# Patient Record
Sex: Female | Born: 1940 | Race: White | Hispanic: No | Marital: Married | Smoking: Current every day smoker
Health system: Southern US, Community
[De-identification: ages and names within clinical notes are randomized; demographics above are authoritative.]

## PROBLEM LIST (undated history)

## (undated) DIAGNOSIS — K635 Polyp of colon: Secondary | ICD-10-CM

## (undated) DIAGNOSIS — H409 Unspecified glaucoma: Secondary | ICD-10-CM

## (undated) DIAGNOSIS — I2510 Atherosclerotic heart disease of native coronary artery without angina pectoris: Secondary | ICD-10-CM

## (undated) DIAGNOSIS — C189 Malignant neoplasm of colon, unspecified: Secondary | ICD-10-CM

## (undated) DIAGNOSIS — I5042 Chronic combined systolic (congestive) and diastolic (congestive) heart failure: Secondary | ICD-10-CM

## (undated) DIAGNOSIS — I6529 Occlusion and stenosis of unspecified carotid artery: Secondary | ICD-10-CM

## (undated) DIAGNOSIS — I480 Paroxysmal atrial fibrillation: Secondary | ICD-10-CM

## (undated) DIAGNOSIS — G8929 Other chronic pain: Secondary | ICD-10-CM

## (undated) DIAGNOSIS — E042 Nontoxic multinodular goiter: Secondary | ICD-10-CM

## (undated) DIAGNOSIS — I82409 Acute embolism and thrombosis of unspecified deep veins of unspecified lower extremity: Secondary | ICD-10-CM

## (undated) DIAGNOSIS — D509 Iron deficiency anemia, unspecified: Secondary | ICD-10-CM

## (undated) DIAGNOSIS — E1142 Type 2 diabetes mellitus with diabetic polyneuropathy: Secondary | ICD-10-CM

## (undated) DIAGNOSIS — Z950 Presence of cardiac pacemaker: Secondary | ICD-10-CM

## (undated) DIAGNOSIS — I10 Essential (primary) hypertension: Secondary | ICD-10-CM

## (undated) DIAGNOSIS — E785 Hyperlipidemia, unspecified: Secondary | ICD-10-CM

## (undated) DIAGNOSIS — Z8619 Personal history of other infectious and parasitic diseases: Secondary | ICD-10-CM

## (undated) DIAGNOSIS — M545 Low back pain, unspecified: Secondary | ICD-10-CM

## (undated) DIAGNOSIS — E119 Type 2 diabetes mellitus without complications: Secondary | ICD-10-CM

## (undated) DIAGNOSIS — M810 Age-related osteoporosis without current pathological fracture: Secondary | ICD-10-CM

## (undated) DIAGNOSIS — G4730 Sleep apnea, unspecified: Secondary | ICD-10-CM

## (undated) DIAGNOSIS — M1990 Unspecified osteoarthritis, unspecified site: Secondary | ICD-10-CM

## (undated) DIAGNOSIS — I471 Supraventricular tachycardia: Secondary | ICD-10-CM

## (undated) DIAGNOSIS — E039 Hypothyroidism, unspecified: Secondary | ICD-10-CM

## (undated) HISTORY — PX: ULNAR TUNNEL RELEASE: SHX820

## (undated) HISTORY — DX: Polyp of colon: K63.5

## (undated) HISTORY — DX: Atherosclerotic heart disease of native coronary artery without angina pectoris: I25.10

## (undated) HISTORY — DX: Type 2 diabetes mellitus with diabetic polyneuropathy: E11.42

## (undated) HISTORY — DX: Unspecified glaucoma: H40.9

## (undated) HISTORY — PX: UMBILICAL HERNIA REPAIR: SHX196

## (undated) HISTORY — PX: EYE MUSCLE SURGERY: SHX370

## (undated) HISTORY — DX: Essential (primary) hypertension: I10

## (undated) HISTORY — DX: Occlusion and stenosis of unspecified carotid artery: I65.29

## (undated) HISTORY — DX: Hyperlipidemia, unspecified: E78.5

## (undated) HISTORY — DX: Age-related osteoporosis without current pathological fracture: M81.0

## (undated) HISTORY — DX: Malignant neoplasm of colon, unspecified: C18.9

---

## 1979-05-01 HISTORY — PX: CHOLECYSTECTOMY: SHX55

## 2000-03-30 ENCOUNTER — Inpatient Hospital Stay (HOSPITAL_COMMUNITY): Admission: EM | Admit: 2000-03-30 | Discharge: 2000-04-01 | Payer: Self-pay | Admitting: Emergency Medicine

## 2000-03-30 ENCOUNTER — Encounter: Payer: Self-pay | Admitting: Emergency Medicine

## 2000-04-01 ENCOUNTER — Encounter: Payer: Self-pay | Admitting: Interventional Cardiology

## 2000-11-01 ENCOUNTER — Encounter: Admission: RE | Admit: 2000-11-01 | Discharge: 2001-01-30 | Payer: Self-pay | Admitting: Internal Medicine

## 2001-02-03 ENCOUNTER — Encounter: Payer: Self-pay | Admitting: Orthopedic Surgery

## 2001-02-03 ENCOUNTER — Ambulatory Visit (HOSPITAL_COMMUNITY): Admission: RE | Admit: 2001-02-03 | Discharge: 2001-02-03 | Payer: Self-pay | Admitting: Orthopedic Surgery

## 2002-11-07 ENCOUNTER — Encounter (HOSPITAL_BASED_OUTPATIENT_CLINIC_OR_DEPARTMENT_OTHER): Admission: RE | Admit: 2002-11-07 | Discharge: 2003-02-05 | Payer: Self-pay | Admitting: Internal Medicine

## 2003-01-25 ENCOUNTER — Encounter: Payer: Self-pay | Admitting: Internal Medicine

## 2003-01-25 ENCOUNTER — Encounter: Admission: RE | Admit: 2003-01-25 | Discharge: 2003-01-25 | Payer: Self-pay | Admitting: Internal Medicine

## 2004-08-30 HISTORY — PX: CARDIAC CATHETERIZATION: SHX172

## 2007-08-31 HISTORY — PX: ROUX-EN-Y GASTRIC BYPASS: SHX1104

## 2010-10-29 HISTORY — PX: COLECTOMY: SHX59

## 2010-11-29 HISTORY — PX: PORTACATH PLACEMENT: SHX2246

## 2011-08-31 DIAGNOSIS — I82409 Acute embolism and thrombosis of unspecified deep veins of unspecified lower extremity: Secondary | ICD-10-CM

## 2011-08-31 HISTORY — PX: CATARACT EXTRACTION, BILATERAL: SHX1313

## 2011-08-31 HISTORY — DX: Acute embolism and thrombosis of unspecified deep veins of unspecified lower extremity: I82.409

## 2011-09-13 DIAGNOSIS — M2010 Hallux valgus (acquired), unspecified foot: Secondary | ICD-10-CM | POA: Diagnosis not present

## 2011-09-13 DIAGNOSIS — M79609 Pain in unspecified limb: Secondary | ICD-10-CM | POA: Diagnosis not present

## 2011-09-13 DIAGNOSIS — IMO0001 Reserved for inherently not codable concepts without codable children: Secondary | ICD-10-CM | POA: Diagnosis not present

## 2011-09-13 DIAGNOSIS — E1149 Type 2 diabetes mellitus with other diabetic neurological complication: Secondary | ICD-10-CM | POA: Diagnosis not present

## 2011-09-13 DIAGNOSIS — M25579 Pain in unspecified ankle and joints of unspecified foot: Secondary | ICD-10-CM | POA: Diagnosis not present

## 2011-09-16 DIAGNOSIS — C185 Malignant neoplasm of splenic flexure: Secondary | ICD-10-CM | POA: Diagnosis not present

## 2011-10-05 DIAGNOSIS — E119 Type 2 diabetes mellitus without complications: Secondary | ICD-10-CM | POA: Diagnosis not present

## 2011-10-05 DIAGNOSIS — E039 Hypothyroidism, unspecified: Secondary | ICD-10-CM | POA: Diagnosis not present

## 2011-10-05 DIAGNOSIS — F329 Major depressive disorder, single episode, unspecified: Secondary | ICD-10-CM | POA: Diagnosis not present

## 2011-10-20 DIAGNOSIS — M79609 Pain in unspecified limb: Secondary | ICD-10-CM | POA: Diagnosis not present

## 2011-10-20 DIAGNOSIS — E119 Type 2 diabetes mellitus without complications: Secondary | ICD-10-CM | POA: Diagnosis not present

## 2011-10-20 DIAGNOSIS — E039 Hypothyroidism, unspecified: Secondary | ICD-10-CM | POA: Diagnosis not present

## 2011-10-20 DIAGNOSIS — I824Y9 Acute embolism and thrombosis of unspecified deep veins of unspecified proximal lower extremity: Secondary | ICD-10-CM | POA: Diagnosis not present

## 2011-10-20 DIAGNOSIS — I2510 Atherosclerotic heart disease of native coronary artery without angina pectoris: Secondary | ICD-10-CM | POA: Diagnosis not present

## 2011-10-20 DIAGNOSIS — M7989 Other specified soft tissue disorders: Secondary | ICD-10-CM | POA: Diagnosis not present

## 2011-10-20 DIAGNOSIS — I82409 Acute embolism and thrombosis of unspecified deep veins of unspecified lower extremity: Secondary | ICD-10-CM | POA: Diagnosis not present

## 2011-10-25 DIAGNOSIS — E785 Hyperlipidemia, unspecified: Secondary | ICD-10-CM | POA: Diagnosis not present

## 2011-10-25 DIAGNOSIS — I82409 Acute embolism and thrombosis of unspecified deep veins of unspecified lower extremity: Secondary | ICD-10-CM | POA: Diagnosis not present

## 2011-10-25 DIAGNOSIS — D649 Anemia, unspecified: Secondary | ICD-10-CM | POA: Diagnosis not present

## 2011-10-28 DIAGNOSIS — D509 Iron deficiency anemia, unspecified: Secondary | ICD-10-CM | POA: Diagnosis not present

## 2011-10-28 DIAGNOSIS — E119 Type 2 diabetes mellitus without complications: Secondary | ICD-10-CM | POA: Diagnosis not present

## 2011-10-28 DIAGNOSIS — Z86718 Personal history of other venous thrombosis and embolism: Secondary | ICD-10-CM | POA: Diagnosis not present

## 2011-10-28 DIAGNOSIS — I82409 Acute embolism and thrombosis of unspecified deep veins of unspecified lower extremity: Secondary | ICD-10-CM | POA: Diagnosis not present

## 2011-11-02 DIAGNOSIS — I82409 Acute embolism and thrombosis of unspecified deep veins of unspecified lower extremity: Secondary | ICD-10-CM | POA: Diagnosis not present

## 2011-11-02 DIAGNOSIS — E785 Hyperlipidemia, unspecified: Secondary | ICD-10-CM | POA: Diagnosis not present

## 2011-11-02 DIAGNOSIS — R252 Cramp and spasm: Secondary | ICD-10-CM | POA: Diagnosis not present

## 2011-11-02 DIAGNOSIS — Z794 Long term (current) use of insulin: Secondary | ICD-10-CM | POA: Diagnosis not present

## 2011-11-02 DIAGNOSIS — E669 Obesity, unspecified: Secondary | ICD-10-CM | POA: Diagnosis not present

## 2011-11-02 DIAGNOSIS — F329 Major depressive disorder, single episode, unspecified: Secondary | ICD-10-CM | POA: Diagnosis not present

## 2011-11-02 DIAGNOSIS — Z9221 Personal history of antineoplastic chemotherapy: Secondary | ICD-10-CM | POA: Diagnosis not present

## 2011-11-02 DIAGNOSIS — E1142 Type 2 diabetes mellitus with diabetic polyneuropathy: Secondary | ICD-10-CM | POA: Diagnosis not present

## 2011-11-02 DIAGNOSIS — M79609 Pain in unspecified limb: Secondary | ICD-10-CM | POA: Diagnosis not present

## 2011-11-02 DIAGNOSIS — Z9884 Bariatric surgery status: Secondary | ICD-10-CM | POA: Diagnosis not present

## 2011-11-02 DIAGNOSIS — Z86718 Personal history of other venous thrombosis and embolism: Secondary | ICD-10-CM | POA: Diagnosis not present

## 2011-11-02 DIAGNOSIS — Z85038 Personal history of other malignant neoplasm of large intestine: Secondary | ICD-10-CM | POA: Diagnosis not present

## 2011-11-02 DIAGNOSIS — Z86010 Personal history of colonic polyps: Secondary | ICD-10-CM | POA: Diagnosis not present

## 2011-11-02 DIAGNOSIS — M1710 Unilateral primary osteoarthritis, unspecified knee: Secondary | ICD-10-CM | POA: Diagnosis not present

## 2011-11-02 DIAGNOSIS — E1149 Type 2 diabetes mellitus with other diabetic neurological complication: Secondary | ICD-10-CM | POA: Diagnosis not present

## 2011-11-02 DIAGNOSIS — H409 Unspecified glaucoma: Secondary | ICD-10-CM | POA: Diagnosis not present

## 2011-11-02 DIAGNOSIS — E039 Hypothyroidism, unspecified: Secondary | ICD-10-CM | POA: Diagnosis not present

## 2011-11-04 DIAGNOSIS — I82409 Acute embolism and thrombosis of unspecified deep veins of unspecified lower extremity: Secondary | ICD-10-CM | POA: Diagnosis not present

## 2011-11-05 DIAGNOSIS — Z1231 Encounter for screening mammogram for malignant neoplasm of breast: Secondary | ICD-10-CM | POA: Diagnosis not present

## 2011-11-09 DIAGNOSIS — C185 Malignant neoplasm of splenic flexure: Secondary | ICD-10-CM | POA: Diagnosis not present

## 2011-11-09 DIAGNOSIS — I82409 Acute embolism and thrombosis of unspecified deep veins of unspecified lower extremity: Secondary | ICD-10-CM | POA: Diagnosis not present

## 2011-11-09 DIAGNOSIS — C189 Malignant neoplasm of colon, unspecified: Secondary | ICD-10-CM | POA: Diagnosis not present

## 2011-11-18 DIAGNOSIS — K6389 Other specified diseases of intestine: Secondary | ICD-10-CM | POA: Diagnosis not present

## 2011-11-18 DIAGNOSIS — D129 Benign neoplasm of anus and anal canal: Secondary | ICD-10-CM | POA: Diagnosis not present

## 2011-11-18 DIAGNOSIS — D128 Benign neoplasm of rectum: Secondary | ICD-10-CM | POA: Diagnosis not present

## 2011-11-18 DIAGNOSIS — K620 Anal polyp: Secondary | ICD-10-CM | POA: Diagnosis not present

## 2011-11-18 DIAGNOSIS — K621 Rectal polyp: Secondary | ICD-10-CM | POA: Diagnosis not present

## 2011-11-18 DIAGNOSIS — Z85038 Personal history of other malignant neoplasm of large intestine: Secondary | ICD-10-CM | POA: Diagnosis not present

## 2011-11-24 DIAGNOSIS — I82409 Acute embolism and thrombosis of unspecified deep veins of unspecified lower extremity: Secondary | ICD-10-CM | POA: Diagnosis not present

## 2011-11-29 DIAGNOSIS — E119 Type 2 diabetes mellitus without complications: Secondary | ICD-10-CM | POA: Diagnosis not present

## 2011-11-29 DIAGNOSIS — D649 Anemia, unspecified: Secondary | ICD-10-CM | POA: Diagnosis not present

## 2011-11-29 DIAGNOSIS — I82409 Acute embolism and thrombosis of unspecified deep veins of unspecified lower extremity: Secondary | ICD-10-CM | POA: Diagnosis not present

## 2011-11-29 DIAGNOSIS — E782 Mixed hyperlipidemia: Secondary | ICD-10-CM | POA: Diagnosis not present

## 2011-11-29 DIAGNOSIS — R5381 Other malaise: Secondary | ICD-10-CM | POA: Diagnosis not present

## 2011-11-29 DIAGNOSIS — R5383 Other fatigue: Secondary | ICD-10-CM | POA: Diagnosis not present

## 2011-12-06 DIAGNOSIS — I82409 Acute embolism and thrombosis of unspecified deep veins of unspecified lower extremity: Secondary | ICD-10-CM | POA: Diagnosis not present

## 2011-12-14 DIAGNOSIS — I82409 Acute embolism and thrombosis of unspecified deep veins of unspecified lower extremity: Secondary | ICD-10-CM | POA: Diagnosis not present

## 2011-12-20 DIAGNOSIS — D649 Anemia, unspecified: Secondary | ICD-10-CM | POA: Diagnosis not present

## 2011-12-20 DIAGNOSIS — K289 Gastrojejunal ulcer, unspecified as acute or chronic, without hemorrhage or perforation: Secondary | ICD-10-CM | POA: Diagnosis not present

## 2011-12-20 DIAGNOSIS — K2960 Other gastritis without bleeding: Secondary | ICD-10-CM | POA: Diagnosis not present

## 2011-12-20 DIAGNOSIS — K5520 Angiodysplasia of colon without hemorrhage: Secondary | ICD-10-CM | POA: Diagnosis not present

## 2011-12-20 DIAGNOSIS — K599 Functional intestinal disorder, unspecified: Secondary | ICD-10-CM | POA: Diagnosis not present

## 2011-12-20 DIAGNOSIS — D509 Iron deficiency anemia, unspecified: Secondary | ICD-10-CM | POA: Diagnosis not present

## 2011-12-23 DIAGNOSIS — E785 Hyperlipidemia, unspecified: Secondary | ICD-10-CM | POA: Diagnosis not present

## 2011-12-23 DIAGNOSIS — I10 Essential (primary) hypertension: Secondary | ICD-10-CM | POA: Diagnosis not present

## 2011-12-23 DIAGNOSIS — E1165 Type 2 diabetes mellitus with hyperglycemia: Secondary | ICD-10-CM | POA: Diagnosis not present

## 2011-12-23 DIAGNOSIS — I2510 Atherosclerotic heart disease of native coronary artery without angina pectoris: Secondary | ICD-10-CM | POA: Diagnosis not present

## 2012-01-27 DIAGNOSIS — R030 Elevated blood-pressure reading, without diagnosis of hypertension: Secondary | ICD-10-CM | POA: Diagnosis not present

## 2012-01-27 DIAGNOSIS — E119 Type 2 diabetes mellitus without complications: Secondary | ICD-10-CM | POA: Diagnosis not present

## 2012-01-27 DIAGNOSIS — E785 Hyperlipidemia, unspecified: Secondary | ICD-10-CM | POA: Diagnosis not present

## 2012-01-27 DIAGNOSIS — D649 Anemia, unspecified: Secondary | ICD-10-CM | POA: Diagnosis not present

## 2012-02-02 DIAGNOSIS — D649 Anemia, unspecified: Secondary | ICD-10-CM | POA: Diagnosis not present

## 2012-02-02 DIAGNOSIS — E119 Type 2 diabetes mellitus without complications: Secondary | ICD-10-CM | POA: Diagnosis not present

## 2012-02-02 DIAGNOSIS — Z7901 Long term (current) use of anticoagulants: Secondary | ICD-10-CM | POA: Diagnosis not present

## 2012-02-04 DIAGNOSIS — C185 Malignant neoplasm of splenic flexure: Secondary | ICD-10-CM | POA: Diagnosis not present

## 2012-02-15 DIAGNOSIS — I82409 Acute embolism and thrombosis of unspecified deep veins of unspecified lower extremity: Secondary | ICD-10-CM | POA: Diagnosis not present

## 2012-02-16 DIAGNOSIS — K5732 Diverticulitis of large intestine without perforation or abscess without bleeding: Secondary | ICD-10-CM | POA: Diagnosis not present

## 2012-02-23 DIAGNOSIS — I82409 Acute embolism and thrombosis of unspecified deep veins of unspecified lower extremity: Secondary | ICD-10-CM | POA: Diagnosis not present

## 2012-03-08 DIAGNOSIS — I82409 Acute embolism and thrombosis of unspecified deep veins of unspecified lower extremity: Secondary | ICD-10-CM | POA: Diagnosis not present

## 2012-03-15 DIAGNOSIS — I82409 Acute embolism and thrombosis of unspecified deep veins of unspecified lower extremity: Secondary | ICD-10-CM | POA: Diagnosis not present

## 2012-03-21 DIAGNOSIS — H2510 Age-related nuclear cataract, unspecified eye: Secondary | ICD-10-CM | POA: Diagnosis not present

## 2012-03-21 DIAGNOSIS — H4010X0 Unspecified open-angle glaucoma, stage unspecified: Secondary | ICD-10-CM | POA: Diagnosis not present

## 2012-03-21 DIAGNOSIS — E109 Type 1 diabetes mellitus without complications: Secondary | ICD-10-CM | POA: Diagnosis not present

## 2012-03-29 DIAGNOSIS — I82409 Acute embolism and thrombosis of unspecified deep veins of unspecified lower extremity: Secondary | ICD-10-CM | POA: Diagnosis not present

## 2012-03-29 DIAGNOSIS — Z5181 Encounter for therapeutic drug level monitoring: Secondary | ICD-10-CM | POA: Diagnosis not present

## 2012-03-30 DIAGNOSIS — I82890 Acute embolism and thrombosis of other specified veins: Secondary | ICD-10-CM | POA: Diagnosis not present

## 2012-03-30 DIAGNOSIS — E119 Type 2 diabetes mellitus without complications: Secondary | ICD-10-CM | POA: Diagnosis not present

## 2012-03-30 DIAGNOSIS — M7989 Other specified soft tissue disorders: Secondary | ICD-10-CM | POA: Diagnosis not present

## 2012-03-30 DIAGNOSIS — I82409 Acute embolism and thrombosis of unspecified deep veins of unspecified lower extremity: Secondary | ICD-10-CM | POA: Diagnosis not present

## 2012-04-03 DIAGNOSIS — I82409 Acute embolism and thrombosis of unspecified deep veins of unspecified lower extremity: Secondary | ICD-10-CM | POA: Diagnosis not present

## 2012-04-03 DIAGNOSIS — M7989 Other specified soft tissue disorders: Secondary | ICD-10-CM | POA: Diagnosis not present

## 2012-04-11 DIAGNOSIS — Z86718 Personal history of other venous thrombosis and embolism: Secondary | ICD-10-CM | POA: Diagnosis not present

## 2012-04-17 DIAGNOSIS — H2510 Age-related nuclear cataract, unspecified eye: Secondary | ICD-10-CM | POA: Diagnosis not present

## 2012-04-17 DIAGNOSIS — E11319 Type 2 diabetes mellitus with unspecified diabetic retinopathy without macular edema: Secondary | ICD-10-CM | POA: Diagnosis not present

## 2012-04-17 DIAGNOSIS — H25049 Posterior subcapsular polar age-related cataract, unspecified eye: Secondary | ICD-10-CM | POA: Diagnosis not present

## 2012-04-19 DIAGNOSIS — Z86718 Personal history of other venous thrombosis and embolism: Secondary | ICD-10-CM | POA: Diagnosis not present

## 2012-04-26 DIAGNOSIS — Z7901 Long term (current) use of anticoagulants: Secondary | ICD-10-CM | POA: Diagnosis not present

## 2012-04-26 DIAGNOSIS — I82409 Acute embolism and thrombosis of unspecified deep veins of unspecified lower extremity: Secondary | ICD-10-CM | POA: Diagnosis not present

## 2012-05-02 DIAGNOSIS — E119 Type 2 diabetes mellitus without complications: Secondary | ICD-10-CM | POA: Diagnosis not present

## 2012-05-02 DIAGNOSIS — H269 Unspecified cataract: Secondary | ICD-10-CM | POA: Diagnosis not present

## 2012-05-02 DIAGNOSIS — E785 Hyperlipidemia, unspecified: Secondary | ICD-10-CM | POA: Diagnosis not present

## 2012-05-02 DIAGNOSIS — D649 Anemia, unspecified: Secondary | ICD-10-CM | POA: Diagnosis not present

## 2012-05-10 DIAGNOSIS — C185 Malignant neoplasm of splenic flexure: Secondary | ICD-10-CM | POA: Diagnosis not present

## 2012-05-10 DIAGNOSIS — C189 Malignant neoplasm of colon, unspecified: Secondary | ICD-10-CM | POA: Diagnosis not present

## 2012-05-10 DIAGNOSIS — D539 Nutritional anemia, unspecified: Secondary | ICD-10-CM | POA: Diagnosis not present

## 2012-05-17 DIAGNOSIS — Z7901 Long term (current) use of anticoagulants: Secondary | ICD-10-CM | POA: Diagnosis not present

## 2012-05-17 DIAGNOSIS — I82409 Acute embolism and thrombosis of unspecified deep veins of unspecified lower extremity: Secondary | ICD-10-CM | POA: Diagnosis not present

## 2012-05-22 DIAGNOSIS — D509 Iron deficiency anemia, unspecified: Secondary | ICD-10-CM | POA: Diagnosis not present

## 2012-06-05 DIAGNOSIS — D509 Iron deficiency anemia, unspecified: Secondary | ICD-10-CM | POA: Diagnosis not present

## 2012-06-05 DIAGNOSIS — Z23 Encounter for immunization: Secondary | ICD-10-CM | POA: Diagnosis not present

## 2012-06-07 DIAGNOSIS — H409 Unspecified glaucoma: Secondary | ICD-10-CM | POA: Diagnosis not present

## 2012-06-07 DIAGNOSIS — H4010X0 Unspecified open-angle glaucoma, stage unspecified: Secondary | ICD-10-CM | POA: Diagnosis not present

## 2012-06-07 DIAGNOSIS — H4011X0 Primary open-angle glaucoma, stage unspecified: Secondary | ICD-10-CM | POA: Diagnosis not present

## 2012-06-07 DIAGNOSIS — H25049 Posterior subcapsular polar age-related cataract, unspecified eye: Secondary | ICD-10-CM | POA: Diagnosis not present

## 2012-06-07 DIAGNOSIS — H25019 Cortical age-related cataract, unspecified eye: Secondary | ICD-10-CM | POA: Diagnosis not present

## 2012-06-07 DIAGNOSIS — H2510 Age-related nuclear cataract, unspecified eye: Secondary | ICD-10-CM | POA: Diagnosis not present

## 2012-06-08 DIAGNOSIS — H2510 Age-related nuclear cataract, unspecified eye: Secondary | ICD-10-CM | POA: Diagnosis not present

## 2012-06-14 DIAGNOSIS — Z86718 Personal history of other venous thrombosis and embolism: Secondary | ICD-10-CM | POA: Diagnosis not present

## 2012-06-19 DIAGNOSIS — D509 Iron deficiency anemia, unspecified: Secondary | ICD-10-CM | POA: Diagnosis not present

## 2012-06-21 DIAGNOSIS — H25049 Posterior subcapsular polar age-related cataract, unspecified eye: Secondary | ICD-10-CM | POA: Diagnosis not present

## 2012-06-21 DIAGNOSIS — H409 Unspecified glaucoma: Secondary | ICD-10-CM | POA: Diagnosis not present

## 2012-06-21 DIAGNOSIS — H4011X0 Primary open-angle glaucoma, stage unspecified: Secondary | ICD-10-CM | POA: Diagnosis not present

## 2012-06-21 DIAGNOSIS — H2510 Age-related nuclear cataract, unspecified eye: Secondary | ICD-10-CM | POA: Diagnosis not present

## 2012-07-03 DIAGNOSIS — D509 Iron deficiency anemia, unspecified: Secondary | ICD-10-CM | POA: Diagnosis not present

## 2012-07-07 DIAGNOSIS — I2510 Atherosclerotic heart disease of native coronary artery without angina pectoris: Secondary | ICD-10-CM | POA: Diagnosis not present

## 2012-07-07 DIAGNOSIS — E1165 Type 2 diabetes mellitus with hyperglycemia: Secondary | ICD-10-CM | POA: Diagnosis not present

## 2012-07-07 DIAGNOSIS — E785 Hyperlipidemia, unspecified: Secondary | ICD-10-CM | POA: Diagnosis not present

## 2012-07-07 DIAGNOSIS — I10 Essential (primary) hypertension: Secondary | ICD-10-CM | POA: Diagnosis not present

## 2012-07-11 DIAGNOSIS — Z86718 Personal history of other venous thrombosis and embolism: Secondary | ICD-10-CM | POA: Diagnosis not present

## 2012-08-09 DIAGNOSIS — Z86718 Personal history of other venous thrombosis and embolism: Secondary | ICD-10-CM | POA: Diagnosis not present

## 2012-09-13 DIAGNOSIS — Z0189 Encounter for other specified special examinations: Secondary | ICD-10-CM | POA: Diagnosis not present

## 2012-09-13 DIAGNOSIS — Z85038 Personal history of other malignant neoplasm of large intestine: Secondary | ICD-10-CM | POA: Diagnosis not present

## 2012-11-03 DIAGNOSIS — D509 Iron deficiency anemia, unspecified: Secondary | ICD-10-CM | POA: Diagnosis not present

## 2012-12-12 DIAGNOSIS — C185 Malignant neoplasm of splenic flexure: Secondary | ICD-10-CM | POA: Diagnosis not present

## 2012-12-12 DIAGNOSIS — C189 Malignant neoplasm of colon, unspecified: Secondary | ICD-10-CM | POA: Diagnosis not present

## 2013-04-11 ENCOUNTER — Telehealth: Payer: Self-pay | Admitting: Oncology

## 2013-04-11 ENCOUNTER — Telehealth: Payer: Self-pay | Admitting: *Deleted

## 2013-04-11 NOTE — Telephone Encounter (Signed)
Spoke with patient by phone and confirmed appointment with Dr. Truett Perna for 04/26/13.  Patient is transferring care from [facility] referral received 04/10/13 pm.  She will bring a copy of her scans with her and other paper medical records she has.  Contact names and phone numbers for [facility] was provided.

## 2013-04-11 NOTE — Telephone Encounter (Signed)
Pt scheduled per Wilburn Mylar.  Welcome packet mailed.

## 2013-04-16 ENCOUNTER — Telehealth: Payer: Self-pay | Admitting: Oncology

## 2013-04-16 NOTE — Telephone Encounter (Signed)
C/D 04/16/13 for appt. 04/26/13 °

## 2013-04-26 ENCOUNTER — Ambulatory Visit (HOSPITAL_BASED_OUTPATIENT_CLINIC_OR_DEPARTMENT_OTHER): Payer: Medicare Other | Admitting: Oncology

## 2013-04-26 ENCOUNTER — Encounter: Payer: Self-pay | Admitting: Oncology

## 2013-04-26 ENCOUNTER — Ambulatory Visit: Payer: Medicare Other

## 2013-04-26 ENCOUNTER — Telehealth: Payer: Self-pay | Admitting: Oncology

## 2013-04-26 ENCOUNTER — Ambulatory Visit (HOSPITAL_BASED_OUTPATIENT_CLINIC_OR_DEPARTMENT_OTHER): Payer: Medicare Other | Admitting: Lab

## 2013-04-26 VITALS — BP 171/79 | HR 64 | Temp 98.3°F | Resp 20 | Ht 67.5 in | Wt 254.1 lb

## 2013-04-26 DIAGNOSIS — Z85038 Personal history of other malignant neoplasm of large intestine: Secondary | ICD-10-CM

## 2013-04-26 DIAGNOSIS — D649 Anemia, unspecified: Secondary | ICD-10-CM

## 2013-04-26 DIAGNOSIS — C189 Malignant neoplasm of colon, unspecified: Secondary | ICD-10-CM | POA: Diagnosis not present

## 2013-04-26 DIAGNOSIS — G609 Hereditary and idiopathic neuropathy, unspecified: Secondary | ICD-10-CM

## 2013-04-26 DIAGNOSIS — C184 Malignant neoplasm of transverse colon: Secondary | ICD-10-CM

## 2013-04-26 DIAGNOSIS — Z86718 Personal history of other venous thrombosis and embolism: Secondary | ICD-10-CM | POA: Diagnosis not present

## 2013-04-26 DIAGNOSIS — Z808 Family history of malignant neoplasm of other organs or systems: Secondary | ICD-10-CM | POA: Diagnosis not present

## 2013-04-26 DIAGNOSIS — E119 Type 2 diabetes mellitus without complications: Secondary | ICD-10-CM

## 2013-04-26 DIAGNOSIS — Z862 Personal history of diseases of the blood and blood-forming organs and certain disorders involving the immune mechanism: Secondary | ICD-10-CM

## 2013-04-26 LAB — CBC WITH DIFFERENTIAL/PLATELET
BASO%: 1 % (ref 0.0–2.0)
Basophils Absolute: 0.1 10*3/uL (ref 0.0–0.1)
EOS%: 1.2 % (ref 0.0–7.0)
Eosinophils Absolute: 0.1 10*3/uL (ref 0.0–0.5)
HCT: 42.2 % (ref 34.8–46.6)
HGB: 14.1 g/dL (ref 11.6–15.9)
LYMPH%: 29.9 % (ref 14.0–49.7)
MCH: 29.6 pg (ref 25.1–34.0)
MCHC: 33.6 g/dL (ref 31.5–36.0)
MCV: 88.3 fL (ref 79.5–101.0)
MONO#: 0.6 10*3/uL (ref 0.1–0.9)
MONO%: 5.7 % (ref 0.0–14.0)
NEUT#: 6.5 10*3/uL (ref 1.5–6.5)
NEUT%: 62.2 % (ref 38.4–76.8)
Platelets: 304 10*3/uL (ref 145–400)
RBC: 4.78 10*6/uL (ref 3.70–5.45)
RDW: 14.7 % — ABNORMAL HIGH (ref 11.2–14.5)
WBC: 10.4 10*3/uL — ABNORMAL HIGH (ref 3.9–10.3)
lymph#: 3.1 10*3/uL (ref 0.9–3.3)

## 2013-04-26 NOTE — Progress Notes (Signed)
[facility] New Patient Consult   Referring MD: Dr. Deatra Robinson   Mary [name] 72 y.o.  [birth year]    Reason for Referral: Colon cancer     HPI: She was diagnosed with colon cancer in 2012 while living in [place]. She recently relocated to [facility] and is referred to continue oncology care.  She underwent a screening colonoscopy on 11/13/2010. A mass was noted at the splenic flexure. She underwent a hand-assisted laparoscopic left colectomy on 11/30/2010. Tumor was noted at the distal transverse colon. No evidence of liver metastases. The pathology confirmed a low-grade adenocarcinoma of the transverse colon measuring 7 cm. Macroscopic tumor perforation was not identified. The resection margins were negative. Lymphovascular and perineural invasion were not identified. 1 tumor deposit was identified. 28 lymph nodes were negative for metastatic carcinoma.  She was treated with adjuvant FOLFOX beginning on 12/23/2010. She completed 12 cycles. She reports oxaliplatin was held after cycle 7 secondary to neuropathy.  She feels well. She last underwent a restaging evaluation in April 2014. CT scan showed no evidence of metastatic disease with a stable left lung nodule. She underwent a surveillance colonoscopy on 11/18/2011. A 4 mm polyp was removed from the rectum. The pathology revealed a tubular adenoma.  Past Medical History  Diagnosis Date  . Colon cancer-stage III   April 2012  . Diabetes mellitus without complication   . Hypertension   . Thyroid disease     hypothyroid  . Anemia     iron deficiency-felt to be secondary to gastric bypass surgery, status post IV iron therapy   . Diabetic peripheral neuropathy   . CAD (coronary artery disease)   . Hyperlipidemia     .  G0 P0   .  Glaucoma   .  Left leg deep vein thrombosis in March of 2013 and October 2013, initial DVT was following trauma to the left leg, she has been maintained off of anticoagulation since  December of 2013  Past Surgical History  Procedure Laterality Date  . Gastric bypass     .     Bilateral cataract surgery-2013  .     Partial colectomy-April 2012  Family history: A brother died of pancreatic cancer dates 48, a brother has prostate and bladder cancer, a sister died of breast cancer at age 48, her mother died of breast cancer at age 79, 2 maternal aunts died of breast cancer, a maternal cousin died of breast cancer, a maternal cousin has prostate cancer, a maternal cousin had ovarian and bladder cancer  No family history of rectal or colon cancer  Current outpatient prescriptions:atorvastatin (LIPITOR) 40 MG tablet, Take 40 mg by mouth daily., Disp: , Rfl: ;  bimatoprost (LUMIGAN) 0.01 % SOLN, Apply 1 drop to eye at bedtime. Both eyes, Disp: , Rfl: ;  buPROPion (WELLBUTRIN SR) 150 MG 12 hr tablet, Take 150 mg by mouth daily., Disp: , Rfl: ;  Calcium Citrate-Vitamin D (CITRACAL + D PO), Take 1 tablet by mouth 2 (two) times daily., Disp: , Rfl:  Cholecalciferol (VITAMIN D3) 10000 UNITS capsule, Take 50,000 Units by mouth daily., Disp: , Rfl: ;  Choline Fenofibrate 135 MG capsule, Take 135 mg by mouth daily., Disp: , Rfl: ;  cyanocobalamin (,VITAMIN B-12,) 1000 MCG/ML injection, Inject 1,000 mcg into the skin every 30 (thirty) days., Disp: , Rfl: ;  ferrous sulfate 325 (65 FE) MG tablet, Take 325 mg by mouth 2 (two) times daily., Disp: , Rfl:  gabapentin (NEURONTIN) 300 MG  capsule, Take 300 mg by mouth 3 (three) times daily., Disp: , Rfl: ;  HYDROcodone-acetaminophen (VICODIN) 5-500 MG per tablet, Take 1 tablet by mouth every 4 (four) hours as needed for pain., Disp: , Rfl: ;  insulin glargine (LANTUS) 100 UNIT/ML injection, Inject into the skin 2 (two) times daily as needed., Disp: , Rfl:  levothyroxine (SYNTHROID, LEVOTHROID) 175 MCG tablet, Take 175 mcg by mouth daily before breakfast., Disp: , Rfl: ;  lisinopril-hydrochlorothiazide (PRINZIDE,ZESTORETIC) 20-12.5 MG per tablet, Take 1  tablet by mouth daily., Disp: , Rfl: ;  metFORMIN (GLUCOPHAGE) 1000 MG tablet, Take 1,000 mg by mouth 2 (two) times daily with a meal., Disp: , Rfl: ;  Multiple Vitamins-Minerals (CENTRUM SILVER PO), Take 1 tablet by mouth daily., Disp: , Rfl:   Allergies:  Allergies  Allergen Reactions  . Nsaids     S/P gastric bypass-told not to take    Social History: She lives with her husband in [place]. She is retired from an office occupation. She quit smoking cigarettes in 2009 and restarted. No alcohol use. No transfusion history. No risk factor for HIV or hepatitis.    ROS:   Positives include: Persistent numbness and pain in the feet, intentional 20 pound weight loss, alternating diarrhea and constipation  A complete ROS was otherwise negative.  Physical Exam:  Blood pressure 171/79, pulse 64, temperature 98.3 F (36.8 C), temperature source Oral, resp. rate 20, height 5' 7.5" (1.715 m), weight 254 lb 1.6 oz (115.259 kg).  HEENT: Oropharynx without visible mass, small area of yellowish discoloration at the left tonsil, neck without mass Lungs: Clear bilaterally Cardiac: Regular rate and rhythm Abdomen: No hepatosplenomegaly, no mass, nontender  Vascular: Chronic stasis change with trace edema at the left greater than right lower leg Lymph nodes: No cervical, supra-clavicular, axillary, or inguinal nodes Neurologic: Alert and oriented, the motor exam appears intact in the upper and lower extremities Skin: No rash, Port-A-Cath site at the left upper chest without erythema or tenderness Musculoskeletal: No spine tenderness   LAB:  CBC  Lab Results  Component Value Date   WBC 10.4* 04/26/2013   HGB 14.1 04/26/2013   HCT 42.2 04/26/2013   MCV 88.3 04/26/2013   PLT 304 04/26/2013   ANC 6.5  CEA pending  Radiology: As per history of present illness    Assessment/Plan:   1. Stage III (pT3,pN1c,M0) adenocarcinoma transverse colon, status post a laparoscopic left colectomy  11/30/2010 -12 cycles of FOLFOX chemotherapy initiated on 12/23/2010, oxaliplatin was held after? Cycle 7 of chemotherapy secondary to neuropathy -Surveillance colonoscopy March 2013, removal of a rectal tubular adenoma -Negative surveillance CT scans 12/12/2012  2. History of iron deficiency anemia secondary gastric bypass surgery, status post Venofer, last given 07/03/2012  3. Gastric bypass surgery in 2009  4. Diabetes  5. Hypertension  6. 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 Extensive family history of cancer   Disposition:   [name] has a history of stage III colon cancer diagnosed in April of 2012. She remains in clinical remission. We will followup on the CEA from today. She will return for an office visit and CEA in 6 months. We will plan for a surveillance CT in April of 2015.  She has a history of iron deficiency anemia. The hemoglobin and MCV are normal today. We will check a CBC when she returns in 6 months.  We will make a  referral to Dr. Felicity Coyer for internal medicine care. We will make arrangements for removal of the Port-A-Cath.  She will be referred to the genetic screening clinic.    Paullette Mckain 04/26/2013, 5:58 PM

## 2013-04-26 NOTE — Telephone Encounter (Signed)
gave pt appt for March 2014 , 1st available for MD, sent pt to labs today

## 2013-04-26 NOTE — Progress Notes (Signed)
Checked in new pt with no financial concerns. °

## 2013-04-26 NOTE — Progress Notes (Signed)
Met with Mary [name]. Explained role of nurse navigator. Educational information provided on colon cancer along with [facility] resource sheet and phone numbers.  Smoking cessation information was provided. [facility] resources provided to patient, including SW service information.  Contact names and phone numbers were provided.  No barriers to care identified.  Will continue to follow as needed.

## 2013-04-28 ENCOUNTER — Encounter: Payer: Self-pay | Admitting: Oncology

## 2013-05-01 ENCOUNTER — Encounter: Payer: Self-pay | Admitting: Oncology

## 2013-05-02 ENCOUNTER — Encounter: Payer: Self-pay | Admitting: Oncology

## 2013-05-02 ENCOUNTER — Encounter (HOSPITAL_COMMUNITY): Payer: Self-pay | Admitting: Pharmacy Technician

## 2013-05-02 ENCOUNTER — Other Ambulatory Visit: Payer: Self-pay | Admitting: Radiology

## 2013-05-03 ENCOUNTER — Telehealth: Payer: Self-pay | Admitting: *Deleted

## 2013-05-03 NOTE — Telephone Encounter (Signed)
Called patient to clarify her message about genetic counseling appointment.  Mary [name] says "she forgot to type NOT in her message.  I'm living on social security and can't afford it.  I also know you can't be turned down for pre-existing conditions but know  There will be a way for insurance to turn things down."  Does not want any mention of counseling or genetic testing in her records.  Will notify providers to cancel this request.

## 2013-05-04 ENCOUNTER — Ambulatory Visit (HOSPITAL_COMMUNITY)
Admission: RE | Admit: 2013-05-04 | Discharge: 2013-05-04 | Disposition: A | Discharge status: Home / Self Care | Payer: Medicare Other | Source: Ambulatory Visit | Attending: Oncology | Admitting: Oncology

## 2013-05-04 ENCOUNTER — Encounter (HOSPITAL_COMMUNITY): Payer: Self-pay | Admitting: *Deleted

## 2013-05-04 ENCOUNTER — Encounter (HOSPITAL_COMMUNITY): Payer: Self-pay

## 2013-05-04 ENCOUNTER — Inpatient Hospital Stay (HOSPITAL_COMMUNITY)
Admission: AD | Admit: 2013-05-04 | Discharge: 2013-05-06 | DRG: 309 | Disposition: A | Discharge status: Home / Self Care | Payer: Medicare Other | Source: Ambulatory Visit | Attending: Interventional Cardiology | Admitting: Interventional Cardiology

## 2013-05-04 DIAGNOSIS — Z79899 Other long term (current) drug therapy: Secondary | ICD-10-CM

## 2013-05-04 DIAGNOSIS — I25118 Atherosclerotic heart disease of native coronary artery with other forms of angina pectoris: Secondary | ICD-10-CM | POA: Diagnosis present

## 2013-05-04 DIAGNOSIS — I2510 Atherosclerotic heart disease of native coronary artery without angina pectoris: Secondary | ICD-10-CM

## 2013-05-04 DIAGNOSIS — E119 Type 2 diabetes mellitus without complications: Secondary | ICD-10-CM | POA: Diagnosis not present

## 2013-05-04 DIAGNOSIS — Z86718 Personal history of other venous thrombosis and embolism: Secondary | ICD-10-CM | POA: Diagnosis not present

## 2013-05-04 DIAGNOSIS — Z85038 Personal history of other malignant neoplasm of large intestine: Secondary | ICD-10-CM | POA: Diagnosis not present

## 2013-05-04 DIAGNOSIS — F17200 Nicotine dependence, unspecified, uncomplicated: Secondary | ICD-10-CM | POA: Diagnosis present

## 2013-05-04 DIAGNOSIS — Z9884 Bariatric surgery status: Secondary | ICD-10-CM | POA: Diagnosis not present

## 2013-05-04 DIAGNOSIS — Z794 Long term (current) use of insulin: Secondary | ICD-10-CM

## 2013-05-04 DIAGNOSIS — I498 Other specified cardiac arrhythmias: Secondary | ICD-10-CM | POA: Diagnosis present

## 2013-05-04 DIAGNOSIS — C189 Malignant neoplasm of colon, unspecified: Secondary | ICD-10-CM

## 2013-05-04 DIAGNOSIS — R946 Abnormal results of thyroid function studies: Secondary | ICD-10-CM | POA: Diagnosis present

## 2013-05-04 DIAGNOSIS — E1149 Type 2 diabetes mellitus with other diabetic neurological complication: Secondary | ICD-10-CM | POA: Diagnosis present

## 2013-05-04 DIAGNOSIS — E1169 Type 2 diabetes mellitus with other specified complication: Secondary | ICD-10-CM | POA: Diagnosis present

## 2013-05-04 DIAGNOSIS — D649 Anemia, unspecified: Secondary | ICD-10-CM

## 2013-05-04 DIAGNOSIS — E669 Obesity, unspecified: Secondary | ICD-10-CM | POA: Diagnosis present

## 2013-05-04 DIAGNOSIS — E079 Disorder of thyroid, unspecified: Secondary | ICD-10-CM | POA: Diagnosis not present

## 2013-05-04 DIAGNOSIS — I10 Essential (primary) hypertension: Secondary | ICD-10-CM | POA: Diagnosis not present

## 2013-05-04 DIAGNOSIS — E1142 Type 2 diabetes mellitus with diabetic polyneuropathy: Secondary | ICD-10-CM | POA: Diagnosis not present

## 2013-05-04 DIAGNOSIS — Z9221 Personal history of antineoplastic chemotherapy: Secondary | ICD-10-CM

## 2013-05-04 DIAGNOSIS — E118 Type 2 diabetes mellitus with unspecified complications: Secondary | ICD-10-CM | POA: Diagnosis present

## 2013-05-04 DIAGNOSIS — E785 Hyperlipidemia, unspecified: Secondary | ICD-10-CM | POA: Diagnosis not present

## 2013-05-04 DIAGNOSIS — E039 Hypothyroidism, unspecified: Secondary | ICD-10-CM | POA: Diagnosis present

## 2013-05-04 DIAGNOSIS — Z6841 Body Mass Index (BMI) 40.0 and over, adult: Secondary | ICD-10-CM

## 2013-05-04 DIAGNOSIS — I517 Cardiomegaly: Secondary | ICD-10-CM | POA: Diagnosis not present

## 2013-05-04 DIAGNOSIS — I441 Atrioventricular block, second degree: Secondary | ICD-10-CM | POA: Diagnosis not present

## 2013-05-04 DIAGNOSIS — R42 Dizziness and giddiness: Secondary | ICD-10-CM

## 2013-05-04 LAB — PROTIME-INR
INR: 0.97 (ref 0.00–1.49)
Prothrombin Time: 12.7 seconds (ref 11.6–15.2)

## 2013-05-04 LAB — COMPREHENSIVE METABOLIC PANEL
Albumin: 3.2 g/dL — ABNORMAL LOW (ref 3.5–5.2)
Alkaline Phosphatase: 70 U/L (ref 39–117)
BUN: 26 mg/dL — ABNORMAL HIGH (ref 6–23)
Chloride: 99 mEq/L (ref 96–112)
Creatinine, Ser: 0.96 mg/dL (ref 0.50–1.10)
GFR calc Af Amer: 67 mL/min — ABNORMAL LOW (ref >90)
GFR calc non Af Amer: 58 mL/min — ABNORMAL LOW (ref >90)
Glucose, Bld: 205 mg/dL — ABNORMAL HIGH (ref 70–99)
Total Bilirubin: 0.3 mg/dL (ref 0.3–1.2)

## 2013-05-04 LAB — GLUCOSE, CAPILLARY
Glucose-Capillary: 191 mg/dL — ABNORMAL HIGH (ref 70–99)
Glucose-Capillary: 267 mg/dL — ABNORMAL HIGH (ref 70–99)

## 2013-05-04 LAB — MAGNESIUM: Magnesium: 1.7 mg/dL (ref 1.5–2.5)

## 2013-05-04 LAB — TSH: TSH: 6.365 u[IU]/mL — ABNORMAL HIGH (ref 0.350–4.500)

## 2013-05-04 LAB — CBC
Hemoglobin: 14.3 g/dL (ref 12.0–15.0)
RBC: 4.83 MIL/uL (ref 3.87–5.11)
WBC: 10 10*3/uL (ref 4.0–10.5)

## 2013-05-04 LAB — TROPONIN I: Troponin I: <0.3 ng/mL (ref <0.30)

## 2013-05-04 LAB — APTT: aPTT: 31 seconds (ref 24–37)

## 2013-05-04 MED ORDER — HYDROCODONE-ACETAMINOPHEN 5-325 MG PO TABS: 1.0000 | ORAL_TABLET | ORAL | Status: DC | PRN

## 2013-05-04 MED ORDER — INSULIN GLARGINE 100 UNIT/ML ~~LOC~~ SOLN
12.0000 [IU] | Freq: Two times a day (BID) | SUBCUTANEOUS | Status: DC
  Administered 2013-05-04 – 2013-05-06 (×4): 12 [IU] via SUBCUTANEOUS
  Filled 2013-05-04 (×5): qty 0.12

## 2013-05-04 MED ORDER — LISINOPRIL-HYDROCHLOROTHIAZIDE 20-12.5 MG PO TABS: 1.0000 | ORAL_TABLET | Freq: Every morning | ORAL | Status: DC

## 2013-05-04 MED ORDER — ACETAMINOPHEN 325 MG PO TABS: 650.0000 mg | ORAL_TABLET | ORAL | Status: DC | PRN

## 2013-05-04 MED ORDER — ADULT MULTIVITAMIN W/MINERALS CH
1.0000 | ORAL_TABLET | Freq: Every day | ORAL | Status: DC
  Administered 2013-05-04 – 2013-05-06 (×3): 1 via ORAL
  Filled 2013-05-04 (×3): qty 1

## 2013-05-04 MED ORDER — NITROGLYCERIN 0.4 MG SL SUBL: 0.4000 mg | SUBLINGUAL_TABLET | SUBLINGUAL | Status: DC | PRN

## 2013-05-04 MED ORDER — LATANOPROST 0.005 % OP SOLN
1.0000 [drp] | Freq: Every day | OPHTHALMIC | Status: DC
  Administered 2013-05-04 – 2013-05-05 (×2): 1 [drp] via OPHTHALMIC
  Filled 2013-05-04: qty 2.5

## 2013-05-04 MED ORDER — SODIUM CHLORIDE 0.9 % IV SOLN: 250.0000 mL | INTRAVENOUS | Status: DC | PRN

## 2013-05-04 MED ORDER — ZOLPIDEM TARTRATE 5 MG PO TABS: 5.0000 mg | ORAL_TABLET | Freq: Every evening | ORAL | Status: DC | PRN

## 2013-05-04 MED ORDER — INSULIN ASPART 100 UNIT/ML ~~LOC~~ SOLN
0.0000 [IU] | Freq: Three times a day (TID) | SUBCUTANEOUS | Status: DC
  Administered 2013-05-04: 8 [IU] via SUBCUTANEOUS
  Administered 2013-05-05: 5 [IU] via SUBCUTANEOUS
  Administered 2013-05-06: 2 [IU] via SUBCUTANEOUS

## 2013-05-04 MED ORDER — ALPRAZOLAM 0.25 MG PO TABS: 0.2500 mg | ORAL_TABLET | Freq: Two times a day (BID) | ORAL | Status: DC | PRN

## 2013-05-04 MED ORDER — FERROUS SULFATE 325 (65 FE) MG PO TABS
325.0000 mg | ORAL_TABLET | Freq: Two times a day (BID) | ORAL | Status: DC
  Administered 2013-05-04 – 2013-05-06 (×4): 325 mg via ORAL
  Filled 2013-05-04 (×6): qty 1

## 2013-05-04 MED ORDER — CEFAZOLIN SODIUM-DEXTROSE 2-3 GM-% IV SOLR
2.0000 g | INTRAVENOUS | Status: DC
  Filled 2013-05-04: qty 50

## 2013-05-04 MED ORDER — ENOXAPARIN SODIUM 40 MG/0.4ML ~~LOC~~ SOLN: 40.0000 mg | SUBCUTANEOUS | Status: DC

## 2013-05-04 MED ORDER — FENOFIBRATE 160 MG PO TABS
160.0000 mg | ORAL_TABLET | Freq: Every day | ORAL | Status: DC
  Administered 2013-05-04 – 2013-05-06 (×3): 160 mg via ORAL
  Filled 2013-05-04 (×3): qty 1

## 2013-05-04 MED ORDER — SODIUM CHLORIDE 0.9 % IJ SOLN
3.0000 mL | Freq: Two times a day (BID) | INTRAMUSCULAR | Status: DC
  Administered 2013-05-04: 3 mL via INTRAVENOUS

## 2013-05-04 MED ORDER — ONDANSETRON HCL 4 MG/2ML IJ SOLN: 4.0000 mg | Freq: Four times a day (QID) | INTRAMUSCULAR | Status: DC | PRN

## 2013-05-04 MED ORDER — FENTANYL CITRATE 0.05 MG/ML IJ SOLN
INTRAMUSCULAR | Status: AC
  Filled 2013-05-04: qty 2

## 2013-05-04 MED ORDER — SODIUM CHLORIDE 0.9 % IJ SOLN: 3.0000 mL | INTRAMUSCULAR | Status: DC | PRN

## 2013-05-04 MED ORDER — LISINOPRIL 20 MG PO TABS
20.0000 mg | ORAL_TABLET | Freq: Every day | ORAL | Status: DC
  Administered 2013-05-04 – 2013-05-06 (×3): 20 mg via ORAL
  Filled 2013-05-04 (×3): qty 1

## 2013-05-04 MED ORDER — HYDROCHLOROTHIAZIDE 12.5 MG PO CAPS
12.5000 mg | ORAL_CAPSULE | Freq: Every day | ORAL | Status: DC
  Administered 2013-05-04 – 2013-05-06 (×3): 12.5 mg via ORAL
  Filled 2013-05-04 (×3): qty 1

## 2013-05-04 MED ORDER — ENOXAPARIN SODIUM 40 MG/0.4ML ~~LOC~~ SOLN
40.0000 mg | SUBCUTANEOUS | Status: DC
  Filled 2013-05-04: qty 0.4

## 2013-05-04 MED ORDER — ATORVASTATIN CALCIUM 40 MG PO TABS
40.0000 mg | ORAL_TABLET | Freq: Every day | ORAL | Status: DC
  Administered 2013-05-04 – 2013-05-05 (×2): 40 mg via ORAL
  Filled 2013-05-04 (×3): qty 1

## 2013-05-04 MED ORDER — GABAPENTIN 300 MG PO CAPS
300.0000 mg | ORAL_CAPSULE | Freq: Three times a day (TID) | ORAL | Status: DC
  Administered 2013-05-04 – 2013-05-06 (×7): 300 mg via ORAL
  Filled 2013-05-04 (×8): qty 1

## 2013-05-04 MED ORDER — SODIUM CHLORIDE 0.9 % IV SOLN
INTRAVENOUS | Status: DC
  Administered 2013-05-04: 20 mL/h via INTRAVENOUS

## 2013-05-04 MED ORDER — SODIUM CHLORIDE 0.9 % IJ SOLN
3.0000 mL | Freq: Two times a day (BID) | INTRAMUSCULAR | Status: DC
  Administered 2013-05-04 – 2013-05-06 (×4): 3 mL via INTRAVENOUS

## 2013-05-04 MED ORDER — METFORMIN HCL 500 MG PO TABS
1000.0000 mg | ORAL_TABLET | Freq: Every day | ORAL | Status: DC
  Administered 2013-05-05 – 2013-05-06 (×2): 1000 mg via ORAL
  Filled 2013-05-04 (×4): qty 2

## 2013-05-04 MED ORDER — BUPROPION HCL ER (SR) 150 MG PO TB12
150.0000 mg | ORAL_TABLET | Freq: Every morning | ORAL | Status: DC
  Administered 2013-05-04 – 2013-05-06 (×3): 150 mg via ORAL
  Filled 2013-05-04 (×3): qty 1

## 2013-05-04 MED ORDER — MIDAZOLAM HCL 2 MG/2ML IJ SOLN
INTRAMUSCULAR | Status: AC
  Filled 2013-05-04: qty 4

## 2013-05-04 MED ORDER — LEVOTHYROXINE SODIUM 175 MCG PO TABS
175.0000 ug | ORAL_TABLET | Freq: Every day | ORAL | Status: DC
  Administered 2013-05-05 – 2013-05-06 (×2): 175 ug via ORAL
  Filled 2013-05-04 (×4): qty 1

## 2013-05-04 NOTE — Progress Notes (Signed)
Pt returned to room from Radiology. . It was reported to me that procedure was canceled due to observed heart block on monitor. Pt on stretcher . Warm and dry color pale denies and chest pain shortness of breath . EKG done .

## 2013-05-04 NOTE — H&P (Signed)
Agree 

## 2013-05-04 NOTE — Progress Notes (Signed)
Received call from CCM, Pt sustained at HR 35 to 38, MD Paged and notified, BP 130/42, Pulse 46. No new orders at this time. Will continue to monitor.

## 2013-05-04 NOTE — Progress Notes (Signed)
Report called to Zannie Cove RN, pt. Taken to [id number] via stretcher.

## 2013-05-04 NOTE — Progress Notes (Signed)
Patient ID: Rolla Plate, female   DOB: 02-Aug-1941, 72 y.o.   MRN: [id number]  Patient brought to IR for scheduled elective removal of a left chest porta-cath.  When put on monitor, evident that the patient is bradycardic with HR 30's-50's with some type of heart block evident.  Patient asymptomatic, but has a history of medically managed CAD.  Plan: Discussed with Dr. Truett Perna.  Will cancel port removal today to avoid IV sedation and stress of a procedure.  Will obtain 12-lead ECG and consult Cardiology.

## 2013-05-04 NOTE — Progress Notes (Signed)
Pt seen by Dr Dina Rich and Carma Lair PA while in short stay and orders obtained for admission to telemetry for observation

## 2013-05-04 NOTE — H&P (Signed)
Mary [name] is an 72 y.o. female.   Chief Complaint: "I'm getting my port a cath out" HPI: Patient with history of stage 3 colon carcinoma 2012, currently in remission,  presents today for left chest wall port a cath removal.  Past Medical History  Diagnosis Date  . Colon cancer   . Diabetes mellitus without complication   . Hypertension   . Thyroid disease     hypothyroid  . Anemia     iron deficiency  . Diabetic peripheral neuropathy   . CAD (coronary artery disease)   . Hyperlipidemia     Past Surgical History  Procedure Laterality Date  . Gastric bypass      History reviewed. No pertinent family history. Social History:  reports that she has been smoking Cigarettes.  She has a 10 pack-year smoking history. She uses smokeless tobacco. Her alcohol and drug histories are not on file.  Allergies:  Allergies  Allergen Reactions  . Other Other (See Comments)    NO Blind Scopes with Naso Gastric tube.  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 Nsaids     S/P gastric bypass-told not to take    Current outpatient prescriptions:atorvastatin (LIPITOR) 40 MG tablet, Take 40 mg by mouth every morning. , Disp: , Rfl: ;  buPROPion (WELLBUTRIN SR) 150 MG 12 hr tablet, Take 150 mg by mouth every morning. , Disp: , Rfl: ;  Calcium Citrate-Vitamin D (CITRACAL + D PO), Take 1 tablet by mouth 2 (two) times daily., Disp: , Rfl: ;  Choline Fenofibrate 135 MG capsule, Take 135 mg by mouth daily., Disp: , Rfl:  ferrous sulfate 325 (65 FE) MG tablet, Take 325 mg by mouth 2 (two) times daily., Disp: , Rfl: ;  gabapentin (NEURONTIN) 300 MG capsule, Take 300 mg by mouth 3 (three) times daily., Disp: , Rfl: ;  HYDROcodone-acetaminophen (VICODIN) 5-500 MG per tablet, Take 1 tablet by mouth every 4 (four) hours as needed for pain., Disp: , Rfl:  insulin glargine (LANTUS) 100 UNIT/ML injection, Inject 12 Units into the skin 2 (two) times daily as needed (only if sugar is above 130). , Disp: , Rfl: ;  levothyroxine  (SYNTHROID, LEVOTHROID) 175 MCG tablet, Take 175 mcg by mouth daily before breakfast., Disp: , Rfl: ;  lisinopril-hydrochlorothiazide (PRINZIDE,ZESTORETIC) 20-12.5 MG per tablet, Take 1 tablet by mouth every morning. , Disp: , Rfl:  metFORMIN (GLUCOPHAGE) 1000 MG tablet, Take 1,000 mg by mouth daily with breakfast. , Disp: , Rfl: ;  Multiple Vitamin (MULTIVITAMIN WITH MINERALS) TABS tablet, Take 1 tablet by mouth daily., Disp: , Rfl: ;  bimatoprost (LUMIGAN) 0.01 % SOLN, Apply 1 drop to eye at bedtime. Both eyes, Disp: , Rfl: ;  cyanocobalamin (,VITAMIN B-12,) 1000 MCG/ML injection, Inject 1,000 mcg into the skin every 30 (thirty) days., Disp: , Rfl:  Vitamin D, Ergocalciferol, (DRISDOL) 50000 UNITS CAPS capsule, Take 50,000 Units by mouth every 7 (seven) days., Disp: , Rfl:  Current facility-administered medications:0.9 %  sodium chloride infusion, , Intravenous, Continuous, D Kevin Britanni Yarde, PA-C, Last Rate: 20 mL/hr at 05/04/13 1018, 20 mL/hr at 05/04/13 1018;  ceFAZolin (ANCEF) IVPB 2 g/50 mL premix, 2 g, Intravenous, On Call, D Kevin Erminio Nygard, PA-C;  fentaNYL (SUBLIMAZE) 0.05 MG/ML injection, , , , ;  fentaNYL (SUBLIMAZE) 0.05 MG/ML injection, , , , ;  midazolam (VERSED) 2 MG/2ML injection, , , ,    Results for orders placed during the hospital encounter of 05/04/13 (from the past 48 hour(s))  APTT  Status: None   Collection Time    05/04/13 10:10 AM      Result Value Range   aPTT 31  24 - 37 seconds  CBC     Status: None   Collection Time    05/04/13 10:10 AM      Result Value Range   WBC 10.0  4.0 - 10.5 K/uL   RBC 4.83  3.87 - 5.11 MIL/uL   Hemoglobin 14.3  12.0 - 15.0 g/dL   HCT 16.1  09.6 - 04.5 %   MCV 89.0  78.0 - 100.0 fL   MCH 29.6  26.0 - 34.0 pg   MCHC 33.3  30.0 - 36.0 g/dL   RDW 40.9  81.1 - 91.4 %   Platelets 299  150 - 400 K/uL  PROTIME-INR     Status: None   Collection Time    05/04/13 10:10 AM      Result Value Range   Prothrombin Time 12.7  11.6 - 15.2 seconds    INR 0.97  0.00 - 1.49   No results found.  Review of Systems  Constitutional: Positive for weight loss. Negative for fever and chills.  HENT:       Occ HA's  Respiratory: Negative for cough and shortness of breath.   Cardiovascular: Negative for chest pain.  Gastrointestinal: Negative for nausea, vomiting and abdominal pain.  Musculoskeletal: Positive for joint pain.  Neurological:       Bilat LE neuropathy    Blood pressure 155/73, pulse 53, temperature 97.4 F (36.3 C), temperature source Oral, resp. rate 18, height 5' 7.5" (1.715 m), weight 254 lb (115.214 kg). Physical Exam  Constitutional: She is oriented to person, place, and time. She appears well-developed and well-nourished.  Cardiovascular:  Bradycardic with occ pause note  Respiratory: Effort normal.  occ insp wheeze; clean intact left chest wall PAC  GI: Soft. Bowel sounds are normal. There is no tenderness.  Musculoskeletal: Normal range of motion. She exhibits edema.  Neurological: She is alert and oriented to person, place, and time.     Assessment/Plan Pt with hx of stage 3 colon carcinoma 2012, currently in clinical remission and existing left chest wall PAC (placed in [place]). Plan is for port a cath removal today. Details/risks of procedure d/w pt with her understanding and consent.  Alexsandro Salek,D KEVIN 05/04/2013, 10:43 AM

## 2013-05-04 NOTE — H&P (Signed)
History and Physical   Patient ID: Mary [name] MRN: [id number], DOB/AGE: July 23, 1941 72 y.o. Date of Encounter: 05/04/2013  Primary Physician: No PCP Per Patient Primary Cardiologist: New, was Dr. Katrinka Blazing in the distant past   Chief Complaint:  Bradycardia  HPI: Mary [name] is a 72 y.o. female with a history of non-obstructive CAD by remote cath, colon CA, DM, HTN, HL, and hypothyroidism. She has been living in [place], moving back here about a month ago and thus we do not have all her records. She presented for elective removal of her port a cath she had been using previously for chemotherapy. Upon presentation, she was found to be bradycardic to the 40s and 50s, bp 155/73. The procedure was postponed and cardiology was called to evaluate the patient.  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 Telemetry strips show the same findings.   She reports feeling tired, but attributed it to recently moving. She has not been more dyspneic with exertion than usual (baseline activity level is walking about 50 feet before she has to stop for breath). She has not gotten chest pain with activity since 2013. No palpitations since 2013.   She has been having problems with dizziness for several years, approx since 2010 she reports. She has not had syncope, but has fallen and now uses a cane. She describes problems with changing position and with walking, but not at rest. She describes feeling off-balance, but no room-spinning or feeling she will lose consciousness.   Dr. Truett Perna follows her for her colon cancer, she has completed chemotherapy and surgery, and is not currently scheduled to undergo any additional treatments. She is to follow up with Dr. Truett Perna in 2015.    Past Medical History  Diagnosis Date  . Colon cancer   . Diabetes mellitus without complication   . Hypertension   . Thyroid disease     hypothyroid   . Anemia     iron deficiency  . Diabetic peripheral neuropathy   . CAD (coronary artery disease)   . Hyperlipidemia     Surgical History:  Past Surgical History  Procedure Laterality Date  . Gastric bypass    . Colon surgery  2012    Tumor removal  . Cardiac catheterization  2006    Never had PCI, 3 caths total. Last one in [place]     I have reviewed the patient's current medications. Prior to Admission medications   Medication Sig Start Date End Date Taking? Authorizing Provider  atorvastatin (LIPITOR) 40 MG tablet Take 40 mg by mouth every morning.     Historical Provider, MD  bimatoprost (LUMIGAN) 0.01 % SOLN Apply 1 drop to eye at bedtime. Both eyes    Historical Provider, MD  buPROPion (WELLBUTRIN SR) 150 MG 12 hr tablet Take 150 mg by mouth every morning.     Historical Provider, MD  Calcium Citrate-Vitamin D (CITRACAL + D PO) Take 1 tablet by mouth 2 (two) times daily.    Historical Provider, MD  Choline Fenofibrate 135 MG capsule Take 135 mg by mouth daily.    Historical Provider, MD  cyanocobalamin (,VITAMIN B-12,) 1000 MCG/ML injection Inject 1,000 mcg into the skin every 30 (thirty) days.    Historical Provider, MD  ferrous sulfate 325 (65 FE) MG tablet Take 325 mg by mouth 2 (two) times daily.    Historical Provider, MD  gabapentin (NEURONTIN) 300 MG capsule Take  300 mg by mouth 3 (three) times daily.    Historical Provider, MD  HYDROcodone-acetaminophen (VICODIN) 5-500 MG per tablet Take 1 tablet by mouth every 4 (four) hours as needed for pain.    Historical Provider, MD  insulin glargine (LANTUS) 100 UNIT/ML injection Inject 12 Units into the skin 2 (two) times daily as needed (only if sugar is above 130).     Historical Provider, MD  levothyroxine (SYNTHROID, LEVOTHROID) 175 MCG tablet Take 175 mcg by mouth daily before breakfast.    Historical Provider, MD  lisinopril-hydrochlorothiazide (PRINZIDE,ZESTORETIC) 20-12.5 MG per tablet Take 1 tablet by mouth every  morning.     Historical Provider, MD  metFORMIN (GLUCOPHAGE) 1000 MG tablet Take 1,000 mg by mouth daily with breakfast.     Historical Provider, MD  Multiple Vitamin (MULTIVITAMIN WITH MINERALS) TABS tablet Take 1 tablet by mouth daily.    Historical Provider, MD  Vitamin D, Ergocalciferol, (DRISDOL) 50000 UNITS CAPS capsule Take 50,000 Units by mouth every 7 (seven) days.    Historical Provider, MD   Scheduled Meds: Continuous Infusions: PRN Meds:.    Allergies:  Allergies  Allergen Reactions  . Other Other (See Comments)    NO Blind Scopes with Naso Gastric tube.  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 Nsaids     S/P gastric bypass-told not to take    History   Social History  . Marital Status: Married    Spouse Name: Chrissie Noa    Number of Children: 0  . Years of Education: N/A   Occupational History  . Retired     office work   Social History Main Topics  . Smoking status: Current Every Day Smoker -- 0.50 packs/day for 20 years    Types: Cigarettes  . Smokeless tobacco: Current User  . Alcohol Use: Not on file  . Drug Use: Not on file  . Sexual Activity: Not on file   Other Topics Concern  . Not on file   Social History Narrative   Married to husband, Chrissie Noa   No children   Retired Hospital doctor to Electrical engineer   Recently moved back here from [place]     No family history on file. Family Status  Relation Status Death Age  . Mother Deceased 88    Breast CA  . Father Deceased 40    Died of an MI  . Sister Deceased 57    Died of an MI  . Brother Alive     Hx MI x 2    Review of Systems:  She describes neuropathy. Full 14-point review of systems otherwise negative except as noted above.  Physical Exam:  Blood pressure 158/75, pulse 41, resp. rate 20, SpO2 92.00%.  General: Well developed, well nourished, female in no acute distress  Head: Eyes PERRLA, No xanthomas. Normocephalic and atraumatic, oropharynx without edema or exudate.  Dentition: good Lungs: CTA bilaterally except for a few rales. Heart: Heart irregular rate and rhythm with S1, S2; soft systolic murmur. pulses are 2+ all 4 extrem.  Neck: No carotid bruits. No lymphadenopathy. JVD not elevated.  Abdomen: Bowel sounds present, abdomen soft and non-tender without masses or hernias noted.  Msk: No spine or cva tenderness. No weakness, no joint deformities or effusions.  Extremities: No clubbing or cyanosis. trace edema.  Neuro: Alert and oriented X 3. No focal deficits noted.  Psych: Good affect, responds appropriately  Skin: No rashes or lesions noted.   Labs:  Lab Results   Component  Value  Date    WBC  10.0  05/04/2013    HGB  14.3  05/04/2013    HCT  43.0  05/04/2013    MCV  89.0  05/04/2013    PLT  299  05/04/2013     Recent Labs   05/04/13 1010   INR  0.97    ECG: Mobitz I, 2nd degree heart block   Radiology: No results found.   ASSESSMENT AND PLAN: The patient was seen today by Dr. Wyline Mood, the patient evaluated and the data reviewed.   Principal Problem:   Mobitz type 1 second degree atrioventricular block - difficult to determine level of symptomatology due to heart rate with pt baseline weakness and recent social/physical stressors from the move. Will admit overnight to telemetry. Follow on the monitor. Have staff ambulate patient and see if HR increases appropriately with ambulation. Make sure she is able to maintain O2 sats with exertion. Check a TSH, check an echo. She is not on rate-lowering medications, continue home meds. If all is OK, possible d/c home tomorrow, to follow up in the office. MD advise on event monitor.   Otherwise, see PMH, continue home meds. She has a history of DVT, so will put on DVT Lovenox.    Signed:  Theodore Demark, PA-C  05/04/2013  1:04 PM  Beeper [phone]  Co-Sign MD   Attending Note Ms Maruyama is a 72 yo female w/ a history of colon CA s/p chemotherapy, remote unclear history of non-obstructive CAD per her  reprot, DM, and hypothyroidism who presented today for elective port removal. Upon evaluation but IR, she was found to be bradycardic to the 30s-50s per there report. Cardiology was called to evaluate. EKG shows sinus rhythm (p-waves best seen V1) w/ mobitz I block (ventricular rate 40s), and probable LAFB. She describes vague symptoms of dizziness and fatigue that seemed to have become worst since her recent move. Denies any chest pain, palpitations, or syncope. She is on no AV nodal blocking agents.  Its unclear if her bradycardia may be associated with her symptoms and how long she has had this bradycardia. She will be admitted for observation and telemetry to monitor for any high grade block or progression of bradycardia, we will check her thyroid function and basic labs, and check an echocardiogram. Pendings results, she may be discharged home tomorrow for outpatient follow up.   Dina Rich MD

## 2013-05-04 NOTE — Progress Notes (Signed)
Echo Lab  2D Echocardiogram completed.  Davina Howlett L Toria Monte, RDCS 05/04/2013 3:15 PM

## 2013-05-05 ENCOUNTER — Encounter (HOSPITAL_COMMUNITY): Payer: Self-pay | Admitting: Cardiology

## 2013-05-05 DIAGNOSIS — D649 Anemia, unspecified: Secondary | ICD-10-CM | POA: Diagnosis present

## 2013-05-05 DIAGNOSIS — E669 Obesity, unspecified: Secondary | ICD-10-CM | POA: Diagnosis present

## 2013-05-05 DIAGNOSIS — E785 Hyperlipidemia, unspecified: Secondary | ICD-10-CM | POA: Diagnosis present

## 2013-05-05 DIAGNOSIS — C189 Malignant neoplasm of colon, unspecified: Secondary | ICD-10-CM | POA: Diagnosis present

## 2013-05-05 DIAGNOSIS — E118 Type 2 diabetes mellitus with unspecified complications: Secondary | ICD-10-CM | POA: Diagnosis present

## 2013-05-05 DIAGNOSIS — I25118 Atherosclerotic heart disease of native coronary artery with other forms of angina pectoris: Secondary | ICD-10-CM | POA: Diagnosis present

## 2013-05-05 DIAGNOSIS — I10 Essential (primary) hypertension: Secondary | ICD-10-CM | POA: Diagnosis present

## 2013-05-05 DIAGNOSIS — E039 Hypothyroidism, unspecified: Secondary | ICD-10-CM | POA: Diagnosis present

## 2013-05-05 DIAGNOSIS — E1169 Type 2 diabetes mellitus with other specified complication: Secondary | ICD-10-CM | POA: Diagnosis present

## 2013-05-05 DIAGNOSIS — I2510 Atherosclerotic heart disease of native coronary artery without angina pectoris: Secondary | ICD-10-CM | POA: Diagnosis present

## 2013-05-05 LAB — GLUCOSE, CAPILLARY
Glucose-Capillary: 120 mg/dL — ABNORMAL HIGH (ref 70–99)
Glucose-Capillary: 187 mg/dL — ABNORMAL HIGH (ref 70–99)
Glucose-Capillary: 212 mg/dL — ABNORMAL HIGH (ref 70–99)
Glucose-Capillary: 97 mg/dL (ref 70–99)

## 2013-05-05 NOTE — Progress Notes (Signed)
PT Cancellation Note  Patient Details Name: Mary [name] MRN: [id number] DOB: Dec 05, 1940   Cancelled Treatment:    Reason Eval/Treat Not Completed: Medical issues which prohibited therapy. Pt stated she had already been in the hallway with with folks monitoring HR  And such. She stated cardiologist to evaluate things and discuss a plan tomorrow. Pt agreed at this time no need for skilled PT , will await pt's next medical steps. May need to sign off , will check back tomorrow or Monday.     Marella Bile 05/05/2013, 4:13 PM

## 2013-05-05 NOTE — Progress Notes (Signed)
cardiology notified that central tele states pt ? In third degree block and ekg done that showed a fib.

## 2013-05-05 NOTE — Progress Notes (Signed)
Cardiology at bedside.

## 2013-05-05 NOTE — Progress Notes (Signed)
Pt HR sustaining 32-36. On-call MD paged. Vital signs stable. Pt asleep. No new orders initiated will continue to monitor.

## 2013-05-05 NOTE — Progress Notes (Addendum)
Subjective:  Patient of Dr. Katrinka Blazing (requests reinstatement, husband ICD sees him) Chronic dizziness. Holds on to wall at home. Huston Foley Cancer was in for port removal. Huston Foley noted.   Objective:  Vital Signs in the last 24 hours: Temp:  [97.4 F (36.3 C)-99 F (37.2 C)] 97.9 F (36.6 C) (09/06 0533) Pulse Rate:  [40-53] 40 (09/06 0533) Resp:  [16-20] 16 (09/06 0533) BP: (106-190)/(33-75) 131/41 mmHg (09/06 0533) SpO2:  [92 %-97 %] 93 % (09/06 0533) Weight:  [115.214 kg (254 lb)-121.927 kg (268 lb 12.8 oz)] 121.927 kg (268 lb 12.8 oz) (09/06 0533)  Intake/Output from previous day: 09/05 0701 - 09/06 0700 In: 480 [P.O.:480] Out: -    Physical Exam: General: Well developed, well nourished, in no acute distress. Eating Head:  Normocephalic and atraumatic. Lungs: Clear to auscultation and percussion. Heart: Brady mildly irreg.   No murmur, rubs or gallops.  Abdomen: soft, non-tender, positive bowel sounds. Extremities: No clubbing or cyanosis. No edema. Neurologic: Alert and oriented x 3.    Lab Results:  Recent Labs  05/04/13 1010  WBC 10.0  HGB 14.3  PLT 299    Recent Labs  05/04/13 1558  NA 133*  K 4.3  CL 99  CO2 28  GLUCOSE 205*  BUN 26*  CREATININE 0.96    Recent Labs  05/04/13 2002 05/05/13 0220  TROPONINI <0.30 <0.30   Hepatic Function Panel  Recent Labs  05/04/13 1558  PROT 6.7  ALBUMIN 3.2*  AST 11  ALT 9  ALKPHOS 70  BILITOT 0.3     Telemetry: Second degree type 1 mostly. Occasional 2:1 conduction.  Personally viewed.   EKG:  Same as above  Cardiac Studies:  EF normal  Assessment/Plan:  Principal Problem:   Mobitz type 1 second degree atrioventricular block   - During walk increased HR to 66 from 38-40bpm  - Baseline wander during walk (continues with mobitz 1 pattern for the most part)  - On no Bb  - TSH mildly elevated  - EF normal  - Discussed with Dr. Johney Frame who will see in AM tomorrow  - ? If dizziness actually related to  decreased HR, nonetheless has significant bradycardia with occasional 2:1 conduction. Mostly group beating (Mobitz 1).   - Chronotropic incompetence noted during walk (uses walker). Peak HR 67bpm.   - Discussed the possibility of pacer with her. Will reassess with Dr. Johney Frame (EP).      Mary [name] 05/05/2013, 9:06 AM

## 2013-05-06 ENCOUNTER — Other Ambulatory Visit: Payer: Self-pay

## 2013-05-06 ENCOUNTER — Encounter (HOSPITAL_COMMUNITY): Payer: Self-pay | Admitting: Internal Medicine

## 2013-05-06 DIAGNOSIS — E079 Disorder of thyroid, unspecified: Secondary | ICD-10-CM

## 2013-05-06 DIAGNOSIS — I441 Atrioventricular block, second degree: Principal | ICD-10-CM

## 2013-05-06 DIAGNOSIS — I10 Essential (primary) hypertension: Secondary | ICD-10-CM

## 2013-05-06 MED ORDER — LEVOTHYROXINE SODIUM 200 MCG PO TABS: 200.0000 ug | ORAL_TABLET | Freq: Every day | ORAL | Status: DC

## 2013-05-06 NOTE — Consult Note (Signed)
ELECTROPHYSIOLOGY CONSULT NOTE     Primary Care Physician: No PCP Per Patient Referring Physician:  Dr Anne Fu  Admit Date: 05/04/2013  Reason for consultation: second degree AV block  Mary [name] is a 72 y.o. female with a h/o HTN, DM, and hypothyoidism who is admitted for bradycardia.  She presented electively for portacath removal and was found to have mobitz I second degree AV block.  She was admitted for observation.   She reports symptoms of fatigue chronically.  She has occasional dizziness, which is most pronounced upon standing.  She denies presyncope or syncope.  Today, she denies symptoms of palpitations, chest pain, shortness of breath, orthopnea, PND, lower extremity edema,  or neurologic sequela. The patient is tolerating medications without difficulties and is otherwise without complaint today.   Past Medical History  Diagnosis Date  . Colon cancer   . Diabetes mellitus without complication   . Hypertension   . Thyroid disease     hypothyroid  . Anemia     iron deficiency  . Diabetic peripheral neuropathy   . CAD (coronary artery disease)     non obstructive  . Hyperlipidemia   . Second degree Mobitz I AV block    Past Surgical History  Procedure Laterality Date  . Gastric bypass    . Colon surgery  2012    Tumor removal  . Cardiac catheterization  2006    Never had PCI, 3 caths total. Last one in [place]    . atorvastatin  40 mg Oral q1800  . buPROPion  150 mg Oral q morning - 10a  . fenofibrate  160 mg Oral Daily  . ferrous sulfate  325 mg Oral BID WC  . gabapentin  300 mg Oral TID  . lisinopril  20 mg Oral Daily   And  . hydrochlorothiazide  12.5 mg Oral Daily  . insulin aspart  0-15 Units Subcutaneous TID WC  . insulin glargine  12 Units Subcutaneous BID  . latanoprost  1 drop Right Eye QHS  . levothyroxine  175 mcg Oral QAC breakfast  . metFORMIN  1,000 mg Oral Q breakfast  . multivitamin with minerals  1 tablet Oral Daily  . sodium chloride  3  mL Intravenous Q12H      Allergies  Allergen Reactions  . Other Other (See Comments)    NO Blind Scopes with Naso Gastric tube.  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 Nsaids     S/P gastric bypass-told not to take    History   Social History  . Marital Status: Married    Spouse Name: Chrissie Noa    Number of Children: 0  . Years of Education: N/A   Occupational History  . Retired     office work   Social History Main Topics  . Smoking status: Current Every Day Smoker -- 0.50 packs/day for 20 years    Types: Cigarettes  . Smokeless tobacco: Current User  . Alcohol Use: Not on file  . Drug Use: Not on file  . Sexual Activity: Not on file   Other Topics Concern  . Not on file   Social History Narrative   Married to husband, Chrissie Noa   No children   Retired Hospital doctor to Electrical engineer   Recently moved back here from [place]     History reviewed. No pertinent family history.  ROS- All systems are reviewed and negative except as per the HPI above  Physical Exam: Telemetry: [facility]  Vitals:   05/05/13 1300 05/05/13 2037 05/06/13 0520 05/06/13 1300  BP: 129/58 141/54 111/52 116/52  Pulse: 43 47 43 50  Temp: 98.1 F (36.7 C) 97.4 F (36.3 C) 97.6 F (36.4 C) 97.8 F (36.6 C)  TempSrc: Oral Oral Oral Oral  Resp: 17 16 16 15   Height:      Weight:   265 lb 3.2 oz (120.294 kg)   SpO2: 95% 98% 95% 96%    GEN- The patient is overweight appearing, alert and oriented x 3 today.   Head- normocephalic, atraumatic Eyes-  Sclera clear, conjunctiva pink Ears- hearing intact Oropharynx- clear Neck- supple, no JVP Lymph- no cervical lymphadenopathy Lungs- Clear to ausculation bilaterally, normal work of breathing Heart- Regularly irregular rhythm  GI- soft, NT, ND, + BS Extremities- no clubbing, cyanosis, or edema MS- requires a walker for ambulation L chest portacath in place Skin- no rash or lesion Psych- euthymic mood, full affect Neuro- strength  and sensation are intact  EKG- mobitz I second degree AV block, incomplete RBBB, LAD Echo reviewed Epic notes are reviewed  Labs:   Lab Results  Component Value Date   WBC 10.0 05/04/2013   HGB 14.3 05/04/2013   HCT 43.0 05/04/2013   MCV 89.0 05/04/2013   PLT 299 05/04/2013    Recent Labs Lab 05/04/13 1558  NA 133*  K 4.3  CL 99  CO2 28  BUN 26*  CREATININE 0.96  CALCIUM 9.3  PROT 6.7  BILITOT 0.3  ALKPHOS 70  ALT 9  AST 11  GLUCOSE 205*   Lab Results  Component Value Date   TROPONINI <0.30 05/05/2013     ASSESSMENT AND PLAN:   1. Mobitz I second degree AV block The patient has mobitz I second degree AV block of unknown duration.  Her symptoms are fatigue and occasional dizziness.  I cannot be certain that these are related to mobitz I.  She appears to have underreplacement of her thyroid.  HCTZ may also be contributing.  Risks, benefits, alternatives to pacemaker implantation were discussed in detail with the patient today.  At this time, she would favor correcting her hypothyroidism and following closely. This is a reasonable option.  She is limited in mobility but appears to have increase in heart rate with ambulation in the halls with her walker.  She is aware that if her symptoms worsen or if she develops presyncope/ syncope that she should contact our office.  2. Hypothyroidism Increase levothyroxine.  PCP will need to follow closely to ensure correction  3. Dizziness Partially postural in nature Stop hctz which may be contributing  4. Portacath She may proceed with removal of her portacath without further CV evaluation   She will discharge to home later today.  Followup with Dr Katrinka Blazing in the next 1-2 weeks I will see again in 6 weeks.   Hillis Range, MD 05/06/2013  2:22 PM

## 2013-05-06 NOTE — Progress Notes (Signed)
Subjective:  No syncope, no CP, no SOB  Objective:  Vital Signs in the last 24 hours: Temp:  [97.4 F (36.3 C)-98.1 F (36.7 C)] 97.6 F (36.4 C) (09/07 0520) Pulse Rate:  [43-47] 43 (09/07 0520) Resp:  [16-17] 16 (09/07 0520) BP: (111-141)/(52-58) 111/52 mmHg (09/07 0520) SpO2:  [95 %-98 %] 95 % (09/07 0520) Weight:  [120.294 kg (265 lb 3.2 oz)] 120.294 kg (265 lb 3.2 oz) (09/07 0520)  Intake/Output from previous day: 09/06 0701 - 09/07 0700 In: 480 [P.O.:480] Out: -    Physical Exam: General: Well developed, well nourished, in no acute distress.  Head:  Normocephalic and atraumatic. Lungs: Clear to auscultation and percussion. Heart: Huston Foley, group beating.  No murmur, rubs or gallops.  Abdomen: soft, non-tender, positive bowel sounds. Obese Extremities: No clubbing or cyanosis. No edema. Neurologic: Alert and oriented x 3.    Lab Results:  Recent Labs  05/04/13 1010  WBC 10.0  HGB 14.3  PLT 299    Recent Labs  05/04/13 1558  NA 133*  K 4.3  CL 99  CO2 28  GLUCOSE 205*  BUN 26*  CREATININE 0.96    Recent Labs  05/05/13 0220 05/05/13 0830  TROPONINI <0.30 <0.30   Hepatic Function Panel  Recent Labs  05/04/13 1558  PROT 6.7  ALBUMIN 3.2*  AST 11  ALT 9  ALKPHOS 70  BILITOT 0.3     Telemetry: Continued second degree HB type 1. Once during night 24bpm per nursing.  Personally viewed.    Assessment/Plan:  Principal Problem:   Mobitz type 1 second degree atrioventricular block Active Problems:   Morbid obesity   CAD (coronary artery disease)   Diabetic peripheral neuropathy   Anemia   Thyroid disease   Hypertension   Colon cancer   Hyperlipidemia   Dizziness  Mobitz type 1 second degree atrioventricular block   - During walk yesterday increased HR to 66 from 38-40bpm  - Baseline wander during walk (continues with mobitz 1 pattern for the most part)  - On no Bb  - TSH mildly elevated  - EF normal  - Discussed with Dr. Johney Frame who  will see - ? If dizziness actually related to decreased HR, nonetheless has significant bradycardia with occasional 2:1 conduction. Mostly group beating (Mobitz 1).  - Chronotropic incompetence noted during walk (uses walker). Peak HR 67bpm.  - Discussed the possibility of pacer with her. Will reassess with Dr. Johney Frame (EP).        SKAINS, MARK 05/06/2013, 8:54 AM

## 2013-05-06 NOTE — Discharge Summary (Signed)
Physician Discharge Summary  Patient ID: Mary [name] MRN: [id number] DOB/AGE: 01/24/1941 72 y.o.  Admit date: 05/04/2013 Discharge date: 05/06/2013  Admission Diagnoses: Mobitz type I second-degree AV block, bradycardia  Discharge Diagnoses:  Principal Problem:   Mobitz type 1 second degree atrioventricular block Active Problems:   Morbid obesity   CAD (coronary artery disease)   Diabetic peripheral neuropathy   Anemia   Thyroid disease   Hypertension   Colon cancer   Hyperlipidemia   Dizziness   Discharged Condition: Fair Hospital Course: 72 year old female patient of Dr. Katrinka Blazing who was in radiology about to have a Port-A-Cath removed when she was noted to have significant bradycardia. Heart rates were in the 40s/upper 30s. She was admitted for observation.  No beta blocker, no calcium channel blocker, no digoxin. Her EKGs all demonstrate Mobitz type I second-degree heart block. Telemetry over the past 2 days has demonstrated the same finding. When walking in the hallway, she is able to get her heart rate up to 69 beats per minute.  She does complain of generalized dizziness which may not correlate with underlying bradycardia.  Electrophysiology, Dr. Hillis Range, consulted and recommended continued conservative management. No pacemaker.  CONSULT NOTE: 1. Mobitz I second degree AV block  The patient has mobitz I second degree AV block of unknown duration. Her symptoms are fatigue and occasional dizziness. I cannot be certain that these are related to mobitz I. She appears to have underreplacement of her thyroid. HCTZ may also be contributing. Risks, benefits, alternatives to pacemaker implantation were discussed in detail with the patient today. At this time, she would favor correcting her hypothyroidism and following closely.  This is a reasonable option. She is limited in mobility but appears to have increase in heart rate with ambulation in the halls with her walker. She is aware  that if her symptoms worsen or if she develops presyncope/ syncope that she should contact our office.  2. Hypothyroidism  Increase levothyroxine. PCP will need to follow closely to ensure correction  3. Dizziness  Partially postural in nature  Stop hctz which may be contributing  4. Portacath  She may proceed with removal of her portacath without further CV evaluation  She will discharge to home later today. Followup with Dr Katrinka Blazing in the next 1-2 weeks  I will see again in 6 weeks.  Overall doing well. Ready to be discharged.   Discharge Exam: Blood pressure 116/52, pulse 50, temperature 97.8 F (36.6 C), temperature source Oral, resp. rate 15, height 5' 7.5" (1.715 m), weight 120.294 kg (265 lb 3.2 oz), SpO2 96.00%.  General: Alert and oriented x3, pleasant, in no acute distress CV: Bradycardic with group beating Lungs: Clear to auscultation bilaterally Abdomen: Obese, positive bowel sounds, nontender Extremities: No edema.  Disposition:       Discharge Orders   Future Appointments Provider Department Dept Phone   11/12/2013 2:45 PM Sherrie Mustache [facility] MEDICAL ONCOLOGY [phone]   11/12/2013 3:15 PM Ladene Artist, MD [facility] MEDICAL ONCOLOGY [phone]   Future Orders Complete By Expires   Diet - low sodium heart healthy  As directed    Increase activity slowly  As directed        Medication List         atorvastatin 40 MG tablet  Commonly known as:  LIPITOR  Take 40 mg by mouth every morning.     bimatoprost 0.01 % Soln  Commonly known as:  LUMIGAN  Apply 1 drop to eye at bedtime. Right eye only     buPROPion 150 MG 12 hr tablet  Commonly known as:  WELLBUTRIN SR  Take 150 mg by mouth every morning.     Choline Fenofibrate 135 MG capsule  Take 135 mg by mouth at bedtime.     CITRACAL + D PO  Take 1 tablet by mouth 2 (two) times daily.     cyanocobalamin 1000 MCG/ML injection  Commonly known as:  (VITAMIN B-12)   Inject 1,000 mcg into the skin every 30 (thirty) days.     ferrous sulfate 325 (65 FE) MG tablet  Take 325 mg by mouth 2 (two) times daily.     gabapentin 300 MG capsule  Commonly known as:  NEURONTIN  Take 300 mg by mouth 3 (three) times daily.     HYDROcodone-acetaminophen 5-500 MG per tablet  Commonly known as:  VICODIN  Take 1 tablet by mouth every 4 (four) hours as needed for pain.     insulin glargine 100 UNIT/ML injection  Commonly known as:  LANTUS  Inject 12 Units into the skin 2 (two) times daily as needed (only if sugar is above 130).     levothyroxine 200 MCG tablet  Commonly known as:  SYNTHROID, LEVOTHROID  Take 1 tablet (200 mcg total) by mouth daily before breakfast.     levothyroxine 200 MCG tablet  Commonly known as:  SYNTHROID, LEVOTHROID  Take 1 tablet (200 mcg total) by mouth daily before breakfast.     lisinopril-hydrochlorothiazide 20-12.5 MG per tablet  Commonly known as:  PRINZIDE,ZESTORETIC  Take 1 tablet by mouth every morning.     metFORMIN 1000 MG tablet  Commonly known as:  GLUCOPHAGE  Take 1,000 mg by mouth 2 (two) times daily with a meal.     multivitamin with minerals Tabs tablet  Take 1 tablet by mouth daily.     Vitamin D (Ergocalciferol) 50000 UNITS Caps capsule  Commonly known as:  DRISDOL  Take 50,000 Units by mouth every 7 (seven) days.       Follow-up Information   Follow up with Lesleigh Noe, MD. Call in 2 weeks.   Specialty:  Cardiology   Contact information:   [address] [place] [address] [phone]      Only take one tablet of synthroid daily.   Discharge time: 35 min spent with patient, labs, meds, reconciliation.  SignedDonato Schultz 05/06/2013, 3:52 PM

## 2013-05-15 ENCOUNTER — Encounter: Payer: Self-pay | Admitting: Internal Medicine

## 2013-05-21 ENCOUNTER — Telehealth: Payer: Self-pay | Admitting: *Deleted

## 2013-05-21 NOTE — Telephone Encounter (Signed)
Permission to D/C order for [facility] removal and Dr. Truett Perna can re-enter when patient is medically clear for procedure. Was found to have bradycardia when she was there for procedure. Port not removed and she was kept overnight with instructions to follow up with Dr. Verdis Prime in 2 weeks.

## 2013-05-23 ENCOUNTER — Telehealth: Payer: Self-pay | Admitting: *Deleted

## 2013-05-23 ENCOUNTER — Telehealth: Payer: Self-pay | Admitting: Internal Medicine

## 2013-05-23 NOTE — Telephone Encounter (Signed)
05-07-13 lmm @ 925am to make 6 week fu appt with allred/mt 05-15-13 sent letter, no respone to phone message/mt

## 2013-05-23 NOTE — Telephone Encounter (Signed)
Called pt, she reports she is scheduled to follow up with the cardiologist 05/29/13. Informed her we'll plan for port removal once cardiac issues are resolved.

## 2013-05-29 DIAGNOSIS — I498 Other specified cardiac arrhythmias: Secondary | ICD-10-CM | POA: Diagnosis not present

## 2013-05-29 DIAGNOSIS — I10 Essential (primary) hypertension: Secondary | ICD-10-CM | POA: Diagnosis not present

## 2013-05-29 DIAGNOSIS — R42 Dizziness and giddiness: Secondary | ICD-10-CM | POA: Diagnosis not present

## 2013-05-31 ENCOUNTER — Encounter: Payer: Self-pay | Admitting: Oncology

## 2013-06-08 ENCOUNTER — Encounter: Payer: Self-pay | Admitting: Oncology

## 2013-06-08 ENCOUNTER — Other Ambulatory Visit: Payer: Self-pay

## 2013-06-08 ENCOUNTER — Telehealth: Payer: Self-pay | Admitting: Interventional Cardiology

## 2013-06-08 DIAGNOSIS — R001 Bradycardia, unspecified: Secondary | ICD-10-CM

## 2013-06-08 DIAGNOSIS — R42 Dizziness and giddiness: Secondary | ICD-10-CM

## 2013-06-08 NOTE — Telephone Encounter (Signed)
New problem:  Pt states the last time she saw Dr. Katrinka Blazing... He told her she would be receiving a heart monitor in the mail adn that someone would be in touch with her on how to use it. Pt states she has not yet received her heart monitor. Please advise

## 2013-06-08 NOTE — Telephone Encounter (Signed)
returned pt call.per Al Pimple. pt monitor order will be placed today. adv pt to follow up with Jasmine December. F. if she has not heard anything by 06/14/13. pt verbalized understanding. pt inquired about my chart.adv pt she would need to be seen in this office, and then she will be able to communicate with Dr.Smith via mychart

## 2013-06-11 ENCOUNTER — Encounter: Payer: Self-pay | Admitting: Internal Medicine

## 2013-06-11 ENCOUNTER — Telehealth: Payer: Self-pay | Admitting: Internal Medicine

## 2013-06-11 NOTE — Telephone Encounter (Signed)
05-07-13 lmm @ 925am to schedule 6 week fu appt with allred/mt 05-15-13 sent reminder letter/mt

## 2013-06-15 ENCOUNTER — Encounter (INDEPENDENT_AMBULATORY_CARE_PROVIDER_SITE_OTHER): Payer: Medicare Other

## 2013-06-15 DIAGNOSIS — R42 Dizziness and giddiness: Secondary | ICD-10-CM

## 2013-06-15 DIAGNOSIS — R001 Bradycardia, unspecified: Secondary | ICD-10-CM

## 2013-06-25 ENCOUNTER — Encounter (HOSPITAL_COMMUNITY): Payer: Self-pay | Admitting: Emergency Medicine

## 2013-06-25 ENCOUNTER — Inpatient Hospital Stay (HOSPITAL_COMMUNITY)
Admission: EM | Admit: 2013-06-25 | Discharge: 2013-06-27 | DRG: 309 | Disposition: A | Discharge status: Home / Self Care | Payer: Medicare Other | Attending: Interventional Cardiology | Admitting: Interventional Cardiology

## 2013-06-25 DIAGNOSIS — Z9884 Bariatric surgery status: Secondary | ICD-10-CM | POA: Diagnosis not present

## 2013-06-25 DIAGNOSIS — I10 Essential (primary) hypertension: Secondary | ICD-10-CM | POA: Diagnosis not present

## 2013-06-25 DIAGNOSIS — I2510 Atherosclerotic heart disease of native coronary artery without angina pectoris: Secondary | ICD-10-CM | POA: Diagnosis present

## 2013-06-25 DIAGNOSIS — D509 Iron deficiency anemia, unspecified: Secondary | ICD-10-CM | POA: Diagnosis present

## 2013-06-25 DIAGNOSIS — E1149 Type 2 diabetes mellitus with other diabetic neurological complication: Secondary | ICD-10-CM | POA: Diagnosis present

## 2013-06-25 DIAGNOSIS — M129 Arthropathy, unspecified: Secondary | ICD-10-CM | POA: Diagnosis present

## 2013-06-25 DIAGNOSIS — I498 Other specified cardiac arrhythmias: Secondary | ICD-10-CM | POA: Diagnosis present

## 2013-06-25 DIAGNOSIS — I4430 Unspecified atrioventricular block: Secondary | ICD-10-CM

## 2013-06-25 DIAGNOSIS — Z87891 Personal history of nicotine dependence: Secondary | ICD-10-CM

## 2013-06-25 DIAGNOSIS — I441 Atrioventricular block, second degree: Secondary | ICD-10-CM

## 2013-06-25 DIAGNOSIS — D649 Anemia, unspecified: Secondary | ICD-10-CM

## 2013-06-25 DIAGNOSIS — E785 Hyperlipidemia, unspecified: Secondary | ICD-10-CM | POA: Diagnosis present

## 2013-06-25 DIAGNOSIS — C189 Malignant neoplasm of colon, unspecified: Secondary | ICD-10-CM | POA: Diagnosis not present

## 2013-06-25 DIAGNOSIS — E1142 Type 2 diabetes mellitus with diabetic polyneuropathy: Secondary | ICD-10-CM

## 2013-06-25 DIAGNOSIS — E039 Hypothyroidism, unspecified: Secondary | ICD-10-CM | POA: Diagnosis present

## 2013-06-25 DIAGNOSIS — M545 Low back pain, unspecified: Secondary | ICD-10-CM | POA: Diagnosis present

## 2013-06-25 DIAGNOSIS — G8929 Other chronic pain: Secondary | ICD-10-CM | POA: Diagnosis present

## 2013-06-25 DIAGNOSIS — R0789 Other chest pain: Secondary | ICD-10-CM | POA: Diagnosis not present

## 2013-06-25 DIAGNOSIS — Z23 Encounter for immunization: Secondary | ICD-10-CM | POA: Diagnosis not present

## 2013-06-25 DIAGNOSIS — E119 Type 2 diabetes mellitus without complications: Secondary | ICD-10-CM

## 2013-06-25 DIAGNOSIS — E079 Disorder of thyroid, unspecified: Secondary | ICD-10-CM

## 2013-06-25 DIAGNOSIS — I440 Atrioventricular block, first degree: Secondary | ICD-10-CM | POA: Diagnosis not present

## 2013-06-25 DIAGNOSIS — E042 Nontoxic multinodular goiter: Secondary | ICD-10-CM | POA: Diagnosis present

## 2013-06-25 DIAGNOSIS — R42 Dizziness and giddiness: Secondary | ICD-10-CM

## 2013-06-25 DIAGNOSIS — Z85038 Personal history of other malignant neoplasm of large intestine: Secondary | ICD-10-CM

## 2013-06-25 DIAGNOSIS — Z6841 Body Mass Index (BMI) 40.0 and over, adult: Secondary | ICD-10-CM | POA: Diagnosis not present

## 2013-06-25 HISTORY — DX: Type 2 diabetes mellitus without complications: E11.9

## 2013-06-25 HISTORY — DX: Nontoxic multinodular goiter: E04.2

## 2013-06-25 HISTORY — DX: Other chronic pain: G89.29

## 2013-06-25 HISTORY — DX: Iron deficiency anemia, unspecified: D50.9

## 2013-06-25 HISTORY — DX: Unspecified osteoarthritis, unspecified site: M19.90

## 2013-06-25 HISTORY — DX: Hypothyroidism, unspecified: E03.9

## 2013-06-25 HISTORY — DX: Low back pain, unspecified: M54.50

## 2013-06-25 HISTORY — DX: Sleep apnea, unspecified: G47.30

## 2013-06-25 HISTORY — DX: Low back pain: M54.5

## 2013-06-25 HISTORY — DX: Acute embolism and thrombosis of unspecified deep veins of unspecified lower extremity: I82.409

## 2013-06-25 LAB — CREATININE, SERUM
Creatinine, Ser: 0.99 mg/dL (ref 0.50–1.10)
GFR calc Af Amer: 65 mL/min — ABNORMAL LOW (ref >90)
GFR calc non Af Amer: 56 mL/min — ABNORMAL LOW (ref >90)

## 2013-06-25 LAB — CBC
Hemoglobin: 12.9 g/dL (ref 12.0–15.0)
MCH: 30.6 pg (ref 26.0–34.0)
MCV: 90.5 fL (ref 78.0–100.0)
MCV: 90 fL (ref 78.0–100.0)
Platelets: 321 10*3/uL (ref 150–400)
RBC: 4.22 MIL/uL (ref 3.87–5.11)
RDW: 12.9 % (ref 11.5–15.5)
WBC: 8.2 10*3/uL (ref 4.0–10.5)

## 2013-06-25 LAB — OCCULT BLOOD, POC DEVICE: Fecal Occult Bld: NEGATIVE

## 2013-06-25 LAB — COMPREHENSIVE METABOLIC PANEL
BUN: 16 mg/dL (ref 6–23)
CO2: 27 mEq/L (ref 19–32)
Calcium: 9.4 mg/dL (ref 8.4–10.5)
Creatinine, Ser: 1.05 mg/dL (ref 0.50–1.10)
GFR calc Af Amer: 60 mL/min — ABNORMAL LOW (ref >90)
GFR calc non Af Amer: 52 mL/min — ABNORMAL LOW (ref >90)
Glucose, Bld: 186 mg/dL — ABNORMAL HIGH (ref 70–99)

## 2013-06-25 LAB — TSH: TSH: 18.044 u[IU]/mL — ABNORMAL HIGH (ref 0.350–4.500)

## 2013-06-25 LAB — POCT I-STAT TROPONIN I

## 2013-06-25 MED ORDER — HYDROCODONE-ACETAMINOPHEN 5-325 MG PO TABS: 1.0000 | ORAL_TABLET | Freq: Four times a day (QID) | ORAL | Status: DC | PRN

## 2013-06-25 MED ORDER — INFLUENZA VAC SPLIT QUAD 0.5 ML IM SUSP
0.5000 mL | INTRAMUSCULAR | Status: AC
  Administered 2013-06-26: 0.5 mL via INTRAMUSCULAR
  Filled 2013-06-25: qty 0.5

## 2013-06-25 MED ORDER — HYDROCHLOROTHIAZIDE 12.5 MG PO CAPS
12.5000 mg | ORAL_CAPSULE | Freq: Every day | ORAL | Status: DC
  Administered 2013-06-25 – 2013-06-26 (×2): 12.5 mg via ORAL
  Filled 2013-06-25 (×3): qty 1

## 2013-06-25 MED ORDER — ASPIRIN 81 MG PO CHEW
324.0000 mg | CHEWABLE_TABLET | Freq: Once | ORAL | Status: DC
  Filled 2013-06-25: qty 4

## 2013-06-25 MED ORDER — ALUM & MAG HYDROXIDE-SIMETH 200-200-20 MG/5ML PO SUSP: 30.0000 mL | ORAL | Status: DC | PRN

## 2013-06-25 MED ORDER — ATORVASTATIN CALCIUM 40 MG PO TABS
40.0000 mg | ORAL_TABLET | Freq: Every day | ORAL | Status: DC
  Administered 2013-06-25 – 2013-06-26 (×2): 40 mg via ORAL
  Filled 2013-06-25 (×3): qty 1

## 2013-06-25 MED ORDER — SODIUM CHLORIDE 0.9 % IV SOLN: 250.0000 mL | INTRAVENOUS | Status: DC | PRN

## 2013-06-25 MED ORDER — VITAMIN D (ERGOCALCIFEROL) 1.25 MG (50000 UNIT) PO CAPS: 50000.0000 [IU] | ORAL_CAPSULE | ORAL | Status: DC

## 2013-06-25 MED ORDER — LISINOPRIL 20 MG PO TABS
20.0000 mg | ORAL_TABLET | Freq: Every day | ORAL | Status: DC
  Administered 2013-06-25 – 2013-06-27 (×3): 20 mg via ORAL
  Filled 2013-06-25 (×3): qty 1

## 2013-06-25 MED ORDER — BUPROPION HCL ER (SR) 150 MG PO TB12
150.0000 mg | ORAL_TABLET | Freq: Every day | ORAL | Status: DC
  Administered 2013-06-25 – 2013-06-27 (×3): 150 mg via ORAL
  Filled 2013-06-25 (×3): qty 1

## 2013-06-25 MED ORDER — ONDANSETRON HCL 4 MG/2ML IJ SOLN: 4.0000 mg | Freq: Four times a day (QID) | INTRAMUSCULAR | Status: DC | PRN

## 2013-06-25 MED ORDER — ENOXAPARIN SODIUM 60 MG/0.6ML ~~LOC~~ SOLN
60.0000 mg | SUBCUTANEOUS | Status: DC
  Administered 2013-06-25: 60 mg via SUBCUTANEOUS
  Filled 2013-06-25 (×2): qty 0.6

## 2013-06-25 MED ORDER — INSULIN GLARGINE 100 UNIT/ML ~~LOC~~ SOLN
12.0000 [IU] | Freq: Two times a day (BID) | SUBCUTANEOUS | Status: DC | PRN
  Filled 2013-06-25: qty 0.12

## 2013-06-25 MED ORDER — ENOXAPARIN SODIUM 40 MG/0.4ML ~~LOC~~ SOLN: 40.0000 mg | SUBCUTANEOUS | Status: DC

## 2013-06-25 MED ORDER — METFORMIN HCL 500 MG PO TABS
1000.0000 mg | ORAL_TABLET | Freq: Two times a day (BID) | ORAL | Status: DC
  Filled 2013-06-25: qty 2

## 2013-06-25 MED ORDER — CYANOCOBALAMIN 1000 MCG/ML IJ SOLN: 1000.0000 ug | INTRAMUSCULAR | Status: DC

## 2013-06-25 MED ORDER — METFORMIN HCL 500 MG PO TABS
1000.0000 mg | ORAL_TABLET | Freq: Two times a day (BID) | ORAL | Status: DC
  Administered 2013-06-25 – 2013-06-27 (×3): 1000 mg via ORAL
  Filled 2013-06-25 (×6): qty 2

## 2013-06-25 MED ORDER — ALUM & MAG HYDROXIDE-SIMETH 200-200-20 MG/5ML PO SUSP
15.0000 mL | ORAL | Status: DC | PRN
  Administered 2013-06-25: 15 mL via ORAL
  Filled 2013-06-25: qty 30

## 2013-06-25 MED ORDER — FENOFIBRATE 54 MG PO TABS
54.0000 mg | ORAL_TABLET | Freq: Every day | ORAL | Status: DC
  Administered 2013-06-25 – 2013-06-27 (×3): 54 mg via ORAL
  Filled 2013-06-25 (×3): qty 1

## 2013-06-25 MED ORDER — SODIUM CHLORIDE 0.9 % IJ SOLN
3.0000 mL | Freq: Two times a day (BID) | INTRAMUSCULAR | Status: DC
  Administered 2013-06-25 – 2013-06-26 (×3): 3 mL via INTRAVENOUS

## 2013-06-25 MED ORDER — LEVOTHYROXINE SODIUM 200 MCG PO TABS
200.0000 ug | ORAL_TABLET | Freq: Every day | ORAL | Status: DC
  Administered 2013-06-26: 200 ug via ORAL
  Filled 2013-06-25 (×2): qty 1

## 2013-06-25 MED ORDER — FERROUS SULFATE 325 (65 FE) MG PO TABS
325.0000 mg | ORAL_TABLET | Freq: Two times a day (BID) | ORAL | Status: DC
Start: 2013-06-25 — End: 2013-06-27
  Administered 2013-06-25 – 2013-06-27 (×3): 325 mg via ORAL
  Filled 2013-06-25 (×6): qty 1

## 2013-06-25 MED ORDER — GABAPENTIN 300 MG PO CAPS
300.0000 mg | ORAL_CAPSULE | Freq: Three times a day (TID) | ORAL | Status: DC
  Administered 2013-06-25 – 2013-06-27 (×5): 300 mg via ORAL
  Filled 2013-06-25 (×7): qty 1

## 2013-06-25 MED ORDER — LISINOPRIL-HYDROCHLOROTHIAZIDE 20-12.5 MG PO TABS: 1.0000 | ORAL_TABLET | Freq: Every morning | ORAL | Status: DC

## 2013-06-25 MED ORDER — ACETAMINOPHEN 325 MG PO TABS: 650.0000 mg | ORAL_TABLET | ORAL | Status: DC | PRN

## 2013-06-25 MED ORDER — LATANOPROST 0.005 % OP SOLN
1.0000 [drp] | Freq: Every day | OPHTHALMIC | Status: DC
  Administered 2013-06-25 – 2013-06-26 (×2): 1 [drp] via OPHTHALMIC
  Filled 2013-06-25: qty 2.5

## 2013-06-25 MED ORDER — ADULT MULTIVITAMIN W/MINERALS CH
1.0000 | ORAL_TABLET | Freq: Every day | ORAL | Status: DC
  Administered 2013-06-27: 1 via ORAL
  Filled 2013-06-25 (×2): qty 1

## 2013-06-25 MED ORDER — SODIUM CHLORIDE 0.9 % IJ SOLN: 3.0000 mL | INTRAMUSCULAR | Status: DC | PRN

## 2013-06-25 NOTE — ED Provider Notes (Signed)
CSN: [id number]     Arrival date & time 06/25/13  1224 History   First MD Initiated Contact with Patient 06/25/13 1250     Chief Complaint  Patient presents with  . Dizziness  . Bradycardia   (Consider location/radiation/quality/duration/timing/severity/associated sxs/prior Treatment) HPI Comments: 72 year old female presents with lightheadedness/dizziness. She follows with Dr. Katrinka Blazing of cardiology and has been wearing a Holter monitor for the past week. She's been alerted by the Holter monitor company that she been having bradycardia. The lowest episode was down to 22 beats per minute this morning. She states she does not think she's had any worsening disease during this time. She's also felt fatigued and has chronic anemia. She has had some chest pressure intermittently for all this time. The most recent episode which is prior to my arrival and she is now pain-free. Dr. Katrinka Blazing apparently called her and told her to come to the ER for evaluation and she's had multiple bradycardic episodes this morning.   Past Medical History  Diagnosis Date  . Colon cancer   . Diabetes mellitus without complication   . Hypertension   . Thyroid disease     hypothyroid  . Anemia     iron deficiency  . Diabetic peripheral neuropathy   . CAD (coronary artery disease)     non obstructive  . Hyperlipidemia   . Second degree Mobitz I AV block    Past Surgical History  Procedure Laterality Date  . Gastric bypass    . Colon surgery  2012    Tumor removal  . Cardiac catheterization  2006    Never had PCI, 3 caths total. Last one in [place]   No family history on file. History  Substance Use Topics  . Smoking status: Former Smoker -- 0.50 packs/day for 20 years    Types: Cigarettes  . Smokeless tobacco: Current User  . Alcohol Use: No   OB History   Grav Para Term Preterm Abortions TAB SAB Ect Mult Living                 Review of Systems  Constitutional: Positive for fatigue.  Respiratory:  Negative for shortness of breath.   Cardiovascular: Positive for chest pain and palpitations.  Gastrointestinal: Negative for vomiting.  Neurological: Positive for light-headedness. Negative for weakness, numbness and headaches.  All other systems reviewed and are negative.    Allergies  Other and Nsaids  Home Medications   Current Outpatient Rx  Name  Route  Sig  Dispense  Refill  . atorvastatin (LIPITOR) 40 MG tablet   Oral   Take 40 mg by mouth every morning.          . bimatoprost (LUMIGAN) 0.01 % SOLN   Ophthalmic   Apply 1 drop to eye at bedtime. Right eye only         . buPROPion (WELLBUTRIN SR) 150 MG 12 hr tablet   Oral   Take 150 mg by mouth every morning.          . Calcium Citrate-Vitamin D (CITRACAL + D PO)   Oral   Take 1 tablet by mouth 2 (two) times daily.         . Choline Fenofibrate 135 MG capsule   Oral   Take 135 mg by mouth at bedtime.          . cyanocobalamin (,VITAMIN B-12,) 1000 MCG/ML injection   Subcutaneous   Inject 1,000 mcg into the skin every 30 (thirty) days.         Marland Kitchen  ferrous sulfate 325 (65 FE) MG tablet   Oral   Take 325 mg by mouth 2 (two) times daily.         Marland Kitchen gabapentin (NEURONTIN) 300 MG capsule   Oral   Take 300 mg by mouth 3 (three) times daily.         Marland Kitchen HYDROcodone-acetaminophen (VICODIN) 5-500 MG per tablet   Oral   Take 1 tablet by mouth every 4 (four) hours as needed for pain.         Marland Kitchen insulin glargine (LANTUS) 100 UNIT/ML injection   Subcutaneous   Inject 12 Units into the skin 2 (two) times daily as needed (only if sugar is above 130).          Marland Kitchen levothyroxine (SYNTHROID, LEVOTHROID) 200 MCG tablet   Oral   Take 1 tablet (200 mcg total) by mouth daily before breakfast.   30 tablet   12   . levothyroxine (SYNTHROID, LEVOTHROID) 200 MCG tablet   Oral   Take 1 tablet (200 mcg total) by mouth daily before breakfast.   30 tablet   12   . lisinopril-hydrochlorothiazide  (PRINZIDE,ZESTORETIC) 20-12.5 MG per tablet   Oral   Take 1 tablet by mouth every morning.          . metFORMIN (GLUCOPHAGE) 1000 MG tablet   Oral   Take 1,000 mg by mouth 2 (two) times daily with a meal.          . Multiple Vitamin (MULTIVITAMIN WITH MINERALS) TABS tablet   Oral   Take 1 tablet by mouth daily.         . Vitamin D, Ergocalciferol, (DRISDOL) 50000 UNITS CAPS capsule   Oral   Take 50,000 Units by mouth every 7 (seven) days.          BP 189/88  Pulse 60  Temp(Src) 98.4 F (36.9 C) (Oral)  Resp 15  Wt 256 lb 5 oz (116.263 kg)  BMI 39.53 kg/m2  SpO2 95% Physical Exam  Nursing note and vitals reviewed. Constitutional: She is oriented to person, place, and time. She appears well-developed and well-nourished.  HENT:  Head: Normocephalic and atraumatic.  Right Ear: External ear normal.  Left Ear: External ear normal.  Nose: Nose normal.  Eyes: Right eye exhibits no discharge. Left eye exhibits no discharge.  Cardiovascular: Normal rate, regular rhythm and normal heart sounds.   Pulmonary/Chest: Effort normal and breath sounds normal.  Abdominal: Soft. There is no tenderness.  Neurological: She is alert and oriented to person, place, and time.  Skin: Skin is warm and dry.    ED Course  Procedures (including critical care time) Labs Review Labs Reviewed  COMPREHENSIVE METABOLIC PANEL - Abnormal; Notable for the following:    Sodium 134 (*)    Glucose, Bld 186 (*)    Albumin 3.4 (*)    GFR calc non Af Amer 52 (*)    GFR calc Af Amer 60 (*)    All other components within normal limits  CBC  TROPONIN I  POCT I-STAT TROPONIN I   Imaging Review No results found.   Date: 06/25/2013  Rate: 52  Rhythm: sinus bradycardia  QRS Axis: left  Intervals: normal  ST/T Wave abnormalities: nonspecific T wave changes  Conduction Disutrbances:second-degree A-V block, ( Mobitz I )  Narrative Interpretation:   Old EKG Reviewed: changes noted    MDM   1.  AV block    Patient is hemodynamically stable in the ER. She  does not have any acute chest pain on my exam. She was given aspirin as she had chest pain earlier today. Dr. Katrinka Blazing of cardiology evaluated the patient and given her transient but recurrent bradycardia will admit her on telemetry.    Audree Camel, MD 06/25/13 1556

## 2013-06-25 NOTE — ED Notes (Signed)
Pt is having to wear holter monitor for periods of dizziness for a while and she was called and told to come in for periods of bradycardia as low as 22.  Dr. Katrinka Blazing Cards.  Pt appears to have flucuations in heart rate from 50s to 80s at triage

## 2013-06-25 NOTE — Consult Note (Signed)
Reason for Consult:symptomatic bradycardia  Referring Physician: [name] is an 72 y.o. female.   HPI: Mary [name] is a 72 yo woman with a h/o colon CA, HTN, DM, and documented bradycardia in the past. She has an indwelling port-a-cath which was to be removed several weeks ago when she was called for bradycardia and the procedure was cancelled. At that time she had transient bradycardia and was seen by Dr. Johney [name]. Because she had predominantly Mobitz 1 second degree AV block, a pacemaker was not recommended. The patient has worn a cardiac monitor for the last week. She is developed nocturnal bradycardia with heart rates less than 30 beats per minute, but today, developed an bradycardia with heart rates of less than 30 beats per minute which were sustained, and which occurred during the daytime while the patient was awake. These episodes were associated with dizziness, and lightheadedness. She did not have frank syncope. Complicating her situation is that the patient gets dizzy at times when she has not documented to have bradycardia. She is on no AV nodal blocking drugs. She has chronic intermittent peripheral edema which has not worsened. Her heart failure symptoms are class II. She denies anginal symptoms.  PMH: Past Medical History  Diagnosis Date  . Colon cancer   . Diabetes mellitus without complication   . Hypertension   . Thyroid disease     hypothyroid  . Anemia     iron deficiency  . Diabetic peripheral neuropathy   . CAD (coronary artery disease)     non obstructive  . Hyperlipidemia   . Second degree Mobitz I AV block     PSHX: Past Surgical History  Procedure Laterality Date  . Gastric bypass    . Colon surgery  2012    Tumor removal  . Cardiac catheterization  2006    Never had PCI, 3 caths total. Last one in [place]    FAMHX: Family History  Problem Relation Age of Onset  . Breast cancer Mother   . Heart disease Father   . Heart attack Father   .  Heart attack Sister   . Heart attack Brother     Social History:  reports that she has quit smoking. Her smoking use included Cigarettes. She has a 10 pack-year smoking history. She uses smokeless tobacco. She reports that she does not drink alcohol or use illicit drugs.  Allergies:  Allergies  Allergen Reactions  . Other Other (See Comments)    NO Blind Scopes with Naso Gastric tube.  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 Aspirin     S/P gastric bypass surgery, states her MD told her to not take Aspirin.  . Latex     Tape= burns  . Nsaids     S/P gastric bypass-told not to take    Medications: reviewed No results found.  ROS  As stated in the HPI and negative for all other systems.  Physical Exam  Vitals:Blood pressure 184/81, pulse 59, temperature 98.4 F (36.9 C), temperature source Oral, resp. rate 18, height 5' 7.32" (1.71 m), weight 256 lb 5 oz (116.263 kg), SpO2 96.00%.  Well appearing NAD HEENT: Unremarkable Neck:  No JVD, no thyromegally Lymphatics:  No adenopathy Back:  No CVA tenderness Lungs:  Clear HEART:  Regular rate rhythm, no murmurs, no rubs, no clicks Abd:  Flat, positive bowel sounds, no organomegally, no rebound, no guarding Ext:  2 plus pulses, no edema, no cyanosis, no clubbing Skin:  No rashes no  nodules Neuro:  CN II through XII intact, motor grossly intact  Assessment/Plan: 1. Symptomatic 2:1 AV block 2. AV WB 3. Dizziness corresponding to #1.  4. HTN 5. Morbid obesity Rec: At this point the patient has evidence for PPM insertion. Complicating however is the presence of her left sided indwelling port-a cath in the left subclavian position. We will observe overnight but I suspect PPM insertion will be required. For now, avoidance of her AV nodal blocking drugs is indicated.  Mary [name],M.D.  Mary [name],M.D. Mary Gowda TaylorMD 06/25/2013, 5:29 PM

## 2013-06-25 NOTE — Telephone Encounter (Signed)
Theodoro Kos from holter/treadmill room reported [employer] called back to inform us that patient continues to have episodes of bradycardia and that they spoke with patient who reports SOB and dizziness.  I advised Orpha Bur that I would call patient to inform her to go the ER.  I called patient who verified the report from [employer] and patient agrees to let her husband drive her to [facility].  I notified Dr. Katrinka Blazing and Rosann Auerbach, hospital liason.

## 2013-06-25 NOTE — Telephone Encounter (Signed)
I called patient after receiving a call from Clive with [employer] regarding a monitor event that occurred at 0447 today with reported HR 28 bpm with 2nd and 3rd degree heart block.  [employer] reports that they called the patient who was sleeping and denied symptoms.  The patient reports to me currently that she is experiencing dizziness even though she is laying down.  Patient states she received a call from [employer] on Saturday morning for the same thing as well.  I advised patient to remain at home and that I am going to call Dr. Katrinka Blazing, patient's cardiologist who is in the hospital today.  Dr. Katrinka Blazing advised that if patient remains symptomatic that she should come into the ER today for evaluation.  Dr. Katrinka Blazing advised that if patient is not symptomatic that she needs to be seen by him tomorrow in the office.  I spoke with patient and informed her of Dr. Michaelle Copas advice.  Patient states she is dizzy, but that she has been experiencing dizziness since first being told that her heart rate was low several weeks ago.  Patient states she will go to the ER is symptoms worsen.  I scheduled patient for an appointment with Dr. Katrinka Blazing at 12:00 tomorrow and informed patient not to drive today and to call back if she has questions or concerns.  Patient verbalized understanding and agreement with plan.

## 2013-06-25 NOTE — ED Notes (Signed)
Dr. Ladona Ridgel at bedside to speak with patient. Cala Bradford on 3W made aware.

## 2013-06-25 NOTE — H&P (Addendum)
Mary [name] is a 72 y.o. female  Admit Date: 06/25/2013 Referring Physician: None Primary Cardiologist:: H. WB Leia Alf, M.D. Chief complaint / reason for admission: Bradycardia and dizziness  HPI: 72 year old female with multiple medical problems including colon cancer, diabetes, hypertension, nonobstructive coronary disease, and history of Mobitz 1 second-degree heart block. She'll recent hospital observation because of bradycardia that was noted when she came to the [facility] to have her Port-A-Cath removed. She was seen in consultation by Dr. Johney Frame who did not feel that pacemaker therapy was indicated. She began wearing a continuous ambulatory monitor approximately a week ago. Over the past 36 hours she has been notified 4 times by the monitoring service because of heart rates less than 30 beats per minute. 2 of the episodes occurred in the millimeter night and the phone calls awakened her from sleep. 2 episodes occurring later this morning occurred while she was awake. She complains of dizziness but this symptom is poorly characterized. It is present in the emergency room with a heart rate of 60 beats per minute. She has no chest discomfort or dyspnea.    PMH:    Past Medical History  Diagnosis Date  . Colon cancer   . Diabetes mellitus without complication   . Hypertension   . Thyroid disease     hypothyroid  . Anemia     iron deficiency  . Diabetic peripheral neuropathy   . CAD (coronary artery disease)     non obstructive  . Hyperlipidemia   . Second degree Mobitz I AV block     PSH:    Past Surgical History  Procedure Laterality Date  . Gastric bypass    . Colon surgery  2012    Tumor removal  . Cardiac catheterization  2006    Never had PCI, 3 caths total. Last one in [place]   ALLERGIES:   Other; Latex; and Nsaids Prior to Admit Meds:     Medication List    ASK your doctor about these medications       atorvastatin 40 MG tablet    Commonly known as:  LIPITOR  Take 40 mg by mouth every morning.     bimatoprost 0.01 % Soln  Commonly known as:  LUMIGAN  Apply 1 drop to eye at bedtime. Right eye only     buPROPion 150 MG 12 hr tablet  Commonly known as:  WELLBUTRIN SR  Take 150 mg by mouth every morning.     Choline Fenofibrate 135 MG capsule  Take 135 mg by mouth at bedtime.     CITRACAL + D PO  Take 1 tablet by mouth 2 (two) times daily.     cyanocobalamin 1000 MCG/ML injection  Commonly known as:  (VITAMIN B-12)  Inject 1,000 mcg into the skin every 30 (thirty) days.     ferrous sulfate 325 (65 FE) MG tablet  Take 325 mg by mouth 2 (two) times daily.     gabapentin 300 MG capsule  Commonly known as:  NEURONTIN  Take 300 mg by mouth 3 (three) times daily.     HYDROcodone-acetaminophen 5-500 MG per tablet  Commonly known as:  VICODIN  Take 1 tablet by mouth every 4 (four) hours as needed for pain.     insulin glargine 100 UNIT/ML injection  Commonly known as:  LANTUS  Inject 12 Units into the skin 2 (two) times daily as needed (only if sugar is above 130).  levothyroxine 200 MCG tablet  Commonly known as:  SYNTHROID, LEVOTHROID  Take 1 tablet (200 mcg total) by mouth daily before breakfast.     lisinopril-hydrochlorothiazide 20-12.5 MG per tablet  Commonly known as:  PRINZIDE,ZESTORETIC  Take 1 tablet by mouth every morning.     metFORMIN 1000 MG tablet  Commonly known as:  GLUCOPHAGE  Take 1,000 mg by mouth 2 (two) times daily with a meal.     multivitamin with minerals Tabs tablet  Take 1 tablet by mouth daily.     Vitamin D (Ergocalciferol) 50000 UNITS Caps capsule  Commonly known as:  DRISDOL  Take 50,000 Units by mouth every 7 (seven) days. Wednesday night       Family HX:   CAD father, brother, and 2 sisters with MI.  Social HX:    History   Social History  . Marital Status: Married    Spouse Name: Chrissie Noa    Number of Children: 0  . Years of Education: N/A    Occupational History  . Retired     office work   Social History Main Topics  . Smoking status: Former Smoker -- 0.50 packs/day for 20 years    Types: Cigarettes  . Smokeless tobacco: Current User  . Alcohol Use: No  . Drug Use: No  . Sexual Activity: Not on file   Other Topics Concern  . Not on file   Social History Narrative   Married to husband, Chrissie Noa   No children   Retired Hospital doctor to Electrical engineer   Recently moved back here from [place]      ROS: She denies syncope, palpitations, chest pain, edema, orthopnea, PND, transient neurological symptoms, and supplemental agents over-the-counter. There is a history of colon cancer. Several first-line blood relatives have died from sudden cardiac problems.  Physical Exam: Blood pressure 185/89, pulse 64, temperature 98.4 F (36.9 C), temperature source Oral, resp. rate 16, weight 256 lb 5 oz (116.263 kg), SpO2 96.00%.   Obese. No distress. Skin color is normal. Neck exam reveals no JVD. No carotid bruits. Cardiac exam reveals slight irregular rhythm. 1 of 6 systolic murmur is heard. Chest is clear to auscultation and percussion. Abdomen is obese, nontender, and with normal bowel sounds Extremities reveal no edema Neurological exam is grossly normal Labs: Lab Results  Component Value Date   WBC 7.9 06/25/2013   HGB 12.9 06/25/2013   HCT 38.2 06/25/2013   MCV 90.5 06/25/2013   PLT 317 06/25/2013    Recent Labs Lab 06/25/13 1248  NA 134*  K 5.0  CL 98  CO2 27  BUN 16  CREATININE 1.05  CALCIUM 9.4  PROT 7.3  BILITOT 0.4  ALKPHOS 80  ALT 9  AST 11  GLUCOSE 186*   Lab Results  Component Value Date   TROPONINI <0.30 05/05/2013   Echocardiogram: 05/04/2013  Study Conclusions  - Left ventricle: The cavity size was normal. Wall thickness was increased in a pattern of mild LVH. Systolic function was normal. The estimated ejection fraction was in the range of 55% to 60%. Wall  motion was normal; there were no regional wall motion abnormalities. - Mitral valve: Calcified annulus. - Left atrium: The atrium was mildly dilated.   Radiology:  No recent study  EKG: sinus rhythm, left axis deviation, and Mobitz 1 second-degree A-V block. Monitor reveals second-degree A-V block. Ambulatory monitor strips not yet available for review  ASSESSMENT:   1. AV block, previously documented  to be Mobitz 1. Ambulatory monitor (by report) demonstrating high-grade AV block with effective heart rates in the 30s on multiple occasions over the weekend.  2. The patient is symptomatic with "dizziness" which is currently present with a heart rate of 60 beats per minute. I don't believe this symptom has anything to do with the patient's rhythm disturbance.  Plan:  1. Place on observation and get copies of the rhythm strips from the ambulatory monitor.  2. EP consultation  3. There is a disconnect between the patient's complaints and rhythm disturbance.   Lesleigh Noe 06/25/2013 2:44 PM

## 2013-06-26 ENCOUNTER — Encounter (HOSPITAL_COMMUNITY): Payer: Self-pay | Admitting: Radiology

## 2013-06-26 ENCOUNTER — Observation Stay (HOSPITAL_COMMUNITY): Payer: Medicare Other

## 2013-06-26 ENCOUNTER — Ambulatory Visit: Payer: Medicare Other | Admitting: Interventional Cardiology

## 2013-06-26 DIAGNOSIS — E1142 Type 2 diabetes mellitus with diabetic polyneuropathy: Secondary | ICD-10-CM | POA: Diagnosis not present

## 2013-06-26 DIAGNOSIS — I10 Essential (primary) hypertension: Secondary | ICD-10-CM

## 2013-06-26 DIAGNOSIS — C189 Malignant neoplasm of colon, unspecified: Secondary | ICD-10-CM | POA: Diagnosis not present

## 2013-06-26 DIAGNOSIS — I441 Atrioventricular block, second degree: Principal | ICD-10-CM

## 2013-06-26 DIAGNOSIS — Z6841 Body Mass Index (BMI) 40.0 and over, adult: Secondary | ICD-10-CM | POA: Diagnosis not present

## 2013-06-26 DIAGNOSIS — I498 Other specified cardiac arrhythmias: Secondary | ICD-10-CM | POA: Diagnosis not present

## 2013-06-26 DIAGNOSIS — E042 Nontoxic multinodular goiter: Secondary | ICD-10-CM | POA: Diagnosis not present

## 2013-06-26 DIAGNOSIS — E039 Hypothyroidism, unspecified: Secondary | ICD-10-CM

## 2013-06-26 LAB — PROTIME-INR: INR: 0.99 (ref 0.00–1.49)

## 2013-06-26 LAB — GLUCOSE, CAPILLARY: Glucose-Capillary: 184 mg/dL — ABNORMAL HIGH (ref 70–99)

## 2013-06-26 MED ORDER — SODIUM CHLORIDE 0.9 % IV SOLN
INTRAVENOUS | Status: AC | PRN
  Administered 2013-06-26: 50 mL/h via INTRAVENOUS

## 2013-06-26 MED ORDER — CEFAZOLIN SODIUM-DEXTROSE 2-3 GM-% IV SOLR
2.0000 g | Freq: Once | INTRAVENOUS | Status: AC
  Administered 2013-06-26: 2 g via INTRAVENOUS
  Filled 2013-06-26 (×2): qty 50

## 2013-06-26 MED ORDER — FENTANYL CITRATE 0.05 MG/ML IJ SOLN
INTRAMUSCULAR | Status: AC
  Filled 2013-06-26: qty 2

## 2013-06-26 MED ORDER — CEFAZOLIN SODIUM-DEXTROSE 2-3 GM-% IV SOLR: 2.0000 g | Freq: Once | INTRAVENOUS | Status: DC

## 2013-06-26 MED ORDER — MIDAZOLAM HCL 2 MG/2ML IJ SOLN
INTRAMUSCULAR | Status: AC
  Filled 2013-06-26: qty 2

## 2013-06-26 MED ORDER — LEVOTHYROXINE SODIUM 112 MCG PO TABS
224.0000 ug | ORAL_TABLET | Freq: Every day | ORAL | Status: DC
  Administered 2013-06-27: 224 ug via ORAL
  Filled 2013-06-26 (×2): qty 2

## 2013-06-26 NOTE — ED Notes (Signed)
Doing well with local sedation

## 2013-06-26 NOTE — ED Notes (Signed)
Pt not planning on sedation at this time.  Will have meds ready on standby.  Has been NPO.

## 2013-06-26 NOTE — ED Notes (Signed)
Tolerated well.  No sedation given.

## 2013-06-26 NOTE — Progress Notes (Signed)
Utilization review completed.  

## 2013-06-26 NOTE — Progress Notes (Signed)
SUBJECTIVE: The patient is doing well today.  At this time, she denies chest pain, shortness of breath, or any new concerns.  She reports that she has had intermittent dizziness since seen 04/2013.  She has worn a monitor which has demonstrated intermittent Mobitz I heart block with reported high grade heart block (those strips are not available for review). She has also had dizziness when in SR at 60bpm.  She has yet to establish with a PCP.  Her TSH is elevated this admission at 18. When I saw her at [facility], I recommended that she proceed with Port removal and have her thyroid replaced.  Decisions for PPM were to be deferred until her thyroid had been adequately replaced.  She has done reasonably well since that time.  Echo 04-2013 demonstrated an EF of 55-60% with no RWMA, calcified mitral annulus.   CURRENT MEDICATIONS: . atorvastatin  40 mg Oral q1800  . buPROPion  150 mg Oral Daily  . [START ON 06/29/2013] cyanocobalamin  1,000 mcg Subcutaneous Q30 days  . enoxaparin (LOVENOX) injection  60 mg Subcutaneous Q24H  . fenofibrate  54 mg Oral Daily  . ferrous sulfate  325 mg Oral BID WC  . gabapentin  300 mg Oral TID  . hydrochlorothiazide  12.5 mg Oral Daily  . influenza vac split quadrivalent PF  0.5 mL Intramuscular Tomorrow-1000  . latanoprost  1 drop Right Eye QHS  . levothyroxine  200 mcg Oral QAC breakfast  . lisinopril  20 mg Oral Daily  . metFORMIN  1,000 mg Oral BID WC  . multivitamin with minerals  1 tablet Oral Daily  . sodium chloride  3 mL Intravenous Q12H  . [START ON 06/29/2013] Vitamin D (Ergocalciferol)  50,000 Units Oral Q7 days      OBJECTIVE: Physical Exam: Filed Vitals:   06/25/13 1615 06/25/13 1740 06/25/13 2031 06/26/13 0435  BP: 184/81 154/55 151/59 141/46  Pulse: 59 68 60 52  Temp:  99.2 F (37.3 C) 98.2 F (36.8 C) 97.9 F (36.6 C)  TempSrc:  Oral Oral Oral  Resp: 18 18 18 18   Height:      Weight:      SpO2: 96% 96% 97% 96%    Intake/Output Summary  (Last 24 hours) at 06/26/13 6213 Last data filed at 06/25/13 1700  Gross per 24 hour  Intake    240 ml  Output      0 ml  Net    240 ml    Telemetry reveals sinus brady with Mobitz I heart block  GEN- The patient is obese appearing, alert and oriented x 3 today.   Head- normocephalic, atraumatic Eyes-  Sclera clear, conjunctiva pink Ears- hearing intact Oropharynx- clear Neck- supple,  Lungs- Clear to ausculation bilaterally, normal work of breathing Heart- bradycardic irregular rhythm, no murmurs, rubs or gallops, PMI not laterally displaced GI- soft, NT, ND, + BS Extremities- no clubbing, cyanosis, trace edema Skin- no rash or lesion Psych- euthymic mood, full affect Neuro- strength and sensation are intact  LABS: Basic Metabolic Panel:  Recent Labs  08/65/78 1248 06/25/13 1614  NA 134*  --   K 5.0  --   CL 98  --   CO2 27  --   GLUCOSE 186*  --   BUN 16  --   CREATININE 1.05 0.99  CALCIUM 9.4  --   MG  --  1.7   Liver Function Tests:  Recent Labs  06/25/13 1248  AST 11  ALT  9  ALKPHOS 80  BILITOT 0.4  PROT 7.3  ALBUMIN 3.4*   CBC:  Recent Labs  06/25/13 1248 06/25/13 1614  WBC 7.9 8.2  HGB 12.9 13.5  HCT 38.2 39.8  MCV 90.5 90.0  PLT 317 321   Thyroid Function Tests:  Recent Labs  06/25/13 1614  TSH 18.044*    ASSESSMENT AND PLAN:  Principal Problem:   Mobitz type 1 second degree atrioventricular block  1. Mobitz I second degree AV block The patient has some bradycardia related to mobitz I AV block. I am not convinced that this is the cause for her dizziness.  She is very stable.  Unfortunately, her TSH remains very high despite thyroid replacement.  It could be that hypothyroidism is the cause for her bradycardia.  I will discuss with endocrine prior to consideration of PPM  2. Porta cath No longer using The catheter occupies the potential space for PPM.  I will therefore ask that interventional radiology remove this catheter  today.  She is cleared from a cardiology standpoint for cath removal.  Her mobitz I AV block is benign and should not be an issue.  3. Hypothyroidism As above

## 2013-06-26 NOTE — Consult Note (Addendum)
Reason for Consult:Hypothyroidism Referring Physician: Dr. Noemi Chapel [name] is an 72 y.o. female.   History of present illness: Mary [name] hypothyroidism was diagnosed in the 82's.  She has multinodular goiter with a fine needle aspiration which was "okay".  No family history of thyroid cancer, pituitary tumors, or adrenal tumors.  No personal history of radiation exposure (apart from diagnostic imaging) prior to the diagnosis of her thyroid nodules.  Her diet includes iodized salt, dairy, and seafood.   She takes levothyroxine each day at least 30 minutes before breakfast. She takes apart from her iron supplement but at the same time as her multivitamin.  She had missed 1or 2 weeks of levothyroxine in July 2014, just after moving to [place] again.  She has missed 3 days of her levothyroxine in the past month. Usually she is adherent each day.  Her heart rate is been quite low even into the 20s. She understands that her electrophysiologist has recommended achieving a euthyroid state prior to a decision about pacemaker placement if possible.  She reports that he increased her levothyroxine from 175 mcg up to 200 mcg each day starting on 05/04/2013.  She has episodes of lightheadedness and describes near syncope; she attributes these symptoms to her hypothyroidism.  Past Medical History  Diagnosis Date  . Hypertension   . CAD (coronary artery disease)     non obstructive  . Hyperlipidemia   . Hypothyroidism   . Anginal pain   . DVT (deep venous thrombosis) 2013    "twice behind knee on left side" (06/25/2013)  . Foot fracture 2013    "left; fell; never had OR; just wrapped good and wore brace" (06/25/2013)  . Type II diabetes mellitus   . Diabetic peripheral neuropathy   . Pneumonia 1990's    "just once" (06/25/2013)  . Shortness of breath     "can happen at anytime; it's not all the time" (06/25/2013)  . Second degree Mobitz I AV block   . Arrhythmia     "low heart rate;  20's-30's" (06/25/2013)  . Sleep apnea     "cleared up after I lost 134# w/gastric bypass" (06/25/2013)  . Iron deficiency anemia     "had 3 iron infusions in 2013" (06/25/2013)  . Headache(784.0)     "very frequent recently; don't usually have headaches at all" (06/25/2013)  . Arthritis     "very bad; knees and hands" (06/25/2013)  . Chronic lower back pain   . Colon cancer 2012    "started chemo 12/23/2010" (06/25/2013)  . Multinodular goiter     Past Surgical History  Procedure Laterality Date  . Roux-en-y gastric bypass  2009  . Cardiac catheterization  2006    Never had PCI, 3 caths total. Last one in [place]  . Cholecystectomy  1980's  . Hernia repair  1970's?    "umbilical" (06/25/2013)  . Ulnar tunnel release Left ~ 2008  . Cataract extraction, bilateral Bilateral 2013    "and put stent in my left eye for glaucoma" (06/25/2013)  . Eye muscle surgery Bilateral ~ 1963    "muscles too long; eyes would go out and up; tied muscles to hold my eyes straight" (06/25/2013)  . Portacath placement Left 11/2010  . Colectomy  10/2010    Tumor removal    Family History  Problem Relation Age of Onset  . Breast cancer Mother   . Heart disease Father   . Heart attack Father   . Heart attack Sister   .  Heart attack Brother   . Breast cancer Sister     Social History:  reports that she quit smoking about 7 weeks ago. Her smoking use included Cigarettes. She has a 30 pack-year smoking history. She has never used smokeless tobacco. She reports that she drinks alcohol. She reports that she does not use illicit drugs.  Allergies:  Allergies  Allergen Reactions  . Other Other (See Comments)    NO Blind Scopes with Naso Gastric tube.  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 Aspirin     S/P gastric bypass surgery, states her MD told her to not take Aspirin.  . Latex     Tape= burns  . Nsaids     S/P gastric bypass-told not to take    Medications: I have reviewed the patient's current  medications.  ROS General: Weight loss after weight gain when less physically active following deep vein thrombosis, also fatigue. Endocrine:  Cold intolerance, no heat intolerance. Neck:  No hoarseness, but occasional difficulty swallowing. Cardiovascular: Chronic bilateral pedal edema, occasional fluttering sensation in regards to her heartbeat. Gastrointestinal: Intermittent diarrhea, intermittent constipation. Neurologic: No tremor. Psychiatric: No insomnia, no anxiety.  Blood pressure 152/88, pulse 44, temperature 97.9 F (36.6 C), temperature source Oral, resp. rate 17, height 5\' 7"  (1.702 m), weight 125.193 kg (276 lb), SpO2 95.00%. Physical Exam General: No apparent distress. Eyes: Anicteric, no scleral show, no periorbital puffiness, no lid lag. Neck: Supple, trachea midline. Thyroid: Mostly substernal which limits the thyroid exam. Cardiovascular: Regular but slow rate, normal radial pulses. Respiratory: Normal respiratory effort, clear to auscultation. Neurologic: Cranial nerves normal as tested, deep tendon reflexes 1+ and symmetric, no tremor. Musculoskeletal: Normal muscle tone, no muscle atrophy. Skin: Slightly cool, no visible rash. Mental status: Alert, conversant, speech clear, thought logical, appropriate mood and affect, no hallucinations or delusions evident. Hematologic/lymphatic: No cervical adenopathy, no jaundice.  05/2013:  TSH 18 uIU/mL.    Assessment/Plan: 1.  Hypothyroidism. 2.  Multinodular goiter. 3.  Bradycardia.  Her current dose of levothyroxine provides approximately 1.7 mcg of levothyroxine per kilogram of body weight.  Her TSH, history, and exam findings are consistent with hypothyroidism despite this dose of levothyroxine.  Better adherence to levothyroxine therapy and taking levothyroxine apart from multivitamin might help somewhat.  However, I suspect she needs a larger dose of levothyroxine.  I recommend increasing levothyroxine from 200 mcg  once a day to 224 mcg once a day. This can be accomplished by taking levothyroxine 112 mcg tablets two tablets by mouth once a day on an empty stomach at least one hour before breakfast and at least 4 hours apart from multivitamin, iron supplements, calcium supplements, and certain other medicines/supplements.  Recheck TSH and free T4 in 6 weeks. We can increase the dose by an additional 24-26 mcg every 6 weeks until she achieves a euthyroid state.  It seems reasonable to work on the hypothyroidism first prior to a decision about pacemaker placement, unless pacemaker placement is urgent based on her situation.    I spoke with Dr. Johney Frame about my findings and recommendations.    Please call [phone] to schedule an office visit with me (Dr. Sharl Ma) in 6 weeks at [facility], [address], [place], [place], [place] [address].  Thank you.   Mary [name] 06/26/2013, 6:15 PM

## 2013-06-26 NOTE — H&P (Signed)
Mary [name] is an 72 y.o. female.   Chief Complaint: pt was dx with Colon Ca in 2012 Had Port a cath placed then for chemo Moved to [place] and continue to follow with Dr Truett Perna Last use of [facility] was 2013 for Iron replacement Dr Truett Perna had pt scheduled for [facility] removal at [facility] 04/2013 but pt was found to be bradycardic (in 20s per pt) - so removal was cancelled. Now has "clean bill" and needs PAC out. Needs probable pacer per cardiologist HR in 50-60s today HPI: HTN; colon ca; HLD; hypothyroid; DM; arrythmia; IDA  Past Medical History  Diagnosis Date  . Hypertension   . CAD (coronary artery disease)     non obstructive  . Hyperlipidemia   . Hypothyroidism   . Anginal pain   . DVT (deep venous thrombosis) 2013    "twice behind knee on left side" (06/25/2013)  . Foot fracture 2013    "left; fell; never had OR; just wrapped good and wore brace" (06/25/2013)  . Type II diabetes mellitus   . Diabetic peripheral neuropathy   . Pneumonia 1990's    "just once" (06/25/2013)  . Shortness of breath     "can happen at anytime; it's not all the time" (06/25/2013)  . Second degree Mobitz I AV block   . Arrhythmia     "low heart rate; 20's-30's" (06/25/2013)  . Sleep apnea     "cleared up after I lost 134# w/gastric bypass" (06/25/2013)  . Iron deficiency anemia     "had 3 iron infusions in 2013" (06/25/2013)  . Headache(784.0)     "very frequent recently; don't usually have headaches at all" (06/25/2013)  . Arthritis     "very bad; knees and hands" (06/25/2013)  . Chronic lower back pain   . Colon cancer 2012    "started chemo 12/23/2010" (06/25/2013)    Past Surgical History  Procedure Laterality Date  . Roux-en-y gastric bypass  2009  . Cardiac catheterization  2006    Never had PCI, 3 caths total. Last one in [place]  . Cholecystectomy  1980's  . Hernia repair  1970's?    "umbilical" (06/25/2013)  . Ulnar tunnel release Left ~ 2008  . Cataract extraction, bilateral  Bilateral 2013    "and put stent in my left eye for glaucoma" (06/25/2013)  . Eye muscle surgery Bilateral ~ 1963    "muscles too long; eyes would go out and up; tied muscles to hold my eyes straight" (06/25/2013)  . Portacath placement Left 11/2010  . Colectomy  10/2010    Tumor removal    Family History  Problem Relation Age of Onset  . Breast cancer Mother   . Heart disease Father   . Heart attack Father   . Heart attack Sister   . Heart attack Brother    Social History:  reports that she quit smoking about 7 weeks ago. Her smoking use included Cigarettes. She has a 30 pack-year smoking history. She has never used smokeless tobacco. She reports that she drinks alcohol. She reports that she does not use illicit drugs.  Allergies:  Allergies  Allergen Reactions  . Other Other (See Comments)    NO Blind Scopes with Naso Gastric tube.  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 Aspirin     S/P gastric bypass surgery, states her MD told her to not take Aspirin.  . Latex     Tape= burns  . Nsaids     S/P gastric bypass-told not to  take    Medications Prior to Admission  Medication Sig Dispense Refill  . atorvastatin (LIPITOR) 40 MG tablet Take 40 mg by mouth every morning.       . bimatoprost (LUMIGAN) 0.01 % SOLN Apply 1 drop to eye at bedtime. Right eye only      . buPROPion (WELLBUTRIN SR) 150 MG 12 hr tablet Take 150 mg by mouth every morning.       . Calcium Citrate-Vitamin D (CITRACAL + D PO) Take 1 tablet by mouth 2 (two) times daily.      . Choline Fenofibrate 135 MG capsule Take 135 mg by mouth at bedtime.       . cyanocobalamin (,VITAMIN B-12,) 1000 MCG/ML injection Inject 1,000 mcg into the skin every 30 (thirty) days.      . ferrous sulfate 325 (65 FE) MG tablet Take 325 mg by mouth 2 (two) times daily.      Marland Kitchen gabapentin (NEURONTIN) 300 MG capsule Take 300 mg by mouth 3 (three) times daily.      Marland Kitchen HYDROcodone-acetaminophen (VICODIN) 5-500 MG per tablet Take 1 tablet by mouth  every 4 (four) hours as needed for pain.      Marland Kitchen insulin glargine (LANTUS) 100 UNIT/ML injection Inject 12 Units into the skin 2 (two) times daily as needed (only if sugar is above 130).       Marland Kitchen levothyroxine (SYNTHROID, LEVOTHROID) 200 MCG tablet Take 1 tablet (200 mcg total) by mouth daily before breakfast.  30 tablet  12  . lisinopril-hydrochlorothiazide (PRINZIDE,ZESTORETIC) 20-12.5 MG per tablet Take 1 tablet by mouth every morning.       . metFORMIN (GLUCOPHAGE) 1000 MG tablet Take 1,000 mg by mouth 2 (two) times daily with a meal.       . Multiple Vitamin (MULTIVITAMIN WITH MINERALS) TABS tablet Take 1 tablet by mouth daily.      . Vitamin D, Ergocalciferol, (DRISDOL) 50000 UNITS CAPS capsule Take 50,000 Units by mouth every 7 (seven) days. Wednesday night        Results for orders placed during the hospital encounter of 06/25/13 (from the past 48 hour(s))  CBC     Status: None   Collection Time    06/25/13 12:48 PM      Result Value Range   WBC 7.9  4.0 - 10.5 K/uL   RBC 4.22  3.87 - 5.11 MIL/uL   Hemoglobin 12.9  12.0 - 15.0 g/dL   HCT 16.1  09.6 - 04.5 %   MCV 90.5  78.0 - 100.0 fL   MCH 30.6  26.0 - 34.0 pg   MCHC 33.8  30.0 - 36.0 g/dL   RDW 40.9  81.1 - 91.4 %   Platelets 317  150 - 400 K/uL  COMPREHENSIVE METABOLIC PANEL     Status: Abnormal   Collection Time    06/25/13 12:48 PM      Result Value Range   Sodium 134 (*) 135 - 145 mEq/L   Potassium 5.0  3.5 - 5.1 mEq/L   Chloride 98  96 - 112 mEq/L   CO2 27  19 - 32 mEq/L   Glucose, Bld 186 (*) 70 - 99 mg/dL   BUN 16  6 - 23 mg/dL   Creatinine, Ser 7.82  0.50 - 1.10 mg/dL   Calcium 9.4  8.4 - 95.6 mg/dL   Total Protein 7.3  6.0 - 8.3 g/dL   Albumin 3.4 (*) 3.5 - 5.2 g/dL   AST 11  0 - 37 U/L   ALT 9  0 - 35 U/L   Alkaline Phosphatase 80  39 - 117 U/L   Total Bilirubin 0.4  0.3 - 1.2 mg/dL   GFR calc non Af Amer 52 (*) >90 mL/min   GFR calc Af Amer 60 (*) >90 mL/min   Comment: (NOTE)     The eGFR has been  calculated using the CKD EPI equation.     This calculation has not been validated in all clinical situations.     eGFR's persistently <90 mL/min signify possible Chronic Kidney     Disease.  POCT I-STAT TROPONIN I     Status: None   Collection Time    06/25/13 12:57 PM      Result Value Range   Troponin i, poc 0.02  0.00 - 0.08 ng/mL   Comment 3            Comment: Due to the release kinetics of cTnI,     a negative result within the first hours     of the onset of symptoms does not rule out     myocardial infarction with certainty.     If myocardial infarction is still suspected,     repeat the test at appropriate intervals.  CBC     Status: None   Collection Time    06/25/13  4:14 PM      Result Value Range   WBC 8.2  4.0 - 10.5 K/uL   RBC 4.42  3.87 - 5.11 MIL/uL   Hemoglobin 13.5  12.0 - 15.0 g/dL   HCT 16.1  09.6 - 04.5 %   MCV 90.0  78.0 - 100.0 fL   MCH 30.5  26.0 - 34.0 pg   MCHC 33.9  30.0 - 36.0 g/dL   RDW 40.9  81.1 - 91.4 %   Platelets 321  150 - 400 K/uL  CREATININE, SERUM     Status: Abnormal   Collection Time    06/25/13  4:14 PM      Result Value Range   Creatinine, Ser 0.99  0.50 - 1.10 mg/dL   GFR calc non Af Amer 56 (*) >90 mL/min   GFR calc Af Amer 65 (*) >90 mL/min   Comment: (NOTE)     The eGFR has been calculated using the CKD EPI equation.     This calculation has not been validated in all clinical situations.     eGFR's persistently <90 mL/min signify possible Chronic Kidney     Disease.  MAGNESIUM     Status: None   Collection Time    06/25/13  4:14 PM      Result Value Range   Magnesium 1.7  1.5 - 2.5 mg/dL  TSH     Status: Abnormal   Collection Time    06/25/13  4:14 PM      Result Value Range   TSH 18.044 (*) 0.350 - 4.500 uIU/mL   Comment: Performed at [facility]  PRO B NATRIURETIC PEPTIDE     Status: Abnormal   Collection Time    06/25/13  4:14 PM      Result Value Range   Pro B Natriuretic peptide (BNP) 536.6 (*) 0 -  125 pg/mL  OCCULT BLOOD, POC DEVICE     Status: None   Collection Time    06/25/13  4:25 PM      Result Value Range   Fecal Occult Bld NEGATIVE  NEGATIVE  GLUCOSE, CAPILLARY  Status: Abnormal   Collection Time    06/26/13  7:36 AM      Result Value Range   Glucose-Capillary 184 (*) 70 - 99 mg/dL   No results found.  Review of Systems  Constitutional: Negative for fever and weight loss.  Respiratory: Negative for shortness of breath.   Cardiovascular: Negative for chest pain.  Gastrointestinal: Negative for nausea and vomiting.  Neurological: Positive for dizziness and weakness. Negative for headaches.  Psychiatric/Behavioral: Negative for substance abuse.    Blood pressure 141/46, pulse 52, temperature 97.9 F (36.6 C), temperature source Oral, resp. rate 18, height 5\' 7"  (1.702 m), weight 276 lb (125.193 kg), SpO2 96.00%. Physical Exam  Constitutional: She is oriented to person, place, and time. She appears well-developed and well-nourished.  Cardiovascular: Normal rate and regular rhythm.   No murmur heard. Respiratory: Effort normal and breath sounds normal. She has no wheezes.  GI: Soft. Bowel sounds are normal. There is no tenderness.  Musculoskeletal: Normal range of motion.  Neurological: She is alert and oriented to person, place, and time.  Skin: Skin is dry.  Psychiatric: She has a normal mood and affect. Her behavior is normal. Judgment and thought content normal.     Assessment/Plan PAC removal now Placed in outside facility in 2012 for colon ca- chemo Followed by Dr Truett Perna now Was to have removal 04/2013 but was bradycardic and needed work up Now needs pacer per cardio Need PAC out - not used since 2013 Pt aware of procedure benefits and risks and agreeable to proceed Consent signed and in chart Had 1 dose of Lovenox last night for first time- now dc'd Ancef ordered  Ordell Prichett A 06/26/2013, 8:46 AM

## 2013-06-26 NOTE — Procedures (Signed)
Successful removal of the left subclavian portacath.  No immediate complication.

## 2013-06-26 NOTE — Progress Notes (Signed)
       Patient Name: Mary [name] Date of Encounter: 06/26/2013    SUBJECTIVE: Appreciate Dr. Lubertha Basque input. Will depend on guidance from EP for further management of AVB. She has continuous since of dizziness although better this AM>  TELEMETRY:  Intermittent 2 degree AVB: Filed Vitals:   06/25/13 1615 06/25/13 1740 06/25/13 2031 06/26/13 0435  BP: 184/81 154/55 151/59 141/46  Pulse: 59 68 60 52  Temp:  99.2 F (37.3 C) 98.2 F (36.8 C) 97.9 F (36.6 C)  TempSrc:  Oral Oral Oral  Resp: 18 18 18 18   Height:      Weight:      SpO2: 96% 96% 97% 96%    Intake/Output Summary (Last 24 hours) at 06/26/13 0806 Last data filed at 06/25/13 1700  Gross per 24 hour  Intake    240 ml  Output      0 ml  Net    240 ml    LABS: Basic Metabolic Panel:  Recent Labs  16/10/96 1248 06/25/13 1614  NA 134*  --   K 5.0  --   CL 98  --   CO2 27  --   GLUCOSE 186*  --   BUN 16  --   CREATININE 1.05 0.99  CALCIUM 9.4  --   MG  --  1.7   CBC:  Recent Labs  06/25/13 1248 06/25/13 1614  WBC 7.9 8.2  HGB 12.9 13.5  HCT 38.2 39.8  MCV 90.5 90.0  PLT 317 321     Radiology/Studies:  No new.  Physical Exam: Blood pressure 141/46, pulse 52, temperature 97.9 F (36.6 C), temperature source Oral, resp. rate 18, height 5' 7.32" (1.71 m), weight 256 lb 5 oz (116.263 kg), SpO2 96.00%. Weight change:    Unchanged  ASSESSMENT:  1. AVB with occasional fixed 2:1 AVB 2. Dizziness 3. Non obstructive CAD  Plan:  1. Probable pacer per EP, ? Timing.  Windy Fast W 06/26/2013, 8:06 AM

## 2013-06-27 DIAGNOSIS — I10 Essential (primary) hypertension: Secondary | ICD-10-CM | POA: Diagnosis not present

## 2013-06-27 DIAGNOSIS — I441 Atrioventricular block, second degree: Secondary | ICD-10-CM | POA: Diagnosis not present

## 2013-06-27 DIAGNOSIS — R42 Dizziness and giddiness: Secondary | ICD-10-CM

## 2013-06-27 DIAGNOSIS — E039 Hypothyroidism, unspecified: Secondary | ICD-10-CM | POA: Diagnosis not present

## 2013-06-27 DIAGNOSIS — E079 Disorder of thyroid, unspecified: Secondary | ICD-10-CM

## 2013-06-27 LAB — T4, FREE: Free T4: 0.78 ng/dL — ABNORMAL LOW (ref 0.80–1.80)

## 2013-06-27 LAB — GLUCOSE, CAPILLARY: Glucose-Capillary: 177 mg/dL — ABNORMAL HIGH (ref 70–99)

## 2013-06-27 MED ORDER — LEVOTHYROXINE SODIUM 112 MCG PO TABS: 224.0000 ug | ORAL_TABLET | Freq: Every day | ORAL | Status: DC

## 2013-06-27 NOTE — Progress Notes (Signed)
SUBJECTIVE: The patient is doing well today.  At this time, she denies chest pain, shortness of breath, or any new concerns.  She was evaluated by Dr Sharl Ma yesterday who recommended increasing thyroid replacement to achieve euthyroid state.    Left subclavian port a cath removed yesterday.   Echo 04-2013 demonstrated an EF of 55-60% with no RWMA, calcified mitral annulus.   CURRENT MEDICATIONS: . atorvastatin  40 mg Oral q1800  . buPROPion  150 mg Oral Daily  . [START ON 06/29/2013] cyanocobalamin  1,000 mcg Subcutaneous Q30 days  . fenofibrate  54 mg Oral Daily  . ferrous sulfate  325 mg Oral BID WC  . gabapentin  300 mg Oral TID  . hydrochlorothiazide  12.5 mg Oral Daily  . latanoprost  1 drop Right Eye QHS  . levothyroxine  224 mcg Oral QAC breakfast  . lisinopril  20 mg Oral Daily  . metFORMIN  1,000 mg Oral BID WC  . multivitamin with minerals  1 tablet Oral Daily  . sodium chloride  3 mL Intravenous Q12H  . [START ON 06/29/2013] Vitamin D (Ergocalciferol)  50,000 Units Oral Q7 days      OBJECTIVE: Physical Exam: Filed Vitals:   06/26/13 1206 06/26/13 1430 06/26/13 2100 06/27/13 0602  BP: 125/58 152/88 137/66 155/85  Pulse: 47 44 46 47  Temp:  97.9 F (36.6 C) 98.3 F (36.8 C) 98.2 F (36.8 C)  TempSrc:  Oral Oral   Resp: 14 17 18 20   Height:      Weight:    254 lb 4.8 oz (115.35 kg)  SpO2: 92% 95% 93% 94%    Intake/Output Summary (Last 24 hours) at 06/27/13 4132 Last data filed at 06/27/13 0800  Gross per 24 hour  Intake    723 ml  Output      0 ml  Net    723 ml    Telemetry reveals sinus brady with Mobitz I heart block  GEN- The patient is obese appearing, alert and oriented x 3 today.   Head- normocephalic, atraumatic Eyes-  Sclera clear, conjunctiva pink Ears- hearing intact Oropharynx- clear Neck- supple,  Lungs- Clear to ausculation bilaterally, normal work of breathing Heart- bradycardic irregular rhythm, no murmurs, rubs or gallops, PMI not  laterally displaced GI- soft, NT, ND, + BS Extremities- no clubbing, cyanosis, trace edema Skin- no rash or lesion Psych- euthymic mood, full affect Neuro- strength and sensation are intact  LABS: Basic Metabolic Panel:  Recent Labs  44/01/02 1248 06/25/13 1614  NA 134*  --   K 5.0  --   CL 98  --   CO2 27  --   GLUCOSE 186*  --   BUN 16  --   CREATININE 1.05 0.99  CALCIUM 9.4  --   MG  --  1.7   Liver Function Tests:  Recent Labs  06/25/13 1248  AST 11  ALT 9  ALKPHOS 80  BILITOT 0.4  PROT 7.3  ALBUMIN 3.4*   CBC:  Recent Labs  06/25/13 1248 06/25/13 1614  WBC 7.9 8.2  HGB 12.9 13.5  HCT 38.2 39.8  MCV 90.5 90.0  PLT 317 321   Thyroid Function Tests:  Recent Labs  06/25/13 1614  TSH 18.044*    ASSESSMENT AND PLAN:  Principal Problem:   Mobitz type 1 second degree atrioventricular block  1. Mobitz I second degree AV block The patient has some bradycardia related to mobitz I AV block. I am not convinced  that this is the cause for her dizziness.  She is very stable.  Unfortunately, her TSH remains very high despite thyroid replacement.  It could be that hypothyroidism is the cause for her bradycardia.  I spoke with Dr Sharl Ma and we are hopeful that her bradycardia may resolve with treatment of hypothyroidism.  The patient would prefer this more conservative approach.  She is aware that if she has syncope or further decline that she should return immediately.  2. Porta cath Doing well s/p removal Routine wound care  3. Hypothyroidism Dr Sharl Ma has increased levothyroxin to 224 mcg daily  DC to home today Follow-up with Dr Sharl Ma in 6 weeks I will see in 2 months

## 2013-06-27 NOTE — Progress Notes (Signed)
       Patient Name: Mary [name] Date of Encounter: 06/27/2013    SUBJECTIVE: No complaints. Porta cath removed. Thyroid treated.  TELEMETRY:  2nd degree AV block type I Mobitz: Filed Vitals:   06/26/13 1206 06/26/13 1430 06/26/13 2100 06/27/13 0602  BP: 125/58 152/88 137/66 155/85  Pulse: 47 44 46 47  Temp:  97.9 F (36.6 C) 98.3 F (36.8 C) 98.2 F (36.8 C)  TempSrc:  Oral Oral   Resp: 14 17 18 20   Height:      Weight:    254 lb 4.8 oz (115.35 kg)  SpO2: 92% 95% 93% 94%    Intake/Output Summary (Last 24 hours) at 06/27/13 0820 Last data filed at 06/26/13 2108  Gross per 24 hour  Intake    363 ml  Output      0 ml  Net    363 ml    LABS: Basic Metabolic Panel:  Recent Labs  78/46/96 1248 06/25/13 1614  NA 134*  --   K 5.0  --   CL 98  --   CO2 27  --   GLUCOSE 186*  --   BUN 16  --   CREATININE 1.05 0.99  CALCIUM 9.4  --   MG  --  1.7   CBC:  Recent Labs  06/25/13 1248 06/25/13 1614  WBC 7.9 8.2  HGB 12.9 13.5  HCT 38.2 39.8  MCV 90.5 90.0  PLT 317 321     Radiology/Studies:  No new.  Physical Exam: Blood pressure 155/85, pulse 47, temperature 98.2 F (36.8 C), temperature source Oral, resp. rate 20, height 5\' 7"  (1.702 m), weight 254 lb 4.8 oz (115.35 kg), SpO2 94.00%. Weight change: -2 lb 0.2 oz (-0.913 kg)   Unchanged  ASSESSMENT:  1. AV block, with frequent Mobitz 1 and ? Higher grades  Plan:  1. Awaiting decision about pacer. 2. D/C continuous ambulatory monitor.  [name] 06/27/2013, 8:20 AM

## 2013-06-27 NOTE — Discharge Summary (Signed)
CARDIOLOGY DISCHARGE SUMMARY    Patient ID: Mary [name],  MRN: [id number], DOB/AGE: 04/14/1941 72 y.o.  Admit date: 06/25/2013 Discharge date: 06/27/2013  Primary Care Physician: No PCP Per Patient Primary Cardiologist / Primary EP: Verdis Prime, MD / Hillis Range, MD  Primary Discharge Diagnosis: per DC progress note by Dr. Johney Frame 1. Mobitz I second degree AV block  The patient has some bradycardia related to mobitz I AV block. I am not convinced that this is the cause for her dizziness. She is very stable. Unfortunately, her TSH remains very high despite thyroid replacement. It could be that hypothyroidism is the cause for her bradycardia. I spoke with Dr Sharl Ma and we are hopeful that her bradycardia may resolve with treatment of hypothyroidism. The patient would prefer this more conservative approach. She is aware that if she has syncope or further decline that she should return immediately.  2. Port-a-cath  Doing well s/p removal  Routine wound care per Radiology 3. Hypothyroidism  Dr Sharl Ma has increased levothyroxine to 224 mcg daily  Secondary Discharge Diagnoses:  1. HTN 2. DM 3. Colon CA 4. Nonobstructive CAD 5. Dyslipidemia  Procedures This Admission:  1. Port-a-cath removal 06/26/2013  History and Hospital Course:  Mary [name] is a 72 year old woman with HTN, dyslipidemia, nonobstructive CAD, normal LV function hypothyroidism and colon CA. She reports having intermittent dizziness since seen 04/2013. She was evaluated by Dr. Johney Frame. She has worn a monitor which has demonstrated intermittent Mobitz I heart block with reported high grade heart block (those strips are not available for review). She has also had dizziness when in SR at 60bpm. She has yet to establish with a PCP. Her TSH is elevated this admission at 18. When Dr. Johney Frame saw her, he recommended that she proceed with Port-a-cath removal and thyroid replacement. Decisions for PPM were deferred until her thyroid had  been adequately replaced. She has done reasonably well since that time. On 06/25/2013 she was admitted with dizziness. On admission she was in SR at 60 bpm with dizziness. She was kept for close observation for 48 hours. On telemetry there was no evidence of high grade AV block or pause. She proceeded to have Port-a-cath removed without any complication. As outlined above, Dr. Johney Frame states there is no indication for PPM at this time. He has recommended she continue thyroid replacement to achieve euthyroid state. She has been seen, examined and deemed stable for discharge today by Dr. Johney Frame. She will follow-up with Dr. Sharl Ma in 6 weeks and Dr. Johney Frame in 2 months.  Discharge Vitals: Blood pressure 155/85, pulse 47, temperature 98.2 F (36.8 C), temperature source Oral, resp. rate 20, height 5\' 7"  (1.702 m), weight 254 lb 4.8 oz (115.35 kg), SpO2 94.00%.   Labs: Lab Results  Component Value Date   WBC 8.2 06/25/2013   HGB 13.5 06/25/2013   HCT 39.8 06/25/2013   MCV 90.0 06/25/2013   PLT 321 06/25/2013     Recent Labs Lab 06/25/13 1248 06/25/13 1614  NA 134*  --   K 5.0  --   CL 98  --   CO2 27  --   BUN 16  --   CREATININE 1.05 0.99  CALCIUM 9.4  --   PROT 7.3  --   BILITOT 0.4  --   ALKPHOS 80  --   ALT 9  --   AST 11  --   GLUCOSE 186*  --     Recent Labs  06/26/13 1024  INR 0.99    Disposition:  The patient is being discharged in stable condition.  Follow-up:     Follow-up Information   Follow up with KERR,JEFFREY, MD. Schedule an appointment as soon as possible for a visit in 6 weeks. (For thyroid follow-up)    Specialty:  Endocrinology   Contact information:   [address] [place] [address] [phone]       Follow up with Hillis Range, MD On 08/29/2013. (At 8:00 AM)    Specialty:  Cardiology   Contact information:   [address] [place] [address] [phone]      Discharge Medications:    Medication List           atorvastatin 40 MG tablet  Commonly known as:  LIPITOR  Take 40 mg by mouth every morning.     bimatoprost 0.01 % Soln  Commonly known as:  LUMIGAN  Apply 1 drop to eye at bedtime. Right eye only     buPROPion 150 MG 12 hr tablet  Commonly known as:  WELLBUTRIN SR  Take 150 mg by mouth every morning.     Choline Fenofibrate 135 MG capsule  Take 135 mg by mouth at bedtime.     CITRACAL + D PO  Take 1 tablet by mouth 2 (two) times daily.     cyanocobalamin 1000 MCG/ML injection  Commonly known as:  (VITAMIN B-12)  Inject 1,000 mcg into the skin every 30 (thirty) days.     ferrous sulfate 325 (65 FE) MG tablet  Take 325 mg by mouth 2 (two) times daily.     gabapentin 300 MG capsule  Commonly known as:  NEURONTIN  Take 300 mg by mouth 3 (three) times daily.     HYDROcodone-acetaminophen 5-500 MG per tablet  Commonly known as:  VICODIN  Take 1 tablet by mouth every 4 (four) hours as needed for pain.     insulin glargine 100 UNIT/ML injection  Commonly known as:  LANTUS  Inject 12 Units into the skin 2 (two) times daily as needed (only if sugar is above 130).     levothyroxine 112 MCG tablet  Commonly known as:  SYNTHROID, LEVOTHROID  Take 2 tablets (224 mcg total) by mouth daily before breakfast.     lisinopril-hydrochlorothiazide 20-12.5 MG per tablet  Commonly known as:  PRINZIDE,ZESTORETIC  Take 1 tablet by mouth every morning.     metFORMIN 1000 MG tablet  Commonly known as:  GLUCOPHAGE  Take 1,000 mg by mouth 2 (two) times daily with a meal.     multivitamin with minerals Tabs tablet  Take 1 tablet by mouth daily.     Vitamin D (Ergocalciferol) 50000 UNITS Caps capsule  Commonly known as:  DRISDOL  Take 50,000 Units by mouth every 7 (seven) days. Wednesday night       Duration of Discharge Encounter: Greater than 30 minutes including physician time.  Limmie Patricia, PA-C 06/27/2013, 9:13 AM   Hillis Range MD

## 2013-06-28 ENCOUNTER — Encounter: Payer: Self-pay | Admitting: Internal Medicine

## 2013-06-28 ENCOUNTER — Encounter: Payer: Self-pay | Admitting: Oncology

## 2013-06-29 ENCOUNTER — Encounter: Payer: Self-pay | Admitting: Internal Medicine

## 2013-07-09 ENCOUNTER — Encounter: Payer: Self-pay | Admitting: Internal Medicine

## 2013-07-09 ENCOUNTER — Encounter: Payer: Self-pay | Admitting: Interventional Cardiology

## 2013-07-09 ENCOUNTER — Encounter: Payer: Self-pay | Admitting: Oncology

## 2013-07-09 DIAGNOSIS — E119 Type 2 diabetes mellitus without complications: Secondary | ICD-10-CM | POA: Diagnosis not present

## 2013-07-09 DIAGNOSIS — F329 Major depressive disorder, single episode, unspecified: Secondary | ICD-10-CM | POA: Diagnosis not present

## 2013-07-09 DIAGNOSIS — I10 Essential (primary) hypertension: Secondary | ICD-10-CM | POA: Diagnosis not present

## 2013-07-09 DIAGNOSIS — I428 Other cardiomyopathies: Secondary | ICD-10-CM | POA: Diagnosis not present

## 2013-07-09 DIAGNOSIS — E785 Hyperlipidemia, unspecified: Secondary | ICD-10-CM | POA: Diagnosis not present

## 2013-07-09 DIAGNOSIS — G479 Sleep disorder, unspecified: Secondary | ICD-10-CM | POA: Diagnosis not present

## 2013-07-09 DIAGNOSIS — E039 Hypothyroidism, unspecified: Secondary | ICD-10-CM | POA: Diagnosis not present

## 2013-07-09 DIAGNOSIS — E538 Deficiency of other specified B group vitamins: Secondary | ICD-10-CM | POA: Diagnosis not present

## 2013-07-09 DIAGNOSIS — E559 Vitamin D deficiency, unspecified: Secondary | ICD-10-CM | POA: Diagnosis not present

## 2013-07-22 ENCOUNTER — Encounter: Payer: Self-pay | Admitting: Interventional Cardiology

## 2013-07-22 ENCOUNTER — Other Ambulatory Visit: Payer: Self-pay | Admitting: Cardiology

## 2013-07-22 DIAGNOSIS — D649 Anemia, unspecified: Secondary | ICD-10-CM

## 2013-07-22 DIAGNOSIS — C189 Malignant neoplasm of colon, unspecified: Secondary | ICD-10-CM

## 2013-07-23 MED ORDER — ATORVASTATIN CALCIUM 40 MG PO TABS: 40.0000 mg | ORAL_TABLET | Freq: Every morning | ORAL | Status: DC

## 2013-07-23 MED ORDER — LISINOPRIL-HYDROCHLOROTHIAZIDE 20-12.5 MG PO TABS: 1.0000 | ORAL_TABLET | Freq: Every morning | ORAL | Status: DC

## 2013-07-25 ENCOUNTER — Encounter: Payer: Self-pay | Admitting: Oncology

## 2013-07-25 ENCOUNTER — Encounter: Payer: Self-pay | Admitting: Internal Medicine

## 2013-07-29 ENCOUNTER — Other Ambulatory Visit (HOSPITAL_COMMUNITY): Payer: Self-pay | Admitting: Cardiology

## 2013-08-01 ENCOUNTER — Encounter: Payer: Self-pay | Admitting: Internal Medicine

## 2013-08-01 ENCOUNTER — Ambulatory Visit (INDEPENDENT_AMBULATORY_CARE_PROVIDER_SITE_OTHER): Payer: Medicare Other | Admitting: Internal Medicine

## 2013-08-01 VITALS — BP 140/60 | HR 56 | Ht 67.0 in | Wt 261.4 lb

## 2013-08-01 DIAGNOSIS — Z85038 Personal history of other malignant neoplasm of large intestine: Secondary | ICD-10-CM | POA: Diagnosis not present

## 2013-08-01 DIAGNOSIS — E119 Type 2 diabetes mellitus without complications: Secondary | ICD-10-CM | POA: Diagnosis not present

## 2013-08-01 DIAGNOSIS — R001 Bradycardia, unspecified: Secondary | ICD-10-CM

## 2013-08-01 DIAGNOSIS — I498 Other specified cardiac arrhythmias: Secondary | ICD-10-CM | POA: Diagnosis not present

## 2013-08-01 DIAGNOSIS — Z86010 Personal history of colonic polyps: Secondary | ICD-10-CM

## 2013-08-01 NOTE — Patient Instructions (Signed)
Please call our office to schedule your colonoscopy after you have dealt with your cardiac issues

## 2013-08-01 NOTE — Progress Notes (Signed)
HISTORY OF PRESENT ILLNESS:  Mary [name] is a 72 y.o. female , sister of Sonda Primes, with MULTIPLE significant medical problems as listed below. She presents today to reestablish with GI. I performed this patient's initial colonoscopy in 1996 for rectal bleeding. This was unremarkable except for diverticular disease. Followup routine screening colonoscopy was performed in 2003 due to a family history of multiple colon polyps. Patient herself had 2 adenomas. Followup in 3 years recommended. She moved [place], but tells me that she had colonoscopy at least every 5 years. She subsequently moved to [place]. On followup colonoscopy in 2012 was found to have a large colon cancer in the region of the splenic flexure. She underwent hand-assisted laparoscopic left colectomy and partial omentumectomy. The cancer with stage IIIB. Subsequently treated with FOLFOX and oxaliplatin (eventually held secondary to neuropathy). Her last colonoscopy was performed in March of 2013. Normal postop anatomy with a diminutive adenoma removed. Followup in 3 years recommended. Patient moved back to [place] 4 months ago. She has seen Dr. Truett Perna from oncology. She had the impression that she does not have followup colonoscopy until 2023. This upset her. She has also been seen by cardiology for symptomatic bradycardia for which pacemaker placement is anticipated. GI review of systems is remarkable for occasional constipation, diarrhea, and gas. She is status post Roux-en-Y gastric bypass in 2009 (elsewhere). She does have a history of iron deficiency which was attributed to this. She is being managed accordingly. Her last hemoglobin 06/25/2013 was 13.5 with normal MCV.. Of interest, a significant family history of cancer. Breast cancer in her mother, maternal aunt, and sister. Brother succumbed from pancreatic cancer. Another brother with prostate and bladder cancer. Genetic testing was suggested. She initially declined as she has no  children. Aside from issues with bradycardia, other medical problems are stable. She does take insulin and oral agent for diabetes  REVIEW OF SYSTEMS:  All non-GI ROS negative except for arthritis in knees and hands, back pain, fatigue, bradycardia, muscle cramps, shortness of breath, ankle edema, insomnia, sore throat.  Past Medical History  Diagnosis Date  . Hypertension   . CAD (coronary artery disease)     non obstructive  . Hyperlipidemia   . Hypothyroidism   . Anginal pain   . DVT (deep venous thrombosis) 2013    "twice behind knee on left side" (06/25/2013)  . Foot fracture 2013    "left; fell; never had OR; just wrapped good and wore brace" (06/25/2013)  . Type II diabetes mellitus   . Diabetic peripheral neuropathy   . Pneumonia 1990's    "just once" (06/25/2013)  . Shortness of breath     "can happen at anytime; it's not all the time" (06/25/2013)  . Second degree Mobitz I AV block   . Arrhythmia     "low heart rate; 20's-30's" (06/25/2013)  . Sleep apnea     "cleared up after I lost 134# w/gastric bypass" (06/25/2013)  . Iron deficiency anemia     "had 3 iron infusions in 2013" (06/25/2013)  . Headache(784.0)     "very frequent recently; don't usually have headaches at all" (06/25/2013)  . Arthritis     "very bad; knees and hands" (06/25/2013)  . Chronic lower back pain   . Colon cancer 2012    "started chemo 12/23/2010" (06/25/2013)  . Multinodular goiter   . Colon polyps   . CHF (congestive heart failure)   . Glaucoma     Past Surgical History  Procedure Laterality Date  .  Roux-en-y gastric bypass  2009  . Cardiac catheterization  2006    Never had PCI, 3 caths total. Last one in [place]  . Cholecystectomy  1980's  . Umbilical hernia repair  1970's?     (06/25/2013)  . Ulnar tunnel release Left ~ 2008  . Cataract extraction, bilateral Bilateral 2013    "and put stent in my left eye for glaucoma" (06/25/2013)  . Eye muscle surgery Bilateral ~ 1963     "muscles too long; eyes would go out and up; tied muscles to hold my eyes straight" (06/25/2013)  . Portacath placement Left 11/2010  . Colectomy  10/2010    Tumor removal    Social History Mary [name]  reports that she quit smoking about 2 months ago. Her smoking use included Cigarettes. She has a 30 pack-year smoking history. She has never used smokeless tobacco. She reports that she drinks alcohol. She reports that she does not use illicit drugs.  family history includes Bladder Cancer in her cousin; Breast cancer in her maternal aunt, mother, and sister; Clotting disorder in her sister; Colon polyps in her brother; Diabetes in her maternal grandmother, mother, and sister; Heart attack in her brother, father, and sister; Heart disease in her father; Pancreatic cancer in her brother; Prostate cancer in her brother.  Allergies  Allergen Reactions  . Other Other (See Comments)    NO Blind Scopes with Naso Gastric tube.  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 Aspirin     S/P gastric bypass surgery, states her MD told her to not take Aspirin.  . Latex     Tape= burns  . Nsaids     S/P gastric bypass-told not to take       PHYSICAL EXAMINATION: Vital signs: BP 140/60  Pulse 56  Ht 5\' 7"  (1.702 m)  Wt 261 lb 6 oz (118.559 kg)  BMI 40.93 kg/m2 General: Obese, but otherwise Well-developed, well-nourished, no acute distress HEENT: Sclerae are anicteric, conjunctiva pink. Oral mucosa intact Lungs: Clear Heart: Regular Abdomen: soft, obese, nontender, nondistended, no obvious ascites, no peritoneal signs, normal bowel sounds. No organomegaly. Prior surgical incisions well-healed Extremities: No edema Psychiatric: alert and oriented x3. Cooperative   ASSESSMENT:  #1. Personal history of multiple colon polyps and of colon cancer 2012, despite being in surveillance program. Status post laparoscopic left hemicolectomy. Status post adjuvant chemotherapy. #2. Last colonoscopy March 2013. Adenoma  removed #3. Multiple medical problems including active issues with bradycardia and insulin requiring diabetes #4. Strong family history of cancer. Query genetic syndrome   PLAN:  #1. The patient does warrant closer than usual surveillance given her history. She is most comfortable with this. She is a high-risk given her comorbidities. At this point, she needs to have her cardiac issues resolve. Pacemaker placement if needed with appropriate cardiology followup. When cleared by cardiology, she can schedule an appointment with our previsit nurse to schedule outpatient surveillance colonoscopy. She would need Movi prep. We would hold her insulin the evening prior to the exam as well as her metformin the morning of the exam.The nature of the procedure, as well as the risks, benefits, and alternatives were carefully and thoroughly reviewed with the patient. Ample time for discussion and questions allowed. The patient understood, was satisfied, and agreed to proceed.

## 2013-08-04 ENCOUNTER — Encounter: Payer: Self-pay | Admitting: Oncology

## 2013-08-04 ENCOUNTER — Encounter: Payer: Self-pay | Admitting: Internal Medicine

## 2013-08-04 ENCOUNTER — Encounter: Payer: Self-pay | Admitting: Interventional Cardiology

## 2013-08-06 DIAGNOSIS — E1149 Type 2 diabetes mellitus with other diabetic neurological complication: Secondary | ICD-10-CM | POA: Diagnosis not present

## 2013-08-06 DIAGNOSIS — I498 Other specified cardiac arrhythmias: Secondary | ICD-10-CM | POA: Diagnosis not present

## 2013-08-06 DIAGNOSIS — E785 Hyperlipidemia, unspecified: Secondary | ICD-10-CM | POA: Diagnosis not present

## 2013-08-06 DIAGNOSIS — I10 Essential (primary) hypertension: Secondary | ICD-10-CM | POA: Diagnosis not present

## 2013-08-06 DIAGNOSIS — G609 Hereditary and idiopathic neuropathy, unspecified: Secondary | ICD-10-CM | POA: Diagnosis not present

## 2013-08-06 DIAGNOSIS — Z86718 Personal history of other venous thrombosis and embolism: Secondary | ICD-10-CM | POA: Diagnosis not present

## 2013-08-06 DIAGNOSIS — IMO0002 Reserved for concepts with insufficient information to code with codable children: Secondary | ICD-10-CM | POA: Diagnosis not present

## 2013-08-06 DIAGNOSIS — Z85038 Personal history of other malignant neoplasm of large intestine: Secondary | ICD-10-CM | POA: Diagnosis not present

## 2013-08-09 DIAGNOSIS — E1149 Type 2 diabetes mellitus with other diabetic neurological complication: Secondary | ICD-10-CM | POA: Diagnosis not present

## 2013-08-09 DIAGNOSIS — G609 Hereditary and idiopathic neuropathy, unspecified: Secondary | ICD-10-CM | POA: Diagnosis not present

## 2013-08-09 DIAGNOSIS — E039 Hypothyroidism, unspecified: Secondary | ICD-10-CM | POA: Diagnosis not present

## 2013-08-20 ENCOUNTER — Other Ambulatory Visit: Payer: Self-pay | Admitting: Internal Medicine

## 2013-08-20 ENCOUNTER — Encounter: Payer: Self-pay | Admitting: *Deleted

## 2013-08-20 DIAGNOSIS — E039 Hypothyroidism, unspecified: Secondary | ICD-10-CM | POA: Diagnosis not present

## 2013-08-20 DIAGNOSIS — E1142 Type 2 diabetes mellitus with diabetic polyneuropathy: Secondary | ICD-10-CM | POA: Diagnosis not present

## 2013-08-20 DIAGNOSIS — E1149 Type 2 diabetes mellitus with other diabetic neurological complication: Secondary | ICD-10-CM | POA: Diagnosis not present

## 2013-08-20 DIAGNOSIS — E042 Nontoxic multinodular goiter: Secondary | ICD-10-CM | POA: Diagnosis not present

## 2013-08-20 DIAGNOSIS — E049 Nontoxic goiter, unspecified: Secondary | ICD-10-CM

## 2013-08-20 DIAGNOSIS — E785 Hyperlipidemia, unspecified: Secondary | ICD-10-CM | POA: Diagnosis not present

## 2013-08-20 DIAGNOSIS — I498 Other specified cardiac arrhythmias: Secondary | ICD-10-CM | POA: Diagnosis not present

## 2013-08-21 ENCOUNTER — Other Ambulatory Visit: Payer: Self-pay

## 2013-08-21 DIAGNOSIS — Z803 Family history of malignant neoplasm of breast: Secondary | ICD-10-CM

## 2013-08-21 DIAGNOSIS — Z1231 Encounter for screening mammogram for malignant neoplasm of breast: Secondary | ICD-10-CM

## 2013-08-27 ENCOUNTER — Ambulatory Visit
Admission: RE | Admit: 2013-08-27 | Discharge: 2013-08-27 | Disposition: A | Discharge status: Home / Self Care | Payer: Medicare Other | Source: Ambulatory Visit | Attending: Internal Medicine | Admitting: Internal Medicine

## 2013-08-27 DIAGNOSIS — E049 Nontoxic goiter, unspecified: Secondary | ICD-10-CM | POA: Diagnosis not present

## 2013-08-29 ENCOUNTER — Ambulatory Visit (INDEPENDENT_AMBULATORY_CARE_PROVIDER_SITE_OTHER): Payer: Medicare Other | Admitting: Internal Medicine

## 2013-08-29 ENCOUNTER — Ambulatory Visit
Admission: RE | Admit: 2013-08-29 | Discharge: 2013-08-29 | Disposition: A | Discharge status: Home / Self Care | Payer: Medicare Other | Source: Ambulatory Visit

## 2013-08-29 ENCOUNTER — Encounter: Payer: Self-pay | Admitting: Internal Medicine

## 2013-08-29 ENCOUNTER — Encounter: Payer: Self-pay | Admitting: Interventional Cardiology

## 2013-08-29 VITALS — BP 158/84 | HR 55 | Ht 67.0 in | Wt 259.4 lb

## 2013-08-29 DIAGNOSIS — I4430 Unspecified atrioventricular block: Secondary | ICD-10-CM

## 2013-08-29 DIAGNOSIS — I4891 Unspecified atrial fibrillation: Secondary | ICD-10-CM

## 2013-08-29 DIAGNOSIS — R42 Dizziness and giddiness: Secondary | ICD-10-CM | POA: Diagnosis not present

## 2013-08-29 DIAGNOSIS — Z1231 Encounter for screening mammogram for malignant neoplasm of breast: Secondary | ICD-10-CM

## 2013-08-29 DIAGNOSIS — I480 Paroxysmal atrial fibrillation: Secondary | ICD-10-CM | POA: Insufficient documentation

## 2013-08-29 DIAGNOSIS — E079 Disorder of thyroid, unspecified: Secondary | ICD-10-CM

## 2013-08-29 DIAGNOSIS — I441 Atrioventricular block, second degree: Secondary | ICD-10-CM

## 2013-08-29 DIAGNOSIS — I10 Essential (primary) hypertension: Secondary | ICD-10-CM | POA: Diagnosis not present

## 2013-08-29 DIAGNOSIS — Z803 Family history of malignant neoplasm of breast: Secondary | ICD-10-CM

## 2013-08-29 MED ORDER — APIXABAN 5 MG PO TABS: 5.0000 mg | ORAL_TABLET | Freq: Two times a day (BID) | ORAL | Status: DC

## 2013-08-29 NOTE — Patient Instructions (Signed)
Your physician recommends that you schedule a follow-up appointment in: 4 weeks with Dr Katrinka Blazing and 3 months with Dr Johney Frame   Your physician has recommended you make the following change in your medication:  1) Start Eliquis 5mg  twice daily

## 2013-08-29 NOTE — Progress Notes (Signed)
PCP:  Orland Penman, MD Primary Cardiologist:  Dr Katrinka Blazing  The patient presents today for routine electrophysiology followup.  Since being discharged, the patient reports doing very well.  She says that her thyroid has been managed closely by Dr Sharl Ma.  She has stable fatigue and occasional dizziness.  Today, she denies symptoms of palpitations, chest pain, shortness of breath, orthopnea, PND, lower extremity edema, presyncope, syncope, or neurologic sequela.  The patient feels that she is tolerating medications without difficulties and is otherwise without complaint today.   Past Medical History  Diagnosis Date  . Hypertension   . CAD (coronary artery disease)     non obstructive  . Hyperlipidemia   . Hypothyroidism   . Anginal pain   . DVT (deep venous thrombosis) 2013    "twice behind knee on left side" (06/25/2013)  . Foot fracture 2013    "left; fell; never had OR; just wrapped good and wore brace" (06/25/2013)  . Type II diabetes mellitus   . Diabetic peripheral neuropathy   . Pneumonia 1990's    "just once" (06/25/2013)  . Shortness of breath     "can happen at anytime; it's not all the time" (06/25/2013)  . Second degree Mobitz I AV block   . Arrhythmia     "low heart rate; 20's-30's" (06/25/2013)  . Sleep apnea     "cleared up after I lost 134# w/gastric bypass" (06/25/2013)  . Iron deficiency anemia     "had 3 iron infusions in 2013" (06/25/2013)  . Headache(784.0)     "very frequent recently; don't usually have headaches at all" (06/25/2013)  . Arthritis     "very bad; knees and hands" (06/25/2013)  . Chronic lower back pain   . Colon cancer 2012    "started chemo 12/23/2010" (06/25/2013)  . Multinodular goiter   . Colon polyps   . CHF (congestive heart failure)   . Glaucoma    Past Surgical History  Procedure Laterality Date  . Roux-en-y gastric bypass  2009  . Cardiac catheterization  2006    Never had PCI, 3 caths total. Last one in [place]    . Cholecystectomy  1980's  . Umbilical hernia repair  1970's?     (06/25/2013)  . Ulnar tunnel release Left ~ 2008  . Cataract extraction, bilateral Bilateral 2013    "and put stent in my left eye for glaucoma" (06/25/2013)  . Eye muscle surgery Bilateral ~ 1963    "muscles too long; eyes would go out and up; tied muscles to hold my eyes straight" (06/25/2013)  . Portacath placement Left 11/2010  . Colectomy  10/2010    Tumor removal    Current Outpatient Prescriptions  Medication Sig Dispense Refill  . atorvastatin (LIPITOR) 40 MG tablet Take 1 tablet (40 mg total) by mouth every morning.  30 tablet  1  . bimatoprost (LUMIGAN) 0.01 % SOLN Apply 1 drop to eye at bedtime. Right eye only      . buPROPion (WELLBUTRIN SR) 150 MG 12 hr tablet Take 150 mg by mouth every morning.       . Calcium Citrate-Vitamin D (CITRACAL + D PO) Take 1 tablet by mouth 2 (two) times daily.      . Choline Fenofibrate 135 MG capsule Take 135 mg by mouth at bedtime.       . cyanocobalamin (,VITAMIN B-12,) 1000 MCG/ML injection Inject 1,000 mcg into the skin every 30 (thirty) days.      . ferrous sulfate  325 (65 FE) MG tablet Take 325 mg by mouth 2 (two) times daily.      Marland Kitchen gabapentin (NEURONTIN) 300 MG capsule Take 300 mg by mouth 3 (three) times daily.      Marland Kitchen HYDROcodone-acetaminophen (VICODIN) 5-500 MG per tablet Take 1 tablet by mouth every 4 (four) hours as needed for pain.      Marland Kitchen insulin glargine (LANTUS) 100 UNIT/ML injection Inject 12 Units into the skin 2 (two) times daily as needed (only if sugar is above 130).       Marland Kitchen levothyroxine (SYNTHROID, LEVOTHROID) 112 MCG tablet TAKE 1 TABLET BY MOUTH DAILY BEFORE BREAKFAST      . lisinopril-hydrochlorothiazide (PRINZIDE,ZESTORETIC) 20-12.5 MG per tablet Take 1 tablet by mouth every morning.  30 tablet  1  . metFORMIN (GLUCOPHAGE) 1000 MG tablet Take 1,000 mg by mouth 2 (two) times daily with a meal.       . Multiple Vitamin (MULTIVITAMIN WITH MINERALS) TABS  tablet Take 1 tablet by mouth daily.      . Vitamin D, Ergocalciferol, (DRISDOL) 50000 UNITS CAPS capsule Take 50,000 Units by mouth every 7 (seven) days. Wednesday night       No current facility-administered medications for this visit.    Allergies  Allergen Reactions  . Other Other (See Comments)    NO Blind Scopes with Naso Gastric tube.  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 Aspirin     S/P gastric bypass surgery, states her MD told her to not take Aspirin.  . Latex     Tape= burns  . Nsaids     S/P gastric bypass-told not to take    History   Social History  . Marital Status: Married    Spouse Name: Chrissie Noa    Number of Children: 0  . Years of Education: N/A   Occupational History  . Retired     office work   Social History Main Topics  . Smoking status: Former Smoker -- 1.00 packs/day for 30 years    Types: Cigarettes    Quit date: 05/03/2013  . Smokeless tobacco: Never Used     Comment: 06/25/2013 "smoked off and on since I was 22; stopped 10/2007 til 08/30/2012 then started again cause of stress"  . Alcohol Use: Yes     Comment: 06/25/2013 "glass of wine 1-2 times/yr"  . Drug Use: No  . Sexual Activity: Yes   Other Topics Concern  . Not on file   Social History Narrative   Married to husband, Chrissie Noa   No children   Retired Hospital doctor to Electrical engineer   Recently moved back here from [place]     Family History  Problem Relation Age of Onset  . Breast cancer Mother   . Heart disease Father   . Heart attack Father   . Heart attack Sister   . Heart attack Brother   . Breast cancer Sister   . Breast cancer Maternal Aunt     x 2 aunts  . Pancreatic cancer Brother   . Prostate cancer Brother   . Colon polyps Brother   . Bladder Cancer Cousin   . Clotting disorder Sister   . Diabetes Sister     x 3  . Diabetes Mother   . Diabetes Maternal Grandmother    Physical Exam: Filed Vitals:   08/29/13 0749  BP: 158/84  Pulse: 55    Height: 5\' 7"  (1.702 m)  Weight: 259 lb 6.4  oz (117.663 kg)    GEN- The patient is overweight appearing, alert and oriented x 3 today.   Head- normocephalic, atraumatic Eyes-  Sclera clear, conjunctiva pink Ears- hearing intact Oropharynx- clear Neck- supple,   Lungs- Clear to ausculation bilaterally, normal work of breathing Heart- irregular rate and rhythm, no murmurs, rubs or gallops, PMI not laterally displaced GI- soft, NT, ND, + BS Extremities- no clubbing, cyanosis, or edema MS- no significant deformity or atrophy Skin- no rash or lesion Psych- euthymic mood, full affect Neuro- strength and sensation are intact  ekg today reveals afib, V rate 71 bpm, rsr'. [facility] Echo is reviewed  Assessment and Plan:  1. New onset afib Minimally symptomatic and rate controlled chads2vasc score is at least 5.  Today, I discussed novel anticoagulants including pradaxa, xarelto, and eliquis today as indicated for risk reduction in stroke and systemic emboli with nonvalvular atrial fibrillation.  Risks, benefits, and alternatives to each of these drugs were discussed at length today.Given prior gastic bypass, I would not favor pradaxa which has decreased gastric absorption.  I will start eliquis 5mg  BID.  2. Mobitz I second degree AV block Now in afib As her thyroid has been closely managed, I think that if she is documented to have symptomatic bradycardia that we should go ahead and proceed with PPM at that time.  3. Hypothyroidism She has been seen by Dr Sharl Ma.  I will request that he send me his notes  4. HTN Above goal She has not taken her medicine today  Follow-up with Dr Katrinka Blazing in 4-6 weeks I will see in 3 months

## 2013-09-19 ENCOUNTER — Ambulatory Visit: Payer: Medicare Other | Admitting: Interventional Cardiology

## 2013-10-01 ENCOUNTER — Encounter: Payer: Self-pay | Admitting: Interventional Cardiology

## 2013-10-01 DIAGNOSIS — E039 Hypothyroidism, unspecified: Secondary | ICD-10-CM | POA: Diagnosis not present

## 2013-10-01 DIAGNOSIS — R7989 Other specified abnormal findings of blood chemistry: Secondary | ICD-10-CM | POA: Diagnosis not present

## 2013-10-01 DIAGNOSIS — E1149 Type 2 diabetes mellitus with other diabetic neurological complication: Secondary | ICD-10-CM | POA: Diagnosis not present

## 2013-10-09 ENCOUNTER — Encounter: Payer: Self-pay | Admitting: Interventional Cardiology

## 2013-10-09 ENCOUNTER — Ambulatory Visit (INDEPENDENT_AMBULATORY_CARE_PROVIDER_SITE_OTHER): Payer: Medicare Other | Admitting: Interventional Cardiology

## 2013-10-09 VITALS — BP 186/78 | HR 49 | Ht 67.0 in | Wt 254.0 lb

## 2013-10-09 DIAGNOSIS — I4891 Unspecified atrial fibrillation: Secondary | ICD-10-CM | POA: Diagnosis not present

## 2013-10-09 DIAGNOSIS — I10 Essential (primary) hypertension: Secondary | ICD-10-CM | POA: Diagnosis not present

## 2013-10-09 DIAGNOSIS — R42 Dizziness and giddiness: Secondary | ICD-10-CM

## 2013-10-09 DIAGNOSIS — I2510 Atherosclerotic heart disease of native coronary artery without angina pectoris: Secondary | ICD-10-CM | POA: Diagnosis not present

## 2013-10-09 DIAGNOSIS — I441 Atrioventricular block, second degree: Secondary | ICD-10-CM

## 2013-10-09 DIAGNOSIS — Z7901 Long term (current) use of anticoagulants: Secondary | ICD-10-CM | POA: Insufficient documentation

## 2013-10-09 NOTE — Patient Instructions (Signed)
Your physician recommends that you continue on your current medications as directed. Please refer to the Current Medication list given to you today.  Your physician wants you to follow-up in: 6 months. You will receive a reminder letter in the mail two months in advance. If you don't receive a letter, please call our office to schedule the follow-up appointment.  

## 2013-10-09 NOTE — Progress Notes (Signed)
Patient ID: Mary [name], female   DOB: 12-07-1940, 73 y.o.   MRN: [id number]    [address] [place], [place]  [address] Phone: [phone] Fax:  [phone]  Date:  10/09/2013   ID:  Mary [name], DOB 04-02-1941, MRN [id number]  PCP:  Cloyd Stagers, MD   ASSESSMENT:  1. Paroxysmal atrial fibrillation, asymptomatic 2. Hypertension, elevated today but has not had her medications 3. Chronic anticoagulation therapy without complications since starting Eliquis 4. AV block, Mobitz 1 5. Recurring orthostatic lightheadedness and dizziness  PLAN:  1. Currently maintaining sinus rhythm. No change in therapy 2. No bleeding on anticoagulation. Continue as prescribed 3. Clinical followup in 6 months CBC and creatinine today   SUBJECTIVE: Mary [name] is a 73 y.o. female who complains of lightheadedness and dizziness, especially when going from sitting to standing. She also had dizziness after standing for quite some time. She has not had syncope. No blood in urine or stool. No transient neurological complaints.   Wt Readings from Last 3 Encounters:  10/09/13 254 lb (115.214 kg)  08/29/13 259 lb 6.4 oz (117.663 kg)  08/01/13 261 lb 6 oz (118.559 kg)     Past Medical History  Diagnosis Date  . Hypertension   . CAD (coronary artery disease)     non obstructive  . Hyperlipidemia   . Hypothyroidism   . Anginal pain   . DVT (deep venous thrombosis) 2013    "twice behind knee on left side" (06/25/2013)  . Foot fracture 2013    "left; fell; never had OR; just wrapped good and wore brace" (06/25/2013)  . Type II diabetes mellitus   . Diabetic peripheral neuropathy   . Pneumonia 1990's    "just once" (06/25/2013)  . Shortness of breath     "can happen at anytime; it's not all the time" (06/25/2013)  . Second degree Mobitz I AV block   . Arrhythmia     "low heart rate; 20's-30's" (06/25/2013)  . Sleep apnea     "cleared up after I lost 134#  w/gastric bypass" (06/25/2013)  . Iron deficiency anemia     "had 3 iron infusions in 2013" (06/25/2013)  . Headache(784.0)     "very frequent recently; don't usually have headaches at all" (06/25/2013)  . Arthritis     "very bad; knees and hands" (06/25/2013)  . Chronic lower back pain   . Colon cancer 2012    "started chemo 12/23/2010" (06/25/2013)  . Multinodular goiter   . Colon polyps   . CHF (congestive heart failure)   . Glaucoma     Current Outpatient Prescriptions  Medication Sig Dispense Refill  . apixaban (ELIQUIS) 5 MG TABS tablet Take 1 tablet (5 mg total) by mouth 2 (two) times daily.  60 tablet  11  . atorvastatin (LIPITOR) 80 MG tablet Take 80 mg by mouth daily.      . bimatoprost (LUMIGAN) 0.01 % SOLN Apply 1 drop to eye at bedtime. Right eye only      . buPROPion (WELLBUTRIN SR) 150 MG 12 hr tablet Take 150 mg by mouth every morning.       . Calcium Citrate-Vitamin D (CITRACAL + D PO) Take 1 tablet by mouth 2 (two) times daily.      . Choline Fenofibrate 135 MG capsule Take 135 mg by mouth at bedtime.       . cyanocobalamin (,VITAMIN B-12,) 1000 MCG/ML injection Inject 1,000 mcg into the skin  every 30 (thirty) days.      . ferrous sulfate 325 (65 FE) MG tablet Take 325 mg by mouth 2 (two) times daily.      Marland Kitchen gabapentin (NEURONTIN) 300 MG capsule Take 300 mg by mouth 3 (three) times daily.      Marland Kitchen HYDROcodone-acetaminophen (VICODIN) 5-500 MG per tablet Take 1 tablet by mouth every 4 (four) hours as needed for pain.      Marland Kitchen insulin glargine (LANTUS) 100 UNIT/ML injection Inject 20 Units into the skin at bedtime.      Marland Kitchen levothyroxine (SYNTHROID, LEVOTHROID) 175 MCG tablet Take 175 mcg by mouth daily before breakfast.      . lisinopril-hydrochlorothiazide (PRINZIDE,ZESTORETIC) 20-12.5 MG per tablet Take 1 tablet by mouth every morning.  30 tablet  1  . metFORMIN (GLUCOPHAGE) 1000 MG tablet Take 1,000 mg by mouth 2 (two) times daily with a meal.       . Multiple Vitamin  (MULTIVITAMIN WITH MINERALS) TABS tablet Take 1 tablet by mouth daily.       No current facility-administered medications for this visit.    Allergies:    Allergies  Allergen Reactions  . Other Other (See Comments)    NO Blind Scopes with Naso Gastric tube.  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 Aspirin     S/P gastric bypass surgery, states her MD told her to not take Aspirin.  . Latex     Tape= burns  . Nsaids     S/P gastric bypass-told not to take    Social History:  The patient  reports that she quit smoking about 5 months ago. Her smoking use included Cigarettes. She has a 30 pack-year smoking history. She has never used smokeless tobacco. She reports that she drinks alcohol. She reports that she does not use illicit drugs.   ROS:  Please see the history of present illness.   Dizziness but no syncope. Decreased appetite with 10 pound weight loss over 3 months. No blood in stool or urine   All other systems reviewed and negative.   OBJECTIVE: VS:  BP 186/78  Pulse 49  Ht 5\' 7"  (1.702 m)  Wt 254 lb (115.214 kg)  BMI 39.77 kg/m2 Well nourished, well developed, in no acute distress, obese HEENT: normal Neck: JVD flat. Carotid bruit absent  Cardiac:  normal S1, S2; RRR; no murmur Lungs:  clear to auscultation bilaterally, no wheezing, rhonchi or rales Abd: soft, nontender, no hepatomegaly Ext: Edema  Absent . Pulses 2+  Skin: warm and dry Neuro:  CNs 2-12 intact, no focal abnormalities noted  EKG:  Not repeated       Signed, Illene Labrador III, MD 10/09/2013 12:15 PM

## 2013-11-01 ENCOUNTER — Encounter: Payer: Self-pay | Admitting: Oncology

## 2013-11-08 DIAGNOSIS — M1710 Unilateral primary osteoarthritis, unspecified knee: Secondary | ICD-10-CM | POA: Diagnosis not present

## 2013-11-11 ENCOUNTER — Other Ambulatory Visit: Payer: Self-pay | Admitting: Internal Medicine

## 2013-11-12 ENCOUNTER — Other Ambulatory Visit (HOSPITAL_BASED_OUTPATIENT_CLINIC_OR_DEPARTMENT_OTHER): Payer: Medicare Other

## 2013-11-12 ENCOUNTER — Telehealth: Payer: Self-pay | Admitting: Oncology

## 2013-11-12 ENCOUNTER — Ambulatory Visit (HOSPITAL_BASED_OUTPATIENT_CLINIC_OR_DEPARTMENT_OTHER): Payer: Medicare Other | Admitting: Oncology

## 2013-11-12 VITALS — BP 175/72 | HR 85 | Temp 98.2°F | Resp 20 | Ht 67.0 in | Wt 252.7 lb

## 2013-11-12 DIAGNOSIS — C184 Malignant neoplasm of transverse colon: Secondary | ICD-10-CM | POA: Diagnosis not present

## 2013-11-12 DIAGNOSIS — E119 Type 2 diabetes mellitus without complications: Secondary | ICD-10-CM | POA: Diagnosis not present

## 2013-11-12 DIAGNOSIS — R110 Nausea: Secondary | ICD-10-CM

## 2013-11-12 DIAGNOSIS — D649 Anemia, unspecified: Secondary | ICD-10-CM | POA: Diagnosis not present

## 2013-11-12 DIAGNOSIS — E039 Hypothyroidism, unspecified: Secondary | ICD-10-CM

## 2013-11-12 DIAGNOSIS — R1931 Right upper quadrant abdominal rigidity: Secondary | ICD-10-CM | POA: Diagnosis not present

## 2013-11-12 DIAGNOSIS — I4891 Unspecified atrial fibrillation: Secondary | ICD-10-CM | POA: Diagnosis not present

## 2013-11-12 DIAGNOSIS — C189 Malignant neoplasm of colon, unspecified: Secondary | ICD-10-CM

## 2013-11-12 DIAGNOSIS — R634 Abnormal weight loss: Secondary | ICD-10-CM | POA: Diagnosis not present

## 2013-11-12 DIAGNOSIS — I10 Essential (primary) hypertension: Secondary | ICD-10-CM | POA: Diagnosis not present

## 2013-11-12 LAB — CEA: CEA: 1.5 ng/mL (ref 0.0–5.0)

## 2013-11-12 LAB — CBC WITH DIFFERENTIAL/PLATELET
BASO%: 1.2 % (ref 0.0–2.0)
Basophils Absolute: 0.1 10*3/uL (ref 0.0–0.1)
EOS%: 3.3 % (ref 0.0–7.0)
Eosinophils Absolute: 0.3 10*3/uL (ref 0.0–0.5)
HCT: 36.7 % (ref 34.8–46.6)
HGB: 11.8 g/dL (ref 11.6–15.9)
LYMPH%: 26.5 % (ref 14.0–49.7)
MCH: 27.6 pg (ref 25.1–34.0)
MCHC: 32.2 g/dL (ref 31.5–36.0)
MCV: 85.6 fL (ref 79.5–101.0)
MONO#: 0.6 10*3/uL (ref 0.1–0.9)
MONO%: 8.4 % (ref 0.0–14.0)
NEUT#: 4.6 10*3/uL (ref 1.5–6.5)
NEUT%: 60.6 % (ref 38.4–76.8)
PLATELETS: 359 10*3/uL (ref 145–400)
RBC: 4.28 10*6/uL (ref 3.70–5.45)
RDW: 14.9 % — ABNORMAL HIGH (ref 11.2–14.5)
WBC: 7.6 10*3/uL (ref 3.9–10.3)
lymph#: 2 10*3/uL (ref 0.9–3.3)

## 2013-11-12 LAB — BASIC METABOLIC PANEL (CC13)
Anion Gap: 10 mEq/L (ref 3–11)
BUN: 23.8 mg/dL (ref 7.0–26.0)
CO2: 24 mEq/L (ref 22–29)
CREATININE: 1.2 mg/dL — AB (ref 0.6–1.1)
Calcium: 9.6 mg/dL (ref 8.4–10.4)
Chloride: 108 mEq/L (ref 98–109)
Glucose: 124 mg/dl (ref 70–140)
Potassium: 5.6 mEq/L — ABNORMAL HIGH (ref 3.5–5.1)
Sodium: 141 mEq/L (ref 136–145)

## 2013-11-12 NOTE — Progress Notes (Signed)
   Mary [name]    OFFICE PROGRESS NOTE   INTERVAL HISTORY:   Mary [name] returns for scheduled followup of colon cancer. She has been diagnosed with atrial fibrillation and is followed by Dr. [name]. She complains of malaise and intermittent dizziness. She also has alternating constipation and diarrhea. She has intermittent nausea and reports weight loss. Occasional cramping right upper abdomen pain.  Objective:  Vital signs in last 24 hours:  Blood pressure 175/72, pulse 85, temperature 98.2 F (36.8 C), temperature source Oral, resp. rate 20, height 5\' 7"  (1.702 m), weight 252 lb 11.2 oz (114.624 kg), SpO2 97.00%.    HEENT: Neck without mass Lymphatics: No cervical, supraclavicular, axillary, or inguinal nodes Resp: Lungs clear bilaterally Cardio: Regular rate and rhythm GI: No hepatosplenomegaly, nontender, no mass Vascular: No leg edema   Lab Results:  Lab Results  Component Value Date   WBC 7.6 11/12/2013   HGB 11.8 11/12/2013   HCT 36.7 11/12/2013   MCV 85.6 11/12/2013   PLT 359 11/12/2013   NEUTROABS 4.6 11/12/2013   CEA pending   Medications: I have reviewed the patient's current medications.  Assessment/Plan: 1.Stage III (pT3,pN1c,M0) adenocarcinoma transverse colon, status post a laparoscopic left colectomy 11/30/2010  -12 cycles of FOLFOX chemotherapy initiated on 12/23/2010, oxaliplatin was held after? Cycle 7 of chemotherapy secondary to neuropathy  -Surveillance colonoscopy March 2013, removal of a rectal tubular adenoma  -Negative surveillance CT scans 12/12/2012  2. History of iron deficiency anemia secondary gastric bypass surgery, status post Venofer, last given 07/03/2012  3. Gastric bypass surgery in 2009  4. Diabetes  5. Hypertension  6. 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 Extensive family history of cancer  9. Paroxysmal atrial fibrillation  and Mobitz 1 second degree AV block 10. Hypothyroidism   Disposition:  Mary [name] remains in clinical remission from colon cancer. I doubt her multiple symptoms are related to colon cancer. We will followup on the CEA from today. She will be scheduled for surveillance CT scans next month. She is not anemic, but the hemoglobin is lower compared to last year. She will return for a CBC in 3 months. Mary [name] will return for an office visit and CEA in 6 months. She will contact us in the interim for new symptoms.    [name], MD  11/12/2013  3:11 PM

## 2013-11-12 NOTE — Telephone Encounter (Signed)
gv adn printed appt sched appt sched and avs for pt for SEpt....gv pt barium

## 2013-11-13 ENCOUNTER — Other Ambulatory Visit: Payer: Self-pay | Admitting: Internal Medicine

## 2013-11-13 DIAGNOSIS — E039 Hypothyroidism, unspecified: Secondary | ICD-10-CM | POA: Diagnosis not present

## 2013-11-13 DIAGNOSIS — E042 Nontoxic multinodular goiter: Secondary | ICD-10-CM | POA: Diagnosis not present

## 2013-11-14 ENCOUNTER — Telehealth: Payer: Self-pay | Admitting: *Deleted

## 2013-11-14 NOTE — Telephone Encounter (Signed)
Message copied by Tania Ade on Wed Nov 14, 2013  2:58 PM ------      Message from: [name] B      Created: Mon Nov 12, 2013  7:36 PM       Please call patient,creatinine and potassium are elevated, repeat prior to CT      Send to primary MD, they may want to repeat next few days, ?change the lisinopril-hctz ------

## 2013-11-14 NOTE — Telephone Encounter (Signed)
Made patient aware of her CEA result and Bmet results. K+ level high and creatinine is higher. Needs to follow up with PCP/cardiologist with these. May need to change the lisinopril-hctz. Also told her she needs to check this again prior to her CT scan due to risk of renal damage if her creatinine is too high.  Faxed labs to Dr. Daneen Schick and Dr. Amedeo Kinsman with Dr. Gearldine Shown note.

## 2013-11-16 DIAGNOSIS — M1710 Unilateral primary osteoarthritis, unspecified knee: Secondary | ICD-10-CM | POA: Diagnosis not present

## 2013-11-20 DIAGNOSIS — E042 Nontoxic multinodular goiter: Secondary | ICD-10-CM | POA: Diagnosis not present

## 2013-11-20 DIAGNOSIS — E785 Hyperlipidemia, unspecified: Secondary | ICD-10-CM | POA: Diagnosis not present

## 2013-11-20 DIAGNOSIS — Z9181 History of falling: Secondary | ICD-10-CM | POA: Diagnosis not present

## 2013-11-20 DIAGNOSIS — E1142 Type 2 diabetes mellitus with diabetic polyneuropathy: Secondary | ICD-10-CM | POA: Diagnosis not present

## 2013-11-20 DIAGNOSIS — E1149 Type 2 diabetes mellitus with other diabetic neurological complication: Secondary | ICD-10-CM | POA: Diagnosis not present

## 2013-11-20 DIAGNOSIS — E039 Hypothyroidism, unspecified: Secondary | ICD-10-CM | POA: Diagnosis not present

## 2013-11-23 DIAGNOSIS — M1710 Unilateral primary osteoarthritis, unspecified knee: Secondary | ICD-10-CM | POA: Diagnosis not present

## 2013-11-28 ENCOUNTER — Ambulatory Visit: Payer: Medicare Other | Admitting: Internal Medicine

## 2013-11-29 ENCOUNTER — Other Ambulatory Visit: Payer: Self-pay

## 2013-11-29 DIAGNOSIS — M1710 Unilateral primary osteoarthritis, unspecified knee: Secondary | ICD-10-CM | POA: Diagnosis not present

## 2013-11-29 MED ORDER — LISINOPRIL-HYDROCHLOROTHIAZIDE 20-12.5 MG PO TABS: ORAL_TABLET | ORAL | Status: DC

## 2013-12-03 DIAGNOSIS — I10 Essential (primary) hypertension: Secondary | ICD-10-CM | POA: Diagnosis not present

## 2013-12-03 DIAGNOSIS — E785 Hyperlipidemia, unspecified: Secondary | ICD-10-CM | POA: Diagnosis not present

## 2013-12-03 DIAGNOSIS — Z85038 Personal history of other malignant neoplasm of large intestine: Secondary | ICD-10-CM | POA: Diagnosis not present

## 2013-12-03 DIAGNOSIS — E1149 Type 2 diabetes mellitus with other diabetic neurological complication: Secondary | ICD-10-CM | POA: Diagnosis not present

## 2013-12-03 DIAGNOSIS — G894 Chronic pain syndrome: Secondary | ICD-10-CM | POA: Diagnosis not present

## 2013-12-07 DIAGNOSIS — I10 Essential (primary) hypertension: Secondary | ICD-10-CM | POA: Diagnosis not present

## 2013-12-07 DIAGNOSIS — M1710 Unilateral primary osteoarthritis, unspecified knee: Secondary | ICD-10-CM | POA: Diagnosis not present

## 2013-12-11 ENCOUNTER — Encounter: Payer: Self-pay | Admitting: Oncology

## 2013-12-12 ENCOUNTER — Other Ambulatory Visit: Payer: Self-pay

## 2013-12-12 ENCOUNTER — Encounter: Payer: Self-pay | Admitting: Interventional Cardiology

## 2013-12-12 DIAGNOSIS — D649 Anemia, unspecified: Secondary | ICD-10-CM

## 2013-12-12 DIAGNOSIS — C189 Malignant neoplasm of colon, unspecified: Secondary | ICD-10-CM

## 2013-12-12 MED ORDER — CHOLINE FENOFIBRATE 135 MG PO CPDR: 135.0000 mg | DELAYED_RELEASE_CAPSULE | Freq: Every day | ORAL | Status: DC

## 2013-12-13 ENCOUNTER — Encounter (HOSPITAL_COMMUNITY): Payer: Self-pay

## 2013-12-13 ENCOUNTER — Ambulatory Visit (HOSPITAL_COMMUNITY)
Admission: RE | Admit: 2013-12-13 | Discharge: 2013-12-13 | Disposition: A | Discharge status: Home / Self Care | Payer: Medicare Other | Source: Ambulatory Visit | Attending: Oncology | Admitting: Oncology

## 2013-12-13 ENCOUNTER — Telehealth: Payer: Self-pay | Admitting: *Deleted

## 2013-12-13 DIAGNOSIS — D259 Leiomyoma of uterus, unspecified: Secondary | ICD-10-CM | POA: Diagnosis not present

## 2013-12-13 DIAGNOSIS — R197 Diarrhea, unspecified: Secondary | ICD-10-CM | POA: Insufficient documentation

## 2013-12-13 DIAGNOSIS — Z85038 Personal history of other malignant neoplasm of large intestine: Secondary | ICD-10-CM | POA: Diagnosis not present

## 2013-12-13 DIAGNOSIS — Z9884 Bariatric surgery status: Secondary | ICD-10-CM | POA: Diagnosis not present

## 2013-12-13 DIAGNOSIS — K5900 Constipation, unspecified: Secondary | ICD-10-CM | POA: Diagnosis not present

## 2013-12-13 DIAGNOSIS — K429 Umbilical hernia without obstruction or gangrene: Secondary | ICD-10-CM | POA: Diagnosis not present

## 2013-12-13 DIAGNOSIS — K7689 Other specified diseases of liver: Secondary | ICD-10-CM | POA: Insufficient documentation

## 2013-12-13 DIAGNOSIS — Z9089 Acquired absence of other organs: Secondary | ICD-10-CM | POA: Insufficient documentation

## 2013-12-13 DIAGNOSIS — R0602 Shortness of breath: Secondary | ICD-10-CM | POA: Diagnosis not present

## 2013-12-13 DIAGNOSIS — C189 Malignant neoplasm of colon, unspecified: Secondary | ICD-10-CM

## 2013-12-13 MED ORDER — IOHEXOL 300 MG/ML  SOLN
100.0000 mL | Freq: Once | INTRAMUSCULAR | Status: AC | PRN
  Administered 2013-12-13: 100 mL via INTRAVENOUS

## 2013-12-13 NOTE — Telephone Encounter (Signed)
Received fax from Dr. Corinne Ports office. Pt's creatinine 1.43 BUN 35. Results called to [facility] in radiology. Copy to MD for review.

## 2013-12-14 DIAGNOSIS — M1710 Unilateral primary osteoarthritis, unspecified knee: Secondary | ICD-10-CM | POA: Diagnosis not present

## 2013-12-18 ENCOUNTER — Telehealth: Payer: Self-pay | Admitting: *Deleted

## 2013-12-18 NOTE — Telephone Encounter (Signed)
Message copied by Brien Few on Tue Dec 18, 2013 11:54 AM ------      Message from: [name] B      Created: Mon Dec 17, 2013  8:24 PM       Please call patient, CTs negative for cancer, 1 liver lesion is likely benign, will repeat liver CT 6 months ------

## 2013-12-18 NOTE — Telephone Encounter (Signed)
Called pt with CT results. Negative for cancer, per Dr. Benay Spice. Shows liver lesion, likely benign. Will repeat liver CT in 6 mos. Pt voiced understanding, appreciation for call.

## 2013-12-27 ENCOUNTER — Ambulatory Visit (INDEPENDENT_AMBULATORY_CARE_PROVIDER_SITE_OTHER): Payer: Medicare Other | Admitting: Internal Medicine

## 2013-12-27 ENCOUNTER — Encounter: Payer: Self-pay | Admitting: Internal Medicine

## 2013-12-27 ENCOUNTER — Encounter: Payer: Self-pay | Admitting: *Deleted

## 2013-12-27 VITALS — BP 190/68 | HR 45 | Ht 67.0 in | Wt 256.0 lb

## 2013-12-27 DIAGNOSIS — I4891 Unspecified atrial fibrillation: Secondary | ICD-10-CM | POA: Diagnosis not present

## 2013-12-27 DIAGNOSIS — I10 Essential (primary) hypertension: Secondary | ICD-10-CM | POA: Diagnosis not present

## 2013-12-27 DIAGNOSIS — Z7901 Long term (current) use of anticoagulants: Secondary | ICD-10-CM

## 2013-12-27 DIAGNOSIS — R42 Dizziness and giddiness: Secondary | ICD-10-CM

## 2013-12-27 DIAGNOSIS — I2510 Atherosclerotic heart disease of native coronary artery without angina pectoris: Secondary | ICD-10-CM | POA: Diagnosis not present

## 2013-12-27 DIAGNOSIS — I441 Atrioventricular block, second degree: Secondary | ICD-10-CM | POA: Diagnosis not present

## 2013-12-27 MED ORDER — LISINOPRIL-HYDROCHLOROTHIAZIDE 20-12.5 MG PO TABS: ORAL_TABLET | ORAL | Status: DC

## 2013-12-27 NOTE — Patient Instructions (Addendum)
Your physician has recommended that you have a pacemaker inserted. A pacemaker is a small device that is placed under the skin of your chest or abdomen to help control abnormal heart rhythms. This device uses electrical pulses to prompt the heart to beat at a normal rate. Pacemakers are used to treat heart rhythms that are too slow. Wire (leads) are attached to the pacemaker that goes into the chambers of you heart. This is done in the hospital and usually requires and overnight stay. Please see the instruction sheet given to you today for more information.   Your physician recommends that you schedule a follow-up appointment after 01/09/15 in device clinic for wound check    Your physician has recommended you make the following change in your medication:  1) Increase Lisinopril to 2 tablets daily

## 2013-12-28 LAB — CBC WITH DIFFERENTIAL/PLATELET
BASOS PCT: 0.5 % (ref 0.0–3.0)
Basophils Absolute: 0 10*3/uL (ref 0.0–0.1)
Eosinophils Absolute: 0.2 10*3/uL (ref 0.0–0.7)
Eosinophils Relative: 2 % (ref 0.0–5.0)
HEMATOCRIT: 38.9 % (ref 36.0–46.0)
HEMOGLOBIN: 12.6 g/dL (ref 12.0–15.0)
LYMPHS ABS: 2.9 10*3/uL (ref 0.7–4.0)
Lymphocytes Relative: 32.2 % (ref 12.0–46.0)
MCHC: 32.3 g/dL (ref 30.0–36.0)
MCV: 84.8 fl (ref 78.0–100.0)
MONO ABS: 0.7 10*3/uL (ref 0.1–1.0)
MONOS PCT: 7.6 % (ref 3.0–12.0)
NEUTROS ABS: 5.2 10*3/uL (ref 1.4–7.7)
Neutrophils Relative %: 57.7 % (ref 43.0–77.0)
PLATELETS: 306 10*3/uL (ref 150.0–400.0)
RBC: 4.59 Mil/uL (ref 3.87–5.11)
RDW: 15.9 % — ABNORMAL HIGH (ref 11.5–14.6)
WBC: 8.9 10*3/uL (ref 4.5–10.5)

## 2013-12-28 LAB — BASIC METABOLIC PANEL
BUN: 26 mg/dL — AB (ref 6–23)
CO2: 28 meq/L (ref 19–32)
CREATININE: 1 mg/dL (ref 0.4–1.2)
Calcium: 9.5 mg/dL (ref 8.4–10.5)
Chloride: 104 mEq/L (ref 96–112)
GFR: 57.25 mL/min — AB (ref >60.00)
GLUCOSE: 128 mg/dL — AB (ref 70–99)
Potassium: 4.9 mEq/L (ref 3.5–5.1)
Sodium: 139 mEq/L (ref 135–145)

## 2013-12-30 NOTE — Progress Notes (Signed)
PCP:  Nena Jordan, IBTEHAL, MD  The patient presents today for routine electrophysiology followup.  I have seen her several times previously for second degree AV block.  She has been working diligently with Dr Buddy Duty for thyroid replacement.  Despite adequate replacement, her AV block persists.  She has developed new afib but now has very slow and regular RR intervals which are worrisome for a more advanced AV block.  She reports symptoms of fatigue and decreased exercise tolerance with this.  She also has frequent SOB and dizziness.  She is at times unsteady with ambulation.  Today, she denies symptoms of palpitations, chest pain,  orthopnea, PND, presyncope, syncope, or neurologic sequela. + edema  The patient feels that she is tolerating medications without difficulties and is otherwise without complaint today.   Past Medical History  Diagnosis Date  . Hypertension   . CAD (coronary artery disease)     non obstructive  . Hyperlipidemia   . Hypothyroidism   . Anginal pain   . DVT (deep venous thrombosis) 2013    "twice behind knee on left side" (06/25/2013)  . Foot fracture 2013    "left; fell; never had OR; just wrapped good and wore brace" (06/25/2013)  . Pneumonia 1990's    "just once" (06/25/2013)  . Shortness of breath     "can happen at anytime; it's not all the time" (06/25/2013)  . Second degree Mobitz I AV block   . Arrhythmia     "low heart rate; 20's-30's" (06/25/2013)  . Sleep apnea     "cleared up after I lost 134# w/gastric bypass" (06/25/2013)  . Iron deficiency anemia     "had 3 iron infusions in 2013" (06/25/2013)  . Headache(784.0)     "very frequent recently; don't usually have headaches at all" (06/25/2013)  . Arthritis     "very bad; knees and hands" (06/25/2013)  . Chronic lower back pain   . Colon cancer 2012    "started chemo 12/23/2010" (06/25/2013)  . Multinodular goiter   . Colon polyps   . CHF (congestive heart failure)   . Glaucoma   . Type  II diabetes mellitus   . Diabetic peripheral neuropathy   . Falls infrequently   . Polyneuropathy in diabetes(357.2)    Past Surgical History  Procedure Laterality Date  . Roux-en-y gastric bypass  2009  . Cardiac catheterization  2006    Never had PCI, 3 caths total. Last one in [place]  . Cholecystectomy  1980's  . Umbilical hernia repair  1970's?     (06/25/2013)  . Ulnar tunnel release Left ~ 2008  . Cataract extraction, bilateral Bilateral 2013    "and put stent in my left eye for glaucoma" (06/25/2013)  . Eye muscle surgery Bilateral ~ 1963    "muscles too long; eyes would go out and up; tied muscles to hold my eyes straight" (06/25/2013)  . Portacath placement Left 11/2010  . Colectomy  10/2010    Tumor removal    Current Outpatient Prescriptions  Medication Sig Dispense Refill  . apixaban (ELIQUIS) 5 MG TABS tablet Take 1 tablet (5 mg total) by mouth 2 (two) times daily.  60 tablet  11  . atorvastatin (LIPITOR) 80 MG tablet Take 80 mg by mouth daily.      . bimatoprost (LUMIGAN) 0.01 % SOLN Apply 1 drop to eye at bedtime. Right eye only      . buPROPion (WELLBUTRIN SR) 150 MG 12 hr tablet Take 150 mg  by mouth every morning.       . Calcium Citrate-Vitamin D (CITRACAL + D PO) Take 1 tablet by mouth 2 (two) times daily.      . Choline Fenofibrate 135 MG capsule Take 1 capsule (135 mg total) by mouth at bedtime.  30 capsule  3  . cyanocobalamin (,VITAMIN B-12,) 1000 MCG/ML injection Inject 1,000 mcg into the skin every 30 (thirty) days.      . ferrous sulfate 325 (65 FE) MG tablet Take 325 mg by mouth 2 (two) times daily.      Marland Kitchen gabapentin (NEURONTIN) 300 MG capsule Take 300 mg by mouth 3 (three) times daily.      Marland Kitchen HYDROcodone-acetaminophen (VICODIN) 5-500 MG per tablet Take 1 tablet by mouth every 4 (four) hours as needed for pain.      Marland Kitchen insulin glargine (LANTUS) 100 UNIT/ML injection Inject 20 Units into the skin at bedtime.      Marland Kitchen levothyroxine (SYNTHROID, LEVOTHROID) 150  MCG tablet Take 150 mcg by mouth daily before breakfast.      . lisinopril-hydrochlorothiazide (PRINZIDE,ZESTORETIC) 20-12.5 MG per tablet TAKE 2 TABLETs BY MOUTH EVERY EVENING  60 tablet  6  . metFORMIN (GLUCOPHAGE) 1000 MG tablet Take 1,000 mg by mouth 2 (two) times daily with a meal.       . Multiple Vitamin (MULTIVITAMIN WITH MINERALS) TABS tablet Take 1 tablet by mouth daily.       No current facility-administered medications for this visit.    Allergies  Allergen Reactions  . Other Other (See Comments)    NO Blind Scopes with Naso Gastric tube.  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 Adhesive [Tape]   . Aspirin     S/P gastric bypass surgery, states her MD told her to not take Aspirin.  . Latex     Tape= burns  . Nsaids     S/P gastric bypass-told not to take. GI bleeds with NSAIDS    History   Social History  . Marital Status: Married    Spouse Name: [name]    Number of Children: 0  . Years of Education: N/A   Occupational History  . Retired     office work   Social History Main Topics  . Smoking status: Former Smoker -- 1.00 packs/day for 30 years    Types: Cigarettes    Quit date: 05/03/2013  . Smokeless tobacco: Never Used     Comment: 06/25/2013 "smoked off and on since I was 22; stopped 10/2007 til 08/30/2012 then started again cause of stress"  . Alcohol Use: Yes     Comment: 06/25/2013 "glass of wine 1-2 times/yr"  . Drug Use: No  . Sexual Activity: Yes   Other Topics Concern  . Not on file   Social History Narrative   Married to husband, [name]   No children   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   Recently moved back here from [place]     Family History  Problem Relation Age of Onset  . Breast cancer Mother   . Heart disease Father   . Heart attack Father   . Heart attack Sister   . Heart attack Brother   . Breast cancer Sister   . Breast cancer Maternal Aunt     x 2 aunts  . Pancreatic cancer Brother   . Prostate cancer  Brother   . Colon polyps Brother   . Bladder Cancer Cousin   . Clotting  disorder Sister   . Diabetes Sister     x 3  . Diabetes Mother   . Diabetes Maternal Grandmother     ROS-  All systems are reviewed and are negative except as outlined in the HPI above  Physical Exam: Filed Vitals:   12/27/13 1520  BP: 190/68  Pulse: 45  Height: 5\' 7"  (1.702 m)  Weight: 256 lb (116.121 kg)    GEN- The patient is overweight appearing, alert and oriented x 3 today.   Head- normocephalic, atraumatic Eyes-  Sclera clear, conjunctiva pink Ears- hearing intact Oropharynx- clear Neck- supple,  Lungs- Clear to ausculation bilaterally, normal work of breathing Heart-irregular and bradycardic rhythm, no murmurs, rubs or gallops, PMI not laterally displaced GI- soft, NT, ND, + BS Extremities- no clubbing, cyanosis, + edema MS- no significant deformity or atrophy Skin- no rash or lesion Psych- euthymic mood, full affect Neuro- strength and sensation are intact  Epic records and Dr Cindra Eves notes are reviewed ekg today reveals afib with slow and regular ventricular response, incomplete RBBB, LAHB, QRS 96 msec Echo 9/14 reveals EF 55-60%  Assessment and Plan:  1. Second degree AV block She has a h/o second degree AV block.  I am more concerned as her ekg in afib reveals slow and regular RR intervals concerning for more advanced AV block.  She is clearly symptomatic with her bradycardia.  Her thyroid has been replete by Dr Buddy Duty but bradycardia persists. I would therefore recommend pacemaker implantation at this time.  Risks, benefits, alternatives to pacemaker implantation were discussed in detail with the patient today. The patient understands that the risks include but are not limited to bleeding, infection, pneumothorax, perforation, tamponade, vascular damage, renal failure, MI, stroke, death,  and lead dislodgement and wishes to proceed. We will therefore schedule the procedure at the next  available time.  As my schedule will not allow for PPM implant until 4-6 weeks from now, I have discussed with Dr Lovena Le and he has agreed to assist with the procedure early next week.  2. afib She is adequately anticoagulated with eliquis I would advise cardioversion at the time of her PPM implant If she has recurrence of afib then we will consider AAD in the future.  3. HTN Stable No change required today Salt restriction advised

## 2013-12-31 ENCOUNTER — Encounter (HOSPITAL_COMMUNITY): Payer: Self-pay | Admitting: Pharmacy Technician

## 2014-01-01 DIAGNOSIS — Z9884 Bariatric surgery status: Secondary | ICD-10-CM | POA: Diagnosis not present

## 2014-01-01 DIAGNOSIS — T82190A Other mechanical complication of cardiac electrode, initial encounter: Secondary | ICD-10-CM | POA: Diagnosis not present

## 2014-01-01 DIAGNOSIS — E785 Hyperlipidemia, unspecified: Secondary | ICD-10-CM | POA: Diagnosis present

## 2014-01-01 DIAGNOSIS — E1149 Type 2 diabetes mellitus with other diabetic neurological complication: Secondary | ICD-10-CM | POA: Diagnosis present

## 2014-01-01 DIAGNOSIS — I2510 Atherosclerotic heart disease of native coronary artery without angina pectoris: Secondary | ICD-10-CM | POA: Diagnosis present

## 2014-01-01 DIAGNOSIS — Z9849 Cataract extraction status, unspecified eye: Secondary | ICD-10-CM | POA: Diagnosis not present

## 2014-01-01 DIAGNOSIS — I442 Atrioventricular block, complete: Secondary | ICD-10-CM | POA: Diagnosis not present

## 2014-01-01 DIAGNOSIS — I498 Other specified cardiac arrhythmias: Secondary | ICD-10-CM | POA: Diagnosis present

## 2014-01-01 DIAGNOSIS — R5383 Other fatigue: Secondary | ICD-10-CM | POA: Diagnosis not present

## 2014-01-01 DIAGNOSIS — Z95818 Presence of other cardiac implants and grafts: Secondary | ICD-10-CM | POA: Diagnosis not present

## 2014-01-01 DIAGNOSIS — I509 Heart failure, unspecified: Secondary | ICD-10-CM | POA: Diagnosis not present

## 2014-01-01 DIAGNOSIS — I4891 Unspecified atrial fibrillation: Secondary | ICD-10-CM | POA: Diagnosis not present

## 2014-01-01 DIAGNOSIS — R5381 Other malaise: Secondary | ICD-10-CM | POA: Diagnosis not present

## 2014-01-01 DIAGNOSIS — Z79899 Other long term (current) drug therapy: Secondary | ICD-10-CM | POA: Diagnosis not present

## 2014-01-01 DIAGNOSIS — Z85038 Personal history of other malignant neoplasm of large intestine: Secondary | ICD-10-CM | POA: Diagnosis not present

## 2014-01-01 DIAGNOSIS — R0989 Other specified symptoms and signs involving the circulatory and respiratory systems: Secondary | ICD-10-CM | POA: Diagnosis not present

## 2014-01-01 DIAGNOSIS — Z45018 Encounter for adjustment and management of other part of cardiac pacemaker: Secondary | ICD-10-CM | POA: Diagnosis not present

## 2014-01-01 DIAGNOSIS — Z950 Presence of cardiac pacemaker: Secondary | ICD-10-CM | POA: Diagnosis not present

## 2014-01-01 DIAGNOSIS — I10 Essential (primary) hypertension: Secondary | ICD-10-CM | POA: Diagnosis present

## 2014-01-01 DIAGNOSIS — I441 Atrioventricular block, second degree: Secondary | ICD-10-CM | POA: Diagnosis not present

## 2014-01-01 DIAGNOSIS — E1142 Type 2 diabetes mellitus with diabetic polyneuropathy: Secondary | ICD-10-CM | POA: Diagnosis not present

## 2014-01-01 DIAGNOSIS — Z794 Long term (current) use of insulin: Secondary | ICD-10-CM | POA: Diagnosis not present

## 2014-01-01 DIAGNOSIS — H409 Unspecified glaucoma: Secondary | ICD-10-CM | POA: Diagnosis present

## 2014-01-01 DIAGNOSIS — G4730 Sleep apnea, unspecified: Secondary | ICD-10-CM | POA: Diagnosis present

## 2014-01-01 DIAGNOSIS — Z86718 Personal history of other venous thrombosis and embolism: Secondary | ICD-10-CM | POA: Diagnosis not present

## 2014-01-01 DIAGNOSIS — Z9221 Personal history of antineoplastic chemotherapy: Secondary | ICD-10-CM | POA: Diagnosis not present

## 2014-01-01 DIAGNOSIS — E039 Hypothyroidism, unspecified: Secondary | ICD-10-CM | POA: Diagnosis not present

## 2014-01-01 MED ORDER — CEFAZOLIN SODIUM-DEXTROSE 2-3 GM-% IV SOLR
2.0000 g | INTRAVENOUS | Status: DC
  Filled 2014-01-01: qty 50

## 2014-01-01 MED ORDER — SODIUM CHLORIDE 0.9 % IR SOLN
80.0000 mg | Status: DC
  Filled 2014-01-01: qty 2

## 2014-01-02 ENCOUNTER — Encounter (HOSPITAL_COMMUNITY)
Admission: RE | Disposition: A | Discharge status: Home / Self Care | Payer: Self-pay | Source: Ambulatory Visit | Attending: Internal Medicine

## 2014-01-02 ENCOUNTER — Encounter (HOSPITAL_COMMUNITY): Payer: Self-pay | Admitting: General Practice

## 2014-01-02 ENCOUNTER — Inpatient Hospital Stay (HOSPITAL_COMMUNITY)
Admission: RE | Admit: 2014-01-02 | Discharge: 2014-01-04 | DRG: 244 | Disposition: A | Discharge status: Home / Self Care | Payer: Medicare Other | Source: Ambulatory Visit | Attending: Internal Medicine | Admitting: Internal Medicine

## 2014-01-02 DIAGNOSIS — Z79899 Other long term (current) drug therapy: Secondary | ICD-10-CM

## 2014-01-02 DIAGNOSIS — Z45018 Encounter for adjustment and management of other part of cardiac pacemaker: Secondary | ICD-10-CM

## 2014-01-02 DIAGNOSIS — I10 Essential (primary) hypertension: Secondary | ICD-10-CM | POA: Diagnosis present

## 2014-01-02 DIAGNOSIS — T82190A Other mechanical complication of cardiac electrode, initial encounter: Secondary | ICD-10-CM

## 2014-01-02 DIAGNOSIS — H409 Unspecified glaucoma: Secondary | ICD-10-CM | POA: Diagnosis present

## 2014-01-02 DIAGNOSIS — R42 Dizziness and giddiness: Secondary | ICD-10-CM

## 2014-01-02 DIAGNOSIS — Z9849 Cataract extraction status, unspecified eye: Secondary | ICD-10-CM

## 2014-01-02 DIAGNOSIS — Z9221 Personal history of antineoplastic chemotherapy: Secondary | ICD-10-CM

## 2014-01-02 DIAGNOSIS — I442 Atrioventricular block, complete: Principal | ICD-10-CM | POA: Insufficient documentation

## 2014-01-02 DIAGNOSIS — E039 Hypothyroidism, unspecified: Secondary | ICD-10-CM | POA: Diagnosis present

## 2014-01-02 DIAGNOSIS — I509 Heart failure, unspecified: Secondary | ICD-10-CM | POA: Diagnosis present

## 2014-01-02 DIAGNOSIS — Z9884 Bariatric surgery status: Secondary | ICD-10-CM

## 2014-01-02 DIAGNOSIS — I2510 Atherosclerotic heart disease of native coronary artery without angina pectoris: Secondary | ICD-10-CM | POA: Diagnosis present

## 2014-01-02 DIAGNOSIS — I498 Other specified cardiac arrhythmias: Secondary | ICD-10-CM | POA: Diagnosis present

## 2014-01-02 DIAGNOSIS — Z794 Long term (current) use of insulin: Secondary | ICD-10-CM

## 2014-01-02 DIAGNOSIS — E1149 Type 2 diabetes mellitus with other diabetic neurological complication: Secondary | ICD-10-CM | POA: Diagnosis present

## 2014-01-02 DIAGNOSIS — Z85038 Personal history of other malignant neoplasm of large intestine: Secondary | ICD-10-CM

## 2014-01-02 DIAGNOSIS — I441 Atrioventricular block, second degree: Secondary | ICD-10-CM

## 2014-01-02 DIAGNOSIS — Z86718 Personal history of other venous thrombosis and embolism: Secondary | ICD-10-CM

## 2014-01-02 DIAGNOSIS — G4730 Sleep apnea, unspecified: Secondary | ICD-10-CM | POA: Diagnosis present

## 2014-01-02 DIAGNOSIS — I4891 Unspecified atrial fibrillation: Secondary | ICD-10-CM | POA: Diagnosis present

## 2014-01-02 DIAGNOSIS — E1142 Type 2 diabetes mellitus with diabetic polyneuropathy: Secondary | ICD-10-CM | POA: Diagnosis present

## 2014-01-02 DIAGNOSIS — E785 Hyperlipidemia, unspecified: Secondary | ICD-10-CM | POA: Diagnosis present

## 2014-01-02 HISTORY — PX: PACEMAKER INSERTION: SHX728

## 2014-01-02 HISTORY — PX: PERMANENT PACEMAKER INSERTION: SHX5480

## 2014-01-02 LAB — SURGICAL PCR SCREEN
MRSA, PCR: POSITIVE — AB
Staphylococcus aureus: POSITIVE — AB

## 2014-01-02 LAB — GLUCOSE, CAPILLARY: Glucose-Capillary: 140 mg/dL — ABNORMAL HIGH (ref 70–99)

## 2014-01-02 SURGERY — PERMANENT PACEMAKER INSERTION
Anesthesia: LOCAL

## 2014-01-02 MED ORDER — HEPARIN (PORCINE) IN NACL 2-0.9 UNIT/ML-% IJ SOLN
INTRAMUSCULAR | Status: AC
  Filled 2014-01-02: qty 500

## 2014-01-02 MED ORDER — MUPIROCIN 2 % EX OINT
TOPICAL_OINTMENT | CUTANEOUS | Status: AC
  Administered 2014-01-02: 1
  Filled 2014-01-02: qty 22

## 2014-01-02 MED ORDER — CHLORHEXIDINE GLUCONATE 4 % EX LIQD: 60.0000 mL | Freq: Once | CUTANEOUS | Status: DC

## 2014-01-02 MED ORDER — CYANOCOBALAMIN 1000 MCG/ML IJ SOLN: 1000.0000 ug | INTRAMUSCULAR | Status: DC

## 2014-01-02 MED ORDER — MIDAZOLAM HCL 5 MG/5ML IJ SOLN
INTRAMUSCULAR | Status: AC
  Filled 2014-01-02: qty 5

## 2014-01-02 MED ORDER — ACETAMINOPHEN 325 MG PO TABS: 325.0000 mg | ORAL_TABLET | ORAL | Status: DC | PRN

## 2014-01-02 MED ORDER — ATORVASTATIN CALCIUM 80 MG PO TABS
80.0000 mg | ORAL_TABLET | Freq: Every day | ORAL | Status: DC
  Administered 2014-01-02 – 2014-01-04 (×3): 80 mg via ORAL
  Filled 2014-01-02 (×3): qty 1

## 2014-01-02 MED ORDER — ONDANSETRON HCL 4 MG/2ML IJ SOLN: 4.0000 mg | Freq: Four times a day (QID) | INTRAMUSCULAR | Status: DC | PRN

## 2014-01-02 MED ORDER — GABAPENTIN 300 MG PO CAPS
300.0000 mg | ORAL_CAPSULE | Freq: Three times a day (TID) | ORAL | Status: DC
  Administered 2014-01-02 – 2014-01-04 (×6): 300 mg via ORAL
  Filled 2014-01-02 (×8): qty 1

## 2014-01-02 MED ORDER — SODIUM CHLORIDE 0.9 % IV SOLN
INTRAVENOUS | Status: DC
  Administered 2014-01-02: 10:00:00 via INTRAVENOUS

## 2014-01-02 MED ORDER — LISINOPRIL-HYDROCHLOROTHIAZIDE 20-12.5 MG PO TABS: 2.0000 | ORAL_TABLET | Freq: Every day | ORAL | Status: DC

## 2014-01-02 MED ORDER — LISINOPRIL 40 MG PO TABS
40.0000 mg | ORAL_TABLET | Freq: Every day | ORAL | Status: DC
  Administered 2014-01-03 – 2014-01-04 (×2): 40 mg via ORAL
  Filled 2014-01-02 (×2): qty 1

## 2014-01-02 MED ORDER — APIXABAN 5 MG PO TABS
5.0000 mg | ORAL_TABLET | Freq: Two times a day (BID) | ORAL | Status: DC
  Administered 2014-01-02 – 2014-01-04 (×4): 5 mg via ORAL
  Filled 2014-01-02 (×5): qty 1

## 2014-01-02 MED ORDER — FENTANYL CITRATE 0.05 MG/ML IJ SOLN
INTRAMUSCULAR | Status: AC
  Filled 2014-01-02: qty 2

## 2014-01-02 MED ORDER — LIDOCAINE HCL (PF) 1 % IJ SOLN
INTRAMUSCULAR | Status: AC
  Filled 2014-01-02: qty 30

## 2014-01-02 MED ORDER — HYDROCHLOROTHIAZIDE 25 MG PO TABS
25.0000 mg | ORAL_TABLET | Freq: Every day | ORAL | Status: DC
  Administered 2014-01-03 – 2014-01-04 (×2): 25 mg via ORAL
  Filled 2014-01-02 (×2): qty 1

## 2014-01-02 MED ORDER — LEVOTHYROXINE SODIUM 150 MCG PO TABS
150.0000 ug | ORAL_TABLET | Freq: Every day | ORAL | Status: DC
  Administered 2014-01-03 – 2014-01-04 (×2): 150 ug via ORAL
  Filled 2014-01-02 (×3): qty 1

## 2014-01-02 MED ORDER — BUPROPION HCL ER (SR) 150 MG PO TB12
150.0000 mg | ORAL_TABLET | Freq: Every morning | ORAL | Status: DC
  Administered 2014-01-03 – 2014-01-04 (×2): 150 mg via ORAL
  Filled 2014-01-02 (×3): qty 1

## 2014-01-02 MED ORDER — CEFAZOLIN SODIUM-DEXTROSE 2-3 GM-% IV SOLR
2.0000 g | Freq: Four times a day (QID) | INTRAVENOUS | Status: AC
  Administered 2014-01-02 – 2014-01-03 (×3): 2 g via INTRAVENOUS
  Filled 2014-01-02 (×4): qty 50

## 2014-01-02 MED ORDER — ADULT MULTIVITAMIN W/MINERALS CH
1.0000 | ORAL_TABLET | Freq: Every day | ORAL | Status: DC
  Administered 2014-01-03 – 2014-01-04 (×2): 1 via ORAL
  Filled 2014-01-02 (×2): qty 1

## 2014-01-02 MED ORDER — INSULIN GLARGINE 100 UNIT/ML ~~LOC~~ SOLN
20.0000 [IU] | Freq: Every day | SUBCUTANEOUS | Status: DC
  Administered 2014-01-02 – 2014-01-03 (×2): 20 [IU] via SUBCUTANEOUS
  Filled 2014-01-02 (×3): qty 0.2

## 2014-01-02 NOTE — CV Procedure (Signed)
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 Left upper extremity venography.   2. Pacemaker implantation.   3. DC cardioversion    INTRODUCTION: Mary [name] is a 73 y.o. female  with a history of bradycardia who presents today for pacemaker implantation.  The patient reports intermittent episodes of dizziness over the past few months.  She has PAF. No reversible causes have been identified.  The patient therefore presents today for pacemaker implantation.     DESCRIPTION OF PROCEDURE:  Informed written consent was obtained, and   the patient was brought to the electrophysiology lab in a fasting state.  The patient required no sedation for the procedure today.  The patients left chest was prepped and draped in the usual sterile fashion by the EP lab staff. The skin overlying the left deltopectoral region was infiltrated with lidocaine for local analgesia.  A 4-cm incision was made over the left deltopectoral region.  A left subcutaneous pacemaker pocket was fashioned using a combination of sharp and blunt dissection. Electrocautery was required to assure hemostasis.    Left Upper Extremity Venography: A venogram of the left upper extremity was performed, which revealed a large left cephalic vein, which emptied into a large left subclavian vein.  The left axillary vein was moderate in size.  The subclavian vein was subtotally occluded as the patient had previously had a Porta-cath in the left subclavian area and removed approx. 6 months ago.   RA/RV Lead Placement: The left subclavian vein was therefore punctured and cannulated.  Through the left subclavian vein, a St. Jude 1688 45 cm (serial number [id number]) right atrial lead and a St. Jude 1688 52 cm (serial number [id number]) right ventricular lead were advanced with fluoroscopic visualization into  the right atrial appendage and right ventricular apical septal positions respectively.  Initial atrial lead fib- waves measured 1-2 mV with impedance of 412 ohms and a threshold of less than 1 V at 0.5 msec (after DCCV).  Right ventricular lead R-waves measured 6 mV with an impedance of 488 ohms and a threshold of 0.6 V at 0.5 msec.  Both leads were secured to the pectoralis fascia using #2-0 silk over the suture sleeves.   Device Placement:  The leads were then connected to a St. Jude DDD (serial number [id number]) pacemaker.  The pocket was irrigated with copious gentamicin solution.  The pacemaker was then placed into the pocket.  The pocket was then closed in 2 layers with 2.0 Vicryl suture for the subcutaneous and subcuticular layers.  Steri-Strips and a sterile dressing were then applied.  There were no early apparent complications.   Cardioversion After the PPM was inserted, the patient was more deeply sedated under my direct supervision with IV fentanyl and Versed and cardioverted with 200 J of synchronized biphasic energy, restoring NSR.        CONCLUSIONS:   1. Successful implantation of a St. Jude dual-chamber pacemaker for symptomatic bradycardia due to complete heart block followed by DC cardioversion.  2. No early apparent complications.           Cristopher Peru, MD 01/02/2014 12:49 PM

## 2014-01-02 NOTE — Discharge Summary (Deleted)
ELECTROPHYSIOLOGY DISCHARGE SUMMARY   Patient ID: Mary [name],  MRN: [id number], DOB/AGE: 72-Jun-1942 72 y.o.  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 Jude 1688 45 cm (serial number [id number]) right atrial lead  RV lead - St. Jude 1688 52 cm (serial number [id number]) right ventricular lead  Device - St. Jude DDD (serial number [id number]) pacemaker  History and Hospital Course:  Mary [name] is a 73 year old woman with nonobstructive CAD, HTN, dyslipidemia, OSA and second degree AV block who has been followed by Dr. Rayann Heman. She has developed new AFib but now has very slow and regular RR intervals which are worrisome for a more advanced AV block. She reports symptoms of fatigue and decreased exercise tolerance. She also has frequent DOE and dizziness. PPM implantation was recommended for treatment of symptomatic bradycardia. On 01/02/2014, she underwent dual chamber PPM implantation. In addition, she underwent DCCV which was successful for restoring SR. Ms. Pagett tolerated these procedures well without any immediate complication. She remains hemodynamically stable and afebrile. Her chest xray shows stable lead placement without pneumothorax. Her device interrogation shows normal PPM function with stable lead parameters/measurements. Her implant site is intact without significant bleeding or hematoma. She has been given discharge instructions including wound care and  activity restrictions. She will follow-up in 10 days for wound check. There were no changes made to her medications. She has been seen, examined and deemed stable for discharge today by Dr. Cristopher Peru.   Physical Exam: Vitals: Blood pressure 148/66, pulse 59, temperature 97.9 F (36.6 C), temperature source Oral, resp. rate 18, height 5\' 7"  (1.702 m), weight 256 lb (116.121 kg), SpO2 94.00%.  General: Well developed, well appearing 73 year old female in no acute distress. Neck: Supple. JVD not elevated. Lungs: Clear bilaterally to auscultation without wheezes, rales, or rhonchi. Breathing is unlabored. Heart: RRR S1 S2 without murmurs, rubs, or gallops.  Abdomen: Soft, non-distended. Extremities: No clubbing or cyanosis. No edema.  Distal pedal pulses are 2+ and equal bilaterally.  Neuro: Alert and oriented X 3. Moves all extremities spontaneously. No focal deficits. Skin: Left upper chest / implant site intact without bleeding or hematoma.  Labs: Lab Results  Component Value Date   WBC 8.9 12/27/2013   HGB 12.6 12/27/2013   HCT 38.9 12/27/2013   MCV 84.8 12/27/2013   PLT 306.0 12/27/2013     Recent Labs Lab 12/27/13 1618  NA 139  K 4.9  CL 104  CO2 28  BUN 26*  CREATININE 1.0  CALCIUM 9.5  GLUCOSE 128*    Disposition:  The patient is being discharged in stable condition.  Follow-up:     Follow-up Information   Follow up with [facility] On 01/14/2014. (At 12:30 PM for wound check)    Specialty:  Cardiology   Contact information:   [address], [place] [address] [phone]      Follow up with [name], MD On 04/10/2014. (At 9:45 AM)    Specialty:  Cardiology   Contact information:  [address] [place] [address] [phone]      Discharge Medications:    Medication List         apixaban 5 MG Tabs tablet  Commonly known as:  ELIQUIS  Take 5 mg by mouth 2 (two) times daily.     atorvastatin  80 MG tablet  Commonly known as:  LIPITOR  Take 80 mg by mouth daily.     bimatoprost 0.01 % Soln  Commonly known as:  LUMIGAN  Place 1 drop into the right eye at bedtime. Right eye only     buPROPion 150 MG 12 hr tablet  Commonly known as:  WELLBUTRIN SR  Take 150 mg by mouth every morning.     Choline Fenofibrate 135 MG capsule  Take 135 mg by mouth at bedtime.     CITRACAL + D PO  Take 1 tablet by mouth 2 (two) times daily.     cyanocobalamin 1000 MCG/ML injection  Commonly known as:  (VITAMIN B-12)  Inject 1,000 mcg into the skin every 30 (thirty) days.     ferrous sulfate 325 (65 FE) MG tablet  Take 325 mg by mouth 2 (two) times daily.     gabapentin 300 MG capsule  Commonly known as:  NEURONTIN  Take 300 mg by mouth 3 (three) times daily.     HYDROcodone-acetaminophen 5-500 MG per tablet  Commonly known as:  VICODIN  Take 1 tablet by mouth daily as needed for pain.     LANTUS 100 UNIT/ML injection  Generic drug:  insulin glargine  Inject 20 Units into the skin at bedtime.     levothyroxine 150 MCG tablet  Commonly known as:  SYNTHROID, LEVOTHROID  Take 150 mcg by mouth daily before breakfast.     lisinopril-hydrochlorothiazide 20-12.5 MG per tablet  Commonly known as:  PRINZIDE,ZESTORETIC  Take 2 tablets by mouth daily. TAKE 2 TABLETs BY MOUTH EVERY EVENING     metFORMIN 1000 MG tablet  Commonly known as:  GLUCOPHAGE  Take 1,000 mg by mouth 2 (two) times daily with a meal.     multivitamin with minerals Tabs tablet  Take 1 tablet by mouth daily.       Duration of Discharge Encounter: Greater than 30 minutes including physician time.  SignedAndrez Grime, PA-C 01/03/2014, 7:23 AM

## 2014-01-02 NOTE — Discharge Instructions (Signed)
° °  Supplemental Discharge Instructions for  Pacemaker/Defibrillator Patients  Activity No heavy lifting or vigorous activity with your left/right arm for 6 to 8 weeks.  Do not raise your left/right arm above your head for one week.  Gradually raise your affected arm as drawn below.           05/09                      05/10                       05/11                      05/12       NO DRIVING for 1 week; you may begin driving on 16/60/6301. WOUND CARE   Keep the wound area clean and dry.  Do not get this area wet for one week. No showers for one week; you may shower on 01/11/2014.   The tape/steri-strips on your wound will fall off; do not pull them off.  No bandage is needed on the site.  DO NOT apply any creams, oils, or ointments to the wound area.   If you notice any drainage or discharge from the wound, any swelling or bruising at the site, or you develop a fever > 101? F after you are discharged home, call the office at once.  Special Instructions   You are still able to use cellular telephones; use the ear opposite the side where you have your pacemaker/defibrillator.  Avoid carrying your cellular phone near your device.   When traveling through airports, show security personnel your identification card to avoid being screened in the metal detectors.  Ask the security personnel to use the hand wand.   Avoid arc welding equipment, MRI testing (magnetic resonance imaging), TENS units (transcutaneous nerve stimulators).  Call the office for questions about other devices.   Avoid electrical appliances that are in poor condition or are not properly grounded.   Microwave ovens are safe to be near or to operate.

## 2014-01-02 NOTE — H&P (Signed)
Admit H&P  The patient presents today for PPM insertion. She has been seen in the past by Dr. Rayann Heman several times previously for second degree AV block. She has been working diligently with Dr Buddy Duty for thyroid replacement. Despite adequate replacement, her AV block persists. She has developed new afib but now has very slow and regular RR intervals which are worrisome for a more advanced AV block. She reports symptoms of fatigue and decreased exercise tolerance with this. She also has frequent SOB and dizziness. She is at times unsteady with ambulation. Today, she denies symptoms of palpitations, chest pain, orthopnea, PND, presyncope, syncope, or neurologic sequela. + edema The patient feels that she is tolerating medications without difficulties and is otherwise without complaint today.  Past Medical History   Diagnosis  Date   .  Hypertension    .  CAD (coronary artery disease)      non obstructive   .  Hyperlipidemia    .  Hypothyroidism    .  Anginal pain    .  DVT (deep venous thrombosis)  2013     "twice behind knee on left side" (06/25/2013)   .  Foot fracture  2013     "left; fell; never had OR; just wrapped good and wore brace" (06/25/2013)   .  Pneumonia  1990's     "just once" (06/25/2013)   .  Shortness of breath      "can happen at anytime; it's not all the time" (06/25/2013)   .  Second degree [place] I AV block    .  Arrhythmia      "low heart rate; 20's-30's" (06/25/2013)   .  Sleep apnea      "cleared up after I lost 134# w/gastric bypass" (06/25/2013)   .  Iron deficiency anemia      "had 3 iron infusions in 2013" (06/25/2013)   .  Headache(784.0)      "very frequent recently; don't usually have headaches at all" (06/25/2013)   .  Arthritis      "very bad; knees and hands" (06/25/2013)   .  Chronic lower back pain    .  Colon cancer  2012     "started chemo 12/23/2010" (06/25/2013)   .  Multinodular goiter    .  Colon polyps    .  CHF (congestive heart failure)     .  Glaucoma    .  Type II diabetes mellitus    .  Diabetic peripheral neuropathy    .  Falls infrequently    .  Polyneuropathy in diabetes(357.2)     Past Surgical History   Procedure  Laterality  Date   .  Roux-en-y gastric bypass   2009   .  Cardiac catheterization   2006     Never had PCI, 3 caths total. Last one in [place]   .  Cholecystectomy   1980's   .  Umbilical hernia repair   1970's?     (06/25/2013)   .  Ulnar tunnel release  Left  ~ 2008   .  Cataract extraction, bilateral  Bilateral  2013     "and put stent in my left eye for glaucoma" (06/25/2013)   .  Eye muscle surgery  Bilateral  ~ 1963     "muscles too long; eyes would go out and up; tied muscles to hold my eyes straight" (06/25/2013)   .  Portacath placement  Left  11/2010   .  Colectomy  10/2010     Tumor removal    Current Outpatient Prescriptions   Medication  Sig  Dispense  Refill   .  apixaban (ELIQUIS) 5 MG TABS tablet  Take 1 tablet (5 mg total) by mouth 2 (two) times daily.  60 tablet  11   .  atorvastatin (LIPITOR) 80 MG tablet  Take 80 mg by mouth daily.     .  bimatoprost (LUMIGAN) 0.01 % SOLN  Apply 1 drop to eye at bedtime. Right eye only     .  buPROPion (WELLBUTRIN SR) 150 MG 12 hr tablet  Take 150 mg by mouth every morning.     .  Calcium Citrate-Vitamin D (CITRACAL + D PO)  Take 1 tablet by mouth 2 (two) times daily.     .  Choline Fenofibrate 135 MG capsule  Take 1 capsule (135 mg total) by mouth at bedtime.  30 capsule  3   .  cyanocobalamin (,VITAMIN B-12,) 1000 MCG/ML injection  Inject 1,000 mcg into the skin every 30 (thirty) days.     .  ferrous sulfate 325 (65 FE) MG tablet  Take 325 mg by mouth 2 (two) times daily.     Marland Kitchen  gabapentin (NEURONTIN) 300 MG capsule  Take 300 mg by mouth 3 (three) times daily.     Marland Kitchen  HYDROcodone-acetaminophen (VICODIN) 5-500 MG per tablet  Take 1 tablet by mouth every 4 (four) hours as needed for pain.     Marland Kitchen  insulin glargine (LANTUS) 100 UNIT/ML injection   Inject 20 Units into the skin at bedtime.     Marland Kitchen  levothyroxine (SYNTHROID, LEVOTHROID) 150 MCG tablet  Take 150 mcg by mouth daily before breakfast.     .  lisinopril-hydrochlorothiazide (PRINZIDE,ZESTORETIC) 20-12.5 MG per tablet  TAKE 2 TABLETs BY MOUTH EVERY EVENING  60 tablet  6   .  metFORMIN (GLUCOPHAGE) 1000 MG tablet  Take 1,000 mg by mouth 2 (two) times daily with a meal.     .  Multiple Vitamin (MULTIVITAMIN WITH MINERALS) TABS tablet  Take 1 tablet by mouth daily.      No current facility-administered medications for this visit.    Allergies   Allergen  Reactions   .  Other  Other (See Comments)     NO Blind Scopes with Naso Gastric tube. 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 2009   .  Adhesive [Tape]    .  Aspirin      S/P gastric bypass surgery, states her MD told her to not take Aspirin.   .  Latex      Tape= burns   .  Nsaids      S/P gastric bypass-told not to take. GI bleeds with NSAIDS    History    Social History   .  Marital Status:  Married     Spouse Name:  [name]     Number of Children:  0   .  Years of Education:  N/A    Occupational History   .  Retired      office work    Social History Main Topics   .  Smoking status:  Former Smoker -- 1.00 packs/day for 30 years     Types:  Cigarettes     Quit date:  05/03/2013   .  Smokeless tobacco:  Never Used      Comment: 06/25/2013 "smoked off and on since I was 22; stopped 10/2007 til 08/30/2012 then started again cause of stress"   .  Alcohol Use:  Yes      Comment: 06/25/2013 "glass of wine 1-2 times/yr"   .  Drug Use:  No   .  Sexual Activity:  Yes    Other Topics  Concern   .  Not on file    Social History Narrative    Married to husband, [name]    No children    Retired TEFL teacher to Publishing copy    Recently moved back here from [place]    Family History   Problem  Relation  Age of Onset   .  Breast cancer  Mother    .  Heart disease  Father    .  Heart attack  Father    .   Heart attack  Sister    .  Heart attack  Brother    .  Breast cancer  Sister    .  Breast cancer  Maternal Aunt      x 2 aunts   .  Pancreatic cancer  Brother    .  Prostate cancer  Brother    .  Colon polyps  Brother    .  Bladder Cancer  Cousin    .  Clotting disorder  Sister    .  Diabetes  Sister      x 3   .  Diabetes  Mother    .  Diabetes  Maternal Grandmother     ROS- All systems are reviewed and are negative except as outlined in the HPI above  Physical Exam:  Filed Vitals:    12/27/13 1520   BP:  190/68   Pulse:  45   Height:  5\' 7"  (1.702 m)   Weight:  256 lb (116.121 kg)    GEN- The patient is overweight appearing, alert and oriented x 3 today.  Head- normocephalic, atraumatic  Eyes- Sclera clear, conjunctiva pink  Ears- hearing intact  Oropharynx- clear  Neck- supple,  Lungs- Clear to ausculation bilaterally, normal work of breathing  Heart-irregular and bradycardic rhythm, no murmurs, rubs or gallops, PMI not laterally displaced  GI- soft, NT, ND, + BS  Extremities- no clubbing, cyanosis, + edema  MS- no significant deformity or atrophy  Skin- no rash or lesion  Psych- euthymic mood, full affect  Neuro- strength and sensation are intact  Epic records and Dr Cindra Eves notes are reviewed  ekg today reveals afib with slow and regular ventricular response, incomplete RBBB, LAHB, QRS 96 msec  Echo 9/14 reveals EF 55-60%  Assessment and Plan:  1. Second degree AV block  She has a h/o second degree AV block. I am more concerned as her ekg in afib reveals slow and regular RR intervals concerning for more advanced AV block. She is clearly symptomatic with her bradycardia. Her thyroid has been replete by Dr Buddy Duty but bradycardia persists. I would therefore recommend pacemaker implantation at this time. Risks, benefits, alternatives to pacemaker implantation were discussed in detail with the patient today. The patient understands that the risks include but are not limited to  bleeding, infection, pneumothorax, perforation, tamponade, vascular damage, renal failure, MI, stroke, death, and lead dislodgement and wishes to proceed. We will therefore schedule the procedure at the next available time. As my schedule will not allow for PPM implant until 4-6 weeks from now, I have discussed with Dr Lovena Le and he has agreed to assist with the procedure early next week.  2. afib  She is adequately anticoagulated  with eliquis  I would advise cardioversion at the time of her PPM implant  If she has recurrence of afib then we will consider AAD in the future.  3. HTN  Stable  No change required today  Salt restriction advised  Cristopher Peru, M.D.

## 2014-01-03 ENCOUNTER — Ambulatory Visit (HOSPITAL_COMMUNITY): Payer: Medicare Other

## 2014-01-03 ENCOUNTER — Encounter (HOSPITAL_COMMUNITY)
Admission: RE | Disposition: A | Discharge status: Home / Self Care | Payer: Self-pay | Source: Ambulatory Visit | Attending: Internal Medicine

## 2014-01-03 ENCOUNTER — Encounter (HOSPITAL_COMMUNITY): Payer: Self-pay | Admitting: Cardiology

## 2014-01-03 DIAGNOSIS — I509 Heart failure, unspecified: Secondary | ICD-10-CM | POA: Diagnosis not present

## 2014-01-03 DIAGNOSIS — E1142 Type 2 diabetes mellitus with diabetic polyneuropathy: Secondary | ICD-10-CM | POA: Diagnosis not present

## 2014-01-03 DIAGNOSIS — T82190A Other mechanical complication of cardiac electrode, initial encounter: Secondary | ICD-10-CM

## 2014-01-03 DIAGNOSIS — I442 Atrioventricular block, complete: Secondary | ICD-10-CM | POA: Diagnosis not present

## 2014-01-03 DIAGNOSIS — R0989 Other specified symptoms and signs involving the circulatory and respiratory systems: Secondary | ICD-10-CM | POA: Diagnosis not present

## 2014-01-03 DIAGNOSIS — I441 Atrioventricular block, second degree: Secondary | ICD-10-CM | POA: Diagnosis not present

## 2014-01-03 DIAGNOSIS — Z95818 Presence of other cardiac implants and grafts: Secondary | ICD-10-CM | POA: Diagnosis not present

## 2014-01-03 DIAGNOSIS — E039 Hypothyroidism, unspecified: Secondary | ICD-10-CM | POA: Diagnosis not present

## 2014-01-03 HISTORY — PX: LEAD REVISION: SHX5945

## 2014-01-03 SURGERY — LEAD REVISION
Anesthesia: LOCAL

## 2014-01-03 MED ORDER — MIDAZOLAM HCL 5 MG/5ML IJ SOLN
INTRAMUSCULAR | Status: AC
  Filled 2014-01-03: qty 5

## 2014-01-03 MED ORDER — LIDOCAINE HCL (PF) 1 % IJ SOLN
INTRAMUSCULAR | Status: AC
  Filled 2014-01-03: qty 60

## 2014-01-03 MED ORDER — ACETAMINOPHEN 325 MG PO TABS: 325.0000 mg | ORAL_TABLET | ORAL | Status: DC | PRN

## 2014-01-03 MED ORDER — SODIUM CHLORIDE 0.9 % IV SOLN
INTRAVENOUS | Status: DC
  Administered 2014-01-03: 11:00:00 via INTRAVENOUS

## 2014-01-03 MED ORDER — SODIUM CHLORIDE 0.9 % IV SOLN: 250.0000 mL | INTRAVENOUS | Status: DC | PRN

## 2014-01-03 MED ORDER — CEFAZOLIN SODIUM 1-5 GM-% IV SOLN
1.0000 g | Freq: Four times a day (QID) | INTRAVENOUS | Status: AC
  Administered 2014-01-03 – 2014-01-04 (×3): 1 g via INTRAVENOUS
  Filled 2014-01-03 (×4): qty 50

## 2014-01-03 MED ORDER — ONDANSETRON HCL 4 MG/2ML IJ SOLN
4.0000 mg | Freq: Four times a day (QID) | INTRAMUSCULAR | Status: DC | PRN
Start: 2014-01-03 — End: 2014-01-04

## 2014-01-03 MED ORDER — CEFAZOLIN SODIUM-DEXTROSE 2-3 GM-% IV SOLR
2.0000 g | INTRAVENOUS | Status: DC
  Administered 2014-01-03: 2 g via INTRAVENOUS
  Filled 2014-01-03: qty 50

## 2014-01-03 MED ORDER — SODIUM CHLORIDE 0.9 % IJ SOLN: 3.0000 mL | INTRAMUSCULAR | Status: DC | PRN

## 2014-01-03 MED ORDER — SODIUM CHLORIDE 0.9 % IJ SOLN
3.0000 mL | Freq: Two times a day (BID) | INTRAMUSCULAR | Status: DC
  Administered 2014-01-03: 3 mL via INTRAVENOUS

## 2014-01-03 MED ORDER — CHLORHEXIDINE GLUCONATE 4 % EX LIQD
60.0000 mL | Freq: Once | CUTANEOUS | Status: DC
  Filled 2014-01-03: qty 60

## 2014-01-03 MED ORDER — CEFAZOLIN SODIUM 1-5 GM-% IV SOLN
INTRAVENOUS | Status: AC
  Filled 2014-01-03: qty 100

## 2014-01-03 MED ORDER — HYDROCODONE-ACETAMINOPHEN 5-325 MG PO TABS: 1.0000 | ORAL_TABLET | ORAL | Status: DC | PRN

## 2014-01-03 MED ORDER — SODIUM CHLORIDE 0.9 % IR SOLN
80.0000 mg | Status: DC
  Filled 2014-01-03 (×2): qty 2

## 2014-01-03 NOTE — Op Note (Signed)
SURGEON:  [name], MD     PREPROCEDURE DIAGNOSIS:  Symptomatic second degree AV block,  Atrial lead dislodgement    POSTPROCEDURE DIAGNOSIS:  Symptomatic second degree AV block,  Atrial lead dislodgement     PROCEDURES:   1. Pacemaker system revision     INTRODUCTION:  Mary [name] is a 73 y.o. female with a history of symptomatic second degree AV block s/p PPM by Dr Lovena Le 01/02/14.  Overnight, she was found to develop an atrial lead dislodgement.   The patient therefore presents today for pacemaker lead revision.    DESCRIPTION OF PROCEDURE:  Informed written consent was obtained, and  the patient was brought to the electrophysiology lab in a fasting state.  The patient received IV Versed as sedation for the procedure today.  The patients left chest was prepped and draped in the usual sterile fashion by the EP lab staff. The skin overlying the existing pacemaker was infiltrated with lidocaine for local analgesia.  A 4-cm incision was made over the prior incision.   Electrocautery was required to assure hemostasis.  A single silk stitch was removed which was previously used to secure the device to the pectoralis fascia.  The device was removed from the body and disconnected from the leads.  The silk sutures securing the leads to the fascia were also removed in their entirety.  Both leads were evaluated under fluoroscopy.  The atrial lead was dislodged.  There was very little slack on the RV lead.  RA/RV Lead Repositioning: There was a [place] [id number] (serial number  [id number]) right atrial lead and a [place] [id number] (serial number  [id number]) right ventricular lead in place.  Stylets were placed into both leads.  The helix of the atrial lead was retracted.  The lead was repositioned in the right atrial appendage.  The RV lead was not revised however I did elect to place more lead slack.  Atrial lead P- waves measured 1.4 mV with impedance of 413 ohms and a  threshold of 0.8 V at 0.5 msec.  Right ventricular lead R-waves measured 6.1 mV with an impedance of 475 ohms and a threshold of 0.4 V at 0.5 msec.  Both leads were secured to the pectoralis fascia using #2-0 silk over the suture sleeves.   Device Placement:  The leads were then reconnected to the previous [place] (serial number  [id number] ) pacemaker.  The pocket was irrigated with copious gentamicin solution.  The pacemaker was then placed into the pocket.  The pocket was then closed in 2 layers with 2.0 Vicryl suture for the subcutaneous and subcuticular layers.  Steri-  Strips and a sterile dressing were then applied.  There were no early apparent complications.     CONCLUSIONS:   1. Successful atrial lead revision  2. No early apparent complications.        [name], MD 01/03/2014 4:16 PM

## 2014-01-03 NOTE — H&P (View-Only) (Signed)
    Patient: Mary [name] Date of Encounter: 01/03/2014, 8:21 AM Admit date: 01/02/2014     Subjective  Ms. Marich has no complaints this AM. She denies CP or SOB. She states " I feel better"   Objective  Physical Exam: Vitals: BP 148/66  Pulse 59  Temp(Src) 97.9 F (36.6 C) (Oral)  Resp 18  Ht 5\' 7"  (1.702 m)  Wt 256 lb (116.121 kg)  BMI 40.09 kg/m2  SpO2 94% General: Well developed, well appearing 73 year old female in no acute distress. Neck: Supple. JVD not elevated. Lungs: Clear bilaterally to auscultation without wheezes, rales, or rhonchi. Breathing is unlabored. Heart: RRR S1 S2 without murmurs, rubs, or gallops.  Abdomen: Soft, non-distended. Extremities: No clubbing or cyanosis. No edema.  Distal pedal pulses are 2+ and equal bilaterally. Neuro: Alert and oriented X 3. Moves all extremities spontaneously. No focal deficits. Skin: Left upper chest / implant site intact without bleeding or hematoma.  Intake/Output:  Intake/Output Summary (Last 24 hours) at 01/03/14 0821 Last data filed at 01/03/14 0450  Gross per 24 hour  Intake    520 ml  Output    700 ml  Net   -180 ml    Inpatient Medications:  . apixaban  5 mg Oral BID  . atorvastatin  80 mg Oral Daily  . buPROPion  150 mg Oral q morning - 10a  . [START ON 02/02/2014] cyanocobalamin  1,000 mcg Subcutaneous Q30 days  . gabapentin  300 mg Oral TID  . lisinopril  40 mg Oral Daily   And  . hydrochlorothiazide  25 mg Oral Daily  . insulin glargine  20 Units Subcutaneous QHS  . levothyroxine  150 mcg Oral QAC breakfast  . multivitamin with minerals  1 tablet Oral Daily    Labs:    Component Value Date/Time   WBC 8.9 12/27/2013 1618   RBC 4.59 12/27/2013 1618   HGB 12.6 12/27/2013 1618   HCT 38.9 12/27/2013 1618   PLT 306.0 12/27/2013 1618   MCV 84.8 12/27/2013 1618   MCHC 32.3 12/27/2013 1618   RDW 15.9* 12/27/2013 1618   LYMPHSABS 2.9 12/27/2013 1618   MONOABS 0.7 12/27/2013 1618   EOSABS 0.2 12/27/2013 1618     BASOSABS 0.0 12/27/2013 1618      Component Value Date/Time   NA 139 12/27/2013 1618   K 4.9 12/27/2013 1618   CL 104 12/27/2013 1618   CO2 28 12/27/2013 1618   GLUCOSE 128* 12/27/2013 1618   BUN 26* 12/27/2013 1618   CREATININE 1.0 12/27/2013 1618   CALCIUM 9.5 12/27/2013 1618   Radiology/Studies:  Chest x-ray: shows probable atrial lead dislodgement  Device interrogation: performed by industry - no atrial sensing or capture; otherwise normal PPM function Telemetry: V paced   Assessment and Plan  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 Shiplett is doing well post PPM implant. However, it appears her atrial lead has dislodged. Postpone DC. She will need lead revision today.   Signed, Andrez Grime PA-C  EP Attending  Patient seen and examined. Agree with above. She has an atrial lead dislodgement and I will ask Dr. Rayann Heman if he can revise her later today. She has had breakfast but will be made NPO.   Mikle Bosworth.D.

## 2014-01-03 NOTE — Clinical Documentation Improvement (Signed)
BMI of 40 documented by Dr. Lovena Le. We need a diagnosis associated with the BMI. Please clarify if you feel the patient has:  Morbid Obesity W/ BMI  Underweight w/BMI  Other condition___________________  Hoyt Koch, Zoila Shutter ,RN Clinical Documentation Specialist:  [facility]

## 2014-01-03 NOTE — Interval H&P Note (Signed)
History and Physical Interval Note:  01/03/2014 12:30 PM  Mary [name]  has presented today for surgery, with the diagnosis of bad lead  The various methods of treatment have been discussed with the patient and family. After consideration of risks, benefits and other options for treatment, the patient has consented to  Procedure(s): LEAD REVISION (N/A) as a surgical intervention.  The patient's history has been reviewed, patient examined, no change in status, stable for surgery.  I have reviewed the patient's chart and labs.  Questions were answered to the patient's satisfaction.    The patient has a dislodged atrial lead.  I have spoken with Dr Lovena Le and we agree that revision is advised.  Risks, benefits, alternatives to pacemaker lead revision were discussed in detail with the patient today. The patient understands that the risks include but are not limited to bleeding, infection, pneumothorax, perforation, tamponade, vascular damage, renal failure, MI, stroke, death,  And further lead dislodgement and wishes to proceed. We will therefore schedule the procedure at the next available time.   [name]

## 2014-01-03 NOTE — Progress Notes (Signed)
    Patient: Mary [name] Date of Encounter: 01/03/2014, 8:21 AM Admit date: 01/02/2014     Subjective  Mary [name] has no complaints this AM. She denies CP or SOB. She states " I feel better"   Objective  Physical Exam: Vitals: BP 148/66  Pulse 59  Temp(Src) 97.9 F (36.6 C) (Oral)  Resp 18  Ht 5' 7" (1.702 m)  Wt 256 lb (116.121 kg)  BMI 40.09 kg/m2  SpO2 94% General: Well developed, well appearing 73 year old female in no acute distress. Neck: Supple. JVD not elevated. Lungs: Clear bilaterally to auscultation without wheezes, rales, or rhonchi. Breathing is unlabored. Heart: RRR S1 S2 without murmurs, rubs, or gallops.  Abdomen: Soft, non-distended. Extremities: No clubbing or cyanosis. No edema.  Distal pedal pulses are 2+ and equal bilaterally. Neuro: Alert and oriented X 3. Moves all extremities spontaneously. No focal deficits. Skin: Left upper chest / implant site intact without bleeding or hematoma.  Intake/Output:  Intake/Output Summary (Last 24 hours) at 01/03/14 0821 Last data filed at 01/03/14 0450  Gross per 24 hour  Intake    520 ml  Output    700 ml  Net   -180 ml    Inpatient Medications:  . apixaban  5 mg Oral BID  . atorvastatin  80 mg Oral Daily  . buPROPion  150 mg Oral q morning - 10a  . [START ON 02/02/2014] cyanocobalamin  1,000 mcg Subcutaneous Q30 days  . gabapentin  300 mg Oral TID  . lisinopril  40 mg Oral Daily   And  . hydrochlorothiazide  25 mg Oral Daily  . insulin glargine  20 Units Subcutaneous QHS  . levothyroxine  150 mcg Oral QAC breakfast  . multivitamin with minerals  1 tablet Oral Daily    Labs:    Component Value Date/Time   WBC 8.9 12/27/2013 1618   RBC 4.59 12/27/2013 1618   HGB 12.6 12/27/2013 1618   HCT 38.9 12/27/2013 1618   PLT 306.0 12/27/2013 1618   MCV 84.8 12/27/2013 1618   MCHC 32.3 12/27/2013 1618   RDW 15.9* 12/27/2013 1618   LYMPHSABS 2.9 12/27/2013 1618   MONOABS 0.7 12/27/2013 1618   EOSABS 0.2 12/27/2013 1618     BASOSABS 0.0 12/27/2013 1618      Component Value Date/Time   NA 139 12/27/2013 1618   K 4.9 12/27/2013 1618   CL 104 12/27/2013 1618   CO2 28 12/27/2013 1618   GLUCOSE 128* 12/27/2013 1618   BUN 26* 12/27/2013 1618   CREATININE 1.0 12/27/2013 1618   CALCIUM 9.5 12/27/2013 1618   Radiology/Studies:  Chest x-ray: shows probable atrial lead dislodgement  Device interrogation: performed by industry - no atrial sensing or capture; otherwise normal PPM function Telemetry: V paced   Assessment and Plan  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 However, it appears her atrial lead has dislodged. Postpone DC. She will need lead revision today.   Signed, Brooke O Edmisten PA-C  EP Attending  Patient seen and examined. Agree with above. She has an atrial lead dislodgement and I will ask Dr. Allred if he can revise her later today. She has had breakfast but will be made NPO.   Gregg Taylor,M.D.   

## 2014-01-04 ENCOUNTER — Encounter (HOSPITAL_COMMUNITY): Payer: Self-pay | Admitting: *Deleted

## 2014-01-04 ENCOUNTER — Inpatient Hospital Stay (HOSPITAL_COMMUNITY): Payer: Medicare Other

## 2014-01-04 DIAGNOSIS — Z950 Presence of cardiac pacemaker: Secondary | ICD-10-CM | POA: Diagnosis not present

## 2014-01-04 DIAGNOSIS — I4891 Unspecified atrial fibrillation: Secondary | ICD-10-CM

## 2014-01-04 DIAGNOSIS — I441 Atrioventricular block, second degree: Secondary | ICD-10-CM | POA: Diagnosis not present

## 2014-01-04 NOTE — Discharge Summary (Signed)
ELECTROPHYSIOLOGY PROCEDURE DISCHARGE SUMMARY    Patient ID: Mary [name],  MRN: [id number], DOB/AGE: [birth year] 73 y.o.  Admit date: 01/02/2014 Discharge date: 01/04/2014  Primary Care Physician: Cloyd Stagers, MD Primary Cardiologist: [name] Electrophysiologist: Maycie Luera  Primary Discharge Diagnosis:  2nd degree AV block status post pacemaker implantation this admission  Secondary Discharge Diagnosis:  1.  Hypertension 2.  Non obstructive CAD 3.  Hyperlipidemia 4.  Hypothyroidism 5.  Diabetes  Allergies  Allergen Reactions  . Other Other (See Comments)    NO Blind Scopes with Naso Gastric tube.  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 Adhesive [Tape]   . Aspirin     S/P gastric bypass surgery, states her MD told her to not take Aspirin.  . Latex     Tape= burns  . Nsaids     S/P gastric bypass-told not to take. GI bleeds with NSAIDS     Procedures This Admission: 1.  Implantation of STJ dual chamber pacemaker on 01-02-2014 by Dr Lovena Le.  The patient received a STJ Assurity pacemaker with model number 3875 right atrial lead and model number 6433 right ventricular lead.  There were no early apparent complications.  2.  CXR on 01-03-2014 demonstrated right atrial lead dislodgement 3.  Atrial lead repositioning on 01-03-2014 by Dr Rayann Heman.  The previously implanted leads were used. There were no early apparent complications.  4.  CXR on 01-04-2014 demonstrated no ptx, stable leads Brief HPI: Mary [name] is a 73 y.o. female who was referred to EP for evaluation of second degree AV block. She has been working diligently with Dr Buddy Duty for thyroid replacement. Despite adequate replacement, her AV block persists. She has developed new afib but now has very slow and regular RR intervals which are worrisome for a more advanced AV block. She reports symptoms of fatigue and decreased exercise tolerance with this. She also has frequent SOB and dizziness. She is at times unsteady with  ambulation.  Risks, benefits, and alternatives to pacemaker implantation were reviewed with the patient who wished to proceed.   Hospital Course:  The patient was admitted and underwent implantation of a STJ dual chamber pacemaker with details as outlined above.  She was found to have atrial lead dislodgement on 01-03-14 and underwent right atrial lead revision on 01-03-2014.  She was monitored on telemetry overnight which demonstrated sinus with V pacing.  Left chest was without hematoma or ecchymosis.  The device was interrogated and found to be functioning normally.  CXR was obtained and demonstrated no pneumothorax status post device implantation.  Wound care, arm mobility, and restrictions were reviewed with the patient.  Dr Rayann Heman examined the patient and considered them stable for discharge to home.    Discharge Vitals: Blood pressure 129/78, pulse 66, temperature 98.1 F (36.7 C), temperature source Oral, resp. rate 15, height 5\' 7"  (1.702 m), weight 256 lb (116.121 kg), SpO2 92.00%.   Physical Exam: Filed Vitals:   01/03/14 0642 01/03/14 1335 01/03/14 2212 01/04/14 0556  BP: 148/66 140/65 151/77 129/78  Pulse: 59 62 69 66  Temp: 97.9 F (36.6 C) 98 F (36.7 C) 98.3 F (36.8 C) 98.1 F (36.7 C)  TempSrc: Oral Oral Oral Oral  Resp: 18 18 16 15   Height:      Weight:      SpO2: 94% 94% 92% 92%    GEN- The patient is well appearing, alert and oriented x 3 today.   Head- normocephalic, atraumatic Eyes-  Sclera clear, conjunctiva pink Ears- hearing intact Oropharynx- clear Neck- supple,  Lungs- Clear to ausculation bilaterally, normal work of breathing Heart- Regular rate and rhythm, no murmurs, rubs or gallops, PMI not laterally displaced GI- soft, NT, ND, + BS Extremities- no clubbing, cyanosis, or edema Pacemaker site is without hematoma or bleeding Neuro- strength and sensation are intact    Labs:   Lab Results  Component Value Date   WBC 8.9 12/27/2013   HGB 12.6  12/27/2013   HCT 38.9 12/27/2013   MCV 84.8 12/27/2013   PLT 306.0 12/27/2013   No results found for this basename: NA, K, CL, CO2, BUN, CREATININE, CALCIUM, LABALBU, PROT, BILITOT, ALKPHOS, ALT, AST, GLUCOSE,  in the last 168 hours  Discharge Medications:    Medication List         apixaban 5 MG Tabs tablet  Commonly known as:  ELIQUIS  Take 5 mg by mouth 2 (two) times daily.     atorvastatin 80 MG tablet  Commonly known as:  LIPITOR  Take 80 mg by mouth daily.     bimatoprost 0.01 % Soln  Commonly known as:  LUMIGAN  Place 1 drop into the right eye at bedtime. Right eye only     buPROPion 150 MG 12 hr tablet  Commonly known as:  WELLBUTRIN SR  Take 150 mg by mouth every morning.     Choline Fenofibrate 135 MG capsule  Take 135 mg by mouth at bedtime.     CITRACAL + D PO  Take 1 tablet by mouth 2 (two) times daily.     cyanocobalamin 1000 MCG/ML injection  Commonly known as:  (VITAMIN B-12)  Inject 1,000 mcg into the skin every 30 (thirty) days.     ferrous sulfate 325 (65 FE) MG tablet  Take 325 mg by mouth 2 (two) times daily.     gabapentin 300 MG capsule  Commonly known as:  NEURONTIN  Take 300 mg by mouth 3 (three) times daily.     HYDROcodone-acetaminophen 5-500 MG per tablet  Commonly known as:  VICODIN  Take 1 tablet by mouth daily as needed for pain.     LANTUS 100 UNIT/ML injection  Generic drug:  insulin glargine  Inject 20 Units into the skin at bedtime.     levothyroxine 150 MCG tablet  Commonly known as:  SYNTHROID, LEVOTHROID  Take 150 mcg by mouth daily before breakfast.     lisinopril-hydrochlorothiazide 20-12.5 MG per tablet  Commonly known as:  PRINZIDE,ZESTORETIC  Take 2 tablets by mouth daily. TAKE 2 TABLETs BY MOUTH EVERY EVENING     metFORMIN 1000 MG tablet  Commonly known as:  GLUCOPHAGE  Take 1,000 mg by mouth 2 (two) times daily with a meal.     multivitamin with minerals Tabs tablet  Take 1 tablet by mouth daily.         Disposition:   Future Appointments Provider Department Dept Phone   01/14/2014 12:30 PM [facility] [phone]   04/10/2014 9:45 AM [name], MD [facility] [phone]   05/16/2014 3:00 PM [facility] [phone]   05/16/2014 3:30 PM Ladell Pier, MD [facility] [phone]     Follow-up Information   Follow up with [facility] On 01/14/2014. (At 12:30 PM for wound check)    Specialty:  Cardiology   Contact information:   [phone]  [address] [place] [address] [phone]      Follow up with [name], MD On 04/10/2014. (At 9:45 AM)    Specialty:  Cardiology   Contact information:   [address] [place] [address] [phone]       Duration of Discharge Encounter: Greater than 30 minutes including physician time.  Signed,  [name] MD

## 2014-01-04 NOTE — Progress Notes (Signed)
DC IV and tele per protocol; DC instructions reviewed and signed by patient; pt does not have any further questions.  Carollee Sires, RN

## 2014-01-07 ENCOUNTER — Telehealth: Payer: Self-pay | Admitting: *Deleted

## 2014-01-07 ENCOUNTER — Encounter: Payer: Self-pay | Admitting: Internal Medicine

## 2014-01-07 NOTE — Telephone Encounter (Signed)
Patient sent an Email asking about having a CT scan vs an MRI. Advised that it is not a problem to have a CT scan with her pacer but if she needs an MRI cardiology needs to be involved because of potential problems. She states that she has a history of cancer and Dr.Sherrill needs follow up scans. Advised if she MUST have an MRI to have [facility] office contact us.

## 2014-01-14 ENCOUNTER — Ambulatory Visit (INDEPENDENT_AMBULATORY_CARE_PROVIDER_SITE_OTHER): Payer: Medicare Other | Admitting: *Deleted

## 2014-01-14 DIAGNOSIS — I4891 Unspecified atrial fibrillation: Secondary | ICD-10-CM

## 2014-01-14 DIAGNOSIS — I441 Atrioventricular block, second degree: Secondary | ICD-10-CM | POA: Diagnosis not present

## 2014-01-14 LAB — MDC_IDC_ENUM_SESS_TYPE_INCLINIC
Battery Remaining Longevity: 116.4 mo
Battery Voltage: 3.05 V
Implantable Pulse Generator Model: 2240
Lead Channel Impedance Value: 487.5 Ohm
Lead Channel Pacing Threshold Amplitude: 0.625 V
Lead Channel Pacing Threshold Amplitude: 1 V
Lead Channel Pacing Threshold Pulse Width: 0.4 ms
Lead Channel Sensing Intrinsic Amplitude: 11.1 mV
Lead Channel Sensing Intrinsic Amplitude: 2.2 mV
Lead Channel Setting Pacing Amplitude: 0.875
Lead Channel Setting Pacing Amplitude: 3.5 V
Lead Channel Setting Pacing Pulse Width: 0.4 ms
Lead Channel Setting Sensing Sensitivity: 2 mV
MDC IDC MSMT LEADCHNL RA PACING THRESHOLD AMPLITUDE: 1 V
MDC IDC MSMT LEADCHNL RA PACING THRESHOLD PULSEWIDTH: 0.4 ms
MDC IDC MSMT LEADCHNL RA PACING THRESHOLD PULSEWIDTH: 0.4 ms
MDC IDC MSMT LEADCHNL RV IMPEDANCE VALUE: 487.5 Ohm
MDC IDC PG SERIAL: 3013730
MDC IDC SESS DTM: 20150518130617
MDC IDC STAT BRADY RA PERCENT PACED: 26 %
MDC IDC STAT BRADY RV PERCENT PACED: 99.95 %

## 2014-01-14 NOTE — Progress Notes (Signed)
Wound check appointment. Steri-strips removed. Wound without redness or edema---mild reaction to tape adhesive--documented in allergies. Incision edges approximated, wound well healed. Normal device function. Thresholds, sensing, and impedances consistent with implant measurements. Device programmed at 3.5V/auto capture programmed on for extra safety margin until 3 month visit. Histogram distribution appropriate for patient and level of activity. 4 mode switches---longest 4sec, fastest + eliquis. No high ventricular rates noted. Patient educated about wound care, arm mobility, lifting restrictions. ROV w/ Dr. Rayann Heman 04/10/14.

## 2014-01-17 ENCOUNTER — Encounter: Payer: Self-pay | Admitting: Internal Medicine

## 2014-01-25 ENCOUNTER — Encounter: Payer: Self-pay | Admitting: Internal Medicine

## 2014-02-26 DIAGNOSIS — E1149 Type 2 diabetes mellitus with other diabetic neurological complication: Secondary | ICD-10-CM | POA: Diagnosis not present

## 2014-02-26 DIAGNOSIS — E039 Hypothyroidism, unspecified: Secondary | ICD-10-CM | POA: Diagnosis not present

## 2014-03-23 ENCOUNTER — Other Ambulatory Visit: Payer: Self-pay | Admitting: Interventional Cardiology

## 2014-03-26 DIAGNOSIS — E039 Hypothyroidism, unspecified: Secondary | ICD-10-CM | POA: Diagnosis not present

## 2014-03-26 DIAGNOSIS — E042 Nontoxic multinodular goiter: Secondary | ICD-10-CM | POA: Diagnosis not present

## 2014-03-26 DIAGNOSIS — E1149 Type 2 diabetes mellitus with other diabetic neurological complication: Secondary | ICD-10-CM | POA: Diagnosis not present

## 2014-03-26 DIAGNOSIS — E1142 Type 2 diabetes mellitus with diabetic polyneuropathy: Secondary | ICD-10-CM | POA: Diagnosis not present

## 2014-03-26 DIAGNOSIS — E785 Hyperlipidemia, unspecified: Secondary | ICD-10-CM | POA: Diagnosis not present

## 2014-03-28 ENCOUNTER — Other Ambulatory Visit: Payer: Self-pay | Admitting: *Deleted

## 2014-03-28 DIAGNOSIS — C189 Malignant neoplasm of colon, unspecified: Secondary | ICD-10-CM

## 2014-03-28 DIAGNOSIS — D649 Anemia, unspecified: Secondary | ICD-10-CM

## 2014-03-28 MED ORDER — BUPROPION HCL ER (SR) 150 MG PO TB12: 150.0000 mg | ORAL_TABLET | Freq: Every morning | ORAL | Status: DC

## 2014-03-28 NOTE — Telephone Encounter (Signed)
Pt called requesting re-fill of Wellbutrin "I moved here permanently and the [place] MD will not prescribe this anymore; would Dr. Benay Spice re-fill for me?"  Per Dr. Benay Spice; notified pt that MD will re-fill Wellbutrin for 30 days ONLY.  Pt instructed to contact PCP for future re-fills.  Pt verbalized understanding of information.

## 2014-04-10 ENCOUNTER — Encounter: Payer: Self-pay | Admitting: Internal Medicine

## 2014-04-10 ENCOUNTER — Ambulatory Visit (INDEPENDENT_AMBULATORY_CARE_PROVIDER_SITE_OTHER): Payer: Medicare Other | Admitting: Internal Medicine

## 2014-04-10 VITALS — BP 179/84 | HR 78 | Ht 67.0 in | Wt 251.0 lb

## 2014-04-10 DIAGNOSIS — I480 Paroxysmal atrial fibrillation: Secondary | ICD-10-CM

## 2014-04-10 DIAGNOSIS — I2510 Atherosclerotic heart disease of native coronary artery without angina pectoris: Secondary | ICD-10-CM

## 2014-04-10 DIAGNOSIS — I441 Atrioventricular block, second degree: Secondary | ICD-10-CM

## 2014-04-10 DIAGNOSIS — I4891 Unspecified atrial fibrillation: Secondary | ICD-10-CM | POA: Diagnosis not present

## 2014-04-10 DIAGNOSIS — I442 Atrioventricular block, complete: Secondary | ICD-10-CM

## 2014-04-10 LAB — MDC_IDC_ENUM_SESS_TYPE_INCLINIC
Battery Voltage: 3.02 V
Brady Statistic RA Percent Paced: 11 %
Implantable Pulse Generator Model: 2240
Implantable Pulse Generator Serial Number: 3013730
Lead Channel Impedance Value: 437.5 Ohm
Lead Channel Pacing Threshold Amplitude: 0.625 V
Lead Channel Pacing Threshold Amplitude: 1.25 V
Lead Channel Pacing Threshold Amplitude: 1.25 V
Lead Channel Pacing Threshold Pulse Width: 0.4 ms
Lead Channel Pacing Threshold Pulse Width: 0.6 ms
Lead Channel Sensing Intrinsic Amplitude: 1.7 mV
Lead Channel Setting Pacing Amplitude: 2.5 V
Lead Channel Setting Pacing Pulse Width: 0.4 ms
Lead Channel Setting Sensing Sensitivity: 2 mV
MDC IDC MSMT BATTERY REMAINING LONGEVITY: 124.8 mo
MDC IDC MSMT LEADCHNL RA IMPEDANCE VALUE: 450 Ohm
MDC IDC MSMT LEADCHNL RA PACING THRESHOLD PULSEWIDTH: 0.6 ms
MDC IDC MSMT LEADCHNL RV SENSING INTR AMPL: 10.4 mV
MDC IDC SESS DTM: 20150812140905
MDC IDC SET LEADCHNL RV PACING AMPLITUDE: 0.875
MDC IDC STAT BRADY RV PERCENT PACED: 99.89 %

## 2014-04-10 NOTE — Patient Instructions (Addendum)
Remote monitoring is used to monitor your pacemaker from home. This monitoring reduces the number of office visits required to check your device to one time per year. It allows [place] to keep an eye on the functioning of your device to ensure it is working properly. You are scheduled for a device check from home on 07-15-2014. You may send your transmission at any time that day. If you have a wireless device, the transmission will be sent automatically. After your physician reviews your transmission, you will receive a postcard with your next transmission date.  Your physician recommends that you schedule a follow-up appointment in: May 2016 with Dr.Allred   Your physician recommends that you continue on your current medications as directed. Please refer to the Current Medication list given to you today.

## 2014-04-10 NOTE — Progress Notes (Signed)
PCP: Cloyd Stagers, MD Primary Cardiologist:  Dr Loni Beckwith Mary [name] is a 73 y.o. female who presents today for routine electrophysiology followup.  Since her ppm implant, the patient reports doing very well. Her energy and SOB are much improved.  Today, she denies symptoms of palpitations, chest pain, dizziness, presyncope, or syncope.  Her edema is stable.  The patient is otherwise without complaint today.   Past Medical History  Diagnosis Date  . Hypertension   . CAD (coronary artery disease)     non obstructive  . Hyperlipidemia   . Hypothyroidism   . DVT (deep venous thrombosis) 2013    "twice behind knee on left side" (06/25/2013)  . Foot fracture 2013    "left; fell; never had OR; just wrapped good and wore brace" (06/25/2013)  . Second degree Mobitz I AV block   . Arrhythmia     "low heart rate; 20's-30's" (06/25/2013)  . Sleep apnea     "cleared up after I lost 134# w/gastric bypass" (06/25/2013)  . Iron deficiency anemia     "had 3 iron infusions in 2013" (06/25/2013)  . Headache(784.0)     "very frequent recently; don't usually have headaches at all" (06/25/2013)  . Arthritis     "very bad; knees and hands" (06/25/2013)  . Chronic lower back pain   . Colon cancer 2012    "started chemo 12/23/2010" (06/25/2013)  . Multinodular goiter   . Colon polyps   . CHF (congestive heart failure)   . Glaucoma   . Type II diabetes mellitus   . Diabetic peripheral neuropathy   . Falls infrequently   . Polyneuropathy in diabetes(357.2)    Past Surgical History  Procedure Laterality Date  . Roux-en-y gastric bypass  2009  . Cardiac catheterization  2006    Never had PCI, 3 caths total. Last one in [place]  . Cholecystectomy  1980's  . Umbilical hernia repair  1970's?     (06/25/2013)  . Ulnar tunnel release Left ~ 2008  . Cataract extraction, bilateral Bilateral 2013    "and put stent in my left eye for glaucoma" (06/25/2013)  . Eye muscle surgery Bilateral  ~ 1963    "muscles too long; eyes would go out and up; tied muscles to hold my eyes straight" (06/25/2013)  . Portacath placement Left 11/2010  . Colectomy  10/2010    Tumor removal  . Pacemaker insertion  01/02/2014    STJ Assurity dual chamber pacemaker implnated by Dr Lovena Le for SSS  . Lead revision  01-03-2014    atrial lead revision by Dr Rayann Heman    ROS- all systems are reviewed and negative except as per HPI above  Current Outpatient Prescriptions  Medication Sig Dispense Refill  . apixaban (ELIQUIS) 5 MG TABS tablet Take 5 mg by mouth 2 (two) times daily.      Marland Kitchen atorvastatin (LIPITOR) 80 MG tablet Take 80 mg by mouth daily.      . bimatoprost (LUMIGAN) 0.01 % SOLN Place 1 drop into the right eye at bedtime. Right eye only      . buPROPion (WELLBUTRIN SR) 150 MG 12 hr tablet Take 1 tablet (150 mg total) by mouth every morning.  30 tablet  0  . Calcium Citrate-Vitamin D (CITRACAL + D PO) Take 1 tablet by mouth 2 (two) times daily.      . Choline Fenofibrate 135 MG capsule Take 135 mg by mouth at bedtime.      . cyanocobalamin (,  VITAMIN B-12,) 1000 MCG/ML injection Inject 1,000 mcg into the skin every 30 (thirty) days.      . ferrous sulfate 325 (65 FE) MG tablet Take 325 mg by mouth 2 (two) times daily.      Marland Kitchen gabapentin (NEURONTIN) 300 MG capsule Take 300 mg by mouth 3 (three) times daily.      Marland Kitchen HYDROcodone-acetaminophen (VICODIN) 5-500 MG per tablet Take 1 tablet by mouth daily as needed for pain.       Marland Kitchen insulin glargine (LANTUS) 100 UNIT/ML injection Inject 20 Units into the skin at bedtime.      Marland Kitchen levothyroxine (SYNTHROID, LEVOTHROID) 150 MCG tablet Take 150 mcg by mouth daily before breakfast.      . lisinopril-hydrochlorothiazide (PRINZIDE,ZESTORETIC) 20-12.5 MG per tablet TAKE 2 TABLETs BY MOUTH EVERY EVENING      . metFORMIN (GLUCOPHAGE) 1000 MG tablet Take 1,000 mg by mouth 2 (two) times daily with a meal.       . Multiple Vitamin (MULTIVITAMIN WITH MINERALS) TABS tablet Take  1 tablet by mouth daily.       No current facility-administered medications for this visit.   ROS- all systems are reviewed and negative except as per HPI above  Physical Exam: Filed Vitals:   04/10/14 1006  BP: 179/84  Pulse: 78  Height: 5\' 7"  (1.702 m)  Weight: 251 lb (113.853 kg)    GEN- The patient is well appearing, alert and oriented x 3 today.   Head- normocephalic, atraumatic Eyes-  Sclera clear, conjunctiva pink Ears- hearing intact Oropharynx- clear Lungs- Clear to ausculation bilaterally, normal work of breathing Chest- pacemaker pocket is well healed,  There is a notable divit over the device but no signs of infection otherwise Heart- Regular rate and rhythm, no murmurs, rubs or gallops, PMI not laterally displaced GI- soft, NT, ND, + BS Extremities- no clubbing, cyanosis, + edema with support hose in place  Pacemaker interrogation- reviewed in detail today,  See PACEART report ekg reveals sinus with V pacing  Assessment and Plan:  1. Second degree AV block Normal pacemaker function See Pace Art report No changes today  2. HTN Repeat by MD is 144/60 She reports BP is mostly well controlled at home  3. Obesity Weight loss is advised Body mass index is 39.3 kg/(m^2).  4. afib Continue eliquis Hold off on AAD therapy unless her afib burden increases (currently < 1%)  Follow-up with Dr [name] as scheduled Merlin Return to see me in 1 year

## 2014-04-11 ENCOUNTER — Encounter: Payer: Medicare Other | Admitting: Internal Medicine

## 2014-04-17 ENCOUNTER — Encounter: Payer: Self-pay | Admitting: Internal Medicine

## 2014-04-24 ENCOUNTER — Encounter: Payer: Self-pay | Admitting: Internal Medicine

## 2014-04-30 ENCOUNTER — Encounter: Payer: Self-pay | Admitting: Internal Medicine

## 2014-04-30 NOTE — Telephone Encounter (Signed)
Per Pt wrong MD

## 2014-05-13 ENCOUNTER — Encounter: Payer: Self-pay | Admitting: Interventional Cardiology

## 2014-05-13 ENCOUNTER — Ambulatory Visit (INDEPENDENT_AMBULATORY_CARE_PROVIDER_SITE_OTHER): Payer: Medicare Other | Admitting: Interventional Cardiology

## 2014-05-13 VITALS — BP 154/64 | HR 62 | Ht 67.0 in | Wt 255.0 lb

## 2014-05-13 DIAGNOSIS — I4891 Unspecified atrial fibrillation: Secondary | ICD-10-CM

## 2014-05-13 DIAGNOSIS — I480 Paroxysmal atrial fibrillation: Secondary | ICD-10-CM

## 2014-05-13 DIAGNOSIS — Z950 Presence of cardiac pacemaker: Secondary | ICD-10-CM | POA: Insufficient documentation

## 2014-05-13 DIAGNOSIS — I441 Atrioventricular block, second degree: Secondary | ICD-10-CM

## 2014-05-13 DIAGNOSIS — E118 Type 2 diabetes mellitus with unspecified complications: Secondary | ICD-10-CM

## 2014-05-13 DIAGNOSIS — I2510 Atherosclerotic heart disease of native coronary artery without angina pectoris: Secondary | ICD-10-CM

## 2014-05-13 DIAGNOSIS — I10 Essential (primary) hypertension: Secondary | ICD-10-CM

## 2014-05-13 HISTORY — DX: Presence of cardiac pacemaker: Z95.0

## 2014-05-13 NOTE — Patient Instructions (Signed)
Your physician recommends that you continue on your current medications as directed. Please refer to the Current Medication list given to you today.  Your physician wants you to follow-up in: 9 months You will receive a reminder letter in the mail two months in advance. If you don't receive a letter, please call our office to schedule the follow-up appointment.  

## 2014-05-13 NOTE — Progress Notes (Signed)
Patient ID: Mary [name], female   DOB: 1941/08/04, 73 y.o.   MRN: [id number]    [address], [address], [place]  [address] Phone: [phone] Fax:  [phone]  Date:  05/13/2014   ID:  Mary [name], DOB [birth year], MRN [id number]  PCP:  Cloyd Stagers, MD   ASSESSMENT:  1. Pacemaker in situ for high-grade AV block 2. Hypertension 3. Coronary artery disease, asymptomatic with reference to angina 4. Hyperlipidemia 5. Obesity  PLAN:  1. Continue current medical regimen with no changes. 2. Clinical followup in 9 months. 3 pericolic angina or heart failure symptoms.   SUBJECTIVE: Mary [name] is a 73 y.o. female now 4 months post permanent pacemaker for second-degree AV block and chronotropic incompetence. She states her lower sternum the swelling in exertional tolerance have tremendously improved. Her husband complains that he can no longer keep up with her. She has had no dizziness or recurrent near syncope. The pacemaker was performed in May. She did have immediate atrial lead dislodgment and required revision but has had no difficulty since that time.   Wt Readings from Last 3 Encounters:  05/13/14 255 lb (115.667 kg)  04/10/14 251 lb (113.853 kg)  01/02/14 256 lb (116.121 kg)     Past Medical History  Diagnosis Date  . Hypertension   . CAD (coronary artery disease)     non obstructive  . Hyperlipidemia   . Hypothyroidism   . DVT (deep venous thrombosis) 2013    "twice behind knee on left side" (06/25/2013)  . Foot fracture 2013    "left; fell; never had OR; just wrapped good and wore brace" (06/25/2013)  . Second degree Mobitz I AV block   . Arrhythmia     "low heart rate; 20's-30's" (06/25/2013)  . Sleep apnea     "cleared up after I lost 134# w/gastric bypass" (06/25/2013)  . Iron deficiency anemia     "had 3 iron infusions in 2013" (06/25/2013)  . Headache(784.0)     "very frequent recently; don't usually have headaches at all"  (06/25/2013)  . Arthritis     "very bad; knees and hands" (06/25/2013)  . Chronic lower back pain   . Colon cancer 2012    "started chemo 12/23/2010" (06/25/2013)  . Multinodular goiter   . Colon polyps   . CHF (congestive heart failure)   . Glaucoma   . Type II diabetes mellitus   . Diabetic peripheral neuropathy   . Falls infrequently   . Polyneuropathy in diabetes(357.2)     Current Outpatient Prescriptions  Medication Sig Dispense Refill  . apixaban (ELIQUIS) 5 MG TABS tablet Take 5 mg by mouth 2 (two) times daily.      Marland Kitchen atorvastatin (LIPITOR) 80 MG tablet Take 80 mg by mouth daily.      . bimatoprost (LUMIGAN) 0.01 % SOLN Place 1 drop into the right eye at bedtime. Right eye only      . buPROPion (WELLBUTRIN SR) 150 MG 12 hr tablet Take 1 tablet (150 mg total) by mouth every morning.  30 tablet  0  . Calcium Citrate-Vitamin D (CITRACAL + D PO) Take 1 tablet by mouth 2 (two) times daily.      . Choline Fenofibrate 135 MG capsule Take 135 mg by mouth at bedtime.      . cyanocobalamin (,VITAMIN B-12,) 1000 MCG/ML injection Inject 1,000 mcg into the skin every 30 (thirty) days.      . ferrous sulfate  325 (65 FE) MG tablet Take 325 mg by mouth 2 (two) times daily.      Marland Kitchen gabapentin (NEURONTIN) 300 MG capsule Take 300 mg by mouth 3 (three) times daily.      Marland Kitchen HYDROcodone-acetaminophen (VICODIN) 5-500 MG per tablet Take 1 tablet by mouth daily as needed for pain.       Marland Kitchen insulin glargine (LANTUS) 100 UNIT/ML injection Inject 20 Units into the skin at bedtime.      Marland Kitchen levothyroxine (SYNTHROID, LEVOTHROID) 150 MCG tablet Take 150 mcg by mouth daily before breakfast.      . lisinopril-hydrochlorothiazide (PRINZIDE,ZESTORETIC) 20-12.5 MG per tablet TAKE 2 TABLETs BY MOUTH EVERY EVENING      . metFORMIN (GLUCOPHAGE) 1000 MG tablet Take 1,000 mg by mouth 2 (two) times daily with a meal.       . Multiple Vitamin (MULTIVITAMIN WITH MINERALS) TABS tablet Take 1 tablet by mouth daily.       No  current facility-administered medications for this visit.    Allergies:    Allergies  Allergen Reactions  . Other Other (See Comments)    NO Blind Scopes with Naso Gastric tube.  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 Adhesive [Tape]   . Aspirin     S/P gastric bypass surgery, states her MD told her to not take Aspirin.  . Latex     Tape= burns  . Nsaids     S/P gastric bypass-told not to take. GI bleeds with NSAIDS    Social History:  The patient  reports that she quit smoking about a year ago. Her smoking use included Cigarettes. She has a 30 pack-year smoking history. She has never used smokeless tobacco. She reports that she drinks alcohol. She reports that she does not use illicit drugs.   ROS:  Please see the history of present illness.      All other systems reviewed and negative.   OBJECTIVE: VS:  BP 154/64  Pulse 62  Ht 5\' 7"  (1.702 m)  Wt 255 lb (115.667 kg)  BMI 39.93 kg/m2 Well nourished, well developed, in no acute distress, obese HEENT: normal Neck: JVD flat. Carotid bruit absent  Cardiac:  normal S1, S2; RRR; no murmur Lungs:  clear to auscultation bilaterally, no wheezing, rhonchi or rales Abd: soft, nontender, no hepatomegaly Ext: Edema absent. Pulses 2+ Skin: warm and dry Neuro:  CNs 2-12 intact, no focal abnormalities noted  EKG:  Not performed       Signed, Illene Labrador III, MD 05/13/2014 12:47 PM

## 2014-05-16 ENCOUNTER — Other Ambulatory Visit (HOSPITAL_BASED_OUTPATIENT_CLINIC_OR_DEPARTMENT_OTHER): Payer: Medicare Other

## 2014-05-16 ENCOUNTER — Ambulatory Visit (HOSPITAL_BASED_OUTPATIENT_CLINIC_OR_DEPARTMENT_OTHER): Payer: Medicare Other | Admitting: Oncology

## 2014-05-16 ENCOUNTER — Other Ambulatory Visit: Payer: Self-pay | Admitting: *Deleted

## 2014-05-16 VITALS — BP 156/71 | HR 75 | Temp 98.5°F | Resp 18 | Ht 67.0 in | Wt 254.4 lb

## 2014-05-16 DIAGNOSIS — D649 Anemia, unspecified: Secondary | ICD-10-CM

## 2014-05-16 DIAGNOSIS — C189 Malignant neoplasm of colon, unspecified: Secondary | ICD-10-CM

## 2014-05-16 DIAGNOSIS — C184 Malignant neoplasm of transverse colon: Secondary | ICD-10-CM

## 2014-05-16 DIAGNOSIS — E119 Type 2 diabetes mellitus without complications: Secondary | ICD-10-CM

## 2014-05-16 DIAGNOSIS — E039 Hypothyroidism, unspecified: Secondary | ICD-10-CM

## 2014-05-16 DIAGNOSIS — G620 Drug-induced polyneuropathy: Secondary | ICD-10-CM

## 2014-05-16 LAB — CBC WITH DIFFERENTIAL/PLATELET
BASO%: 1.2 % (ref 0.0–2.0)
BASOS ABS: 0.1 10*3/uL (ref 0.0–0.1)
EOS%: 2.2 % (ref 0.0–7.0)
Eosinophils Absolute: 0.2 10*3/uL (ref 0.0–0.5)
HEMATOCRIT: 37.1 % (ref 34.8–46.6)
HGB: 11.9 g/dL (ref 11.6–15.9)
LYMPH%: 30.4 % (ref 14.0–49.7)
MCH: 29.1 pg (ref 25.1–34.0)
MCHC: 32 g/dL (ref 31.5–36.0)
MCV: 90.9 fL (ref 79.5–101.0)
MONO#: 0.8 10*3/uL (ref 0.1–0.9)
MONO%: 7.9 % (ref 0.0–14.0)
NEUT%: 58.3 % (ref 38.4–76.8)
NEUTROS ABS: 5.8 10*3/uL (ref 1.5–6.5)
PLATELETS: 342 10*3/uL (ref 145–400)
RBC: 4.08 10*6/uL (ref 3.70–5.45)
RDW: 13.8 % (ref 11.2–14.5)
WBC: 10 10*3/uL (ref 3.9–10.3)
lymph#: 3 10*3/uL (ref 0.9–3.3)

## 2014-05-16 LAB — BASIC METABOLIC PANEL (CC13)
ANION GAP: 9 meq/L (ref 3–11)
BUN: 23.9 mg/dL (ref 7.0–26.0)
CO2: 27 mEq/L (ref 22–29)
Calcium: 9.6 mg/dL (ref 8.4–10.4)
Chloride: 105 mEq/L (ref 98–109)
Creatinine: 1.2 mg/dL — ABNORMAL HIGH (ref 0.6–1.1)
GLUCOSE: 142 mg/dL — AB (ref 70–140)
Potassium: 4.8 mEq/L (ref 3.5–5.1)
Sodium: 141 mEq/L (ref 136–145)

## 2014-05-16 MED ORDER — BUPROPION HCL ER (SR) 150 MG PO TB12: 150.0000 mg | ORAL_TABLET | Freq: Every morning | ORAL | Status: DC

## 2014-05-16 NOTE — Progress Notes (Deleted)
  [facility] OFFICE PROGRESS NOTE   Diagnosis:   INTERVAL HISTORY:   ***  Objective:  Vital signs in last 24 hours:  Blood pressure 156/71, pulse 75, temperature 98.5 F (36.9 C), temperature source Oral, resp. rate 18, height 5\' 7"  (1.702 m), weight 254 lb 6.4 oz (115.395 kg).    HEENT: *** Lymphatics: *** Resp: *** Cardio: *** GI: *** Vascular: *** Neuro:***  Skin:***   Portacath/PICC-without erythema  Lab Results:  Lab Results  Component Value Date   WBC 10.0 05/16/2014   HGB 11.9 05/16/2014   HCT 37.1 05/16/2014   MCV 90.9 05/16/2014   PLT 342 05/16/2014   NEUTROABS 5.8 05/16/2014    Lab Results  Component Value Date   NA 139 12/27/2013    Lab Results  Component Value Date   CEA 1.5 11/12/2013    Imaging:  No results found.  Medications: I have reviewed the patient's current medications.  Assessment/Plan: 1.Stage III (pT3,pN1c,M0) adenocarcinoma transverse colon, status post a laparoscopic left colectomy 11/30/2010  -12 cycles of FOLFOX chemotherapy initiated on 12/23/2010, oxaliplatin was held after? Cycle 7 of chemotherapy secondary to neuropathy  -Surveillance colonoscopy March 2013, removal of a rectal tubular adenoma  -Negative surveillance CT scans 12/12/2012  2. History of iron deficiency anemia secondary gastric bypass surgery, status post Venofer, last given 07/03/2012  3. Gastric bypass surgery in 2009  4. Diabetes  5. Hypertension  6. 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 Extensive family history of cancer  9. Paroxysmal atrial fibrillation and Mobitz 1 second degree AV block  10. Hypothyroidism     ***  Disposition:  [name], MD  05/16/2014  4:26 PM

## 2014-05-16 NOTE — Progress Notes (Signed)
  [facility] OFFICE PROGRESS NOTE   Diagnosis: Colon cancer  INTERVAL HISTORY:   She returns as scheduled. She feels well since undergoing pacemaker placement in May. Occasional constipation. No bleeding.  Objective:  Vital signs in last 24 hours:  Blood pressure 156/71, pulse 75, temperature 98.5 F (36.9 C), temperature source Oral, resp. rate 18, height 5\' 7"  (1.702 m), weight 254 lb 6.4 oz (115.395 kg).    HEENT: Neck without mass Lymphatics: No cervical, supra-clavicular, axillary, or inguinal nodes Resp: Lungs clear bilaterally Cardio: Regular rate and rhythm GI: No hepatomegaly, nontender, no mass Vascular: Trace edema at the low leg and ankle bilaterally  Lab Results:  Lab Results  Component Value Date   WBC 10.0 05/16/2014   HGB 11.9 05/16/2014   HCT 37.1 05/16/2014   MCV 90.9 05/16/2014   PLT 342 05/16/2014   NEUTROABS 5.8 05/16/2014    Lab Results  Component Value Date   CEA 1.5 11/12/2013    Imaging:  CTs of the chest, abdomen, and pelvis on 12/20/2013- 1.3 cm hypervascular lesion in the dome of the right liver. No hypovascular liver masses. No other evidence of metastatic disease.  Medications: I have reviewed the patient's current medications.  Assessment/Plan: 1.Stage III (pT3,pN1c,M0) adenocarcinoma transverse colon, status post a laparoscopic left colectomy 11/30/2010  -12 cycles of FOLFOX chemotherapy initiated on 12/23/2010, oxaliplatin was held after? Cycle 7 of chemotherapy secondary to neuropathy  -Surveillance colonoscopy March 2013, removal of a rectal tubular adenoma  -Negative surveillance CT scans 12/12/2012  -Surveillance CTs 12/20/2013 negative a side from an indeterminate hypervascular right liver lesion 2. History of iron deficiency anemia secondary gastric bypass surgery, status post Venofer, last given 07/03/2012  3. Gastric bypass surgery in 2009  4. Diabetes  5. Hypertension  6. Peripheral neuropathy secondary to  diabetes and oxaliplatin  7. 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 Extensive family history of cancer  9. Paroxysmal atrial fibrillation and Mobitz 1 second degree AV block , status post pacemaker placement May 2015 10. Hypothyroidism    Disposition:  Mary [name] remains in clinical remission from colon cancer. We will followup on the CEA from today. She will be scheduled for a CT of the abdomen to followup on the hypervascular liver lesion. She will return for an office visit in 6 months.   [name], MD  05/16/2014  4:22 PM

## 2014-05-17 ENCOUNTER — Telehealth: Payer: Self-pay | Admitting: Oncology

## 2014-05-17 LAB — CEA: CEA: 0.9 ng/mL (ref 0.0–5.0)

## 2014-05-17 NOTE — Telephone Encounter (Signed)
s.w. pt and advisedon March 2016 appt....pt ok and aware °

## 2014-05-20 ENCOUNTER — Other Ambulatory Visit: Payer: Self-pay | Admitting: *Deleted

## 2014-05-20 ENCOUNTER — Encounter: Payer: Self-pay | Admitting: Oncology

## 2014-05-20 ENCOUNTER — Telehealth: Payer: Self-pay | Admitting: *Deleted

## 2014-05-20 NOTE — Telephone Encounter (Signed)
Confirmed with pt that she received call along with instructions for her upcoming CT scan 05/28/14.

## 2014-05-28 ENCOUNTER — Ambulatory Visit (HOSPITAL_COMMUNITY): Payer: Medicare Other

## 2014-06-11 ENCOUNTER — Ambulatory Visit (HOSPITAL_COMMUNITY)
Admission: RE | Admit: 2014-06-11 | Discharge: 2014-06-11 | Disposition: A | Discharge status: Home / Self Care | Payer: Medicare Other | Source: Ambulatory Visit | Attending: Oncology | Admitting: Oncology

## 2014-06-11 DIAGNOSIS — D259 Leiomyoma of uterus, unspecified: Secondary | ICD-10-CM | POA: Diagnosis not present

## 2014-06-11 DIAGNOSIS — C787 Secondary malignant neoplasm of liver and intrahepatic bile duct: Secondary | ICD-10-CM | POA: Diagnosis not present

## 2014-06-11 DIAGNOSIS — Z9221 Personal history of antineoplastic chemotherapy: Secondary | ICD-10-CM | POA: Diagnosis not present

## 2014-06-11 DIAGNOSIS — C189 Malignant neoplasm of colon, unspecified: Secondary | ICD-10-CM

## 2014-06-11 DIAGNOSIS — K769 Liver disease, unspecified: Secondary | ICD-10-CM | POA: Insufficient documentation

## 2014-06-11 MED ORDER — IOHEXOL 300 MG/ML  SOLN
100.0000 mL | Freq: Once | INTRAMUSCULAR | Status: AC | PRN
  Administered 2014-06-11: 100 mL via INTRAVENOUS

## 2014-06-12 DIAGNOSIS — M7701 Medial epicondylitis, right elbow: Secondary | ICD-10-CM | POA: Diagnosis not present

## 2014-06-12 DIAGNOSIS — G5622 Lesion of ulnar nerve, left upper limb: Secondary | ICD-10-CM | POA: Diagnosis not present

## 2014-06-14 ENCOUNTER — Telehealth: Payer: Self-pay | Admitting: *Deleted

## 2014-06-14 NOTE — Telephone Encounter (Signed)
Message copied by Domenic Schwab on Fri Jun 14, 2014 12:48 PM ------      Message from: [name] B      Created: Thu Jun 13, 2014  8:11 AM       Please call patient, liver lesion no longer seen, ct negative for cancer ------

## 2014-06-14 NOTE — Telephone Encounter (Signed)
Per Dr. Benay Spice; notified pt that liver lesion no longer seen on scan, scan negative for cancer. Pt states "I'm so relieved"  Pt verbalized understanding and expressed appreciation for call.

## 2014-06-23 ENCOUNTER — Encounter: Payer: Self-pay | Admitting: Oncology

## 2014-07-09 ENCOUNTER — Other Ambulatory Visit: Payer: Self-pay | Admitting: Interventional Cardiology

## 2014-07-15 ENCOUNTER — Ambulatory Visit (INDEPENDENT_AMBULATORY_CARE_PROVIDER_SITE_OTHER): Payer: Medicare Other | Admitting: *Deleted

## 2014-07-15 DIAGNOSIS — I441 Atrioventricular block, second degree: Secondary | ICD-10-CM

## 2014-07-15 LAB — MDC_IDC_ENUM_SESS_TYPE_REMOTE
Battery Remaining Percentage: 95.5 %
Battery Voltage: 3.02 V
Brady Statistic AP VP Percent: 15 %
Brady Statistic AS VS Percent: 1 %
Brady Statistic RA Percent Paced: 15 %
Date Time Interrogation Session: 20151116070015
Implantable Pulse Generator Model: 2240
Implantable Pulse Generator Serial Number: 3013730
Lead Channel Impedance Value: 430 Ohm
Lead Channel Impedance Value: 440 Ohm
Lead Channel Pacing Threshold Amplitude: 1.25 V
Lead Channel Pacing Threshold Pulse Width: 0.4 ms
Lead Channel Sensing Intrinsic Amplitude: 1.6 mV
Lead Channel Setting Pacing Pulse Width: 0.4 ms
MDC IDC MSMT BATTERY REMAINING LONGEVITY: 116 mo
MDC IDC MSMT LEADCHNL RA PACING THRESHOLD PULSEWIDTH: 0.6 ms
MDC IDC MSMT LEADCHNL RV PACING THRESHOLD AMPLITUDE: 0.625 V
MDC IDC MSMT LEADCHNL RV SENSING INTR AMPL: 12 mV
MDC IDC SET LEADCHNL RA PACING AMPLITUDE: 2.5 V
MDC IDC SET LEADCHNL RV PACING AMPLITUDE: 0.875
MDC IDC SET LEADCHNL RV SENSING SENSITIVITY: 2 mV
MDC IDC STAT BRADY AP VS PERCENT: 1 %
MDC IDC STAT BRADY AS VP PERCENT: 85 %
MDC IDC STAT BRADY RV PERCENT PACED: 99 %

## 2014-07-15 NOTE — Progress Notes (Signed)
Remote pacemaker transmission.   

## 2014-07-22 ENCOUNTER — Encounter: Payer: Self-pay | Admitting: Cardiology

## 2014-07-24 ENCOUNTER — Encounter: Payer: Self-pay | Admitting: Internal Medicine

## 2014-08-08 ENCOUNTER — Encounter (HOSPITAL_COMMUNITY): Payer: Self-pay | Admitting: Internal Medicine

## 2014-08-27 ENCOUNTER — Encounter: Payer: Self-pay | Admitting: Internal Medicine

## 2014-09-02 ENCOUNTER — Telehealth: Payer: Self-pay | Admitting: Internal Medicine

## 2014-09-02 ENCOUNTER — Other Ambulatory Visit: Payer: Self-pay

## 2014-09-02 DIAGNOSIS — Z1231 Encounter for screening mammogram for malignant neoplasm of breast: Secondary | ICD-10-CM

## 2014-09-02 NOTE — Telephone Encounter (Signed)
New Message  Pt wanted to speak with rn concerning her appt in MAY; please call back anddiscuss.

## 2014-09-02 NOTE — Telephone Encounter (Signed)
Patient needs to make her f/u appt for May with Dr. Rayann Heman, but schedule is not out yet. Explained that I would give to our EP scheduler and have her call her once that schedule is available. She was very thankful for the help with this (she states she was afraid she would forget to call back in a month to make appt).

## 2014-09-02 NOTE — Telephone Encounter (Signed)
error 

## 2014-09-03 ENCOUNTER — Encounter: Payer: Self-pay | Admitting: Oncology

## 2014-09-05 ENCOUNTER — Telehealth: Payer: Self-pay | Admitting: Internal Medicine

## 2014-09-05 NOTE — Telephone Encounter (Signed)
Pt calling to see if she is due for colon. Per last OV note it appears pt was supposed to call and schedule colon after pacemaker placed. This was placed in May. Does the pt need an OV or just direct colon? Please advise.

## 2014-09-05 NOTE — Telephone Encounter (Signed)
If stable, direct colon is fine. See my outlined plan from office visit 07-2013. Thanks

## 2014-09-06 NOTE — Telephone Encounter (Signed)
Pt scheduled for previsit 09/18/14@2 :30pm, scheduled for colonoscopy 10/02/14@2 :30pm. Pt aware of appts. Note sent to cardiologist regarding holding Eliquis for procedure.

## 2014-09-10 ENCOUNTER — Ambulatory Visit
Admission: RE | Admit: 2014-09-10 | Discharge: 2014-09-10 | Disposition: A | Discharge status: Home / Self Care | Payer: Medicare Other | Source: Ambulatory Visit

## 2014-09-10 ENCOUNTER — Telehealth: Payer: Self-pay

## 2014-09-10 DIAGNOSIS — Z1231 Encounter for screening mammogram for malignant neoplasm of breast: Secondary | ICD-10-CM

## 2014-09-10 NOTE — Telephone Encounter (Signed)
Pt has previsit scheduled for 09/18/14, please see below regarding Eliquis instructions.

## 2014-09-10 NOTE — Telephone Encounter (Signed)
-----   Message from Aris Georgia, [facility] sent at 09/10/2014  9:02 AM EST ----- Regarding: RE: Eliquis Pt on Eliquis for Afib.  CHADS score 2 and afib burden <1%.  Has a history of DVT as well.  Based on risk, okay to hold Eliquis x 48 hours and resume as soon as possible.    ----- Message -----    From: [name], MD    Sent: 09/07/2014   4:54 PM      To: Aris Georgia, RPH Subject: RE: [place],  Can your team use the protocol on this?    ----- Message -----    From: Maury Dus, RN    Sent: 09/06/2014  10:11 AM      To: [name], MD Subject: Eliquis                                        Dr. Rayann Heman,  Dr. Henrene Pastor has scheduled Ms. Arp for a colonoscopy 10/02/14. Per her medication list she takes Eliquis. Please advise if pt may hold Eliquis prior to procedure and if so how many days.  Thank you, Rosanne Sack RN

## 2014-09-10 NOTE — Telephone Encounter (Signed)
Pt may hold eliquis for 48 hours, see phone note dated 09/10/14.

## 2014-09-12 ENCOUNTER — Other Ambulatory Visit: Payer: Self-pay

## 2014-09-12 DIAGNOSIS — Z23 Encounter for immunization: Secondary | ICD-10-CM | POA: Diagnosis not present

## 2014-09-12 MED ORDER — ATORVASTATIN CALCIUM 80 MG PO TABS: 80.0000 mg | ORAL_TABLET | Freq: Every day | ORAL | Status: DC

## 2014-09-16 ENCOUNTER — Encounter: Payer: Self-pay | Admitting: Internal Medicine

## 2014-09-18 ENCOUNTER — Other Ambulatory Visit: Payer: Self-pay

## 2014-09-18 ENCOUNTER — Ambulatory Visit (AMBULATORY_SURGERY_CENTER): Payer: Self-pay

## 2014-09-18 VITALS — Ht 67.0 in | Wt 260.0 lb

## 2014-09-18 DIAGNOSIS — Z85038 Personal history of other malignant neoplasm of large intestine: Secondary | ICD-10-CM

## 2014-09-18 MED ORDER — MOVIPREP 100 G PO SOLR: 1.0000 | Freq: Once | ORAL | Status: DC

## 2014-09-18 MED ORDER — ATORVASTATIN CALCIUM 80 MG PO TABS: 80.0000 mg | ORAL_TABLET | Freq: Every day | ORAL | Status: DC

## 2014-09-18 NOTE — Progress Notes (Signed)
No allergies to eggs or soy No past problems with anesthesia No diet/weight loss meds No home oxygen  Has email  Emmi instructions given for colonoscopy 

## 2014-09-24 ENCOUNTER — Other Ambulatory Visit: Payer: Self-pay | Admitting: Internal Medicine

## 2014-10-02 ENCOUNTER — Encounter: Payer: Self-pay | Admitting: Internal Medicine

## 2014-10-02 ENCOUNTER — Ambulatory Visit (AMBULATORY_SURGERY_CENTER): Payer: Medicare Other | Admitting: Internal Medicine

## 2014-10-02 VITALS — BP 113/61 | HR 61 | Temp 97.9°F | Resp 20 | Ht 67.0 in | Wt 260.0 lb

## 2014-10-02 DIAGNOSIS — Z85038 Personal history of other malignant neoplasm of large intestine: Secondary | ICD-10-CM

## 2014-10-02 DIAGNOSIS — Z1211 Encounter for screening for malignant neoplasm of colon: Secondary | ICD-10-CM | POA: Diagnosis not present

## 2014-10-02 DIAGNOSIS — I2510 Atherosclerotic heart disease of native coronary artery without angina pectoris: Secondary | ICD-10-CM | POA: Diagnosis not present

## 2014-10-02 DIAGNOSIS — G4733 Obstructive sleep apnea (adult) (pediatric): Secondary | ICD-10-CM | POA: Diagnosis not present

## 2014-10-02 LAB — GLUCOSE, CAPILLARY
GLUCOSE-CAPILLARY: 140 mg/dL — AB (ref 70–99)
Glucose-Capillary: 127 mg/dL — ABNORMAL HIGH (ref 70–99)

## 2014-10-02 MED ORDER — SODIUM CHLORIDE 0.9 % IV SOLN: 500.0000 mL | INTRAVENOUS | Status: DC

## 2014-10-02 NOTE — Progress Notes (Signed)
A/ox3 pleased with MAC, report to Sheila RN 

## 2014-10-02 NOTE — Patient Instructions (Signed)
YOU HAD AN ENDOSCOPIC PROCEDURE TODAY AT THE [facility]: Refer to the procedure report that was given to you for any specific questions about what was found during the examination.  If the procedure report does not answer your questions, please call your gastroenterologist to clarify.  If you requested that your care partner not be given the details of your procedure findings, then the procedure report has been included in a sealed envelope for you to review at your convenience later.  YOU SHOULD EXPECT: Some feelings of bloating in the abdomen. Passage of more gas than usual.  Walking can help get rid of the air that was put into your GI tract during the procedure and reduce the bloating. If you had a lower endoscopy (such as a colonoscopy or flexible sigmoidoscopy) you may notice spotting of blood in your stool or on the toilet paper. If you underwent a bowel prep for your procedure, then you may not have a normal bowel movement for a few days.  DIET: Your first meal following the procedure should be a light meal and then it is ok to progress to your normal diet.  A half-sandwich or bowl of soup is an example of a good first meal.  Heavy or fried foods are harder to digest and may make you feel nauseous or bloated.  Likewise meals heavy in dairy and vegetables can cause extra gas to form and this can also increase the bloating.  Drink plenty of fluids but you should avoid alcoholic beverages for 24 hours.  ACTIVITY: Your care partner should take you home directly after the procedure.  You should plan to take it easy, moving slowly for the rest of the day.  You can resume normal activity the day after the procedure however you should NOT DRIVE or use heavy machinery for 24 hours (because of the sedation medicines used during the test).    SYMPTOMS TO REPORT IMMEDIATELY: A gastroenterologist can be reached at any hour.  During normal business hours, 8:30 AM to 5:00 PM Monday through Friday,  call [phone].  After hours and on weekends, please call the GI answering service at [phone] who will take a message and have the physician on call contact you.   Following lower endoscopy (colonoscopy or flexible sigmoidoscopy):  Excessive amounts of blood in the stool  Significant tenderness or worsening of abdominal pains  Swelling of the abdomen that is new, acute  Fever of 100F or higher  FOLLOW UP: If any biopsies were taken you will be contacted by phone or by letter within the next 1-3 weeks.  Call your gastroenterologist if you have not heard about the biopsies in 3 weeks.  Our staff will call the home number listed on your records the next business day following your procedure to check on you and address any questions or concerns that you may have at that time regarding the information given to you following your procedure. This is a courtesy call and so if there is no answer at the home number and we have not heard from you through the emergency physician on call, we will assume that you have returned to your regular daily activities without incident.  SIGNATURES/CONFIDENTIALITY: You and/or your care partner have signed paperwork which will be entered into your electronic medical record.  These signatures attest to the fact that that the information above on your After Visit Summary has been reviewed and is understood.  Full responsibility of the confidentiality of this   discharge information lies with you and/or your care-partner.  Resume medications. Information given on diverticulosis and high fiber diet with discharge instructions. 

## 2014-10-02 NOTE — Op Note (Signed)
[place]  [address]. [place], [id number]   COLONOSCOPY PROCEDURE REPORT  PATIENT: Mary, [name]  MR#: [id number] BIRTHDATE: November 06, 1940 , 73  yrs. old GENDER: female ENDOSCOPIST: Eustace Quail, MD REFERRED EF:EOFH Edrick Kins, M.D. PROCEDURE DATE:  10/02/2014 PROCEDURE:   Colonoscopy, surveillance First Screening Colonoscopy - Avg.  risk and is 50 yrs.  old or older - No.  Prior Negative Screening - Now for repeat screening. N/A  History of Adenoma - Now for follow-up colonoscopy & has been > or = to 3 yrs.  N/A  Polyps Removed Today? No.  Recommend repeat exam, <10 yrs? Yes.  Polyps Removed Today? No.  Recommend repeat exam, <10 yrs? Yes.  High risk (family or personal hx). ASA CLASS:   Class III INDICATIONS:high risk personal history of colon cancer. Colonoscopy 1996 negative; 2003 (adenomas) colonoscopies in [place] thereafter (no available records);.   colonoscopy in [place] March 2012 with splenic flexure adenocarcinoma, March 2013 (negative) MEDICATIONS: Monitored anesthesia care and Propofol 200 mg IV  DESCRIPTION OF PROCEDURE:   After the risks benefits and alternatives of the procedure were thoroughly explained, informed consent was obtained.  The digital rectal exam revealed no abnormalities of the rectum.   The [id number]  endoscope was introduced through the anus and advanced to the cecum, which was identified by both the appendix and ileocecal valve. No adverse events experienced.   The quality of the prep was good, using MoviPrep  The instrument was then slowly withdrawn as the colon was fully examined.      COLON FINDINGS: The colonoscope was advanced to the cecal tip. The mucosa was carefully examined. Surgical anastomosis in the left colon was unremarkable.There was mild diverticulosis noted in the left colon.   The examination was otherwise normal.  Retroflexed views revealed internal hemorrhoids. The time to cecum=2  minutes 02 seconds.  Withdrawal time=11 minutes 01 seconds.  The scope was withdrawn and the procedure completed. COMPLICATIONS: There were no immediate complications.  ENDOSCOPIC IMPRESSION: 1.   Mild diverticulosis was noted in the left colon 2.   The examination was otherwise normal status post segmental colectomy  RECOMMENDATIONS: 1. Repeat Colonoscopy in 3 years 2. Resume Eliquis today.  eSigned:  Eustace Quail, MD 10/02/2014 2:50 PM   cc: Kavin Leech, MD, Vertell Novak, M.D., and The Patient

## 2014-10-03 ENCOUNTER — Telehealth: Payer: Self-pay | Admitting: *Deleted

## 2014-10-03 NOTE — Telephone Encounter (Signed)
Name identifier, left message, follow-up 

## 2014-10-11 ENCOUNTER — Ambulatory Visit: Payer: Medicare Other | Admitting: Internal Medicine

## 2014-10-14 ENCOUNTER — Ambulatory Visit (INDEPENDENT_AMBULATORY_CARE_PROVIDER_SITE_OTHER): Payer: Medicare Other | Admitting: *Deleted

## 2014-10-14 DIAGNOSIS — I441 Atrioventricular block, second degree: Secondary | ICD-10-CM

## 2014-10-15 ENCOUNTER — Encounter: Payer: Self-pay | Admitting: Internal Medicine

## 2014-10-15 DIAGNOSIS — I441 Atrioventricular block, second degree: Secondary | ICD-10-CM

## 2014-10-15 LAB — MDC_IDC_ENUM_SESS_TYPE_REMOTE
Battery Remaining Longevity: 113 mo
Battery Remaining Percentage: 95.5 %
Battery Voltage: 3.01 V
Brady Statistic AP VP Percent: 16 %
Brady Statistic AS VP Percent: 84 %
Brady Statistic AS VS Percent: 1 %
Date Time Interrogation Session: 20160216152211
Implantable Pulse Generator Model: 2240
Lead Channel Impedance Value: 400 Ohm
Lead Channel Impedance Value: 440 Ohm
Lead Channel Pacing Threshold Amplitude: 0.625 V
Lead Channel Pacing Threshold Pulse Width: 0.4 ms
Lead Channel Pacing Threshold Pulse Width: 0.6 ms
Lead Channel Sensing Intrinsic Amplitude: 1.7 mV
Lead Channel Setting Pacing Amplitude: 0.875
Lead Channel Setting Sensing Sensitivity: 2 mV
MDC IDC MSMT LEADCHNL RA PACING THRESHOLD AMPLITUDE: 1.25 V
MDC IDC MSMT LEADCHNL RV SENSING INTR AMPL: 11.4 mV
MDC IDC PG SERIAL: 3013730
MDC IDC SET LEADCHNL RA PACING AMPLITUDE: 2.5 V
MDC IDC SET LEADCHNL RV PACING PULSEWIDTH: 0.4 ms
MDC IDC STAT BRADY AP VS PERCENT: 1 %
MDC IDC STAT BRADY RA PERCENT PACED: 16 %
MDC IDC STAT BRADY RV PERCENT PACED: 99 %

## 2014-10-15 NOTE — Progress Notes (Signed)
Remote pacemaker transmission.   

## 2014-10-18 ENCOUNTER — Encounter: Payer: Self-pay | Admitting: Interventional Cardiology

## 2014-10-23 ENCOUNTER — Encounter: Payer: Self-pay | Admitting: Cardiology

## 2014-10-25 ENCOUNTER — Encounter: Payer: Self-pay | Admitting: Internal Medicine

## 2014-11-11 ENCOUNTER — Telehealth: Payer: Self-pay

## 2014-11-11 NOTE — Telephone Encounter (Signed)
Patient confirmed receipt of the flu and pneumonia vaccine at [facility] but does not know which pneumonia vaccine or the date.  Call placed to [facility] at [phone] and per pharmacist, the patient was given: 1/14 Fluzone high dose -2015 1/14 Pneumococcal conjugate (Prevnar)  [facility]'d [facility] from [facility] with confirmation; Will scan to chart.

## 2014-11-13 ENCOUNTER — Telehealth: Payer: Self-pay | Admitting: Internal Medicine

## 2014-11-13 NOTE — Telephone Encounter (Signed)
Colon report mailed to pt.

## 2014-11-14 ENCOUNTER — Other Ambulatory Visit: Payer: Medicare Other

## 2014-11-21 ENCOUNTER — Ambulatory Visit (HOSPITAL_BASED_OUTPATIENT_CLINIC_OR_DEPARTMENT_OTHER): Payer: Medicare Other | Admitting: Oncology

## 2014-11-21 ENCOUNTER — Other Ambulatory Visit: Payer: Medicare Other

## 2014-11-21 ENCOUNTER — Telehealth: Payer: Self-pay | Admitting: Oncology

## 2014-11-21 VITALS — BP 136/44 | HR 63 | Temp 97.9°F | Resp 18 | Ht 67.0 in | Wt 254.0 lb

## 2014-11-21 DIAGNOSIS — G609 Hereditary and idiopathic neuropathy, unspecified: Secondary | ICD-10-CM | POA: Diagnosis not present

## 2014-11-21 DIAGNOSIS — I10 Essential (primary) hypertension: Secondary | ICD-10-CM | POA: Diagnosis not present

## 2014-11-21 DIAGNOSIS — E039 Hypothyroidism, unspecified: Secondary | ICD-10-CM | POA: Diagnosis not present

## 2014-11-21 DIAGNOSIS — C189 Malignant neoplasm of colon, unspecified: Secondary | ICD-10-CM

## 2014-11-21 DIAGNOSIS — Z85038 Personal history of other malignant neoplasm of large intestine: Secondary | ICD-10-CM

## 2014-11-21 LAB — CEA: CEA: 1.6 ng/mL (ref 0.0–5.0)

## 2014-11-21 MED ORDER — HYDROCODONE-ACETAMINOPHEN 5-325 MG PO TABS
1.0000 | ORAL_TABLET | Freq: Four times a day (QID) | ORAL | Status: DC | PRN
Start: 2014-11-21 — End: 2015-01-27

## 2014-11-21 NOTE — Telephone Encounter (Signed)
Lft msg for pt confirming labs/ov and genetics class per 03/24 POF, mailed schedule to pt.... KJ also sent msg to pt on my chart

## 2014-11-21 NOTE — Progress Notes (Signed)
  [facility] OFFICE PROGRESS NOTE   Diagnosis: Colon cancer  INTERVAL HISTORY:   Mary [name] returns as scheduled. She feels well aside from neuropathy symptoms in the feet. The feet are often painful at night and she takes hydrocodone occasionally. The neuropathy symptoms predated chemotherapy, but worsened with the oxaliplatin chemotherapy.  A CT scan 06/11/2014 to follow-up on a hypervascular liver lesion noted on the CT 12/21/2014 was negative. The previously noted hyperenhancing focus was not identified.  Objective:  Vital signs in last 24 hours:  Blood pressure 136/44, pulse 63, temperature 97.9 F (36.6 C), temperature source Oral, resp. rate 18, height 5\' 7"  (1.702 m), weight 254 lb (115.214 kg), SpO2 98 %.    HEENT: Neck without mass Lymphatics: No cervical, supra-clavicular, axillary, or inguinal nodes Resp: Lungs with scattered mild wheezes, no respiratory distress Cardio: Regular rate and rhythm GI: No hepatosplenomegaly, nontender, no mass Vascular: Trace edema at the low leg and ankle bilaterally   Lab Results:  Lab Results  Component Value Date   WBC 10.0 05/16/2014   HGB 11.9 05/16/2014   HCT 37.1 05/16/2014   MCV 90.9 05/16/2014   PLT 342 05/16/2014   NEUTROABS 5.8 05/16/2014    Lab Results  Component Value Date   NA 141 05/16/2014    Lab Results  Component Value Date   CEA 0.9 05/16/2014    Imaging:  No results found.  Medications: I have reviewed the patient's current medications.  Assessment/Plan: 1.Stage III (pT3,pN1c,M0) adenocarcinoma transverse colon, status post a laparoscopic left colectomy 11/30/2010  -12 cycles of FOLFOX chemotherapy initiated on 12/23/2010, oxaliplatin was held after? Cycle 7 of chemotherapy secondary to neuropathy  -Surveillance colonoscopy March 2013, removal of a rectal tubular adenoma  -Negative surveillance CT scans 12/12/2012  -Negative abdomen CT 06/11/2014 -Negative surveillance  colonoscopy 10/02/2014 2. History of iron deficiency anemia secondary gastric bypass surgery, status post Venofer, last given 07/03/2012  3. Gastric bypass surgery in 2009  4. Diabetes  5. Hypertension  6. 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 Extensive family history of cancer  9. Paroxysmal atrial fibrillation and Mobitz 1 second degree AV block  10. Hypothyroidism    Disposition:  Mary [name] remains in clinical remission from colon cancer. She will return for an office visit and CEA in 6 months.  I gave her a prescription for hydrocodone to use as needed for the painful peripheral neuropathy. She will discuss the indication for continuing gabapentin with Dr. Buddy Duty. She could try Cymbalta, but she is taking Wellbutrin at present. It is unclear whether the neuropathy is mostly related to diabetes or oxaliplatin chemotherapy.  [name], MD  11/21/2014  11:31 AM

## 2014-12-03 ENCOUNTER — Telehealth: Payer: Self-pay | Admitting: *Deleted

## 2014-12-03 NOTE — Telephone Encounter (Signed)
Left VM for patient to return call to discuss cost of genetics counseling appointment.

## 2014-12-16 DIAGNOSIS — E039 Hypothyroidism, unspecified: Secondary | ICD-10-CM | POA: Diagnosis not present

## 2014-12-16 DIAGNOSIS — E1142 Type 2 diabetes mellitus with diabetic polyneuropathy: Secondary | ICD-10-CM | POA: Diagnosis not present

## 2014-12-16 DIAGNOSIS — E049 Nontoxic goiter, unspecified: Secondary | ICD-10-CM | POA: Diagnosis not present

## 2014-12-26 ENCOUNTER — Encounter: Payer: Self-pay | Admitting: Oncology

## 2014-12-26 ENCOUNTER — Other Ambulatory Visit: Payer: Self-pay | Admitting: *Deleted

## 2014-12-26 MED ORDER — BUPROPION HCL ER (SR) 150 MG PO TB12: 150.0000 mg | ORAL_TABLET | Freq: Every morning | ORAL | Status: DC

## 2014-12-31 ENCOUNTER — Encounter (HOSPITAL_COMMUNITY): Payer: Self-pay | Admitting: *Deleted

## 2014-12-31 ENCOUNTER — Emergency Department (HOSPITAL_COMMUNITY)
Admission: EM | Admit: 2014-12-31 | Discharge: 2014-12-31 | Disposition: A | Discharge status: Home / Self Care | Payer: Medicare Other | Attending: Emergency Medicine | Admitting: Emergency Medicine

## 2014-12-31 ENCOUNTER — Emergency Department (HOSPITAL_COMMUNITY): Payer: Medicare Other

## 2014-12-31 DIAGNOSIS — I509 Heart failure, unspecified: Secondary | ICD-10-CM | POA: Diagnosis not present

## 2014-12-31 DIAGNOSIS — E1142 Type 2 diabetes mellitus with diabetic polyneuropathy: Secondary | ICD-10-CM | POA: Insufficient documentation

## 2014-12-31 DIAGNOSIS — Z8781 Personal history of (healed) traumatic fracture: Secondary | ICD-10-CM | POA: Insufficient documentation

## 2014-12-31 DIAGNOSIS — E039 Hypothyroidism, unspecified: Secondary | ICD-10-CM | POA: Diagnosis not present

## 2014-12-31 DIAGNOSIS — Z79899 Other long term (current) drug therapy: Secondary | ICD-10-CM | POA: Insufficient documentation

## 2014-12-31 DIAGNOSIS — Z85038 Personal history of other malignant neoplasm of large intestine: Secondary | ICD-10-CM | POA: Insufficient documentation

## 2014-12-31 DIAGNOSIS — Z86010 Personal history of colonic polyps: Secondary | ICD-10-CM | POA: Diagnosis not present

## 2014-12-31 DIAGNOSIS — M1990 Unspecified osteoarthritis, unspecified site: Secondary | ICD-10-CM | POA: Insufficient documentation

## 2014-12-31 DIAGNOSIS — Z91040 Latex allergy status: Secondary | ICD-10-CM | POA: Insufficient documentation

## 2014-12-31 DIAGNOSIS — Z86718 Personal history of other venous thrombosis and embolism: Secondary | ICD-10-CM | POA: Insufficient documentation

## 2014-12-31 DIAGNOSIS — G8929 Other chronic pain: Secondary | ICD-10-CM | POA: Insufficient documentation

## 2014-12-31 DIAGNOSIS — Z87891 Personal history of nicotine dependence: Secondary | ICD-10-CM | POA: Diagnosis not present

## 2014-12-31 DIAGNOSIS — H409 Unspecified glaucoma: Secondary | ICD-10-CM | POA: Diagnosis not present

## 2014-12-31 DIAGNOSIS — Z794 Long term (current) use of insulin: Secondary | ICD-10-CM | POA: Diagnosis not present

## 2014-12-31 DIAGNOSIS — E785 Hyperlipidemia, unspecified: Secondary | ICD-10-CM | POA: Insufficient documentation

## 2014-12-31 DIAGNOSIS — I10 Essential (primary) hypertension: Secondary | ICD-10-CM | POA: Diagnosis not present

## 2014-12-31 DIAGNOSIS — I2510 Atherosclerotic heart disease of native coronary artery without angina pectoris: Secondary | ICD-10-CM | POA: Insufficient documentation

## 2014-12-31 DIAGNOSIS — Z862 Personal history of diseases of the blood and blood-forming organs and certain disorders involving the immune mechanism: Secondary | ICD-10-CM | POA: Insufficient documentation

## 2014-12-31 DIAGNOSIS — R079 Chest pain, unspecified: Secondary | ICD-10-CM | POA: Diagnosis not present

## 2014-12-31 DIAGNOSIS — R55 Syncope and collapse: Secondary | ICD-10-CM | POA: Insufficient documentation

## 2014-12-31 DIAGNOSIS — R0789 Other chest pain: Secondary | ICD-10-CM | POA: Diagnosis not present

## 2014-12-31 LAB — BASIC METABOLIC PANEL
ANION GAP: 11 (ref 5–15)
BUN: 16 mg/dL (ref 6–20)
CALCIUM: 9.2 mg/dL (ref 8.9–10.3)
CO2: 25 mmol/L (ref 22–32)
CREATININE: 1.14 mg/dL — AB (ref 0.44–1.00)
Chloride: 100 mmol/L — ABNORMAL LOW (ref 101–111)
GFR calc Af Amer: 54 mL/min — ABNORMAL LOW (ref >60)
GFR calc non Af Amer: 47 mL/min — ABNORMAL LOW (ref >60)
Glucose, Bld: 191 mg/dL — ABNORMAL HIGH (ref 70–99)
Potassium: 4.7 mmol/L (ref 3.5–5.1)
Sodium: 136 mmol/L (ref 135–145)

## 2014-12-31 LAB — I-STAT TROPONIN, ED
TROPONIN I, POC: 0 ng/mL (ref 0.00–0.08)
TROPONIN I, POC: 0.03 ng/mL (ref 0.00–0.08)

## 2014-12-31 LAB — CBC
HCT: 39 % (ref 36.0–46.0)
HEMOGLOBIN: 12.6 g/dL (ref 12.0–15.0)
MCH: 28.8 pg (ref 26.0–34.0)
MCHC: 32.3 g/dL (ref 30.0–36.0)
MCV: 89.2 fL (ref 78.0–100.0)
PLATELETS: 329 10*3/uL (ref 150–400)
RBC: 4.37 MIL/uL (ref 3.87–5.11)
RDW: 13.3 % (ref 11.5–15.5)
WBC: 9.2 10*3/uL (ref 4.0–10.5)

## 2014-12-31 LAB — D-DIMER, QUANTITATIVE (NOT AT ARMC): D-Dimer, Quant: 0.3 ug/mL-FEU (ref 0.00–0.48)

## 2014-12-31 NOTE — ED Notes (Signed)
Jarrett Soho from [facility] [employer] called to report that no episodes were found during the interrogation of pts pacemaker, advised pt had some episodes of A fib on April 27th, but nothing unusual recorded since then. Dr. Ralene Bathe made aware.

## 2014-12-31 NOTE — ED Notes (Signed)
Pt reports left sided chest pain and pressure that started this morning. Pt reports feeling lightheaded and nauseated as well.

## 2014-12-31 NOTE — Discharge Instructions (Signed)
The cause of your chest pain was not identified today.  Please get rechecked immediately if you develop recurrent chest pain or new concerning symptoms.     Chest Pain (Nonspecific) It is often hard to give a specific diagnosis for the cause of chest pain. There is always a chance that your pain could be related to something serious, such as a heart attack or a blood clot in the lungs. You need to follow up with your health care provider for further evaluation. CAUSES   Heartburn.  Pneumonia or bronchitis.  Anxiety or stress.  Inflammation around your heart (pericarditis) or lung (pleuritis or pleurisy).  A blood clot in the lung.  A collapsed lung (pneumothorax). It can develop suddenly on its own (spontaneous pneumothorax) or from trauma to the chest.  Shingles infection (herpes zoster virus). The chest wall is composed of bones, muscles, and cartilage. Any of these can be the source of the pain.  The bones can be bruised by injury.  The muscles or cartilage can be strained by coughing or overwork.  The cartilage can be affected by inflammation and become sore (costochondritis). DIAGNOSIS  Lab tests or other studies may be needed to find the cause of your pain. Your health care provider may have you take a test called an ambulatory electrocardiogram (ECG). An ECG records your heartbeat patterns over a 24-hour period. You may also have other tests, such as:  Transthoracic echocardiogram (TTE). During echocardiography, sound waves are used to evaluate how blood flows through your heart.  Transesophageal echocardiogram (TEE).  Cardiac monitoring. This allows your health care provider to monitor your heart rate and rhythm in real time.  Holter monitor. This is a portable device that records your heartbeat and can help diagnose heart arrhythmias. It allows your health care provider to track your heart activity for several days, if needed.  Stress tests by exercise or by giving  medicine that makes the heart beat faster. TREATMENT   Treatment depends on what may be causing your chest pain. Treatment may include:  Acid blockers for heartburn.  Anti-inflammatory medicine.  Pain medicine for inflammatory conditions.  Antibiotics if an infection is present.  You may be advised to change lifestyle habits. This includes stopping smoking and avoiding alcohol, caffeine, and chocolate.  You may be advised to keep your head raised (elevated) when sleeping. This reduces the chance of acid going backward from your stomach into your esophagus. Most of the time, nonspecific chest pain will improve within 2-3 days with rest and mild pain medicine.  HOME CARE INSTRUCTIONS   If antibiotics were prescribed, take them as directed. Finish them even if you start to feel better.  For the next few days, avoid physical activities that bring on chest pain. Continue physical activities as directed.  Do not use any tobacco products, including cigarettes, chewing tobacco, or electronic cigarettes.  Avoid drinking alcohol.  Only take medicine as directed by your health care provider.  Follow your health care provider's suggestions for further testing if your chest pain does not go away.  Keep any follow-up appointments you made. If you do not go to an appointment, you could develop lasting (chronic) problems with pain. If there is any problem keeping an appointment, call to reschedule. SEEK MEDICAL CARE IF:   Your chest pain does not go away, even after treatment.  You have a rash with blisters on your chest.  You have a fever. SEEK IMMEDIATE MEDICAL CARE IF:   You have increased  chest pain or pain that spreads to your arm, neck, jaw, back, or abdomen.  You have shortness of breath.  You have an increasing cough, or you cough up blood.  You have severe back or abdominal pain.  You feel nauseous or vomit.  You have severe weakness.  You faint.  You have  chills. This is an emergency. Do not wait to see if the pain will go away. Get medical help at once. Call your local emergency services (911 in [place] Do not drive yourself to the hospital. MAKE SURE YOU:   Understand these instructions.  Will watch your condition.  Will get help right away if you are not doing well or get worse. Document Released: 05/26/2005 Document Revised: 08/21/2013 Document Reviewed: 03/21/2008 [facility] Patient Information 2015 [place], [place]. This information is not intended to replace advice given to you by your health care provider. Make sure you discuss any questions you have with your health care provider.  Near-Syncope Near-syncope (commonly known as near fainting) is sudden weakness, dizziness, or feeling like you might pass out. During an episode of near-syncope, you may also develop pale skin, have tunnel vision, or feel sick to your stomach (nauseous). Near-syncope may occur when getting up after sitting or while standing for a long time. It is caused by a sudden decrease in blood flow to the brain. This decrease can result from various causes or triggers, most of which are not serious. However, because near-syncope can sometimes be a sign of something serious, a medical evaluation is required. The specific cause is often not determined. HOME CARE INSTRUCTIONS  Monitor your condition for any changes. The following actions may help to alleviate any discomfort you are experiencing:  Have someone stay with you until you feel stable.  Lie down right away and prop your feet up if you start feeling like you might faint. Breathe deeply and steadily. Wait until all the symptoms have passed. Most of these episodes last only a few minutes. You may feel tired for several hours.   Drink enough fluids to keep your urine clear or pale yellow.   If you are taking blood pressure or heart medicine, get up slowly when seated or lying down. Take several minutes to sit and then  stand. This can reduce dizziness.  Follow up with your health care provider as directed. SEEK IMMEDIATE MEDICAL CARE IF:   You have a severe headache.   You have unusual pain in the chest, abdomen, or back.   You are bleeding from the mouth or rectum, or you have black or tarry stool.   You have an irregular or very fast heartbeat.   You have repeated fainting or have seizure-like jerking during an episode.   You faint when sitting or lying down.   You have confusion.   You have difficulty walking.   You have severe weakness.   You have vision problems.  MAKE SURE YOU:   Understand these instructions.  Will watch your condition.  Will get help right away if you are not doing well or get worse. Document Released: 08/16/2005 Document Revised: 08/21/2013 Document Reviewed: 01/19/2013 [facility] Patient Information 2015 [place], [place]. This information is not intended to replace advice given to you by your health care provider. Make sure you discuss any questions you have with your health care provider.

## 2014-12-31 NOTE — ED Provider Notes (Signed)
CSN: [id number]     Arrival date & time 12/31/14  1341 History   First MD Initiated Contact with Patient 12/31/14 1508     Chief Complaint  Patient presents with  . Near Syncope     Patient is a 74 y.o. female presenting with near-syncope. The history is provided by the patient. No language interpreter was used.  Near Syncope   Mary [name] presents for evaluation of chest pain and near syncope.  She was at rest at 1pm today and she developed sudden onset left upper chest pain described as sharp and constant in nature.  She felt as if she might pass out.  She had associated headache, SOB, nausea.  She denies fevers, abdominal pain, vomiting.  She has chronic lower extremity edema, unchanged.  She has a hx/o afib, pacemaker, DM, DVT.  Sxs were severe and constant, now they have resolved.  Sxs were severe for 20 minutes and resolved over the course of two hours.   Past Medical History  Diagnosis Date  . Hypertension   . CAD (coronary artery disease)     non obstructive  . Hyperlipidemia   . Hypothyroidism   . DVT (deep venous thrombosis) 2013    "twice behind knee on left side" (06/25/2013)  . Foot fracture 2013    "left; fell; never had OR; just wrapped good and wore brace" (06/25/2013)  . Sleep apnea     "cleared up after I lost 134# w/gastric bypass" (06/25/2013)  . Iron deficiency anemia     "had 3 iron infusions in 2013" (06/25/2013)  . Headache(784.0)     "very frequent recently; don't usually have headaches at all" (06/25/2013)  . Arthritis     "very bad; knees and hands" (06/25/2013)  . Chronic lower back pain   . Colon cancer 2012    "started chemo 12/23/2010" (06/25/2013)  . Multinodular goiter   . Colon polyps   . CHF (congestive heart failure)   . Glaucoma   . Type II diabetes mellitus   . Diabetic peripheral neuropathy   . Falls infrequently   . Polyneuropathy in diabetes(357.2)    Past Surgical History  Procedure Laterality Date  . Roux-en-y gastric bypass  2009   . Cardiac catheterization  2006    Never had PCI, 3 caths total. Last one in [place]  . Cholecystectomy  1980's  . Umbilical hernia repair  1970's?     (06/25/2013)  . Ulnar tunnel release Left ~ 2008  . Cataract extraction, bilateral Bilateral 2013    "and put stent in my left eye for glaucoma" (06/25/2013)  . Eye muscle surgery Bilateral ~ 1963    "muscles too long; eyes would go out and up; tied muscles to hold my eyes straight" (06/25/2013)  . Portacath placement Left 11/2010  . Colectomy  10/2010    Tumor removal  . Pacemaker insertion  01/02/2014    STJ Assurity dual chamber pacemaker implnated by Dr Lovena Le for SSS  . Lead revision  01-03-2014    atrial lead revision by Dr Rayann Heman  . Permanent pacemaker insertion N/A 01/02/2014    Procedure: PERMANENT PACEMAKER INSERTION;  Surgeon: Evans Lance, MD;  Location: [facility] CATH LAB;  Service: Cardiovascular;  Laterality: N/A;  . Lead revision N/A 01/03/2014    Procedure: LEAD REVISION;  Surgeon: Coralyn Mark, MD;  Location: [facility] CATH LAB;  Service: Cardiovascular;  Laterality: N/A;   Family History  Problem Relation Age of Onset  . Breast cancer Mother   .  Heart disease Father   . Heart attack Father   . Heart attack Sister   . Heart attack Brother   . Breast cancer Sister   . Breast cancer Maternal Aunt     x 2 aunts  . Pancreatic cancer Brother   . Prostate cancer Brother   . Colon polyps Brother   . Bladder Cancer Cousin   . Clotting disorder Sister   . Diabetes Sister     x 3  . Diabetes Mother   . Diabetes Maternal Grandmother    History  Substance Use Topics  . Smoking status: Former Smoker -- 1.00 packs/day for 30 years    Types: Cigarettes    Quit date: 05/03/2013  . Smokeless tobacco: Never Used     Comment: 06/25/2013 "smoked off and on since I was 22; stopped 10/2007 til 08/30/2012 then started again cause of stress"  . Alcohol Use: Yes     Comment: 06/25/2013 "glass of wine 1-2 times/yr"   OB History    No data  available     Review of Systems  Cardiovascular: Positive for near-syncope.  All other systems reviewed and are negative.     Allergies  Other; Adhesive; Aspirin; Latex; and Nsaids  Home Medications   Prior to Admission medications   Medication Sig Start Date End Date Taking? Authorizing Provider  atorvastatin (LIPITOR) 80 MG tablet Take 1 tablet (80 mg total) by mouth daily. 09/18/14  Yes Belva Crome, MD  bimatoprost (LUMIGAN) 0.01 % SOLN Place 1 drop into the right eye at bedtime. Right eye only   Yes Historical Provider, MD  buPROPion (WELLBUTRIN SR) 150 MG 12 hr tablet Take 1 tablet (150 mg total) by mouth every morning. 12/26/14  Yes Ladell Pier, MD  Calcium Citrate-Vitamin D (CITRACAL + D PO) Take 1 tablet by mouth 2 (two) times daily.   Yes Historical Provider, MD  Choline Fenofibrate (FENOFIBRIC ACID) 135 MG CPDR TAKE ONE CAPSULE BY MOUTH EVERY NIGHT AT BEDTIME 07/11/14  Yes Belva Crome, MD  ELIQUIS 5 MG TABS tablet take 1 tablet by mouth twice a day 09/25/14  Yes [name], MD  gabapentin (NEURONTIN) 300 MG capsule Take 300 mg by mouth 2 (two) times daily.    Yes Historical Provider, MD  HYDROcodone-acetaminophen (NORCO/VICODIN) 5-325 MG per tablet Take 1 tablet by mouth every 6 (six) hours as needed for moderate pain. 11/21/14  Yes Ladell Pier, MD  insulin glargine (LANTUS) 100 UNIT/ML injection Inject 20 Units into the skin at bedtime.   Yes Historical Provider, MD  levothyroxine (SYNTHROID, LEVOTHROID) 125 MCG tablet Take 125 mcg by mouth daily before breakfast.   Yes Historical Provider, MD  lisinopril-hydrochlorothiazide (PRINZIDE,ZESTORETIC) 20-12.5 MG per tablet TAKE 2 TABLETs BY MOUTH EVERY EVENING 12/27/13  Yes [name], MD  metFORMIN (GLUCOPHAGE) 1000 MG tablet Take 1,000 mg by mouth 2 (two) times daily with a meal.    Yes Historical Provider, MD  cyanocobalamin (,VITAMIN B-12,) 1000 MCG/ML injection Inject 1,000 mcg into the skin every 30 (thirty) days.     Historical Provider, MD  levothyroxine (SYNTHROID, LEVOTHROID) 150 MCG tablet Take 150 mcg by mouth daily before breakfast.    Historical Provider, MD   BP 153/54 mmHg  Pulse 75  Temp(Src) 98.3 F (36.8 C) (Oral)  Resp 18  SpO2 95% Physical Exam  Constitutional: She is oriented to person, place, and time. She appears well-developed and well-nourished.  HENT:  Head: Normocephalic and atraumatic.  Cardiovascular: Normal  rate and regular rhythm.   No murmur heard. Pulmonary/Chest: Effort normal and breath sounds normal. No respiratory distress.  Abdominal: Soft. There is no tenderness. There is no rebound and no guarding.  Musculoskeletal: She exhibits no tenderness.  2+ nonpitting edema in BLE  Neurological: She is alert and oriented to person, place, and time.  Skin: Skin is warm and dry.  Psychiatric: She has a normal mood and affect. Her behavior is normal.  Nursing note and vitals reviewed.   ED Course  Procedures (including critical care time) Labs Review Labs Reviewed  BASIC METABOLIC PANEL - Abnormal; Notable for the following:    Chloride 100 (*)    Glucose, Bld 191 (*)    Creatinine, Ser 1.14 (*)    GFR calc non Af Amer 47 (*)    GFR calc Af Amer 54 (*)    All other components within normal limits  CBC  D-DIMER, QUANTITATIVE  I-STAT TROPOININ, ED  I-STAT TROPOININ, ED    Imaging Review Dg Chest 2 View  12/31/2014   CLINICAL DATA:  Syncope with left-sided chest pain  EXAM: CHEST  2 VIEW  COMPARISON:  Jan 04, 2014  FINDINGS: Lungs are clear. Heart size pulmonary vascularity are normal. No adenopathy. Pacemaker leads are attached to the right atrium and right ventricle. There are surgical clips in the upper abdomen.  IMPRESSION: No edema or consolidation.   Electronically Signed   By: Lowella Grip III M.D.   On: 12/31/2014 14:25     EKG Interpretation None     ED ECG REPORT   Date: 12/31/2014  Rate: 72   Rhythm: paced  Conduction Disutrbances:left  bundle branch block  Narrative Interpretation:   Old EKG Reviewed: unchanged  I have personally reviewed the EKG tracing and agree with the computerized printout as noted.   MDM   Final diagnoses:  Chest pain, unspecified chest pain type  Near syncope    Patient here for evaluation of chest pain and near syncopal episode. Chest pain has resolved upon evaluation in the emergency department. Pacemaker was interrogated with no acute events. Clinical picture is not consistent with PE, d-dimer is negative. Presentation is not consistent with ACS or significant arrhythmia. Doubt dissection. On repeat evaluation patient continues to be asymptomatic in the emergency department. Discussed with patient option of staying in the hospital for stress testing and patient declined. Plan for outpatient cardiology follow-up with very close return precautions. BMP with mild renal insufficiency, similar to priors.    Quintella Reichert, MD 12/31/14 [phone]

## 2014-12-31 NOTE — ED Notes (Signed)
[employer] pacemaker interrogated by Mali Gross, RN.

## 2014-12-31 NOTE — ED Notes (Signed)
MD at bedside. 

## 2014-12-31 NOTE — ED Notes (Addendum)
Note made in error

## 2015-01-01 ENCOUNTER — Telehealth: Payer: Self-pay | Admitting: Internal Medicine

## 2015-01-01 DIAGNOSIS — I2510 Atherosclerotic heart disease of native coronary artery without angina pectoris: Secondary | ICD-10-CM

## 2015-01-01 NOTE — Telephone Encounter (Signed)
I reviewed patient's complaint with Truitt Merle, NP, FLEX APP who states patient needs lexiscan myocardial perfusion study to r/o ischemia due to hx of CAD.  Patient's pacemaker report does not reveal any rate or rhythm abnormalities and patient was advised at ED yesterday to get stress test.  Truitt Merle, NP advised that we order myoview, keep appointment with Dr. Rayann Heman on 5/16, and that she return to [facility] ED if symptoms return.  I called and discussed with patient who verbalized understanding and agreement.  I advised her that someone from our office will call her to schedule the test and I reviewed the pre-procedure instructions with her.  Patient verbalized understanding.

## 2015-01-01 NOTE — Telephone Encounter (Signed)
New message         Pt would like to know if allred would like to see her sooner due to her recent hospital visit

## 2015-01-01 NOTE — Telephone Encounter (Signed)
Spoke with patient who states she was seen in [facility] ED yesterday for sudden onset of pre-syncope, severe nausea, palpitations, extreme fatigue.  Patient states she was sitting in the car waiting for her husband to make a purchase in a store when she was overcome with nausea and feeling like she was going to pass out.  She states she could not even lift her arms to get her cell phone out of her purse.  States her husband drove her to the hospital where she was evaluated; states she was d/c'ed home only because she agreed to call our office today for evaluation.  I advised patient that Dr. Rayann Heman is not in the office today but that I will discuss her symptoms with our FLEX APP to determine appropriate follow-up.  Patient has appointment with Dr. Rayann Heman for 5/16.  She states today she feels weak and tired.  I advised patient that I will call her back with Truitt Merle, NP advice. Pt verbalized understanding and agreement.

## 2015-01-06 ENCOUNTER — Telehealth (HOSPITAL_COMMUNITY): Payer: Self-pay

## 2015-01-06 NOTE — Telephone Encounter (Signed)
Patient given detailed instructions per Myocardial Perfusion Study Information Sheet for test on 01-07-2015 at 8:30am. Patient verbalized understanding. Mary [name], [name] A

## 2015-01-07 ENCOUNTER — Ambulatory Visit: Payer: Medicare Other | Admitting: Internal Medicine

## 2015-01-07 ENCOUNTER — Ambulatory Visit (HOSPITAL_COMMUNITY): Payer: Medicare Other | Attending: Cardiovascular Disease

## 2015-01-07 ENCOUNTER — Encounter (HOSPITAL_COMMUNITY): Payer: Medicare Other

## 2015-01-07 DIAGNOSIS — I447 Left bundle-branch block, unspecified: Secondary | ICD-10-CM | POA: Diagnosis not present

## 2015-01-07 DIAGNOSIS — R0609 Other forms of dyspnea: Secondary | ICD-10-CM | POA: Insufficient documentation

## 2015-01-07 DIAGNOSIS — R079 Chest pain, unspecified: Secondary | ICD-10-CM | POA: Diagnosis not present

## 2015-01-07 DIAGNOSIS — I2510 Atherosclerotic heart disease of native coronary artery without angina pectoris: Secondary | ICD-10-CM | POA: Diagnosis not present

## 2015-01-07 DIAGNOSIS — I10 Essential (primary) hypertension: Secondary | ICD-10-CM | POA: Insufficient documentation

## 2015-01-07 DIAGNOSIS — I4891 Unspecified atrial fibrillation: Secondary | ICD-10-CM | POA: Diagnosis not present

## 2015-01-07 DIAGNOSIS — R9439 Abnormal result of other cardiovascular function study: Secondary | ICD-10-CM | POA: Insufficient documentation

## 2015-01-07 DIAGNOSIS — E119 Type 2 diabetes mellitus without complications: Secondary | ICD-10-CM | POA: Insufficient documentation

## 2015-01-07 MED ORDER — REGADENOSON 0.4 MG/5ML IV SOLN
0.4000 mg | Freq: Once | INTRAVENOUS | Status: AC
  Administered 2015-01-07: 0.4 mg via INTRAVENOUS

## 2015-01-07 MED ORDER — TECHNETIUM TC 99M SESTAMIBI GENERIC - CARDIOLITE
33.0000 | Freq: Once | INTRAVENOUS | Status: AC | PRN
  Administered 2015-01-07: 33 via INTRAVENOUS

## 2015-01-09 ENCOUNTER — Ambulatory Visit (HOSPITAL_COMMUNITY): Payer: Medicare Other | Attending: Cardiology

## 2015-01-09 LAB — MYOCARDIAL PERFUSION IMAGING
Estimated workload: 1 METS
LV dias vol: 119 mL
LV sys vol: 44 mL
Nuc Stress EF: 63 %
Peak HR: 72 {beats}/min
Percent of predicted max HR: 48 %
RATE: 0.33
Rest HR: 64 {beats}/min
SDS: 0
SRS: 2
SSS: 2
Stage 1 DBP: 63 mmHg
Stage 1 Grade: 0 %
Stage 1 HR: 60 {beats}/min
Stage 1 SBP: 133 mmHg
Stage 1 Speed: 0 mph
Stage 2 Grade: 0 %
Stage 2 HR: 60 {beats}/min
Stage 2 Speed: 0 mph
Stage 3 DBP: 49 mmHg
Stage 3 Grade: 0 %
Stage 3 HR: 66 {beats}/min
Stage 3 SBP: 148 mmHg
Stage 3 Speed: 0 mph
Stage 4 Grade: 0 %
Stage 4 HR: 72 {beats}/min
Stage 4 Speed: 0 mph
Stage 5 DBP: 49 mmHg
Stage 5 Grade: 0 %
Stage 5 HR: 65 {beats}/min
Stage 5 SBP: 119 mmHg
Stage 5 Speed: 0 mph
Stage 6 Grade: 0 %
Stage 6 Speed: 0 mph
TID: 1.1

## 2015-01-09 MED ORDER — TECHNETIUM TC 99M SESTAMIBI GENERIC - CARDIOLITE
33.0000 | Freq: Once | INTRAVENOUS | Status: AC | PRN
  Administered 2015-01-09: 33 via INTRAVENOUS

## 2015-01-13 ENCOUNTER — Ambulatory Visit (INDEPENDENT_AMBULATORY_CARE_PROVIDER_SITE_OTHER): Payer: Medicare Other | Admitting: Internal Medicine

## 2015-01-13 ENCOUNTER — Encounter: Payer: Self-pay | Admitting: Internal Medicine

## 2015-01-13 VITALS — BP 157/78 | HR 68 | Ht 67.0 in | Wt 251.0 lb

## 2015-01-13 DIAGNOSIS — I480 Paroxysmal atrial fibrillation: Secondary | ICD-10-CM

## 2015-01-13 DIAGNOSIS — I442 Atrioventricular block, complete: Secondary | ICD-10-CM

## 2015-01-13 DIAGNOSIS — R079 Chest pain, unspecified: Secondary | ICD-10-CM | POA: Insufficient documentation

## 2015-01-13 DIAGNOSIS — R0789 Other chest pain: Secondary | ICD-10-CM

## 2015-01-13 DIAGNOSIS — Z950 Presence of cardiac pacemaker: Secondary | ICD-10-CM | POA: Diagnosis not present

## 2015-01-13 DIAGNOSIS — I2510 Atherosclerotic heart disease of native coronary artery without angina pectoris: Secondary | ICD-10-CM | POA: Diagnosis not present

## 2015-01-13 LAB — CUP PACEART INCLINIC DEVICE CHECK
Battery Voltage: 3.01 V
Date Time Interrogation Session: 20160516172749
Lead Channel Impedance Value: 375 Ohm
Lead Channel Impedance Value: 450 Ohm
Lead Channel Pacing Threshold Amplitude: 0.625 V
Lead Channel Pacing Threshold Amplitude: 1 V
Lead Channel Pacing Threshold Pulse Width: 0.4 ms
Lead Channel Pacing Threshold Pulse Width: 0.6 ms
Lead Channel Pacing Threshold Pulse Width: 0.6 ms
Lead Channel Setting Pacing Amplitude: 0.875
Lead Channel Setting Pacing Amplitude: 2.5 V
Lead Channel Setting Pacing Pulse Width: 0.4 ms
MDC IDC MSMT BATTERY REMAINING LONGEVITY: 118.8 mo
MDC IDC MSMT LEADCHNL RA PACING THRESHOLD AMPLITUDE: 1 V
MDC IDC MSMT LEADCHNL RA SENSING INTR AMPL: 1.4 mV
MDC IDC MSMT LEADCHNL RV SENSING INTR AMPL: 10.2 mV
MDC IDC SET LEADCHNL RV SENSING SENSITIVITY: 2 mV
MDC IDC STAT BRADY RA PERCENT PACED: 13 %
MDC IDC STAT BRADY RV PERCENT PACED: 99.94 %
Pulse Gen Serial Number: 3013730

## 2015-01-13 MED ORDER — NITROGLYCERIN 0.4 MG SL SUBL: 0.4000 mg | SUBLINGUAL_TABLET | SUBLINGUAL | Status: DC | PRN

## 2015-01-13 NOTE — Patient Instructions (Addendum)
Medication Instructions:  Your physician has recommended you make the following change in your medication:  1)  NTG 0.4 mg take as directed    Labwork: None ordered  Testing/Procedures: None ordered  Follow-Up: Remote monitoring is used to monitor your pacemaker from home. This monitoring reduces the number of office visits required to check your device to one time per year. It allows [place] to keep an eye on the functioning of your device to ensure it is working properly. You are scheduled for a device check from home on 04/14/2015. You may send your transmission at any time that day. If you have a wireless device, the transmission will be sent automatically. After your physician reviews your transmission, you will receive a postcard with your next transmission date.  Your physician recommends that you schedule a follow-up appointment in: 12 months with Jewel Baize, NP  Any Other Special Instructions Will Be Listed Below (If Applicable).

## 2015-01-13 NOTE — Progress Notes (Signed)
PCP: Olga Millers, MD Primary Cardiologist:  Dr Loni Beckwith Mary [name] is a 74 y.o. female who presents today for routine electrophysiology followup.  The patient reports doing very well. Her energy and SOB are stable.  She did have an episode of chest discomfort recently which led to an ER visit.  Her discomfort was somewhat atypical and has not returned.  She does not typically have exertional symptoms.  She had a myoview recently which was abnormal.  Today, she denies symptoms of palpitations,  dizziness, presyncope, or syncope.  Her edema is stable.  The patient is otherwise without complaint today.   Past Medical History  Diagnosis Date  . Hypertension   . CAD (coronary artery disease)     non obstructive  . Hyperlipidemia   . Hypothyroidism   . DVT (deep venous thrombosis) 2013    "twice behind knee on left side" (06/25/2013)  . Foot fracture 2013    "left; fell; never had OR; just wrapped good and wore brace" (06/25/2013)  . Sleep apnea     "cleared up after I lost 134# w/gastric bypass" (06/25/2013)  . Iron deficiency anemia     "had 3 iron infusions in 2013" (06/25/2013)  . Headache(784.0)     "very frequent recently; don't usually have headaches at all" (06/25/2013)  . Arthritis     "very bad; knees and hands" (06/25/2013)  . Chronic lower back pain   . Colon cancer 2012    "started chemo 12/23/2010" (06/25/2013)  . Multinodular goiter   . Colon polyps   . CHF (congestive heart failure)   . Glaucoma   . Type II diabetes mellitus   . Diabetic peripheral neuropathy   . Falls infrequently   . Polyneuropathy in diabetes(357.2)    Past Surgical History  Procedure Laterality Date  . Roux-en-y gastric bypass  2009  . Cardiac catheterization  2006    Never had PCI, 3 caths total. Last one in [place]  . Cholecystectomy  1980's  . Umbilical hernia repair  1970's?     (06/25/2013)  . Ulnar tunnel release Left ~ 2008  . Cataract extraction, bilateral Bilateral 2013    "and put stent in my left eye for glaucoma" (06/25/2013)  . Eye muscle surgery Bilateral ~ 1963    "muscles too long; eyes would go out and up; tied muscles to hold my eyes straight" (06/25/2013)  . Portacath placement Left 11/2010  . Colectomy  10/2010    Tumor removal  . Pacemaker insertion  01/02/2014    STJ Assurity dual chamber pacemaker implnated by Dr Lovena Le for SSS  . Lead revision  01-03-2014    atrial lead revision by Dr Rayann Heman  . Permanent pacemaker insertion N/A 01/02/2014    Procedure: PERMANENT PACEMAKER INSERTION;  Surgeon: Evans Lance, MD;  Location: [facility] CATH LAB;  Service: Cardiovascular;  Laterality: N/A;  . Lead revision N/A 01/03/2014    Procedure: LEAD REVISION;  Surgeon: Coralyn Mark, MD;  Location: [place] CATH LAB;  Service: Cardiovascular;  Laterality: N/A;    ROS- all systems are reviewed and negative except as per HPI above  Current Outpatient Prescriptions  Medication Sig Dispense Refill  . atorvastatin (LIPITOR) 80 MG tablet Take 1 tablet (80 mg total) by mouth daily. 30 tablet 6  . bimatoprost (LUMIGAN) 0.01 % SOLN Place 1 drop into the right eye at bedtime. Right eye only    . buPROPion (WELLBUTRIN SR) 150 MG 12 hr tablet Take 1 tablet (150  mg total) by mouth every morning. 15 tablet 0  . Calcium Citrate-Vitamin D (CITRACAL + D PO) Take 1 tablet by mouth 2 (two) times daily.    . Choline Fenofibrate (FENOFIBRIC ACID) 135 MG CPDR TAKE ONE CAPSULE BY MOUTH EVERY NIGHT AT BEDTIME 30 capsule 6  . cyanocobalamin (,VITAMIN B-12,) 1000 MCG/ML injection Inject 1,000 mcg into the skin every 30 (thirty) days.    Marland Kitchen ELIQUIS 5 MG TABS tablet take 1 tablet by mouth twice a day 60 tablet 3  . gabapentin (NEURONTIN) 300 MG capsule Take 300 mg by mouth 2 (two) times daily.     Marland Kitchen HYDROcodone-acetaminophen (NORCO/VICODIN) 5-325 MG per tablet Take 1 tablet by mouth every 6 (six) hours as needed for moderate pain. 30 tablet 0  . insulin glargine (LANTUS) 100 UNIT/ML injection Inject 20  Units into the skin at bedtime.    Marland Kitchen levothyroxine (SYNTHROID, LEVOTHROID) 125 MCG tablet Take 125 mcg by mouth daily before breakfast.    . lisinopril-hydrochlorothiazide (PRINZIDE,ZESTORETIC) 20-12.5 MG per tablet TAKE 2 TABLETs BY MOUTH EVERY EVENING    . metFORMIN (GLUCOPHAGE) 1000 MG tablet Take 1,000 mg by mouth 2 (two) times daily with a meal.     . nitroGLYCERIN (NITROSTAT) 0.4 MG SL tablet Place 1 tablet (0.4 mg total) under the tongue every 5 (five) minutes as needed for chest pain. 90 tablet 3   No current facility-administered medications for this visit.   ROS- all systems are reviewed and negative except as per HPI above  Physical Exam: Filed Vitals:   01/13/15 1513  BP: 157/78  Pulse: 68  Height: 5\' 7"  (1.702 m)  Weight: 251 lb (113.853 kg)    GEN- The patient is well appearing, alert and oriented x 3 today.   Head- normocephalic, atraumatic Eyes-  Sclera clear, conjunctiva pink Ears- hearing intact Oropharynx- clear Lungs- Clear to ausculation bilaterally, normal work of breathing Chest- pacemaker pocket is well healed,  There is a notable divit over the device but no signs of infection otherwise Heart- Regular rate and rhythm, no murmurs, rubs or gallops, PMI not laterally displaced GI- soft, NT, ND, + BS Extremities- no clubbing, cyanosis, + edema with support hose in place  Pacemaker interrogation- reviewed in detail today,  See PACEART report ekg reveals sinus with V pacing  Assessment and Plan:  1. Second degree AV block Normal pacemaker function See Pace Art report No changes today  2. HTN Stable No change required today  3. Obesity Weight loss is advised Body mass index is 39.3 kg/(m^2).  4. afib Continue eliquis Hold off on AAD therapy unless her afib burden increases   5. Chest discomfort I think that this was probably atypical.  Her myoview is moderately abnormal.  I did review this with Dr [name] today.  We agree that a conservative  approach is probably best unless her symptoms worsen.  I have given a script for slNTG today.  Follow-up with Dr [name] as scheduled in several weeks.  She is instructed to contact our office should she develop any worsening chest discomfort in the interim.  Merlin Return to see me in 1 year

## 2015-01-27 ENCOUNTER — Encounter (HOSPITAL_COMMUNITY): Payer: Self-pay | Admitting: Emergency Medicine

## 2015-01-27 ENCOUNTER — Inpatient Hospital Stay (HOSPITAL_COMMUNITY)
Admission: EM | Admit: 2015-01-27 | Discharge: 2015-01-28 | DRG: 303 | Disposition: A | Discharge status: Home / Self Care | Payer: Medicare Other | Attending: Cardiology | Admitting: Cardiology

## 2015-01-27 ENCOUNTER — Emergency Department (HOSPITAL_COMMUNITY): Payer: Medicare Other

## 2015-01-27 DIAGNOSIS — Z794 Long term (current) use of insulin: Secondary | ICD-10-CM

## 2015-01-27 DIAGNOSIS — Z9884 Bariatric surgery status: Secondary | ICD-10-CM | POA: Diagnosis not present

## 2015-01-27 DIAGNOSIS — Z833 Family history of diabetes mellitus: Secondary | ICD-10-CM

## 2015-01-27 DIAGNOSIS — Z8249 Family history of ischemic heart disease and other diseases of the circulatory system: Secondary | ICD-10-CM | POA: Diagnosis not present

## 2015-01-27 DIAGNOSIS — Z91048 Other nonmedicinal substance allergy status: Secondary | ICD-10-CM

## 2015-01-27 DIAGNOSIS — Z87891 Personal history of nicotine dependence: Secondary | ICD-10-CM | POA: Diagnosis not present

## 2015-01-27 DIAGNOSIS — R51 Headache: Secondary | ICD-10-CM | POA: Diagnosis not present

## 2015-01-27 DIAGNOSIS — E785 Hyperlipidemia, unspecified: Secondary | ICD-10-CM | POA: Diagnosis present

## 2015-01-27 DIAGNOSIS — E118 Type 2 diabetes mellitus with unspecified complications: Secondary | ICD-10-CM | POA: Diagnosis present

## 2015-01-27 DIAGNOSIS — R42 Dizziness and giddiness: Secondary | ICD-10-CM | POA: Diagnosis not present

## 2015-01-27 DIAGNOSIS — Z950 Presence of cardiac pacemaker: Secondary | ICD-10-CM | POA: Diagnosis present

## 2015-01-27 DIAGNOSIS — I200 Unstable angina: Secondary | ICD-10-CM | POA: Diagnosis not present

## 2015-01-27 DIAGNOSIS — Z85038 Personal history of other malignant neoplasm of large intestine: Secondary | ICD-10-CM | POA: Diagnosis not present

## 2015-01-27 DIAGNOSIS — E1169 Type 2 diabetes mellitus with other specified complication: Secondary | ICD-10-CM | POA: Diagnosis present

## 2015-01-27 DIAGNOSIS — I10 Essential (primary) hypertension: Secondary | ICD-10-CM | POA: Diagnosis present

## 2015-01-27 DIAGNOSIS — E042 Nontoxic multinodular goiter: Secondary | ICD-10-CM | POA: Diagnosis present

## 2015-01-27 DIAGNOSIS — I6782 Cerebral ischemia: Secondary | ICD-10-CM | POA: Diagnosis present

## 2015-01-27 DIAGNOSIS — E1142 Type 2 diabetes mellitus with diabetic polyneuropathy: Secondary | ICD-10-CM | POA: Diagnosis not present

## 2015-01-27 DIAGNOSIS — I25110 Atherosclerotic heart disease of native coronary artery with unstable angina pectoris: Secondary | ICD-10-CM | POA: Diagnosis present

## 2015-01-27 DIAGNOSIS — I2510 Atherosclerotic heart disease of native coronary artery without angina pectoris: Secondary | ICD-10-CM | POA: Diagnosis present

## 2015-01-27 DIAGNOSIS — H539 Unspecified visual disturbance: Secondary | ICD-10-CM | POA: Diagnosis not present

## 2015-01-27 DIAGNOSIS — I25118 Atherosclerotic heart disease of native coronary artery with other forms of angina pectoris: Secondary | ICD-10-CM | POA: Diagnosis present

## 2015-01-27 DIAGNOSIS — E039 Hypothyroidism, unspecified: Secondary | ICD-10-CM | POA: Diagnosis present

## 2015-01-27 DIAGNOSIS — Z7902 Long term (current) use of antithrombotics/antiplatelets: Secondary | ICD-10-CM | POA: Diagnosis not present

## 2015-01-27 DIAGNOSIS — R0789 Other chest pain: Secondary | ICD-10-CM | POA: Diagnosis not present

## 2015-01-27 DIAGNOSIS — I480 Paroxysmal atrial fibrillation: Secondary | ICD-10-CM | POA: Diagnosis present

## 2015-01-27 DIAGNOSIS — Z886 Allergy status to analgesic agent status: Secondary | ICD-10-CM

## 2015-01-27 DIAGNOSIS — Z79899 Other long term (current) drug therapy: Secondary | ICD-10-CM | POA: Diagnosis not present

## 2015-01-27 DIAGNOSIS — Z91040 Latex allergy status: Secondary | ICD-10-CM | POA: Diagnosis not present

## 2015-01-27 DIAGNOSIS — Z86718 Personal history of other venous thrombosis and embolism: Secondary | ICD-10-CM | POA: Diagnosis not present

## 2015-01-27 DIAGNOSIS — G464 Cerebellar stroke syndrome: Secondary | ICD-10-CM | POA: Diagnosis not present

## 2015-01-27 LAB — CBC WITH DIFFERENTIAL/PLATELET
Basophils Absolute: 0.1 10*3/uL (ref 0.0–0.1)
Basophils Relative: 1 % (ref 0–1)
EOS PCT: 2 % (ref 0–5)
Eosinophils Absolute: 0.1 10*3/uL (ref 0.0–0.7)
HEMATOCRIT: 37.3 % (ref 36.0–46.0)
HEMOGLOBIN: 12.1 g/dL (ref 12.0–15.0)
LYMPHS PCT: 25 % (ref 12–46)
Lymphs Abs: 1.7 10*3/uL (ref 0.7–4.0)
MCH: 28.9 pg (ref 26.0–34.0)
MCHC: 32.4 g/dL (ref 30.0–36.0)
MCV: 89.2 fL (ref 78.0–100.0)
MONOS PCT: 7 % (ref 3–12)
Monocytes Absolute: 0.5 10*3/uL (ref 0.1–1.0)
Neutro Abs: 4.7 10*3/uL (ref 1.7–7.7)
Neutrophils Relative %: 67 % (ref 43–77)
PLATELETS: 306 10*3/uL (ref 150–400)
RBC: 4.18 MIL/uL (ref 3.87–5.11)
RDW: 13.5 % (ref 11.5–15.5)
WBC: 7.1 10*3/uL (ref 4.0–10.5)

## 2015-01-27 LAB — BRAIN NATRIURETIC PEPTIDE: B Natriuretic Peptide: 55.7 pg/mL (ref 0.0–100.0)

## 2015-01-27 LAB — HEPARIN LEVEL (UNFRACTIONATED): HEPARIN UNFRACTIONATED: 0.65 [IU]/mL (ref 0.30–0.70)

## 2015-01-27 LAB — I-STAT CHEM 8, ED
BUN: 28 mg/dL — ABNORMAL HIGH (ref 6–20)
CHLORIDE: 101 mmol/L (ref 101–111)
Calcium, Ion: 1.17 mmol/L (ref 1.13–1.30)
Creatinine, Ser: 1.3 mg/dL — ABNORMAL HIGH (ref 0.44–1.00)
GLUCOSE: 230 mg/dL — AB (ref 65–99)
HCT: 40 % (ref 36.0–46.0)
HEMOGLOBIN: 13.6 g/dL (ref 12.0–15.0)
Potassium: 4.9 mmol/L (ref 3.5–5.1)
Sodium: 137 mmol/L (ref 135–145)
TCO2: 24 mmol/L (ref 0–100)

## 2015-01-27 LAB — TSH: TSH: 0.314 u[IU]/mL — AB (ref 0.350–4.500)

## 2015-01-27 LAB — MRSA PCR SCREENING: MRSA by PCR: POSITIVE — AB

## 2015-01-27 LAB — COMPREHENSIVE METABOLIC PANEL
ALT: 12 U/L — ABNORMAL LOW (ref 14–54)
AST: 15 U/L (ref 15–41)
Albumin: 3.5 g/dL (ref 3.5–5.0)
Alkaline Phosphatase: 48 U/L (ref 38–126)
Anion gap: 8 (ref 5–15)
BUN: 27 mg/dL — ABNORMAL HIGH (ref 6–20)
CO2: 25 mmol/L (ref 22–32)
CREATININE: 1.25 mg/dL — AB (ref 0.44–1.00)
Calcium: 8.4 mg/dL — ABNORMAL LOW (ref 8.9–10.3)
Chloride: 102 mmol/L (ref 101–111)
GFR calc Af Amer: 48 mL/min — ABNORMAL LOW (ref >60)
GFR calc non Af Amer: 42 mL/min — ABNORMAL LOW (ref >60)
Glucose, Bld: 226 mg/dL — ABNORMAL HIGH (ref 65–99)
Potassium: 4.5 mmol/L (ref 3.5–5.1)
SODIUM: 135 mmol/L (ref 135–145)
TOTAL PROTEIN: 6.6 g/dL (ref 6.5–8.1)
Total Bilirubin: 0.4 mg/dL (ref 0.3–1.2)

## 2015-01-27 LAB — GLUCOSE, CAPILLARY: Glucose-Capillary: 236 mg/dL — ABNORMAL HIGH (ref 65–99)

## 2015-01-27 LAB — TROPONIN I: Troponin I: <0.03 ng/mL (ref <0.031)

## 2015-01-27 LAB — APTT: APTT: 35 s (ref 24–37)

## 2015-01-27 MED ORDER — HEPARIN (PORCINE) IN NACL 100-0.45 UNIT/ML-% IJ SOLN
1550.0000 [IU]/h | INTRAMUSCULAR | Status: DC
  Administered 2015-01-27: 1400 [IU]/h via INTRAVENOUS
  Filled 2015-01-27 (×3): qty 250

## 2015-01-27 MED ORDER — ACETAMINOPHEN 325 MG PO TABS: 650.0000 mg | ORAL_TABLET | ORAL | Status: DC | PRN

## 2015-01-27 MED ORDER — ATORVASTATIN CALCIUM 80 MG PO TABS
80.0000 mg | ORAL_TABLET | Freq: Every day | ORAL | Status: DC
  Administered 2015-01-27: 80 mg via ORAL
  Filled 2015-01-27 (×2): qty 1

## 2015-01-27 MED ORDER — LEVOTHYROXINE SODIUM 125 MCG PO TABS
125.0000 ug | ORAL_TABLET | Freq: Every day | ORAL | Status: DC
  Administered 2015-01-28: 125 ug via ORAL
  Filled 2015-01-27 (×2): qty 1

## 2015-01-27 MED ORDER — FENOFIBRATE 160 MG PO TABS
160.0000 mg | ORAL_TABLET | Freq: Every day | ORAL | Status: DC
  Administered 2015-01-27: 160 mg via ORAL
  Filled 2015-01-27 (×2): qty 1

## 2015-01-27 MED ORDER — LATANOPROST 0.005 % OP SOLN
1.0000 [drp] | Freq: Every day | OPHTHALMIC | Status: DC
  Administered 2015-01-27: 1 [drp] via OPHTHALMIC
  Filled 2015-01-27: qty 2.5

## 2015-01-27 MED ORDER — SODIUM CHLORIDE 0.9 % IV SOLN: 250.0000 mL | INTRAVENOUS | Status: DC | PRN

## 2015-01-27 MED ORDER — BUPROPION HCL ER (SR) 150 MG PO TB12
150.0000 mg | ORAL_TABLET | Freq: Every morning | ORAL | Status: DC
  Administered 2015-01-28: 150 mg via ORAL
  Filled 2015-01-27: qty 1

## 2015-01-27 MED ORDER — SODIUM CHLORIDE 0.9 % IJ SOLN: 3.0000 mL | INTRAMUSCULAR | Status: DC | PRN

## 2015-01-27 MED ORDER — INSULIN ASPART 100 UNIT/ML ~~LOC~~ SOLN
0.0000 [IU] | Freq: Every day | SUBCUTANEOUS | Status: DC
  Administered 2015-01-27: 2 [IU] via SUBCUTANEOUS

## 2015-01-27 MED ORDER — ONDANSETRON HCL 4 MG/2ML IJ SOLN: 4.0000 mg | Freq: Four times a day (QID) | INTRAMUSCULAR | Status: DC | PRN

## 2015-01-27 MED ORDER — NITROGLYCERIN 0.4 MG SL SUBL: 0.4000 mg | SUBLINGUAL_TABLET | SUBLINGUAL | Status: DC | PRN

## 2015-01-27 MED ORDER — SODIUM CHLORIDE 0.9 % IV SOLN: INTRAVENOUS | Status: DC

## 2015-01-27 MED ORDER — LISINOPRIL 20 MG PO TABS
20.0000 mg | ORAL_TABLET | Freq: Every day | ORAL | Status: DC
  Administered 2015-01-27 – 2015-01-28 (×2): 20 mg via ORAL
  Filled 2015-01-27 (×2): qty 1

## 2015-01-27 MED ORDER — GABAPENTIN 300 MG PO CAPS
300.0000 mg | ORAL_CAPSULE | Freq: Two times a day (BID) | ORAL | Status: DC
  Administered 2015-01-27 – 2015-01-28 (×2): 300 mg via ORAL
  Filled 2015-01-27 (×3): qty 1

## 2015-01-27 MED ORDER — INSULIN ASPART 100 UNIT/ML ~~LOC~~ SOLN
0.0000 [IU] | Freq: Three times a day (TID) | SUBCUTANEOUS | Status: DC
  Administered 2015-01-28 (×2): 4 [IU] via SUBCUTANEOUS

## 2015-01-27 MED ORDER — FENOFIBRIC ACID 135 MG PO CPDR: 1.0000 | DELAYED_RELEASE_CAPSULE | Freq: Every day | ORAL | Status: DC

## 2015-01-27 MED ORDER — SODIUM CHLORIDE 0.9 % IJ SOLN
3.0000 mL | Freq: Two times a day (BID) | INTRAMUSCULAR | Status: DC
  Administered 2015-01-28: 3 mL via INTRAVENOUS

## 2015-01-27 MED ORDER — INSULIN GLARGINE 100 UNIT/ML ~~LOC~~ SOLN
20.0000 [IU] | Freq: Every day | SUBCUTANEOUS | Status: DC
  Administered 2015-01-27: 20 [IU] via SUBCUTANEOUS
  Filled 2015-01-27 (×2): qty 0.2

## 2015-01-27 NOTE — H&P (Addendum)
History and Physical   Patient ID: Mary [name], MRN: [id number], DOB: Oct 20, 1940   Date of Encounter: 01/27/2015, 3:35 PM  Primary Care Provider: Olga Millers, MD Cardiologist: Dr. Daneen Schick  Electrophysiologist:  Dr. [name]   Chief Complaint:  Chest Pain  History of Present Illness: Mary [name] is a 74 y.o. female nonobstructive CAD by remote cardiac catheterization, PAF, diabetes, HTN, HL, hypothyroidism, colon CA, prior DVT, prior gastric bypass surgery. Patient moved back to the area from [place] in 2014. She underwent implantation of a dual-chamber pacemaker in 12/2013 secondary to symptomatic bradycardia with complete heart block. She is on Eliquis for anticoagulation. Patient recently presented to the emergency room with chest pain. She was set up for a stress test as an outpatient. This was performed 14-Jan-2015 and was intermediate risk with evidence of prior infarct with peri-infarct ischemia, EF 55-65%. There was a moderate area of anterolateral wall ischemia. The patient was seen by Dr. Rayann Heman in the office. He reviewed the patient's nuclear study with Dr. [name]. Conservative management was recommended. The patient was to follow-up with Dr. [name] 02/07/15 for follow-up.  She presented to the emergency room today after an episode of chest pain, shortness of breath, nausea, dizziness, chills accompanied by headache occurring this morning shortly after awakening. She had a similar episode several weeks ago and reported to the emergency room. This prompted her outpatient stress test. Her symptoms today began suddenly. It started with a headache. She felt quite weak with this and describes chest pressure, dyspnea as well as neck pressure and heaviness. She also describes a spinning sensation. She was unable to speak but denies unilateral weakness or facial droop. She denies vomiting. She did feel as though she might pass out. She denies frank syncope. She did take her husband's  nitroglycerin 2 with relief. She feels well now without any significant symptoms. She is fairly sedentary. She has significant back and knee problems. She has not felt well for about 6 months. She is not able to exert herself significantly. She denies orthopnea, PND. She has chronic pedal edema without significant change. She has recently started back to smoking. She notes a chronic nonproductive cough without significant change.   Recent Studies Reviewed Today:  Myoview 01-14-15 Study Impression:  Myocardial perfusion is abnormal. Findings consistent with prior myocardial infarction with peri-infarct ischemia. This is an intermediate risk study. Overall left ventricular systolic function was normal. LV cavity size is normal. The left ventricular ejection fraction is normal (55-65%). Baseline ECG paced with LBBB. Multiple perfusion defects. Small area of apical scar. Small area of partially reversible inferobasal defect Moderate sized area of moderate anterolateral wall ischemia. Suggest further w/u given recent ER visit for chest pain and no known CAD  Echo 05/04/13 - Left ventricle: The cavity size was normal. Wall thickness was increased in a pattern of mild LVH. Systolic function was normal. The estimated ejection fraction was in the range of 55% to 60%. Wall motion was normal; there were no regional wall motion abnormalities. - Mitral valve: Calcified annulus. - Left atrium: The atrium was mildly dilated.   Past Medical History  Diagnosis Date  . Hypertension   . CAD (coronary artery disease)     non obstructive  . Hyperlipidemia   . Hypothyroidism   . DVT (deep venous thrombosis) 2013    "twice behind knee on left side" (06/25/2013)  . Foot fracture 2013    "left; fell; never had OR; just wrapped good and  wore brace" (06/25/2013)  . Sleep apnea     "cleared up after I lost 134# w/gastric bypass" (06/25/2013)  . Iron deficiency anemia     "had 3 iron infusions in 2013"  (06/25/2013)  . Headache(784.0)     "very frequent recently; don't usually have headaches at all" (06/25/2013)  . Arthritis     "very bad; knees and hands" (06/25/2013)  . Chronic lower back pain   . Colon cancer 2012    "started chemo 12/23/2010" (06/25/2013)  . Multinodular goiter   . Colon polyps   . CHF (congestive heart failure)   . Glaucoma   . Type II diabetes mellitus   . Diabetic peripheral neuropathy   . Falls infrequently   . Polyneuropathy in diabetes(357.2)      Past Surgical History  Procedure Laterality Date  . Roux-en-y gastric bypass  2009  . Cardiac catheterization  2006    Never had PCI, 3 caths total. Last one in [place]  . Cholecystectomy  1980's  . Umbilical hernia repair  1970's?     (06/25/2013)  . Ulnar tunnel release Left ~ 2008  . Cataract extraction, bilateral Bilateral 2013    "and put stent in my left eye for glaucoma" (06/25/2013)  . Eye muscle surgery Bilateral ~ 1963    "muscles too long; eyes would go out and up; tied muscles to hold my eyes straight" (06/25/2013)  . Portacath placement Left 11/2010  . Colectomy  10/2010    Tumor removal  . Pacemaker insertion  01/02/2014    STJ Assurity dual chamber pacemaker implnated by Dr Lovena Le for SSS  . Lead revision  01-03-2014    atrial lead revision by Dr Rayann Heman  . Permanent pacemaker insertion N/A 01/02/2014    Procedure: PERMANENT PACEMAKER INSERTION;  Surgeon: Evans Lance, MD;  Location: [facility] CATH LAB;  Service: Cardiovascular;  Laterality: N/A;  . Lead revision N/A 01/03/2014    Procedure: LEAD REVISION;  Surgeon: Coralyn Mark, MD;  Location: [facility] CATH LAB;  Service: Cardiovascular;  Laterality: N/A;      Prior to Admission medications   Medication Sig Start Date End Date Taking? Authorizing Provider  atorvastatin (LIPITOR) 80 MG tablet Take 1 tablet (80 mg total) by mouth daily. 09/18/14  Yes Belva Crome, MD  bimatoprost (LUMIGAN) 0.01 % SOLN Place 1 drop into the right eye at bedtime. Right  eye only   Yes Historical Provider, MD  buPROPion (WELLBUTRIN SR) 150 MG 12 hr tablet Take 1 tablet (150 mg total) by mouth every morning. 12/26/14  Yes Ladell Pier, MD  Calcium Citrate-Vitamin D (CITRACAL + D PO) Take 1 tablet by mouth 2 (two) times daily.   Yes Historical Provider, MD  Choline Fenofibrate (FENOFIBRIC ACID) 135 MG CPDR TAKE ONE CAPSULE BY MOUTH EVERY NIGHT AT BEDTIME 07/11/14  Yes Belva Crome, MD  ELIQUIS 5 MG TABS tablet take 1 tablet by mouth twice a day 09/25/14  Yes [name], MD  gabapentin (NEURONTIN) 300 MG capsule Take 300 mg by mouth 2 (two) times daily.    Yes Historical Provider, MD  insulin glargine (LANTUS) 100 UNIT/ML injection Inject 20 Units into the skin at bedtime.   Yes Historical Provider, MD  levothyroxine (SYNTHROID, LEVOTHROID) 125 MCG tablet Take 125 mcg by mouth daily before breakfast.   Yes Historical Provider, MD  lisinopril-hydrochlorothiazide (PRINZIDE,ZESTORETIC) 20-12.5 MG per tablet TAKE 2 TABLETs BY MOUTH EVERY EVENING 12/27/13  Yes [name], MD  metFORMIN (GLUCOPHAGE) 1000  MG tablet Take 1,000 mg by mouth 2 (two) times daily with a meal.    Yes Historical Provider, MD  nitroGLYCERIN (NITROSTAT) 0.4 MG SL tablet Place 1 tablet (0.4 mg total) under the tongue every 5 (five) minutes as needed for chest pain. 01/13/15  Yes [name], MD      Allergies: Allergies  Allergen Reactions  . Other Other (See Comments)    NO Blind Scopes with Naso Gastric tube.  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 Adhesive [Tape]     Tape - burns  . Aspirin     S/P gastric bypass surgery, states her MD told her to not take Aspirin.  . Latex     Tape= burns  . Nsaids     S/P gastric bypass-told not to take. GI bleeds with NSAIDS     Social History:  The patient  reports that she quit smoking about 20 months ago. Her smoking use included Cigarettes. She has a 30 pack-year smoking history. She has never used smokeless tobacco. She reports that she drinks  alcohol. She reports that she does not use illicit drugs.   Family History:  The patient's family history includes Bladder Cancer in her cousin; Breast cancer in her maternal aunt, mother, and sister; Clotting disorder in her sister; Colon polyps in her brother; Diabetes in her maternal grandmother, mother, and sister; Heart attack in her brother, father, and sister; Heart disease in her father; Pancreatic cancer in her brother; Prostate cancer in her brother.   ROS:  Please see the history of present illness.  She has what appears to be a spider bite on her right forearm.  She denies fever. She denies wheezing or vomiting. She denies melena or hematochezia. All other systems reviewed and negative.   Vital Signs: Blood pressure 120/89, pulse 73, temperature 97.5 F (36.4 C), temperature source Oral, resp. rate 24, height 5\' 7"  (1.702 m), weight 245 lb (111.131 kg), SpO2 99 %.  PHYSICAL EXAM: General:  Well nourished, well developed, in no acute distress  HEENT: normal Lymph: no adenopathy Neck: no JVD Endocrine:  No thryomegaly Vascular: No carotid bruits Cardiac:  normal S1, S2; RRR; no murmur Lungs:  clear to auscultation bilaterally, no wheezing, rhonchi or rales Abd: soft, nontender, no hepatomegaly Ext: Trace-1+ bilateral LE edema Musculoskeletal:  No deformities Skin: warm and dry Neuro:  CNs 2-12 intact, no focal abnormalities noted Psych:  Normal affect    EKG:  Underlying sinus rhythm, ventricular paced, HR 60  Labs:   Lab Results  Component Value Date   WBC 7.1 01/27/2015   HGB 13.6 01/27/2015   HCT 40.0 01/27/2015   MCV 89.2 01/27/2015   PLT 306 01/27/2015      Recent Labs Lab 01/27/15 1011 01/27/15 1300  NA 135 137  K 4.5 4.9  CL 102 101  CO2 25  --   BUN 27* 28*  CREATININE 1.25* 1.30*  CALCIUM 8.4*  --   PROT 6.6  --   BILITOT 0.4  --   ALKPHOS 48  --   ALT 12*  --   AST 15  --   GLUCOSE 226* 230*     Recent Labs  01/27/15 1011  TROPONINI  <0.03    No results found for: CHOL, HDL, LDLCALC, TRIG  Lab Results  Component Value Date   DDIMER 0.30 12/31/2014     Radiology/Studies:   Dg Chest 2 View  01/27/2015   CLINICAL DATA:  Headache, dizzy, chest pressure  this morning, chest pain  EXAM: CHEST  2 VIEW  COMPARISON:  12/31/2014  FINDINGS: Cardiomediastinal silhouette is stable. No acute infiltrate or pleural effusion. No pulmonary edema. Dual lead cardiac pacemaker is unchanged in position. Atherosclerotic calcifications of thoracic aorta. Again noted surgical clips in left upper quadrant of the abdomen. Bony thorax is unremarkable.  IMPRESSION: No active cardiopulmonary disease.   Electronically Signed   By: Lahoma Crocker M.D.   On: 01/27/2015 11:16   Ct Head Wo Contrast  01/27/2015   CLINICAL DATA:  Dizziness and headache.  EXAM: CT HEAD WITHOUT CONTRAST  TECHNIQUE: Contiguous axial images were obtained from the base of the skull through the vertex without intravenous contrast.  COMPARISON:  None.  FINDINGS: Mild low attenuation within the subcortical and periventricular white matter is identified consistent with chronic microvascular disease. There is prominence of the sulci and ventricles consistent with brain atrophy. No abnormal extra-axial fluid collections, intracranial hemorrhage or mass noted. The paranasal sinuses are clear. The mastoid air cells are clear. The calvarium appears intact.  IMPRESSION: 1. No acute intracranial abnormalities identified. 2. Mild chronic small vessel ischemic disease and brain atrophy.   Electronically Signed   By: Kerby Moors M.D.   On: 01/27/2015 10:43      ASSESSMENT AND PLAN:   1.  Chest Pain:  Patient presents with symptom complex of chest pressure, dyspnea, dizziness/spinning, nausea, near syncope.  She had a recent stress test with multiple perfusion defects with small area of apical scar, small area of partially reversible inferobasal defect and moderate sized area of moderate anterolateral  wall ischemia with an EF of 55-65%. She has a prior history of cardiac catheterization many years ago with nonobstructive CAD. Symptoms are concerning for unstable angina.  After seeing Dr. Rayann Heman recently, the plan was for her to follow-up with Dr. [name] in a couple of weeks to review further. Initially, conservative management was planned. The patient has a ventricular paced ECG. Chest x-ray is unremarkable. Head CT is also unremarkable. Initial troponin is negative. The patient was also evaluated by Dr. Mare Ferrari.   -  She will be admitted to telemetry. Cardiac enzymes will be cycled.   -  She last took a dose of Eliquis last night. We will transition her to IV heparin.   -  We have recommended proceeding with cardiac cath to further evaluate her symptoms.   -  Metformin will be held.   -  She does have an elevated creatinine. Hold HCTZ.  She will need to be gently hydrated precatheterization. 2.  Coronary Artery Disease: As noted, recent stress test was abnormal. Symptom complex is concerning for unstable angina.   -  She would be admitted to telemetry.   -  Serial markers will be obtained.   -  She will be continued on high-dose statin, ACE inhibitor. Eliquis will be held. She will be placed on IV heparin.   -  Plan cardiac catheterization 01/29/15 (schedule is full 5/31 already).   -  Obtain echocardiogram. 3.  Dizziness: Pacemaker was interrogated today. It is functioning appropriately. She had one episode of atrial fibrillation on May 23. There were no abnormal rhythms to correspond with her symptoms today.  Head CT was negative. She cannot have an MRI secondary to pacemaker.   -  We will obtain carotid Dopplers.   -  If cardiac cath is negative for significant obstructive CAD, consider neurology evaluation. 4.  Diabetes: Hold metformin. Cover with sliding scale insulin. Continue  Lantus. 5.  Hypertension: Controlled. Continue current regimen. Hold HCTZ in preparation for cardiac  catheterization.   6.  Hyperlipidemia: Continue statin.  7.  Hypothyroidism: Continue Synthroid. Check TSH.   8.  Paroxysmal Atrial Fibrillation:  No AF since 5/23.  She is anticoagulated with Eliquis.  This will be held for [facility]. Last dose was last night.  Will ask pharmacy to transition to IV Heparin.   Danton Sewer, PA-C 01/27/2015 3:35 PM  Pager # [phone]  Patient was seen in the emergency room.  She is now pain-free.  She had 2 episodes of severe chest pressure early this morning.  These episodes were associated with nausea and retching but no significant emesis.  There was radiation of her discomfort across her neck and draping down across her upper chest like a horse collar.  With the second episode of chest discomfort she was able to get herself across the room to where her nitroglycerin's were kept and she took 2 nitroglycerin's and noted apparent relief of pain after the second nitroglycerin.  Her EKG shows a paced ventricular rhythm and is not helpful.  Her initial troponin is normal.  With today's episode of severe chest discomfort and her recent Myoview showing a moderate area of anterolateral wall ischemia, I agree with plans for left heart cardiac catheterization.  This will be done on Wednesday because the catheter schedule is already full for tomorrow.  Patient understands.  Addendum: Patient notes she cannot take ASA due to hx of gastric bypass and prior upper GI bleeding. Will hold off on starting ASA.  Will have to consider Plavix only if PCI needed. Richardson Dopp, PA-C   01/27/2015 4:15 PM

## 2015-01-27 NOTE — ED Notes (Addendum)
Woke up at 0500 with headache, got up felt dizzy, nauseated, and chest felt pressure. States felt like she had something heavy hanging around her neck. Lasted a short time, then heaviness went away, but still felt dizzy, nauseated, and headache worse. BP at home was 168/88  . Took nitro x 2. Feeling of near syncope continued so woke husband up at 0800 to come to hospital. No LOC. Denies CP on arrival, reports headache is dull now. Reports she drank a glass of wine last night and took a benadryl last night for the first time in a year. Also reports a positive stress test a few weeks ago, supposed to follow up with cardiology.

## 2015-01-27 NOTE — ED Notes (Signed)
Patient transported to Radiology 

## 2015-01-27 NOTE — ED Notes (Signed)
[employer] Rep at bedside.

## 2015-01-27 NOTE — ED Notes (Signed)
Patient returned from CT

## 2015-01-27 NOTE — ED Notes (Signed)
Patient returned from X-ray 

## 2015-01-27 NOTE — Progress Notes (Signed)
ANTICOAGULATION CONSULT NOTE - Initial Consult  Pharmacy Consult for heparin Indication: ACS/STEMI  Allergies  Allergen Reactions  . Other Other (See Comments)    NO Blind Scopes with Naso Gastric tube.  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 Adhesive [Tape]     Tape - burns  . Aspirin     S/P gastric bypass surgery, states her MD told her to not take Aspirin.  . Latex     Tape= burns  . Nsaids     S/P gastric bypass-told not to take. GI bleeds with NSAIDS    Patient Measurements: Height: 5\' 7"  (170.2 cm) Weight: 245 lb (111.131 kg) IBW/kg (Calculated) : 61.6 Heparin Dosing Weight: 87kg  Vital Signs: Temp: 97.5 F (36.4 C) (05/30 0919) Temp Source: Oral (05/30 0919) BP: 116/41 mmHg (05/30 1630) Pulse Rate: 68 (05/30 1630)  Labs:  Recent Labs  01/27/15 1011 01/27/15 1300  HGB 12.1 13.6  HCT 37.3 40.0  PLT 306  --   CREATININE 1.25* 1.30*  TROPONINI <0.03  --     Estimated Creatinine Clearance: 49.5 mL/min (by C-G formula based on Cr of 1.3).   Medical History: Past Medical History  Diagnosis Date  . Hypertension   . CAD (coronary artery disease)     non obstructive  . Hyperlipidemia   . Hypothyroidism   . DVT (deep venous thrombosis) 2013    "twice behind knee on left side" (06/25/2013)  . Foot fracture 2013    "left; fell; never had OR; just wrapped good and wore brace" (06/25/2013)  . Sleep apnea     "cleared up after I lost 134# w/gastric bypass" (06/25/2013)  . Iron deficiency anemia     "had 3 iron infusions in 2013" (06/25/2013)  . Headache(784.0)     "very frequent recently; don't usually have headaches at all" (06/25/2013)  . Arthritis     "very bad; knees and hands" (06/25/2013)  . Chronic lower back pain   . Colon cancer 2012    "started chemo 12/23/2010" (06/25/2013)  . Multinodular goiter   . Colon polyps   . CHF (congestive heart failure)   . Glaucoma   . Type II diabetes mellitus   . Diabetic peripheral neuropathy   . Falls  infrequently   . Polyneuropathy in diabetes(357.2)     Assessment: 43 yof with nonobstructive CAD hx to ED with CP, SOB. Pharmacy consulted to transition to IV heparin from pta Eliquis (last dose 5/29 PM). Cath scheduled for 6/1. CBC wnl. No bleed documented.  Goal of Therapy:  Heparin level 0.3-0.7 units/ml Monitor platelets by anticoagulation protocol: Yes   Plan:  Start heparin IV @1400  units/hr 8h HL/aPTT - baseline levels ordered  Daily HL/aPTT  Mon s/sx bleed  Cath scheduled 6/1   Elicia Lamp, PharmD Clinical Pharmacist - Resident Pager [phone] 01/27/2015 5:09 PM

## 2015-01-27 NOTE — ED Provider Notes (Signed)
CSN: [id number]     Arrival date & time 01/27/15  0904 History   First MD Initiated Contact with Patient 01/27/15 [id number]     Chief Complaint  Patient presents with  . Headache  . Chest Pain     (Consider location/radiation/quality/duration/timing/severity/associated sxs/prior Treatment) HPI  74 year old female presents with 2 episodes of near syncope this morning. She woke up this morning around 5 AM with a mild headache but around 5:30 (she is not completely sure of the time) she developed a severe 10/10 headache associated with blurry vision, chest pain (like a balloon inflating) as well as with nausea without vomiting. The patient states this happened to her about 4 weeks ago as well and she came to the ER then. This seemed to improve although she's not sure how long it lasted. She states it was like she couldn't move her entire body and couldn't speak. She felt like she's about to pass out. Patient checked her blood pressure and was at least 741+ systolic but cannot exact number. The pain seemed to improve and then came back and she had the same severe headache associated with chest pain, blurry vision, and nausea. She took 2 nitroglycerin which seemed to improve her symptoms. The patient states that she is supposed to be following up with a cardiologist because she had an abnormal stress test 2 weeks ago. The patient currently states she feels much better and does not feel near syncopal and only has a mild aching headache. She declines pain medicine at this time. No further nausea.   Past Medical History  Diagnosis Date  . Hypertension   . CAD (coronary artery disease)     non obstructive  . Hyperlipidemia   . Hypothyroidism   . DVT (deep venous thrombosis) 2013    "twice behind knee on left side" (06/25/2013)  . Foot fracture 2013    "left; fell; never had OR; just wrapped good and wore brace" (06/25/2013)  . Sleep apnea     "cleared up after I lost 134# w/gastric bypass" (06/25/2013)   . Iron deficiency anemia     "had 3 iron infusions in 2013" (06/25/2013)  . Headache(784.0)     "very frequent recently; don't usually have headaches at all" (06/25/2013)  . Arthritis     "very bad; knees and hands" (06/25/2013)  . Chronic lower back pain   . Colon cancer 2012    "started chemo 12/23/2010" (06/25/2013)  . Multinodular goiter   . Colon polyps   . CHF (congestive heart failure)   . Glaucoma   . Type II diabetes mellitus   . Diabetic peripheral neuropathy   . Falls infrequently   . Polyneuropathy in diabetes(357.2)    Past Surgical History  Procedure Laterality Date  . Roux-en-y gastric bypass  2009  . Cardiac catheterization  2006    Never had PCI, 3 caths total. Last one in [place]  . Cholecystectomy  1980's  . Umbilical hernia repair  1970's?     (06/25/2013)  . Ulnar tunnel release Left ~ 2008  . Cataract extraction, bilateral Bilateral 2013    "and put stent in my left eye for glaucoma" (06/25/2013)  . Eye muscle surgery Bilateral ~ 1963    "muscles too long; eyes would go out and up; tied muscles to hold my eyes straight" (06/25/2013)  . Portacath placement Left 11/2010  . Colectomy  10/2010    Tumor removal  . Pacemaker insertion  01/02/2014    STJ Assurity dual chamber  pacemaker implnated by Dr Lovena Le for SSS  . Lead revision  01-03-2014    atrial lead revision by Dr Rayann Heman  . Permanent pacemaker insertion N/A 01/02/2014    Procedure: PERMANENT PACEMAKER INSERTION;  Surgeon: Evans Lance, MD;  Location: [facility] CATH LAB;  Service: Cardiovascular;  Laterality: N/A;  . Lead revision N/A 01/03/2014    Procedure: LEAD REVISION;  Surgeon: Coralyn Mark, MD;  Location: [facility] CATH LAB;  Service: Cardiovascular;  Laterality: N/A;   Family History  Problem Relation Age of Onset  . Breast cancer Mother   . Heart disease Father   . Heart attack Father   . Heart attack Sister   . Heart attack Brother   . Breast cancer Sister   . Breast cancer Maternal Aunt     x 2  aunts  . Pancreatic cancer Brother   . Prostate cancer Brother   . Colon polyps Brother   . Bladder Cancer Cousin   . Clotting disorder Sister   . Diabetes Sister     x 3  . Diabetes Mother   . Diabetes Maternal Grandmother    History  Substance Use Topics  . Smoking status: Former Smoker -- 1.00 packs/day for 30 years    Types: Cigarettes    Quit date: 05/03/2013  . Smokeless tobacco: Never Used     Comment: 06/25/2013 "smoked off and on since I was 22; stopped 10/2007 til 08/30/2012 then started again cause of stress"  . Alcohol Use: Yes     Comment: 06/25/2013 "glass of wine 1-2 times/yr"   OB History    No data available     Review of Systems  Constitutional: Negative for fever.  Eyes: Positive for visual disturbance.  Respiratory: Negative for shortness of breath.   Cardiovascular: Positive for chest pain.  Gastrointestinal: Positive for nausea.  Neurological: Positive for headaches. Negative for weakness and numbness.  All other systems reviewed and are negative.      Allergies  Other; Adhesive; Aspirin; Latex; and Nsaids  Home Medications   Prior to Admission medications   Medication Sig Start Date End Date Taking? Authorizing Provider  atorvastatin (LIPITOR) 80 MG tablet Take 1 tablet (80 mg total) by mouth daily. 09/18/14   Belva Crome, MD  bimatoprost (LUMIGAN) 0.01 % SOLN Place 1 drop into the right eye at bedtime. Right eye only    Historical Provider, MD  buPROPion (WELLBUTRIN SR) 150 MG 12 hr tablet Take 1 tablet (150 mg total) by mouth every morning. 12/26/14   Ladell Pier, MD  Calcium Citrate-Vitamin D (CITRACAL + D PO) Take 1 tablet by mouth 2 (two) times daily.    Historical Provider, MD  Choline Fenofibrate (FENOFIBRIC ACID) 135 MG CPDR TAKE ONE CAPSULE BY MOUTH EVERY NIGHT AT BEDTIME 07/11/14   Belva Crome, MD  cyanocobalamin (,VITAMIN B-12,) 1000 MCG/ML injection Inject 1,000 mcg into the skin every 30 (thirty) days.    Historical Provider, MD   ELIQUIS 5 MG TABS tablet take 1 tablet by mouth twice a day 09/25/14   [name], MD  gabapentin (NEURONTIN) 300 MG capsule Take 300 mg by mouth 2 (two) times daily.     Historical Provider, MD  HYDROcodone-acetaminophen (NORCO/VICODIN) 5-325 MG per tablet Take 1 tablet by mouth every 6 (six) hours as needed for moderate pain. 11/21/14   Ladell Pier, MD  insulin glargine (LANTUS) 100 UNIT/ML injection Inject 20 Units into the skin at bedtime.    Historical Provider,  MD  levothyroxine (SYNTHROID, LEVOTHROID) 125 MCG tablet Take 125 mcg by mouth daily before breakfast.    Historical Provider, MD  lisinopril-hydrochlorothiazide (PRINZIDE,ZESTORETIC) 20-12.5 MG per tablet TAKE 2 TABLETs BY MOUTH EVERY EVENING 12/27/13   [name], MD  metFORMIN (GLUCOPHAGE) 1000 MG tablet Take 1,000 mg by mouth 2 (two) times daily with a meal.     Historical Provider, MD  nitroGLYCERIN (NITROSTAT) 0.4 MG SL tablet Place 1 tablet (0.4 mg total) under the tongue every 5 (five) minutes as needed for chest pain. 01/13/15   [name], MD   BP 122/51 mmHg  Pulse 60  Temp(Src) 97.5 F (36.4 C) (Oral)  Resp 16  Ht 5\' 7"  (1.702 m)  Wt 245 lb (111.131 kg)  BMI 38.36 kg/m2  SpO2 97% Physical Exam  Constitutional: She is oriented to person, place, and time. She appears well-developed and well-nourished.  HENT:  Head: Normocephalic and atraumatic.  Right Ear: External ear normal.  Left Ear: External ear normal.  Nose: Nose normal.  Eyes: EOM are normal. Pupils are equal, round, and reactive to light. Right eye exhibits no discharge. Left eye exhibits no discharge.  Neck: Normal range of motion. Neck supple.  No meningismus  Cardiovascular: Normal rate, regular rhythm and normal heart sounds.   Pulmonary/Chest: Effort normal and breath sounds normal.  Abdominal: Soft. She exhibits no distension. There is no tenderness.  Musculoskeletal: She exhibits edema (bilateral pitting edema to lower extremities).   Neurological: She is alert and oriented to person, place, and time.  CN 2-12 grossly intact. 5/5 strength in all 4 extremities.  Skin: Skin is warm and dry.  Nursing note and vitals reviewed.   ED Course  Procedures (including critical care time) Labs Review Labs Reviewed  COMPREHENSIVE METABOLIC PANEL - Abnormal; Notable for the following:    Glucose, Bld 226 (*)    BUN 27 (*)    Creatinine, Ser 1.25 (*)    Calcium 8.4 (*)    ALT 12 (*)    GFR calc non Af Amer 42 (*)    GFR calc Af Amer 48 (*)    All other components within normal limits  I-STAT CHEM 8, ED - Abnormal; Notable for the following:    BUN 28 (*)    Creatinine, Ser 1.30 (*)    Glucose, Bld 230 (*)    All other components within normal limits  TROPONIN I  CBC WITH DIFFERENTIAL/PLATELET  BRAIN NATRIURETIC PEPTIDE  I-STAT CHEM 8, ED    Imaging Review Dg Chest 2 View  01/27/2015   CLINICAL DATA:  Headache, dizzy, chest pressure this morning, chest pain  EXAM: CHEST  2 VIEW  COMPARISON:  12/31/2014  FINDINGS: Cardiomediastinal silhouette is stable. No acute infiltrate or pleural effusion. No pulmonary edema. Dual lead cardiac pacemaker is unchanged in position. Atherosclerotic calcifications of thoracic aorta. Again noted surgical clips in left upper quadrant of the abdomen. Bony thorax is unremarkable.  IMPRESSION: No active cardiopulmonary disease.   Electronically Signed   By: Lahoma Crocker M.D.   On: 01/27/2015 11:16   Ct Head Wo Contrast  01/27/2015   CLINICAL DATA:  Dizziness and headache.  EXAM: CT HEAD WITHOUT CONTRAST  TECHNIQUE: Contiguous axial images were obtained from the base of the skull through the vertex without intravenous contrast.  COMPARISON:  None.  FINDINGS: Mild low attenuation within the subcortical and periventricular white matter is identified consistent with chronic microvascular disease. There is prominence of the sulci and ventricles consistent with  brain atrophy. No abnormal extra-axial fluid  collections, intracranial hemorrhage or mass noted. The paranasal sinuses are clear. The mastoid air cells are clear. The calvarium appears intact.  IMPRESSION: 1. No acute intracranial abnormalities identified. 2. Mild chronic small vessel ischemic disease and brain atrophy.   Electronically Signed   By: Kerby Moors M.D.   On: 01/27/2015 10:43     EKG Interpretation   Date/Time:  Monday Jan 27 2015 09:18:29 EDT Ventricular Rate:  60 PR Interval:    QRS Duration: 175 QT Interval:  503 QTC Calculation: 503 R Axis:   -75 Text Interpretation:  Paced rhythm Left bundle branch block no significant  change since Dec 31 2014 Confirmed by Regenia Skeeter  MD, [place] ([id number]) on  01/27/2015 10:36:51 AM      MDM   Final diagnoses:  Other chest pain    Patient is currently asymptomatic but did have 2 episodes that were most concerning for hypertension related symptoms given that she had headache, visual complaints and chest complaints. Has already had an abnormal stress test and while her initial troponin is negative, she will need observation admission with cardiology for further workup and treatment. Currently BP is stable and symptoms are gone.    Sherwood Gambler, MD 01/27/15 [phone]

## 2015-01-27 NOTE — ED Notes (Signed)
Representative from [place] coming to interrogate pacemaker.

## 2015-01-28 ENCOUNTER — Telehealth: Payer: Self-pay

## 2015-01-28 ENCOUNTER — Inpatient Hospital Stay (HOSPITAL_COMMUNITY): Payer: Medicare Other

## 2015-01-28 ENCOUNTER — Ambulatory Visit (HOSPITAL_COMMUNITY): Payer: Medicare Other

## 2015-01-28 ENCOUNTER — Other Ambulatory Visit: Payer: Self-pay | Admitting: Physician Assistant

## 2015-01-28 DIAGNOSIS — G464 Cerebellar stroke syndrome: Secondary | ICD-10-CM

## 2015-01-28 DIAGNOSIS — E1142 Type 2 diabetes mellitus with diabetic polyneuropathy: Secondary | ICD-10-CM | POA: Diagnosis not present

## 2015-01-28 DIAGNOSIS — R0789 Other chest pain: Secondary | ICD-10-CM | POA: Diagnosis not present

## 2015-01-28 DIAGNOSIS — I10 Essential (primary) hypertension: Secondary | ICD-10-CM | POA: Diagnosis not present

## 2015-01-28 DIAGNOSIS — E039 Hypothyroidism, unspecified: Secondary | ICD-10-CM | POA: Diagnosis not present

## 2015-01-28 DIAGNOSIS — I200 Unstable angina: Secondary | ICD-10-CM | POA: Diagnosis not present

## 2015-01-28 DIAGNOSIS — I6782 Cerebral ischemia: Secondary | ICD-10-CM | POA: Diagnosis not present

## 2015-01-28 DIAGNOSIS — I480 Paroxysmal atrial fibrillation: Secondary | ICD-10-CM | POA: Diagnosis not present

## 2015-01-28 DIAGNOSIS — I25110 Atherosclerotic heart disease of native coronary artery with unstable angina pectoris: Secondary | ICD-10-CM | POA: Diagnosis not present

## 2015-01-28 LAB — LIPID PANEL
Cholesterol: 183 mg/dL (ref 0–200)
HDL: 43 mg/dL (ref >40)
LDL CALC: 114 mg/dL — AB (ref 0–99)
TRIGLYCERIDES: 128 mg/dL (ref <150)
Total CHOL/HDL Ratio: 4.3 RATIO
VLDL: 26 mg/dL (ref 0–40)

## 2015-01-28 LAB — TROPONIN I: Troponin I: <0.03 ng/mL (ref <0.031)

## 2015-01-28 LAB — CBC
HCT: 37.5 % (ref 36.0–46.0)
HEMOGLOBIN: 12 g/dL (ref 12.0–15.0)
MCH: 28.6 pg (ref 26.0–34.0)
MCHC: 32 g/dL (ref 30.0–36.0)
MCV: 89.3 fL (ref 78.0–100.0)
Platelets: 303 10*3/uL (ref 150–400)
RBC: 4.2 MIL/uL (ref 3.87–5.11)
RDW: 13.6 % (ref 11.5–15.5)
WBC: 8.3 10*3/uL (ref 4.0–10.5)

## 2015-01-28 LAB — HEMOGLOBIN A1C
HEMOGLOBIN A1C: 9 % — AB (ref 4.8–5.6)
Mean Plasma Glucose: 212 mg/dL

## 2015-01-28 LAB — BASIC METABOLIC PANEL
ANION GAP: 8 (ref 5–15)
BUN: 24 mg/dL — ABNORMAL HIGH (ref 6–20)
CALCIUM: 8.5 mg/dL — AB (ref 8.9–10.3)
CO2: 24 mmol/L (ref 22–32)
CREATININE: 1.09 mg/dL — AB (ref 0.44–1.00)
Chloride: 100 mmol/L — ABNORMAL LOW (ref 101–111)
GFR calc Af Amer: 57 mL/min — ABNORMAL LOW (ref >60)
GFR calc non Af Amer: 49 mL/min — ABNORMAL LOW (ref >60)
GLUCOSE: 160 mg/dL — AB (ref 65–99)
Potassium: 4.3 mmol/L (ref 3.5–5.1)
SODIUM: 132 mmol/L — AB (ref 135–145)

## 2015-01-28 LAB — HEPARIN LEVEL (UNFRACTIONATED): HEPARIN UNFRACTIONATED: 0.75 [IU]/mL — AB (ref 0.30–0.70)

## 2015-01-28 LAB — GLUCOSE, CAPILLARY
GLUCOSE-CAPILLARY: 199 mg/dL — AB (ref 65–99)
Glucose-Capillary: 167 mg/dL — ABNORMAL HIGH (ref 65–99)

## 2015-01-28 LAB — APTT: aPTT: 59 seconds — ABNORMAL HIGH (ref 24–37)

## 2015-01-28 MED ORDER — APIXABAN 5 MG PO TABS
5.0000 mg | ORAL_TABLET | Freq: Two times a day (BID) | ORAL | Status: DC
  Administered 2015-01-28: 5 mg via ORAL
  Filled 2015-01-28 (×2): qty 1

## 2015-01-28 NOTE — Care Management (Signed)
Medicare Important Message given? Yes (If Response is "NO", the following Medicare IM given date fields will be blank) Date Medicare IM given: 01/28/15 Medicare IM given by: Elenor Quinones

## 2015-01-28 NOTE — Progress Notes (Signed)
[place] for heparin Indication: ACS/STEMI  Allergies  Allergen Reactions  . Other Other (See Comments)    NO Blind Scopes with Naso Gastric tube.  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 Adhesive [Tape]     Tape - burns  . Aspirin     S/P gastric bypass surgery, states her MD told her to not take Aspirin.  . Latex     Tape= burns  . Nsaids     S/P gastric bypass-told not to take. GI bleeds with NSAIDS    Patient Measurements: Height: 5\' 7"  (170.2 cm) Weight: 246 lb 9.6 oz (111.857 kg) IBW/kg (Calculated) : 61.6 Heparin Dosing Weight: 87kg  Vital Signs: Temp: 98 F (36.7 C) (05/30 2018) Temp Source: Oral (05/30 2018) BP: 109/42 mmHg (05/30 2018) Pulse Rate: 76 (05/30 2018)  Labs:  Recent Labs  01/27/15 1011 01/27/15 1300 01/27/15 1911 01/27/15 2303 01/28/15 0113  HGB 12.1 13.6  --   --   --   HCT 37.3 40.0  --   --   --   PLT 306  --   --   --   --   APTT  --   --  35  --  59*  HEPARINUNFRC  --   --  0.65  --  0.75*  CREATININE 1.25* 1.30*  --   --   --   TROPONINI <0.03  --  <0.03 <0.03  --     Estimated Creatinine Clearance: 49.7 mL/min (by C-G formula based on Cr of 1.3).  Assessment: 74 y.o. female with chest pain for heparin   Goal of Therapy:  APTT 66-102 Heparin level 0.3-0.7 units/ml Monitor platelets by anticoagulation protocol: Yes   Plan:  Increase Heparin 1550 units/hr Check PTT in 8 hrs  Phillis Knack, PharmD, BCPS  01/28/2015 2:41 AM

## 2015-01-28 NOTE — Progress Notes (Signed)
UR Completed. Antonisha Waskey, RN, BSN.  [phone] 

## 2015-01-28 NOTE — Discharge Summary (Signed)
Physician Discharge Summary      Cardiologist:  [name] Patient ID: Mary [name] MRN: [id number] DOB/AGE: 04-17-1941 74 y.o.  Admit date: 01/27/2015 Discharge date: 01/28/2015  Admission Diagnoses:   Unstable angina  Discharge Diagnoses:  Principal Problem:   Unstable angina Active Problems:   CAD (coronary artery disease)   Diabetes mellitus with complication   Hypothyroidism   Hypertension   Hyperlipidemia   PAF (paroxysmal atrial fibrillation)   Cardiac pacemaker in situ   Dizziness   Discharged Condition: stable  Hospital Course:   Mary [name] is a 74 y.o. female nonobstructive CAD by remote cardiac catheterization, PAF, diabetes, HTN, HL, hypothyroidism, colon CA, prior DVT, prior gastric bypass surgery. Patient moved back to the area from [place] in 2014. She underwent implantation of a dual-chamber pacemaker in 12/2013 secondary to symptomatic bradycardia with complete heart block. She is on Eliquis for anticoagulation. Patient recently presented to the emergency room with chest pain. She was set up for a stress test as an outpatient. This was performed 01/09/15 and was intermediate risk with evidence of prior infarct with peri-infarct ischemia, EF 55-65%. There was a moderate area of anterolateral wall ischemia. The patient was seen by Dr. Rayann Heman in the office. He reviewed the patient's nuclear study with Dr. [name]. Conservative management was recommended. The patient was to follow-up with Dr. [name] 02/07/15 for follow-up.  She presented to the emergency room after an episode of chest pain, shortness of breath, nausea, dizziness, chills accompanied by headache occurring this morning shortly after awakening. She had a similar episode several weeks ago and reported to the emergency room. This prompted her outpatient stress test. Her symptoms began suddenly. It started with a headache. She felt quite weak with this and describes chest pressure, dyspnea as well as neck pressure and  heaviness.  She also describes a spinning sensation. She was unable to speak but denied unilateral weakness or facial droop. She denied vomiting. She did feel as though she might pass out.  She denied frank syncope.  She took her husband's nitroglycerin 2 with relief. She is fairly sedentary.  She has significant back and knee problems. She has not felt well for about 6 months. She is not able to exert herself significantly. She denied orthopnea, PND.  She has chronic pedal edema without significant change. She has recently started back to smoking. She noted a chronic nonproductive cough without significant change.  She was admitted and ruled out for MI.  Eliquis was stopped and heparin started.  She was scheduled for a left heart cath but was adamant  About no one doing it but Dr. [name] who is not in the cath lab until next Tuesday.   She will resume eliquis today and stop on Sunday for cath Tuesday.  Pacemaker was interrogated and functioning appropriately.  She had one episode of atrial fibrillation on May 23. There were no abnormal rhythms to correspond with her symptoms  The patient was seen by Dr. Burt Knack who felt she was stable for DC home.   Consults: None  Significant Diagnostic Studies:   CT HEAD WITHOUT CONTRAST  TECHNIQUE: Contiguous axial images were obtained from the base of the skull through the vertex without intravenous contrast.  COMPARISON: None.  FINDINGS: Mild low attenuation within the subcortical and periventricular white matter is identified consistent with chronic microvascular disease. There is prominence of the sulci and ventricles consistent with brain atrophy. No abnormal extra-axial fluid collections, intracranial hemorrhage or mass noted. The paranasal sinuses  are clear. The mastoid air cells are clear. The calvarium appears intact.  IMPRESSION: 1. No acute intracranial abnormalities identified. 2. Mild chronic small vessel ischemic disease and brain  atrophy.  CHEST 2 VIEW  COMPARISON: 12/31/2014  FINDINGS: Cardiomediastinal silhouette is stable. No acute infiltrate or pleural effusion. No pulmonary edema. Dual lead cardiac pacemaker is unchanged in position. Atherosclerotic calcifications of thoracic aorta. Again noted surgical clips in left upper quadrant of the abdomen. Bony thorax is unremarkable.  IMPRESSION: No active cardiopulmonary disease.   Treatments: See above  Discharge Exam: Blood pressure 120/53, pulse 60, temperature 98.2 F (36.8 C), temperature source Oral, resp. rate 18, height 5\' 7"  (1.702 m), weight 250 lb 14.1 oz (113.8 kg), SpO2 97 %.   Disposition: 01-Home or Self Care      Discharge Instructions    Diet - low sodium heart healthy    Complete by:  As directed      Increase activity slowly    Complete by:  As directed             Medication List    TAKE these medications        atorvastatin 80 MG tablet  Commonly known as:  LIPITOR  Take 1 tablet (80 mg total) by mouth daily.     bimatoprost 0.01 % Soln  Commonly known as:  LUMIGAN  Place 1 drop into the right eye at bedtime. Right eye only     buPROPion 150 MG 12 hr tablet  Commonly known as:  WELLBUTRIN SR  Take 1 tablet (150 mg total) by mouth every morning.     CITRACAL + D PO  Take 1 tablet by mouth 2 (two) times daily.     ELIQUIS 5 MG Tabs tablet  Generic drug:  apixaban  take 1 tablet by mouth twice a day     Fenofibric Acid 135 MG Cpdr  TAKE ONE CAPSULE BY MOUTH EVERY NIGHT AT BEDTIME     gabapentin 300 MG capsule  Commonly known as:  NEURONTIN  Take 300 mg by mouth 2 (two) times daily.     LANTUS 100 UNIT/ML injection  Generic drug:  insulin glargine  Inject 20 Units into the skin at bedtime.     levothyroxine 125 MCG tablet  Commonly known as:  SYNTHROID, LEVOTHROID  Take 125 mcg by mouth daily before breakfast.     lisinopril-hydrochlorothiazide 20-12.5 MG per tablet  Commonly known as:   PRINZIDE,ZESTORETIC  TAKE 2 TABLETs BY MOUTH EVERY EVENING     metFORMIN 1000 MG tablet  Commonly known as:  GLUCOPHAGE  Take 1,000 mg by mouth 2 (two) times daily with a meal.     nitroGLYCERIN 0.4 MG SL tablet  Commonly known as:  NITROSTAT  Place 1 tablet (0.4 mg total) under the tongue every 5 (five) minutes as needed for chest pain.       Follow-up Information    Follow up with Left heart catheterization On 02/04/2015.   Why:  The office will contact you regarding the procedure scheduling.    Contact information:   [place] [address] [phone]     Greater than 30 minutes was spent completing the patient's discharge.    SignedTarri Fuller, [place] 01/28/2015, 12:59 PM

## 2015-01-28 NOTE — Progress Notes (Signed)
Pt D/C home per MD order, D/C instructions review with pt and all querstions answered. Pt aware of  Schedule cath appt next Tuesday & follow up appt. IV removed, site looks clean and intact.

## 2015-01-28 NOTE — Telephone Encounter (Signed)
Message received for Audry Riles to call pt and schedule cardiac cath for 6/7 with Dr.Smith.  Called pt. lmtcb

## 2015-01-28 NOTE — Progress Notes (Signed)
VASCULAR LAB PRELIMINARY  PRELIMINARY  PRELIMINARY  PRELIMINARY  Carotid duplex  completed.    Preliminary report:  Bilateral:  1-39% ICA stenosis.  Right:  Vertebral artery flow is antegrade.  Left:  Vertebral artery retrograde.   Right BP:  140/57  Left BP:  126/49.    Jorryn Hershberger, RVT 01/28/2015, 2:43 PM

## 2015-01-28 NOTE — Progress Notes (Addendum)
Subjective: No further CP, HA or Nausea  Objective: Vital signs in last 24 hours: Temp:  [98 F (36.7 C)-98.6 F (37 C)] 98.4 F (36.9 C) (05/31 0438) Pulse Rate:  [58-76] 58 (05/31 0438) Resp:  [11-24] 18 (05/31 0438) BP: (104-162)/(37-89) 155/54 mmHg (05/31 0438) SpO2:  [93 %-99 %] 97 % (05/31 0438) Weight:  [246 lb 9.6 oz (111.857 kg)-250 lb 14.1 oz (113.8 kg)] 250 lb 14.1 oz (113.8 kg) (05/31 0500) Last BM Date: 01/27/15  Intake/Output from previous day: 05/30 0701 - 05/31 0700 In: 360 [P.O.:360] Out: 2 [Urine:2] Intake/Output this shift:    Medications Scheduled Meds: . atorvastatin  80 mg Oral q1800  . buPROPion  150 mg Oral q morning - 10a  . fenofibrate  160 mg Oral QHS  . gabapentin  300 mg Oral BID  . insulin aspart  0-20 Units Subcutaneous TID WC  . insulin aspart  0-5 Units Subcutaneous QHS  . insulin glargine  20 Units Subcutaneous QHS  . latanoprost  1 drop Right Eye QHS  . levothyroxine  125 mcg Oral QAC breakfast  . lisinopril  20 mg Oral Daily  . sodium chloride  3 mL Intravenous Q12H  . sodium chloride  3 mL Intravenous Q12H   Continuous Infusions: . sodium chloride    . heparin 1,550 Units/hr (01/28/15 0250)   PRN Meds:.sodium chloride, sodium chloride, acetaminophen, nitroGLYCERIN, ondansetron (ZOFRAN) IV, sodium chloride, sodium chloride  PE: General appearance: alert, cooperative and no distress Lungs: clear to auscultation bilaterally Heart: regular rate and rhythm, S1, S2 normal, no murmur, click, rub or gallop Extremities: No LEE Pulses: 2+ and symmetric Skin: Warm and dry Neurologic: Grossly normal  Lab Results:   Recent Labs  01/27/15 1011 01/27/15 1300 01/28/15 0525  WBC 7.1  --  8.3  HGB 12.1 13.6 12.0  HCT 37.3 40.0 37.5  PLT 306  --  303   BMET  Recent Labs  01/27/15 1011 01/27/15 1300 01/28/15 0525  NA 135 137 132*  K 4.5 4.9 4.3  CL 102 101 100*  CO2 25  --  24  GLUCOSE 226* 230* 160*  BUN 27* 28* 24*    CREATININE 1.25* 1.30* 1.09*  CALCIUM 8.4*  --  8.5*   PT/INR No results for input(s): LABPROT, INR in the last 72 hours. Cholesterol  Recent Labs  01/28/15 0525  CHOL 183   Lipid Panel     Component Value Date/Time   CHOL 183 01/28/2015 0525   TRIG 128 01/28/2015 0525   HDL 43 01/28/2015 0525   CHOLHDL 4.3 01/28/2015 0525   VLDL 26 01/28/2015 0525   LDLCALC 114* 01/28/2015 0525   Cardiac Panel (last 3 results)  Recent Labs  01/27/15 1911 01/27/15 2303 01/28/15 0525  TROPONINI <0.03 <0.03 <0.03       Assessment/Plan  74 y.o. female nonobstructive CAD by remote cardiac catheterization, PAF, diabetes, HTN, HL, hypothyroidism, colon CA, prior DVT, prior gastric bypass surgery. Patient moved back to the area from [place] in 2014. She underwent implantation of a dual-chamber pacemaker in 12/2013 secondary to symptomatic bradycardia with complete heart block. She is on Eliquis for anticoagulation.  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 There was a moderate area of anterolateral wall ischemia.   Principal Problem:   Unstable angina Ruled out for MI.  Plan for left heart cath tomorrow at 0830hrs.  IV heparin.  ASA allergy.  Lipitor, fenofibrate.  Recent  stress test: intermediate risk with evidence of prior infarct with peri-infarct ischemia, EF 55-65%. There was a moderate area of anterolateral wall ischemia.    SHE ONLY WANTS DR. [name] TO DO THE CATH.  He is not in the lab all week.       CAD (coronary artery disease)   Diabetes mellitus with complication  On SS insulin. Holding metformin   Hypothyroidism  TSH slightly low.  Check T4   Hypertension  Labile.  Continue to monitor.  ACE-I   Hyperlipidemia  Statin, fenofibrate   PAF (paroxysmal atrial fibrillation)  Eliquis held and on IV heparin.  No AF since 5/23   Cardiac pacemaker in situ  Normally functioning device   Dizziness    LOS: 1 day     HAGER, BRYAN PA-C 01/28/2015 9:57 AM  Patient seen, examined. Available data reviewed. Agree with findings, assessment, and plan as outlined by Tarri Fuller, PA-C. The patient was independently interviewed and examined. Exam reveals a pleasant, alert and oriented woman in no distress. Lungs are clear. Heart is regular rate and rhythm without murmur or gallop. There is trace pretibial edema.  Data reviewed. The patient has ruled out for myocardial infarction. No chest pain at all since admission. I discussed options with her. She is very clear that she does not want to proceed with cardiac catheterization done by anyone except for Dr. [name]. He is back in the cardiac catheterization lab next week. Considering the fact that she has ruled out for MI, I think it is reasonable to honor her with and schedule she understands to return immediately if she has recurrence of chest pain that is refractory to nitroglycerin. I have recommended that she hold beginning Sunday morning so that she takes no Eliquis on Sunday or Monday and cardiac cath can be done Tuesday by Dr [name]. Otherwise continue current Rx. She does have an aspirin allergy listed, but is not truly allergic. She had gastric bypass in 2009 and was told she should never take aspirin or NSAIDs. She also has a history of bleeding. Will hold off on aspirin considering her chronic anticoagulation and issues outlined above. She understands that if she has obstructive coronary disease, risks and benefits of aspirin will have to be reviewed.   Sherren Mocha, M.D. 01/28/2015 11:37 AM

## 2015-01-29 ENCOUNTER — Ambulatory Visit (HOSPITAL_COMMUNITY): Admit: 2015-01-29 | Payer: Self-pay | Admitting: Cardiovascular Disease

## 2015-01-29 ENCOUNTER — Encounter (HOSPITAL_COMMUNITY): Payer: Self-pay

## 2015-01-29 ENCOUNTER — Telehealth: Payer: Self-pay | Admitting: *Deleted

## 2015-01-29 SURGERY — LEFT HEART CATH AND CORONARY ANGIOGRAPHY
Anesthesia: LOCAL

## 2015-01-29 NOTE — Telephone Encounter (Signed)
Pt aware of cardiac cath scheduled on 6/7 @ 7:30am with Dr.Smith. Pt given verbal preprocedure instructions. Adv pt I will ask Dr.Smith if it is neccessary to repeat labs before the procedure, I will call her back to let her know she verbalized understanding.

## 2015-01-29 NOTE — Telephone Encounter (Signed)
Pt was on tcm list d/c 01/28/15 admitted for Unstable angina. Pt will be f/u with cardiologist Dr. Mare Ferrari...Johny Chess

## 2015-01-30 ENCOUNTER — Other Ambulatory Visit: Payer: Self-pay | Admitting: Interventional Cardiology

## 2015-01-30 DIAGNOSIS — I2510 Atherosclerotic heart disease of native coronary artery without angina pectoris: Secondary | ICD-10-CM

## 2015-01-30 NOTE — Telephone Encounter (Signed)
Pt aware that Dr.Smith has reviewed her labs done while hospitalized. Adv her she does not need labs repeated. Pt verbalized understanding.

## 2015-01-31 ENCOUNTER — Encounter: Payer: Self-pay | Admitting: Physician Assistant

## 2015-01-31 ENCOUNTER — Telehealth: Payer: Self-pay | Admitting: *Deleted

## 2015-01-31 NOTE — Telephone Encounter (Signed)
Pt notified of carotid results by phone with verbal understading to results given today. Pt advised to keep her appt w/Dr. [name] 6/10 to discuss carotid in further detail. Pt said ok and thank you.

## 2015-02-04 ENCOUNTER — Encounter (HOSPITAL_COMMUNITY)
Admission: RE | Disposition: A | Discharge status: Home / Self Care | Payer: Self-pay | Source: Ambulatory Visit | Attending: Interventional Cardiology

## 2015-02-04 ENCOUNTER — Ambulatory Visit (HOSPITAL_COMMUNITY)
Admission: RE | Admit: 2015-02-04 | Discharge: 2015-02-04 | Disposition: A | Discharge status: Home / Self Care | Payer: Medicare Other | Source: Ambulatory Visit | Attending: Interventional Cardiology | Admitting: Interventional Cardiology

## 2015-02-04 ENCOUNTER — Telehealth: Payer: Self-pay | Admitting: Interventional Cardiology

## 2015-02-04 ENCOUNTER — Encounter (HOSPITAL_COMMUNITY): Payer: Self-pay | Admitting: Interventional Cardiology

## 2015-02-04 DIAGNOSIS — Z794 Long term (current) use of insulin: Secondary | ICD-10-CM | POA: Insufficient documentation

## 2015-02-04 DIAGNOSIS — E785 Hyperlipidemia, unspecified: Secondary | ICD-10-CM | POA: Insufficient documentation

## 2015-02-04 DIAGNOSIS — I10 Essential (primary) hypertension: Secondary | ICD-10-CM | POA: Diagnosis not present

## 2015-02-04 DIAGNOSIS — I25110 Atherosclerotic heart disease of native coronary artery with unstable angina pectoris: Secondary | ICD-10-CM | POA: Diagnosis not present

## 2015-02-04 DIAGNOSIS — Z9861 Coronary angioplasty status: Secondary | ICD-10-CM | POA: Diagnosis not present

## 2015-02-04 DIAGNOSIS — I2584 Coronary atherosclerosis due to calcified coronary lesion: Secondary | ICD-10-CM | POA: Insufficient documentation

## 2015-02-04 DIAGNOSIS — Z86718 Personal history of other venous thrombosis and embolism: Secondary | ICD-10-CM | POA: Diagnosis not present

## 2015-02-04 DIAGNOSIS — R9439 Abnormal result of other cardiovascular function study: Secondary | ICD-10-CM | POA: Diagnosis present

## 2015-02-04 DIAGNOSIS — I2510 Atherosclerotic heart disease of native coronary artery without angina pectoris: Secondary | ICD-10-CM | POA: Diagnosis present

## 2015-02-04 DIAGNOSIS — E119 Type 2 diabetes mellitus without complications: Secondary | ICD-10-CM | POA: Insufficient documentation

## 2015-02-04 DIAGNOSIS — I480 Paroxysmal atrial fibrillation: Secondary | ICD-10-CM | POA: Diagnosis present

## 2015-02-04 DIAGNOSIS — I25118 Atherosclerotic heart disease of native coronary artery with other forms of angina pectoris: Secondary | ICD-10-CM | POA: Diagnosis present

## 2015-02-04 DIAGNOSIS — Z950 Presence of cardiac pacemaker: Secondary | ICD-10-CM | POA: Diagnosis not present

## 2015-02-04 DIAGNOSIS — E039 Hypothyroidism, unspecified: Secondary | ICD-10-CM | POA: Insufficient documentation

## 2015-02-04 DIAGNOSIS — Z7901 Long term (current) use of anticoagulants: Secondary | ICD-10-CM | POA: Insufficient documentation

## 2015-02-04 DIAGNOSIS — Z9884 Bariatric surgery status: Secondary | ICD-10-CM | POA: Diagnosis not present

## 2015-02-04 HISTORY — PX: CARDIAC CATHETERIZATION: SHX172

## 2015-02-04 LAB — BASIC METABOLIC PANEL
ANION GAP: 11 (ref 5–15)
BUN: 21 mg/dL — AB (ref 6–20)
CALCIUM: 9.4 mg/dL (ref 8.9–10.3)
CHLORIDE: 100 mmol/L — AB (ref 101–111)
CO2: 25 mmol/L (ref 22–32)
CREATININE: 1.31 mg/dL — AB (ref 0.44–1.00)
GFR calc non Af Amer: 39 mL/min — ABNORMAL LOW (ref >60)
GFR, EST AFRICAN AMERICAN: 46 mL/min — AB (ref >60)
GLUCOSE: 108 mg/dL — AB (ref 65–99)
Potassium: 4.2 mmol/L (ref 3.5–5.1)
Sodium: 136 mmol/L (ref 135–145)

## 2015-02-04 LAB — PROTIME-INR
INR: 1.07 (ref 0.00–1.49)
PROTHROMBIN TIME: 14.1 s (ref 11.6–15.2)

## 2015-02-04 LAB — CBC
HCT: 38.6 % (ref 36.0–46.0)
Hemoglobin: 12.7 g/dL (ref 12.0–15.0)
MCH: 29.2 pg (ref 26.0–34.0)
MCHC: 32.9 g/dL (ref 30.0–36.0)
MCV: 88.7 fL (ref 78.0–100.0)
Platelets: 330 10*3/uL (ref 150–400)
RBC: 4.35 MIL/uL (ref 3.87–5.11)
RDW: 13.7 % (ref 11.5–15.5)
WBC: 8 10*3/uL (ref 4.0–10.5)

## 2015-02-04 LAB — GLUCOSE, CAPILLARY: GLUCOSE-CAPILLARY: 112 mg/dL — AB (ref 65–99)

## 2015-02-04 SURGERY — LEFT HEART CATH AND CORONARY ANGIOGRAPHY

## 2015-02-04 MED ORDER — SODIUM CHLORIDE 0.9 % IV SOLN: 250.0000 mL | INTRAVENOUS | Status: DC | PRN

## 2015-02-04 MED ORDER — IOHEXOL 350 MG/ML SOLN
INTRAVENOUS | Status: DC | PRN
  Administered 2015-02-04: 120 mL via INTRAVENOUS

## 2015-02-04 MED ORDER — LIDOCAINE HCL (PF) 1 % IJ SOLN
INTRAMUSCULAR | Status: DC | PRN
  Administered 2015-02-04: 5 mL via INTRADERMAL

## 2015-02-04 MED ORDER — BUPROPION HCL ER (SR) 150 MG PO TB12: 150.0000 mg | ORAL_TABLET | Freq: Every morning | ORAL | Status: DC

## 2015-02-04 MED ORDER — NITROGLYCERIN 1 MG/10 ML FOR IR/CATH LAB
INTRA_ARTERIAL | Status: AC
  Filled 2015-02-04: qty 10

## 2015-02-04 MED ORDER — HEPARIN (PORCINE) IN NACL 2-0.9 UNIT/ML-% IJ SOLN
INTRAMUSCULAR | Status: AC
  Filled 2015-02-04: qty 1000

## 2015-02-04 MED ORDER — METFORMIN HCL 500 MG PO TABS: 1000.0000 mg | ORAL_TABLET | Freq: Two times a day (BID) | ORAL | Status: DC

## 2015-02-04 MED ORDER — NITROGLYCERIN 1 MG/10 ML FOR IR/CATH LAB
INTRA_ARTERIAL | Status: DC | PRN
  Administered 2015-02-04: 200 ug via INTRACORONARY

## 2015-02-04 MED ORDER — SODIUM CHLORIDE 0.9 % WEIGHT BASED INFUSION: 3.0000 mL/kg/h | INTRAVENOUS | Status: DC

## 2015-02-04 MED ORDER — FENTANYL CITRATE (PF) 100 MCG/2ML IJ SOLN
INTRAMUSCULAR | Status: AC
  Filled 2015-02-04: qty 2

## 2015-02-04 MED ORDER — HEPARIN (PORCINE) IN NACL 2-0.9 UNIT/ML-% IJ SOLN
INTRAMUSCULAR | Status: AC
  Filled 2015-02-04: qty 500

## 2015-02-04 MED ORDER — SODIUM CHLORIDE 0.9 % IJ SOLN: 3.0000 mL | INTRAMUSCULAR | Status: DC | PRN

## 2015-02-04 MED ORDER — MIDAZOLAM HCL 2 MG/2ML IJ SOLN
INTRAMUSCULAR | Status: DC | PRN
  Administered 2015-02-04: 1 mg via INTRAVENOUS

## 2015-02-04 MED ORDER — INSULIN GLARGINE 100 UNIT/ML ~~LOC~~ SOLN: 20.0000 [IU] | Freq: Every day | SUBCUTANEOUS | Status: DC

## 2015-02-04 MED ORDER — SODIUM CHLORIDE 0.9 % IJ SOLN
3.0000 mL | Freq: Two times a day (BID) | INTRAMUSCULAR | Status: DC
Start: 2015-02-04 — End: 2015-02-04

## 2015-02-04 MED ORDER — SODIUM CHLORIDE 0.9 % WEIGHT BASED INFUSION
3.0000 mL/kg/h | INTRAVENOUS | Status: DC
  Administered 2015-02-04: 3 mL/kg/h via INTRAVENOUS

## 2015-02-04 MED ORDER — MIDAZOLAM HCL 2 MG/2ML IJ SOLN
INTRAMUSCULAR | Status: AC
  Filled 2015-02-04: qty 2

## 2015-02-04 MED ORDER — VERAPAMIL HCL 2.5 MG/ML IV SOLN
INTRAVENOUS | Status: AC
  Filled 2015-02-04: qty 2

## 2015-02-04 MED ORDER — LEVOTHYROXINE SODIUM 125 MCG PO TABS: 125.0000 ug | ORAL_TABLET | Freq: Every day | ORAL | Status: DC

## 2015-02-04 MED ORDER — ONDANSETRON HCL 4 MG/2ML IJ SOLN: 4.0000 mg | Freq: Four times a day (QID) | INTRAMUSCULAR | Status: DC | PRN

## 2015-02-04 MED ORDER — GABAPENTIN 300 MG PO CAPS: 300.0000 mg | ORAL_CAPSULE | Freq: Two times a day (BID) | ORAL | Status: DC

## 2015-02-04 MED ORDER — HEPARIN SODIUM (PORCINE) 1000 UNIT/ML IJ SOLN
INTRAMUSCULAR | Status: AC
  Filled 2015-02-04: qty 1

## 2015-02-04 MED ORDER — HEPARIN SODIUM (PORCINE) 1000 UNIT/ML IJ SOLN
INTRAMUSCULAR | Status: DC | PRN
  Administered 2015-02-04: 6000 [IU] via INTRAVENOUS

## 2015-02-04 MED ORDER — SODIUM CHLORIDE 0.9 % WEIGHT BASED INFUSION
1.0000 mL/kg/h | INTRAVENOUS | Status: DC
Start: 2015-02-05 — End: 2015-02-04

## 2015-02-04 MED ORDER — SODIUM CHLORIDE 0.9 % IJ SOLN
INTRAMUSCULAR | Status: DC | PRN
  Administered 2015-02-04: 10:00:00 via INTRA_ARTERIAL

## 2015-02-04 MED ORDER — APIXABAN 5 MG PO TABS: 5.0000 mg | ORAL_TABLET | Freq: Two times a day (BID) | ORAL | Status: DC

## 2015-02-04 MED ORDER — ACETAMINOPHEN 325 MG PO TABS: 650.0000 mg | ORAL_TABLET | ORAL | Status: DC | PRN

## 2015-02-04 MED ORDER — ASPIRIN 81 MG PO CHEW
81.0000 mg | CHEWABLE_TABLET | ORAL | Status: DC
Start: 2015-02-04 — End: 2015-02-04

## 2015-02-04 MED ORDER — LISINOPRIL-HYDROCHLOROTHIAZIDE 20-12.5 MG PO TABS: 1.0000 | ORAL_TABLET | Freq: Every day | ORAL | Status: DC

## 2015-02-04 MED ORDER — ATORVASTATIN CALCIUM 80 MG PO TABS: 80.0000 mg | ORAL_TABLET | Freq: Every day | ORAL | Status: DC

## 2015-02-04 MED ORDER — FENOFIBRIC ACID 135 MG PO CPDR: 1.0000 | DELAYED_RELEASE_CAPSULE | Freq: Every day | ORAL | Status: DC

## 2015-02-04 MED ORDER — LIDOCAINE HCL (PF) 1 % IJ SOLN
INTRAMUSCULAR | Status: AC
  Filled 2015-02-04: qty 30

## 2015-02-04 MED ORDER — NITROGLYCERIN 0.4 MG SL SUBL: 0.4000 mg | SUBLINGUAL_TABLET | SUBLINGUAL | Status: DC | PRN

## 2015-02-04 MED ORDER — LATANOPROST 0.005 % OP SOLN: 1.0000 [drp] | Freq: Every day | OPHTHALMIC | Status: DC

## 2015-02-04 MED ORDER — FENTANYL CITRATE (PF) 100 MCG/2ML IJ SOLN
INTRAMUSCULAR | Status: DC | PRN
  Administered 2015-02-04: 50 ug via INTRAVENOUS

## 2015-02-04 SURGICAL SUPPLY — 9 items
CATH INFINITI 5 FR JL3.5 (CATHETERS) ×2 IMPLANT
CATH INFINITI JR4 5F (CATHETERS) ×2 IMPLANT
DEVICE RAD COMP TR BAND LRG (VASCULAR PRODUCTS) ×2 IMPLANT
GLIDESHEATH SLEND A-KIT 6F 22G (SHEATH) ×2 IMPLANT
KIT HEART LEFT (KITS) ×2 IMPLANT
PACK CARDIAC CATHETERIZATION (CUSTOM PROCEDURE TRAY) ×2 IMPLANT
TRANSDUCER W/STOPCOCK (MISCELLANEOUS) ×2 IMPLANT
TUBING CIL FLEX 10 FLL-RA (TUBING) ×2 IMPLANT
WIRE SAFE-T 1.5MM-J .035X260CM (WIRE) ×2 IMPLANT

## 2015-02-04 NOTE — Interval H&P Note (Signed)
Cath Lab Visit (complete for each Cath Lab visit)  Clinical Evaluation Leading to the Procedure:   ACS: No.  Non-ACS:    Anginal Classification: CCS III  Anti-ischemic medical therapy: Maximal Therapy (2 or more classes of medications)  Non-Invasive Test Results: Intermediate-risk stress test findings: cardiac mortality 1-3%/year  Prior CABG: No previous CABG      History and Physical Interval Note:  02/04/2015 6:56 AM  Mary [name]  has presented today for surgery, with the diagnosis of cp  The various methods of treatment have been discussed with the patient and family. After consideration of risks, benefits and other options for treatment, the patient has consented to  Procedure(s): Left Heart Cath and Coronary Angiography (N/A) as a surgical intervention .  The patient's history has been reviewed, patient examined, no change in status, stable for surgery.  I have reviewed the patient's chart and labs.  Questions were answered to the patient's satisfaction.     Sinclair Grooms

## 2015-02-04 NOTE — Telephone Encounter (Signed)
New Message       Pt calling stating that Dr. [name] did a CATH on her this morning and wants to know if she should still come to her appt on 02/07/15. Please call back and advise.

## 2015-02-04 NOTE — H&P (View-Only) (Signed)
Subjective: No further CP, HA or Nausea  Objective: Vital signs in last 24 hours: Temp:  [98 F (36.7 C)-98.6 F (37 C)] 98.4 F (36.9 C) (05/31 0438) Pulse Rate:  [58-76] 58 (05/31 0438) Resp:  [11-24] 18 (05/31 0438) BP: (104-162)/(37-89) 155/54 mmHg (05/31 0438) SpO2:  [93 %-99 %] 97 % (05/31 0438) Weight:  [246 lb 9.6 oz (111.857 kg)-250 lb 14.1 oz (113.8 kg)] 250 lb 14.1 oz (113.8 kg) (05/31 0500) Last BM Date: 01/27/15  Intake/Output from previous day: 05/30 0701 - 05/31 0700 In: 360 [P.O.:360] Out: 2 [Urine:2] Intake/Output this shift:    Medications Scheduled Meds: . atorvastatin  80 mg Oral q1800  . buPROPion  150 mg Oral q morning - 10a  . fenofibrate  160 mg Oral QHS  . gabapentin  300 mg Oral BID  . insulin aspart  0-20 Units Subcutaneous TID WC  . insulin aspart  0-5 Units Subcutaneous QHS  . insulin glargine  20 Units Subcutaneous QHS  . latanoprost  1 drop Right Eye QHS  . levothyroxine  125 mcg Oral QAC breakfast  . lisinopril  20 mg Oral Daily  . sodium chloride  3 mL Intravenous Q12H  . sodium chloride  3 mL Intravenous Q12H   Continuous Infusions: . sodium chloride    . heparin 1,550 Units/hr (01/28/15 0250)   PRN Meds:.sodium chloride, sodium chloride, acetaminophen, nitroGLYCERIN, ondansetron (ZOFRAN) IV, sodium chloride, sodium chloride  PE: General appearance: alert, cooperative and no distress Lungs: clear to auscultation bilaterally Heart: regular rate and rhythm, S1, S2 normal, no murmur, click, rub or gallop Extremities: No LEE Pulses: 2+ and symmetric Skin: Warm and dry Neurologic: Grossly normal  Lab Results:   Recent Labs  01/27/15 1011 01/27/15 1300 01/28/15 0525  WBC 7.1  --  8.3  HGB 12.1 13.6 12.0  HCT 37.3 40.0 37.5  PLT 306  --  303   BMET  Recent Labs  01/27/15 1011 01/27/15 1300 01/28/15 0525  NA 135 137 132*  K 4.5 4.9 4.3  CL 102 101 100*  CO2 25  --  24  GLUCOSE 226* 230* 160*  BUN 27* 28* 24*    CREATININE 1.25* 1.30* 1.09*  CALCIUM 8.4*  --  8.5*   PT/INR No results for input(s): LABPROT, INR in the last 72 hours. Cholesterol  Recent Labs  01/28/15 0525  CHOL 183   Lipid Panel     Component Value Date/Time   CHOL 183 01/28/2015 0525   TRIG 128 01/28/2015 0525   HDL 43 01/28/2015 0525   CHOLHDL 4.3 01/28/2015 0525   VLDL 26 01/28/2015 0525   LDLCALC 114* 01/28/2015 0525   Cardiac Panel (last 3 results)  Recent Labs  01/27/15 1911 01/27/15 2303 01/28/15 0525  TROPONINI <0.03 <0.03 <0.03       Assessment/Plan  74 y.o. female nonobstructive CAD by remote cardiac catheterization, PAF, diabetes, HTN, HL, hypothyroidism, colon CA, prior DVT, prior gastric bypass surgery. Patient moved back to the area from [place] in 2014. She underwent implantation of a dual-chamber pacemaker in 12/2013 secondary to symptomatic bradycardia with complete heart block. She is on Eliquis for anticoagulation.  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 There was a moderate area of anterolateral wall ischemia.   Principal Problem:   Unstable angina Ruled out for MI.  Plan for left heart cath tomorrow at 0830hrs.  IV heparin.  ASA allergy.  Lipitor, fenofibrate.  Recent  stress test: intermediate risk with evidence of prior infarct with peri-infarct ischemia, EF 55-65%. There was a moderate area of anterolateral wall ischemia.    SHE ONLY WANTS DR. [name] TO DO THE CATH.  He is not in the lab all week.       CAD (coronary artery disease)   Diabetes mellitus with complication  On SS insulin. Holding metformin   Hypothyroidism  TSH slightly low.  Check T4   Hypertension  Labile.  Continue to monitor.  ACE-I   Hyperlipidemia  Statin, fenofibrate   PAF (paroxysmal atrial fibrillation)  Eliquis held and on IV heparin.  No AF since 5/23   Cardiac pacemaker in situ  Normally functioning device   Dizziness    LOS: 1 day     HAGER, BRYAN PA-C 01/28/2015 9:57 AM  Patient seen, examined. Available data reviewed. Agree with findings, assessment, and plan as outlined by Tarri Fuller, PA-C. The patient was independently interviewed and examined. Exam reveals a pleasant, alert and oriented woman in no distress. Lungs are clear. Heart is regular rate and rhythm without murmur or gallop. There is trace pretibial edema.  Data reviewed. The patient has ruled out for myocardial infarction. No chest pain at all since admission. I discussed options with her. She is very clear that she does not want to proceed with cardiac catheterization done by anyone except for Dr. [name]. He is back in the cardiac catheterization lab next week. Considering the fact that she has ruled out for MI, I think it is reasonable to honor her with and schedule she understands to return immediately if she has recurrence of chest pain that is refractory to nitroglycerin. I have recommended that she hold beginning Sunday morning so that she takes no Eliquis on Sunday or Monday and cardiac cath can be done Tuesday by Dr [name]. Otherwise continue current Rx. She does have an aspirin allergy listed, but is not truly allergic. She had gastric bypass in 2009 and was told she should never take aspirin or NSAIDs. She also has a history of bleeding. Will hold off on aspirin considering her chronic anticoagulation and issues outlined above. She understands that if she has obstructive coronary disease, risks and benefits of aspirin will have to be reviewed.   Sherren Mocha, M.D. 01/28/2015 11:37 AM

## 2015-02-04 NOTE — Discharge Instructions (Signed)
Radial Site Care Refer to this sheet in the next few weeks. These instructions provide you with information on caring for yourself after your procedure. Your caregiver may also give you more specific instructions. Your treatment has been planned according to current medical practices, but problems sometimes occur. Call your caregiver if you have any problems or questions after your procedure. HOME CARE INSTRUCTIONS  You may shower the day after the procedure.Remove the bandage (dressing) and gently wash the site with plain soap and water.Gently pat the site dry.  Do not apply powder or lotion to the site.  Do not submerge the affected site in water for 3 to 5 days.  Inspect the site at least twice daily.  Do not flex or bend the affected arm for 24 hours.  No lifting over 5 pounds (2.3 kg) for 5 days after your procedure.  Do not drive home if you are discharged the same day of the procedure. Have someone else drive you.  You may drive 24 hours after the procedure unless otherwise instructed by your caregiver.  Do not operate machinery or power tools for 24 hours.  A responsible adult should be with you for the first 24 hours after you arrive home. What to expect:  Any bruising will usually fade within 1 to 2 weeks.  Blood that collects in the tissue (hematoma) may be painful to the touch. It should usually decrease in size and tenderness within 1 to 2 weeks. SEEK IMMEDIATE MEDICAL CARE IF:  You have unusual pain at the radial site.  You have redness, warmth, swelling, or pain at the radial site.  You have drainage (other than a small amount of blood on the dressing).  You have chills.  You have a fever or persistent symptoms for more than 72 hours.  You have a fever and your symptoms suddenly get worse.  Your arm becomes pale, cool, tingly, or numb.  You have heavy bleeding from the site. Hold pressure on the site. Document Released: 09/18/2010 Document Revised:  11/08/2011 Document Reviewed: 09/18/2010 [facility] Patient Information 2015 [place], [place]. This information is not intended to replace advice given to you by your health care provider. Make sure you discuss any questions you have with your health care provider.    NO METFORMIN FOR 2 DAYS

## 2015-02-04 NOTE — Telephone Encounter (Signed)
Patient has appointment on Friday. Dr. [name] did patient heart cath today. Patient wants to make sure appointment is not too soon or should she change her appointment to a later date. Will forward to Dr. [name] for advisement.

## 2015-02-05 ENCOUNTER — Other Ambulatory Visit: Payer: Self-pay | Admitting: Internal Medicine

## 2015-02-05 NOTE — Telephone Encounter (Signed)
Called patient and changed appointment for September. Patient verbalized understanding.

## 2015-02-05 NOTE — Telephone Encounter (Signed)
Move appointment back, 1-3 months.

## 2015-02-07 ENCOUNTER — Ambulatory Visit: Payer: Medicare Other | Admitting: Interventional Cardiology

## 2015-02-20 ENCOUNTER — Ambulatory Visit (INDEPENDENT_AMBULATORY_CARE_PROVIDER_SITE_OTHER): Payer: Medicare Other | Admitting: Internal Medicine

## 2015-02-20 ENCOUNTER — Encounter: Payer: Self-pay | Admitting: Internal Medicine

## 2015-02-20 VITALS — BP 132/78 | HR 82 | Temp 98.7°F | Resp 12 | Ht 67.0 in | Wt 253.0 lb

## 2015-02-20 DIAGNOSIS — E039 Hypothyroidism, unspecified: Secondary | ICD-10-CM

## 2015-02-20 DIAGNOSIS — I200 Unstable angina: Secondary | ICD-10-CM

## 2015-02-20 DIAGNOSIS — E118 Type 2 diabetes mellitus with unspecified complications: Secondary | ICD-10-CM | POA: Diagnosis not present

## 2015-02-20 DIAGNOSIS — I872 Venous insufficiency (chronic) (peripheral): Secondary | ICD-10-CM

## 2015-02-20 DIAGNOSIS — I10 Essential (primary) hypertension: Secondary | ICD-10-CM

## 2015-02-20 DIAGNOSIS — Z9884 Bariatric surgery status: Secondary | ICD-10-CM

## 2015-02-20 MED ORDER — BUPROPION HCL ER (XL) 150 MG PO TB24: 150.0000 mg | ORAL_TABLET | Freq: Every day | ORAL | Status: DC

## 2015-02-20 NOTE — Patient Instructions (Signed)
We have given you the prescription for the new compression stockings. Make sure to wear them daily to help with the swelling.   We will not check blood work today but will see you back in about 2-3 months to check on the diabetes.   Keep going with exercise and working on weight loss as this will help the heart and the joints and the diabetes.   Diabetes and Standards of Medical Care Diabetes is complicated. You may find that your diabetes team includes a dietitian, nurse, diabetes educator, eye doctor, and more. To help everyone know what is going on and to help you get the care you deserve, the following schedule of care was developed to help keep you on track. Below are the tests, exams, vaccines, medicines, education, and plans you will need. HbA1c test This test shows how well you have controlled your glucose over the past 2-3 months. It is used to see if your diabetes management plan needs to be adjusted.   It is performed at least 2 times a year if you are meeting treatment goals.  It is performed 4 times a year if therapy has changed or if you are not meeting treatment goals. Blood pressure test  This test is performed at every routine medical visit. The goal is less than 140/90 mm Hg for most people, but 130/80 mm Hg in some cases. Ask your health care provider about your goal. Dental exam  Follow up with the dentist regularly. Eye exam  If you are diagnosed with type 1 diabetes as a child, get an exam upon reaching the age of 71 years or older and have had diabetes for 3-5 years. Yearly eye exams are recommended after that initial eye exam.  If you are diagnosed with type 1 diabetes as an adult, get an exam within 5 years of diagnosis and then yearly.  If you are diagnosed with type 2 diabetes, get an exam as soon as possible after the diagnosis and then yearly. Foot care exam  Visual foot exams are performed at every routine medical visit. The exams check for cuts, injuries,  or other problems with the feet.  A comprehensive foot exam should be done yearly. This includes visual inspection as well as assessing foot pulses and testing for loss of sensation.  Check your feet nightly for cuts, injuries, or other problems with your feet. Tell your health care provider if anything is not healing. Kidney function test (urine microalbumin)  This test is performed once a year.  Type 1 diabetes: The first test is performed 5 years after diagnosis.  Type 2 diabetes: The first test is performed at the time of diagnosis.  A serum creatinine and estimated glomerular filtration rate (eGFR) test is done once a year to assess the level of chronic kidney disease (CKD), if present. Lipid profile (cholesterol, HDL, LDL, triglycerides)  Performed every 5 years for most people.  The goal for LDL is less than 100 mg/dL. If you are at high risk, the goal is less than 70 mg/dL.  The goal for HDL is 40 mg/dL-50 mg/dL for men and 50 mg/dL-60 mg/dL for women. An HDL cholesterol of 60 mg/dL or higher gives some protection against heart disease.  The goal for triglycerides is less than 150 mg/dL. Influenza vaccine, pneumococcal vaccine, and hepatitis B vaccine  The influenza vaccine is recommended yearly.  It is recommended that people with diabetes who are over 19 years old get the pneumonia vaccine. In some cases,  two separate shots may be given. Ask your health care provider if your pneumonia vaccination is up to date.  The hepatitis B vaccine is also recommended for adults with diabetes. Diabetes self-management education  Education is recommended at diagnosis and ongoing as needed. Treatment plan  Your treatment plan is reviewed at every medical visit. Document Released: 06/13/2009 Document Revised: 12/31/2013 Document Reviewed: 01/16/2013 [facility] Patient Information 2015 [place], [place]. This information is not intended to replace advice given to you by your health care  provider. Make sure you discuss any questions you have with your health care provider.

## 2015-02-20 NOTE — Progress Notes (Signed)
   Subjective:    Patient ID: Mary [name], female    DOB: 01-27-1941, 74 y.o.   MRN: [id number]  HPI The patient is a 74 YO female coming in for leg swelling. She has not been wearing her compression hose lately but is taking her medicines as prescribed. She has been sitting around the house not doing anything the last few weeks. No new SOB. No chest pains. No recent travel and both legs swollen. No pain with breathing. Please see A/P for status and treatment of chronic medical problems.   PMH, [facility], social history reviewed and updated at visit.  Review of Systems  Constitutional: Positive for activity change. Negative for chills, appetite change, fatigue and unexpected weight change.       Doing less  HENT: Negative.   Eyes: Negative.   Respiratory: Negative for cough, chest tightness, shortness of breath and wheezing.   Cardiovascular: Positive for leg swelling. Negative for chest pain and palpitations.  Gastrointestinal: Negative for abdominal pain, diarrhea, constipation and abdominal distention.  Musculoskeletal: Positive for arthralgias. Negative for myalgias and back pain.  Skin: Negative.   Neurological: Positive for numbness. Negative for weakness.  Psychiatric/Behavioral: Negative.       Objective:   Physical Exam  Constitutional: She is oriented to person, place, and time. She appears well-developed and well-nourished.  HENT:  Head: Normocephalic and atraumatic.  Eyes: EOM are normal.  Neck: Normal range of motion.  Cardiovascular: Normal rate and regular rhythm.   Pulmonary/Chest: Effort normal and breath sounds normal. No respiratory distress. She has no wheezes.  Abdominal: Soft. Bowel sounds are normal. She exhibits no distension. There is no tenderness. There is no rebound.  Musculoskeletal: She exhibits edema.  Legs swollen to mid shin 2+, equal, no calf tenderness  Neurological: She is alert and oriented to person, place, and time. Coordination normal.  Skin:  Skin is warm and dry.  Psychiatric: She has a normal mood and affect.   Filed Vitals:   02/20/15 1301  BP: 132/78  Pulse: 82  Temp: 98.7 F (37.1 C)  TempSrc: Oral  Resp: 12  Height: 5\' 7"  (1.702 m)  Weight: 253 lb (114.76 kg)  SpO2: 93%      Assessment & Plan:  Patient declines all immunizations.

## 2015-02-20 NOTE — Progress Notes (Signed)
Pre visit review using our clinic review tool, if applicable. No additional management support is needed unless otherwise documented below in the visit note. 

## 2015-02-21 DIAGNOSIS — I872 Venous insufficiency (chronic) (peripheral): Secondary | ICD-10-CM | POA: Insufficient documentation

## 2015-02-21 NOTE — Assessment & Plan Note (Signed)
BP at goal today on lisinopril/hctz. Last BMP reviewed and no adjustments needed at today's visit.

## 2015-02-21 NOTE — Assessment & Plan Note (Signed)
Rx for compression stockings given today to replace hers (are worn out). She is advised to have her feet elevated when sitting and also advised to move around more to prevent edema.

## 2015-02-21 NOTE — Assessment & Plan Note (Signed)
Definitely needs monitoring of nutrient levels. Will get records as patient states they have been watched and if no records will check at next visit. She does have neuropathy which could be worsened with low B12.

## 2015-02-21 NOTE — Assessment & Plan Note (Signed)
She states recently checked and will obtain records. Continue synthroid and adjust as needed.

## 2015-02-21 NOTE — Assessment & Plan Note (Signed)
Most recent HgA1c at goal, peripheral neuropathy is stable and still present. Not compromising her walking or ADLs at this time. Continue metformin, lantus, lisinopril. On statin.

## 2015-02-22 ENCOUNTER — Other Ambulatory Visit: Payer: Self-pay | Admitting: Internal Medicine

## 2015-02-24 ENCOUNTER — Other Ambulatory Visit: Payer: Self-pay

## 2015-02-24 MED ORDER — APIXABAN 5 MG PO TABS: 5.0000 mg | ORAL_TABLET | Freq: Two times a day (BID) | ORAL | Status: DC

## 2015-04-14 ENCOUNTER — Ambulatory Visit (INDEPENDENT_AMBULATORY_CARE_PROVIDER_SITE_OTHER): Payer: Medicare Other | Admitting: *Deleted

## 2015-04-14 ENCOUNTER — Encounter: Payer: Self-pay | Admitting: Internal Medicine

## 2015-04-14 DIAGNOSIS — I442 Atrioventricular block, complete: Secondary | ICD-10-CM

## 2015-04-14 NOTE — Progress Notes (Signed)
Remote pacemaker transmission.   

## 2015-04-17 LAB — CUP PACEART REMOTE DEVICE CHECK
Battery Remaining Longevity: 119 mo
Battery Remaining Percentage: 95.5 %
Brady Statistic AP VP Percent: 15 %
Brady Statistic AP VS Percent: 1 %
Brady Statistic AS VP Percent: 85 %
Brady Statistic RA Percent Paced: 15 %
Brady Statistic RV Percent Paced: 99 %
Lead Channel Impedance Value: 380 Ohm
Lead Channel Pacing Threshold Amplitude: 0.5 V
Lead Channel Pacing Threshold Pulse Width: 0.4 ms
Lead Channel Pacing Threshold Pulse Width: 0.6 ms
Lead Channel Sensing Intrinsic Amplitude: 1.2 mV
Lead Channel Sensing Intrinsic Amplitude: 11.9 mV
Lead Channel Setting Pacing Amplitude: 0.75 V
MDC IDC MSMT BATTERY VOLTAGE: 3.01 V
MDC IDC MSMT LEADCHNL RA PACING THRESHOLD AMPLITUDE: 1 V
MDC IDC MSMT LEADCHNL RV IMPEDANCE VALUE: 450 Ohm
MDC IDC SESS DTM: 20160815060025
MDC IDC SET LEADCHNL RA PACING AMPLITUDE: 2.5 V
MDC IDC SET LEADCHNL RV PACING PULSEWIDTH: 0.4 ms
MDC IDC SET LEADCHNL RV SENSING SENSITIVITY: 4 mV
MDC IDC STAT BRADY AS VS PERCENT: 1 %
Pulse Gen Serial Number: 3013730

## 2015-04-22 ENCOUNTER — Encounter: Payer: Self-pay | Admitting: Cardiology

## 2015-05-05 DIAGNOSIS — R2232 Localized swelling, mass and lump, left upper limb: Secondary | ICD-10-CM | POA: Diagnosis not present

## 2015-05-13 ENCOUNTER — Telehealth: Payer: Self-pay | Admitting: Interventional Cardiology

## 2015-05-13 NOTE — Telephone Encounter (Signed)
Returned pt call. Pt rqst her husband who is also a pt of Dr.Smith be seen instead of her for her scheduled appt on 9/15 @3 :15pm.pt sts that she is doing well and can be seen at another time. She sts that her husband home some complication during his gallbladder sx, he defibrillator went of twice, and pt had to be shocked back in 2 rhythm. appt switched. Pt voiced appreciation and verbalized understanding.

## 2015-05-13 NOTE — Telephone Encounter (Signed)
New Message  Pt calling to speak w/ Lattie Haw concerning appt on Fri 9/16. Please call back and discuss.

## 2015-05-16 ENCOUNTER — Ambulatory Visit: Payer: Medicare Other | Admitting: Interventional Cardiology

## 2015-05-16 ENCOUNTER — Encounter: Payer: Self-pay | Admitting: Interventional Cardiology

## 2015-05-16 ENCOUNTER — Ambulatory Visit (INDEPENDENT_AMBULATORY_CARE_PROVIDER_SITE_OTHER): Payer: Medicare Other | Admitting: Interventional Cardiology

## 2015-05-16 VITALS — BP 152/62 | HR 65 | Ht 67.0 in | Wt 255.1 lb

## 2015-05-16 DIAGNOSIS — E785 Hyperlipidemia, unspecified: Secondary | ICD-10-CM | POA: Diagnosis not present

## 2015-05-16 DIAGNOSIS — I480 Paroxysmal atrial fibrillation: Secondary | ICD-10-CM

## 2015-05-16 DIAGNOSIS — I200 Unstable angina: Secondary | ICD-10-CM | POA: Diagnosis not present

## 2015-05-16 DIAGNOSIS — I10 Essential (primary) hypertension: Secondary | ICD-10-CM | POA: Diagnosis not present

## 2015-05-16 DIAGNOSIS — Z7901 Long term (current) use of anticoagulants: Secondary | ICD-10-CM | POA: Diagnosis not present

## 2015-05-16 DIAGNOSIS — I2510 Atherosclerotic heart disease of native coronary artery without angina pectoris: Secondary | ICD-10-CM

## 2015-05-16 DIAGNOSIS — Z950 Presence of cardiac pacemaker: Secondary | ICD-10-CM

## 2015-05-16 NOTE — Progress Notes (Signed)
Cardiology Office Note   Date:  05/16/2015   ID:  Mary [name], DOB [birth year], MRN [id number]  PCP:  Olga Millers, MD  Cardiologist:  Sinclair Grooms, MD   Chief Complaint  Patient presents with  . Atrial Fibrillation      History of Present Illness: Mary [name] is a 74 y.o. female who presents for chronic nonobstructive coronary disease, diastolic heart failure, paroxysmal atrial fibrillation, tachycardia-bradycardia syndrome with permanent pacemaker, history of DVT, hyperlipidemia, diabetes mellitus type 2 with complications, and carotid disease.  Doing well without angina. Chronic dyspnea is present. Occasional falls. Greater than 3 months ago she had a fall and injured her face. There is lower extremity swelling. She denies transient neurological symptoms. No blood in the urine or stool. No claudication.    Past Medical History  Diagnosis Date  . Hypertension   . CAD (coronary artery disease)     non obstructive  . Hyperlipidemia   . Hypothyroidism   . DVT (deep venous thrombosis) 2013    "twice behind knee on left side" (06/25/2013)  . Foot fracture 2013    "left; fell; never had OR; just wrapped good and wore brace" (06/25/2013)  . Sleep apnea     "cleared up after I lost 134# w/gastric bypass" (06/25/2013)  . Iron deficiency anemia     "had 3 iron infusions in 2013" (06/25/2013)  . Headache(784.0)     "very frequent recently; don't usually have headaches at all" (06/25/2013)  . Arthritis     "very bad; knees and hands" (06/25/2013)  . Chronic lower back pain   . Colon cancer 2012    "started chemo 12/23/2010" (06/25/2013)  . Multinodular goiter   . Colon polyps   . CHF (congestive heart failure)   . Glaucoma   . Type II diabetes mellitus   . Diabetic peripheral neuropathy   . Falls infrequently   . Polyneuropathy in diabetes(357.2)   . Carotid stenosis     Carotid US 5/16:  Bilateral ICA 1-39%; L vertebral retrograde; L BP 126/49, R BP  140/57    Past Surgical History  Procedure Laterality Date  . Roux-en-y gastric bypass  2009  . Cardiac catheterization  2006    Never had PCI, 3 caths total. Last one in [place]  . Cholecystectomy  1980's  . Umbilical hernia repair  1970's?     (06/25/2013)  . Ulnar tunnel release Left ~ 2008  . Cataract extraction, bilateral Bilateral 2013    "and put stent in my left eye for glaucoma" (06/25/2013)  . Eye muscle surgery Bilateral ~ 1963    "muscles too long; eyes would go out and up; tied muscles to hold my eyes straight" (06/25/2013)  . Portacath placement Left 11/2010  . Colectomy  10/2010    Tumor removal  . Pacemaker insertion  01/02/2014    STJ Assurity dual chamber pacemaker implnated by Dr Lovena Le for SSS  . Lead revision  01-03-2014    atrial lead revision by Dr Rayann Heman  . Permanent pacemaker insertion N/A 01/02/2014    Procedure: PERMANENT PACEMAKER INSERTION;  Surgeon: Evans Lance, MD;  Location: [facility] CATH LAB;  Service: Cardiovascular;  Laterality: N/A;  . Lead revision N/A 01/03/2014    Procedure: LEAD REVISION;  Surgeon: Coralyn Mark, MD;  Location: [facility] CATH LAB;  Service: Cardiovascular;  Laterality: N/A;  . Cardiac catheterization N/A 02/04/2015    Procedure: Left Heart Cath and Coronary Angiography;  Surgeon: Lynnell Dike  [name], MD;  Location: [facility] CV LAB;  Service: Cardiovascular;  Laterality: N/A;     Current Outpatient Prescriptions  Medication Sig Dispense Refill  . apixaban (ELIQUIS) 5 MG TABS tablet Take 1 tablet (5 mg total) by mouth 2 (two) times daily. 60 tablet 6  . atorvastatin (LIPITOR) 80 MG tablet Take 1 tablet (80 mg total) by mouth daily. 30 tablet 6  . bimatoprost (LUMIGAN) 0.01 % SOLN Place 1 drop into the right eye at bedtime. Right eye only    . buPROPion (WELLBUTRIN XL) 150 MG 24 hr tablet Take 1 tablet (150 mg total) by mouth daily. 30 tablet 6  . Calcium Citrate-Vitamin D (CITRACAL + D PO) Take 1 tablet by mouth 2 (two) times daily.    .  Choline Fenofibrate (FENOFIBRIC ACID) 135 MG CPDR TAKE ONE CAPSULE BY MOUTH EVERY NIGHT AT BEDTIME 30 capsule 6  . gabapentin (NEURONTIN) 300 MG capsule Take 300 mg by mouth 2 (two) times daily.     . insulin glargine (LANTUS) 100 UNIT/ML injection Inject 20 Units into the skin at bedtime.    Marland Kitchen levothyroxine (SYNTHROID, LEVOTHROID) 112 MCG tablet Take 112 mcg by mouth daily before breakfast.    . lisinopril-hydrochlorothiazide (PRINZIDE,ZESTORETIC) 20-12.5 MG per tablet take 2 tablets by mouth every evening 60 tablet 5  . metFORMIN (GLUCOPHAGE) 1000 MG tablet Take 1,000 mg by mouth 2 (two) times daily with a meal.     . nitroGLYCERIN (NITROSTAT) 0.4 MG SL tablet Place 1 tablet (0.4 mg total) under the tongue every 5 (five) minutes as needed for chest pain. 90 tablet 3   No current facility-administered medications for this visit.    Allergies:   Other; Adhesive; Aspirin; Latex; and Nsaids    Social History:  The patient  reports that she has been smoking Cigarettes.  She has a 30 pack-year smoking history. She has never used smokeless tobacco. She reports that she drinks alcohol. She reports that she does not use illicit drugs.   Family History:  The patient's family history includes Bladder Cancer in her cousin; Breast cancer in her maternal aunt, mother, and sister; Clotting disorder in her sister; Colon polyps in her brother; Diabetes in her maternal grandmother, mother, and sister; Heart attack in her brother, father, and sister; Heart disease in her father; Pancreatic cancer in her brother; Prostate cancer in her brother.    ROS:  Please see the history of present illness.   Otherwise, review of systems are positive for shortness of breath on exertion with associated chronic cough, diarrhea, back pain, muscle pain, easy bruising, difficulty with balance and walking, excessive fatigue, and irregular heartbeat..   All other systems are reviewed and negative.    PHYSICAL EXAM: VS:  BP 152/62  mmHg  Pulse 65  Ht 5\' 7"  (1.702 m)  Wt 115.722 kg (255 lb 1.9 oz)  BMI 39.95 kg/m2  SpO2 97% , BMI Body mass index is 39.95 kg/(m^2). GEN: Well nourished, well developed, in no acute distressPeriod obese and chronically ill appearing HEENT: normal Neck: no JVD, carotid bruits, or masses Cardiac: RRR.  There is no murmur, rub, or gallop. There is trace to 1+ bilateral lower extremity edema. Respiratory:  clear to auscultation bilaterally, normal work of breathing. GI: soft, nontender, nondistended, + BS MS: no deformity or atrophy Skin: warm and dry, no rash Neuro:  Strength and sensation are intact Psych: euthymic mood, full affect   EKG:  EKG is not ordered today.   Recent  Labs: 01/27/2015: ALT 12*; B Natriuretic Peptide 55.7; TSH 0.314* 02/04/2015: BUN 21*; Creatinine, Ser 1.31*; Hemoglobin 12.7; Platelets 330; Potassium 4.2; Sodium 136    Lipid Panel    Component Value Date/Time   CHOL 183 01/28/2015 0525   TRIG 128 01/28/2015 0525   HDL 43 01/28/2015 0525   CHOLHDL 4.3 01/28/2015 0525   VLDL 26 01/28/2015 0525   LDLCALC 114* 01/28/2015 0525      Wt Readings from Last 3 Encounters:  05/16/15 115.722 kg (255 lb 1.9 oz)  02/20/15 114.76 kg (253 lb)  02/04/15 113.399 kg (250 lb)      Other studies Reviewed: Additional studies/ records that were reviewed today include: None. The findings include none.    ASSESSMENT AND PLAN:  1. PAF (paroxysmal atrial fibrillation)  occasional palpitations suggesting recurrent atrial fibrillation. These been short-lived.  2. Long term current use of anticoagulant therapy  no bleeding complications. 2 months ago she did have a significant fall but did not reported to [place].  3. Hyperlipidemia  followed by primary care  4. Essential hypertension  controlled  5. Coronary artery disease involving native coronary artery of native heart without angina pectoris  no angina  6. Cardiac pacemaker in situ  permanent pacemaker with  normal function for high-grade AV block    Current medicines are reviewed at length with the patient today.  The patient has the following concerns regarding medicines:  none.  The following changes/actions have been instituted:     continue the current course of therapy with the patient cautioned to call if any falls or head trauma.  Labs/ tests ordered today include:  No orders of the defined types were placed in this encounter.     Disposition:   FU with HS in 7 months  Signed, Sinclair Grooms, MD  05/16/2015 5:52 PM    [address], [place], [place]  [address] Phone: [phone]; Fax: [phone]

## 2015-05-16 NOTE — Patient Instructions (Signed)
Medication Instructions:  Your physician recommends that you continue on your current medications as directed. Please refer to the Current Medication list given to you today.   Labwork: None ordered  Testing/Procedures: None ordered  Follow-Up: Your physician wants you to follow-up in: 6 months with Dr.Smith You will receive a reminder letter in the mail two months in advance. If you don't receive a letter, please call our office to schedule the follow-up appointment.   Any Other Special Instructions Will Be Listed Below (If Applicable).   

## 2015-05-23 ENCOUNTER — Other Ambulatory Visit: Payer: Self-pay

## 2015-05-23 NOTE — Telephone Encounter (Signed)
Mary Crome, MD at 05/16/2015 4:10 PM  3. Hyperlipidemia followed by primary care

## 2015-05-29 ENCOUNTER — Other Ambulatory Visit (HOSPITAL_BASED_OUTPATIENT_CLINIC_OR_DEPARTMENT_OTHER): Payer: Medicare Other

## 2015-05-29 ENCOUNTER — Ambulatory Visit (HOSPITAL_BASED_OUTPATIENT_CLINIC_OR_DEPARTMENT_OTHER): Payer: Medicare Other | Admitting: Oncology

## 2015-05-29 ENCOUNTER — Telehealth: Payer: Self-pay | Admitting: Oncology

## 2015-05-29 VITALS — BP 149/58 | HR 75 | Temp 99.1°F | Resp 17 | Ht 67.0 in | Wt 253.0 lb

## 2015-05-29 DIAGNOSIS — C189 Malignant neoplasm of colon, unspecified: Secondary | ICD-10-CM | POA: Diagnosis not present

## 2015-05-29 DIAGNOSIS — I200 Unstable angina: Secondary | ICD-10-CM

## 2015-05-29 NOTE — Progress Notes (Signed)
  [facility] OFFICE PROGRESS NOTE   Diagnosis: Colon cancer  INTERVAL HISTORY:   Mary [name] returns as scheduled. She feels well. She continues to have painful neuropathy symptoms in the feet. She takes gabapentin 3 times per day. Good appetite.  Objective:  Vital signs in last 24 hours:  Blood pressure 149/58, pulse 75, temperature 99.1 F (37.3 C), temperature source Oral, resp. rate 17, height 5\' 7"  (1.702 m), weight 253 lb (114.76 kg), SpO2 96 %.    HEENT: Neck without mass Lymphatics: No cervical, supraclavicular, axillary, or inguinal nodes Resp: Lungs clear bilaterally Cardio: Regular rate and rhythm GI: No hepatomegaly, no mass, nontender Vascular: No leg edema   Lab Results:  Lab Results  Component Value Date   WBC 8.0 02/04/2015   HGB 12.7 02/04/2015   HCT 38.6 02/04/2015   MCV 88.7 02/04/2015   PLT 330 02/04/2015   NEUTROABS 4.7 01/27/2015      Lab Results  Component Value Date   CEA 1.6 11/21/2014     Medications: I have reviewed the patient's current medications.  Assessment/Plan: 1. Stage III (pT3,pN1c,M0) adenocarcinoma transverse colon, status post a laparoscopic left colectomy 11/30/2010  -12 cycles of FOLFOX chemotherapy initiated on 12/23/2010, oxaliplatin was held after? Cycle 7 of chemotherapy secondary to neuropathy  -Surveillance colonoscopy March 2013, removal of a rectal tubular adenoma  -Negative surveillance CT scans 12/12/2012  -Negative abdomen CT 06/11/2014 -Negative surveillance colonoscopy 10/02/2014 2. History of iron deficiency anemia secondary gastric bypass surgery, status post Venofer, last given 07/03/2012  3. Gastric bypass surgery in 2009  4. Diabetes  5. Hypertension  6. 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 Extensive family history of cancer  9. Paroxysmal atrial fibrillation and Mobitz 1 second  degree AV block  10. Hypothyroidism     Disposition:  Mary [name] remains in clinical remission from colon cancer. We will follow-up on the CEA from today. She has an extensive family history of cancer including pancreas cancer in her brother and multiple maternal family members with breast cancer. She agrees to a Retail buyer referral. Mary [name] will return for an office visit and CEA in 6 months.  [name], MD  05/29/2015  12:04 PM

## 2015-05-29 NOTE — Telephone Encounter (Signed)
per pof to sch pt appt-gave pt copy of avs °

## 2015-05-30 ENCOUNTER — Telehealth: Payer: Self-pay | Admitting: *Deleted

## 2015-05-30 LAB — CEA: CEA: 1.9 ng/mL (ref 0.0–5.0)

## 2015-05-30 NOTE — Telephone Encounter (Signed)
-----   Message from Ladell Pier, MD sent at 05/30/2015  8:38 AM EDT ----- Please call patient, cea is normal, f/u as scheduled

## 2015-05-30 NOTE — Telephone Encounter (Signed)
Message from pt requesting CEA results. Called her with normal result. She voiced appreciation for call.

## 2015-06-23 DIAGNOSIS — F17200 Nicotine dependence, unspecified, uncomplicated: Secondary | ICD-10-CM | POA: Diagnosis not present

## 2015-06-23 DIAGNOSIS — Z950 Presence of cardiac pacemaker: Secondary | ICD-10-CM | POA: Diagnosis not present

## 2015-06-23 DIAGNOSIS — I4891 Unspecified atrial fibrillation: Secondary | ICD-10-CM | POA: Diagnosis not present

## 2015-06-23 DIAGNOSIS — I10 Essential (primary) hypertension: Secondary | ICD-10-CM | POA: Diagnosis not present

## 2015-06-23 DIAGNOSIS — E119 Type 2 diabetes mellitus without complications: Secondary | ICD-10-CM | POA: Diagnosis not present

## 2015-06-23 DIAGNOSIS — I2510 Atherosclerotic heart disease of native coronary artery without angina pectoris: Secondary | ICD-10-CM | POA: Diagnosis not present

## 2015-06-23 DIAGNOSIS — M7989 Other specified soft tissue disorders: Secondary | ICD-10-CM | POA: Diagnosis not present

## 2015-06-23 DIAGNOSIS — S93602A Unspecified sprain of left foot, initial encounter: Secondary | ICD-10-CM | POA: Diagnosis not present

## 2015-06-23 DIAGNOSIS — Z85038 Personal history of other malignant neoplasm of large intestine: Secondary | ICD-10-CM | POA: Diagnosis not present

## 2015-07-04 ENCOUNTER — Other Ambulatory Visit: Payer: Self-pay

## 2015-07-04 MED ORDER — FENOFIBRIC ACID 135 MG PO CPDR: 1.0000 | DELAYED_RELEASE_CAPSULE | Freq: Every day | ORAL | Status: DC

## 2015-07-14 ENCOUNTER — Ambulatory Visit (INDEPENDENT_AMBULATORY_CARE_PROVIDER_SITE_OTHER): Payer: Medicare Other | Admitting: *Deleted

## 2015-07-14 DIAGNOSIS — I442 Atrioventricular block, complete: Secondary | ICD-10-CM

## 2015-07-14 NOTE — Progress Notes (Signed)
Remote pacemaker transmission.   

## 2015-07-16 ENCOUNTER — Other Ambulatory Visit: Payer: Self-pay | Admitting: Internal Medicine

## 2015-07-23 LAB — CUP PACEART REMOTE DEVICE CHECK
Battery Remaining Longevity: 119 mo
Battery Remaining Percentage: 95.5 %
Battery Voltage: 3.01 V
Brady Statistic AP VP Percent: 17 %
Brady Statistic AP VS Percent: 1 %
Brady Statistic RV Percent Paced: 99 %
Implantable Lead Implant Date: 20150506
Implantable Lead Implant Date: 20150506
Implantable Lead Location: 753859
Lead Channel Impedance Value: 390 Ohm
Lead Channel Impedance Value: 490 Ohm
Lead Channel Pacing Threshold Amplitude: 0.5 V
Lead Channel Pacing Threshold Amplitude: 1 V
Lead Channel Pacing Threshold Pulse Width: 0.4 ms
Lead Channel Sensing Intrinsic Amplitude: 0.9 mV
Lead Channel Sensing Intrinsic Amplitude: 10.4 mV
Lead Channel Setting Pacing Amplitude: 0.75 V
Lead Channel Setting Pacing Pulse Width: 0.4 ms
Lead Channel Setting Sensing Sensitivity: 4 mV
MDC IDC LEAD LOCATION: 753860
MDC IDC MSMT LEADCHNL RA PACING THRESHOLD PULSEWIDTH: 0.6 ms
MDC IDC PG SERIAL: 3013730
MDC IDC SESS DTM: 20161114073823
MDC IDC SET LEADCHNL RA PACING AMPLITUDE: 2.5 V
MDC IDC STAT BRADY AS VP PERCENT: 83 %
MDC IDC STAT BRADY AS VS PERCENT: 1 %
MDC IDC STAT BRADY RA PERCENT PACED: 17 %
Pulse Gen Model: 2240

## 2015-07-29 ENCOUNTER — Encounter: Payer: Self-pay | Admitting: Cardiology

## 2015-08-04 ENCOUNTER — Encounter: Payer: Self-pay | Admitting: Internal Medicine

## 2015-08-04 ENCOUNTER — Ambulatory Visit (INDEPENDENT_AMBULATORY_CARE_PROVIDER_SITE_OTHER): Payer: Medicare Other | Admitting: Internal Medicine

## 2015-08-04 VITALS — BP 132/84 | HR 86 | Temp 98.3°F | Resp 16 | Ht 67.0 in | Wt 254.0 lb

## 2015-08-04 DIAGNOSIS — R059 Cough, unspecified: Secondary | ICD-10-CM

## 2015-08-04 DIAGNOSIS — I200 Unstable angina: Secondary | ICD-10-CM | POA: Diagnosis not present

## 2015-08-04 DIAGNOSIS — I10 Essential (primary) hypertension: Secondary | ICD-10-CM

## 2015-08-04 DIAGNOSIS — Z23 Encounter for immunization: Secondary | ICD-10-CM

## 2015-08-04 DIAGNOSIS — R05 Cough: Secondary | ICD-10-CM | POA: Diagnosis not present

## 2015-08-04 MED ORDER — HYDROCODONE-ACETAMINOPHEN 5-325 MG PO TABS: 1.0000 | ORAL_TABLET | Freq: Four times a day (QID) | ORAL | Status: DC | PRN

## 2015-08-04 MED ORDER — LOSARTAN POTASSIUM-HCTZ 100-25 MG PO TABS: 1.0000 | ORAL_TABLET | Freq: Every day | ORAL | Status: DC

## 2015-08-04 NOTE — Progress Notes (Signed)
Pre visit review using our clinic review tool, if applicable. No additional management support is needed unless otherwise documented below in the visit note. 

## 2015-08-04 NOTE — Patient Instructions (Signed)
We have given you the refill on the hydrocodone for the pain.   We have sent in a replacement blood pressure medicine which should not cause cough. It is called losartan and you will only take 1 pill daily instead of the 2 you were taking with the previous medicine.

## 2015-08-05 DIAGNOSIS — R05 Cough: Secondary | ICD-10-CM | POA: Insufficient documentation

## 2015-08-05 DIAGNOSIS — R059 Cough, unspecified: Secondary | ICD-10-CM | POA: Insufficient documentation

## 2015-08-05 NOTE — Progress Notes (Signed)
   Subjective:    Patient ID: Mary [name], female    DOB: Jan 18, 1941, 74 y.o.   MRN: [id number]  HPI The patient is a 74 YO female coming in for cough. She has been taking her lisinopril for several years now but noticed in the last 2-3 months a cough that is dry that will not go away. She did have a cold around the time it started but has not cleared. No fever or chills. No SOB and not productive. Denies much drainage or nose congestion.   Review of Systems  Constitutional: Negative for chills, activity change, appetite change, fatigue and unexpected weight change.  HENT: Negative.   Eyes: Negative.   Respiratory: Positive for cough. Negative for chest tightness, shortness of breath and wheezing.   Cardiovascular: Positive for leg swelling. Negative for chest pain and palpitations.  Gastrointestinal: Negative for abdominal pain, diarrhea, constipation and abdominal distention.  Musculoskeletal: Positive for arthralgias. Negative for myalgias and back pain.  Skin: Negative.   Neurological: Positive for numbness. Negative for weakness.  Psychiatric/Behavioral: Negative.       Objective:   Physical Exam  Constitutional: She is oriented to person, place, and time. She appears well-developed and well-nourished.  HENT:  Head: Normocephalic and atraumatic.  Nose: Nose normal.  Mouth/Throat: Oropharynx is clear and moist.  Eyes: EOM are normal.  Neck: Normal range of motion.  Cardiovascular: Normal rate and regular rhythm.   Pulmonary/Chest: Effort normal and breath sounds normal. No respiratory distress. She has no wheezes. She has no rales.  Abdominal: Soft. Bowel sounds are normal. She exhibits no distension. There is no tenderness. There is no rebound.  Musculoskeletal: She exhibits edema.  Legs swollen to mid shin 2+, equal, no calf tenderness  Neurological: She is alert and oriented to person, place, and time. Coordination normal.  Skin: Skin is warm and dry.  Psychiatric: She has  a normal mood and affect.   Filed Vitals:   08/04/15 1349  BP: 132/84  Pulse: 86  Temp: 98.3 F (36.8 C)  TempSrc: Oral  Resp: 16  Height: 5\' 7"  (1.702 m)  Weight: 254 lb (115.214 kg)  SpO2: 95%      Assessment & Plan:  Flu shot given at visit.

## 2015-08-05 NOTE — Assessment & Plan Note (Signed)
BP at goal and will switch lisinopril with losartan for her cough. Continue hctz. No labs today. Labs with next visit.

## 2015-08-05 NOTE — Assessment & Plan Note (Signed)
Likely to be related to her ACE-I. Will stop lisinopril/hctz and start losartan/hctz.

## 2015-08-28 ENCOUNTER — Telehealth: Payer: Self-pay | Admitting: Internal Medicine

## 2015-08-28 ENCOUNTER — Other Ambulatory Visit: Payer: Self-pay | Admitting: Internal Medicine

## 2015-08-28 DIAGNOSIS — C189 Malignant neoplasm of colon, unspecified: Secondary | ICD-10-CM

## 2015-08-28 DIAGNOSIS — D649 Anemia, unspecified: Secondary | ICD-10-CM

## 2015-08-28 MED ORDER — METFORMIN HCL 1000 MG PO TABS: 1000.0000 mg | ORAL_TABLET | Freq: Two times a day (BID) | ORAL | Status: DC

## 2015-08-28 NOTE — Telephone Encounter (Signed)
error 

## 2015-09-15 ENCOUNTER — Other Ambulatory Visit: Payer: Medicare Other

## 2015-09-15 ENCOUNTER — Encounter: Payer: Medicare Other | Admitting: Genetic Counselor

## 2015-10-08 DIAGNOSIS — E039 Hypothyroidism, unspecified: Secondary | ICD-10-CM | POA: Diagnosis not present

## 2015-10-08 DIAGNOSIS — E1142 Type 2 diabetes mellitus with diabetic polyneuropathy: Secondary | ICD-10-CM | POA: Diagnosis not present

## 2015-10-08 DIAGNOSIS — Z794 Long term (current) use of insulin: Secondary | ICD-10-CM | POA: Diagnosis not present

## 2015-10-08 DIAGNOSIS — E049 Nontoxic goiter, unspecified: Secondary | ICD-10-CM | POA: Diagnosis not present

## 2015-10-08 DIAGNOSIS — R252 Cramp and spasm: Secondary | ICD-10-CM | POA: Diagnosis not present

## 2015-10-13 ENCOUNTER — Telehealth: Payer: Self-pay | Admitting: Cardiology

## 2015-10-13 ENCOUNTER — Ambulatory Visit (INDEPENDENT_AMBULATORY_CARE_PROVIDER_SITE_OTHER): Payer: Medicare Other | Admitting: *Deleted

## 2015-10-13 DIAGNOSIS — I442 Atrioventricular block, complete: Secondary | ICD-10-CM

## 2015-10-13 NOTE — Progress Notes (Signed)
Remote pacemaker transmission.   

## 2015-10-13 NOTE — Telephone Encounter (Signed)
Spoke with pt and reminded pt of remote transmission that is due today. Pt verbalized understanding.   

## 2015-10-15 ENCOUNTER — Encounter: Payer: Self-pay | Admitting: Oncology

## 2015-10-15 ENCOUNTER — Encounter: Payer: Self-pay | Admitting: Cardiology

## 2015-10-15 LAB — CUP PACEART REMOTE DEVICE CHECK
Battery Remaining Percentage: 95.5 %
Battery Voltage: 3.01 V
Brady Statistic AP VP Percent: 15 %
Brady Statistic AS VP Percent: 85 %
Date Time Interrogation Session: 20170213164548
Implantable Lead Implant Date: 20150506
Implantable Lead Location: 753859
Lead Channel Impedance Value: 380 Ohm
Lead Channel Sensing Intrinsic Amplitude: 12 mV
Lead Channel Setting Pacing Pulse Width: 0.4 ms
MDC IDC LEAD IMPLANT DT: 20150506
MDC IDC LEAD LOCATION: 753860
MDC IDC MSMT BATTERY REMAINING LONGEVITY: 118 mo
MDC IDC MSMT LEADCHNL RA SENSING INTR AMPL: 0.7 mV
MDC IDC MSMT LEADCHNL RV IMPEDANCE VALUE: 450 Ohm
MDC IDC MSMT LEADCHNL RV PACING THRESHOLD AMPLITUDE: 0.625 V
MDC IDC MSMT LEADCHNL RV PACING THRESHOLD PULSEWIDTH: 0.4 ms
MDC IDC SET LEADCHNL RA PACING AMPLITUDE: 2.5 V
MDC IDC SET LEADCHNL RV PACING AMPLITUDE: 0.875
MDC IDC SET LEADCHNL RV SENSING SENSITIVITY: 4 mV
MDC IDC STAT BRADY AP VS PERCENT: 1 %
MDC IDC STAT BRADY AS VS PERCENT: 1 %
MDC IDC STAT BRADY RA PERCENT PACED: 14 %
MDC IDC STAT BRADY RV PERCENT PACED: 99 %
Pulse Gen Model: 2240
Pulse Gen Serial Number: 3013730

## 2015-11-10 ENCOUNTER — Telehealth: Payer: Self-pay | Admitting: Oncology

## 2015-11-10 NOTE — Telephone Encounter (Signed)
Due to call day moved 3/30 lab/fu to 4/14. Left message for patient re change and mailed schedule.

## 2015-11-25 ENCOUNTER — Other Ambulatory Visit: Payer: Self-pay | Admitting: Geriatric Medicine

## 2015-11-25 ENCOUNTER — Telehealth: Payer: Self-pay | Admitting: Internal Medicine

## 2015-11-25 MED ORDER — BUPROPION HCL ER (XL) 150 MG PO TB24: 150.0000 mg | ORAL_TABLET | Freq: Every day | ORAL | Status: DC

## 2015-11-25 NOTE — Telephone Encounter (Signed)
Sent to pharmacy 

## 2015-11-25 NOTE — Telephone Encounter (Signed)
Pt request refill for buPROPion (WELLBUTRIN XL) 150 MG to be send to [facility].

## 2015-11-27 ENCOUNTER — Other Ambulatory Visit: Payer: Medicare Other

## 2015-11-27 ENCOUNTER — Ambulatory Visit: Payer: Medicare Other | Admitting: Oncology

## 2015-12-10 ENCOUNTER — Other Ambulatory Visit: Payer: Self-pay | Admitting: *Deleted

## 2015-12-10 MED ORDER — APIXABAN 5 MG PO TABS: 5.0000 mg | ORAL_TABLET | Freq: Two times a day (BID) | ORAL | Status: DC

## 2015-12-11 DIAGNOSIS — Z794 Long term (current) use of insulin: Secondary | ICD-10-CM | POA: Diagnosis not present

## 2015-12-11 DIAGNOSIS — H401114 Primary open-angle glaucoma, right eye, indeterminate stage: Secondary | ICD-10-CM | POA: Diagnosis not present

## 2015-12-11 DIAGNOSIS — E119 Type 2 diabetes mellitus without complications: Secondary | ICD-10-CM | POA: Diagnosis not present

## 2015-12-11 DIAGNOSIS — Z961 Presence of intraocular lens: Secondary | ICD-10-CM | POA: Diagnosis not present

## 2015-12-11 DIAGNOSIS — Z83511 Family history of glaucoma: Secondary | ICD-10-CM | POA: Diagnosis not present

## 2015-12-12 ENCOUNTER — Ambulatory Visit: Payer: Medicare Other | Admitting: Oncology

## 2015-12-12 ENCOUNTER — Other Ambulatory Visit: Payer: Medicare Other

## 2015-12-17 ENCOUNTER — Telehealth: Payer: Self-pay | Admitting: Oncology

## 2015-12-17 NOTE — Telephone Encounter (Signed)
pt cld to r/s appt-gave pt r/s time & date of appt

## 2016-01-08 ENCOUNTER — Ambulatory Visit (HOSPITAL_BASED_OUTPATIENT_CLINIC_OR_DEPARTMENT_OTHER): Payer: Medicare Other | Admitting: Oncology

## 2016-01-08 ENCOUNTER — Other Ambulatory Visit (HOSPITAL_BASED_OUTPATIENT_CLINIC_OR_DEPARTMENT_OTHER): Payer: Medicare Other

## 2016-01-08 VITALS — BP 146/71 | HR 62 | Temp 98.3°F | Resp 16 | Ht 67.0 in | Wt 251.2 lb

## 2016-01-08 DIAGNOSIS — Z86718 Personal history of other venous thrombosis and embolism: Secondary | ICD-10-CM | POA: Diagnosis not present

## 2016-01-08 DIAGNOSIS — I10 Essential (primary) hypertension: Secondary | ICD-10-CM | POA: Diagnosis not present

## 2016-01-08 DIAGNOSIS — Z85038 Personal history of other malignant neoplasm of large intestine: Secondary | ICD-10-CM | POA: Diagnosis not present

## 2016-01-08 DIAGNOSIS — G620 Drug-induced polyneuropathy: Secondary | ICD-10-CM | POA: Diagnosis not present

## 2016-01-08 DIAGNOSIS — E039 Hypothyroidism, unspecified: Secondary | ICD-10-CM | POA: Diagnosis not present

## 2016-01-08 DIAGNOSIS — C189 Malignant neoplasm of colon, unspecified: Secondary | ICD-10-CM | POA: Diagnosis not present

## 2016-01-08 DIAGNOSIS — I480 Paroxysmal atrial fibrillation: Secondary | ICD-10-CM

## 2016-01-08 DIAGNOSIS — E1142 Type 2 diabetes mellitus with diabetic polyneuropathy: Secondary | ICD-10-CM | POA: Diagnosis not present

## 2016-01-08 DIAGNOSIS — C184 Malignant neoplasm of transverse colon: Secondary | ICD-10-CM

## 2016-01-08 NOTE — Progress Notes (Signed)
  [place] OFFICE PROGRESS NOTE   Diagnosis: Colon cancer  INTERVAL HISTORY:   Mary [name] returns as scheduled. She feels well. She continues smoking. She has irregular bowel habits, no bleeding. She reports persistent neuropathy symptoms after receiving oxaliplatin chemotherapy. She did not go to the genetics screening clinic since her insurance would not cover this.  Objective:  Vital signs in last 24 hours:  Blood pressure 146/71, pulse 62, temperature 98.3 F (36.8 C), temperature source Oral, resp. rate 16, height 5\' 7"  (1.702 m), weight 251 lb 3.2 oz (113.944 kg), SpO2 98 %.    HEENT: Neck without mass Lymphatics: No cervical, supra-clavicular, axillary, or inguinal nodes Resp: Distant breath sounds, no respiratory distress Cardio: Distant heart sounds, regular rate and rhythm GI: No hepatomegaly, no mass, nontender Vascular: No leg edema   Lab Results:  CEA pending Medications: I have reviewed the patient's current medications.  Assessment/Plan: 1. Stage III (pT3,pN1c,M0) adenocarcinoma transverse colon, status post a laparoscopic left colectomy 11/30/2010  -12 cycles of FOLFOX chemotherapy initiated on 12/23/2010, oxaliplatin was held after? Cycle 7 of chemotherapy secondary to neuropathy  -Surveillance colonoscopy March 2013, removal of a rectal tubular adenoma  -Negative surveillance CT scans 12/12/2012  -Negative abdomen CT 06/11/2014 -Negative surveillance colonoscopy 10/02/2014 2. History of iron deficiency anemia secondary gastric bypass surgery, status post Venofer, last given 07/03/2012  3. Gastric bypass surgery in 2009  4. Diabetes  5. Hypertension  6. 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 Extensive family history of cancer  9. Paroxysmal atrial fibrillation and Mobitz 1 second degree AV block  10. Hypothyroidism     Disposition:  Mary [name] remains in clinical remission from colon cancer. We will follow-up on the CEA from today. I encouraged her to discontinue smoking. She will alert family members of the need to receive appropriate screening for colon cancer. I encouraged her to follow-up with the genetics counselor.  She will continue clinical follow-up with Dr. Sharlet Salina and surveillance colonoscopies with Dr. Henrene Pastor. She is not scheduled for a follow-up appointment at the Cancer center. We will see her in the future as needed.    [name], MD  01/08/2016  8:55 AM

## 2016-01-09 LAB — CEA (PARALLEL TESTING): CEA: 2.2 ng/mL

## 2016-01-09 LAB — CEA: CEA: 3 ng/mL (ref 0.0–4.7)

## 2016-01-13 ENCOUNTER — Encounter: Payer: Self-pay | Admitting: Oncology

## 2016-01-19 ENCOUNTER — Encounter: Payer: Self-pay | Admitting: Interventional Cardiology

## 2016-01-19 ENCOUNTER — Encounter: Payer: Self-pay | Admitting: Nurse Practitioner

## 2016-01-19 NOTE — Progress Notes (Signed)
This encounter was created in error - please disregard.

## 2016-01-20 ENCOUNTER — Encounter: Payer: Medicare Other | Admitting: Nurse Practitioner

## 2016-01-29 ENCOUNTER — Ambulatory Visit: Payer: Medicare Other | Admitting: Interventional Cardiology

## 2016-02-03 ENCOUNTER — Ambulatory Visit: Payer: Medicare Other | Admitting: Internal Medicine

## 2016-02-05 ENCOUNTER — Encounter: Payer: Medicare Other | Admitting: Nurse Practitioner

## 2016-02-09 DIAGNOSIS — H401131 Primary open-angle glaucoma, bilateral, mild stage: Secondary | ICD-10-CM | POA: Diagnosis not present

## 2016-02-26 ENCOUNTER — Encounter: Payer: Self-pay | Admitting: Interventional Cardiology

## 2016-03-01 ENCOUNTER — Telehealth: Payer: Self-pay

## 2016-03-01 NOTE — Telephone Encounter (Signed)
Called pt after receiving my chart message. Pt is upset because her appt with Dr.Smith has not been rescheduled. lmtcb

## 2016-03-04 ENCOUNTER — Ambulatory Visit: Payer: Medicare Other | Admitting: Internal Medicine

## 2016-03-11 ENCOUNTER — Ambulatory Visit (INDEPENDENT_AMBULATORY_CARE_PROVIDER_SITE_OTHER): Payer: Medicare Other | Admitting: Nurse Practitioner

## 2016-03-11 ENCOUNTER — Encounter: Payer: Self-pay | Admitting: Nurse Practitioner

## 2016-03-11 VITALS — BP 156/72 | HR 77 | Ht 67.0 in | Wt 249.8 lb

## 2016-03-11 DIAGNOSIS — I25118 Atherosclerotic heart disease of native coronary artery with other forms of angina pectoris: Secondary | ICD-10-CM

## 2016-03-11 DIAGNOSIS — I441 Atrioventricular block, second degree: Secondary | ICD-10-CM

## 2016-03-11 DIAGNOSIS — I480 Paroxysmal atrial fibrillation: Secondary | ICD-10-CM

## 2016-03-11 DIAGNOSIS — I10 Essential (primary) hypertension: Secondary | ICD-10-CM

## 2016-03-11 LAB — CUP PACEART INCLINIC DEVICE CHECK
Date Time Interrogation Session: 20170713133725
Implantable Lead Implant Date: 20150506
Implantable Lead Implant Date: 20150506
Implantable Lead Location: 753859
MDC IDC LEAD LOCATION: 753860
Pulse Gen Model: 2240
Pulse Gen Serial Number: 3013730

## 2016-03-11 NOTE — Patient Instructions (Signed)
Medication Instructions:   Your physician recommends that you continue on your current medications as directed. Please refer to the Current Medication list given to you today.   If you need a refill on your cardiac medications before your next appointment, please call your pharmacy.  Labwork: NONE ORDER TODAY    Testing/Procedures: NONE ORDER TODAY    Follow-Up:  Your physician wants you to follow-up in: [place] will receive a reminder letter in the mail two months in advance. If you don't receive a letter, please call our office to schedule the follow-up appointment.  WITH DR [name] AS SCHEDULED   Remote monitoring is used to monitor your Pacemaker of ICD from home. This monitoring reduces the number of office visits required to check your device to one time per year. It allows [place] to keep an eye on the functioning of your device to ensure it is working properly. You are scheduled for a device check from home on . 10 /13/2017  You may send your transmission at any time that day. If you have a wireless device, the transmission will be sent automatically. After your physician reviews your transmission, you will receive a postcard with your next transmission date.   Any Other Special Instructions Will Be Listed Below (If Applicable).

## 2016-03-11 NOTE — Progress Notes (Signed)
Electrophysiology Office Note Date: 03/11/2016  ID:  Mary [name], DOB 09/23/1940, MRN [id number]  PCP: Hoyt Koch, MD Primary Cardiologist: [name] Electrophysiologist: Rayann Heman  CC: Pacemaker follow-up  Mary [name] is a 75 y.o. female seen today for Dr Rayann Heman.  She presents today for routine electrophysiology followup.  Since last being seen in our clinic, the patient reports doing reasonably well.  She has persistent chest pressure that has been unchaged for the last year, dyspnea on exertion anxiety, depression, edema, and balance problems.  She is largely unaware of AF. She denies palpitations, nausea, vomiting, dizziness, syncope, weight gain, or early satiety.  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 Hypertension   . CAD (coronary artery disease)     non obstructive  . Hyperlipidemia   . Hypothyroidism   . DVT (deep venous thrombosis) ([place]) 2013    "twice behind knee on left side" (06/25/2013)  . Sleep apnea     a. resolved post weight loss   . Iron deficiency anemia   . Arthritis   . Chronic lower back pain   . Colon cancer ([place])     a. s/p chemo  . Multinodular goiter   . Colon polyps   . CHF (congestive heart failure) ([place])   . Glaucoma   . Type II diabetes mellitus ([place])   . Diabetic peripheral neuropathy ([place])   . Carotid stenosis     Carotid US 5/16:  Bilateral ICA 1-39%; L vertebral retrograde; L BP 126/49, R BP 140/57   Past Surgical History  Procedure Laterality Date  . Roux-en-y gastric bypass  2009  . Cardiac catheterization  2006    Never had PCI, 3 caths total. Last one in [place]  . Cholecystectomy  1980's  . Umbilical hernia repair  1970's?     (06/25/2013)  . Ulnar tunnel release Left ~ 2008  . Cataract extraction, bilateral Bilateral 2013    "and put stent in my left eye for glaucoma" (06/25/2013)  . Eye muscle surgery Bilateral ~ 1963    "muscles too long;  eyes would go out and up; tied muscles to hold my eyes straight" (06/25/2013)  . Portacath placement Left 11/2010  . Colectomy  10/2010    Tumor removal  . Pacemaker insertion  01/02/2014    STJ Assurity dual chamber pacemaker implnated by Dr Lovena Le for SSS  . Lead revision  01-03-2014    atrial lead revision by Dr Rayann Heman  . Permanent pacemaker insertion N/A 01/02/2014    Procedure: PERMANENT PACEMAKER INSERTION;  Surgeon: Evans Lance, MD;  Location: [facility] C[facility] LAB;  Service: Cardiovascular;  Laterality: N/A;  . Lead revision N/A 01/03/2014    Procedure: LEAD REVISION;  Surgeon: Coralyn Mark, MD;  Location: [facility] CATH LAB;  Service: Cardiovascular;  Laterality: N/A;  . Cardiac catheterization N/A 02/04/2015    Procedure: Left Heart Cath and Coronary Angiography;  Surgeon: Belva Crome, MD;  Location: [facility] CV LAB;  Service: Cardiovascular;  Laterality: N/A;    Current Outpatient Prescriptions  Medication Sig Dispense Refill  . apixaban (ELIQUIS) 5 MG TABS tablet Take 1 tablet (5 mg total) by mouth 2 (two) times daily. 180 tablet 0  . atorvastatin (LIPITOR) 80 MG tablet take 1 tablet by mouth once daily 30 tablet 6  . bimatoprost (LUMIGAN) 0.01 % SOLN Place 1 drop into the right eye at bedtime. Right eye  only    . buPROPion (WELLBUTRIN XL) 150 MG 24 hr tablet Take 1 tablet (150 mg total) by mouth daily. 30 tablet 6  . Calcium Citrate-Vitamin D (CITRACAL + D PO) Take 1 tablet by mouth 2 (two) times daily.    . Choline Fenofibrate (FENOFIBRIC ACID) 135 MG CPDR Take 1 capsule by mouth at bedtime. 30 capsule 6  . gabapentin (NEURONTIN) 300 MG capsule Take 300 mg by mouth 2 (two) times daily.     Marland Kitchen HYDROcodone-acetaminophen (NORCO/VICODIN) 5-325 MG tablet Take 1 tablet by mouth every 6 (six) hours as needed for moderate pain. 30 tablet 0  . LANTUS SOLOSTAR 100 UNIT/ML Solostar Pen inject 32 units subcutaneously once daily at bedtime ; TITRATE dose as directed  0  . levothyroxine (SYNTHROID,  LEVOTHROID) 112 MCG tablet Take 112 mcg by mouth daily before breakfast.    . losartan-hydrochlorothiazide (HYZAAR) 100-25 MG tablet Take 1 tablet by mouth daily. 90 tablet 3  . metFORMIN (GLUCOPHAGE) 1000 MG tablet Take 1 tablet (1,000 mg total) by mouth 2 (two) times daily with a meal. 180 tablet 3  . nitroGLYCERIN (NITROSTAT) 0.4 MG SL tablet Place 1 tablet (0.4 mg total) under the tongue every 5 (five) minutes as needed for chest pain. 90 tablet 3   No current facility-administered medications for this visit.    Allergies:   Other; Adhesive; Aspirin; Latex; and Nsaids   Social History: Social History   Social History  . Marital Status: Married    Spouse Name: Mary [name]  . Number of Children: 0  . Years of Education: N/A   Occupational History  . Retired     office work   Social History Main Topics  . Smoking status: Light Tobacco Smoker -- 1.00 packs/day for 30 years    Types: Cigarettes    Last Attempt to Quit: 05/03/2013  . Smokeless tobacco: Never Used     Comment: 06/25/2013 "smoked off and on since I was 22; stopped 10/2007 til 08/30/2012 then started again cause of stress"  . Alcohol Use: 0.0 oz/week    0 Standard drinks or equivalent per week     Comment: 06/25/2013 "glass of wine 1-2 times/yr"  . Drug Use: No  . Sexual Activity: Yes   Other Topics Concern  . Not on file   Social History Narrative   Married to husband, Mary [name]   No children   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   Recently moved back here from [place]     Family History: Family History  Problem Relation Age of Onset  . Breast cancer Mother   . Heart disease Father   . Heart attack Father   . Heart attack Sister   . Heart attack Brother   . Breast cancer Sister   . Breast cancer Maternal Aunt     x 2 aunts  . Pancreatic cancer Brother   . Prostate cancer Brother   . Colon polyps Brother   . Bladder Cancer Cousin   . Clotting disorder Sister   . Diabetes Sister      x 3  . Diabetes Mother   . Diabetes Maternal Grandmother      Review of Systems: All other systems reviewed and are otherwise negative except as noted above.   Physical Exam: VS:  BP 156/72 mmHg  Pulse 77  Ht 5\' 7"  (1.702 m)  Wt 249 lb 12.8 oz (113.309 kg)  BMI 39.12 kg/m2 , BMI Body mass index  is 39.12 kg/(m^2).  GEN- The patient is elderly and morbidly obese appearing, alert and oriented x 3 today.   HEENT: normocephalic, atraumatic; sclera clear, conjunctiva pink; hearing intact; oropharynx clear; neck supple Lungs- normal work of breathing, scattered rhonchi  Heart- Regular rate and rhythm, no murmurs, rubs or gallops GI- soft, non-tender, non-distended, bowel sounds present Extremities- no clubbing, cyanosis, or edema; DP/PT/radial pulses 2+ bilaterally MS- no significant deformity or atrophy Skin- warm and dry, no rash or lesion; PPM pocket well healed Psych- euthymic mood, full affect Neuro- strength and sensation are intact  PPM Interrogation- reviewed in detail today,  See PACEART report  EKG:  EKG is ordered today. The ekg ordered today shows sinus rhythm with ventricular pacing   Recent Labs: No results found for requested labs within last 365 days.   Wt Readings from Last 3 Encounters:  03/11/16 249 lb 12.8 oz (113.309 kg)  01/08/16 251 lb 3.2 oz (113.944 kg)  08/04/15 254 lb (115.214 kg)     Other studies Reviewed: Additional studies/ records that were reviewed today include: Dr [name] and Dr Jackalyn Lombard office notes  Assessment and Plan:  1.  Mobitz II Normal PPM function See Pace Art report No changes today  2.  Paroxysmal atrial fibrillation Burden by device interrogation today <1% Continue Eliquis for CHADS2VASC of 5 Recent CBC, BMET stable  3.  HTN Elevated She is seeing Dr [name] next week. Will defer med changes to him   4.  CAD with stable angina Last cath 2016 with no targets for revascularization She has ongoing stable angina and  dyspnea on exertion Will defer to Dr [name] addition of Imdur for blood pressure and treatment of angina We discussed risk factor modification today including need for weight loss and tobacco cessation. She is smoking 1 PPD currently.  She and her husband are trying to quit.   Current medicines are reviewed at length with the patient today.   The patient does not have concerns regarding her medicines.  The following changes were made today:  none  Labs/ tests ordered today include: none  No orders of the defined types were placed in this encounter.     Disposition:   Follow up with Delilah Shan, Dr [name] as scheduled, Dr Rayann Heman in 1 year    Signed, Chanetta Marshall, NP 03/11/2016 1:27 PM  [facility] [address] [place] [address] [phone] (office) [phone] (fax)

## 2016-03-19 ENCOUNTER — Ambulatory Visit (INDEPENDENT_AMBULATORY_CARE_PROVIDER_SITE_OTHER): Payer: Medicare Other | Admitting: Interventional Cardiology

## 2016-03-19 ENCOUNTER — Encounter: Payer: Self-pay | Admitting: Interventional Cardiology

## 2016-03-19 VITALS — BP 134/60 | HR 67 | Ht 67.0 in | Wt 248.8 lb

## 2016-03-19 DIAGNOSIS — I480 Paroxysmal atrial fibrillation: Secondary | ICD-10-CM

## 2016-03-19 DIAGNOSIS — I2510 Atherosclerotic heart disease of native coronary artery without angina pectoris: Secondary | ICD-10-CM

## 2016-03-19 DIAGNOSIS — I10 Essential (primary) hypertension: Secondary | ICD-10-CM | POA: Diagnosis not present

## 2016-03-19 DIAGNOSIS — Z7901 Long term (current) use of anticoagulants: Secondary | ICD-10-CM

## 2016-03-19 DIAGNOSIS — Z950 Presence of cardiac pacemaker: Secondary | ICD-10-CM | POA: Diagnosis not present

## 2016-03-19 NOTE — Patient Instructions (Signed)
Medication Instructions:  Your physician recommends that you continue on your current medications as directed. Please refer to the Current Medication list given to you today.   Labwork: None ordered  Testing/Procedures: None ordered  Follow-Up: Your physician wants you to follow-up in: 1 year with Dr.Smith You will receive a reminder letter in the mail two months in advance. If you don't receive a letter, please call our office to schedule the follow-up appointment.   Any Other Special Instructions Will Be Listed Below (If Applicable). For reoccurring chest pian use Nitro-glycerin. Got to the emergency room for chest pain not relieved with Nitro  STOP SMOKING Your physician discussed the hazards of tobacco use. Tobacco use cessation is recommended and techniques and options to help you quit were discussed.       If you need a refill on your cardiac medications before your next appointment, please call your pharmacy.

## 2016-03-19 NOTE — Progress Notes (Signed)
Cardiology Office Note    Date:  03/19/2016   ID:  Mary [name], DOB 1940-11-19, MRN [id number]  PCP:  Hoyt Koch, MD  Cardiologist: Sinclair Grooms, MD   Chief Complaint  Patient presents with  . Hospitalization Follow-up    Pacemaker  . Atrial Fibrillation    History of Present Illness:  Mary [name] is a 75 y.o. female with seen in follow-up for paroxysmal atrial fibrillation, AV node/tachybradycardia syndrome, permanent pacemaker, hypertension, hyperlipidemia, and coronary artery disease.  The patient has been under a lot of stress at home. This morning she began having midsternal discomfort that was very similar to that which occurred during hospitalization last summer. At that time she underwent coronary angiography with results outlined below. She had disease involving branches without any options for PCI. By the time he got to the office it was still lingering chest discomfort. An EKG was performed and showed no evidence of acute EKG abnormality. A pace left ventricular rhythm was noted with tracking of the atrium. At the time of exam and interview she is completely pain-free. Entire episode lasted less than 20 minutes.  Past Medical History  Diagnosis Date  . Hypertension   . CAD (coronary artery disease)     non obstructive  . Hyperlipidemia   . Hypothyroidism   . DVT (deep venous thrombosis) ([place]) 2013    "twice behind knee on left side" (06/25/2013)  . Sleep apnea     a. resolved post weight loss   . Iron deficiency anemia   . Arthritis   . Chronic lower back pain   . Colon cancer ([place])     a. s/p chemo  . Multinodular goiter   . Colon polyps   . CHF (congestive heart failure) ([place])   . Glaucoma   . Type II diabetes mellitus ([place])   . Diabetic peripheral neuropathy ([place])   . Carotid stenosis     Carotid US 5/16:  Bilateral ICA 1-39%; L vertebral retrograde; L BP 126/49, R BP 140/57    Past Surgical History  Procedure Laterality Date  .  Roux-en-y gastric bypass  2009  . Cardiac catheterization  2006    Never had PCI, 3 caths total. Last one in [place]  . Cholecystectomy  1980's  . Umbilical hernia repair  1970's?     (06/25/2013)  . Ulnar tunnel release Left ~ 2008  . Cataract extraction, bilateral Bilateral 2013    "and put stent in my left eye for glaucoma" (06/25/2013)  . Eye muscle surgery Bilateral ~ 1963    "muscles too long; eyes would go out and up; tied muscles to hold my eyes straight" (06/25/2013)  . Portacath placement Left 11/2010  . Colectomy  10/2010    Tumor removal  . Pacemaker insertion  01/02/2014    STJ Assurity dual chamber pacemaker implnated by Dr Lovena Le for SSS  . Lead revision  01-03-2014    atrial lead revision by Dr Rayann Heman  . Permanent pacemaker insertion N/A 01/02/2014    Procedure: PERMANENT PACEMAKER INSERTION;  Surgeon: Evans Lance, MD;  Location: [facility] CATH LAB;  Service: Cardiovascular;  Laterality: N/A;  . Lead revision N/A 01/03/2014    Procedure: LEAD REVISION;  Surgeon: Coralyn Mark, MD;  Location: [facility] CATH LAB;  Service: Cardiovascular;  Laterality: N/A;  . Cardiac catheterization N/A 02/04/2015    Procedure: Left Heart Cath and Coronary Angiography;  Surgeon: Belva Crome, MD;  Location: [facility] CV LAB;  Service: Cardiovascular;  Laterality: N/A;    Current Medications: Outpatient Prescriptions Prior to Visit  Medication Sig Dispense Refill  . apixaban (ELIQUIS) 5 MG TABS tablet Take 1 tablet (5 mg total) by mouth 2 (two) times daily. 180 tablet 0  . atorvastatin (LIPITOR) 80 MG tablet take 1 tablet by mouth once daily 30 tablet 6  . bimatoprost (LUMIGAN) 0.01 % SOLN Place 1 drop into the right eye at bedtime. Right eye only    . buPROPion (WELLBUTRIN XL) 150 MG 24 hr tablet Take 1 tablet (150 mg total) by mouth daily. 30 tablet 6  . Calcium Citrate-Vitamin D (CITRACAL + D PO) Take 1 tablet by mouth 2 (two) times daily.    . Choline Fenofibrate (FENOFIBRIC ACID) 135 MG CPDR Take  1 capsule by mouth at bedtime. 30 capsule 6  . gabapentin (NEURONTIN) 300 MG capsule Take 300 mg by mouth 2 (two) times daily.     Marland Kitchen HYDROcodone-acetaminophen (NORCO/VICODIN) 5-325 MG tablet Take 1 tablet by mouth every 6 (six) hours as needed for moderate pain. 30 tablet 0  . LANTUS SOLOSTAR 100 UNIT/ML Solostar Pen inject 32 units subcutaneously once daily at bedtime ; TITRATE dose as directed  0  . levothyroxine (SYNTHROID, LEVOTHROID) 112 MCG tablet Take 112 mcg by mouth daily before breakfast.    . losartan-hydrochlorothiazide (HYZAAR) 100-25 MG tablet Take 1 tablet by mouth daily. 90 tablet 3  . metFORMIN (GLUCOPHAGE) 1000 MG tablet Take 1 tablet (1,000 mg total) by mouth 2 (two) times daily with a meal. 180 tablet 3  . nitroGLYCERIN (NITROSTAT) 0.4 MG SL tablet Place 1 tablet (0.4 mg total) under the tongue every 5 (five) minutes as needed for chest pain. 90 tablet 3   No facility-administered medications prior to visit.     Allergies:   Other; Adhesive; Aspirin; Latex; and Nsaids   Social History   Social History  . Marital Status: Married    Spouse Name: [name]  . Number of Children: 0  . Years of Education: N/A   Occupational History  . Retired     office work   Social History Main Topics  . Smoking status: Light Tobacco Smoker -- 1.00 packs/day for 30 years    Types: Cigarettes    Last Attempt to Quit: 05/03/2013  . Smokeless tobacco: Never Used     Comment: 06/25/2013 "smoked off and on since I was 22; stopped 10/2007 til 08/30/2012 then started again cause of stress"  . Alcohol Use: 0.0 oz/week    0 Standard drinks or equivalent per week     Comment: 06/25/2013 "glass of wine 1-2 times/yr"  . Drug Use: No  . Sexual Activity: Yes   Other Topics Concern  . 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   ROS:   Please see the history of present illness.    Continues to smoke heavily. Under much stress at home due to  illness of her brother who has recurrent cancer. Chronic back pain, dizziness, cough, dyspnea on exertion, leg swelling, unexplained weight gain, and occasional palpitations. There were no palpitations today.  All other systems reviewed and are negative.   PHYSICAL EXAM:   VS:  BP 134/60 mmHg  Pulse 67  Ht 5\' 7"  (1.702 m)  Wt 248 lb 12.8 oz (112.855 kg)  BMI 38.96 kg/m2   GEN: Well nourished, well developed, in no acute distress HEENT: normal Neck: no JVD, carotid bruits, or masses Cardiac: RRR; no murmurs, rubs, or gallops,no edema  Respiratory:  clear to auscultation bilaterally, normal work of breathing GI: soft, nontender, nondistended, + BS MS: no deformity or atrophy Skin: warm and dry, no rash Neuro:  Alert and Oriented x 3, Strength and sensation are intact Psych: euthymic mood, full affect  Wt Readings from Last 3 Encounters:  03/19/16 248 lb 12.8 oz (112.855 kg)  03/11/16 249 lb 12.8 oz (113.309 kg)  01/08/16 251 lb 3.2 oz (113.944 kg)      Studies/Labs Reviewed:   EKG:  EKG  EKG done acutely during chest pain reveals a ventricular paced rhythm with atrial tracking at a rate of 67 bpm and without any acute ST-T change or difference when compared to 03/11/16 and 01/28/15.  Recent Labs: No results found for requested labs within last 365 days.   Lipid Panel    Component Value Date/Time   CHOL 183 01/28/2015 0525   TRIG 128 01/28/2015 0525   HDL 43 01/28/2015 0525   CHOLHDL  4.3 01/28/2015 0525   VLDL 26 01/28/2015 0525   LDLCALC 114* 01/28/2015 0525    Additional studies/ records that were reviewed today include:  Coronary angiography 02/04/15           Diffuse, intramyocardial distal LAD with up to 80% stenosis is noted.  The mid RCA, proximal LAD, and second obtuse marginal contains less than 50% narrowing.  The small PDA/posterolateral branch contains ostial 80-90% narrowing  Normal left ventricular function with EF estimated at 60%   ASSESSMENT:    1. Paroxysmal atrial fibrillation (HCC)   2. Coronary artery disease involving native coronary artery of native heart without angina pectoris   3. Cardiac pacemaker in situ   4. Essential hypertension   5. Long term current use of anticoagulant therapy      PLAN:  In order of problems listed above:   No clinical evidence of recurrent atrial fibrillation.  Chest pain on presentation today possibly angina. It was relieved by nitroglycerin. No acute EKG changes noted. Total episode less than 20 minutes. Similar discomfort one year ago when heart catheterization was performed. Decided against admission to the hospital. Counseled to come to the emergency room if recurring episodes of discomfort unrelieved by nitroglycerin. Counseled to discontinue cigarette smoking.  Normal pacemaker function based upon today's EKG  Well-controlled blood pressure. Low salt diet. Smoking cessation.  No bleeding complications on apixaban  Clinical follow-up in one year    Medication Adjustments/Labs and Tests Ordered: Current medicines are reviewed at length with the patient today.  Concerns regarding medicines are outlined above.  Medication changes, Labs and Tests ordered today are listed in the Patient Instructions below. Patient Instructions  Medication Instructions:  Your physician recommends that you continue on your current medications as directed. Please refer to the Current Medication list given to  you today.   Labwork: None ordered  Testing/Procedures: None ordered  Follow-Up: Your physician wants you to follow-up in: 1 year  with Dr.Smith You will receive a reminder letter in the mail two months in advance. If you don't receive a letter, please call our office to schedule the follow-up appointment.   Any Other Special Instructions Will Be Listed Below (If Applicable). For reoccurring chest pian use Nitro-glycerin. Got to the emergency room for chest pain not relieved with Nitro  STOP SMOKING Your physician discussed the hazards of tobacco use. Tobacco use cessation is recommended and techniques and options to help you quit were discussed.       If you need a refill on your cardiac medications before your next appointment, please call your pharmacy.       Signed, Sinclair Grooms, MD  03/19/2016 12:25 PM    [facility] [place], [place], [place]  [address] Phone: [phone]; Fax: [phone]

## 2016-03-23 ENCOUNTER — Encounter: Payer: Self-pay | Admitting: Internal Medicine

## 2016-03-23 ENCOUNTER — Ambulatory Visit (INDEPENDENT_AMBULATORY_CARE_PROVIDER_SITE_OTHER): Payer: Medicare Other | Admitting: Internal Medicine

## 2016-03-23 ENCOUNTER — Other Ambulatory Visit (INDEPENDENT_AMBULATORY_CARE_PROVIDER_SITE_OTHER): Payer: Medicare Other

## 2016-03-23 VITALS — BP 122/78 | HR 80 | Temp 98.2°F | Resp 18 | Ht 67.0 in | Wt 249.8 lb

## 2016-03-23 DIAGNOSIS — I10 Essential (primary) hypertension: Secondary | ICD-10-CM | POA: Diagnosis not present

## 2016-03-23 DIAGNOSIS — I2510 Atherosclerotic heart disease of native coronary artery without angina pectoris: Secondary | ICD-10-CM | POA: Diagnosis not present

## 2016-03-23 DIAGNOSIS — E669 Obesity, unspecified: Secondary | ICD-10-CM

## 2016-03-23 DIAGNOSIS — E785 Hyperlipidemia, unspecified: Secondary | ICD-10-CM

## 2016-03-23 DIAGNOSIS — E118 Type 2 diabetes mellitus with unspecified complications: Secondary | ICD-10-CM | POA: Diagnosis not present

## 2016-03-23 LAB — COMPREHENSIVE METABOLIC PANEL
ALBUMIN: 3.8 g/dL (ref 3.5–5.2)
ALT: 13 U/L (ref 0–35)
AST: 14 U/L (ref 0–37)
Alkaline Phosphatase: 77 U/L (ref 39–117)
BUN: 18 mg/dL (ref 6–23)
CO2: 32 meq/L (ref 19–32)
CREATININE: 1.04 mg/dL (ref 0.40–1.20)
Calcium: 9.4 mg/dL (ref 8.4–10.5)
Chloride: 97 mEq/L (ref 96–112)
GFR: 55.01 mL/min — ABNORMAL LOW (ref >60.00)
Glucose, Bld: 140 mg/dL — ABNORMAL HIGH (ref 70–99)
Potassium: 5 mEq/L (ref 3.5–5.1)
SODIUM: 134 meq/L — AB (ref 135–145)
Total Bilirubin: 0.5 mg/dL (ref 0.2–1.2)
Total Protein: 7 g/dL (ref 6.0–8.3)

## 2016-03-23 LAB — LIPID PANEL
Cholesterol: 144 mg/dL (ref 0–200)
HDL: 35.1 mg/dL — ABNORMAL LOW (ref >39.00)
NONHDL: 108.51
Total CHOL/HDL Ratio: 4
Triglycerides: 222 mg/dL — ABNORMAL HIGH (ref 0.0–149.0)
VLDL: 44.4 mg/dL — ABNORMAL HIGH (ref 0.0–40.0)

## 2016-03-23 LAB — HEMOGLOBIN A1C: Hgb A1c MFr Bld: 8.8 % — ABNORMAL HIGH (ref 4.6–6.5)

## 2016-03-23 LAB — LDL CHOLESTEROL, DIRECT: Direct LDL: 79 mg/dL

## 2016-03-23 NOTE — Patient Instructions (Addendum)
We are checking the blood work today and will call you back with the results. We will also send them to Dr. Buddy Duty so he will have them for your visit.   Diabetes and Standards of Medical Care Diabetes is complicated. You may find that your diabetes team includes a dietitian, nurse, diabetes educator, eye doctor, and more. To help everyone know what is going on and to help you get the care you deserve, the following schedule of care was developed to help keep you on track. Below are the tests, exams, vaccines, medicines, education, and plans you will need. HbA1c test This test shows how well you have controlled your glucose over the past 2-3 months. It is used to see if your diabetes management plan needs to be adjusted.   It is performed at least 2 times a year if you are meeting treatment goals.  It is performed 4 times a year if therapy has changed or if you are not meeting treatment goals. Blood pressure test  This test is performed at every routine medical visit. The goal is less than 140/90 mm Hg for most people, but 130/80 mm Hg in some cases. Ask your health care provider about your goal. Dental exam  Follow up with the dentist regularly. Eye exam  If you are diagnosed with type 1 diabetes as a child, get an exam upon reaching the age of 42 years or older and having had diabetes for 3-5 years. Yearly eye exams are recommended after that initial eye exam.  If you are diagnosed with type 1 diabetes as an adult, get an exam within 5 years of diagnosis and then yearly.  If you are diagnosed with type 2 diabetes, get an exam as soon as possible after the diagnosis and then yearly. Foot care exam  Visual foot exams are performed at every routine medical visit. The exams check for cuts, injuries, or other problems with the feet.  You should have a complete foot exam performed every year. This exam includes an inspection of the structure and skin of your feet, a check of the pulses in your  feet, and a check of the sensation in your feet.  Type 1 diabetes: The first exam is performed 5 years after diagnosis.  Type 2 diabetes: The first exam is performed at the time of diagnosis.  Check your feet nightly for cuts, injuries, or other problems with your feet. Tell your health care provider if anything is not healing. Kidney function test (urine microalbumin)  This test is performed once a year.  Type 1 diabetes: The first test is performed 5 years after diagnosis.  Type 2 diabetes: The first test is performed at the time of diagnosis.  A serum creatinine and estimated glomerular filtration rate (eGFR) test is done once a year to assess the level of chronic kidney disease (CKD), if present. Lipid profile (cholesterol, HDL, LDL, triglycerides)  Performed every 5 years for most people.  The goal for LDL is less than 100 mg/dL. If you are at high risk, the goal is less than 70 mg/dL.  The goal for HDL is 40 mg/dL-50 mg/dL for men and 50 mg/dL-60 mg/dL for women. An HDL cholesterol of 60 mg/dL or higher gives some protection against heart disease.  The goal for triglycerides is less than 150 mg/dL. Immunizations  The flu (influenza) vaccine is recommended yearly for every person 41 months of age or older who has diabetes.  The pneumonia (pneumococcal) vaccine is recommended for every  person 17 years of age or older who has diabetes. Adults 45 years of age or older may receive the pneumonia vaccine as a series of two separate shots.  The hepatitis B vaccine is recommended for adults shortly after they have been diagnosed with diabetes.  The Tdap (tetanus, diphtheria, and pertussis) vaccine should be given:  According to normal childhood vaccination schedules, for children.  Every 10 years, for adults who have diabetes. Diabetes self-management education  Education is recommended at diagnosis and ongoing as needed. Treatment plan  Your treatment plan is reviewed at every  medical visit.   This information is not intended to replace advice given to you by your health care provider. Make sure you discuss any questions you have with your health care provider.   Document Released: 06/13/2009 Document Revised: 09/06/2014 Document Reviewed: 01/16/2013 Elsevier Interactive Patient Education [employer].

## 2016-03-23 NOTE — Progress Notes (Signed)
Pre visit review using our clinic review tool, if applicable. No additional management support is needed unless otherwise documented below in the visit note. 

## 2016-03-23 NOTE — Progress Notes (Signed)
   Subjective:    Patient ID: Mary [name], female    DOB: 1941-01-13, 75 y.o.   MRN: [id number]  HPI The patient is a 75 YO female coming in for follow up of some medical problems not addressed in some time including her diabetes (taking metformin BID and lantus and ARB, seeing endo and not well controlled, last HgA1c Feb 0000000 9.4, complicated by neuropathy which is stable), and her blood pressure (switched to losartan/hctz from lisinopril last visit and BP at goal now, not known to be complicated) and her cholesterol (she is still taking her lipitor but stopped the fenofibrate due to lack of evidence of benefit and possilble side effect of muscle cramps however they have not stopped after stopping fenofibrate). No new concerns. Wishes she could stop smoking but has not attempted. Her husband is still smoking and she wants them both to stop. She does have chronic cough and breathing problems.   Review of Systems  Constitutional: Negative for activity change, appetite change, chills, fatigue and unexpected weight change.  HENT: Negative.   Eyes: Negative.   Respiratory: Negative for cough, chest tightness, shortness of breath and wheezing.   Cardiovascular: Positive for leg swelling. Negative for chest pain and palpitations.  Gastrointestinal: Negative for abdominal distention, abdominal pain, constipation and diarrhea.  Musculoskeletal: Positive for arthralgias. Negative for back pain and myalgias.  Skin: Negative.   Neurological: Positive for numbness. Negative for weakness.  Psychiatric/Behavioral: Negative.       Objective:   Physical Exam  Constitutional: She is oriented to person, place, and time. She appears well-developed and well-nourished.  HENT:  Head: Normocephalic and atraumatic.  Nose: Nose normal.  Mouth/Throat: Oropharynx is clear and moist.  Eyes: EOM are normal.  Neck: Normal range of motion.  Cardiovascular: Normal rate and regular rhythm.   Pulmonary/Chest: Effort  normal and breath sounds normal. No respiratory distress. She has no wheezes. She has no rales.  Abdominal: Soft. Bowel sounds are normal. She exhibits no distension. There is no tenderness. There is no rebound.  Musculoskeletal: She exhibits edema.  Legs swollen to mid shin 1+  Neurological: She is alert and oriented to person, place, and time. Coordination normal.  Skin: Skin is warm and dry.  Psychiatric: She has a normal mood and affect.   Vitals:   03/23/16 1450  BP: 122/78  Pulse: 80  Resp: 18  Temp: 98.2 F (36.8 C)  TempSrc: Oral  SpO2: 93%  Weight: 249 lb 12.8 oz (113.3 kg)  Height: 5\' 7"  (1.702 m)      Assessment & Plan:

## 2016-03-24 NOTE — Assessment & Plan Note (Signed)
BP at goal on her losartan/hctz. Checking CMP and adjust as needed.  

## 2016-03-24 NOTE — Assessment & Plan Note (Signed)
Not well controlled. She is taking lantus and metformin at this time. On ARB. Checking HgA1c and lipid panel and CMP and forward to her endo doctor for adjustment. She thinks that she has visit with them in the next month. She does have neuropathy which is stable to slightly worsening.

## 2016-03-24 NOTE — Assessment & Plan Note (Signed)
We discussed that weight loss continuing will help her overall health including her diabetes, arthritis, blood pressure.

## 2016-03-24 NOTE — Assessment & Plan Note (Signed)
Taking lipitor 80 mg daily and checking lipid panel and adjust as needed. She is off fenofibrate since the last panel per endo.

## 2016-03-25 ENCOUNTER — Encounter: Payer: Self-pay | Admitting: Internal Medicine

## 2016-03-26 ENCOUNTER — Encounter: Payer: Self-pay | Admitting: Internal Medicine

## 2016-03-30 ENCOUNTER — Other Ambulatory Visit: Payer: Self-pay | Admitting: Internal Medicine

## 2016-03-31 MED ORDER — HYDROCODONE-ACETAMINOPHEN 5-325 MG PO TABS: 1.0000 | ORAL_TABLET | Freq: Four times a day (QID) | ORAL | 0 refills | Status: DC | PRN

## 2016-04-08 ENCOUNTER — Other Ambulatory Visit: Payer: Self-pay | Admitting: Internal Medicine

## 2016-04-22 DIAGNOSIS — M1710 Unilateral primary osteoarthritis, unspecified knee: Secondary | ICD-10-CM | POA: Insufficient documentation

## 2016-04-22 DIAGNOSIS — M170 Bilateral primary osteoarthritis of knee: Secondary | ICD-10-CM | POA: Diagnosis not present

## 2016-04-22 DIAGNOSIS — S83241A Other tear of medial meniscus, current injury, right knee, initial encounter: Secondary | ICD-10-CM | POA: Insufficient documentation

## 2016-04-23 ENCOUNTER — Other Ambulatory Visit: Payer: Self-pay | Admitting: Internal Medicine

## 2016-06-02 DIAGNOSIS — E039 Hypothyroidism, unspecified: Secondary | ICD-10-CM | POA: Diagnosis not present

## 2016-06-02 DIAGNOSIS — E049 Nontoxic goiter, unspecified: Secondary | ICD-10-CM | POA: Diagnosis not present

## 2016-06-02 DIAGNOSIS — E1142 Type 2 diabetes mellitus with diabetic polyneuropathy: Secondary | ICD-10-CM | POA: Diagnosis not present

## 2016-06-02 DIAGNOSIS — Z794 Long term (current) use of insulin: Secondary | ICD-10-CM | POA: Diagnosis not present

## 2016-06-04 ENCOUNTER — Encounter: Payer: Self-pay | Admitting: Internal Medicine

## 2016-06-09 DIAGNOSIS — M5416 Radiculopathy, lumbar region: Secondary | ICD-10-CM | POA: Insufficient documentation

## 2016-06-09 DIAGNOSIS — M1710 Unilateral primary osteoarthritis, unspecified knee: Secondary | ICD-10-CM | POA: Diagnosis not present

## 2016-06-11 ENCOUNTER — Ambulatory Visit (INDEPENDENT_AMBULATORY_CARE_PROVIDER_SITE_OTHER): Payer: Medicare Other | Admitting: *Deleted

## 2016-06-11 DIAGNOSIS — I442 Atrioventricular block, complete: Secondary | ICD-10-CM | POA: Diagnosis not present

## 2016-06-11 NOTE — Progress Notes (Signed)
Remote pacemaker transmission.   

## 2016-06-15 DIAGNOSIS — M48061 Spinal stenosis, lumbar region without neurogenic claudication: Secondary | ICD-10-CM | POA: Diagnosis not present

## 2016-06-15 DIAGNOSIS — M5136 Other intervertebral disc degeneration, lumbar region: Secondary | ICD-10-CM | POA: Diagnosis not present

## 2016-06-16 ENCOUNTER — Encounter: Payer: Self-pay | Admitting: Cardiology

## 2016-06-17 DIAGNOSIS — M25551 Pain in right hip: Secondary | ICD-10-CM | POA: Diagnosis not present

## 2016-06-17 DIAGNOSIS — M5416 Radiculopathy, lumbar region: Secondary | ICD-10-CM | POA: Diagnosis not present

## 2016-06-18 ENCOUNTER — Encounter: Payer: Self-pay | Admitting: Interventional Cardiology

## 2016-06-19 ENCOUNTER — Encounter: Payer: Self-pay | Admitting: Interventional Cardiology

## 2016-06-21 ENCOUNTER — Encounter: Payer: Self-pay | Admitting: *Deleted

## 2016-06-21 ENCOUNTER — Telehealth: Payer: Self-pay | Admitting: *Deleted

## 2016-06-21 NOTE — Telephone Encounter (Signed)
Does pt need to hold Eliquis for injection?

## 2016-06-21 NOTE — Telephone Encounter (Signed)
The patient shouldn't be off Eliquis at least 72 hours prior to spine injection. Resume therapy when felt safe by the treating physician.

## 2016-06-21 NOTE — Telephone Encounter (Signed)
-----   Message from Belva Crome, MD sent at 06/19/2016  4:32 PM EDT ----- Regarding: Clearance for Spne injection We need to send a clearance note for lumbar disc injection.  "Please send your recommendation to me and to Dr. Linard Millers at [facility], [address]. [id number], [place], [place] [address]    [place] [phone]. My appointmet is on Nov. 1, 2017 at 2:45.   Thank you for your advise and help with this.  Mary [name] dob [birth year]"

## 2016-06-23 NOTE — Telephone Encounter (Signed)
Fax placed in nurse fax box 

## 2016-06-30 DIAGNOSIS — M5416 Radiculopathy, lumbar region: Secondary | ICD-10-CM | POA: Diagnosis not present

## 2016-07-07 ENCOUNTER — Encounter (HOSPITAL_COMMUNITY): Payer: Self-pay

## 2016-07-07 ENCOUNTER — Emergency Department (HOSPITAL_COMMUNITY)
Admission: EM | Admit: 2016-07-07 | Discharge: 2016-07-08 | Disposition: A | Discharge status: Home / Self Care | Payer: Medicare Other | Attending: Emergency Medicine | Admitting: Emergency Medicine

## 2016-07-07 ENCOUNTER — Emergency Department (HOSPITAL_COMMUNITY): Payer: Medicare Other

## 2016-07-07 DIAGNOSIS — I2510 Atherosclerotic heart disease of native coronary artery without angina pectoris: Secondary | ICD-10-CM | POA: Diagnosis not present

## 2016-07-07 DIAGNOSIS — Z950 Presence of cardiac pacemaker: Secondary | ICD-10-CM | POA: Diagnosis not present

## 2016-07-07 DIAGNOSIS — Z85038 Personal history of other malignant neoplasm of large intestine: Secondary | ICD-10-CM | POA: Diagnosis not present

## 2016-07-07 DIAGNOSIS — Z91040 Latex allergy status: Secondary | ICD-10-CM | POA: Diagnosis not present

## 2016-07-07 DIAGNOSIS — Z7901 Long term (current) use of anticoagulants: Secondary | ICD-10-CM | POA: Insufficient documentation

## 2016-07-07 DIAGNOSIS — E119 Type 2 diabetes mellitus without complications: Secondary | ICD-10-CM | POA: Insufficient documentation

## 2016-07-07 DIAGNOSIS — E871 Hypo-osmolality and hyponatremia: Secondary | ICD-10-CM | POA: Diagnosis not present

## 2016-07-07 DIAGNOSIS — Z7984 Long term (current) use of oral hypoglycemic drugs: Secondary | ICD-10-CM | POA: Diagnosis not present

## 2016-07-07 DIAGNOSIS — I110 Hypertensive heart disease with heart failure: Secondary | ICD-10-CM | POA: Insufficient documentation

## 2016-07-07 DIAGNOSIS — F17210 Nicotine dependence, cigarettes, uncomplicated: Secondary | ICD-10-CM | POA: Insufficient documentation

## 2016-07-07 DIAGNOSIS — I509 Heart failure, unspecified: Secondary | ICD-10-CM | POA: Diagnosis not present

## 2016-07-07 DIAGNOSIS — E039 Hypothyroidism, unspecified: Secondary | ICD-10-CM | POA: Insufficient documentation

## 2016-07-07 DIAGNOSIS — D649 Anemia, unspecified: Secondary | ICD-10-CM | POA: Diagnosis not present

## 2016-07-07 DIAGNOSIS — N289 Disorder of kidney and ureter, unspecified: Secondary | ICD-10-CM | POA: Diagnosis not present

## 2016-07-07 DIAGNOSIS — M7989 Other specified soft tissue disorders: Secondary | ICD-10-CM | POA: Diagnosis present

## 2016-07-07 DIAGNOSIS — Z79899 Other long term (current) drug therapy: Secondary | ICD-10-CM | POA: Diagnosis not present

## 2016-07-07 LAB — CBC
HEMATOCRIT: 34.8 % — AB (ref 36.0–46.0)
HEMOGLOBIN: 11.4 g/dL — AB (ref 12.0–15.0)
MCH: 28.8 pg (ref 26.0–34.0)
MCHC: 32.8 g/dL (ref 30.0–36.0)
MCV: 87.9 fL (ref 78.0–100.0)
Platelets: 324 10*3/uL (ref 150–400)
RBC: 3.96 MIL/uL (ref 3.87–5.11)
RDW: 13.3 % (ref 11.5–15.5)
WBC: 6.7 10*3/uL (ref 4.0–10.5)

## 2016-07-07 LAB — BASIC METABOLIC PANEL
ANION GAP: 9 (ref 5–15)
BUN: 21 mg/dL — ABNORMAL HIGH (ref 6–20)
CALCIUM: 8.7 mg/dL — AB (ref 8.9–10.3)
CHLORIDE: 94 mmol/L — AB (ref 101–111)
CO2: 27 mmol/L (ref 22–32)
Creatinine, Ser: 1.15 mg/dL — ABNORMAL HIGH (ref 0.44–1.00)
GFR calc Af Amer: 53 mL/min — ABNORMAL LOW (ref >60)
GFR calc non Af Amer: 46 mL/min — ABNORMAL LOW (ref >60)
GLUCOSE: 181 mg/dL — AB (ref 65–99)
POTASSIUM: 4.1 mmol/L (ref 3.5–5.1)
Sodium: 130 mmol/L — ABNORMAL LOW (ref 135–145)

## 2016-07-07 LAB — I-STAT TROPONIN, ED: Troponin i, poc: 0.04 ng/mL (ref 0.00–0.08)

## 2016-07-07 NOTE — ED Provider Notes (Signed)
[facility] DEPT Provider Note   CSN: [id number] Arrival date & time: 07/07/16  2240  By signing my name below, I, [name], attest that this documentation has been prepared under the direction and in the presence of Mary Fuel, MD  Electronically Signed: [name], ED Scribe. 07/07/16. 12:00 AM.   History   Chief Complaint Chief Complaint  Patient presents with  . Leg Swelling    The history is provided by the patient. No language interpreter was used.   HPI Comments:  Mary [name] is a 75 y.o. female, with a hx of DM with neuropathy, DVT, A-Fib, and CAD, who presents to the Emergency Department complaining of bilateral leg and bilateral foot swelling with associated pain x several days. Pt notes gradually worsening, moderate left foot, left leg and left big toe swelling x 5 days. She notes sudden, moderate onset right leg and right foot swelling x 1 day. Pt also notes a cough. She has been wearing compression sock with little relief. Pt denies fevers, chills, chest pain, chest tightness, trouble breathing and SOB. She is on eliquis for Atrial Fibrillation. Pt denies any other symptoms or complaints at this time.  Past Medical History:  Diagnosis Date  . Arthritis   . CAD (coronary artery disease)    non obstructive  . Carotid stenosis    Carotid US 5/16:  Bilateral ICA 1-39%; L vertebral retrograde; L BP 126/49, R BP 140/57  . CHF (congestive heart failure) ([place])   . Chronic lower back pain   . Colon cancer ([place])    a. s/p chemo  . Colon polyps   . Diabetic peripheral neuropathy ([place])   . DVT (deep venous thrombosis) ([place]) 2013   "twice behind knee on left side" (06/25/2013)  . Glaucoma   . Hyperlipidemia   . Hypertension   . Hypothyroidism   . Iron deficiency anemia   . Multinodular goiter   . Sleep apnea    a. resolved post weight loss   . Type II diabetes mellitus [facility])     Patient Active Problem List   Diagnosis Date Noted  . Chronic venous  insufficiency 02/21/2015  . 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 Abnormal myocardial perfusion study 02/04/2015  . Dizziness 01/27/2015  . Cardiac pacemaker in situ 05/13/2014  . Atrioventricular block, complete ([place]) 01/02/2014  . PAF (paroxysmal atrial fibrillation) ([place]) 08/29/2013  . Obesity 05/05/2013  . CAD (coronary artery disease)   . Diabetes mellitus with complication ([place])   . Anemia   . Hypothyroidism   . Hypertension   . Colon cancer ([place])   . Hyperlipidemia     Past Surgical History:  Procedure Laterality Date  . CARDIAC CATHETERIZATION  2006   Never had PCI, 3 caths total. Last one in [place]  . CARDIAC CATHETERIZATION N/A 02/04/2015   Procedure: Left Heart Cath and Coronary Angiography;  Surgeon: Belva Crome, MD;  Location: [facility] CV LAB;  Service: Cardiovascular;  Laterality: N/A;  . CATARACT EXTRACTION, BILATERAL Bilateral 2013   "and put stent in my left eye for glaucoma" (06/25/2013)  . CHOLECYSTECTOMY  1980's  . COLECTOMY  10/2010   Tumor removal  . EYE MUSCLE SURGERY Bilateral ~ 1963   "muscles too long; eyes would go out and up; tied muscles to hold my eyes straight" (06/25/2013)  . LEAD REVISION  01-03-2014   atrial lead revision by Dr Rayann Heman  . LEAD REVISION N/A 01/03/2014   Procedure: LEAD REVISION;  Surgeon: Nelda Severe  Allred, MD;  Location: [facility] CATH LAB;  Service: Cardiovascular;  Laterality: N/A;  . PACEMAKER INSERTION  01/02/2014   STJ Assurity dual chamber pacemaker implnated by Dr Lovena Le for SSS  . PERMANENT PACEMAKER INSERTION N/A 01/02/2014   Procedure: PERMANENT PACEMAKER INSERTION;  Surgeon: Evans Lance, MD;  Location: [facility] CATH LAB;  Service: Cardiovascular;  Laterality: N/A;  . PORTACATH PLACEMENT Left 11/2010  . ROUX-EN-Y GASTRIC BYPASS  2009  . ULNAR TUNNEL RELEASE Left ~ 2008  . UMBILICAL HERNIA REPAIR  1970's?    (06/25/2013)    OB History    No data available       Home Medications    Prior to Admission medications   Medication  Sig Start Date End Date Taking? Authorizing Provider  atorvastatin (LIPITOR) 80 MG tablet take 1 tablet by mouth once daily 04/23/16   Hoyt Koch, MD  bimatoprost (LUMIGAN) 0.01 % SOLN Place 1 drop into the right eye at bedtime. Right eye only    Historical Provider, MD  buPROPion (WELLBUTRIN XL) 150 MG 24 hr tablet Take 1 tablet (150 mg total) by mouth daily. 11/25/15   Hoyt Koch, MD  Calcium Citrate-Vitamin D (CITRACAL + D PO) Take 1 tablet by mouth 2 (two) times daily.    Historical Provider, MD  ELIQUIS 5 MG TABS tablet take 1 tablet by mouth twice a day 04/09/16   [name], MD  gabapentin (NEURONTIN) 300 MG capsule Take 300 mg by mouth 2 (two) times daily.     Historical Provider, MD  HYDROcodone-acetaminophen (NORCO/VICODIN) 5-325 MG tablet Take 1 tablet by mouth every 6 (six) hours as needed for moderate pain. 03/31/16   Hoyt Koch, MD  LANTUS SOLOSTAR 100 UNIT/ML Solostar Pen inject 32 units subcutaneously once daily at bedtime ; TITRATE dose as directed 06/10/15   Historical Provider, MD  levothyroxine (SYNTHROID, LEVOTHROID) 112 MCG tablet Take 112 mcg by mouth daily before breakfast.    Historical Provider, MD  losartan-hydrochlorothiazide (HYZAAR) 100-25 MG tablet Take 1 tablet by mouth daily. 08/04/15   Hoyt Koch, MD  metFORMIN (GLUCOPHAGE) 1000 MG tablet Take 1 tablet (1,000 mg total) by mouth 2 (two) times daily with a meal. 08/28/15   Hoyt Koch, MD  nitroGLYCERIN (NITROSTAT) 0.4 MG SL tablet Place 1 tablet (0.4 mg total) under the tongue every 5 (five) minutes as needed for chest pain. 01/13/15   [name], MD    Family History Family History  Problem Relation Age of Onset  . Breast cancer Mother   . Diabetes Mother   . Heart disease Father   . Heart attack Father   . Heart attack Sister   . Heart attack Brother   . Breast cancer Sister   . Breast cancer Maternal Aunt     x 2 aunts  . Pancreatic cancer Brother   .  Prostate cancer Brother   . Colon polyps Brother   . Bladder Cancer Cousin   . Clotting disorder Sister   . Diabetes Sister     x 3  . Diabetes Maternal Grandmother     Social History Social History  Substance Use Topics  . Smoking status: Light Tobacco Smoker    Packs/day: 1.00    Years: 30.00    Types: Cigarettes    Last attempt to quit: 05/03/2013  . Smokeless tobacco: Never Used     Comment: 06/25/2013 "smoked off and on since I was 22; stopped 10/2007 til 08/30/2012 then started again  cause of stress"  . Alcohol use 0.0 oz/week     Comment: 06/25/2013 "glass of wine 1-2 times/yr"     Allergies   Other; Adhesive [tape]; Aspirin; Latex; and Nsaids   Review of Systems Review of Systems  Constitutional: Negative for chills and fever.  Respiratory: Positive for cough and wheezing. Negative for chest tightness and shortness of breath.   Cardiovascular: Positive for leg swelling. Negative for chest pain.  Skin: Positive for color change (erythema).  All other systems reviewed and are negative.    Physical Exam Updated Vital Signs BP 127/99 (BP Location: Left Arm)   Pulse 85   Temp 98.3 F (36.8 C) (Oral)   Resp 18   Ht 5\' 7"  (1.702 m)   Wt 249 lb (112.9 kg)   SpO2 97%   BMI 39.00 kg/m   Physical Exam  Constitutional: She is oriented to person, place, and time. She appears well-developed and well-nourished.  HENT:  Head: Normocephalic and atraumatic.  Eyes: EOM are normal. Pupils are equal, round, and reactive to light.  Neck: Normal range of motion. Neck supple. No JVD present.  Cardiovascular: Normal rate, regular rhythm and normal heart sounds.   No murmur heard. Pulmonary/Chest: Effort normal. She has rhonchi. She has rales. She exhibits no tenderness.  Few bibasilar rales. Diffuse expiratory rhonchi   Abdominal: Soft. Bowel sounds are normal. She exhibits no distension and no mass. There is no tenderness.  Musculoskeletal: Normal range of motion. She  exhibits edema. She exhibits no tenderness.  2+ pretibial and pedal edema. Left toe moderately erythematous with slight warmth. Area of minimal erythema to left distal lower leg without warmth. No tenderness or cords.   Lymphadenopathy:    She has no cervical adenopathy.  Neurological: She is alert and oriented to person, place, and time. No cranial nerve deficit. She exhibits normal muscle tone. Coordination normal.  Stocking glove hypoesthesia. Normal sensation starts at the knees and at the middle phalanges at the fingers.   Skin: Skin is warm and dry. No rash noted. There is erythema.  Psychiatric: She has a normal mood and affect. Her behavior is normal. Judgment and thought content normal.  Nursing note and vitals reviewed.    ED Treatments / Results  DIAGNOSTIC STUDIES:  Oxygen Saturation is 97% on RA, normal by my interpretation.    COORDINATION OF CARE:  11:47 PM Discussed treatment plan with pt at bedside and pt agreed to plan.  Labs (all labs ordered are listed, but only abnormal results are displayed) Labs Reviewed  BASIC METABOLIC PANEL - Abnormal; Notable for the following:       Result Value   Sodium 130 (*)    Chloride 94 (*)    Glucose, Bld 181 (*)    BUN 21 (*)    Creatinine, Ser 1.15 (*)    Calcium 8.7 (*)    GFR calc non Af Amer 46 (*)    GFR calc Af Amer 53 (*)    All other components within normal limits  CBC - Abnormal; Notable for the following:    Hemoglobin 11.4 (*)    HCT 34.8 (*)    All other components within normal limits  BRAIN NATRIURETIC PEPTIDE - Abnormal; Notable for the following:    B Natriuretic Peptide 293.8 (*)    All other components within normal limits  URINALYSIS, ROUTINE W REFLEX MICROSCOPIC (NOT AT [facility]) - Abnormal; Notable for the following:    APPearance CLOUDY (*)    Leukocytes,  UA MODERATE (*)    All other components within normal limits  SEDIMENTATION RATE - Abnormal; Notable for the following:    Sed Rate 30 (*)     All other components within normal limits  URINE MICROSCOPIC-ADD ON - Abnormal; Notable for the following:    Squamous Epithelial / LPF 0-5 (*)    Bacteria, UA RARE (*)    All other components within normal limits  URIC ACID  DIFFERENTIAL  Randolm Idol, ED    Radiology Dg Chest 2 View  Result Date: 07/07/2016 CLINICAL DATA:  Bilateral lower leg and feet swelling with redness in the left great toe for a few days. History of congestive heart failure, hypertension, atrial fibrillation. EXAM: CHEST  2 VIEW COMPARISON:  01/27/2015 FINDINGS: Cardiac pacemaker. Surgical clips in the left upper quadrant. Heart size and pulmonary vascularity are normal. No focal airspace disease or consolidation in the lungs. No blunting of costophrenic angles. No pneumothorax. Mediastinal contours appear intact. Calcified and tortuous aorta. IMPRESSION: No active cardiopulmonary disease. Electronically Signed   By: Lucienne Capers M.D.   On: 07/07/2016 23:37    Procedures Procedures (including critical care time)  Medications Ordered in ED Medications  furosemide (LASIX) injection 40 mg (not administered)  cephALEXin (KEFLEX) capsule 500 mg (not administered)  ipratropium-albuterol (DUONEB) 0.5-2.5 (3) MG/3ML nebulizer solution 3 mL (3 mLs Nebulization Given 07/08/16 0031)  furosemide (LASIX) injection 40 mg (40 mg Intravenous Given 07/08/16 0351)     Initial Impression / Assessment and Plan / ED Course  I have reviewed the triage vital signs and the nursing notes.  Pertinent labs & imaging results that were available during my care of the patient were reviewed by me and considered in my medical decision making (see chart for details).  Clinical Course    Symmetric leg swelling more consistent with CHF then DVT. Patient is already anticoagulated appropriately, so DVT very unlikely. Redness of her right toe could represent cellulitis but also could conceivably be gout. She has neuropathy and cannot feel  pain in the toe. We'll check uric acid level. Screening labs obtained. Old records are reviewed confirming DVT in the left leg in 2013.CHADS-VASC score is 6. Lung findings suggestive of bronchospasm. She will be given albuterol with ipratropium via nebulizer.  Chest x-ray is unremarkable. BNP is moderately elevated consistent with congestive heart failure. Mild anemia is present as well as mild renal insufficiency which is unchanged from baseline. WBC is normal with normal differential, and sedimentation rate is only minimally elevated at 30. Doubt ongoing cellulitis, but overall given a prescription for cephalexin. She is given a dose of furosemide in the ED and is discharged with prescription for furosemide. Follow-up with PCP in 4 days for recheck. Return precautions given.  Final Clinical Impressions(s) / ED Diagnoses   Final diagnoses:  Acute on chronic congestive heart failure, unspecified congestive heart failure type (HCC)  Hyponatremia  Normochromic normocytic anemia  Renal insufficiency    New Prescriptions New Prescriptions   FUROSEMIDE (LASIX) 40 MG TABLET    Take 1 tablet (40 mg total) by mouth daily.  I personally performed the services described in this documentation, which was scribed in my presence. The recorded information has been reviewed and is accurate.   ]    Mary Fuel, MD [id number] [phone]

## 2016-07-07 NOTE — ED Triage Notes (Signed)
Pt states that L leg and foot started swelling on Saturday, swelling resolved and returned today. Pt has a hx of DVT, and CHF. Swelling and 1+ edema in both legs, redness around L ankle and L great toe. No warmth noted.

## 2016-07-08 DIAGNOSIS — I110 Hypertensive heart disease with heart failure: Secondary | ICD-10-CM | POA: Diagnosis not present

## 2016-07-08 LAB — URINALYSIS, ROUTINE W REFLEX MICROSCOPIC
Bilirubin Urine: NEGATIVE
Glucose, UA: NEGATIVE mg/dL
Hgb urine dipstick: NEGATIVE
KETONES UR: NEGATIVE mg/dL
NITRITE: NEGATIVE
PH: 5.5 (ref 5.0–8.0)
Protein, ur: NEGATIVE mg/dL
Specific Gravity, Urine: 1.008 (ref 1.005–1.030)

## 2016-07-08 LAB — DIFFERENTIAL
BASOS ABS: 0 10*3/uL (ref 0.0–0.1)
BASOS PCT: 1 %
Eosinophils Absolute: 0.1 10*3/uL (ref 0.0–0.7)
Eosinophils Relative: 2 %
Lymphocytes Relative: 33 %
Lymphs Abs: 2.5 10*3/uL (ref 0.7–4.0)
MONOS PCT: 10 %
Monocytes Absolute: 0.7 10*3/uL (ref 0.1–1.0)
NEUTROS ABS: 4 10*3/uL (ref 1.7–7.7)
Neutrophils Relative %: 54 %

## 2016-07-08 LAB — URINE MICROSCOPIC-ADD ON: RBC / HPF: NONE SEEN RBC/hpf (ref 0–5)

## 2016-07-08 LAB — SEDIMENTATION RATE: SED RATE: 30 mm/h — AB (ref 0–22)

## 2016-07-08 LAB — URIC ACID: Uric Acid, Serum: 4.9 mg/dL (ref 2.3–6.6)

## 2016-07-08 LAB — BRAIN NATRIURETIC PEPTIDE: B NATRIURETIC PEPTIDE 5: 293.8 pg/mL — AB (ref 0.0–100.0)

## 2016-07-08 MED ORDER — CEPHALEXIN 250 MG PO CAPS
500.0000 mg | ORAL_CAPSULE | Freq: Once | ORAL | Status: AC
  Administered 2016-07-08: 500 mg via ORAL
  Filled 2016-07-08: qty 2

## 2016-07-08 MED ORDER — IPRATROPIUM-ALBUTEROL 0.5-2.5 (3) MG/3ML IN SOLN
3.0000 mL | Freq: Once | RESPIRATORY_TRACT | Status: AC
  Administered 2016-07-08: 3 mL via RESPIRATORY_TRACT
  Filled 2016-07-08: qty 3

## 2016-07-08 MED ORDER — FUROSEMIDE 10 MG/ML IJ SOLN
40.0000 mg | Freq: Once | INTRAMUSCULAR | Status: AC
  Administered 2016-07-08: 40 mg via INTRAVENOUS
  Filled 2016-07-08: qty 4

## 2016-07-08 MED ORDER — CEPHALEXIN 250 MG PO CAPS: 250.0000 mg | ORAL_CAPSULE | Freq: Four times a day (QID) | ORAL | 0 refills | Status: DC

## 2016-07-08 MED ORDER — IPRATROPIUM-ALBUTEROL 0.5-2.5 (3) MG/3ML IN SOLN: 3.0000 mL | Freq: Once | RESPIRATORY_TRACT | Status: DC

## 2016-07-08 MED ORDER — FUROSEMIDE 40 MG PO TABS: 40.0000 mg | ORAL_TABLET | Freq: Every day | ORAL | 0 refills | Status: DC

## 2016-07-08 NOTE — ED Notes (Signed)
Sent add on label

## 2016-07-08 NOTE — Discharge Instructions (Signed)
Return if your swelling is getting worse, if you start having trouble breathing, or start running a fever.

## 2016-07-09 ENCOUNTER — Ambulatory Visit (INDEPENDENT_AMBULATORY_CARE_PROVIDER_SITE_OTHER): Payer: Medicare Other | Admitting: Internal Medicine

## 2016-07-09 ENCOUNTER — Encounter: Payer: Self-pay | Admitting: Internal Medicine

## 2016-07-09 DIAGNOSIS — I2510 Atherosclerotic heart disease of native coronary artery without angina pectoris: Secondary | ICD-10-CM | POA: Diagnosis not present

## 2016-07-09 DIAGNOSIS — I872 Venous insufficiency (chronic) (peripheral): Secondary | ICD-10-CM

## 2016-07-09 DIAGNOSIS — E118 Type 2 diabetes mellitus with unspecified complications: Secondary | ICD-10-CM | POA: Diagnosis not present

## 2016-07-09 MED ORDER — HYDROCODONE-ACETAMINOPHEN 5-325 MG PO TABS: 1.0000 | ORAL_TABLET | Freq: Four times a day (QID) | ORAL | 0 refills | Status: DC | PRN

## 2016-07-09 NOTE — Patient Instructions (Signed)
We have given you the hydrocodone refill today.  Call us back on Monday or Tuesday if you are not better.

## 2016-07-09 NOTE — Progress Notes (Signed)
Pre visit review using our clinic review tool, if applicable. No additional management support is needed unless otherwise documented below in the visit note. 

## 2016-07-09 NOTE — Progress Notes (Signed)
   Subjective:    Patient ID: Mary [name], female    DOB: 22-Dec-1940, 75 y.o.   MRN: [id number]  HPI The patient is a 75 YO female coming in for ER follow up (she was in for cellulitis on her foot and swelling in her left leg from acute heart failure). She has been taking extra lasix and the swelling in her left leg is coming down some. She has been trying to wear her compression stockings but they do not fit well. She is having mild cough which is improving gradually since the lasix was increased. She denies fevers or chills. She did not fill the antibiotic right away and just started it today. She is still having some redness of the toe but not the foot. She will take the medicine until it is gone (2-3 days). No SOB on exertion and she is sleeping about the same as well. She does have history of DVT but is on chronic eliquis for P afib and has not missed any doses recently.   Review of Systems  Constitutional: Positive for activity change. Negative for appetite change, fatigue, fever and unexpected weight change.  HENT: Negative.   Eyes: Negative.   Respiratory: Positive for cough. Negative for chest tightness, shortness of breath and wheezing.   Cardiovascular: Positive for leg swelling. Negative for chest pain and palpitations.  Gastrointestinal: Negative.   Musculoskeletal: Positive for arthralgias. Negative for back pain, gait problem, joint swelling and myalgias.  Skin: Positive for color change. Negative for pallor, rash and wound.  Neurological: Positive for numbness. Negative for weakness.      Objective:   Physical Exam  Constitutional: She is oriented to person, place, and time. She appears well-developed and well-nourished.  HENT:  Head: Normocephalic and atraumatic.  Eyes: EOM are normal.  Cardiovascular: Normal rate.   Pulmonary/Chest: Effort normal and breath sounds normal. No respiratory distress. She has no wheezes. She has no rales.  Abdominal: Soft. She exhibits no  distension. There is no tenderness. There is no rebound.  Musculoskeletal: She exhibits edema.  2+ edema left leg to knee and 1+ in the right leg to knee  Neurological: She is alert and oriented to person, place, and time.  Skin: Skin is warm and dry.   Vitals:   07/09/16 1558  BP: (!) 110/58  Pulse: 79  Resp: 16  Temp: 98.2 F (36.8 C)  TempSrc: Oral  SpO2: 92%  Weight: 253 lb (114.8 kg)  Height: 5\' 7"  (1.702 m)      Assessment & Plan:

## 2016-07-10 ENCOUNTER — Encounter: Payer: Self-pay | Admitting: Internal Medicine

## 2016-07-10 NOTE — Assessment & Plan Note (Signed)
Worse in the left leg recently. She does have a history of DVT but is on eliquis and had not missed any doses recently. No long travels. She will continue taking increased lasix and using compression stocking. She will follow up with cardiology to rule out heart failure.

## 2016-07-10 NOTE — Assessment & Plan Note (Signed)
Will need close follow up on the toe to make sure it is not getting infected. She does not go without shoes with her neuropathy (numb to the knee bilaterally).

## 2016-07-19 DIAGNOSIS — M5417 Radiculopathy, lumbosacral region: Secondary | ICD-10-CM | POA: Diagnosis not present

## 2016-07-19 DIAGNOSIS — R7309 Other abnormal glucose: Secondary | ICD-10-CM | POA: Diagnosis not present

## 2016-08-01 ENCOUNTER — Emergency Department (HOSPITAL_COMMUNITY): Payer: Medicare Other

## 2016-08-01 ENCOUNTER — Encounter (HOSPITAL_COMMUNITY): Payer: Self-pay | Admitting: *Deleted

## 2016-08-01 ENCOUNTER — Inpatient Hospital Stay (HOSPITAL_COMMUNITY)
Admission: EM | Admit: 2016-08-01 | Discharge: 2016-08-05 | DRG: 292 | Disposition: A | Discharge status: Home / Self Care | Payer: Medicare Other | Attending: Internal Medicine | Admitting: Internal Medicine

## 2016-08-01 DIAGNOSIS — G4733 Obstructive sleep apnea (adult) (pediatric): Secondary | ICD-10-CM | POA: Diagnosis present

## 2016-08-01 DIAGNOSIS — Z886 Allergy status to analgesic agent status: Secondary | ICD-10-CM

## 2016-08-01 DIAGNOSIS — I110 Hypertensive heart disease with heart failure: Secondary | ICD-10-CM | POA: Diagnosis not present

## 2016-08-01 DIAGNOSIS — M79605 Pain in left leg: Secondary | ICD-10-CM | POA: Diagnosis present

## 2016-08-01 DIAGNOSIS — M79604 Pain in right leg: Secondary | ICD-10-CM | POA: Diagnosis present

## 2016-08-01 DIAGNOSIS — M1990 Unspecified osteoarthritis, unspecified site: Secondary | ICD-10-CM | POA: Diagnosis present

## 2016-08-01 DIAGNOSIS — E1169 Type 2 diabetes mellitus with other specified complication: Secondary | ICD-10-CM | POA: Diagnosis present

## 2016-08-01 DIAGNOSIS — E669 Obesity, unspecified: Secondary | ICD-10-CM | POA: Diagnosis present

## 2016-08-01 DIAGNOSIS — F32A Depression, unspecified: Secondary | ICD-10-CM | POA: Diagnosis present

## 2016-08-01 DIAGNOSIS — H409 Unspecified glaucoma: Secondary | ICD-10-CM | POA: Diagnosis present

## 2016-08-01 DIAGNOSIS — F329 Major depressive disorder, single episode, unspecified: Secondary | ICD-10-CM | POA: Diagnosis present

## 2016-08-01 DIAGNOSIS — I5033 Acute on chronic diastolic (congestive) heart failure: Secondary | ICD-10-CM | POA: Diagnosis present

## 2016-08-01 DIAGNOSIS — Z91048 Other nonmedicinal substance allergy status: Secondary | ICD-10-CM

## 2016-08-01 DIAGNOSIS — Z9884 Bariatric surgery status: Secondary | ICD-10-CM

## 2016-08-01 DIAGNOSIS — Z7984 Long term (current) use of oral hypoglycemic drugs: Secondary | ICD-10-CM

## 2016-08-01 DIAGNOSIS — R600 Localized edema: Secondary | ICD-10-CM | POA: Diagnosis not present

## 2016-08-01 DIAGNOSIS — E871 Hypo-osmolality and hyponatremia: Secondary | ICD-10-CM | POA: Diagnosis present

## 2016-08-01 DIAGNOSIS — I25118 Atherosclerotic heart disease of native coronary artery with other forms of angina pectoris: Secondary | ICD-10-CM | POA: Diagnosis present

## 2016-08-01 DIAGNOSIS — E118 Type 2 diabetes mellitus with unspecified complications: Secondary | ICD-10-CM | POA: Diagnosis present

## 2016-08-01 DIAGNOSIS — Z8249 Family history of ischemic heart disease and other diseases of the circulatory system: Secondary | ICD-10-CM

## 2016-08-01 DIAGNOSIS — R609 Edema, unspecified: Secondary | ICD-10-CM

## 2016-08-01 DIAGNOSIS — Z6833 Body mass index (BMI) 33.0-33.9, adult: Secondary | ICD-10-CM

## 2016-08-01 DIAGNOSIS — I10 Essential (primary) hypertension: Secondary | ICD-10-CM | POA: Diagnosis present

## 2016-08-01 DIAGNOSIS — E039 Hypothyroidism, unspecified: Secondary | ICD-10-CM | POA: Diagnosis present

## 2016-08-01 DIAGNOSIS — M545 Low back pain: Secondary | ICD-10-CM | POA: Diagnosis present

## 2016-08-01 DIAGNOSIS — Z720 Tobacco use: Secondary | ICD-10-CM | POA: Diagnosis present

## 2016-08-01 DIAGNOSIS — G8929 Other chronic pain: Secondary | ICD-10-CM | POA: Diagnosis present

## 2016-08-01 DIAGNOSIS — E785 Hyperlipidemia, unspecified: Secondary | ICD-10-CM | POA: Diagnosis present

## 2016-08-01 DIAGNOSIS — R0789 Other chest pain: Secondary | ICD-10-CM | POA: Diagnosis not present

## 2016-08-01 DIAGNOSIS — I248 Other forms of acute ischemic heart disease: Secondary | ICD-10-CM | POA: Diagnosis present

## 2016-08-01 DIAGNOSIS — Z79899 Other long term (current) drug therapy: Secondary | ICD-10-CM

## 2016-08-01 DIAGNOSIS — E1142 Type 2 diabetes mellitus with diabetic polyneuropathy: Secondary | ICD-10-CM | POA: Diagnosis not present

## 2016-08-01 DIAGNOSIS — Z79891 Long term (current) use of opiate analgesic: Secondary | ICD-10-CM

## 2016-08-01 DIAGNOSIS — F17210 Nicotine dependence, cigarettes, uncomplicated: Secondary | ICD-10-CM | POA: Diagnosis present

## 2016-08-01 DIAGNOSIS — Z7901 Long term (current) use of anticoagulants: Secondary | ICD-10-CM

## 2016-08-01 DIAGNOSIS — Z85038 Personal history of other malignant neoplasm of large intestine: Secondary | ICD-10-CM

## 2016-08-01 DIAGNOSIS — Z888 Allergy status to other drugs, medicaments and biological substances status: Secondary | ICD-10-CM

## 2016-08-01 DIAGNOSIS — Z950 Presence of cardiac pacemaker: Secondary | ICD-10-CM | POA: Diagnosis present

## 2016-08-01 DIAGNOSIS — Z86718 Personal history of other venous thrombosis and embolism: Secondary | ICD-10-CM

## 2016-08-01 DIAGNOSIS — Z794 Long term (current) use of insulin: Secondary | ICD-10-CM

## 2016-08-01 DIAGNOSIS — O2230 Deep phlebothrombosis in pregnancy, unspecified trimester: Secondary | ICD-10-CM | POA: Diagnosis present

## 2016-08-01 DIAGNOSIS — D509 Iron deficiency anemia, unspecified: Secondary | ICD-10-CM | POA: Diagnosis not present

## 2016-08-01 DIAGNOSIS — I700 Atherosclerosis of aorta: Secondary | ICD-10-CM | POA: Diagnosis present

## 2016-08-01 DIAGNOSIS — I2510 Atherosclerotic heart disease of native coronary artery without angina pectoris: Secondary | ICD-10-CM | POA: Diagnosis present

## 2016-08-01 DIAGNOSIS — I872 Venous insufficiency (chronic) (peripheral): Secondary | ICD-10-CM | POA: Diagnosis present

## 2016-08-01 DIAGNOSIS — I480 Paroxysmal atrial fibrillation: Secondary | ICD-10-CM | POA: Diagnosis present

## 2016-08-01 DIAGNOSIS — R079 Chest pain, unspecified: Secondary | ICD-10-CM

## 2016-08-01 DIAGNOSIS — I441 Atrioventricular block, second degree: Secondary | ICD-10-CM

## 2016-08-01 DIAGNOSIS — Z9221 Personal history of antineoplastic chemotherapy: Secondary | ICD-10-CM

## 2016-08-01 DIAGNOSIS — I5032 Chronic diastolic (congestive) heart failure: Secondary | ICD-10-CM | POA: Diagnosis present

## 2016-08-01 DIAGNOSIS — Z86010 Personal history of colonic polyps: Secondary | ICD-10-CM

## 2016-08-01 DIAGNOSIS — Z91040 Latex allergy status: Secondary | ICD-10-CM

## 2016-08-01 DIAGNOSIS — Z9049 Acquired absence of other specified parts of digestive tract: Secondary | ICD-10-CM

## 2016-08-01 LAB — CBC
HEMATOCRIT: 35.9 % — AB (ref 36.0–46.0)
HEMOGLOBIN: 11.7 g/dL — AB (ref 12.0–15.0)
MCH: 29 pg (ref 26.0–34.0)
MCHC: 32.6 g/dL (ref 30.0–36.0)
MCV: 89.1 fL (ref 78.0–100.0)
Platelets: 331 10*3/uL (ref 150–400)
RBC: 4.03 MIL/uL (ref 3.87–5.11)
RDW: 13.8 % (ref 11.5–15.5)
WBC: 9.4 10*3/uL (ref 4.0–10.5)

## 2016-08-01 LAB — BASIC METABOLIC PANEL
ANION GAP: 11 (ref 5–15)
BUN: 21 mg/dL — ABNORMAL HIGH (ref 6–20)
CO2: 28 mmol/L (ref 22–32)
Calcium: 9.3 mg/dL (ref 8.9–10.3)
Chloride: 94 mmol/L — ABNORMAL LOW (ref 101–111)
Creatinine, Ser: 1.08 mg/dL — ABNORMAL HIGH (ref 0.44–1.00)
GFR, EST AFRICAN AMERICAN: 57 mL/min — AB (ref >60)
GFR, EST NON AFRICAN AMERICAN: 49 mL/min — AB (ref >60)
Glucose, Bld: 113 mg/dL — ABNORMAL HIGH (ref 65–99)
POTASSIUM: 4.1 mmol/L (ref 3.5–5.1)
SODIUM: 133 mmol/L — AB (ref 135–145)

## 2016-08-01 LAB — I-STAT TROPONIN, ED: TROPONIN I, POC: 0 ng/mL (ref 0.00–0.08)

## 2016-08-01 LAB — BRAIN NATRIURETIC PEPTIDE: B NATRIURETIC PEPTIDE 5: 66.3 pg/mL (ref 0.0–100.0)

## 2016-08-01 MED ORDER — ALBUTEROL SULFATE HFA 108 (90 BASE) MCG/ACT IN AERS: 2.0000 | INHALATION_SPRAY | Freq: Once | RESPIRATORY_TRACT | Status: DC

## 2016-08-01 MED ORDER — FUROSEMIDE 10 MG/ML IJ SOLN
60.0000 mg | Freq: Once | INTRAMUSCULAR | Status: AC
  Administered 2016-08-01: 60 mg via INTRAVENOUS
  Filled 2016-08-01: qty 6

## 2016-08-01 NOTE — ED Notes (Signed)
The pt has had bi-lateral swollen legs  Since Friday  Sl chest pain  Swelling worse today  She had the same in November had lasix for 2 weeks alert no distress

## 2016-08-01 NOTE — ED Triage Notes (Addendum)
Pt c/o "flopping feeling in my chest," bilateral leg swelling with pain in both legs. Took a nitro around 4pm that helped with chest discomfort. Pt seen in November and diagnosed with CHF. Reports having  Combinations medications that have a diuretic. Pt reports weight loss and nausea.

## 2016-08-01 NOTE — ED Provider Notes (Signed)
Results of bilateral leg swelling over the past 2 days when she ran out of Lasix. She patient also reports anterior chest pressure earlier today which was alleviated after she took one nitroglycerin tablet. Chest pressure onset at rest. She is presently asymptomatic   Orlie Dakin, MD 08/01/16 2329

## 2016-08-01 NOTE — ED Provider Notes (Signed)
[facility] DEPT Provider Note   CSN: [id number] Arrival date & time: 08/01/16  1913     History   Chief Complaint Chief Complaint  Patient presents with  . Leg Swelling  . Nausea  . Chest Pain    HPI Mary [name] is a 75 y.o. female.  HPI Mary [name] is a 75 y.o. female with history of coronary artery disease, nonobstructive, no prior stenting, CHF, colon cancer remission, history of DVT, hypertension, diabetes, hypothyroidism, A. fib, has pacemaker, presents to emergency department complaining of lower leg swelling, chest pressure, inability to urinate. Patient states that she developed leg swelling 2 days ago. This is a recurrent issue. Reports being seen in emergency department approximately 2 months ago for the same. States at that time she was not admitted but diagnosed with CHF. She was given Lasix to take at home. States she was given approximately 10 days worth, and she followed up with her doctor, however that time her leg swelling improved and she was not started on Lasix by her PCP. She states 2 days ago she has noticed swelling in her bilateral lower legs. She reports that today the swelling has gotten worse and she developed pain in both legs. She has been taking her husband's Lasix, states she has been taking 40 mg for the last 2 days which states has helped. Today she developed some chest pressure while at home and headache which brought her to the emergency department. States when she developed chest pressure she had no associated symptoms. She took one nitroglycerin which has helped around 4 PM. She has not had any more chest pressure since then. Patient states she has been having difficulty urinating since yesterday. She states that she has an urge to urinate but unable to get any urine out, just "dribbles." She states she has been drinking a large amount of water today to try to urinate. Reports dry non productive cough for "few weeks" and some orthopnea.   Past  Medical History:  Diagnosis Date  . Arthritis   . CAD (coronary artery disease)    non obstructive  . Carotid stenosis    Carotid US 5/16:  Bilateral ICA 1-39%; L vertebral retrograde; L BP 126/49, R BP 140/57  . CHF (congestive heart failure) ([place])   . Chronic lower back pain   . Colon cancer ([place])    a. s/p chemo  . Colon polyps   . Diabetic peripheral neuropathy ([place])   . DVT (deep venous thrombosis) ([place]) 2013   "twice behind knee on left side" (06/25/2013)  . Glaucoma   . Hyperlipidemia   . Hypertension   . Hypothyroidism   . Iron deficiency anemia   . Multinodular goiter   . Sleep apnea    a. resolved post weight loss   . Type II diabetes mellitus [facility])     Patient Active Problem List   Diagnosis Date Noted  . Chronic venous insufficiency 02/21/2015  . 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 Abnormal myocardial perfusion study 02/04/2015  . Dizziness 01/27/2015  . Cardiac pacemaker in situ 05/13/2014  . Atrioventricular block, complete ([place]) 01/02/2014  . PAF (paroxysmal atrial fibrillation) ([place]) 08/29/2013  . Obesity 05/05/2013  . CAD (coronary artery disease)   . Diabetes mellitus with complication ([place])   . Anemia   . Hypothyroidism   . Hypertension   . Colon cancer ([place])   . Hyperlipidemia     Past Surgical History:  Procedure Laterality Date  . CARDIAC  CATHETERIZATION  2006   Never had PCI, 3 caths total. Last one in [place]  . CARDIAC CATHETERIZATION N/A 02/04/2015   Procedure: Left Heart Cath and Coronary Angiography;  Surgeon: Belva Crome, MD;  Location: [facility] CV LAB;  Service: Cardiovascular;  Laterality: N/A;  . CATARACT EXTRACTION, BILATERAL Bilateral 2013   "and put stent in my left eye for glaucoma" (06/25/2013)  . CHOLECYSTECTOMY  1980's  . COLECTOMY  10/2010   Tumor removal  . EYE MUSCLE SURGERY Bilateral ~ 1963   "muscles too long; eyes would go out and up; tied muscles to hold my eyes straight" (06/25/2013)  . LEAD REVISION  01-03-2014     atrial lead revision by Dr Rayann Heman  . LEAD REVISION N/A 01/03/2014   Procedure: LEAD REVISION;  Surgeon: Coralyn Mark, MD;  Location: [facility] CATH LAB;  Service: Cardiovascular;  Laterality: N/A;  . PACEMAKER INSERTION  01/02/2014   STJ Assurity dual chamber pacemaker implnated by Dr Lovena Le for SSS  . PERMANENT PACEMAKER INSERTION N/A 01/02/2014   Procedure: PERMANENT PACEMAKER INSERTION;  Surgeon: Evans Lance, MD;  Location: [facility] CATH LAB;  Service: Cardiovascular;  Laterality: N/A;  . PORTACATH PLACEMENT Left 11/2010  . ROUX-EN-Y GASTRIC BYPASS  2009  . ULNAR TUNNEL RELEASE Left ~ 2008  . UMBILICAL HERNIA REPAIR  1970's?    (06/25/2013)    OB History    No data available       Home Medications    Prior to Admission medications   Medication Sig Start Date End Date Taking? Authorizing Provider  atorvastatin (LIPITOR) 80 MG tablet take 1 tablet by mouth once daily 04/23/16   Hoyt Koch, MD  bimatoprost (LUMIGAN) 0.01 % SOLN Place 1 drop into the right eye at bedtime. Right eye only    Historical Provider, MD  buPROPion (WELLBUTRIN XL) 150 MG 24 hr tablet Take 1 tablet (150 mg total) by mouth daily. 11/25/15   Hoyt Koch, MD  cephALEXin (KEFLEX) 250 MG capsule Take 1 capsule (250 mg total) by mouth 4 (four) times daily. [id number]   Delora Fuel, MD  ELIQUIS 5 MG TABS tablet take 1 tablet by mouth twice a day 04/09/16   [name], MD  furosemide (LASIX) 40 MG tablet Take 1 tablet (40 mg total) by mouth daily. [id number]   Delora Fuel, MD  gabapentin (NEURONTIN) 300 MG capsule Take 300 mg by mouth 2 (two) times daily.     Historical Provider, MD  HYDROcodone-acetaminophen (NORCO/VICODIN) 5-325 MG tablet Take 1 tablet by mouth every 6 (six) hours as needed for moderate pain. 07/09/16   Hoyt Koch, MD  LANTUS SOLOSTAR 100 UNIT/ML Solostar Pen inject 14 units subcutaneously once daily at bedtime ; TITRATE dose as directed 06/10/15   Historical Provider, MD  levothyroxine  (SYNTHROID, LEVOTHROID) 112 MCG tablet Take 112 mcg by mouth daily before breakfast.    Historical Provider, MD  losartan-hydrochlorothiazide (HYZAAR) 100-25 MG tablet Take 1 tablet by mouth daily. 08/04/15   Hoyt Koch, MD  metFORMIN (GLUCOPHAGE) 1000 MG tablet Take 1 tablet (1,000 mg total) by mouth 2 (two) times daily with a meal. 08/28/15   Hoyt Koch, MD  nitroGLYCERIN (NITROSTAT) 0.4 MG SL tablet Place 1 tablet (0.4 mg total) under the tongue every 5 (five) minutes as needed for chest pain. 01/13/15   [name], MD    Family History Family History  Problem Relation Age of Onset  . Breast cancer Mother   .  Diabetes Mother   . Heart disease Father   . Heart attack Father   . Heart attack Sister   . Heart attack Brother   . Breast cancer Sister   . Breast cancer Maternal Aunt     x 2 aunts  . Pancreatic cancer Brother   . Prostate cancer Brother   . Colon polyps Brother   . Bladder Cancer Cousin   . Clotting disorder Sister   . Diabetes Sister     x 3  . Diabetes Maternal Grandmother     Social History Social History  Substance Use Topics  . Smoking status: Light Tobacco Smoker    Packs/day: 1.00    Years: 30.00    Types: Cigarettes    Last attempt to quit: 05/03/2013  . Smokeless tobacco: Never Used     Comment: 06/25/2013 "smoked off and on since I was 22; stopped 10/2007 til 08/30/2012 then started again cause of stress"  . Alcohol use 0.0 oz/week     Comment: 06/25/2013 "glass of wine 1-2 times/yr"     Allergies   Other; Adhesive [tape]; Aspirin; Latex; and Nsaids   Review of Systems Review of Systems  Constitutional: Negative for chills and fever.  Respiratory: Positive for cough and chest tightness. Negative for shortness of breath.   Cardiovascular: Positive for chest pain and leg swelling. Negative for palpitations.  Gastrointestinal: Negative for abdominal pain, diarrhea, nausea and vomiting.  Genitourinary: Positive for difficulty  urinating. Negative for dysuria, flank pain and pelvic pain.  Musculoskeletal: Positive for arthralgias. Negative for myalgias, neck pain and neck stiffness.  Skin: Negative for rash.  Neurological: Negative for dizziness, weakness and headaches.  All other systems reviewed and are negative.    Physical Exam Updated Vital Signs BP 153/65 (BP Location: Left Arm)   Pulse 68   Temp 98.9 F (37.2 C)   Resp 18   Wt 111.9 kg   SpO2 99%   BMI 38.65 kg/m   Physical Exam  Constitutional: She appears well-developed and well-nourished. No distress.  HENT:  Head: Normocephalic.  Eyes: Conjunctivae are normal.  Neck: Neck supple.  Cardiovascular: Normal rate, regular rhythm and normal heart sounds.   Pulmonary/Chest: Effort normal. No respiratory distress. She has wheezes. She has no rales.  Mild end expiratory wheezes bialterally  Abdominal: Soft. Bowel sounds are normal. She exhibits no distension. There is no tenderness. There is no rebound.  Musculoskeletal: She exhibits edema.  2+ pitting edema in bilateral lower legs up to the knee level. Dorsal pedal pulses intact and equal bilaterally  Neurological: She is alert.  Skin: Skin is warm and dry.  Psychiatric: She has a normal mood and affect. Her behavior is normal.  Nursing note and vitals reviewed.    ED Treatments / Results  Labs (all labs ordered are listed, but only abnormal results are displayed) Labs Reviewed  BASIC METABOLIC PANEL - Abnormal; Notable for the following:       Result Value   Sodium 133 (*)    Chloride 94 (*)    Glucose, Bld 113 (*)    BUN 21 (*)    Creatinine, Ser 1.08 (*)    GFR calc non Af Amer 49 (*)    GFR calc Af Amer 57 (*)    All other components within normal limits  CBC - Abnormal; Notable for the following:    Hemoglobin 11.7 (*)    HCT 35.9 (*)    All other components within normal limits  BRAIN  NATRIURETIC PEPTIDE  URINALYSIS, ROUTINE W REFLEX MICROSCOPIC (NOT AT [facility])  I-STAT  TROPOININ, ED    EKG  EKG Interpretation  Date/Time:  Sunday August 01 2016 19:19:57 EST Ventricular Rate:  69 PR Interval:  180 QRS Duration: 190 QT Interval:  494 QTC Calculation: 529 R Axis:   -87 Text Interpretation:  Atrial-sensed ventricular-paced rhythm Abnormal ECG Similar to prior.  Confirmed by LONG MD, JOSHUA [phone]) on 08/01/2016 8:11:15 PM       Radiology Dg Chest 2 View  Result Date: 08/01/2016 CLINICAL DATA:  Pt c/o "flopping feeling in my chest," bilateral leg swelling with pain in both legs. Took a nitro around 4pm that helped with chest discomfort. Pt seen in November and diagnosed with CHF. Weight loss and nausea. History of diabetes, hypertension and smoking. EXAM: CHEST  2 VIEW COMPARISON:  07/07/2016 and 01/27/2015. FINDINGS: Left subclavian pacemaker leads are unchanged within the right atrium and right ventricle. The heart size and mediastinal contours are stable. There is aortic atherosclerosis. There is improved aeration of the lungs which are clear. No pleural effusion or pneumothorax. The bones appear unchanged. Surgical clips are present in the epigastric region. IMPRESSION: No active cardiopulmonary process. Electronically Signed   By: Richardean Sale M.D.   On: 08/01/2016 21:02    Procedures Procedures (including critical care time)  Medications Ordered in ED Medications  furosemide (LASIX) injection 60 mg (not administered)     Initial Impression / Assessment and Plan / ED Course  I have reviewed the triage vital signs and the nursing notes.  Pertinent labs & imaging results that were available during my care of the patient were reviewed by me and considered in my medical decision making (see chart for details).  Clinical Course    Agent seen and examined. Patient with bilateral lower leg swelling, episode of chest pain which was relieved by nitroglycerin around 4 PM today, headache which improved. Will get labs including BNP, chest x-ray,  urinalysis. Patient is concerned because she hasn't been able to urinate much since yesterday. Will do bladder scanner. We'll try IV Lasix. We'll try albuterol inhaler for wheezing  11:52 PM Trop, BNP negative. CXR negative. Discussed with Dr. Winfred Leeds, will admit for obs and echo. Spoke with cardiology, asked for medial admission. Consult them is something results concerning.   12:18 AM Spoke with medicine, will admit.   Vitals:   08/01/16 2130 08/01/16 2200  BP: 129/61 129/72  Pulse: 68 66  Resp: 17 19  Temp:        Final Clinical Impressions(s) / ED Diagnoses   Final diagnoses:  Chest pain, unspecified type  Peripheral edema    New Prescriptions New Prescriptions   No medications on file     Jeannett Senior, PA-C 08/02/16 [place], MD 08/04/16 1811

## 2016-08-02 ENCOUNTER — Encounter (HOSPITAL_COMMUNITY): Payer: Self-pay | Admitting: *Deleted

## 2016-08-02 ENCOUNTER — Inpatient Hospital Stay (HOSPITAL_COMMUNITY): Payer: Medicare Other

## 2016-08-02 DIAGNOSIS — R0789 Other chest pain: Secondary | ICD-10-CM | POA: Diagnosis present

## 2016-08-02 DIAGNOSIS — I5033 Acute on chronic diastolic (congestive) heart failure: Secondary | ICD-10-CM | POA: Diagnosis not present

## 2016-08-02 DIAGNOSIS — M79605 Pain in left leg: Secondary | ICD-10-CM | POA: Diagnosis present

## 2016-08-02 DIAGNOSIS — R600 Localized edema: Secondary | ICD-10-CM | POA: Insufficient documentation

## 2016-08-02 DIAGNOSIS — I2510 Atherosclerotic heart disease of native coronary artery without angina pectoris: Secondary | ICD-10-CM | POA: Diagnosis not present

## 2016-08-02 DIAGNOSIS — I248 Other forms of acute ischemic heart disease: Secondary | ICD-10-CM | POA: Diagnosis not present

## 2016-08-02 DIAGNOSIS — I700 Atherosclerosis of aorta: Secondary | ICD-10-CM | POA: Diagnosis not present

## 2016-08-02 DIAGNOSIS — E669 Obesity, unspecified: Secondary | ICD-10-CM | POA: Diagnosis present

## 2016-08-02 DIAGNOSIS — I5032 Chronic diastolic (congestive) heart failure: Secondary | ICD-10-CM | POA: Diagnosis present

## 2016-08-02 DIAGNOSIS — G4733 Obstructive sleep apnea (adult) (pediatric): Secondary | ICD-10-CM | POA: Diagnosis present

## 2016-08-02 DIAGNOSIS — M1990 Unspecified osteoarthritis, unspecified site: Secondary | ICD-10-CM | POA: Diagnosis present

## 2016-08-02 DIAGNOSIS — Z720 Tobacco use: Secondary | ICD-10-CM | POA: Diagnosis present

## 2016-08-02 DIAGNOSIS — I872 Venous insufficiency (chronic) (peripheral): Secondary | ICD-10-CM | POA: Diagnosis not present

## 2016-08-02 DIAGNOSIS — G8929 Other chronic pain: Secondary | ICD-10-CM | POA: Diagnosis present

## 2016-08-02 DIAGNOSIS — M79604 Pain in right leg: Secondary | ICD-10-CM | POA: Diagnosis not present

## 2016-08-02 DIAGNOSIS — H409 Unspecified glaucoma: Secondary | ICD-10-CM | POA: Diagnosis present

## 2016-08-02 DIAGNOSIS — I509 Heart failure, unspecified: Secondary | ICD-10-CM

## 2016-08-02 DIAGNOSIS — E1142 Type 2 diabetes mellitus with diabetic polyneuropathy: Secondary | ICD-10-CM | POA: Diagnosis not present

## 2016-08-02 DIAGNOSIS — F32A Depression, unspecified: Secondary | ICD-10-CM | POA: Diagnosis present

## 2016-08-02 DIAGNOSIS — Z950 Presence of cardiac pacemaker: Secondary | ICD-10-CM | POA: Diagnosis not present

## 2016-08-02 DIAGNOSIS — I110 Hypertensive heart disease with heart failure: Secondary | ICD-10-CM | POA: Diagnosis not present

## 2016-08-02 DIAGNOSIS — I480 Paroxysmal atrial fibrillation: Secondary | ICD-10-CM | POA: Diagnosis not present

## 2016-08-02 DIAGNOSIS — E785 Hyperlipidemia, unspecified: Secondary | ICD-10-CM

## 2016-08-02 DIAGNOSIS — M545 Low back pain: Secondary | ICD-10-CM | POA: Diagnosis present

## 2016-08-02 DIAGNOSIS — Z9221 Personal history of antineoplastic chemotherapy: Secondary | ICD-10-CM | POA: Diagnosis not present

## 2016-08-02 DIAGNOSIS — O2230 Deep phlebothrombosis in pregnancy, unspecified trimester: Secondary | ICD-10-CM | POA: Diagnosis present

## 2016-08-02 DIAGNOSIS — E039 Hypothyroidism, unspecified: Secondary | ICD-10-CM | POA: Diagnosis not present

## 2016-08-02 DIAGNOSIS — R609 Edema, unspecified: Secondary | ICD-10-CM | POA: Insufficient documentation

## 2016-08-02 DIAGNOSIS — I10 Essential (primary) hypertension: Secondary | ICD-10-CM | POA: Diagnosis not present

## 2016-08-02 DIAGNOSIS — F329 Major depressive disorder, single episode, unspecified: Secondary | ICD-10-CM | POA: Diagnosis present

## 2016-08-02 DIAGNOSIS — D509 Iron deficiency anemia, unspecified: Secondary | ICD-10-CM | POA: Diagnosis not present

## 2016-08-02 DIAGNOSIS — E871 Hypo-osmolality and hyponatremia: Secondary | ICD-10-CM | POA: Diagnosis not present

## 2016-08-02 DIAGNOSIS — R079 Chest pain, unspecified: Secondary | ICD-10-CM | POA: Diagnosis not present

## 2016-08-02 DIAGNOSIS — F17210 Nicotine dependence, cigarettes, uncomplicated: Secondary | ICD-10-CM | POA: Diagnosis not present

## 2016-08-02 LAB — URINALYSIS, ROUTINE W REFLEX MICROSCOPIC
Bilirubin Urine: NEGATIVE
GLUCOSE, UA: NEGATIVE mg/dL
Hgb urine dipstick: NEGATIVE
KETONES UR: NEGATIVE mg/dL
NITRITE: NEGATIVE
PH: 6 (ref 5.0–8.0)
Protein, ur: NEGATIVE mg/dL
SPECIFIC GRAVITY, URINE: 1.007 (ref 1.005–1.030)

## 2016-08-02 LAB — LIPID PANEL
CHOL/HDL RATIO: 4.2 ratio
Cholesterol: 178 mg/dL (ref 0–200)
HDL: 42 mg/dL (ref >40)
LDL CALC: 90 mg/dL (ref 0–99)
TRIGLYCERIDES: 229 mg/dL — AB (ref <150)
VLDL: 46 mg/dL — AB (ref 0–40)

## 2016-08-02 LAB — GLUCOSE, CAPILLARY
GLUCOSE-CAPILLARY: 124 mg/dL — AB (ref 65–99)
GLUCOSE-CAPILLARY: 202 mg/dL — AB (ref 65–99)
Glucose-Capillary: 151 mg/dL — ABNORMAL HIGH (ref 65–99)
Glucose-Capillary: 209 mg/dL — ABNORMAL HIGH (ref 65–99)

## 2016-08-02 LAB — URINE MICROSCOPIC-ADD ON

## 2016-08-02 LAB — ECHOCARDIOGRAM COMPLETE
Height: 67 in
Weight: 4003.2 oz

## 2016-08-02 LAB — TROPONIN I
Troponin I: <0.03 ng/mL (ref <0.03)
Troponin I: <0.03 ng/mL (ref <0.03)

## 2016-08-02 LAB — MRSA PCR SCREENING: MRSA BY PCR: NEGATIVE

## 2016-08-02 LAB — TSH: TSH: 4.896 u[IU]/mL — AB (ref 0.350–4.500)

## 2016-08-02 MED ORDER — NITROGLYCERIN 0.4 MG SL SUBL: 0.4000 mg | SUBLINGUAL_TABLET | SUBLINGUAL | Status: DC | PRN

## 2016-08-02 MED ORDER — LOSARTAN POTASSIUM-HCTZ 100-25 MG PO TABS: 1.0000 | ORAL_TABLET | Freq: Every day | ORAL | Status: DC

## 2016-08-02 MED ORDER — HYDROCHLOROTHIAZIDE 25 MG PO TABS
25.0000 mg | ORAL_TABLET | Freq: Every day | ORAL | Status: DC
  Administered 2016-08-02: 25 mg via ORAL
  Filled 2016-08-02: qty 1

## 2016-08-02 MED ORDER — HYDROCODONE-ACETAMINOPHEN 5-325 MG PO TABS
1.0000 | ORAL_TABLET | Freq: Four times a day (QID) | ORAL | Status: DC | PRN
Start: 2016-08-02 — End: 2016-08-05
  Administered 2016-08-02: 1 via ORAL
  Filled 2016-08-02: qty 1

## 2016-08-02 MED ORDER — ACETAMINOPHEN 325 MG PO TABS: 650.0000 mg | ORAL_TABLET | ORAL | Status: DC | PRN

## 2016-08-02 MED ORDER — GABAPENTIN 300 MG PO CAPS
300.0000 mg | ORAL_CAPSULE | Freq: Two times a day (BID) | ORAL | Status: DC
  Administered 2016-08-02 – 2016-08-05 (×8): 300 mg via ORAL
  Filled 2016-08-02 (×8): qty 1

## 2016-08-02 MED ORDER — BUPROPION HCL ER (XL) 150 MG PO TB24
150.0000 mg | ORAL_TABLET | Freq: Every day | ORAL | Status: DC
  Administered 2016-08-02 – 2016-08-05 (×4): 150 mg via ORAL
  Filled 2016-08-02 (×4): qty 1

## 2016-08-02 MED ORDER — INSULIN GLARGINE 100 UNIT/ML ~~LOC~~ SOLN
16.0000 [IU] | Freq: Every day | SUBCUTANEOUS | Status: DC
  Administered 2016-08-02 – 2016-08-04 (×4): 16 [IU] via SUBCUTANEOUS
  Filled 2016-08-02 (×6): qty 0.16

## 2016-08-02 MED ORDER — ONDANSETRON HCL 4 MG/2ML IJ SOLN: 4.0000 mg | Freq: Four times a day (QID) | INTRAMUSCULAR | Status: DC | PRN

## 2016-08-02 MED ORDER — NICOTINE 21 MG/24HR TD PT24
21.0000 mg | MEDICATED_PATCH | Freq: Every day | TRANSDERMAL | Status: DC
  Administered 2016-08-02 – 2016-08-05 (×4): 21 mg via TRANSDERMAL
  Filled 2016-08-02 (×4): qty 1

## 2016-08-02 MED ORDER — HYDROXYZINE HCL 10 MG PO TABS
10.0000 mg | ORAL_TABLET | Freq: Three times a day (TID) | ORAL | Status: DC | PRN
  Filled 2016-08-02: qty 1

## 2016-08-02 MED ORDER — SODIUM CHLORIDE 0.9% FLUSH
3.0000 mL | Freq: Two times a day (BID) | INTRAVENOUS | Status: DC
  Administered 2016-08-02 – 2016-08-04 (×5): 3 mL via INTRAVENOUS

## 2016-08-02 MED ORDER — ATORVASTATIN CALCIUM 80 MG PO TABS
80.0000 mg | ORAL_TABLET | Freq: Every day | ORAL | Status: DC
  Administered 2016-08-02 – 2016-08-04 (×4): 80 mg via ORAL
  Filled 2016-08-02 (×4): qty 1

## 2016-08-02 MED ORDER — LEVOTHYROXINE SODIUM 112 MCG PO TABS
112.0000 ug | ORAL_TABLET | Freq: Every day | ORAL | Status: DC
  Administered 2016-08-02 – 2016-08-05 (×4): 112 ug via ORAL
  Filled 2016-08-02 (×4): qty 1

## 2016-08-02 MED ORDER — SODIUM CHLORIDE 0.9 % IV SOLN: 250.0000 mL | INTRAVENOUS | Status: DC | PRN

## 2016-08-02 MED ORDER — LOSARTAN POTASSIUM 50 MG PO TABS
100.0000 mg | ORAL_TABLET | Freq: Every day | ORAL | Status: DC
  Administered 2016-08-02: 100 mg via ORAL
  Filled 2016-08-02: qty 2

## 2016-08-02 MED ORDER — APIXABAN 5 MG PO TABS
5.0000 mg | ORAL_TABLET | Freq: Two times a day (BID) | ORAL | Status: DC
  Administered 2016-08-02 – 2016-08-05 (×8): 5 mg via ORAL
  Filled 2016-08-02 (×9): qty 1

## 2016-08-02 MED ORDER — ZOLPIDEM TARTRATE 5 MG PO TABS
5.0000 mg | ORAL_TABLET | Freq: Every evening | ORAL | Status: DC | PRN
Start: 2016-08-02 — End: 2016-08-05
  Administered 2016-08-02 (×2): 5 mg via ORAL
  Filled 2016-08-02 (×2): qty 1

## 2016-08-02 MED ORDER — ALBUTEROL SULFATE (2.5 MG/3ML) 0.083% IN NEBU: 2.5000 mg | INHALATION_SOLUTION | Freq: Four times a day (QID) | RESPIRATORY_TRACT | Status: DC | PRN

## 2016-08-02 MED ORDER — ALPRAZOLAM 0.25 MG PO TABS: 0.2500 mg | ORAL_TABLET | Freq: Two times a day (BID) | ORAL | Status: DC | PRN

## 2016-08-02 MED ORDER — LATANOPROST 0.005 % OP SOLN
1.0000 [drp] | Freq: Every day | OPHTHALMIC | Status: DC
  Administered 2016-08-03 – 2016-08-04 (×3): 1 [drp] via OPHTHALMIC
  Filled 2016-08-02 (×2): qty 2.5

## 2016-08-02 MED ORDER — INSULIN ASPART 100 UNIT/ML ~~LOC~~ SOLN
0.0000 [IU] | Freq: Three times a day (TID) | SUBCUTANEOUS | Status: DC
  Administered 2016-08-02: 1 [IU] via SUBCUTANEOUS
  Administered 2016-08-02: 2 [IU] via SUBCUTANEOUS
  Administered 2016-08-02: 3 [IU] via SUBCUTANEOUS
  Administered 2016-08-03: 1 [IU] via SUBCUTANEOUS
  Administered 2016-08-03 – 2016-08-04 (×4): 2 [IU] via SUBCUTANEOUS
  Administered 2016-08-04 – 2016-08-05 (×2): 3 [IU] via SUBCUTANEOUS

## 2016-08-02 MED ORDER — FUROSEMIDE 10 MG/ML IJ SOLN
40.0000 mg | Freq: Two times a day (BID) | INTRAMUSCULAR | Status: AC
  Administered 2016-08-02 – 2016-08-04 (×6): 40 mg via INTRAVENOUS
  Filled 2016-08-02 (×6): qty 4

## 2016-08-02 MED ORDER — SODIUM CHLORIDE 0.9% FLUSH: 3.0000 mL | INTRAVENOUS | Status: DC | PRN

## 2016-08-02 NOTE — Progress Notes (Signed)
  Echocardiogram 2D Echocardiogram has been performed.  Mary [name] 08/02/2016, 10:53 AM

## 2016-08-02 NOTE — H&P (Addendum)
History and Physical    Mary [name] [id number] DOB: [birth year] DOA: 08/01/2016  Referring MD/NP/PA:   PCP: Hoyt Koch, MD   Patient coming from:  The patient is coming from home.  At baseline, pt is independent for most of ADL.   Chief Complaint: Shortness of breath, orthopnea, chest pressure, worsening leg edema   HPI: Mary [name] is a 75 y.o. female with medical history significant of dCHF, carotid artery stenosis, nonobstructive CAD, PAF and DVT on Eliquis, hypertension, hyperlipidemia, diabetes mellitus, hypothyroidism, depression, OSA not on CPAP, colon cancer, s/p of pacemaker placement, chronic venous insufficiency, obesity, tobacco abuse, who presents with shortness of breath, orthopnea, chest pressure, worsening leg edema.  Patient states that she has worsening bilateral leg edema in the past 2-3 days. She has shortness of breath which has been progressively getting worse. She has orthopnea. She has dry cough, but no fever or chills. She speaks in full sentence. She reports chest pressure which started last night. Her chest pressure was mild, located in substernal area, constant, nonradiating. It has resolved after 1 dose of nitroglycerin. Currently no chest pressure or chest pain. She has nausea, no vomiting, diarrhea, abdominal pain, symptoms of UTI. No unilateral weakness.  ED Course: pt was found to have BNP 66.3, negative troponin, WBC 9.4, creatinine 1.08, negative chest x-ray, temperature normal, no tachycardia, oxygen saturation 92% on room air, and a urinalysis. Patient is admitted to telemetry bed as inpatient.  Review of Systems:   General: no fevers, chills, has increased body weight, has poor appetite, has fatigue HEENT: no blurry vision, hearing changes or sore throat Respiratory: has dspnea, coughing, no wheezing CV: has chest pressure. no palpitations GI: has nausea, no vomiting, abdominal pain, diarrhea, constipation GU: no dysuria, burning on  urination, increased urinary frequency, hematuria  Ext: has leg edema Neuro: no unilateral weakness, numbness, or tingling, no vision change or hearing loss Skin: no rash, no skin tear. MSK: No muscle spasm, no deformity, no limitation of range of movement in spin Heme: No easy bruising.  Travel history: No recent long distant travel.  Allergy:  Allergies  Allergen Reactions  . Other Other (See Comments)    NO Blind Scopes with Naso Gastric tube.  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 Adhesive [Tape]     Tape - burns - pls use paper tape or elastic bandage  . Aspirin Other (See Comments)    S/P gastric bypass surgery, states her MD told her to not take Aspirin.  . Latex     Tape= burns  . Nsaids Other (See Comments)    S/P gastric bypass-told not to take. GI bleeds with NSAIDS    Past Medical History:  Diagnosis Date  . Arthritis   . CAD (coronary artery disease)    non obstructive  . Carotid stenosis    Carotid US 5/16:  Bilateral ICA 1-39%; L vertebral retrograde; L BP 126/49, R BP 140/57  . CHF (congestive heart failure) ([place])   . Chronic lower back pain   . Colon cancer ([place])    a. s/p chemo  . Colon polyps   . Diabetic peripheral neuropathy ([place])   . DVT (deep venous thrombosis) ([place]) 2013   "twice behind knee on left side" (06/25/2013)  . Glaucoma   . Hyperlipidemia   . Hypertension   . Hypothyroidism   . Iron deficiency anemia   . Multinodular goiter   . Sleep apnea    a. resolved post weight loss   .  Type II diabetes mellitus ([place])     Past Surgical History:  Procedure Laterality Date  . CARDIAC CATHETERIZATION  2006   Never had PCI, 3 caths total. Last one in [place]  . CARDIAC CATHETERIZATION N/A 02/04/2015   Procedure: Left Heart Cath and Coronary Angiography;  Surgeon: Belva Crome, MD;  Location: [facility];  Service: Cardiovascular;  Laterality: N/A;  . CATARACT EXTRACTION, BILATERAL Bilateral 2013   "and put stent in my left eye for glaucoma"  (06/25/2013)  . CHOLECYSTECTOMY  1980's  . COLECTOMY  10/2010   Tumor removal  . EYE MUSCLE SURGERY Bilateral ~ 1963   "muscles too long; eyes would go out and up; tied muscles to hold my eyes straight" (06/25/2013)  . LEAD REVISION  01-03-2014   atrial lead revision by Dr Rayann Heman  . LEAD REVISION N/A 01/03/2014   Procedure: LEAD REVISION;  Surgeon: Coralyn Mark, MD;  Location: [facility];  Service: Cardiovascular;  Laterality: N/A;  . PACEMAKER INSERTION  01/02/2014   STJ Assurity dual chamber pacemaker implnated by Dr Lovena Le for SSS  . PERMANENT PACEMAKER INSERTION N/A 01/02/2014   Procedure: PERMANENT PACEMAKER INSERTION;  Surgeon: Evans Lance, MD;  Location: [facility] CATH [facility];  Service: Cardiovascular;  Laterality: N/A;  . PORTACATH PLACEMENT Left 11/2010  . ROUX-EN-Y GASTRIC BYPASS  2009  . ULNAR TUNNEL RELEASE Left ~ 2008  . UMBILICAL HERNIA REPAIR  1970's?    (06/25/2013)    Social History:  reports that she has been smoking Cigarettes.  She has a 30.00 pack-year smoking history. She has never used smokeless tobacco. She reports that she drinks alcohol. She reports that she does not use drugs.  Family History:  Family History  Problem Relation Age of Onset  . Breast cancer Mother   . Diabetes Mother   . Heart disease Father   . Heart attack Father   . Heart attack Sister   . Heart attack Brother   . Breast cancer Sister   . Breast cancer Maternal Aunt     x 2 aunts  . Pancreatic cancer Brother   . Prostate cancer Brother   . Colon polyps Brother   . Bladder Cancer Cousin   . Clotting disorder Sister   . Diabetes Sister     x 3  . Diabetes Maternal Grandmother      Prior to Admission medications   Medication Sig Start Date End Date Taking? Authorizing Provider  atorvastatin (LIPITOR) 80 MG tablet take 1 tablet by mouth once daily Patient taking differently: take 1 tablet by mouth once daily at bedtime 04/23/16  Yes Hoyt Koch, MD  bimatoprost (LUMIGAN) 0.01 %  SOLN Place 1 drop into the right eye at bedtime.    Yes Historical Provider, MD  buPROPion (WELLBUTRIN XL) 150 MG 24 hr tablet Take 1 tablet (150 mg total) by mouth daily. 11/25/15  Yes Hoyt Koch, MD  Capsaicin-Menthol (SALONPAS GEL EX) Apply 1 application topically 3 (three) times daily as needed (pain).   Yes Historical Provider, MD  ELIQUIS 5 MG TABS tablet take 1 tablet by mouth twice a day 04/09/16  Yes [name], MD  furosemide (LASIX) 40 MG tablet Take 1 tablet (40 mg total) by mouth daily. [id number]  Yes Delora Fuel, MD  gabapentin (NEURONTIN) 300 MG capsule Take 300 mg by mouth 2 (two) times daily.    Yes Historical Provider, MD  HYDROcodone-acetaminophen (NORCO/VICODIN) 5-325 MG tablet Take 1 tablet by mouth every  6 (six) hours as needed for moderate pain. 07/09/16  Yes Hoyt Koch, MD  insulin glargine (LANTUS) 100 unit/mL SOPN Inject 24 Units into the skin at bedtime.   Yes Historical Provider, MD  levothyroxine (SYNTHROID, LEVOTHROID) 112 MCG tablet Take 112 mcg by mouth daily before breakfast.   Yes Historical Provider, MD  losartan-hydrochlorothiazide (HYZAAR) 100-25 MG tablet Take 1 tablet by mouth daily. Patient taking differently: Take 1 tablet by mouth at bedtime.  08/04/15  Yes Hoyt Koch, MD  metFORMIN (GLUCOPHAGE) 1000 MG tablet Take 1 tablet (1,000 mg total) by mouth 2 (two) times daily with a meal. 08/28/15  Yes Hoyt Koch, MD  nitroGLYCERIN (NITROSTAT) 0.4 MG SL tablet Place 1 tablet (0.4 mg total) under the tongue every 5 (five) minutes as needed for chest pain. 01/13/15  Yes [name], MD    Physical Exam: Vitals:   08/01/16 2315 08/01/16 2330 08/02/16 0000 08/02/16 0030  BP: 134/65 (!) 134/38 122/100 123/98  Pulse: 63 (!) 59 64 68  Resp: 19 14 16 23   Temp:      SpO2: 96% 94% 97% 98%  Weight:       General: Not in acute distress HEENT:       Eyes: PERRL, EOMI, no scleral icterus.       ENT: No discharge from the ears and  nose, no pharynx injection, no tonsillar enlargement.        Neck: positive JVD, no bruit, no mass felt. Heme: No neck lymph node enlargement. Cardiac: S1/S2, RRR, No murmurs, No gallops or rubs. Respiratory: has rales bilaterally. No wheezing, rhonchi or rubs. GI: Soft, nondistended, nontender, no rebound pain, no organomegaly, BS present. GU: No hematuria Ext: 3+ pitting leg edema bilaterally. Has venous insufficiency changes. 2+DP/PT pulse bilaterally. Musculoskeletal: No joint deformities, No joint redness or warmth, no limitation of ROM in spin. Skin: No rashes.  Neuro: Alert, oriented X3, cranial nerves II-XII grossly intact, moves all extremities normally. Psych: Patient is not psychotic, no suicidal or hemocidal ideation.  Labs on Admission: I have personally reviewed following labs and imaging studies  CBC:  Recent Labs Lab 08/01/16 1929  WBC 9.4  HGB 11.7*  HCT 35.9*  MCV 89.1  PLT [id number]   Basic Metabolic Panel:  Recent Labs Lab 08/01/16 1929  NA 133*  K 4.1  CL 94*  CO2 28  GLUCOSE 113*  BUN 21*  CREATININE 1.08*  CALCIUM 9.3   GFR: Estimated Creatinine Clearance: 58.9 mL/min (by C-G formula based on SCr of 1.08 mg/dL (H)). Liver Function Tests: No results for input(s): AST, ALT, ALKPHOS, BILITOT, PROT, ALBUMIN in the last 168 hours. No results for input(s): LIPASE, AMYLASE in the last 168 hours. No results for input(s): AMMONIA in the last 168 hours. Coagulation Profile: No results for input(s): INR, PROTIME in the last 168 hours. Cardiac Enzymes: No results for input(s): CKTOTAL, CKMB, CKMBINDEX, TROPONINI in the last 168 hours. BNP (last 3 results) No results for input(s): PROBNP in the last 8760 hours. HbA1C: No results for input(s): HGBA1C in the last 72 hours. CBG: No results for input(s): GLUCAP in the last 168 hours. Lipid Profile: No results for input(s): CHOL, HDL, LDLCALC, TRIG, CHOLHDL, LDLDIRECT in the last 72 hours. Thyroid Function  Tests: No results for input(s): TSH, T4TOTAL, FREET4, T3FREE, THYROIDAB in the last 72 hours. Anemia Panel: No results for input(s): VITAMINB12, FOLATE, FERRITIN, TIBC, IRON, RETICCTPCT in the last 72 hours. Urine analysis:    Component Value  Date/Time   COLORURINE YELLOW 07/08/2016 0339   APPEARANCEUR CLOUDY (A) 07/08/2016 0339   LABSPEC 1.008 07/08/2016 0339   PHURINE 5.5 07/08/2016 0339   GLUCOSEU NEGATIVE 07/08/2016 0339   HGBUR NEGATIVE 07/08/2016 0339   BILIRUBINUR NEGATIVE 07/08/2016 0339   KETONESUR NEGATIVE 07/08/2016 0339   PROTEINUR NEGATIVE 07/08/2016 0339   NITRITE NEGATIVE 07/08/2016 0339   LEUKOCYTESUR MODERATE (A) 07/08/2016 0339   Sepsis Labs: @LABRCNTIP (procalcitonin:4,lacticidven:4) )No results found for this or any previous visit (from the past 240 hour(s)).   Radiological Exams on Admission: Dg Chest 2 View  Result Date: 08/01/2016 CLINICAL DATA:  Pt c/o "flopping feeling in my chest," bilateral leg swelling with pain in both legs. Took a nitro around 4pm that helped with chest discomfort. Pt seen in November and diagnosed with CHF. Weight loss and nausea. History of diabetes, hypertension and smoking. EXAM: CHEST  2 VIEW COMPARISON:  07/07/2016 and 01/27/2015. FINDINGS: Left subclavian pacemaker leads are unchanged within the right atrium and right ventricle. The heart size and mediastinal contours are stable. There is aortic atherosclerosis. There is improved aeration of the lungs which are clear. No pleural effusion or pneumothorax. The bones appear unchanged. Surgical clips are present in the epigastric region. IMPRESSION: No active cardiopulmonary process. Electronically Signed   By: Richardean Sale M.D.   On: 08/01/2016 21:02     EKG: Independently reviewed.  Please rhythm, QTC 529, LAD, anteroseptal infarction pattern    Assessment/Plan Principal Problem:   Acute on chronic diastolic CHF (congestive heart failure) (HCC) Active Problems:   Obesity    CAD (coronary artery disease)   Diabetes mellitus with complication (HCC)   Hypothyroidism   Hypertension   Hyperlipidemia   PAF (paroxysmal atrial fibrillation) (HCC)   Cardiac pacemaker in situ   Chronic venous insufficiency   Depression   Chest pressure   DVT (deep vein thrombosis) in pregnancy ([place])   Tobacco abuse   Acute on chronic diastolic CHF (congestive heart failure) [facility]): Patient shortness of breath, worsening leg edema, positive JVD, rales on auscultation, consistent with CHF exacerbation. Her BNP is 66.3, which is likely falsely low due to obesity. 2-D echo on 01/01/13 showed EF 55-60%.   -will admit to tele bed as inpt -Lasix IV: pt received 60 mg IV lasix in ED, will continue 40 mg bid by IV -trop x 3 -2d echo -start lisinopril 5 mg daily -Daily weights -strict I/O's -Low salt diet  Chest pressure and hx of nonobstructive CAD: has resolved. Likely due to demand ischemia secondary to CHF exacerbation. -Follow-up troponin 3 -prn NTG -Risk factor stratification: A1c, FLP  DVT: -on Eliquis  Atrial Fibrillation: CHA2DS2-VASc Score is 7, needs oral anticoagulation. Patient is on Eliquis at home. Heart rate is well controlled without AV bloker. -tele monitoring -continue Eliquis  Hypothyroidism: Last TSH was 0.314 on 01/27/15 -Continue home Synthroid  HTN: -continue Hyzarr -On IV lasix as above  HLD: Last LDL was 114 on 01/28/59 -Continue home medications: Lipitor  DM-II: Last A1c 8.8 on 03/23/16, poorly controled. Patient is taking metformin and Lantus at home -will decrease Lantus dose from 24 to 16 units daily  -SSI  Tobacco abuse: -Did counseling about importance of quitting smoking -Nicotine patch   DVT ppx: On Eliquis Code Status: Full code Family Communication: Yes, patient's husband at bed side Disposition Plan:  Anticipate discharge back to previous home environment Consults called: EDP discussed with card, recommended admission to our  service (not formally consulted).  Admission status: Inpatient/tele  Date of Service 08/02/2016    Ivor Costa [facility] Pager [phone]  If 7PM-7AM, please contact night-coverage [email or web address] Password [email or web address] 08/02/2016, 1:26 AM

## 2016-08-02 NOTE — Progress Notes (Signed)
Patient seen and examined. Feels a whole lot better than yesterday  Brief summary 75 y.o. female with medical history significant of dCHF, carotid artery stenosis, nonobstructive CAD status postcardiac 2016, PAF and DVT on Eliquis, hypertension, hyperlipidemia, diabetes mellitus, hypothyroidism, depression, OSA not on CPAP, colon cancer, s/p of pacemaker placement, chronic venous insufficiency, obesity, tobacco abuse, who presents with shortness of breath, orthopnea, chest pressure, worsening leg edema. Most recent EF 55-60% noncardiac In 2016  Assessment and plan Acute on chronic diastolic CHF (congestive heart failure) [facility]): Patient shortness of breath, worsening leg edema, positive JVD, rales on auscultation, consistent with CHF exacerbation. Her BNP is 66.3, which is likely falsely low due to obesity. 2-D echo on 01/01/13 showed EF 55-60%. EF 2016 noncardiac Was about the same Continue telemetry -Lasix IV: pt received 60 mg IV lasix in ED, will continue 40 mg bid by IV -trop x 3-negative -2d echo-pending Hold ACE inhibitor pending clarification of PAF -Daily weights -strict I/O's -Low salt diet: Could consider cardiology consult after echo results are available    CAD: has resolved. Likely due to demand ischemia secondary to CHF exacerbation. -Follow-up troponin 3-negative -prn NTG -Risk factor stratification: A1c, LDL 90 Patient is followed by Dr. [name]  DVT: -on Eliquis  Atrial Fibrillation: CHA2DS2-VASc Score is 7, needs oral anticoagulation. Patient is on Eliquis at home. Heart rate is well controlled without AV bloker. -tele monitoring -continue Eliquis  Hypothyroidism: Last TSH was 0.314 on 01/27/15, repeat -Continue home Synthroid   HTN: -continue Hyzarr -On IV lasix as above  HLD:  LDL 90 -Continue home medications: Lipitor  DM-II: Last A1c 8.8 on 03/23/16, poorly controled. Patient is taking metformin and Lantus at home -will decrease Lantus dose from 24 to  16 units daily  -SSI  Tobacco abuse: -Did counseling about importance of quitting smoking -Nicotine patch

## 2016-08-02 NOTE — Care Management Note (Addendum)
Case Management Note  Patient Details  Name: Mary [name] MRN: [id number] Date of Birth: 03/25/1941  Subjective/Objective:                    Action/Plan:  PTA independent from home with husband.  Pt states she uses walker when she is outside of the home and cane inside the home.  Pt states she has to get batteries for her scale and acknowledged the importance of daily weight for fluid concerns.  Pt stated that she "tries" to adhere to low sodium diet.  CM ordered PT/OT eval to be completed when pt is medically stable.   CM will continue to follow for discharge planning.   Expected Discharge Date:                  Expected Discharge Plan:  Home/Self Care  In-House Referral:     Discharge planning Services  CM Consult  Post Acute Care Choice:    Choice offered to:     DME Arranged:    DME Agency:     HH Arranged:    HH Agency:     Status of Service:  In process, will continue to follow  If discussed at Long Length of Stay Meetings, dates discussed:    Additional Comments:  [name], RN 08/02/2016, 3:53 PM

## 2016-08-03 DIAGNOSIS — I5032 Chronic diastolic (congestive) heart failure: Secondary | ICD-10-CM

## 2016-08-03 DIAGNOSIS — R079 Chest pain, unspecified: Secondary | ICD-10-CM

## 2016-08-03 LAB — GLUCOSE, CAPILLARY
GLUCOSE-CAPILLARY: 185 mg/dL — AB (ref 65–99)
GLUCOSE-CAPILLARY: 200 mg/dL — AB (ref 65–99)
Glucose-Capillary: 176 mg/dL — ABNORMAL HIGH (ref 65–99)
Glucose-Capillary: 163 mg/dL — ABNORMAL HIGH (ref 65–99)

## 2016-08-03 LAB — HEMOGLOBIN A1C
HEMOGLOBIN A1C: 9.1 % — AB (ref 4.8–5.6)
Mean Plasma Glucose: 214 mg/dL

## 2016-08-03 LAB — COMPREHENSIVE METABOLIC PANEL
ALBUMIN: 3.2 g/dL — AB (ref 3.5–5.0)
ALT: 13 U/L — AB (ref 14–54)
AST: 15 U/L (ref 15–41)
Alkaline Phosphatase: 65 U/L (ref 38–126)
Anion gap: 7 (ref 5–15)
BUN: 23 mg/dL — AB (ref 6–20)
CHLORIDE: 88 mmol/L — AB (ref 101–111)
CO2: 34 mmol/L — AB (ref 22–32)
CREATININE: 1.28 mg/dL — AB (ref 0.44–1.00)
Calcium: 8.9 mg/dL (ref 8.9–10.3)
GFR calc Af Amer: 47 mL/min — ABNORMAL LOW (ref >60)
GFR, EST NON AFRICAN AMERICAN: 40 mL/min — AB (ref >60)
GLUCOSE: 160 mg/dL — AB (ref 65–99)
POTASSIUM: 3.4 mmol/L — AB (ref 3.5–5.1)
SODIUM: 129 mmol/L — AB (ref 135–145)
Total Bilirubin: 0.5 mg/dL (ref 0.3–1.2)
Total Protein: 6.5 g/dL (ref 6.5–8.1)

## 2016-08-03 MED ORDER — POTASSIUM CHLORIDE CRYS ER 20 MEQ PO TBCR: 20.0000 meq | EXTENDED_RELEASE_TABLET | Freq: Every day | ORAL | 0 refills | Status: DC

## 2016-08-03 MED ORDER — POTASSIUM CHLORIDE CRYS ER 20 MEQ PO TBCR
40.0000 meq | EXTENDED_RELEASE_TABLET | Freq: Once | ORAL | Status: AC
  Administered 2016-08-03: 40 meq via ORAL
  Filled 2016-08-03: qty 2

## 2016-08-03 MED ORDER — FUROSEMIDE 40 MG PO TABS: 60.0000 mg | ORAL_TABLET | Freq: Every day | ORAL | 1 refills | Status: DC

## 2016-08-03 MED ORDER — NICOTINE 21 MG/24HR TD PT24: 21.0000 mg | MEDICATED_PATCH | Freq: Every day | TRANSDERMAL | 0 refills | Status: DC

## 2016-08-03 NOTE — Evaluation (Signed)
Physical Therapy Evaluation Patient Details Name: Mary [name] MRN: [id number] DOB: [birth year] Today's Date: 08/03/2016   History of Present Illness   AMANII SCHMAL is a 75 y.o. female with medical history significant of dCHF, carotid artery stenosis, nonobstructive CAD, PAF and DVT on Eliquis, hypertension, hyperlipidemia, diabetes mellitus, hypothyroidism, depression, OSA not on CPAP, colon cancer, s/p of pacemaker placement, chronic venous insufficiency, obesity, tobacco abuse, who presents with shortness of breath, orthopnea, chest pressure, worsening leg edema.  Clinical Impression  Pt functioning near baseline. Pt with limited ambulation tolerance due to "bad knees." Pt has rollator at home but doesn't fit in hallways of house and has been using cane but has had falls. Pt to benefit from 2 wheeled rolling walker for house ambulation to improve safety and minimize falls risk. Acute PT to follow.    Follow Up Recommendations No PT follow up;Supervision - Intermittent    Equipment Recommendations  3in1 (PT);Rolling walker with 5" wheels    Recommendations for Other Services       Precautions / Restrictions Precautions Precautions: Fall Restrictions Weight Bearing Restrictions: No      Mobility  Bed Mobility Overal bed mobility: Modified Independent             General bed mobility comments: increased time, flat bed, no use of bed rail but pulled on edge of bed  Transfers Overall transfer level: Needs assistance Equipment used: Rolling walker (2 wheeled) Transfers: Sit to/from Stand Sit to Stand: Supervision         General transfer comment: v/c's for safe hand placement, to push up from bed/chair  Ambulation/Gait Ambulation/Gait assistance: Min guard Ambulation Distance (Feet): 50 Feet (x2) Assistive device: Rolling walker (2 wheeled) Gait Pattern/deviations: Step-through pattern;Decreased stride length Gait velocity: slow Gait velocity interpretation: Below  normal speed for age/gender General Gait Details: pt with increased dependence on UEs, requires seated rest break at 50'. pt SPO2 at 95% on RA  Stairs            Wheelchair Mobility    Modified Rankin (Stroke Patients Only)       Balance Overall balance assessment: Needs assistance         Standing balance support: Bilateral upper extremity supported Standing balance-Leahy Scale: Poor Standing balance comment: needs external support                             Pertinent Vitals/Pain Pain Assessment: No/denies pain    Home Living Family/patient expects to be discharged to:: Private residence Living Arrangements: Spouse/significant other Available Help at Discharge: Family;Available 24 hours/day Type of Home: House Home Access: Stairs to enter Entrance Stairs-Rails: None Entrance Stairs-Number of Steps: 1 Home Layout: One level Home Equipment: Environmental consultant - 4 wheels      Prior Function Level of Independence: Independent with assistive device(s)         Comments: used cane in house and rollator in community     Hand Dominance   Dominant Hand: Right    Extremity/Trunk Assessment   Upper Extremity Assessment: Overall WFL for tasks assessed           Lower Extremity Assessment: Generalized weakness      Cervical / Trunk Assessment: Normal  Communication   Communication: No difficulties  Cognition Arousal/Alertness: Awake/alert Behavior During Therapy: WFL for tasks assessed/performed Overall Cognitive Status: Within Functional Limits for tasks assessed  General Comments      Exercises     Assessment/Plan    PT Assessment Patient needs continued PT services  PT Problem List Decreased strength;Decreased activity tolerance;Decreased balance;Decreased mobility          PT Treatment Interventions DME instruction;Gait training;Stair training;Functional mobility training;Therapeutic activities;Therapeutic  exercise;Balance training    PT Goals (Current goals can be found in the Care Plan section)  Acute Rehab PT Goals Patient Stated Goal: home PT Goal Formulation: With patient Time For Goal Achievement: 08/10/16 Potential to Achieve Goals: Good    Frequency Min 3X/week   Barriers to discharge        Co-evaluation               End of Session Equipment Utilized During Treatment: Gait belt Activity Tolerance: Patient tolerated treatment well Patient left: in bed;with call bell/phone within reach Nurse Communication: Mobility status         Time: [id number] PT Time Calculation (min) (ACUTE ONLY): 21 min   Charges:   PT Evaluation $PT Eval Low Complexity: 1 Procedure     PT G Codes:        Pegge Cumberledge M Takyah Ciaramitaro 08/03/2016, 12:26 PM   Kittie Plater, PT, DPT Pager #: [phone] Office #: [phone]

## 2016-08-03 NOTE — Progress Notes (Signed)
Inpatient Diabetes Program Recommendations  AACE/ADA: New Consensus Statement on Inpatient Glycemic Control (2015)  Target Ranges:  Prepandial:   less than 140 mg/dL      Peak postprandial:   less than 180 mg/dL (1-2 hours)      Critically ill patients:  140 - 180 mg/dL   Lab Results  Component Value Date   GLUCAP 163 (H) 08/03/2016   HGBA1C 9.1 (H) 08/02/2016    Review of Glycemic Control  Home meds: Lantus 24 units QHS Metformin 1000 mg bid Orders: Novolog sensitive tidwc.  Blood sugars acceptable. Spoke briefly with pt regarding HgbA1C results. Pt states she sees Dr. Buddy Duty for diabetes management and was aware of her results. States she has had multiple steroid injections for her back and has not been cooking for the last few months. Eating too many sandwiches and blood sugars are running high. Plans to make changes with diet and f/u with Dr. Buddy Duty after d/c.  Will follow. Thank you. Lorenda Peck, RD, LDN, CDE Inpatient Diabetes Coordinator [phone]

## 2016-08-03 NOTE — Discharge Summary (Addendum)
Physician Discharge Summary  Mary [name] MRN: [id number] DOB/AGE: 1941-08-27 75 y.o.  PCP: Hoyt Koch, MD   Admit date: 08/01/2016 Discharge date: 08/03/2016  Discharge Diagnoses:    Principal Problem:   Acute on chronic diastolic CHF (congestive heart failure) (HCC) Active Problems:   Obesity   CAD (coronary artery disease)   Diabetes mellitus with complication (HCC)   Hypothyroidism   Hypertension   Hyperlipidemia   PAF (paroxysmal atrial fibrillation) (HCC)   Cardiac pacemaker in situ   Chronic venous insufficiency   Depression   Chest pressure   DVT (deep vein thrombosis) in pregnancy ([place])   Tobacco abuse   Addendum discharge on hold pending cardiology clearance    Follow-up recommendations Follow-up with PCP in 3-5 days , including all  additional recommended appointments as below Follow-up CBC, CMP in 3-5 days Discontinued losartan HCTZ secondary to low blood pressure/hyponatremia      Current Discharge Medication List    START taking these medications   Details  nicotine (NICODERM CQ - DOSED IN MG/24 HOURS) 21 mg/24hr patch Place 1 patch (21 mg total) onto the skin daily. Qty: 28 patch, Refills: 0    potassium chloride SA (K-DUR,KLOR-CON) 20 MEQ tablet Take 1 tablet (20 mEq total) by mouth daily. Qty: 30 tablet, Refills: 0      CONTINUE these medications which have CHANGED   Details  furosemide (LASIX) 40 MG tablet Take 1.5 tablets (60 mg total) by mouth daily. Qty: 90 tablet, Refills: 1      CONTINUE these medications which have NOT CHANGED   Details  atorvastatin (LIPITOR) 80 MG tablet take 1 tablet by mouth once daily Qty: 30 tablet, Refills: 6    bimatoprost (LUMIGAN) 0.01 % SOLN Place 1 drop into the right eye at bedtime.    Associated Diagnoses: Anemia, unspecified; Malignant neoplasm of colon, unspecified site    buPROPion (WELLBUTRIN XL) 150 MG 24 hr tablet Take 1 tablet (150 mg total) by mouth daily. Qty: 30 tablet,  Refills: 6    Capsaicin-Menthol (SALONPAS GEL EX) Apply 1 application topically 3 (three) times daily as needed (pain).    ELIQUIS 5 MG TABS tablet take 1 tablet by mouth twice a day Qty: 180 tablet, Refills: 3    gabapentin (NEURONTIN) 300 MG capsule Take 300 mg by mouth 2 (two) times daily.    Associated Diagnoses: Anemia, unspecified; Malignant neoplasm of colon, unspecified site    HYDROcodone-acetaminophen (NORCO/VICODIN) 5-325 MG tablet Take 1 tablet by mouth every 6 (six) hours as needed for moderate pain. Qty: 45 tablet, Refills: 0    insulin glargine (LANTUS) 100 unit/mL SOPN Inject 24 Units into the skin at bedtime.    levothyroxine (SYNTHROID, LEVOTHROID) 112 MCG tablet Take 112 mcg by mouth daily before breakfast.    metFORMIN (GLUCOPHAGE) 1000 MG tablet Take 1 tablet (1,000 mg total) by mouth 2 (two) times daily with a meal. Qty: 180 tablet, Refills: 3   Associated Diagnoses: Anemia, unspecified; Malignant neoplasm of colon, unspecified part of colon (HCC)    nitroGLYCERIN (NITROSTAT) 0.4 MG SL tablet Place 1 tablet (0.4 mg total) under the tongue every 5 (five) minutes as needed for chest pain. Qty: 90 tablet, Refills: 3      STOP taking these medications     losartan-hydrochlorothiazide (HYZAAR) 100-25 MG tablet          Discharge Condition:Stable Discharge Instructions Get Medicines reviewed and adjusted: Please take all your medications with you for your next visit with  your Primary MD  Please request your Primary MD to go over all hospital tests and procedure/radiological results at the follow up, please ask your Primary MD to get all Hospital records sent to his/her office.  If you experience worsening of your admission symptoms, develop shortness of breath, life threatening emergency, suicidal or homicidal thoughts you must seek medical attention immediately by calling 911 or calling your MD immediately if symptoms less severe.  You must read complete  instructions/literature along with all the possible adverse reactions/side effects for all the Medicines you take and that have been prescribed to you. Take any new Medicines after you have completely understood and accpet all the possible adverse reactions/side effects.   Do not drive when taking Pain medications.   Do not take more than prescribed Pain, Sleep and Anxiety Medications  Special Instructions: If you have smoked or chewed Tobacco in the last 2 yrs please stop smoking, stop any regular Alcohol and or any Recreational drug use.  Wear Seat belts while driving.  Please note  You were cared for by a hospitalist during your hospital stay. Once you are discharged, your primary care physician will handle any further medical issues. Please note that NO REFILLS for any discharge medications will be authorized once you are discharged, as it is imperative that you return to your primary care physician (or establish a relationship with a primary care physician if you do not have one) for your aftercare needs so that they can reassess your need for medications and monitor your lab values.     Allergies  Allergen Reactions  . Other Other (See Comments)    NO Blind Scopes with Naso Gastric tube.  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 Adhesive [Tape]     Tape - burns - pls use paper tape or elastic bandage  . Aspirin Other (See Comments)    S/P gastric bypass surgery, states her MD told her to not take Aspirin.  . Latex     Tape= burns  . Nsaids Other (See Comments)    S/P gastric bypass-told not to take. GI bleeds with NSAIDS      Disposition: 01-Home or Self Care   Consults:  Cardiology    Significant Diagnostic Studies:  Dg Chest 2 View  Result Date: 08/01/2016 CLINICAL DATA:  Pt c/o "flopping feeling in my chest," bilateral leg swelling with pain in both legs. Took a nitro around 4pm that helped with chest discomfort. Pt seen in November and diagnosed with CHF. Weight loss  and nausea. History of diabetes, hypertension and smoking. EXAM: CHEST  2 VIEW COMPARISON:  07/07/2016 and 01/27/2015. FINDINGS: Left subclavian pacemaker leads are unchanged within the right atrium and right ventricle. The heart size and mediastinal contours are stable. There is aortic atherosclerosis. There is improved aeration of the lungs which are clear. No pleural effusion or pneumothorax. The bones appear unchanged. Surgical clips are present in the epigastric region. IMPRESSION: No active cardiopulmonary process. Electronically Signed   By: Richardean Sale M.D.   On: 08/01/2016 21:02   Dg Chest 2 View  Result Date: 07/07/2016 CLINICAL DATA:  Bilateral lower leg and feet swelling with redness in the left great toe for a few days. History of congestive heart failure, hypertension, atrial fibrillation. EXAM: CHEST  2 VIEW COMPARISON:  01/27/2015 FINDINGS: Cardiac pacemaker. Surgical clips in the left upper quadrant. Heart size and pulmonary vascularity are normal. No focal airspace disease or consolidation in the lungs. No blunting of costophrenic angles.  No pneumothorax. Mediastinal contours appear intact. Calcified and tortuous aorta. IMPRESSION: No active cardiopulmonary disease. Electronically Signed   By: Lucienne Capers M.D.   On: 07/07/2016 23:37     2-D echo LV EF: 60% -   65%  ------------------------------------------------------------------- Indications:      428.0 CHF.  ------------------------------------------------------------------- History:   PMH:   Pressure.  Atrial fibrillation.  Coronary artery disease.  Risk factors:  Hypertension. Diabetes mellitus. Obese. Dyslipidemia.  ------------------------------------------------------------------- Study Conclusions  - Left ventricle: The cavity size was normal. Wall thickness was   increased in a pattern of mild LVH. Systolic function was normal.   The estimated ejection fraction was in the range of 60% to 65%.   Wall  motion was normal; there were no regional wall motion   abnormalities. - Mitral valve: Mildly to moderately calcified annulus. - Left atrium: The atrium was mildly dilated. - Right atrium: The atrium was mildly dilated. - Pulmonary arteries: Systolic pressure was mildly increased. PA   peak pressure: 36 mm Hg     Filed Weights   08/01/16 1925 08/02/16 0225 08/03/16 0543  Weight: 111.9 kg (246 lb 12.8 oz) 113.5 kg (250 lb 3.2 oz) 113.4 kg (250 lb 1.6 oz)     Microbiology: Recent Results (from the past 240 hour(s))  MRSA PCR Screening     Status: None   Collection Time: 08/02/16  1:43 AM  Result Value Ref Range Status   MRSA by PCR NEGATIVE NEGATIVE Final    Comment:        The GeneXpert MRSA Assay (FDA approved for NASAL specimens only), is one component of a comprehensive MRSA colonization surveillance program. It is not intended to diagnose MRSA infection nor to guide or monitor treatment for MRSA infections.        Blood Culture No results found for: SDES, SPECREQUEST, CULT, REPTSTATUS    Labs: Results for orders placed or performed during the hospital encounter of 08/01/16 (from the past 48 hour(s))  Basic metabolic panel     Status: Abnormal   Collection Time: 08/01/16  7:29 PM  Result Value Ref Range   Sodium 133 (L) 135 - 145 mmol/L   Potassium 4.1 3.5 - 5.1 mmol/L   Chloride 94 (L) 101 - 111 mmol/L   CO2 28 22 - 32 mmol/L   Glucose, Bld 113 (H) 65 - 99 mg/dL   BUN 21 (H) 6 - 20 mg/dL   Creatinine, Ser 1.08 (H) 0.44 - 1.00 mg/dL   Calcium 9.3 8.9 - 10.3 mg/dL   GFR calc non Af Amer 49 (L) >60 mL/min   GFR calc Af Amer 57 (L) >60 mL/min    Comment: (NOTE) The eGFR has been calculated using the CKD EPI equation. This calculation has not been validated in all clinical situations. eGFR's persistently <60 mL/min signify possible Chronic Kidney Disease.    Anion gap 11 5 - 15  CBC     Status: Abnormal   Collection Time: 08/01/16  7:29 PM  Result Value  Ref Range   WBC 9.4 4.0 - 10.5 K/uL   RBC 4.03 3.87 - 5.11 MIL/uL   Hemoglobin 11.7 (L) 12.0 - 15.0 g/dL   HCT 35.9 (L) 36.0 - 46.0 %   MCV 89.1 78.0 - 100.0 fL   MCH 29.0 26.0 - 34.0 pg   MCHC 32.6 30.0 - 36.0 g/dL   RDW 13.8 11.5 - 15.5 %   Platelets 331 150 - 400 K/uL  Brain natriuretic peptide  Status: None   Collection Time: 08/01/16  7:29 PM  Result Value Ref Range   B Natriuretic Peptide 66.3 0.0 - 100.0 pg/mL  I-stat troponin, ED     Status: None   Collection Time: 08/01/16  7:37 PM  Result Value Ref Range   Troponin i, poc 0.00 0.00 - 0.08 ng/mL   Comment 3            Comment: Due to the release kinetics of cTnI, a negative result within the first hours of the onset of symptoms does not rule out myocardial infarction with certainty. If myocardial infarction is still suspected, repeat the test at appropriate intervals.   Hemoglobin A1c     Status: Abnormal   Collection Time: 08/02/16  1:02 AM  Result Value Ref Range   Hgb A1c MFr Bld 9.1 (H) 4.8 - 5.6 %    Comment: (NOTE)         Pre-diabetes: 5.7 - 6.4         Diabetes: >6.4         Glycemic control for adults with diabetes: <7.0    Mean Plasma Glucose 214 mg/dL    Comment: (NOTE) Performed At: [facility] [place], [place] [id number] Lindon Romp MD [id number]   Lipid panel     Status: Abnormal   Collection Time: 08/02/16  1:02 AM  Result Value Ref Range   Cholesterol 178 0 - 200 mg/dL   Triglycerides 229 (H) <150 mg/dL   HDL 42 >40 mg/dL   Total CHOL/HDL Ratio 4.2 RATIO   VLDL 46 (H) 0 - 40 mg/dL   LDL Cholesterol 90 0 - 99 mg/dL    Comment:        Total Cholesterol/HDL:CHD Risk Coronary Heart Disease Risk Table                     Men   Women  1/2 Average Risk   3.4   3.3  Average Risk       5.0   4.4  2 X Average Risk   9.6   7.1  3 X Average Risk  23.4   11.0        Use the calculated Patient Ratio above and the CHD Risk Table to determine the patient's CHD  Risk.        ATP III CLASSIFICATION (LDL):  <100     mg/dL   Optimal  100-129  mg/dL   Near or Above                    Optimal  130-159  mg/dL   Borderline  160-189  mg/dL   High  >190     mg/dL   Very High   Troponin I (q 6hr x 3)     Status: None   Collection Time: 08/02/16  1:02 AM  Result Value Ref Range   Troponin I <0.03 <0.03 ng/mL  MRSA PCR Screening     Status: None   Collection Time: 08/02/16  1:43 AM  Result Value Ref Range   MRSA by PCR NEGATIVE NEGATIVE    Comment:        The GeneXpert MRSA Assay (FDA approved for NASAL specimens only), is one component of a comprehensive MRSA colonization surveillance program. It is not intended to diagnose MRSA infection nor to guide or monitor treatment for MRSA infections.   Glucose, capillary     Status: Abnormal   Collection  Time: 08/02/16  6:37 AM  Result Value Ref Range   Glucose-Capillary 151 (H) 65 - 99 mg/dL  Troponin I (q 6hr x 3)     Status: None   Collection Time: 08/02/16  7:19 AM  Result Value Ref Range   Troponin I <0.03 <0.03 ng/mL  TSH     Status: Abnormal   Collection Time: 08/02/16 11:27 AM  Result Value Ref Range   TSH 4.896 (H) 0.350 - 4.500 uIU/mL    Comment: Performed by a 3rd Generation assay with a functional sensitivity of <=0.01 uIU/mL.  Glucose, capillary     Status: Abnormal   Collection Time: 08/02/16 11:34 AM  Result Value Ref Range   Glucose-Capillary 209 (H) 65 - 99 mg/dL   Comment 1 Notify RN    Comment 2 Document in Chart   Troponin I (q 6hr x 3)     Status: None   Collection Time: 08/02/16  1:02 PM  Result Value Ref Range   Troponin I <0.03 <0.03 ng/mL  Glucose, capillary     Status: Abnormal   Collection Time: 08/02/16  4:45 PM  Result Value Ref Range   Glucose-Capillary 124 (H) 65 - 99 mg/dL   Comment 1 Notify RN    Comment 2 Document in Chart   Glucose, capillary     Status: Abnormal   Collection Time: 08/02/16  9:08 PM  Result Value Ref Range   Glucose-Capillary 202  (H) 65 - 99 mg/dL  Comprehensive metabolic panel     Status: Abnormal   Collection Time: 08/03/16  2:01 AM  Result Value Ref Range   Sodium 129 (L) 135 - 145 mmol/L   Potassium 3.4 (L) 3.5 - 5.1 mmol/L    Comment: DELTA CHECK NOTED NO VISIBLE HEMOLYSIS    Chloride 88 (L) 101 - 111 mmol/L   CO2 34 (H) 22 - 32 mmol/L   Glucose, Bld 160 (H) 65 - 99 mg/dL   BUN 23 (H) 6 - 20 mg/dL   Creatinine, Ser 1.28 (H) 0.44 - 1.00 mg/dL   Calcium 8.9 8.9 - 10.3 mg/dL   Total Protein 6.5 6.5 - 8.1 g/dL   Albumin 3.2 (L) 3.5 - 5.0 g/dL   AST 15 15 - 41 U/L   ALT 13 (L) 14 - 54 U/L   Alkaline Phosphatase 65 38 - 126 U/L   Total Bilirubin 0.5 0.3 - 1.2 mg/dL   GFR calc non Af Amer 40 (L) >60 mL/min   GFR calc Af Amer 47 (L) >60 mL/min    Comment: (NOTE) The eGFR has been calculated using the CKD EPI equation. This calculation has not been validated in all clinical situations. eGFR's persistently <60 mL/min signify possible Chronic Kidney Disease.    Anion gap 7 5 - 15  Glucose, capillary     Status: Abnormal   Collection Time: 08/03/16  6:05 AM  Result Value Ref Range   Glucose-Capillary 176 (H) 65 - 99 mg/dL   Comment 1 Notify RN      Lipid Panel     Component Value Date/Time   CHOL 178 08/02/2016 0102   TRIG 229 (H) 08/02/2016 0102   HDL 42 08/02/2016 0102   CHOLHDL 4.2 08/02/2016 0102   VLDL 46 (H) 08/02/2016 0102   LDLCALC 90 08/02/2016 0102   LDLDIRECT 79.0 03/23/2016 1529     Lab Results  Component Value Date   HGBA1C 9.1 (H) 08/02/2016   HGBA1C 8.8 (H) 03/23/2016   HGBA1C 9.0 (H)  01/27/2015        HPI :   75 y.o.femalewith medical history significant of dCHF, carotidartery stenosis, nonobstructive CAD status postcardiac 2016, PAF and DVT on Eliquis, hypertension, hyperlipidemia, diabetes mellitus, hypothyroidism, depression, OSA not on CPAP, colon cancer, s/p of pacemaker placement, chronic venous insufficiency, obesity, tobacco abuse, who presents with shortness  of breath, orthopnea, chest pressure, worsening leg edema. Most recent EF 55-60% noncardiac In 2016  HOSPITAL COURSE:    Acute on chronic diastolic CHF (congestive heart failure) (HCC):Patient shortness of breath, worsening leg edema, positive JVD, rales on auscultation, consistent with CHF exacerbation. Her BNP is 66.3, which is likely falsely low due to obesity. 2-D echo on 01/01/13 showed EF 55-60%. EF 2016 noncardiac Was about the same Continue telemetry telemetry showed paced rhythm, possible PVCs Patient diuresed with IV Lasix, switch to Lasix 60 mg a day -trop x 3-negative -2d echo-results as above Cardiology consult to interrogate pacemaker  , Question if patient's atrial fibrillation is  exacerbating her heart failure     CAD: has resolved. Likely due to demand ischemia secondary to CHF exacerbation. -Follow-up troponin 3-negative -prn NTG -Risk factor stratification: A1c, LDL 90 Patient is followed by Dr. [name]  DVT: -on Eliquis  Atrial Fibrillation: CHA2DS2-VASc Scoreis 7, needs oral anticoagulation. Patient is on Eliquis at home. Heart rate is well controlled without AV bloker. -tele monitoring shows possible PAF  -continue Eliquis  Hypothyroidism: Last TSH was 0.314 on 01/27/15, repeat TSH 4.9 -Continue home Synthroid   HTN: Discontinue Hyzarr given low blood pressure, low sodium    HLD: LDL 90 -Continue home medications: Lipitor  DM-II:Last A1c 8.8 on 03/23/16, poorly controled. Patient is takingmetformin and Lantusat home  Resume all regimen  Tobacco abuse: -Did counseling about importance of quitting smoking -Nicotine patch  Discharge Exam:   Blood pressure 92/67, pulse (!) 102, temperature 97.9 F (36.6 C), temperature source Oral, resp. rate 18, height _0  (1.702 m), weight 113.4 kg (250 lb 1.6 oz), SpO2 97 %.  Cardiac: S1/S2, RRR, No murmurs, No gallops or rubs. Respiratory: has rales bilaterally. No wheezing, rhonchi or rubs. GI: Soft,  nondistended, nontender, no rebound pain, no organomegaly, BS present. GU: No hematuria Ext: 3+ pitting leg edema bilaterally. Has venous insufficiency changes. 2+DP/PT pulse bilaterally. Musculoskeletal: No joint deformities, No joint redness or warmth, no limitation of ROM in spin.     Follow-up Information    Hoyt Koch, MD. Schedule an appointment as soon as possible for a visit in 3 day(s).   Specialty:  Internal Medicine Why:  hospital follow-up, repeat CBC, BMP in  3-5 days Contact information: [address] [place] [id number] [phone]           Signed: Reyne Dumas 08/03/2016, 8:08 AM        Time spent >45 mins

## 2016-08-04 DIAGNOSIS — I5033 Acute on chronic diastolic (congestive) heart failure: Secondary | ICD-10-CM

## 2016-08-04 LAB — COMPREHENSIVE METABOLIC PANEL
ALBUMIN: 3.4 g/dL — AB (ref 3.5–5.0)
ALT: 13 U/L — ABNORMAL LOW (ref 14–54)
ANION GAP: 10 (ref 5–15)
AST: 15 U/L (ref 15–41)
Alkaline Phosphatase: 75 U/L (ref 38–126)
BILIRUBIN TOTAL: 0.7 mg/dL (ref 0.3–1.2)
BUN: 27 mg/dL — ABNORMAL HIGH (ref 6–20)
CHLORIDE: 86 mmol/L — AB (ref 101–111)
CO2: 34 mmol/L — AB (ref 22–32)
Calcium: 9.1 mg/dL (ref 8.9–10.3)
Creatinine, Ser: 1.27 mg/dL — ABNORMAL HIGH (ref 0.44–1.00)
GFR calc Af Amer: 47 mL/min — ABNORMAL LOW (ref >60)
GFR calc non Af Amer: 41 mL/min — ABNORMAL LOW (ref >60)
GLUCOSE: 171 mg/dL — AB (ref 65–99)
POTASSIUM: 3.3 mmol/L — AB (ref 3.5–5.1)
SODIUM: 130 mmol/L — AB (ref 135–145)
TOTAL PROTEIN: 6.5 g/dL (ref 6.5–8.1)

## 2016-08-04 LAB — CBC
HEMATOCRIT: 35.6 % — AB (ref 36.0–46.0)
HEMOGLOBIN: 11.8 g/dL — AB (ref 12.0–15.0)
MCH: 28.7 pg (ref 26.0–34.0)
MCHC: 33.1 g/dL (ref 30.0–36.0)
MCV: 86.6 fL (ref 78.0–100.0)
Platelets: 317 10*3/uL (ref 150–400)
RBC: 4.11 MIL/uL (ref 3.87–5.11)
RDW: 13 % (ref 11.5–15.5)
WBC: 8.1 10*3/uL (ref 4.0–10.5)

## 2016-08-04 LAB — GLUCOSE, CAPILLARY
GLUCOSE-CAPILLARY: 215 mg/dL — AB (ref 65–99)
Glucose-Capillary: 155 mg/dL — ABNORMAL HIGH (ref 65–99)
Glucose-Capillary: 190 mg/dL — ABNORMAL HIGH (ref 65–99)
Glucose-Capillary: 184 mg/dL — ABNORMAL HIGH (ref 65–99)

## 2016-08-04 MED ORDER — POTASSIUM CHLORIDE CRYS ER 20 MEQ PO TBCR
40.0000 meq | EXTENDED_RELEASE_TABLET | Freq: Once | ORAL | Status: AC
  Administered 2016-08-04: 40 meq via ORAL
  Filled 2016-08-04: qty 2

## 2016-08-04 MED ORDER — FUROSEMIDE 10 MG/ML IJ SOLN
INTRAMUSCULAR | Status: AC
  Filled 2016-08-04: qty 4

## 2016-08-04 MED ORDER — FUROSEMIDE 80 MG PO TABS
80.0000 mg | ORAL_TABLET | Freq: Every day | ORAL | Status: DC
  Administered 2016-08-05: 80 mg via ORAL
  Filled 2016-08-04: qty 1

## 2016-08-04 NOTE — Evaluation (Signed)
Occupational Therapy Evaluation and Discharge Patient Details Name: Mary [name] MRN: [id number] DOB: [birth year] Today's Date: 08/04/2016    History of Present Illness  Mary [name] is a 75 y.o. female with medical history significant of dCHF, carotid artery stenosis, nonobstructive CAD, PAF and DVT on Eliquis, hypertension, hyperlipidemia, diabetes mellitus, hypothyroidism, depression, OSA not on CPAP, colon cancer, s/p of pacemaker placement, chronic venous insufficiency, obesity, tobacco abuse, who presents with shortness of breath, orthopnea, chest pressure, worsening leg edema.   Clinical Impression   Pt reports she was independent with ADL PTA. Currently pt overall min guard for ADL and functional mobility due to knee pain/weakness. Pt without SOB noted during functional mobility/activities this session. Educated pt on home safety strategies and installing grab bars in tub for safety with transfers; pt verbalized understanding. Pt planning to d/c home with 24/7 supervision from family. No further acute OT needs identified; signing off at this time. Please re-consult if needs change. Thank you for this referral.    Follow Up Recommendations  No OT follow up;Supervision - Intermittent    Equipment Recommendations  3 in 1 bedside comode;Other (comment) (grab bars in tub shower)    Recommendations for Other Services       Precautions / Restrictions Precautions Precautions: None Restrictions Weight Bearing Restrictions: No      Mobility Bed Mobility Overal bed mobility: Modified Independent             General bed mobility comments: HOB slightly elevated, no use of bed rails. Increased time required.  Transfers Overall transfer level: Needs assistance Equipment used: Rolling walker (2 wheeled) Transfers: Sit to/from Stand Sit to Stand: Supervision         General transfer comment: Good hand placement and technique, no physical assist needed.    Balance  Overall balance assessment: Needs assistance Sitting-balance support: Feet supported;No upper extremity supported Sitting balance-Leahy Scale: Good     Standing balance support: Bilateral upper extremity supported Standing balance-Leahy Scale: Poor Standing balance comment: RW for support                            ADL Overall ADL's : Needs assistance/impaired Eating/Feeding: Set up;Sitting   Grooming: Supervision/safety;Standing   Upper Body Bathing: Set up;Supervision/ safety;Sitting   Lower Body Bathing: Min guard;Sit to/from stand   Upper Body Dressing : Set up;Supervision/safety;Sitting   Lower Body Dressing: Min guard;Sit to/from stand Lower Body Dressing Details (indicate cue type and reason): Pt able to adjust socks sitting EOB Toilet Transfer: Min guard;Ambulation;BSC;RW Toilet Transfer Details (indicate cue type and reason): Simulated by sit to stand from EOB with functional mobility in room. Toileting- Water quality scientist and Hygiene: Min guard;Sit to/from stand       Functional mobility during ADLs: Min guard;Rolling walker General ADL Comments: Educated pt on home safety and fall prevention strategies. Discussed installing grab bars in tub for safety with transfers; pt reports her and her husband have been planning to do this.     Vision Vision Assessment?: No apparent visual deficits   Perception     Praxis      Pertinent Vitals/Pain Pain Assessment: Faces Faces Pain Scale: Hurts little more Pain Location: bil knees with mobility Pain Descriptors / Indicators: Sore Pain Intervention(s): Limited activity within patient's tolerance;Monitored during session;Repositioned     Hand Dominance Right   Extremity/Trunk Assessment Upper Extremity Assessment Upper Extremity Assessment: Overall WFL for tasks assessed   Lower Extremity  Assessment Lower Extremity Assessment: Defer to PT evaluation   Cervical / Trunk Assessment Cervical / Trunk  Assessment: Normal   Communication Communication Communication: No difficulties   Cognition Arousal/Alertness: Awake/alert Behavior During Therapy: WFL for tasks assessed/performed Overall Cognitive Status: Within Functional Limits for tasks assessed                     General Comments       Exercises       Shoulder Instructions      Home Living Family/patient expects to be discharged to:: Private residence Living Arrangements: Spouse/significant other Available Help at Discharge: Family;Available 24 hours/day Type of Home: Apartment Home Access: Level entry     Home Layout: One level     Bathroom Shower/Tub: Tub/shower unit Shower/tub characteristics: Curtain Biochemist, clinical: Standard     Home Equipment: Environmental consultant - 4 wheels;Shower seat          Prior Functioning/Environment Level of Independence: Independent with assistive device(s)        Comments: used cane in house and rollator in community        OT Problem List:     OT Treatment/Interventions:      OT Goals(Current goals can be found in the care plan section) Acute Rehab OT Goals Patient Stated Goal: home OT Goal Formulation: All assessment and education complete, DC therapy  OT Frequency:     Barriers to D/C:            Co-evaluation              End of Session Equipment Utilized During Treatment: Rolling walker  Activity Tolerance: Patient tolerated treatment well Patient left: with call bell/phone within reach;in bed;Other (comment) (sitting EOB)   Time: [id number] OT Time Calculation (min): 13 min Charges:  OT General Charges $OT Visit: 1 Procedure OT Evaluation $OT Eval Moderate Complexity: 1 Procedure G-Codes:     Binnie Kand M.S., OTR/L Pager: [phone]  08/04/2016, 9:57 AM

## 2016-08-04 NOTE — Progress Notes (Signed)
[facility] PROGRESS NOTE  Mary [name] [id number] DOB: 1941/08/10 DOA: 08/01/2016   PCP: Hoyt Koch, MD     Assessment/Plan: Principal Problem:   Acute on chronic diastolic CHF (congestive heart failure) ([place]) Active Problems:   Obesity   CAD (coronary artery disease)   Diabetes mellitus with complication (HCC)   Hypothyroidism   Hypertension   Hyperlipidemia   PAF (paroxysmal atrial fibrillation) (HCC)   Cardiac pacemaker in situ   Chronic venous insufficiency   Depression   Chest pressure   DVT (deep vein thrombosis) in pregnancy ([place])   Tobacco abuse    75 y.o.femalewith medical history significant of dCHF, carotidartery stenosis, nonobstructive CADstatus postcardiac 2016, PAF and DVT on Eliquis, hypertension, hyperlipidemia, diabetes mellitus, hypothyroidism, depression, OSA not on CPAP, colon cancer, s/p of pacemaker placement, chronic venous insufficiency, obesity, tobacco abuse, who presents with shortness of breath, orthopnea, chest pressure, worsening leg edema. Most recent EF 55-60% noncardiac In 2016  HOSPITAL COURSE:    Acute on chronic diastolic CHF (congestive heart failure) (HCC):Patient shortness of breath, worsening leg edema, positive JVD, rales on auscultation, consistent with CHF exacerbation. Her BNP is 66.3, which is likely falsely low due to obesity. 2-D echo on 01/01/13 showed EF 55-60%. EF 2016 noncardiac Was about the same Continue telemetry telemetry showed paced rhythm, possible PVCs Patient diuresed with IV Lasix, switch to Lasix 60 mg a day -trop x 3-negative -2d echo- LV EF: 60% -   65% Cardiology consult to interrogate pacemaker  , Question if patient's atrial fibrillation is  exacerbating her heart failure Switch to PO diuretics in tomorrow  and possibly ready for DC 12/7 per cardiology notes  CAD: has resolved. Likely due to demand ischemia secondary to CHF exacerbation. -Follow-up troponin  3-negative -prn NTG -Risk factor stratification: A1c, LDL 90 Patient is followed by Dr. [name]  DVT: -on Eliquis  Atrial Fibrillation: CHA2DS2-VASc Scoreis 7, needs oral anticoagulation. Patient is on Eliquis at home. Heart rate is well controlled without AV bloker. -tele monitoring shows possible PAF  -continue Eliquis  Hypothyroidism: Last TSH was 0.314 on 01/27/15, repeat TSH 4.9 -Continue home Synthroid   HTN: Discontinue Hyzarr given low blood pressure, low sodium    HLD:LDL 90 -Continue home medications: Lipitor  DM-II:Last A1c 8.8 on 03/23/16, poorly controled. Patient is takingmetformin and Lantusat home  Resume all regimen  Tobacco abuse: -Did counseling about importance of quitting smoking -Nicotine patch    DVT prophylaxsis eliquis  Code Status:  Full code     Family Communication: Discussed in detail with the patient, all imaging results, lab results explained to the patient   Disposition Plan: in am       Consultants:  cardiology  Procedures:  None   Antibiotics: Anti-infectives    None         HPI/Subjective: Denies any CP,SOB  Objective: Vitals:   08/03/16 1232 08/03/16 2121 08/04/16 0437 08/04/16 0441  BP: (!) 105/49 (!) 122/49  (!) 124/50  Pulse: (!) 56 74  68  Resp: 17 18  18   Temp: 98.7 F (37.1 C) 98.2 F (36.8 C)  98.4 F (36.9 C)  TempSrc: Oral Oral  Oral  SpO2: 98% 93%  96%  Weight:   112.8 kg (248 lb 9.6 oz)   Height:        Intake/Output Summary (Last 24 hours) at 08/04/16 1421 Last data filed at 08/04/16 0900  Gross per 24 hour  Intake  480 ml  Output             2800 ml  Net            -2320 ml    Exam:  Examination:  General exam: Appears calm and comfortable  Respiratory system: Clear to auscultation. Respiratory effort normal. Cardiovascular system: S1 & S2 heard, RRR. No JVD, murmurs, rubs, gallops or clicks. No pedal edema. Gastrointestinal system: Abdomen is  nondistended, soft and nontender. No organomegaly or masses felt. Normal bowel sounds heard. Central nervous system: Alert and oriented. No focal neurological deficits. Extremities: Symmetric 5 x 5 power. Skin: No rashes, lesions or ulcers Psychiatry: Judgement and insight appear normal. Mood & affect appropriate.     Data Reviewed: I have personally reviewed following labs and imaging studies  Micro Results Recent Results (from the past 240 hour(s))  MRSA PCR Screening     Status: None   Collection Time: 08/02/16  1:43 AM  Result Value Ref Range Status   MRSA by PCR NEGATIVE NEGATIVE Final    Comment:        The GeneXpert MRSA Assay (FDA approved for NASAL specimens only), is one component of a comprehensive MRSA colonization surveillance program. It is not intended to diagnose MRSA infection nor to guide or monitor treatment for MRSA infections.     Radiology Reports Dg Chest 2 View  Result Date: 08/01/2016 CLINICAL DATA:  Pt c/o "flopping feeling in my chest," bilateral leg swelling with pain in both legs. Took a nitro around 4pm that helped with chest discomfort. Pt seen in November and diagnosed with CHF. Weight loss and nausea. History of diabetes, hypertension and smoking. EXAM: CHEST  2 VIEW COMPARISON:  07/07/2016 and 01/27/2015. FINDINGS: Left subclavian pacemaker leads are unchanged within the right atrium and right ventricle. The heart size and mediastinal contours are stable. There is aortic atherosclerosis. There is improved aeration of the lungs which are clear. No pleural effusion or pneumothorax. The bones appear unchanged. Surgical clips are present in the epigastric region. IMPRESSION: No active cardiopulmonary process. Electronically Signed   By: Richardean Sale M.D.   On: 08/01/2016 21:02   Dg Chest 2 View  Result Date: 07/07/2016 CLINICAL DATA:  Bilateral lower leg and feet swelling with redness in the left great toe for a few days. History of congestive  heart failure, hypertension, atrial fibrillation. EXAM: CHEST  2 VIEW COMPARISON:  01/27/2015 FINDINGS: Cardiac pacemaker. Surgical clips in the left upper quadrant. Heart size and pulmonary vascularity are normal. No focal airspace disease or consolidation in the lungs. No blunting of costophrenic angles. No pneumothorax. Mediastinal contours appear intact. Calcified and tortuous aorta. IMPRESSION: No active cardiopulmonary disease. Electronically Signed   By: Lucienne Capers M.D.   On: 07/07/2016 23:37     CBC  Recent Labs Lab 08/01/16 1929 08/04/16 1212  WBC 9.4 8.1  HGB 11.7* 11.8*  HCT 35.9* 35.6*  PLT 331 317  MCV 89.1 86.6  MCH 29.0 28.7  MCHC 32.6 33.1  RDW 13.8 13.0    Chemistries   Recent Labs Lab 08/01/16 1929 08/03/16 0201 08/04/16 1212  NA 133* 129* 130*  K 4.1 3.4* 3.3*  CL 94* 88* 86*  CO2 28 34* 34*  GLUCOSE 113* 160* 171*  BUN 21* 23* 27*  CREATININE 1.08* 1.28* 1.27*  CALCIUM 9.3 8.9 9.1  AST  --  15 15  ALT  --  13* 13*  ALKPHOS  --  65 75  BILITOT  --  0.5 0.7   ------------------------------------------------------------------------------------------------------------------ estimated creatinine clearance is 50.4 mL/min (by C-G formula based on SCr of 1.27 mg/dL (H)). ------------------------------------------------------------------------------------------------------------------  Recent Labs  08/02/16 0102  HGBA1C 9.1*   ------------------------------------------------------------------------------------------------------------------  Recent Labs  08/02/16 0102  CHOL 178  HDL 42  LDLCALC 90  TRIG 229*  CHOLHDL 4.2   ------------------------------------------------------------------------------------------------------------------  Recent Labs  08/02/16 1127  TSH 4.896*   ------------------------------------------------------------------------------------------------------------------ No results for input(s): VITAMINB12, FOLATE,  FERRITIN, TIBC, IRON, RETICCTPCT in the last 72 hours.  Coagulation profile No results for input(s): INR, PROTIME in the last 168 hours.  No results for input(s): DDIMER in the last 72 hours.  Cardiac Enzymes  Recent Labs Lab 08/02/16 0102 08/02/16 0719 08/02/16 1302  TROPONINI <0.03 <0.03 <0.03   ------------------------------------------------------------------------------------------------------------------ Invalid input(s): POCBNP   CBG:  Recent Labs Lab 08/03/16 1147 08/03/16 1659 08/03/16 2137 08/04/16 0629 08/04/16 1109  GLUCAP 185* 163* 200* 155* 215*       Studies: No results found.    Lab Results  Component Value Date   HGBA1C 9.1 (H) 08/02/2016   HGBA1C 8.8 (H) 03/23/2016   HGBA1C 9.0 (H) 01/27/2015   Lab Results  Component Value Date   LDLCALC 90 08/02/2016   CREATININE 1.27 (H) 08/04/2016       Scheduled Meds: . apixaban  5 mg Oral BID  . atorvastatin  80 mg Oral QHS  . buPROPion  150 mg Oral Daily  . furosemide  40 mg Intravenous BID  . [START ON 08/05/2016] furosemide  80 mg Oral Daily  . gabapentin  300 mg Oral BID  . insulin aspart  0-9 Units Subcutaneous TID WC  . insulin glargine  16 Units Subcutaneous QHS  . latanoprost  1 drop Both Eyes QHS  . levothyroxine  112 mcg Oral QAC breakfast  . nicotine  21 mg Transdermal Daily  . potassium chloride  40 mEq Oral Once  . sodium chloride flush  3 mL Intravenous Q12H   Continuous Infusions:   LOS: 2 days    Time spent: >30 MINS    [facility]  [facility] Pager [phone]. If 7PM-7AM, please contact night-coverage at [email or web address], password [facility] 08/04/2016, 2:21 PM  LOS: 2 days

## 2016-08-04 NOTE — Progress Notes (Deleted)
    [facility] has been requested to perform a transesophageal echocardiogram on Mary [name] for bacteremia and abnormality on AV on TTE scheduled for 08/05/16 @ 3pm .  After careful review of history and examination, the risks and benefits of transesophageal echocardiogram have been explained including risks of esophageal damage, perforation (1:10,000 risk), bleeding, pharyngeal hematoma as well as other potential complications associated with conscious sedation including aspiration, arrhythmia, respiratory failure and death. Alternatives to treatment were discussed, questions were answered. Discssed with her husband Bernie Covey and he is willing to proceed.   Angelena Form, PA-C  08/04/2016 12:27 PM

## 2016-08-04 NOTE — Consult Note (Signed)
Cardiology Consult    Patient ID: Mary [name] MRN: [id number], DOB/AGE: 01-17-1941   Admit date: 08/01/2016 Date of Consult: 08/04/2016  Primary Physician: Hoyt Koch, MD Reason for Consult: CHF Primary Cardiologist: Dr. [name]  Requesting Provider: Dr. Allyson Sabal   Patient Profile    Mary [name] is a 75 year old female with a past medical history of chronic diastolic CHF, AV node tachybradycardia syndrome s/p PPM ([employer])PAF, DVT ( on Eliquis), HTN, HLD, DM, and OSA. Also with history of CAD, non obstructive by cath in June 2016.   History of Present Illness    Mary [name] presented with worsening bilateral lower extremity edema, SOB and orthopnea. She was diuresed with IV Lasix and is negative 4.4 L so far.  She says that she has been feeling poorly in the past few weeks and hasn't been able to cook much. As a result, she has eaten more fast foods and more canned soups. Echo this admission shows normal LV function, RV was enlarged, PA pressure was 37mmHg.   She is followed by Dr. [name] and was last seen in July of this year, she had some angina while in his office that was relieve by SL Nitro. Her Pacemaker was functioning normally.   Past Medical History   Past Medical History:  Diagnosis Date  . Arthritis   . CAD (coronary artery disease)    non obstructive  . Carotid stenosis    Carotid US 5/16:  Bilateral ICA 1-39%; L vertebral retrograde; L BP 126/49, R BP 140/57  . CHF (congestive heart failure) ([place])   . Chronic lower back pain   . Colon cancer ([place])    a. s/p chemo  . Colon polyps   . Diabetic peripheral neuropathy ([place])   . DVT (deep venous thrombosis) ([place]) 2013   "twice behind knee on left side" (06/25/2013)  . Glaucoma   . Hyperlipidemia   . Hypertension   . Hypothyroidism   . Iron deficiency anemia   . Multinodular goiter   . Sleep apnea    a. resolved post weight loss   . Type II diabetes mellitus ([place])     Past Surgical History:  Procedure  Laterality Date  . CARDIAC CATHETERIZATION  2006   Never had PCI, 3 caths total. Last one in [place]  . CARDIAC CATHETERIZATION N/A 02/04/2015   Procedure: Left Heart Cath and Coronary Angiography;  Surgeon: Belva Crome, MD;  Location: [facility];  Service: Cardiovascular;  Laterality: N/A;  . CATARACT EXTRACTION, BILATERAL Bilateral 2013   "and put stent in my left eye for glaucoma" (06/25/2013)  . CHOLECYSTECTOMY  1980's  . COLECTOMY  10/2010   Tumor removal  . EYE MUSCLE SURGERY Bilateral ~ 1963   "muscles too long; eyes would go out and up; tied muscles to hold my eyes straight" (06/25/2013)  . LEAD REVISION  01-03-2014   atrial lead revision by Dr Rayann Heman  . LEAD REVISION N/A 01/03/2014   Procedure: LEAD REVISION;  Surgeon: Coralyn Mark, MD;  Location: [facility];  Service: Cardiovascular;  Laterality: N/A;  . PACEMAKER INSERTION  01/02/2014   STJ Assurity dual chamber pacemaker implnated by Dr Lovena Le for SSS  . PERMANENT PACEMAKER INSERTION N/A 01/02/2014   Procedure: PERMANENT PACEMAKER INSERTION;  Surgeon: Evans Lance, MD;  Location: [facility];  Service: Cardiovascular;  Laterality: N/A;  . PORTACATH PLACEMENT Left 11/2010  . ROUX-EN-Y GASTRIC BYPASS  2009  . ULNAR TUNNEL  RELEASE Left ~ 2008  . UMBILICAL HERNIA REPAIR  1970's?    (06/25/2013)     Allergies  Allergies  Allergen Reactions  . Other Other (See Comments)    NO Blind Scopes with Naso Gastric tube.  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 Adhesive [Tape]     Tape - burns - pls use paper tape or elastic bandage  . Aspirin Other (See Comments)    S/P gastric bypass surgery, states her MD told her to not take Aspirin.  . Latex     Tape= burns  . Nsaids Other (See Comments)    S/P gastric bypass-told not to take. GI bleeds with NSAIDS    Inpatient Medications    . apixaban  5 mg Oral BID  . atorvastatin  80 mg Oral QHS  . buPROPion  150 mg Oral Daily  . furosemide  40 mg Intravenous BID  . gabapentin   300 mg Oral BID  . insulin aspart  0-9 Units Subcutaneous TID WC  . insulin glargine  16 Units Subcutaneous QHS  . latanoprost  1 drop Both Eyes QHS  . levothyroxine  112 mcg Oral QAC breakfast  . nicotine  21 mg Transdermal Daily  . sodium chloride flush  3 mL Intravenous Q12H    Family History    Family History  Problem Relation Age of Onset  . Breast cancer Mother   . Diabetes Mother   . Heart disease Father   . Heart attack Father   . Heart attack Sister   . Heart attack Brother   . Breast cancer Sister   . Breast cancer Maternal Aunt     x 2 aunts  . Pancreatic cancer Brother   . Prostate cancer Brother   . Colon polyps Brother   . Bladder Cancer Cousin   . Clotting disorder Sister   . Diabetes Sister     x 3  . Diabetes Maternal Grandmother     Social History    Social History   Social History  . Marital status: Married    Spouse name: [name]  . Number of children: 0  . Years of education: N/A   Occupational History  . Retired     office work   Social History Main Topics  . Smoking status: Light Tobacco Smoker    Packs/day: 1.00    Years: 30.00    Types: Cigarettes    Last attempt to quit: 05/03/2013  . Smokeless tobacco: Never Used     Comment: 06/25/2013 "smoked off and on since I was 22; stopped 10/2007 til 08/30/2012 then started again cause of stress"  . Alcohol use 0.0 oz/week     Comment: 06/25/2013 "glass of wine 1-2 times/yr"  . Drug use: No  . Sexual activity: Yes   Other Topics Concern  . Not on file   Social History Narrative   Married to husband, [name]   No children   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   Recently moved back here from [place]:  No chills, fever, night sweats or weight changes.  Cardiovascular:  No chest pain, dyspnea on exertion, + edema, orthopnea, palpitations, paroxysmal nocturnal dyspnea. Dermatological: No rash, lesions/masses Respiratory: No  cough, dyspnea Urologic: No hematuria, dysuria Abdominal:   No nausea, vomiting, diarrhea, bright red blood per rectum, melena, or hematemesis Neurologic:  No visual changes, wkns, changes in mental status. All  other systems reviewed and are otherwise negative except as noted above.  Physical Exam    Blood pressure (!) 124/50, pulse 68, temperature 98.4 F (36.9 C), temperature source Oral, resp. rate 18, height 5\' 7"  (1.702 m), weight 248 lb 9.6 oz (112.8 kg), SpO2 96 %.  General: Obese female, Pleasant, NAD Psych: Normal affect. Neuro: Alert and oriented X 3. Moves all extremities spontaneously. HEENT: Normal  Neck: Supple without bruits or JVD. Lungs:  Resp regular and unlabored, CTA. Heart: RRR no s3, s4, or murmurs. Abdomen: Soft, non-tender, non-distended, BS + x 4.  Extremities: No clubbing, cyanosis or edema. DP/PT/Radials 2+ and equal bilaterally.  Labs    Troponin [facility] of [facility] Test)  Recent Labs  08/01/16 1937  TROPIPOC 0.00    Recent Labs  08/02/16 0102 08/02/16 0719 08/02/16 1302  TROPONINI <0.03 <0.03 <0.03   Lab Results  Component Value Date   WBC 8.1 08/04/2016   HGB 11.8 (L) 08/04/2016   HCT 35.6 (L) 08/04/2016   MCV 86.6 08/04/2016   PLT 317 08/04/2016    Recent Labs Lab 08/04/16 1212  NA 130*  K 3.3*  CL 86*  CO2 34*  BUN 27*  CREATININE 1.27*  CALCIUM 9.1  PROT 6.5  BILITOT 0.7  ALKPHOS 75  ALT 13*  AST 15  GLUCOSE 171*   Lab Results  Component Value Date   CHOL 178 08/02/2016   HDL 42 08/02/2016   LDLCALC 90 08/02/2016   TRIG 229 (H) 08/02/2016   Lab Results  Component Value Date   DDIMER 0.30 12/31/2014     Radiology Studies    Dg Chest 2 View  Result Date: 08/01/2016 CLINICAL DATA:  Pt c/o "flopping feeling in my chest," bilateral leg swelling with pain in both legs. Took a nitro around 4pm that helped with chest discomfort. Pt seen in November and diagnosed with CHF. Weight loss and nausea. History of diabetes,  hypertension and smoking. EXAM: CHEST  2 VIEW COMPARISON:  07/07/2016 and 01/27/2015. FINDINGS: Left subclavian pacemaker leads are unchanged within the right atrium and right ventricle. The heart size and mediastinal contours are stable. There is aortic atherosclerosis. There is improved aeration of the lungs which are clear. No pleural effusion or pneumothorax. The bones appear unchanged. Surgical clips are present in the epigastric region. IMPRESSION: No active cardiopulmonary process. Electronically Signed   By: Richardean Sale M.D.   On: 08/01/2016 21:02   Dg Chest 2 View  Result Date: 07/07/2016 CLINICAL DATA:  Bilateral lower leg and feet swelling with redness in the left great toe for a few days. History of congestive heart failure, hypertension, atrial fibrillation. EXAM: CHEST  2 VIEW COMPARISON:  01/27/2015 FINDINGS: Cardiac pacemaker. Surgical clips in the left upper quadrant. Heart size and pulmonary vascularity are normal. No focal airspace disease or consolidation in the lungs. No blunting of costophrenic angles. No pneumothorax. Mediastinal contours appear intact. Calcified and tortuous aorta. IMPRESSION: No active cardiopulmonary disease. Electronically Signed   By: Lucienne Capers M.D.   On: 07/07/2016 23:37    EKG & Cardiac Imaging    EKG: Atrial sensed, V paced   Echocardiogram: 08/02/16 Study Conclusions  - Left ventricle: The cavity size was normal. Wall thickness was   increased in a pattern of mild LVH. Systolic function was normal.   The estimated ejection fraction was in the range of 60% to 65%.   Wall motion was normal; there were no regional wall motion   abnormalities. - Mitral  valve: Mildly to moderately calcified annulus. - Left atrium: The atrium was mildly dilated. - Right atrium: The atrium was mildly dilated. - Pulmonary arteries: Systolic pressure was mildly increased. PA   peak pressure: 36 mm Hg (S).   Assessment & Plan    1. Acute on chronic  diastolic CHF: Likely related to her diet, she does have some lower extremity edema, but says that her breathing has improved since admission with IV Lasix.   Would continue IV diuresis for today, would send home on 20mg  torsemide daily.   Would continue her losartan 25mg .  2. History of non obstructive CAD: Stable chest pain free.   3. Tachy/brady syndrome with PPM   4. History of PAF: This patients CHA2DS2-VASc Score and unadjusted Ischemic Stroke Rate (% per year) is equal to 7.2 % stroke rate/year from a score of 5 Above score calculated as 1 point each if present [CHF, HTN, DM, Vascular=MI/PAD/Aortic Plaque, Age if 65-74, or Female], 2 points each if present [Age > 75, or Stroke/TIA/TE]  Continue Eliquis.   Signed, Arbutus Leas, NP 08/04/2016, 12:56 PM Pager: [phone]   I have seen and examined the patient along with Arbutus Leas, NP.  I have reviewed the chart, notes and new data.  I agree with NP's note.  Key new complaints: feels much better, swelling almost gone Key examination changes: some residual pedal edema, lungs clear, N JVP Key new findings / data: BNP low - unreliable w obesity. Slight increase in creat since admission, now stationary. PM check shows normal function, no recent arrhythmia. Longstanding 100% V paced. Echo with normal LVEF. She has not had AFib.  PLAN: Probably 4-6 lb above ideal fluid weight. Switch to PO diuretics in AM and possibly ready for DC in PM. Had a long discussion re: daily weight monitoring, sodium dietary restriction (including hidden sources of Na), signs and sxs of CHF, need to intervene early when there is evidence of fluid build up. Call MD if weight is up by 3 lb/24h, 5 lb/week or consistently >4-5 lb above dry weight.  Sanda Klein, MD, [place] [phone] 08/04/2016, 2:10 PM

## 2016-08-05 ENCOUNTER — Other Ambulatory Visit: Payer: Self-pay

## 2016-08-05 DIAGNOSIS — Z6838 Body mass index (BMI) 38.0-38.9, adult: Secondary | ICD-10-CM

## 2016-08-05 DIAGNOSIS — I480 Paroxysmal atrial fibrillation: Secondary | ICD-10-CM

## 2016-08-05 DIAGNOSIS — I10 Essential (primary) hypertension: Secondary | ICD-10-CM

## 2016-08-05 DIAGNOSIS — Z950 Presence of cardiac pacemaker: Secondary | ICD-10-CM

## 2016-08-05 DIAGNOSIS — I872 Venous insufficiency (chronic) (peripheral): Secondary | ICD-10-CM

## 2016-08-05 DIAGNOSIS — E6609 Other obesity due to excess calories: Secondary | ICD-10-CM

## 2016-08-05 DIAGNOSIS — I441 Atrioventricular block, second degree: Secondary | ICD-10-CM

## 2016-08-05 LAB — GLUCOSE, CAPILLARY
GLUCOSE-CAPILLARY: 219 mg/dL — AB (ref 65–99)
Glucose-Capillary: 205 mg/dL — ABNORMAL HIGH (ref 65–99)

## 2016-08-05 MED ORDER — POTASSIUM CHLORIDE CRYS ER 20 MEQ PO TBCR
60.0000 meq | EXTENDED_RELEASE_TABLET | Freq: Once | ORAL | Status: AC
  Administered 2016-08-05: 60 meq via ORAL
  Filled 2016-08-05: qty 3

## 2016-08-05 MED ORDER — POTASSIUM CHLORIDE CRYS ER 20 MEQ PO TBCR: 40.0000 meq | EXTENDED_RELEASE_TABLET | Freq: Once | ORAL | 0 refills | Status: DC

## 2016-08-05 MED ORDER — FUROSEMIDE 80 MG PO TABS: 80.0000 mg | ORAL_TABLET | Freq: Every day | ORAL | 1 refills | Status: DC

## 2016-08-05 NOTE — Consult Note (Signed)
      [facility] CM Primary Care Navigator  08/05/2016  Mary [name] [birth year] [id number]   Patient seen at the bedside to identify discharge needs. Patient shared that increased leg swelling, shortness of breath, blurred vision with headaches and heavy pressure to chest had led to this admission. Patient endorses Dr. Pricilla Holm with [facility] as her primary care provider. Transportation to her doctor's appointment is provided by husband Doren Custard).  Patient states using [employer] ([place]) to obtain medications without difficulty at this time (previous issue about Eliquis had been resolved per patient).  She reports doing her own medication management at home using "pill box" system. Husband will be the primary caregiver at home. She has a brother [name]) who can be of help as needed per patient.  Patient had expressed understanding to call primary care provider's office for a post discharge follow-up appointment within a week or sooner if needed. Patient letter provided as a reminder. She reports having a follow-up appointment with her cardiology in a few days as well. Discharge plan is home per patient.  Patient reports/ admits having lack of knowledge in managing HF and DM. She verbally agreed for referral to [facility] care management  for needs with disease management and transition of care at this time. Will refer patient to [facility] to follow-up on needs as appropriate.  For questions, please contact:  Dannielle Huh, BSN, RN- [facility] Primary Care Navigator  Telephone: [phone] [place]

## 2016-08-05 NOTE — Discharge Instructions (Signed)
Follow-up recommendations Follow-up with PCP in 3-5 days , including all  additional recommended appointments as below Follow-up CBC, CMP in 3-5 days Discontinued losartan HCTZ secondary to low blood pressure/hyponatremia Call MD if weight is up by 3 lb/24h, 5 lb/week or consistently >4-5 lb above dry weight.

## 2016-08-05 NOTE — Progress Notes (Signed)
Patient Name: Mary [name] Date of Encounter: 08/05/2016  Primary Cardiologist: [name] [facility] [place] pacemaker)  Hospital Problem List     Principal Problem:   Acute on chronic diastolic CHF (congestive heart failure) ([place]) Active Problems:   Obesity   CAD (coronary artery disease)   Diabetes mellitus with complication (HCC)   Hypothyroidism   Hypertension   Hyperlipidemia   PAF (paroxysmal atrial fibrillation) (HCC)   Cardiac pacemaker in situ   Chronic venous insufficiency   Depression   Chest pressure   DVT (deep vein thrombosis) in pregnancy ([place])   Tobacco abuse     Subjective   Feels better. Mild pedal edema persists. Weight mostly unchanged since yesterday.  Inpatient Medications    Scheduled Meds: . apixaban  5 mg Oral BID  . atorvastatin  80 mg Oral QHS  . buPROPion  150 mg Oral Daily  . furosemide  80 mg Oral Daily  . gabapentin  300 mg Oral BID  . insulin aspart  0-9 Units Subcutaneous TID WC  . insulin glargine  16 Units Subcutaneous QHS  . latanoprost  1 drop Both Eyes QHS  . levothyroxine  112 mcg Oral QAC breakfast  . nicotine  21 mg Transdermal Daily  . sodium chloride flush  3 mL Intravenous Q12H   Continuous Infusions:  PRN Meds: sodium chloride, acetaminophen, albuterol, ALPRAZolam, HYDROcodone-acetaminophen, hydrOXYzine, nitroGLYCERIN, sodium chloride flush, zolpidem   Vital Signs    Vitals:   08/04/16 0441 08/04/16 1300 08/04/16 2123 08/05/16 0508  BP: (!) 124/50 118/83 111/67 92/62  Pulse: 68 69 70   Resp: 18 18 18 20   Temp: 98.4 F (36.9 C) 98.5 F (36.9 C) 97.9 F (36.6 C) 97.6 F (36.4 C)  TempSrc: Oral Oral Oral Oral  SpO2: 96% 95% 95%   Weight:    248 lb 4.8 oz (112.6 kg)  Height:        Intake/Output Summary (Last 24 hours) at 08/05/16 1059 Last data filed at 08/05/16 0745  Gross per 24 hour  Intake             1080 ml  Output             1250 ml  Net             -170 ml   Filed Weights   08/03/16 0543 08/04/16 0437  08/05/16 0508  Weight: 250 lb 1.6 oz (113.4 kg) 248 lb 9.6 oz (112.8 kg) 248 lb 4.8 oz (112.6 kg)    Physical Exam   GEN: Well nourished, well developed, in no acute distress.  HEENT: Grossly normal.  Neck: Supple, no JVD, carotid bruits, or masses. Cardiac: RRR, no murmurs, rubs, or gallops. No clubbing, cyanosis, 1-2+ symmetrical pedal edema.  Radials/DP/PT 2+ and equal bilaterally.  Respiratory:  Respirations regular and unlabored, clear to auscultation bilaterally. GI: Soft, nontender, nondistended, BS + x 4. MS: no deformity or atrophy. Skin: warm and dry, no rash. Neuro:  Strength and sensation are intact. Psych: AAOx3.  Normal affect.  Labs    CBC  Recent Labs  08/04/16 1212  WBC 8.1  HGB 11.8*  HCT 35.6*  MCV 86.6  PLT [id number]   Basic Metabolic Panel  Recent Labs  08/03/16 0201 08/04/16 1212  NA 129* 130*  K 3.4* 3.3*  CL 88* 86*  CO2 34* 34*  GLUCOSE 160* 171*  BUN 23* 27*  CREATININE 1.28* 1.27*  CALCIUM 8.9 9.1   Liver Function Tests  Recent Labs  08/03/16 0201 08/04/16 1212  AST 15 15  ALT 13* 13*  ALKPHOS 65 75  BILITOT 0.5 0.7  PROT 6.5 6.5  ALBUMIN 3.2* 3.4*   No results for input(s): LIPASE, AMYLASE in the last 72 hours. Cardiac Enzymes  Recent Labs  08/02/16 1302  TROPONINI <0.03   Thyroid Function Tests  Recent Labs  08/02/16 1127  TSH 4.896*    Telemetry    A sensed V paced - Personally Reviewed  Cardiac Studies   Echo 12/04 - Left ventricle: The cavity size was normal. Wall thickness was   increased in a pattern of mild LVH. Systolic function was normal.   The estimated ejection fraction was in the range of 60% to 65%.   Wall motion was normal; there were no regional wall motion   abnormalities. - Mitral valve: Mildly to moderately calcified annulus. - Left atrium: The atrium was mildly dilated. - Right atrium: The atrium was mildly dilated. - Pulmonary arteries: Systolic pressure was mildly increased. PA   peak  pressure: 36 mm Hg (S).   Patient Profile     Mary [name] is a 75 year old female with a past medical history of chronic diastolic CHF, high grade AV block, tachybradycardia syndrome s/p PPM ([facility] [place]), PAF, DVT, HTN, HLD, DM, and OSA, non obstructive CAD by cath in June 2016, presenting with volume overload (decompensation appears related to excessive sodium intake)  Assessment & Plan  1. Acute on chronic diastolic HF: Improved. Still probably 4-6 lb above ideal fluid weight, but with slow additional diuresis, ready for DC. Had a long discussion re: daily weight monitoring, sodium dietary restriction (including hidden sources of Na), signs and sxs of CHF, need to intervene early when there is evidence of fluid build up. Call MD if weight is up by 3 lb/24h, 5 lb/week or consistently >4-5 lb above dry weight. Will arrange f/u appt in 1-2 weeks. 2. Second degree AV block: with longstanding 100% V pacing 3. PPM: with normal function 4. Paroxysmal AFib: none recently by PM check, on anticoagulation 5. Minor CAD by fairly recent cath, normal LVEF and no wall motion abn on echo this admission. 6. [id number] control  Signed, Sanda Klein, MD  08/05/2016, 10:59 AM

## 2016-08-05 NOTE — Progress Notes (Signed)
PT Cancellation Note  Patient Details Name: Mary [name] MRN: [id number] DOB: 12-Nov-1940   Cancelled Treatment:     Pt denies need for stair training and reports she has been ambulating halls with husband this am.  Pt refused treatment reports she is awaiting d/c home.     [name] Eli Hose 08/05/2016, 12:51 PM  [place]or [place], PTA pager [phone]

## 2016-08-05 NOTE — Discharge Summary (Addendum)
Physician Discharge Summary  Mary [name] MRN: [id number] DOB/AGE: 01/23/1941 75 y.o.  PCP: Hoyt Koch, MD   Admit date: 08/01/2016 Discharge date: 08/05/2016  Discharge Diagnoses:    Principal Problem:   Acute on chronic diastolic CHF (congestive heart failure) (HCC) Active Problems:   Obesity   CAD (coronary artery disease)   Diabetes mellitus with complication (HCC)   Hypothyroidism   Hypertension   Hyperlipidemia   PAF (paroxysmal atrial fibrillation) (HCC)   Cardiac pacemaker in situ   Chronic venous insufficiency   Depression   Chest pressure   DVT (deep vein thrombosis) in pregnancy ([place])   Tobacco abuse    Follow-up recommendations Follow-up with PCP in 3-5 days , including all  additional recommended appointments as below Follow-up CBC, CMP in 3-5 days Discontinued losartan HCTZ secondary to low blood pressure/hyponatremia Call MD if weight is up by 3 lb/24h, 5 lb/week or consistently >4-5 lb above dry weight.     Current Discharge Medication List    START taking these medications   Details  nicotine (NICODERM CQ - DOSED IN MG/24 HOURS) 21 mg/24hr patch Place 1 patch (21 mg total) onto the skin daily. Qty: 28 patch, Refills: 0    !! potassium chloride SA (K-DUR,KLOR-CON) 20 MEQ tablet Take 1 tablet (20 mEq total) by mouth daily. Qty: 30 tablet, Refills: 0    !! potassium chloride SA (K-DUR,KLOR-CON) 20 MEQ tablet Take 2 tablets (40 mEq total) by mouth once. Qty: 60 tablet, Refills: 0     !! - Potential duplicate medications found. Please discuss with provider.    CONTINUE these medications which have CHANGED   Details  furosemide (LASIX) 80 MG tablet Take 1 tablet (80 mg total) by mouth daily. Qty: 30 tablet, Refills: 1      CONTINUE these medications which have NOT CHANGED   Details  atorvastatin (LIPITOR) 80 MG tablet take 1 tablet by mouth once daily Qty: 30 tablet, Refills: 6    bimatoprost (LUMIGAN) 0.01 % SOLN Place 1 drop into  the right eye at bedtime.    Associated Diagnoses: Anemia, unspecified; Malignant neoplasm of colon, unspecified site    buPROPion (WELLBUTRIN XL) 150 MG 24 hr tablet Take 1 tablet (150 mg total) by mouth daily. Qty: 30 tablet, Refills: 6    Capsaicin-Menthol (SALONPAS GEL EX) Apply 1 application topically 3 (three) times daily as needed (pain).    ELIQUIS 5 MG TABS tablet take 1 tablet by mouth twice a day Qty: 180 tablet, Refills: 3    gabapentin (NEURONTIN) 300 MG capsule Take 300 mg by mouth 2 (two) times daily.    Associated Diagnoses: Anemia, unspecified; Malignant neoplasm of colon, unspecified site    HYDROcodone-acetaminophen (NORCO/VICODIN) 5-325 MG tablet Take 1 tablet by mouth every 6 (six) hours as needed for moderate pain. Qty: 45 tablet, Refills: 0    insulin glargine (LANTUS) 100 unit/mL SOPN Inject 24 Units into the skin at bedtime.    levothyroxine (SYNTHROID, LEVOTHROID) 112 MCG tablet Take 112 mcg by mouth daily before breakfast.    metFORMIN (GLUCOPHAGE) 1000 MG tablet Take 1 tablet (1,000 mg total) by mouth 2 (two) times daily with a meal. Qty: 180 tablet, Refills: 3   Associated Diagnoses: Anemia, unspecified; Malignant neoplasm of colon, unspecified part of colon (HCC)    nitroGLYCERIN (NITROSTAT) 0.4 MG SL tablet Place 1 tablet (0.4 mg total) under the tongue every 5 (five) minutes as needed for chest pain. Qty: 90 tablet, Refills: 3  STOP taking these medications     losartan-hydrochlorothiazide (HYZAAR) 100-25 MG tablet          Discharge Condition:Stable   Discharge Instructions Get Medicines reviewed and adjusted: Please take all your medications with you for your next visit with your Primary MD  Please request your Primary MD to go over all hospital tests and procedure/radiological results at the follow up, please ask your Primary MD to get all Hospital records sent to his/her office.  If you experience worsening of your admission  symptoms, develop shortness of breath, life threatening emergency, suicidal or homicidal thoughts you must seek medical attention immediately by calling 911 or calling your MD immediately if symptoms less severe.  You must read complete instructions/literature along with all the possible adverse reactions/side effects for all the Medicines you take and that have been prescribed to you. Take any new Medicines after you have completely understood and accpet all the possible adverse reactions/side effects.   Do not drive when taking Pain medications.   Do not take more than prescribed Pain, Sleep and Anxiety Medications  Special Instructions: If you have smoked or chewed Tobacco in the last 2 yrs please stop smoking, stop any regular Alcohol and or any Recreational drug use.  Wear Seat belts while driving.  Please note  You were cared for by a hospitalist during your hospital stay. Once you are discharged, your primary care physician will handle any further medical issues. Please note that NO REFILLS for any discharge medications will be authorized once you are discharged, as it is imperative that you return to your primary care physician (or establish a relationship with a primary care physician if you do not have one) for your aftercare needs so that they can reassess your need for medications and monitor your lab values.     Allergies  Allergen Reactions  . Other Other (See Comments)    NO Blind Scopes with Naso Gastric tube.  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 Adhesive [Tape]     Tape - burns - pls use paper tape or elastic bandage  . Aspirin Other (See Comments)    S/P gastric bypass surgery, states her MD told her to not take Aspirin.  . Latex     Tape= burns  . Nsaids Other (See Comments)    S/P gastric bypass-told not to take. GI bleeds with NSAIDS      Disposition: 01-Home or Self Care   Consults:  Cardiology    Significant Diagnostic Studies:  Dg Chest 2  View  Result Date: 08/01/2016 CLINICAL DATA:  Pt c/o "flopping feeling in my chest," bilateral leg swelling with pain in both legs. Took a nitro around 4pm that helped with chest discomfort. Pt seen in November and diagnosed with CHF. Weight loss and nausea. History of diabetes, hypertension and smoking. EXAM: CHEST  2 VIEW COMPARISON:  07/07/2016 and 01/27/2015. FINDINGS: Left subclavian pacemaker leads are unchanged within the right atrium and right ventricle. The heart size and mediastinal contours are stable. There is aortic atherosclerosis. There is improved aeration of the lungs which are clear. No pleural effusion or pneumothorax. The bones appear unchanged. Surgical clips are present in the epigastric region. IMPRESSION: No active cardiopulmonary process. Electronically Signed   By: Richardean Sale M.D.   On: 08/01/2016 21:02   Dg Chest 2 View  Result Date: 07/07/2016 CLINICAL DATA:  Bilateral lower leg and feet swelling with redness in the left great toe for a few days. History of  congestive heart failure, hypertension, atrial fibrillation. EXAM: CHEST  2 VIEW COMPARISON:  01/27/2015 FINDINGS: Cardiac pacemaker. Surgical clips in the left upper quadrant. Heart size and pulmonary vascularity are normal. No focal airspace disease or consolidation in the lungs. No blunting of costophrenic angles. No pneumothorax. Mediastinal contours appear intact. Calcified and tortuous aorta. IMPRESSION: No active cardiopulmonary disease. Electronically Signed   By: Lucienne Capers M.D.   On: 07/07/2016 23:37     2-D echo LV EF: 60% -   65%  ------------------------------------------------------------------- Indications:      428.0 CHF.  ------------------------------------------------------------------- History:   PMH:   Pressure.  Atrial fibrillation.  Coronary artery disease.  Risk factors:  Hypertension. Diabetes mellitus.  Obese. Dyslipidemia.  ------------------------------------------------------------------- Study Conclusions  - Left ventricle: The cavity size was normal. Wall thickness was   increased in a pattern of mild LVH. Systolic function was normal.   The estimated ejection fraction was in the range of 60% to 65%.   Wall motion was normal; there were no regional wall motion   abnormalities. - Mitral valve: Mildly to moderately calcified annulus. - Left atrium: The atrium was mildly dilated. - Right atrium: The atrium was mildly dilated. - Pulmonary arteries: Systolic pressure was mildly increased. PA   peak pressure: 36 mm Hg     Filed Weights   08/03/16 0543 08/04/16 0437 08/05/16 0508  Weight: 113.4 kg (250 lb 1.6 oz) 112.8 kg (248 lb 9.6 oz) 112.6 kg (248 lb 4.8 oz)     Microbiology: Recent Results (from the past 240 hour(s))  MRSA PCR Screening     Status: None   Collection Time: 08/02/16  1:43 AM  Result Value Ref Range Status   MRSA by PCR NEGATIVE NEGATIVE Final    Comment:        The GeneXpert MRSA Assay (FDA approved for NASAL specimens only), is one component of a comprehensive MRSA colonization surveillance program. It is not intended to diagnose MRSA infection nor to guide or monitor treatment for MRSA infections.        Blood Culture No results found for: SDES, [place], CULT, REPTSTATUS    Labs: Results for orders placed or performed during the hospital encounter of 08/01/16 (from the past 48 hour(s))  Glucose, capillary     Status: Abnormal   Collection Time: 08/03/16 11:47 AM  Result Value Ref Range   Glucose-Capillary 185 (H) 65 - 99 mg/dL   Comment 1 Notify RN    Comment 2 Document in Chart   Glucose, capillary     Status: Abnormal   Collection Time: 08/03/16  4:59 PM  Result Value Ref Range   Glucose-Capillary 163 (H) 65 - 99 mg/dL   Comment 1 Notify RN    Comment 2 Document in Chart   Glucose, capillary     Status: Abnormal    Collection Time: 08/03/16  9:37 PM  Result Value Ref Range   Glucose-Capillary 200 (H) 65 - 99 mg/dL  Glucose, capillary     Status: Abnormal   Collection Time: 08/04/16  6:29 AM  Result Value Ref Range   Glucose-Capillary 155 (H) 65 - 99 mg/dL  Glucose, capillary     Status: Abnormal   Collection Time: 08/04/16 11:09 AM  Result Value Ref Range   Glucose-Capillary 215 (H) 65 - 99 mg/dL   Comment 1 Notify RN    Comment 2 Document in Chart   Comprehensive metabolic panel     Status: Abnormal   Collection Time: 08/04/16 12:12  PM  Result Value Ref Range   Sodium 130 (L) 135 - 145 mmol/L   Potassium 3.3 (L) 3.5 - 5.1 mmol/L   Chloride 86 (L) 101 - 111 mmol/L   CO2 34 (H) 22 - 32 mmol/L   Glucose, Bld 171 (H) 65 - 99 mg/dL   BUN 27 (H) 6 - 20 mg/dL   Creatinine, Ser 1.27 (H) 0.44 - 1.00 mg/dL   Calcium 9.1 8.9 - 10.3 mg/dL   Total Protein 6.5 6.5 - 8.1 g/dL   Albumin 3.4 (L) 3.5 - 5.0 g/dL   AST 15 15 - 41 U/L   ALT 13 (L) 14 - 54 U/L   Alkaline Phosphatase 75 38 - 126 U/L   Total Bilirubin 0.7 0.3 - 1.2 mg/dL   GFR calc non Af Amer 41 (L) >60 mL/min   GFR calc Af Amer 47 (L) >60 mL/min    Comment: (NOTE) The eGFR has been calculated using the CKD EPI equation. This calculation has not been validated in all clinical situations. eGFR's persistently <60 mL/min signify possible Chronic Kidney Disease.    Anion gap 10 5 - 15  CBC     Status: Abnormal   Collection Time: 08/04/16 12:12 PM  Result Value Ref Range   WBC 8.1 4.0 - 10.5 K/uL   RBC 4.11 3.87 - 5.11 MIL/uL   Hemoglobin 11.8 (L) 12.0 - 15.0 g/dL   HCT 35.6 (L) 36.0 - 46.0 %   MCV 86.6 78.0 - 100.0 fL   MCH 28.7 26.0 - 34.0 pg   MCHC 33.1 30.0 - 36.0 g/dL   RDW 13.0 11.5 - 15.5 %   Platelets 317 150 - 400 K/uL  Glucose, capillary     Status: Abnormal   Collection Time: 08/04/16  4:34 PM  Result Value Ref Range   Glucose-Capillary 190 (H) 65 - 99 mg/dL   Comment 1 Notify RN    Comment 2 Document in Chart    Glucose, capillary     Status: Abnormal   Collection Time: 08/04/16  9:20 PM  Result Value Ref Range   Glucose-Capillary 184 (H) 65 - 99 mg/dL  Glucose, capillary     Status: Abnormal   Collection Time: 08/05/16  6:15 AM  Result Value Ref Range   Glucose-Capillary 205 (H) 65 - 99 mg/dL     Lipid Panel     Component Value Date/Time   CHOL 178 08/02/2016 0102   TRIG 229 (H) 08/02/2016 0102   HDL 42 08/02/2016 0102   CHOLHDL 4.2 08/02/2016 0102   VLDL 46 (H) 08/02/2016 0102   LDLCALC 90 08/02/2016 0102   LDLDIRECT 79.0 03/23/2016 1529     Lab Results  Component Value Date   HGBA1C 9.1 (H) 08/02/2016   HGBA1C 8.8 (H) 03/23/2016   HGBA1C 9.0 (H) 01/27/2015        HPI :   75 y.o.femalewith medical history significant of dCHF, carotidartery stenosis, nonobstructive CAD status postcardiac 2016, PAF and DVT on Eliquis, hypertension, hyperlipidemia, diabetes mellitus, hypothyroidism, depression, OSA not on CPAP, colon cancer, s/p of pacemaker placement, chronic venous insufficiency, obesity, tobacco abuse, who presents with shortness of breath, orthopnea, chest pressure, worsening leg edema. Most recent EF 55-60% noncardiac In 2016  HOSPITAL COURSE:   Acute on chronic diastolic CHF (congestive heart failure) (HCC):Patient shortness of breath, worsening leg edema, positive JVD, rales on auscultation, consistent with CHF exacerbation. Her BNP is 66.3, which is likely falsely low due to obesity. 2-D echo on 01/01/13  showed EF 55-60%. This was unchanged during this admission  telemetry showed paced rhythm, possible PVCs Patient diuresed with IV Lasix, switch to Lasix 80  mg a day -trop x 3-negative -2d echo-results as above Cardiology consulted  , they have adjusted her diuretics  Call MD if weight is up by 3 lb/24h, 5 lb/week or consistently >4-5 lb above dry weight.     CAD: has resolved. Likely due to demand ischemia secondary to CHF exacerbation. -Follow-up troponin  3-negative -prn NTG -Risk factor stratification: A1c, LDL 90 Patient is followed by Dr. [name]   DVT: -on Eliquis  Atrial Fibrillation: CHA2DS2-VASc Scoreis 7, needs oral anticoagulation. Patient is on Eliquis at home. Heart rate is well controlled without AV bloker. -tele monitoring shows possible PAF  -continue Eliquis  Hypothyroidism: Last TSH was 0.314 on 01/27/15, repeat TSH 4.9 -Continue home Synthroid   HTN: Discontinue Hyzarr given low blood pressure, low sodium    HLD: LDL 90 -Continue home medications: Lipitor  DM-II:Last A1c 8.8 on 03/23/16, poorly controled. Patient is takingmetformin and Lantusat home  Resume all regimen  Tobacco abuse: -Did counseling about importance of quitting smoking -Nicotine patch   Discharge Exam:   Blood pressure 92/62, pulse 70, temperature 97.6 F (36.4 C), temperature source Oral, resp. rate 20, height '5\' 7"'  (1.702 m), weight 112.6 kg (248 lb 4.8 oz), SpO2 95 %.  General: Obese female, Pleasant, NAD Psych: Normal affect. Neuro: Alert and oriented X 3. Moves all extremities spontaneously. HEENT: Normal           Neck: Supple without bruits or JVD. Lungs:  Resp regular and unlabored, CTA. Heart: RRR no s3, s4, or murmurs. Abdomen: Soft, non-tender, non-distended, BS + x 4.  Extremities: No clubbing, cyanosis or edema. DP/PT/Radials 2+ and equal bilaterally.    Follow-up Information    Hoyt Koch, MD. Schedule an appointment as soon as possible for a visit in 3 day(s).   Specialty:  Internal Medicine Why:  hospital follow-up, repeat CBC, BMP in  3-5 days Contact information: [place] [address] [place], [place]. Schedule an appointment as soon as possible for a visit in 1 week(s).   Specialty:  Cardiology Contact information: [address] [place] [address] [phone]           Signed: Reyne Dumas 08/05/2016, 8:40 AM         Time spent >45 mins

## 2016-08-05 NOTE — Care Management Note (Signed)
Case Management Note Previous CM note initiated by [name], RN 08/02/2016, 3:53 PM   Patient Details  Name: Mary [name] MRN: [id number] Date of Birth: 1941-07-29  Subjective/Objective:                    Action/Plan:  PTA independent from home with husband.  Pt states she uses walker when she is outside of the home and cane inside the home.  Pt states she has to get batteries for her scale and acknowledged the importance of daily weight for fluid concerns.  Pt stated that she "tries" to adhere to low sodium diet.  CM ordered PT/OT eval to be completed when pt is medically stable.   CM will continue to follow for discharge planning.   Expected Discharge Date:       08/05/16           Expected Discharge Plan:  Home/Self Care  In-House Referral:     Discharge planning Services  CM Consult  Post Acute Care Choice:  Durable Medical Equipment Choice offered to:  Patient  DME Arranged:  3-N-1, Walker rolling DME Agency:  [place]:    [place]:     Status of Service:  Completed, signed off  If discussed at [place] of Stay Meetings, dates discussed:    Discharge Disposition: home/self care   Additional Comments:  08/05/16- 1245- Tristian Bouska RN, CM- pt for d/c home today- orders for DME placed- spoke with pt at bedside to confirm DME needs- RW and 3n1 requested- call made to [facility] with [facility] for DME delivery of 3n1 and RW- pt ready for d/c - equipment to be delivered to room prior to discharge.   Dahlia Client [place], RN 08/05/2016, 2:21 PM [phone]

## 2016-08-06 ENCOUNTER — Other Ambulatory Visit: Payer: Self-pay | Admitting: *Deleted

## 2016-08-06 ENCOUNTER — Telehealth: Payer: Self-pay | Admitting: *Deleted

## 2016-08-06 NOTE — Telephone Encounter (Signed)
Transition Care Management Follow-up Telephone Call   Date discharged? 08/05/16   How have you been since you were released from the hospital? Pt states she is feeling alright   Do you understand why you were in the hospital? YES   Do you understand the discharge instructions? YES   Where were you discharged to? Home   Items Reviewed:  Medications reviewed: YES  Allergies reviewed: YES  Dietary changes reviewed: YES  Referrals reviewed: No referral needed  Functional Questionnaire:   Activities of Daily Living (ADLs):   She states she are independent in the following: ambulation, bathing and hygiene, feeding, continence, grooming, toileting and dressing States she doesn't require assistance    Any transportation issues/concerns?: NO   Any patient concerns? NO   Confirmed importance and date/time of follow-up visits scheduled YES, appt 08/10/16  Provider Appointment booked with Dr. Sharlet Salina  Confirmed with patient if condition begins to worsen call PCP or go to the ER.  Patient was given the office number and encouraged to call back with question or concerns.  : YES

## 2016-08-06 NOTE — Patient Outreach (Signed)
[place] [facility]) Care Management  08/06/2016  Mary [name] 01-06-1941 [id number]   Initial outreach attempt unsuccessful. RN able to leave a HIPAA approved voice message requesting a call back. Will inquire further at that time concerning pt's recent discharge status.  Will reschedule for another transition of care call next week.  Raina Mina, RN Care Management Coordinator [place] Office [phone]

## 2016-08-09 ENCOUNTER — Other Ambulatory Visit: Payer: Self-pay | Admitting: *Deleted

## 2016-08-09 NOTE — Progress Notes (Signed)
Cardiology Office Note    Date:  08/11/2016   ID:  [name], DOB 1941/06/17, MRN [id number]  PCP:  Hoyt Koch, MD  Cardiologist:  Dr. [name] / Dr. Rayann Heman (EP)  CC: post hospital follow up  History of Present Illness:  [name] is a 75 y.o. female with a history of chronic diastolic CHF, tachy-brady syndrome s/p PPM ([employer]), PAF on Eliquis, DVT ( on Eliquis), HTN, HLD, DM, OSA, non obstructive by cath (01/2015) who presents to clinic for post hospital follow up.   She was recently admitted 0000000 for A/C disatolic CHF 2/2 excessive sodium intake. Discharged on lasix 80mg  daily. Long discussion about daily weights and sodium restriction. 2D echo this admission with EF 60-65%, no RWMAs, mild-mod MR, mild LAE/RAE, PA pressure 36.   She saw her PCP yesterday and BMET drawn. Labs looked good.   Today she presents to clinic for follow up. She continued to loose weight after discharge and weight stable last few days. Chronic LE edema and now closer to her baseline. No chest pain. Breathing much better. Chronically sleeps on the sofa for her back. Sometimes some PND. No dizziness or syncope. Very strict with her salt and fluid restrictions.     Past Medical History:  Diagnosis Date  . Arthritis   . CAD (coronary artery disease)    non obstructive  . Carotid stenosis    Carotid US 5/16:  Bilateral ICA 1-39%; L vertebral retrograde; L BP 126/49, R BP 140/57  . CHF (congestive heart failure) ([name])   . Chronic lower back pain   . Colon cancer ([name])    a. s/p chemo  . Colon polyps   . Diabetic peripheral neuropathy ([name])   . DVT (deep venous thrombosis) ([name]) 2013   "twice behind knee on left side" (06/25/2013)  . Glaucoma   . Hyperlipidemia   . Hypertension   . Hypothyroidism   . Iron deficiency anemia   . Multinodular goiter   . Sleep apnea    a. resolved post weight loss   . Type II diabetes mellitus ([name])     Past Surgical History:  Procedure  Laterality Date  . CARDIAC CATHETERIZATION  2006   Never had PCI, 3 caths total. Last one in [name]  . CARDIAC CATHETERIZATION N/A 02/04/2015   Procedure: Left Heart Cath and Coronary Angiography;  Surgeon: Belva Crome, MD;  Location: [name] [facility];  Service: Cardiovascular;  Laterality: N/A;  . CATARACT EXTRACTION, BILATERAL Bilateral 2013   "and put stent in my left eye for glaucoma" (06/25/2013)  . CHOLECYSTECTOMY  1980's  . COLECTOMY  10/2010   Tumor removal  . EYE MUSCLE SURGERY Bilateral ~ 1963   "muscles too long; eyes would go out and up; tied muscles to hold my eyes straight" (06/25/2013)  . LEAD REVISION  01-03-2014   atrial lead revision by Dr Rayann Heman  . LEAD REVISION N/A 01/03/2014   Procedure: LEAD REVISION;  Surgeon: Coralyn Mark, MD;  Location: [facility];  Service: Cardiovascular;  Laterality: N/A;  . PACEMAKER INSERTION  01/02/2014   STJ Assurity dual chamber pacemaker implnated by Dr Lovena Le for SSS  . PERMANENT PACEMAKER INSERTION N/A 01/02/2014   Procedure: PERMANENT PACEMAKER INSERTION;  Surgeon: Evans Lance, MD;  Location: [name];  Service: Cardiovascular;  Laterality: N/A;  . PORTACATH PLACEMENT Left 11/2010  . ROUX-EN-Y GASTRIC BYPASS  2009  . ULNAR TUNNEL RELEASE Left ~ 2008  .  UMBILICAL HERNIA REPAIR  1970's?    (06/25/2013)    Current Medications: Outpatient Medications Prior to Visit  Medication Sig Dispense Refill  . bimatoprost (LUMIGAN) 0.01 % SOLN Place 1 drop into the right eye at bedtime.     Marland Kitchen buPROPion (WELLBUTRIN XL) 150 MG 24 hr tablet Take 1 tablet (150 mg total) by mouth daily. 30 tablet 6  . ELIQUIS 5 MG TABS tablet take 1 tablet by mouth twice a day 180 tablet 3  . furosemide (LASIX) 80 MG tablet Take 1 tablet (80 mg total) by mouth daily. 30 tablet 1  . gabapentin (NEURONTIN) 300 MG capsule Take 300 mg by mouth 2 (two) times daily.     Marland Kitchen HYDROcodone-acetaminophen (NORCO/VICODIN) 5-325 MG tablet Take 1 tablet by mouth every 6 (six)  hours as needed for moderate pain. 45 tablet 0  . insulin glargine (LANTUS) 100 unit/mL SOPN Inject 24 Units into the skin at bedtime.    Marland Kitchen levothyroxine (SYNTHROID, LEVOTHROID) 112 MCG tablet Take 112 mcg by mouth daily before breakfast.    . metFORMIN (GLUCOPHAGE) 1000 MG tablet Take 1 tablet (1,000 mg total) by mouth 2 (two) times daily with a meal. 180 tablet 3  . nitroGLYCERIN (NITROSTAT) 0.4 MG SL tablet Place 1 tablet (0.4 mg total) under the tongue every 5 (five) minutes as needed for chest pain. 90 tablet 3  . potassium chloride SA (K-DUR,KLOR-CON) 20 MEQ tablet Take 2 tablets (40 mEq total) by mouth once. 60 tablet 0  . atorvastatin (LIPITOR) 80 MG tablet take 1 tablet by mouth once daily (Patient not taking: Reported on 08/11/2016) 30 tablet 6  . Capsaicin-Menthol (SALONPAS GEL EX) Apply 1 application topically 3 (three) times daily as needed (pain).    . potassium chloride SA (K-DUR,KLOR-CON) 20 MEQ tablet Take 1 tablet (20 mEq total) by mouth daily. (Patient not taking: Reported on 08/11/2016) 30 tablet 0   No facility-administered medications prior to visit.      Allergies:   Other; Adhesive [tape]; Aspirin; Latex; and Nsaids   Social History   Social History  . Marital status: Married    Spouse name: [name]  . Number of children: 0  . Years of education: N/A   Occupational History  . Retired     office work   Social History Main Topics  . Smoking status: Light Tobacco Smoker    Packs/day: 1.00    Years: 30.00    Types: Cigarettes    Last attempt to quit: 05/03/2013  . Smokeless tobacco: Never Used     Comment: 06/25/2013 "smoked off and on since I was 22; stopped 10/2007 til 08/30/2012 then started again cause of stress"  . Alcohol use 0.0 oz/week     Comment: 06/25/2013 "glass of wine 1-2 times/yr"  . Drug use: No  . Sexual activity: Yes   Other Topics Concern  . None   Social History Narrative   Married to husband, [name]   No children   Retired Information systems manager to [employer]   Recently moved back here from [name]      Family History:  The patient's family history includes Bladder Cancer in her cousin; Breast cancer in her maternal aunt, mother, and sister; Clotting disorder in her sister; Colon polyps in her brother; Diabetes in her maternal grandmother, mother, and sister; Heart attack in her brother, father, and sister; Heart disease in her father; Pancreatic cancer in her brother; Prostate cancer in her brother.  ROS:   Please see the history of present illness.    ROS All other systems reviewed and are negative.   PHYSICAL EXAM:   VS:  BP 122/66   Pulse 76   Ht 5\' 7"  (1.702 m)   Wt 249 lb 1.9 oz (113 kg)   BMI 39.02 kg/m    GEN: Well nourished, well developed, in no acute distress, obese.  HEENT: normal  Neck: no JVD, carotid bruits, or masses Cardiac: RRR; no murmurs, rubs, or gallops, 1+ LE edema  Respiratory:  clear to auscultation bilaterally, normal work of breathing GI: soft, nontender, nondistended, + BS MS: no deformity or atrophy  Skin: warm and dry, no rash Neuro:  Alert and Oriented x 3, Strength and sensation are intact Psych: euthymic mood, full affect  Wt Readings from Last 3 Encounters:  08/11/16 249 lb 1.9 oz (113 kg)  08/10/16 249 lb (112.9 kg)  08/05/16 248 lb 4.8 oz (112.6 kg)      Studies/Labs Reviewed:   EKG:  EKG is ordered today.  The ekg ordered today demonstrates V paced  Recent Labs: 08/01/2016: B Natriuretic Peptide 66.3 08/02/2016: TSH 4.896 08/04/2016: ALT 13; Hemoglobin 11.8; Platelets 317 08/10/2016: BUN 34; Creatinine, Ser 1.17; Potassium 4.6; Sodium 139   Lipid Panel    Component Value Date/Time   CHOL 178 08/02/2016 0102   TRIG 229 (H) 08/02/2016 0102   HDL 42 08/02/2016 0102   CHOLHDL 4.2 08/02/2016 0102   VLDL 46 (H) 08/02/2016 0102   LDLCALC 90 08/02/2016 0102   LDLDIRECT 79.0 03/23/2016 1529    Additional studies/ records that were reviewed  today include:  Echocardiogram: 08/02/16 Study Conclusions - Left ventricle: The cavity size was normal. Wall thickness was increased in a pattern of mild LVH. Systolic function was normal. The estimated ejection fraction was in the range of 60% to 65%. Wall motion was normal; there were no regional wall motion abnormalities. - Mitral valve: Mildly to moderately calcified annulus. - Left atrium: The atrium was mildly dilated. - Right atrium: The atrium was mildly dilated. - Pulmonary arteries: Systolic pressure was mildly increased. PA peak pressure: 36 mm Hg (S).   ASSESSMENT & PLAN:   Chronic diastolic CHF: weight 0000000 at discharge. Today 249lbs. She has some degree of chronic LE edema but closer to her baseline. Continue lasix 80mg  daily and Kdur 107mEq daily. BMET yesterday with normal K and creat 1.17. Continue this.   Tachy-brady s/p PPM: with longstanding 100% V pacing. Followed by Dr. Rayann Heman  Paroxysmal AFib: none recently by PM check, on anticoagulation with Eliquis for CHADSVASC of 5 (CHF, HTN, age, DM, Vasc dz)  Minor CAD by fairly recent cath, normal LVEF and no wall motion abn on echo on most recent admission.  HTN: excellent control   Medication Adjustments/Labs and Tests Ordered: Current medicines are reviewed at length with the patient today.  Concerns regarding medicines are outlined above.  Medication changes, Labs and Tests ordered today are listed in the Patient Instructions below. Patient Instructions  Medication Instructions:  Your physician recommends that you continue on your current medications as directed. Please refer to the Current Medication list given to you today.   Labwork: None ordered  Testing/Procedures: None ordered  Follow-Up: Your physician recommends that you schedule a follow-up appointment in: [place]  Any Other Special Instructions Will Be Listed Below (If Applicable).     If you need a refill on your  cardiac medications before your next  appointment, please call your pharmacy.      Signed, Angelena Form, PA-C  08/11/2016 12:20 PM    [facility] [address], [place], [place]  [address] Phone: [phone]; Fax: [phone]

## 2016-08-09 NOTE — Patient Outreach (Signed)
[facility]) Care Management  08/09/2016  Mary [name] [birth year] [id number]   RN attempted outreach call however unsuccessful. RN will reschedule one additional contact prior to sending outreach letter. RN able to leave a HIPAA approved voice message requesting a call back.  Raina Mina, RN Care Management Coordinator [place] Office [phone]

## 2016-08-10 ENCOUNTER — Ambulatory Visit (INDEPENDENT_AMBULATORY_CARE_PROVIDER_SITE_OTHER): Payer: Medicare Other | Admitting: Internal Medicine

## 2016-08-10 ENCOUNTER — Encounter: Payer: Self-pay | Admitting: Internal Medicine

## 2016-08-10 ENCOUNTER — Other Ambulatory Visit (INDEPENDENT_AMBULATORY_CARE_PROVIDER_SITE_OTHER): Payer: Medicare Other

## 2016-08-10 VITALS — BP 120/72 | HR 86 | Temp 98.5°F | Resp 14 | Ht 67.0 in | Wt 249.0 lb

## 2016-08-10 DIAGNOSIS — I10 Essential (primary) hypertension: Secondary | ICD-10-CM | POA: Diagnosis not present

## 2016-08-10 DIAGNOSIS — I5032 Chronic diastolic (congestive) heart failure: Secondary | ICD-10-CM

## 2016-08-10 DIAGNOSIS — D649 Anemia, unspecified: Secondary | ICD-10-CM | POA: Diagnosis not present

## 2016-08-10 DIAGNOSIS — Z23 Encounter for immunization: Secondary | ICD-10-CM

## 2016-08-10 DIAGNOSIS — E119 Type 2 diabetes mellitus without complications: Secondary | ICD-10-CM | POA: Diagnosis not present

## 2016-08-10 DIAGNOSIS — H401131 Primary open-angle glaucoma, bilateral, mild stage: Secondary | ICD-10-CM | POA: Diagnosis not present

## 2016-08-10 LAB — BASIC METABOLIC PANEL
BUN: 34 mg/dL — ABNORMAL HIGH (ref 6–23)
CALCIUM: 10 mg/dL (ref 8.4–10.5)
CO2: 32 meq/L (ref 19–32)
CREATININE: 1.17 mg/dL (ref 0.40–1.20)
Chloride: 96 mEq/L (ref 96–112)
GFR: 47.97 mL/min — ABNORMAL LOW (ref >60.00)
Glucose, Bld: 148 mg/dL — ABNORMAL HIGH (ref 70–99)
Potassium: 4.6 mEq/L (ref 3.5–5.1)
Sodium: 139 mEq/L (ref 135–145)

## 2016-08-10 NOTE — Patient Instructions (Signed)
We have given you the flu shot today and are checking the blood work for the kidneys.   Come back in about 3 months and call us sooner if you have weight gain of more than 3 pounds, swelling worse in your legs, or problems with breathing.

## 2016-08-10 NOTE — Assessment & Plan Note (Signed)
Checking BMP, continue lasix 80 mg daily and potassium 20 mEq daily. She is doing well and may have slight amount of fluid left. Reminded about the need to call with 3 pound weight gain, swelling worse, SOB.

## 2016-08-10 NOTE — Progress Notes (Signed)
   Subjective:    Patient ID: [facility] [name], female    DOB: Apr 29, 1941, 75 y.o.   MRN: [id number]  HPI The patient is a 75 YO female coming in for hospital follow up (in for diastolic heart failure exacerbation with significant swelling and weight gain, she took IV diuretics and had good results). She is still taking increased fluid pill dosing since leaving the hospital and is doing another 3-4 pounds. She does feel that she still has slight amount of fluid in her ankles which is more than usual. Diet has been good recently. No falls. Denies chest pains, SOB, abdominal pain. Denies constipation or diarrhea. No headaches.   PMH, [facility], social history reviewed and updated.  Review of Systems  Constitutional: Negative for activity change, appetite change, fatigue, fever and unexpected weight change.  HENT: Negative.   Eyes: Negative.   Respiratory: Negative for cough, chest tightness, shortness of breath and wheezing.   Cardiovascular: Positive for leg swelling. Negative for chest pain and palpitations.       Improving  Gastrointestinal: Negative.   Musculoskeletal: Negative.   Skin: Negative.   Neurological: Negative.   Psychiatric/Behavioral: Negative.       Objective:   Physical Exam  Constitutional: She is oriented to person, place, and time. She appears well-developed and well-nourished.  HENT:  Head: Normocephalic and atraumatic.  Eyes: EOM are normal.  Neck: Normal range of motion.  Cardiovascular: Normal rate and regular rhythm.   Pulmonary/Chest: Effort normal and breath sounds normal. No respiratory distress. She has no wheezes. She has no rales.  Abdominal: Soft. She exhibits no distension. There is no tenderness. There is no rebound.  Musculoskeletal: She exhibits edema.  1+ edema bilateral to mid shin  Neurological: She is alert and oriented to person, place, and time.  Skin: Skin is warm and dry.   Vitals:   08/10/16 0958  BP: 120/72  Pulse: 86  Resp: 14  Temp:  98.5 F (36.9 C)  TempSrc: Oral  SpO2: 93%  Weight: 249 lb (112.9 kg)  Height: 5\' 7"  (1.702 m)      Assessment & Plan:  Flu shot given at visit.

## 2016-08-10 NOTE — Assessment & Plan Note (Signed)
BP at goal on her lasix and taking potassium. Checking BMP and adjust as needed.

## 2016-08-10 NOTE — Assessment & Plan Note (Signed)
CBC stable in the hospital recently.

## 2016-08-10 NOTE — Progress Notes (Signed)
Pre visit review using our clinic review tool, if applicable. No additional management support is needed unless otherwise documented below in the visit note. 

## 2016-08-11 ENCOUNTER — Ambulatory Visit (INDEPENDENT_AMBULATORY_CARE_PROVIDER_SITE_OTHER): Payer: Medicare Other | Admitting: Physician Assistant

## 2016-08-11 ENCOUNTER — Encounter: Payer: Self-pay | Admitting: Physician Assistant

## 2016-08-11 ENCOUNTER — Other Ambulatory Visit: Payer: Self-pay | Admitting: Physician Assistant

## 2016-08-11 VITALS — BP 122/66 | HR 76 | Ht 67.0 in | Wt 249.1 lb

## 2016-08-11 DIAGNOSIS — I5033 Acute on chronic diastolic (congestive) heart failure: Secondary | ICD-10-CM

## 2016-08-11 DIAGNOSIS — Z950 Presence of cardiac pacemaker: Secondary | ICD-10-CM

## 2016-08-11 DIAGNOSIS — I480 Paroxysmal atrial fibrillation: Secondary | ICD-10-CM | POA: Diagnosis not present

## 2016-08-11 DIAGNOSIS — I10 Essential (primary) hypertension: Secondary | ICD-10-CM | POA: Diagnosis not present

## 2016-08-11 DIAGNOSIS — I495 Sick sinus syndrome: Secondary | ICD-10-CM

## 2016-08-11 DIAGNOSIS — I2510 Atherosclerotic heart disease of native coronary artery without angina pectoris: Secondary | ICD-10-CM

## 2016-08-11 MED ORDER — POTASSIUM CHLORIDE CRYS ER 20 MEQ PO TBCR: 40.0000 meq | EXTENDED_RELEASE_TABLET | Freq: Once | ORAL | 3 refills | Status: DC

## 2016-08-11 NOTE — Patient Instructions (Addendum)
Medication Instructions:  Your physician recommends that you continue on your current medications as directed. Please refer to the Current Medication list given to you today.   Labwork: None ordered  Testing/Procedures: None ordered  Follow-Up: Your physician recommends that you schedule a follow-up appointment in: 3 MONTHS WITH DR. SMITH    Any Other Special Instructions Will Be Listed Below (If Applicable).     If you need a refill on your cardiac medications before your next appointment, please call your pharmacy.   

## 2016-08-12 ENCOUNTER — Encounter: Payer: Self-pay | Admitting: *Deleted

## 2016-08-12 ENCOUNTER — Other Ambulatory Visit: Payer: Self-pay | Admitting: *Deleted

## 2016-08-12 NOTE — Patient Outreach (Signed)
[facility]) Care Management  08/12/2016  [name] Dec 22, 1940 [id number]   RN attempted the third attempt to reach pt however only able to leave a HIPAA approved voice message. Will send outreach letter and allow pt time to return the call or to respond.   Raina Mina, RN Care Management Coordinator [place] Office [phone]

## 2016-08-25 DIAGNOSIS — M5416 Radiculopathy, lumbar region: Secondary | ICD-10-CM | POA: Diagnosis not present

## 2016-08-25 DIAGNOSIS — G8929 Other chronic pain: Secondary | ICD-10-CM | POA: Diagnosis not present

## 2016-08-25 DIAGNOSIS — E119 Type 2 diabetes mellitus without complications: Secondary | ICD-10-CM | POA: Diagnosis not present

## 2016-08-25 DIAGNOSIS — T451X5A Adverse effect of antineoplastic and immunosuppressive drugs, initial encounter: Secondary | ICD-10-CM | POA: Diagnosis not present

## 2016-08-25 DIAGNOSIS — I10 Essential (primary) hypertension: Secondary | ICD-10-CM | POA: Diagnosis not present

## 2016-08-25 DIAGNOSIS — E785 Hyperlipidemia, unspecified: Secondary | ICD-10-CM | POA: Diagnosis not present

## 2016-08-25 DIAGNOSIS — G620 Drug-induced polyneuropathy: Secondary | ICD-10-CM | POA: Diagnosis not present

## 2016-08-25 DIAGNOSIS — G894 Chronic pain syndrome: Secondary | ICD-10-CM | POA: Diagnosis not present

## 2016-08-27 ENCOUNTER — Other Ambulatory Visit: Payer: Self-pay | Admitting: *Deleted

## 2016-08-27 ENCOUNTER — Encounter: Payer: Self-pay | Admitting: *Deleted

## 2016-08-27 NOTE — Patient Outreach (Signed)
[facility]) Care Management  08/27/2016  [name] 1940-09-15 [id number]   RN has attempted several times to contact this pt for services and sent an outreach letter with no response. Will close this case and notify pt's primary provider of pt's disposition with [facility].  Raina Mina, RN Care Management Coordinator [place] Office [phone]

## 2016-09-13 ENCOUNTER — Telehealth: Payer: Self-pay | Admitting: Cardiology

## 2016-09-13 ENCOUNTER — Ambulatory Visit (INDEPENDENT_AMBULATORY_CARE_PROVIDER_SITE_OTHER): Payer: Medicare Other | Admitting: *Deleted

## 2016-09-13 DIAGNOSIS — I495 Sick sinus syndrome: Secondary | ICD-10-CM

## 2016-09-13 LAB — CUP PACEART REMOTE DEVICE CHECK
Battery Voltage: 2.99 V
Brady Statistic AP VP Percent: 19 %
Brady Statistic RA Percent Paced: 13 %
Brady Statistic RV Percent Paced: 99 %
Implantable Lead Implant Date: 20150506
Implantable Pulse Generator Implant Date: 20150506
Lead Channel Impedance Value: 430 Ohm
Lead Channel Pacing Threshold Amplitude: 1.25 V
Lead Channel Pacing Threshold Pulse Width: 0.4 ms
Lead Channel Pacing Threshold Pulse Width: 0.6 ms
Lead Channel Setting Pacing Amplitude: 0.75 V
Lead Channel Setting Pacing Amplitude: 2.5 V
Lead Channel Setting Sensing Sensitivity: 4 mV
MDC IDC LEAD IMPLANT DT: 20150506
MDC IDC LEAD LOCATION: 753859
MDC IDC LEAD LOCATION: 753860
MDC IDC MSMT BATTERY REMAINING LONGEVITY: 117 mo
MDC IDC MSMT BATTERY REMAINING PERCENTAGE: 95.5 %
MDC IDC MSMT LEADCHNL RA IMPEDANCE VALUE: 360 Ohm
MDC IDC MSMT LEADCHNL RA SENSING INTR AMPL: 0.6 mV
MDC IDC MSMT LEADCHNL RV PACING THRESHOLD AMPLITUDE: 0.5 V
MDC IDC MSMT LEADCHNL RV SENSING INTR AMPL: 10.9 mV
MDC IDC PG SERIAL: 3013730
MDC IDC SESS DTM: 20180115070020
MDC IDC SET LEADCHNL RV PACING PULSEWIDTH: 0.4 ms
MDC IDC STAT BRADY AP VS PERCENT: 1 %
MDC IDC STAT BRADY AS VP PERCENT: 81 %
MDC IDC STAT BRADY AS VS PERCENT: 1 %

## 2016-09-13 NOTE — Progress Notes (Signed)
Remote pacemaker transmission.   

## 2016-09-13 NOTE — Telephone Encounter (Signed)
Spoke with pt and reminded pt of remote transmission that is due today. Pt verbalized understanding.   

## 2016-09-14 ENCOUNTER — Telehealth: Payer: Self-pay | Admitting: Interventional Cardiology

## 2016-09-14 NOTE — Telephone Encounter (Signed)
Pt states weight yesterday was 255lbs, today is 252lbs.  States weight has been up and down since she seen Bonney Leitz, PA-C in December.  Wt on 1/9 was 260lbs.  Denies any changes in SOB.  States still has swelling in ankles and feet.  This improves at night but she swells right back up as soon as she gets up in the mornings.  Denies diet changes. Pt wears compression stockings daily.  Pt states that she knows she is holding fluid and is concerned as she does not want to end up back in the hospital.  Will route to Dr. [name] for review and advisement.

## 2016-09-14 NOTE — Telephone Encounter (Signed)
[name] is calling in reference to gaining and losing weight . She lost weight over night , she states that she lost 5lbs last night . Please call

## 2016-09-14 NOTE — Telephone Encounter (Signed)
Begin 2L fluid restriction daily, continue same diuretic dose. Make sure weights are done daily prior to eating breakfast, after urinating and with the same amount of clothes each time (pajamas). No change needed now as weight close to 149 kg noted in December when she saw [place].

## 2016-09-14 NOTE — Telephone Encounter (Signed)
Spoke with pt and made her aware of Dr. Smith's recommendations. Pt verbalized understanding and was in agreement with this plan.  

## 2016-09-22 ENCOUNTER — Encounter: Payer: Self-pay | Admitting: Cardiology

## 2016-10-10 ENCOUNTER — Encounter: Payer: Self-pay | Admitting: Internal Medicine

## 2016-10-12 ENCOUNTER — Other Ambulatory Visit: Payer: Self-pay | Admitting: Physician Assistant

## 2016-10-12 MED ORDER — FUROSEMIDE 80 MG PO TABS: 80.0000 mg | ORAL_TABLET | Freq: Every day | ORAL | 9 refills | Status: DC

## 2016-10-12 NOTE — Telephone Encounter (Signed)
Called pharmacy and gave a verbal order over the phone because the pharmacy has changed from [facility] to [facility] and it was not letting me send it escribe. They are faxing the information to [place] to be able to escribe.

## 2016-10-12 NOTE — Addendum Note (Signed)
Addended by: Derl Barrow on: 10/12/2016 02:29 PM   Modules accepted: Orders

## 2016-11-02 ENCOUNTER — Encounter: Payer: Self-pay | Admitting: Interventional Cardiology

## 2016-11-02 ENCOUNTER — Other Ambulatory Visit: Payer: Self-pay | Admitting: Internal Medicine

## 2016-11-08 ENCOUNTER — Ambulatory Visit: Payer: Medicare Other | Admitting: Internal Medicine

## 2016-11-10 ENCOUNTER — Ambulatory Visit (INDEPENDENT_AMBULATORY_CARE_PROVIDER_SITE_OTHER): Payer: Medicare Other | Admitting: Interventional Cardiology

## 2016-11-10 ENCOUNTER — Encounter: Payer: Self-pay | Admitting: Interventional Cardiology

## 2016-11-10 VITALS — BP 142/70 | HR 69 | Ht 66.0 in | Wt 252.0 lb

## 2016-11-10 DIAGNOSIS — I5032 Chronic diastolic (congestive) heart failure: Secondary | ICD-10-CM | POA: Diagnosis not present

## 2016-11-10 DIAGNOSIS — I480 Paroxysmal atrial fibrillation: Secondary | ICD-10-CM

## 2016-11-10 DIAGNOSIS — I2510 Atherosclerotic heart disease of native coronary artery without angina pectoris: Secondary | ICD-10-CM

## 2016-11-10 DIAGNOSIS — I442 Atrioventricular block, complete: Secondary | ICD-10-CM

## 2016-11-10 DIAGNOSIS — I10 Essential (primary) hypertension: Secondary | ICD-10-CM | POA: Diagnosis not present

## 2016-11-10 NOTE — Progress Notes (Signed)
Cardiology Office Note    Date:  11/10/2016   ID:  SHAYA REDDICK, DOB [birth year], MRN [id number]  PCP:  Hoyt Koch, MD  Cardiologist: Sinclair Grooms, MD   Chief Complaint  Patient presents with  . Congestive Heart Failure    History of Present Illness:  Mary [name] is a 76 y.o. female  with a history of chronic diastolic CHF, tachy-brady syndrome s/p PPM ([employer]), PAF on Eliquis, DVT ( on Eliquis), HTN, HLD, DM, OSA, non obstructive by cath (01/2015) who presents to clinic for post hospital follow up.   Here today for follow-up of chronic diastolic heart failure. Her weight is stable compared to the December weights being 252 pounds now and 249 pounds in late December.  She is watching fluid in her diet. Her breathing is markedly improved. She has cut down on salt intake. She takes a diuretic daily. She is slipped back to not taking her weight every day. I've encouraged her to weigh daily and he call us if 3 pounds/5 pound change in one day/one week.   Past Medical History:  Diagnosis Date  . Arthritis   . CAD (coronary artery disease)    non obstructive  . Carotid stenosis    Carotid US 5/16:  Bilateral ICA 1-39%; L vertebral retrograde; L BP 126/49, R BP 140/57  . CHF (congestive heart failure) ([place])   . Chronic lower back pain   . Colon cancer ([place])    a. s/p chemo  . Colon polyps   . Diabetic peripheral neuropathy ([place])   . DVT (deep venous thrombosis) ([place]) 2013   "twice behind knee on left side" (06/25/2013)  . Glaucoma   . Hyperlipidemia   . Hypertension   . Hypothyroidism   . Iron deficiency anemia   . Multinodular goiter   . Sleep apnea    a. resolved post weight loss   . Type II diabetes mellitus ([place])     Past Surgical History:  Procedure Laterality Date  . CARDIAC CATHETERIZATION  2006   Never had PCI, 3 caths total. Last one in [place]  . CARDIAC CATHETERIZATION N/A 02/04/2015   Procedure: Left Heart Cath and Coronary Angiography;   Surgeon: Belva Crome, MD;  Location: [facility];  Service: Cardiovascular;  Laterality: N/A;  . CATARACT EXTRACTION, BILATERAL Bilateral 2013   "and put stent in my left eye for glaucoma" (06/25/2013)  . CHOLECYSTECTOMY  1980's  . COLECTOMY  10/2010   Tumor removal  . EYE MUSCLE SURGERY Bilateral ~ 1963   "muscles too long; eyes would go out and up; tied muscles to hold my eyes straight" (06/25/2013)  . LEAD REVISION  01-03-2014   atrial lead revision by Dr Rayann Heman  . LEAD REVISION N/A 01/03/2014   Procedure: LEAD REVISION;  Surgeon: Coralyn Mark, MD;  Location: [facility];  Service: Cardiovascular;  Laterality: N/A;  . PACEMAKER INSERTION  01/02/2014   STJ Assurity dual chamber pacemaker implnated by Dr Lovena Le for SSS  . PERMANENT PACEMAKER INSERTION N/A 01/02/2014   Procedure: PERMANENT PACEMAKER INSERTION;  Surgeon: Evans Lance, MD;  [facility]: [facility] C[facility];  Service: Cardiovascular;  Laterality: N/A;  . PORTACATH PLACEMENT Left 11/2010  . ROUX-EN-Y GASTRIC BYPASS  2009  . ULNAR TUNNEL RELEASE Left ~ 2008  . UMBILICAL HERNIA REPAIR  1970's?    (06/25/2013)    Current Medications: Outpatient Medications Prior to Visit  Medication Sig Dispense Refill  . atorvastatin (  LIPITOR) 80 MG tablet Take 80 mg by mouth daily.    . bimatoprost (LUMIGAN) 0.01 % SOLN Place 1 drop into the right eye at bedtime.     Marland Kitchen buPROPion (WELLBUTRIN XL) 150 MG 24 hr tablet take 1 tablet by mouth daily 30 tablet 6  . ELIQUIS 5 MG TABS tablet take 1 tablet by mouth twice a day 180 tablet 3  . furosemide (LASIX) 80 MG tablet Take 1 tablet (80 mg total) by mouth daily. 30 tablet 9  . gabapentin (NEURONTIN) 300 MG capsule Take 300 mg by mouth 2 (two) times daily.     Marland Kitchen HYDROcodone-acetaminophen (NORCO/VICODIN) 5-325 MG tablet Take 1 tablet by mouth every 6 (six) hours as needed for moderate pain. 45 tablet 0  . insulin glargine (LANTUS) 100 unit/mL SOPN Inject 24 Units into the skin at bedtime.    Marland Kitchen  levothyroxine (SYNTHROID, LEVOTHROID) 112 MCG tablet Take 112 mcg by mouth daily before breakfast.    . metFORMIN (GLUCOPHAGE) 1000 MG tablet Take 1 tablet (1,000 mg total) by mouth 2 (two) times daily with a meal. 180 tablet 3  . nitroGLYCERIN (NITROSTAT) 0.4 MG SL tablet Place 1 tablet (0.4 mg total) under the tongue every 5 (five) minutes as needed for chest pain. 90 tablet 3  . potassium chloride SA (K-DUR,KLOR-CON) 20 MEQ tablet Take 2 tablets (40 mEq total) by mouth once. 180 tablet 3   No facility-administered medications prior to visit.      Allergies:   Other; Adhesive [tape]; Aspirin; Latex; and Nsaids   Social History   Social History  . Marital status: Married    Spouse name: [name]  . Number of children: 0  . Years of education: N/A   Occupational History  . Retired     office work   Social History Main Topics  . Smoking status: Light Tobacco Smoker    Packs/day: 1.00    Years: 30.00    Types: Cigarettes    Last attempt to quit: 05/03/2013  . Smokeless tobacco: Never Used     Comment: 06/25/2013 "smoked off and on since I was 22; stopped 10/2007 til 08/30/2012 then started again cause of stress"  . Alcohol use 0.0 oz/week     Comment: 06/25/2013 "glass of wine 1-2 times/yr"  . Drug use: No  . Sexual activity: Yes   Other Topics Concern  . None   Social History Narrative   Married to husband, [name]   No children   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   Recently moved back here from [place]      Family History:  The patient's family history includes Bladder Cancer in her cousin; Breast cancer in her maternal aunt, mother, and sister; Clotting disorder in her sister; Colon polyps in her brother; Diabetes in her maternal grandmother, mother, and sister; Heart attack in her brother, father, and sister; Heart disease in her father; Pancreatic cancer in her brother; Prostate cancer in her brother.   ROS:   Please see the history of present  illness.    Breathing is still not completely normal. She denies snoring. Leg swelling is resolved. She does have joint swelling leg pain balance difficulty back pain muscle pain and easy bruising.  All other systems reviewed and are negative.   PHYSICAL EXAM:   VS:  BP (!) 142/70 (BP Location: Left Arm)   Pulse 69   Ht 5\' 6"  (1.676 m)   Wt 252 lb (114.3 kg)  BMI 40.67 kg/m    GEN: Well nourished, well developed, in no acute distress  HEENT: normal  Neck: no JVD, carotid bruits, or masses Cardiac: RRR; no murmurs, rubs, or gallops,no edema  Respiratory:  clear to auscultation bilaterally, normal work of breathing GI: soft, nontender, nondistended, + BS MS: no deformity or atrophy  Skin: warm and dry, no rash Neuro:  Alert and Oriented x 3, Strength and sensation are intact Psych: euthymic mood, full affect  Wt Readings from Last 3 Encounters:  11/10/16 252 lb (114.3 kg)  08/11/16 249 lb 1.9 oz (113 kg)  08/10/16 249 lb (112.9 kg)      Studies/Labs Reviewed:   EKG:  EKG  Not repeated  Recent Labs: 08/01/2016: B Natriuretic Peptide 66.3 08/02/2016: TSH 4.896 08/04/2016: ALT 13; Hemoglobin 11.8; Platelets 317 08/10/2016: BUN 34; Creatinine, Ser 1.17; Potassium 4.6; Sodium 139   Lipid Panel    Component Value Date/Time   CHOL 178 08/02/2016 0102   TRIG 229 (H) 08/02/2016 0102   HDL 42 08/02/2016 0102   CHOLHDL 4.2 08/02/2016 0102   VLDL 46 (H) 08/02/2016 0102   LDLCALC 90 08/02/2016 0102   LDLDIRECT 79.0 03/23/2016 1529    Additional studies/ records that were reviewed today include:  No new data    ASSESSMENT:    1. Chronic diastolic heart failure ([place])   2. Coronary artery disease involving native coronary artery of native heart without angina pectoris   3. Essential hypertension   4. PAF (paroxysmal atrial fibrillation) ([place])   5. Atrioventricular block, complete (H[facility])      PLAN:  In order of problems listed above:  1. No evidence of volume overload.  Continue to monitor fluid and salt intake. I reiterated the importance of weighing daily and notifying us of 3/5 pound weight change. Basic metabolic panel will be obtained today. 2. Call of chest discomfort or nitroglycerin use. 3. Target blood pressure less than 140/90 mmHg. 4. Asymptomatic without recent episodes of atrial fibrillation. 5. Has pacemaker and telemetry is being followed without evidence of atrial fibrillation.  Clinical follow-up with team member Nell Range, [facility] in 3 months and with me in 6 months for chronic follow-up of CHF. She is doing quite well at this time with stable weight    Medication Adjustments/Labs and Tests Ordered: Current medicines are reviewed at length with the patient today.  Concerns regarding medicines are outlined above.  Medication changes, Labs and Tests ordered today are listed in the Patient Instructions below. Patient Instructions  Medication Instructions:  None  Labwork: BMET today  Testing/Procedures: None  Follow-Up: Your physician recommends that you schedule a follow-up appointment in: 3 months with Bonney Leitz, PA-C.  Your physician wants you to follow-up in: 6 months with Dr. [name].  You will receive a reminder letter in the mail two months in advance. If you don't receive a letter, please call our office to schedule the follow-up appointment.    Any Other Special Instructions Will Be Listed Below (If Applicable).     If you need a refill on your cardiac medications before your next appointment, please call your pharmacy.      Signed, Sinclair Grooms, MD  11/10/2016 2:27 PM    [facility] [place], [place], [place]  [address] Phone: [phone]; Fax: [phone]

## 2016-11-10 NOTE — Patient Instructions (Signed)
Medication Instructions:  None  Labwork: BMET today  Testing/Procedures: None  Follow-Up: Your physician recommends that you schedule a follow-up appointment in: 3 months with Bonney Leitz, PA-C.  Your physician wants you to follow-up in: 6 months with Dr. [name].  You will receive a reminder letter in the mail two months in advance. If you don't receive a letter, please call our office to schedule the follow-up appointment.    Any Other Special Instructions Will Be Listed Below (If Applicable).     If you need a refill on your cardiac medications before your next appointment, please call your pharmacy.

## 2016-11-11 LAB — BASIC METABOLIC PANEL
BUN/Creatinine Ratio: 20 (ref 12–28)
BUN: 30 mg/dL — ABNORMAL HIGH (ref 8–27)
CO2: 29 mmol/L (ref 18–29)
Calcium: 9.5 mg/dL (ref 8.7–10.3)
Chloride: 90 mmol/L — ABNORMAL LOW (ref 96–106)
Creatinine, Ser: 1.5 mg/dL — ABNORMAL HIGH (ref 0.57–1.00)
GFR, EST AFRICAN AMERICAN: 39 mL/min/{1.73_m2} — AB (ref >59)
GFR, EST NON AFRICAN AMERICAN: 34 mL/min/{1.73_m2} — AB (ref >59)
Glucose: 211 mg/dL — ABNORMAL HIGH (ref 65–99)
POTASSIUM: 4.4 mmol/L (ref 3.5–5.2)
SODIUM: 138 mmol/L (ref 134–144)

## 2016-11-12 ENCOUNTER — Ambulatory Visit (INDEPENDENT_AMBULATORY_CARE_PROVIDER_SITE_OTHER): Payer: Medicare Other | Admitting: Internal Medicine

## 2016-11-12 ENCOUNTER — Encounter: Payer: Self-pay | Admitting: Internal Medicine

## 2016-11-12 VITALS — BP 136/70 | HR 69 | Temp 98.2°F | Ht 66.0 in | Wt 252.0 lb

## 2016-11-12 DIAGNOSIS — Z720 Tobacco use: Secondary | ICD-10-CM | POA: Diagnosis not present

## 2016-11-12 DIAGNOSIS — I5032 Chronic diastolic (congestive) heart failure: Secondary | ICD-10-CM

## 2016-11-12 DIAGNOSIS — I2510 Atherosclerotic heart disease of native coronary artery without angina pectoris: Secondary | ICD-10-CM

## 2016-11-12 DIAGNOSIS — C189 Malignant neoplasm of colon, unspecified: Secondary | ICD-10-CM | POA: Diagnosis not present

## 2016-11-12 NOTE — Progress Notes (Signed)
Pre visit review using our clinic review tool, if applicable. No additional management support is needed unless otherwise documented below in the visit note. 

## 2016-11-12 NOTE — Progress Notes (Signed)
   Subjective:    Patient ID: Mary [name], female    DOB: 1940/12/19, 76 y.o.   MRN: [id number]  HPI The patient is a 76 YO female coming in for follow up of her diastolic heart failure. At last visit she was still up several pounds from dry weight and we adjusted medications. She has been back to cardiology since that time and they have adjusted her medications and put her on fluid restriction as well. She is doing better and is walking more and not getting winded as easily. She is able to do some light housework without stopping. She denies dietary changes. She is working on doing the right things for her heart as she does not want to end up in the hospital again. No chest pains or SOB or abdominal pain.   Review of Systems  Constitutional: Positive for activity change and fatigue. Negative for appetite change, fever and unexpected weight change.       Doing more activity  HENT: Negative.   Respiratory: Negative.   Cardiovascular: Negative.   Gastrointestinal: Negative.   Musculoskeletal: Positive for arthralgias. Negative for back pain, gait problem, joint swelling and myalgias.  Neurological: Negative.       Objective:   Physical Exam  Constitutional: She is oriented to person, place, and time. She appears well-developed and well-nourished.  Overweight  HENT:  Head: Normocephalic and atraumatic.  Eyes: EOM are normal.  Neck: Normal range of motion.  Cardiovascular: Normal rate and regular rhythm.   Pulmonary/Chest: Effort normal and breath sounds normal. No respiratory distress. She has no wheezes. She has no rales. She exhibits no tenderness.  Abdominal: Soft.  Neurological: She is alert and oriented to person, place, and time.  Skin: Skin is warm and dry.   Vitals:   11/12/16 1554  BP: 136/70  Pulse: 69  Temp: 98.2 F (36.8 C)  TempSrc: Oral  SpO2: 97%  Weight: 252 lb (114.3 kg)  Height: 5\' 6"  (1.676 m)      Assessment & Plan:

## 2016-11-12 NOTE — Patient Instructions (Signed)
We do not need blood work today and will not change the medicines.

## 2016-11-13 NOTE — Assessment & Plan Note (Signed)
Did quit for several months this winter and is now smoking some again but intends to quit again and for good this time. Longest quit time is about 5 years and did cold [place] each time. Talked about how this is a good sign for eventual success in quitting.

## 2016-11-13 NOTE — Assessment & Plan Note (Signed)
CEA entered for this spring as she no longer sees oncology.

## 2016-11-13 NOTE — Assessment & Plan Note (Signed)
No flare today, fluid balance good and no adjustment to her regimen needed. Reviewed last BMP from cardiology and talked with her about need to adjust diuretics if further changes in creatinine.

## 2016-11-15 ENCOUNTER — Telehealth: Payer: Self-pay | Admitting: *Deleted

## 2016-11-15 DIAGNOSIS — I10 Essential (primary) hypertension: Secondary | ICD-10-CM

## 2016-11-15 NOTE — Telephone Encounter (Signed)
-----   Message from Belva Crome, MD sent at 11/11/2016  8:08 AM EDT ----- Let the patient know the labs are mildly abnormal with some evidence of slight volume contraction. I don't believe any changes are necessary and medical therapy. Repeat basic metabolic panel in 3 months. Slightly liberalize fluid intake. Continue to weigh daily A copy will be sent to Hoyt Koch, MD

## 2016-11-15 NOTE — Telephone Encounter (Signed)
Spoke with husband, DPR on file. Made husband aware of lab results. Will schedule pt to have repeat BMET at time of follow up appt.

## 2016-12-10 DIAGNOSIS — E039 Hypothyroidism, unspecified: Secondary | ICD-10-CM | POA: Diagnosis not present

## 2016-12-10 DIAGNOSIS — E1142 Type 2 diabetes mellitus with diabetic polyneuropathy: Secondary | ICD-10-CM | POA: Diagnosis not present

## 2016-12-10 DIAGNOSIS — E049 Nontoxic goiter, unspecified: Secondary | ICD-10-CM | POA: Diagnosis not present

## 2016-12-10 DIAGNOSIS — Z794 Long term (current) use of insulin: Secondary | ICD-10-CM | POA: Diagnosis not present

## 2016-12-10 DIAGNOSIS — I509 Heart failure, unspecified: Secondary | ICD-10-CM | POA: Diagnosis not present

## 2016-12-16 ENCOUNTER — Encounter: Payer: Self-pay | Admitting: Internal Medicine

## 2016-12-19 ENCOUNTER — Encounter: Payer: Self-pay | Admitting: Internal Medicine

## 2016-12-20 ENCOUNTER — Ambulatory Visit (INDEPENDENT_AMBULATORY_CARE_PROVIDER_SITE_OTHER): Payer: Medicare Other | Admitting: *Deleted

## 2016-12-20 DIAGNOSIS — I442 Atrioventricular block, complete: Secondary | ICD-10-CM

## 2016-12-20 NOTE — Progress Notes (Signed)
Remote pacemaker transmission.   

## 2016-12-23 ENCOUNTER — Encounter: Payer: Self-pay | Admitting: Cardiology

## 2016-12-23 LAB — CUP PACEART REMOTE DEVICE CHECK
Battery Remaining Longevity: 113 mo
Battery Remaining Percentage: 95.5 %
Battery Voltage: 2.99 V
Brady Statistic AP VP Percent: 20 %
Brady Statistic AP VS Percent: 1 %
Brady Statistic AS VS Percent: 1 %
Brady Statistic RV Percent Paced: 99 %
Date Time Interrogation Session: 20180423080023
Implantable Lead Location: 753859
Lead Channel Impedance Value: 410 Ohm
Lead Channel Pacing Threshold Amplitude: 0.75 V
Lead Channel Pacing Threshold Pulse Width: 0.4 ms
Lead Channel Sensing Intrinsic Amplitude: 0.9 mV
Lead Channel Sensing Intrinsic Amplitude: 10 mV
Lead Channel Setting Pacing Amplitude: 1 V
Lead Channel Setting Pacing Amplitude: 2.5 V
Lead Channel Setting Pacing Pulse Width: 0.4 ms
Lead Channel Setting Sensing Sensitivity: 4 mV
MDC IDC LEAD IMPLANT DT: 20150506
MDC IDC LEAD IMPLANT DT: 20150506
MDC IDC LEAD LOCATION: 753860
MDC IDC MSMT LEADCHNL RA IMPEDANCE VALUE: 360 Ohm
MDC IDC MSMT LEADCHNL RA PACING THRESHOLD AMPLITUDE: 1.25 V
MDC IDC MSMT LEADCHNL RA PACING THRESHOLD PULSEWIDTH: 0.6 ms
MDC IDC PG IMPLANT DT: 20150506
MDC IDC STAT BRADY AS VP PERCENT: 80 %
MDC IDC STAT BRADY RA PERCENT PACED: 13 %
Pulse Gen Model: 2240
Pulse Gen Serial Number: 3013730

## 2017-01-19 ENCOUNTER — Other Ambulatory Visit: Payer: Self-pay | Admitting: Internal Medicine

## 2017-01-19 ENCOUNTER — Encounter: Payer: Self-pay | Admitting: Internal Medicine

## 2017-01-26 ENCOUNTER — Encounter: Payer: Self-pay | Admitting: *Deleted

## 2017-01-31 ENCOUNTER — Other Ambulatory Visit: Payer: Self-pay | Admitting: Internal Medicine

## 2017-01-31 NOTE — Telephone Encounter (Signed)
Historical provider for requested medication

## 2017-02-15 ENCOUNTER — Ambulatory Visit: Payer: Medicare Other | Admitting: Physician Assistant

## 2017-02-15 ENCOUNTER — Other Ambulatory Visit: Payer: Medicare Other

## 2017-03-14 DIAGNOSIS — E039 Hypothyroidism, unspecified: Secondary | ICD-10-CM | POA: Diagnosis not present

## 2017-03-14 DIAGNOSIS — E049 Nontoxic goiter, unspecified: Secondary | ICD-10-CM | POA: Diagnosis not present

## 2017-03-14 DIAGNOSIS — I509 Heart failure, unspecified: Secondary | ICD-10-CM | POA: Diagnosis not present

## 2017-03-14 DIAGNOSIS — Z794 Long term (current) use of insulin: Secondary | ICD-10-CM | POA: Diagnosis not present

## 2017-03-14 DIAGNOSIS — Z720 Tobacco use: Secondary | ICD-10-CM | POA: Diagnosis not present

## 2017-03-14 DIAGNOSIS — E1142 Type 2 diabetes mellitus with diabetic polyneuropathy: Secondary | ICD-10-CM | POA: Diagnosis not present

## 2017-03-19 ENCOUNTER — Other Ambulatory Visit: Payer: Self-pay | Admitting: Internal Medicine

## 2017-03-21 ENCOUNTER — Ambulatory Visit (INDEPENDENT_AMBULATORY_CARE_PROVIDER_SITE_OTHER): Payer: Medicare Other | Admitting: *Deleted

## 2017-03-21 DIAGNOSIS — I442 Atrioventricular block, complete: Secondary | ICD-10-CM

## 2017-03-22 ENCOUNTER — Encounter: Payer: Self-pay | Admitting: Internal Medicine

## 2017-03-22 NOTE — Progress Notes (Signed)
Remote pacemaker transmission.   

## 2017-03-24 ENCOUNTER — Encounter: Payer: Self-pay | Admitting: Cardiology

## 2017-03-28 LAB — CUP PACEART REMOTE DEVICE CHECK
Battery Remaining Percentage: 95.5 %
Brady Statistic AP VS Percent: 1 %
Brady Statistic RV Percent Paced: 99 %
Date Time Interrogation Session: 20180723082141
Implantable Lead Implant Date: 20150506
Implantable Lead Location: 753859
Lead Channel Pacing Threshold Amplitude: 0.75 V
Lead Channel Sensing Intrinsic Amplitude: 0.8 mV
Lead Channel Sensing Intrinsic Amplitude: 10.2 mV
Lead Channel Setting Pacing Amplitude: 2.5 V
Lead Channel Setting Pacing Pulse Width: 0.4 ms
Lead Channel Setting Sensing Sensitivity: 4 mV
MDC IDC LEAD IMPLANT DT: 20150506
MDC IDC LEAD LOCATION: 753860
MDC IDC MSMT BATTERY REMAINING LONGEVITY: 114 mo
MDC IDC MSMT BATTERY VOLTAGE: 2.99 V
MDC IDC MSMT LEADCHNL RA IMPEDANCE VALUE: 360 Ohm
MDC IDC MSMT LEADCHNL RA PACING THRESHOLD AMPLITUDE: 1.25 V
MDC IDC MSMT LEADCHNL RA PACING THRESHOLD PULSEWIDTH: 0.6 ms
MDC IDC MSMT LEADCHNL RV IMPEDANCE VALUE: 400 Ohm
MDC IDC MSMT LEADCHNL RV PACING THRESHOLD PULSEWIDTH: 0.4 ms
MDC IDC PG IMPLANT DT: 20150506
MDC IDC SET LEADCHNL RV PACING AMPLITUDE: 1 V
MDC IDC STAT BRADY AP VP PERCENT: 18 %
MDC IDC STAT BRADY AS VP PERCENT: 82 %
MDC IDC STAT BRADY AS VS PERCENT: 1 %
MDC IDC STAT BRADY RA PERCENT PACED: 12 %
Pulse Gen Model: 2240
Pulse Gen Serial Number: 3013730

## 2017-03-30 ENCOUNTER — Encounter: Payer: Self-pay | Admitting: Internal Medicine

## 2017-03-30 ENCOUNTER — Ambulatory Visit (INDEPENDENT_AMBULATORY_CARE_PROVIDER_SITE_OTHER): Payer: Medicare Other | Admitting: Internal Medicine

## 2017-03-30 VITALS — BP 152/66 | HR 61 | Ht 67.0 in | Wt 249.8 lb

## 2017-03-30 DIAGNOSIS — I480 Paroxysmal atrial fibrillation: Secondary | ICD-10-CM | POA: Diagnosis not present

## 2017-03-30 DIAGNOSIS — I10 Essential (primary) hypertension: Secondary | ICD-10-CM | POA: Diagnosis not present

## 2017-03-30 DIAGNOSIS — I2510 Atherosclerotic heart disease of native coronary artery without angina pectoris: Secondary | ICD-10-CM

## 2017-03-30 DIAGNOSIS — I442 Atrioventricular block, complete: Secondary | ICD-10-CM

## 2017-03-30 DIAGNOSIS — I441 Atrioventricular block, second degree: Secondary | ICD-10-CM

## 2017-03-30 LAB — CUP PACEART INCLINIC DEVICE CHECK
Brady Statistic RA Percent Paced: 13 %
Implantable Lead Implant Date: 20150506
Implantable Lead Implant Date: 20150506
Implantable Lead Location: 753859
Implantable Pulse Generator Implant Date: 20150506
Lead Channel Impedance Value: 362.5 Ohm
Lead Channel Impedance Value: 412.5 Ohm
Lead Channel Pacing Threshold Pulse Width: 0.4 ms
Lead Channel Sensing Intrinsic Amplitude: 0.9 mV
Lead Channel Sensing Intrinsic Amplitude: 10.2 mV
Lead Channel Setting Pacing Amplitude: 0.875
Lead Channel Setting Pacing Amplitude: 2.5 V
Lead Channel Setting Sensing Sensitivity: 4 mV
MDC IDC LEAD LOCATION: 753860
MDC IDC MSMT BATTERY REMAINING LONGEVITY: 124 mo
MDC IDC MSMT BATTERY VOLTAGE: 2.99 V
MDC IDC MSMT LEADCHNL RA PACING THRESHOLD AMPLITUDE: 1 V
MDC IDC MSMT LEADCHNL RA PACING THRESHOLD AMPLITUDE: 1 V
MDC IDC MSMT LEADCHNL RA PACING THRESHOLD PULSEWIDTH: 0.6 ms
MDC IDC MSMT LEADCHNL RA PACING THRESHOLD PULSEWIDTH: 0.6 ms
MDC IDC MSMT LEADCHNL RV PACING THRESHOLD AMPLITUDE: 0.75 V
MDC IDC PG SERIAL: 3013730
MDC IDC SESS DTM: 20180801145137
MDC IDC SET LEADCHNL RV PACING PULSEWIDTH: 0.4 ms
MDC IDC STAT BRADY RV PERCENT PACED: 99.39 %
Pulse Gen Model: 2240

## 2017-03-30 MED ORDER — LOSARTAN POTASSIUM 25 MG PO TABS: 25.0000 mg | ORAL_TABLET | Freq: Every day | ORAL | 11 refills | Status: DC

## 2017-03-30 NOTE — Progress Notes (Signed)
PCP: Hoyt Koch, MD Primary Cardiologist: Primary EP:  Dr Rayann Heman  Mary [name] is a 76 y.o. female who presents today for routine electrophysiology followup.  Since last being seen in our clinic, the patient reports doing very well.  Today, she denies symptoms of palpitations, chest pain, shortness of breath,  lower extremity edema, dizziness, presyncope, or syncope.  The patient is otherwise without complaint today.   Past Medical History:  Diagnosis Date  . Arthritis   . CAD (coronary artery disease)    non obstructive  . Carotid stenosis    Carotid US 5/16:  Bilateral ICA 1-39%; L vertebral retrograde; L BP 126/49, R BP 140/57  . CHF (congestive heart failure) ([place])   . Chronic lower back pain   . Colon cancer ([place])    a. s/p chemo  . Colon polyps   . Diabetic peripheral neuropathy ([place])   . DVT (deep venous thrombosis) ([place]) 2013   "twice behind knee on left side" (06/25/2013)  . Glaucoma   . Hyperlipidemia   . Hypertension   . Hypothyroidism   . Iron deficiency anemia   . Multinodular goiter   . Sleep apnea    a. resolved post weight loss   . Type II diabetes mellitus ([place])    Past Surgical History:  Procedure Laterality Date  . CARDIAC CATHETERIZATION  2006   Never had PCI, 3 caths total. Last one in [place]  . CARDIAC CATHETERIZATION N/A 02/04/2015   Procedure: Left Heart Cath and Coronary Angiography;  Surgeon: Belva Crome, MD;  Location: [facility];  Service: Cardiovascular;  Laterality: N/A;  . CATARACT EXTRACTION, BILATERAL Bilateral 2013   "and put stent in my left eye for glaucoma" (06/25/2013)  . CHOLECYSTECTOMY  1980's  . COLECTOMY  10/2010   Tumor removal  . EYE MUSCLE SURGERY Bilateral ~ 1963   "muscles too long; eyes would go out and up; tied muscles to hold my eyes straight" (06/25/2013)  . LEAD REVISION  01-03-2014   atrial lead revision by Dr Rayann Heman  . LEAD REVISION N/A 01/03/2014   Procedure: LEAD REVISION;  Surgeon: Coralyn Mark, MD;  Location: [facility] CATH LAB;  Service: Cardiovascular;  Laterality: N/A;  . PACEMAKER INSERTION  01/02/2014   STJ Assurity dual chamber pacemaker implnated by Dr Lovena Le for SSS  . PERMANENT PACEMAKER INSERTION N/A 01/02/2014   Procedure: PERMANENT PACEMAKER INSERTION;  Surgeon: Evans Lance, MD;  Location: [facility];  Service: Cardiovascular;  Laterality: N/A;  . PORTACATH PLACEMENT Left 11/2010  . ROUX-EN-Y GASTRIC BYPASS  2009  . ULNAR TUNNEL RELEASE Left ~ 2008  . UMBILICAL HERNIA REPAIR  1970's?    (06/25/2013)    ROS- all systems are reviewed and negative except as per HPI above  Current Outpatient Prescriptions  Medication Sig Dispense Refill  . atorvastatin (LIPITOR) 80 MG tablet TAKE 1 TABLET BY MOUTH ONCE DAILY 30 tablet 1  . bimatoprost (LUMIGAN) 0.01 % SOLN Place 1 drop into the right eye at bedtime.     Marland Kitchen buPROPion (WELLBUTRIN XL) 150 MG 24 hr tablet take 1 tablet by mouth daily 30 tablet 6  . ELIQUIS 5 MG TABS tablet take 1 tablet by mouth twice a day 180 tablet 3  . furosemide (LASIX) 80 MG tablet Take 1 tablet (80 mg total) by mouth daily. 30 tablet 9  . gabapentin (NEURONTIN) 300 MG capsule Take 300 mg by mouth daily.     Marland Kitchen HYDROcodone-acetaminophen (  NORCO/VICODIN) 5-325 MG tablet Take 1 tablet by mouth every 6 (six) hours as needed for moderate pain. 45 tablet 0  . insulin glargine (LANTUS) 100 unit/mL SOPN Inject 36 Units into the skin at bedtime.     Marland Kitchen levothyroxine (SYNTHROID, LEVOTHROID) 112 MCG tablet Take 112 mcg by mouth daily before breakfast.    . nitroGLYCERIN (NITROSTAT) 0.4 MG SL tablet Place 1 tablet (0.4 mg total) under the tongue every 5 (five) minutes as needed for chest pain. 90 tablet 3  . potassium chloride SA (K-DUR,KLOR-CON) 20 MEQ tablet Take 2 tablets (40 mEq total) by mouth once. 180 tablet 3   No current facility-administered medications for this visit.     Physical Exam: Vitals:   03/30/17 1408  BP: (!) 152/66  Pulse: 61  Weight:  249 lb 12.8 oz (113.3 kg)  Height: 5\' 7"  (1.702 m)    GEN- The patient is well appearing, alert and oriented x 3 today.   Head- normocephalic, atraumatic Eyes-  Sclera clear, conjunctiva pink Ears- hearing intact Oropharynx- clear Lungs- Clear to ausculation bilaterally, normal work of breathing Chest- pacemaker pocket is well healed Heart- Regular rate and rhythm, no murmurs, rubs or gallops, PMI not laterally displaced GI- soft, NT, ND, + BS Extremities- no clubbing, cyanosis, or edema  Pacemaker interrogation- reviewed in detail today,  See PACEART report  ekg tracing ordered today is personally reviewed and shows AV paced rhythm  Assessment and Plan:  1. Symptomatic second degree AV block Normal pacemaker function See Pace Art report No changes today  2. HTN Elevated Restart losartan 25mg  daily today (losartan/hct stopped in December due to low BP in the setting of CHF admission) 2 gram sodium restriction  3. Obesity Body mass index is 39.12 kg/m. Not making progress with weight loss  4. Afib On eliquis Stable No change required today  She quit smoking in July!  [place] Return to see EP NP every year  [name] MD, [facility] 03/30/2017 2:31 PM

## 2017-03-30 NOTE — Patient Instructions (Signed)
Medication Instructions:  START Losartan (Cozaar) 25 mg once daily   Labwork: None Ordered   Testing/Procedures: None Ordered   Follow-Up: Your physician wants you to follow-up in: 1 year with Chanetta Marshall, NP.  You will receive a reminder letter in the mail two months in advance. If you don't receive a letter, please call our office to schedule the follow-up appointment.   If you need a refill on your cardiac medications before your next appointment, please call your pharmacy.   Thank you for choosing [facility]! Christen Bame, RN [phone]

## 2017-04-01 ENCOUNTER — Encounter: Payer: Self-pay | Admitting: Internal Medicine

## 2017-04-20 ENCOUNTER — Other Ambulatory Visit: Payer: Self-pay | Admitting: Internal Medicine

## 2017-04-22 ENCOUNTER — Encounter (HOSPITAL_COMMUNITY): Payer: Self-pay | Admitting: Emergency Medicine

## 2017-04-22 ENCOUNTER — Emergency Department (HOSPITAL_COMMUNITY): Payer: Medicare Other

## 2017-04-22 DIAGNOSIS — I110 Hypertensive heart disease with heart failure: Secondary | ICD-10-CM | POA: Insufficient documentation

## 2017-04-22 DIAGNOSIS — R5383 Other fatigue: Secondary | ICD-10-CM | POA: Diagnosis present

## 2017-04-22 DIAGNOSIS — I471 Supraventricular tachycardia: Secondary | ICD-10-CM | POA: Insufficient documentation

## 2017-04-22 DIAGNOSIS — I509 Heart failure, unspecified: Secondary | ICD-10-CM | POA: Diagnosis not present

## 2017-04-22 DIAGNOSIS — E039 Hypothyroidism, unspecified: Secondary | ICD-10-CM | POA: Insufficient documentation

## 2017-04-22 DIAGNOSIS — D649 Anemia, unspecified: Secondary | ICD-10-CM | POA: Diagnosis not present

## 2017-04-22 DIAGNOSIS — I4891 Unspecified atrial fibrillation: Secondary | ICD-10-CM | POA: Insufficient documentation

## 2017-04-22 DIAGNOSIS — Z91040 Latex allergy status: Secondary | ICD-10-CM | POA: Diagnosis not present

## 2017-04-22 DIAGNOSIS — Z794 Long term (current) use of insulin: Secondary | ICD-10-CM | POA: Diagnosis not present

## 2017-04-22 DIAGNOSIS — E785 Hyperlipidemia, unspecified: Secondary | ICD-10-CM | POA: Insufficient documentation

## 2017-04-22 DIAGNOSIS — I2510 Atherosclerotic heart disease of native coronary artery without angina pectoris: Secondary | ICD-10-CM | POA: Insufficient documentation

## 2017-04-22 DIAGNOSIS — E119 Type 2 diabetes mellitus without complications: Secondary | ICD-10-CM | POA: Insufficient documentation

## 2017-04-22 DIAGNOSIS — Z79899 Other long term (current) drug therapy: Secondary | ICD-10-CM | POA: Diagnosis not present

## 2017-04-22 DIAGNOSIS — R000 Tachycardia, unspecified: Secondary | ICD-10-CM | POA: Diagnosis not present

## 2017-04-22 LAB — CBC
HEMATOCRIT: 35.9 % — AB (ref 36.0–46.0)
Hemoglobin: 11.6 g/dL — ABNORMAL LOW (ref 12.0–15.0)
MCH: 28.2 pg (ref 26.0–34.0)
MCHC: 32.3 g/dL (ref 30.0–36.0)
MCV: 87.1 fL (ref 78.0–100.0)
PLATELETS: 327 10*3/uL (ref 150–400)
RBC: 4.12 MIL/uL (ref 3.87–5.11)
RDW: 14.4 % (ref 11.5–15.5)
WBC: 8.3 10*3/uL (ref 4.0–10.5)

## 2017-04-22 LAB — BASIC METABOLIC PANEL
Anion gap: 9 (ref 5–15)
BUN: 20 mg/dL (ref 6–20)
CALCIUM: 9.1 mg/dL (ref 8.9–10.3)
CO2: 29 mmol/L (ref 22–32)
Chloride: 99 mmol/L — ABNORMAL LOW (ref 101–111)
Creatinine, Ser: 1.28 mg/dL — ABNORMAL HIGH (ref 0.44–1.00)
GFR calc Af Amer: 46 mL/min — ABNORMAL LOW (ref >60)
GFR, EST NON AFRICAN AMERICAN: 40 mL/min — AB (ref >60)
Glucose, Bld: 167 mg/dL — ABNORMAL HIGH (ref 65–99)
POTASSIUM: 4.1 mmol/L (ref 3.5–5.1)
SODIUM: 137 mmol/L (ref 135–145)

## 2017-04-22 LAB — I-STAT TROPONIN, ED: TROPONIN I, POC: 0 ng/mL (ref 0.00–0.08)

## 2017-04-22 NOTE — ED Triage Notes (Signed)
Reports feeling like heart was racing this evening and just feeling bad.  Denies having any pain.  Hx of afib on eliquis.  Hx of CHF.  EKG showing vent paced rhythm.

## 2017-04-23 ENCOUNTER — Encounter (HOSPITAL_COMMUNITY): Payer: Self-pay | Admitting: Physician Assistant

## 2017-04-23 ENCOUNTER — Emergency Department (HOSPITAL_COMMUNITY)
Admission: EM | Admit: 2017-04-23 | Discharge: 2017-04-23 | Disposition: A | Discharge status: Home / Self Care | Payer: Medicare Other | Attending: Emergency Medicine | Admitting: Emergency Medicine

## 2017-04-23 DIAGNOSIS — R0602 Shortness of breath: Secondary | ICD-10-CM | POA: Diagnosis not present

## 2017-04-23 DIAGNOSIS — I5032 Chronic diastolic (congestive) heart failure: Secondary | ICD-10-CM | POA: Diagnosis present

## 2017-04-23 DIAGNOSIS — I471 Supraventricular tachycardia: Secondary | ICD-10-CM | POA: Diagnosis not present

## 2017-04-23 DIAGNOSIS — Z950 Presence of cardiac pacemaker: Secondary | ICD-10-CM | POA: Diagnosis not present

## 2017-04-23 DIAGNOSIS — E118 Type 2 diabetes mellitus with unspecified complications: Secondary | ICD-10-CM | POA: Diagnosis present

## 2017-04-23 DIAGNOSIS — I4719 Other supraventricular tachycardia: Secondary | ICD-10-CM | POA: Diagnosis present

## 2017-04-23 DIAGNOSIS — R079 Chest pain, unspecified: Secondary | ICD-10-CM | POA: Diagnosis not present

## 2017-04-23 HISTORY — DX: Supraventricular tachycardia: I47.1

## 2017-04-23 HISTORY — DX: Other supraventricular tachycardia: I47.19

## 2017-04-23 HISTORY — DX: Presence of cardiac pacemaker: Z95.0

## 2017-04-23 LAB — I-STAT TROPONIN, ED: TROPONIN I, POC: 0 ng/mL (ref 0.00–0.08)

## 2017-04-23 LAB — TSH: TSH: 6.855 u[IU]/mL — ABNORMAL HIGH (ref 0.350–4.500)

## 2017-04-23 LAB — D-DIMER, QUANTITATIVE: D-Dimer, Quant: 0.32 ug/mL-FEU (ref 0.00–0.50)

## 2017-04-23 LAB — MAGNESIUM: Magnesium: 2 mg/dL (ref 1.7–2.4)

## 2017-04-23 MED ORDER — SODIUM CHLORIDE 0.9 % IV BOLUS (SEPSIS)
500.0000 mL | Freq: Once | INTRAVENOUS | Status: AC
  Administered 2017-04-23: 500 mL via INTRAVENOUS

## 2017-04-23 MED ORDER — SODIUM CHLORIDE 0.9 % IV BOLUS (SEPSIS): 1000.0000 mL | Freq: Once | INTRAVENOUS | Status: DC

## 2017-04-23 NOTE — ED Provider Notes (Signed)
[facility] DEPT Provider Note   CSN: [id number] Arrival date & time: 04/22/17  2114     History   Chief Complaint Chief Complaint  Patient presents with  . Tachycardia    HPI Mary [name] is a 76 y.o. female.  HPI Mary [name] is a 76 y.o. female with history of A. fib, CHF, coronary disease, diabetes, presents to emergency department complaining of elevated heart rate. Patient states that she felt fatigued this evening, states did not have appetite during dinner, when she came home she checked her blood pressure and heart rate and noticed that her blood pressure was on the lower side, with 90 being systolic reading in that her heart rate was 120. She states that her heart rate was not coming down so she decided to come to emergency department. Patient states she has history of A. fib and a pacemaker. She denies feeling any chest pain or shortness of breath. Denies any swelling. No nausea or vomiting. No other associated symptoms.  Past Medical History:  Diagnosis Date  . Arthritis   . CAD (coronary artery disease)    non obstructive  . Carotid stenosis    Carotid US 5/16:  Bilateral ICA 1-39%; L vertebral retrograde; L BP 126/49, R BP 140/57  . CHF (congestive heart failure) ([place])   . Chronic lower back pain   . Colon cancer ([place])    a. s/p chemo  . Colon polyps   . Diabetic peripheral neuropathy ([place])   . DVT (deep venous thrombosis) ([place]) 2013   "twice behind knee on left side" (06/25/2013)  . Glaucoma   . Hyperlipidemia   . Hypertension   . Hypothyroidism   . Iron deficiency anemia   . Multinodular goiter   . Sleep apnea    a. resolved post weight loss   . Type II diabetes mellitus [facility])     Patient Active Problem List   Diagnosis Date Noted  . Second degree AV block, Mobitz type II   . Depression 08/02/2016  . Chronic diastolic heart failure ([place]) 08/02/2016  . DVT (deep vein thrombosis) in pregnancy ([place]) 08/02/2016  . Tobacco abuse 08/02/2016  .  Chronic venous insufficiency 02/21/2015  . 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 Dizziness 01/27/2015  . Cardiac pacemaker in situ 05/13/2014  . Atrioventricular block, complete ([place]) 01/02/2014  . PAF (paroxysmal atrial fibrillation) ([place]) 08/29/2013  . Obesity 05/05/2013  . CAD (coronary artery disease)   . Diabetes mellitus with complication ([place])   . Anemia   . Hypothyroidism   . Hypertension   . Colon cancer ([place])   . Hyperlipidemia     Past Surgical History:  Procedure Laterality Date  . CARDIAC CATHETERIZATION  2006   Never had PCI, 3 caths total. Last one in [place]  . CARDIAC CATHETERIZATION N/A 02/04/2015   Procedure: Left Heart Cath and Coronary Angiography;  Surgeon: Belva Crome, MD;  Location: [place] CV LAB;  Service: Cardiovascular;  Laterality: N/A;  . CATARACT EXTRACTION, BILATERAL Bilateral 2013   "and put stent in my left eye for glaucoma" (06/25/2013)  . CHOLECYSTECTOMY  1980's  . COLECTOMY  10/2010   Tumor removal  . EYE MUSCLE SURGERY Bilateral ~ 1963   "muscles too long; eyes would go out and up; tied muscles to hold my eyes straight" (06/25/2013)  . LEAD REVISION  01-03-2014   atrial lead revision by Dr Rayann Heman  . LEAD REVISION N/A 01/03/2014   Procedure: LEAD REVISION;  Surgeon: Jeneen Rinks  D Allred, MD;  Location: [facility] CATH LAB;  Service: Cardiovascular;  Laterality: N/A;  . PACEMAKER INSERTION  01/02/2014   STJ Assurity dual chamber pacemaker implnated by Dr Lovena Le for SSS  . PERMANENT PACEMAKER INSERTION N/A 01/02/2014   Procedure: PERMANENT PACEMAKER INSERTION;  Surgeon: Evans Lance, MD;  Location: [facility] CATH LAB;  Service: Cardiovascular;  Laterality: N/A;  . PORTACATH PLACEMENT Left 11/2010  . ROUX-EN-Y GASTRIC BYPASS  2009  . ULNAR TUNNEL RELEASE Left ~ 2008  . UMBILICAL HERNIA REPAIR  1970's?    (06/25/2013)    OB History    No data available       Home Medications    Prior to Admission medications   Medication Sig Start Date End Date Taking?  Authorizing Provider  atorvastatin (LIPITOR) 80 MG tablet TAKE 1 TABLET BY MOUTH ONCE DAILY Patient taking differently: TAKE 80 MG BY MOUTH AT BEDTIME 03/21/17  Yes Calone, Ples Specter, FNP  bimatoprost (LUMIGAN) 0.01 % SOLN Place 1 drop into the right eye at bedtime.    Yes [provider]  buPROPion (WELLBUTRIN XL) 150 MG 24 hr tablet take 1 tablet by mouth daily 11/02/16  Yes Hoyt Koch, MD  ELIQUIS 5 MG TABS tablet TAKE 1 TABLET BY MOUTH TWICE A DAY 04/20/17  Yes Allred, Jeneen Rinks, MD  furosemide (LASIX) 80 MG tablet Take 1 tablet (80 mg total) by mouth daily. 10/12/16  Yes Eileen Stanford, PA-C  gabapentin (NEURONTIN) 300 MG capsule Take 300 mg by mouth at bedtime.    Yes [provider]  HYDROcodone-acetaminophen (NORCO/VICODIN) 5-325 MG tablet Take 1 tablet by mouth every 6 (six) hours as needed for moderate pain. 07/09/16  Yes Hoyt Koch, MD  insulin glargine (LANTUS) 100 unit/mL SOPN Inject 36 Units into the skin at bedtime.    Yes [provider]  levothyroxine (SYNTHROID, LEVOTHROID) 112 MCG tablet Take 112 mcg by mouth daily before breakfast.   Yes [provider]  nitroGLYCERIN (NITROSTAT) 0.4 MG SL tablet Place 1 tablet (0.4 mg total) under the tongue every 5 (five) minutes as needed for chest pain. 01/13/15  Yes Allred, Jeneen Rinks, MD  potassium chloride SA (K-DUR,KLOR-CON) 20 MEQ tablet Take 2 tablets (40 mEq total) by mouth once. Patient taking differently: Take 40 mEq by mouth at bedtime.  08/11/16 04/23/17 Yes Eileen Stanford, PA-C  losartan (COZAAR) 25 MG tablet Take 1 tablet (25 mg total) by mouth daily. 03/30/17 06/28/17  [name], MD    Family History Family History  Problem Relation Age of Onset  . Breast cancer Mother   . Diabetes Mother   . Heart disease Father   . Heart attack Father   . Heart attack Sister   . Heart attack Brother   . Breast cancer Sister   . Breast cancer Maternal Aunt        x 2 aunts  .  Pancreatic cancer Brother   . Prostate cancer Brother   . Colon polyps Brother   . Bladder Cancer Cousin   . Clotting disorder Sister   . Diabetes Sister        x 3  . Diabetes Maternal Grandmother     Social History Social History  Substance Use Topics  . Smoking status: Light Tobacco Smoker    Packs/day: 1.00    Years: 30.00    Types: Cigarettes    Last attempt to quit: 05/03/2013  . Smokeless tobacco: Never Used     Comment: 06/25/2013 "smoked  off and on since I was 22; stopped 10/2007 til 08/30/2012 then started again cause of stress"  . Alcohol use 0.0 oz/week     Comment: 06/25/2013 "glass of wine 1-2 times/yr"     Allergies   Other; Adhesive [tape]; Aspirin; Latex; and Nsaids   Review of Systems Review of Systems  Constitutional: Positive for fatigue. Negative for chills and fever.  Respiratory: Negative for cough, chest tightness and shortness of breath.   Cardiovascular: Negative for chest pain, palpitations and leg swelling.  Gastrointestinal: Negative for abdominal pain, diarrhea, nausea and vomiting.  Genitourinary: Negative for dysuria, flank pain, pelvic pain, vaginal bleeding, vaginal discharge and vaginal pain.  Musculoskeletal: Negative for arthralgias, myalgias, neck pain and neck stiffness.  Skin: Negative for rash.  Neurological: Positive for weakness. Negative for dizziness and headaches.  All other systems reviewed and are negative.    Physical Exam Updated Vital Signs BP 140/86   Pulse (!) 119   Temp 98.4 F (36.9 C) (Oral)   Resp 18   Ht 5\' 7"  (1.702 m)   Wt 111.6 kg (246 lb)   SpO2 95%   BMI 38.53 kg/m   Physical Exam  Constitutional: She appears well-developed and well-nourished. No distress.  HENT:  Head: Normocephalic.  Eyes: Conjunctivae are normal.  Neck: Neck supple.  Cardiovascular: Normal heart sounds.  An irregular rhythm present. Tachycardia present.   Pulmonary/Chest: Effort normal and breath sounds normal. No respiratory  distress. She has no wheezes. She has no rales.  Abdominal: Soft. Bowel sounds are normal. She exhibits no distension. There is no tenderness. There is no rebound.  Musculoskeletal: She exhibits no edema.  Neurological: She is alert.  Skin: Skin is warm and dry.  Psychiatric: She has a normal mood and affect. Her behavior is normal.  Nursing note and vitals reviewed.    ED Treatments / Results  Labs (all labs ordered are listed, but only abnormal results are displayed) Labs Reviewed  BASIC METABOLIC PANEL - Abnormal; Notable for the following:       Result Value   Chloride 99 (*)    Glucose, Bld 167 (*)    Creatinine, Ser 1.28 (*)    GFR calc non Af Amer 40 (*)    GFR calc Af Amer 46 (*)    All other components within normal limits  CBC - Abnormal; Notable for the following:    Hemoglobin 11.6 (*)    HCT 35.9 (*)    All other components within normal limits  I-STAT TROPONIN, ED    EKG  EKG Interpretation  Date/Time:  Friday April 22 2017 21:22:53 EDT Ventricular Rate:  100 PR Interval:    QRS Duration: 166 QT Interval:  456 QTC Calculation: 588 R Axis:   -79 Text Interpretation:  Ventricular-paced rhythm Abnormal ECG Confirmed by Pryor Curia [phone]) on 04/23/2017 5:10:41 AM       Radiology Dg Chest 2 View  Result Date: 04/22/2017 CLINICAL DATA:  Tachycardia. EXAM: CHEST  2 VIEW COMPARISON:  08/01/2016 FINDINGS: Left-sided pacemaker remains in place. The cardiomediastinal contours are normal. Aortic arch atherosclerosis again seen. The lungs are clear. Pulmonary vasculature is normal. No consolidation, pleural effusion, or pneumothorax. No acute osseous abnormalities are seen. Surgical clips in the upper abdomen. IMPRESSION: No acute abnormality. Thoracic aortic atherosclerosis. Electronically Signed   By: Jeb Levering M.D.   On: 04/22/2017 22:30    Procedures Procedures (including critical care time)  Medications Ordered in ED Medications - No data to  display   Initial Impression / Assessment and Plan / ED Course  I have reviewed the triage vital signs and the nursing notes.  Pertinent labs & imaging results that were available during my care of the patient were reviewed by me and considered in my medical decision making (see chart for details).     Patient's emergency department with elevated heart rate, appears to be paced on EKG. Labs unremarkable, obtained in triage. Will interrogate the pacemaker.   6:42 AM Pacemaker just now interrogated with normal device function, AMS/presenting ventricular rate of 115 to 150. I will call cardiology.  I spoke with cardiology, they will consult in the morning. We'll monitor department. Delta troponin ordered. Dr. Leonides Schanz has seen the patient as well, will add d-dimer, TSH, magnesium.  Patient signed out at shift change pending cardiology consult and labs.  Vitals:   04/23/17 0500 04/23/17 0600 04/23/17 0630 04/23/17 0700  BP: (!) 143/89 124/86 121/82 116/77  Pulse: (!) 113 (!) 117 (!) 114 (!) 114  Resp:    19  Temp:      TempSrc:      SpO2: 95% 94% 95% 95%  Weight:      Height:           Final Clinical Impressions(s) / ED Diagnoses   Final diagnoses:  None    New Prescriptions New Prescriptions   No medications on file     Jeannett Senior, PA-C 04/23/17 6195

## 2017-04-23 NOTE — ED Notes (Signed)
Pacemaker interrogated. 

## 2017-04-23 NOTE — Progress Notes (Signed)
Patient ID: Mary [name], female   DOB: 1940-09-04, 76 y.o.   MRN: [id number]  EP procedure note  Procedure performed: Pace termination of atrial tachycardia  Description of the procedure: Through the patient's pacemaker, atrial pacing was carried out initially at 130 bpm, and was gradually increased up to 220 bpm, ultimately terminating the patient's atrial tachycardia in restoring sinus rhythm. There were no complications.  Conclusion: Successful pace termination of atrial tachycardia with anti-tachycardic pacing delivered to the patient's St. Jude dual-chamber pacemaker  Disposition: The patient maintained sinus rhythm for an hour, she can be discharged home. If she reverts back to atrial tachycardia, she will be admitted for antiarrhythmic drug therapy.  Cristopher Peru, M.D.

## 2017-04-23 NOTE — ED Notes (Signed)
Copy of st Judes results placed in medical records bin with patient label.

## 2017-04-23 NOTE — Discharge Instructions (Signed)
Please follow with your primary care doctor in the next 2 days for a check-up. They must obtain records for further management.  ° °Do not hesitate to return to the Emergency Department for any new, worsening or concerning symptoms.  ° °

## 2017-04-23 NOTE — Consult Note (Signed)
Cardiology Consultation:   Patient ID: Mary [name]; [id number]; 15-Feb-1941   Admit date: 04/23/2017 Date of Consult: 04/23/2017  Primary Care Provider: Hoyt Koch, MD Primary Cardiologist: Dr. [name] Primary Electrophysiologist:  Dr. Lovena Le   Patient Profile:   Mary [name] is a 76 y.o. female with a hx of Complete heart block who is being seen today for the evaluation of atrial tachycardia with rapid ventricular response at the request of Dr. Zenia Resides.  History of Present Illness:   Mary [name] is a pleasant 76 year old woman with nonobstructive coronary disease, complete heart block, status post pacemaker insertion in 2015. The patient has been stable but noted palpitations beginning yesterday. Her blood pressure was on the low side. She was a little short of breath. She presented to the emergency room where she was noted to be in a ventricular paced rhythm at 120 bpm. She is referred for additional evaluation. She denies medical and dietary noncompliance. No new peripheral edema. She has chronic class II dyspnea. She has a history of atrial tachycardia in the past.  Past Medical History:  Diagnosis Date  . Arthritis   . Atrial tachycardia ([place]) 04/23/2017  . CAD (coronary artery disease)    non obstructive  . 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 Procedure complicated by an atrial lead dislodgment and repeat procedure, 12/2013   . Carotid stenosis    Carotid US 5/16:  Bilateral ICA 1-39%; L vertebral retrograde; L BP 126/49, R BP 140/57  . CHF (congestive heart failure) ([place])   . Chronic lower back pain   . Colon cancer ([place])    a. s/p chemo  . Colon polyps   . Diabetic peripheral neuropathy ([place])   . DVT (deep venous thrombosis) ([place]) 2013   "twice behind knee on left side" (06/25/2013)  . Glaucoma   . Hyperlipidemia   . Hypertension   . Hypothyroidism   . Iron deficiency anemia   . Multinodular goiter    . Sleep apnea    a. resolved post weight loss   . Type II diabetes mellitus ([place])     Past Surgical History:  Procedure Laterality Date  . CARDIAC CATHETERIZATION  2006   Never had PCI, 3 caths total. Last one in [place]  . CARDIAC CATHETERIZATION N/A 02/04/2015   Procedure: Left Heart Cath and Coronary Angiography;  Surgeon: Belva Crome, MD;  Location: [place] CV LAB;  Service: Cardiovascular;  Laterality: N/A;  . CATARACT EXTRACTION, BILATERAL Bilateral 2013   "and put stent in my left eye for glaucoma" (06/25/2013)  . CHOLECYSTECTOMY  1980's  . COLECTOMY  10/2010   Tumor removal  . EYE MUSCLE SURGERY Bilateral ~ 1963   "muscles too long; eyes would go out and up; tied muscles to hold my eyes straight" (06/25/2013)  . LEAD REVISION  01-03-2014   atrial lead revision by Dr Rayann Heman  . LEAD REVISION N/A 01/03/2014   Procedure: LEAD REVISION;  Surgeon: Coralyn Mark, MD;  Location: [facility] CATH LAB;  Service: Cardiovascular;  Laterality: N/A;  . PACEMAKER INSERTION  01/02/2014   STJ Assurity dual chamber pacemaker implnated by Dr Lovena Le for SSS  . PERMANENT PACEMAKER INSERTION N/A 01/02/2014   Procedure: PERMANENT PACEMAKER INSERTION;  Surgeon: Evans Lance, MD;  Location: [facility] CATH LAB;  Service: Cardiovascular;  Laterality: N/A;  . PORTACATH PLACEMENT Left 11/2010  . ROUX-EN-Y GASTRIC BYPASS  2009  . ULNAR TUNNEL RELEASE Left ~  9622  . UMBILICAL HERNIA REPAIR  1970's?    (06/25/2013)       Inpatient Medications: Scheduled Meds:  Continuous Infusions:  PRN Meds:   Allergies:    Allergies  Allergen Reactions  . Other Other (See Comments)    NO Blind Scopes with Naso Gastric tube.  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 Adhesive [Tape]     Tape - burns - pls use paper tape or elastic bandage  . Aspirin Other (See Comments)    S/P gastric bypass surgery, states her MD told her to not take Aspirin.  . Latex     Tape= burns  . Nsaids Other (See Comments)    S/P gastric  bypass-told not to take. GI bleeds with NSAIDS    Social History:   Social History   Social History  . Marital status: Married    Spouse name: [name]  . Number of children: 0  . Years of education: N/A   Occupational History  . Retired     office work   Social History Main Topics  . Smoking status: Light Tobacco Smoker    Packs/day: 1.00    Years: 30.00    Types: Cigarettes    Last attempt to quit: 05/03/2013  . Smokeless tobacco: Never Used     Comment: 06/25/2013 "smoked off and on since I was 22; stopped 10/2007 til 08/30/2012 then started again cause of stress"  . Alcohol use 0.0 oz/week     Comment: 06/25/2013 "glass of wine 1-2 times/yr"  . Drug use: No  . Sexual activity: Yes   Other Topics Concern  . Not on file   Social History Narrative   Married to husband, [name]   No children   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   Recently moved back here from [place]     Family History:    Family History  Problem Relation Age of Onset  . Breast cancer Mother   . Diabetes Mother   . Heart disease Father   . Heart attack Father   . Heart attack Sister   . Heart attack Brother   . Breast cancer Sister   . Breast cancer Maternal Aunt        x 2 aunts  . Pancreatic cancer Brother   . Prostate cancer Brother   . Colon polyps Brother   . Bladder Cancer Cousin   . Clotting disorder Sister   . Diabetes Sister        x 3  . Diabetes Maternal Grandmother      ROS:  Please see the history of present illness.  ROS  All other ROS reviewed and negative.     Physical Exam/Data:   Vitals:   04/23/17 0815 04/23/17 0830 04/23/17 0845 04/23/17 0900  BP:  115/83  105/76  Pulse: (!) 118 (!) 119 (!) 118 (!) 118  Resp: 16 (!) 23 (!) 26 (!) 26  Temp:      TempSrc:      SpO2:  98%  97%  Weight:      Height:        Intake/Output Summary (Last 24 hours) at 04/23/17 1136 Last data filed at 04/23/17 0955  Gross per 24 hour  Intake               500 ml  Output                0 ml  Net  500 ml   Filed Weights   04/22/17 2124  Weight: 246 lb (111.6 kg)   Body mass index is 38.53 kg/m.  General:  Well nourished, well developed, in no acute distress HEENT: normal Lymph: no adenopathy Neck: no JVD Endocrine:  No thryomegaly Vascular: No carotid bruits; FA pulses 2+ bilaterally without bruits  Cardiac:  normal S1, S2; Regular tachycardia; grade 1/6 systolic murmur  Lungs:  clear to auscultation bilaterally, no wheezing, rhonchi or rales  Abd: soft, nontender, no hepatomegaly  Ext: no edema Musculoskeletal:  No deformities, BUE and BLE strength normal and equal Skin: warm and dry  Neuro:  CNs 2-12 intact, no focal abnormalities noted Psych:  Normal affect   EKG:  The EKG was personally reviewed and demonstrates:  Atrial tachycardia with ventricular pacing Telemetry:  Telemetry was personally reviewed and demonstrates:  Atrial tachycardia with ventricular pacing  Relevant CV Studies: Pacemaker interrogation carried out under my direct supervision demonstrates atrial tachycardia with ventricular pacing and intermittent AV block.  Laboratory Data:  Chemistry Recent Labs Lab 04/22/17 2132  NA 137  K 4.1  CL 99*  CO2 29  GLUCOSE 167*  BUN 20  CREATININE 1.28*  CALCIUM 9.1  GFRNONAA 40*  GFRAA 46*  ANIONGAP 9    No results for input(s): PROT, ALBUMIN, AST, ALT, ALKPHOS, BILITOT in the last 168 hours. Hematology Recent Labs Lab 04/22/17 2132  WBC 8.3  RBC 4.12  HGB 11.6*  HCT 35.9*  MCV 87.1  MCH 28.2  MCHC 32.3  RDW 14.4  PLT 327   Cardiac EnzymesNo results for input(s): TROPONINI in the last 168 hours.  Recent Labs Lab 04/22/17 2148 04/23/17 0742  TROPIPOC 0.00 0.00    BNPNo results for input(s): BNP, PROBNP in the last 168 hours.  DDimer  Recent Labs Lab 04/23/17 0736  DDIMER 0.32    Radiology/Studies:  Dg Chest 2 View  Result Date: 04/22/2017 CLINICAL DATA:  Tachycardia.  EXAM: CHEST  2 VIEW COMPARISON:  08/01/2016 FINDINGS: Left-sided pacemaker remains in place. The cardiomediastinal contours are normal. Aortic arch atherosclerosis again seen. The lungs are clear. Pulmonary vasculature is normal. No consolidation, pleural effusion, or pneumothorax. No acute osseous abnormalities are seen. Surgical clips in the upper abdomen. IMPRESSION: No acute abnormality. Thoracic aortic atherosclerosis. Electronically Signed   By: Jeb Levering M.D.   On: 04/22/2017 22:30    Assessment and Plan:   1. Atrial tachycardia with ventricular pacing at the upper rate 2. Complete heart block status post permanent pacemaker insertion 3. Hypertension Discussion: I will attempt to pace terminate the patient's atrial tachycardia with anti-tachycardic pacing delivered to her pacemaker.   Signed, Cristopher Peru, MD  04/23/2017 11:36 AM

## 2017-04-23 NOTE — ED Provider Notes (Signed)
PROGRESS NOTE                                                                                                                 This is a sign-out from [place] at shift change: Mary [name] is a 76 y.o. female presenting with palpitations. EKG with paced sinus tachycardia versus A. Fib. Plan is to obtain delta troponin, TSH, d-dimer, magnesium and cardiology will be in to evaluate. Please refer to previous note for full HPI, ROS, PMH and PE.   Repeat troponin negative, dimer negative, magnesium normal. TSH is elevated at 6.8. Patient has known hypothyroidism, patient advised to follow with primary care for recheck.  Dr. Lovena Le has evaluated the patient and will attempt to terminate the patient's atrial tachycardia with pacing via pacemaker.  This has been successful, he would like the patient to rest for one hour and then attempt ambulation. Patient ambulated she was comfortable with a heart rate of 86. Patient discharged with follow-up suggested with cardiology.  Advised at discharge at the BNP had hemolyzed and not been run. Discussed with attending physician, Dr. Zenia Resides who agrees that since she is not having any increasing peripheral edema or shortness of breath above her baseline we can DC this test, patient feels comfortable going home.   [name] 04/23/17 1338    Lacretia Leigh, MD 04/29/17 1414

## 2017-04-23 NOTE — ED Notes (Signed)
Dr Lovena Le at bedside.   Rhythm converted to nsr.  MD stated to have pt stay in bed x 1 hour, then ambulate.  If hr increases above 100, call PA Rhonda.

## 2017-04-23 NOTE — ED Notes (Signed)
Cadio MD at bedside.

## 2017-04-23 NOTE — ED Provider Notes (Signed)
Medical screening examination/treatment/procedure(s) were conducted as a shared visit with non-physician practitioner(s) and myself.  I personally evaluated the patient during the encounter.   EKG Interpretation  Date/Time:  Friday April 22 2017 21:22:53 EDT Ventricular Rate:  100 PR Interval:    QRS Duration: 166 QT Interval:  456 QTC Calculation: 588 R Axis:   -79 Text Interpretation:  Ventricular-paced rhythm Abnormal ECG Confirmed by Pryor Curia [phone]) on 04/23/2017 5:10:41 AM      Patient is a 76 year old female with history of DVT on Eliquis, symptomatic bradycardia status post pacemaker, diastolic heart failure who presents emergency department with fatigue. States she felt tired all day yesterday and checked her blood pressure and noted that her heart rate was very elevated. She initially denied any chest pain or shortness of breath but states she is having some mild chest discomfort but describes it as feeling like "indigestion". No recent fevers, cough, vomiting, diarrhea. She states she feels she may be dehydrated because she has been constipated.  Patient has normal electrolytes. 2 negative troponins. Negative d-dimer. Cardiology has seen patient and states that she is having atrial tachycardia. Pacemaker adjusted and tachycardia resolved. Patient monitored in the ED for 1 hour and discharged home successfully.   Ward, Delice Bison, DO 04/23/17 2304

## 2017-04-23 NOTE — H&P (Signed)
Cardiology Consultation:   Patient ID: Mary [name]; [id number]; [birth year]   Admit date: 04/23/2017 Date of Consult: 04/23/2017  Primary Care Provider: Hoyt Koch, MD Primary Cardiologist: Dr Mary [name] 11/10/2016 Primary Electrophysiologist:  Dr Mary [name], 03/30/2017   Patient Profile:   Mary [name] is a 76 y.o. female with a hx of  chronic diastolic CHF, tachy-brady syndrome s/p PPM (St. Jude), PAF on Eliquis, DVT ( on Eliquis), HTN, HLD, DM, OSA, non obstructive by cath (01/2015) who is being seen today for the evaluation of PAF at the request of Mary [name].  History of Present Illness:   Mary [name] has been feeling tired for several weeks but worse over the last few days. She has been too tired to do anything. She is exhausted by taking a shower. Last pm, she went out to dinner, but slept all the way there and back again. She did not enjoy herself. After she got home, she took her BP and SBP was in the 90s. HR was over 110.   They came to the ER about 9 pm. She was in atrial fib, 100-120. She is V pacing.   She has been tired, increased DOE, +LE edema which is helped by compression stockings. She has lost weight recently. Her weight goes up and down. She has been constipated.  She was recently taken off metformin and her sugars have been running high. She does not stick tightly to a diabetic diet.   She is having chest discomfort regularly. She may get it with exertion. She will also get it at night at times. She does not take any rx for it.   She is having the CP now. It is a 4-5/10. When she was coming into the hospital in November, the level was 10/10, but she was in heart failure at the time.     Past Medical History:  Diagnosis Date  . Arthritis   . Atrial tachycardia ([place]) 04/23/2017  . CAD (coronary artery disease)    non obstructive  . 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 Procedure complicated by  an atrial lead dislodgment and repeat procedure, 12/2013   . Carotid stenosis    Carotid US 5/16:  Bilateral ICA 1-39%; L vertebral retrograde; L BP 126/49, R BP 140/57  . CHF (congestive heart failure) ([place])   . Chronic lower back pain   . Colon cancer ([place])    a. s/p chemo  . Colon polyps   . Diabetic peripheral neuropathy ([place])   . DVT (deep venous thrombosis) ([place]) 2013   "twice behind knee on left side" (06/25/2013)  . Glaucoma   . Hyperlipidemia   . Hypertension   . Hypothyroidism   . Iron deficiency anemia   . Multinodular goiter   . Sleep apnea    a. resolved post weight loss   . Type II diabetes mellitus ([place])     Past Surgical History:  Procedure Laterality Date  . CARDIAC CATHETERIZATION  2006   Never had PCI, 3 caths total. Last one in [place]  . CARDIAC CATHETERIZATION N/A 02/04/2015   Procedure: Left Heart Cath and Coronary Angiography;  Surgeon: Belva Crome, MD;  Location: [facility];  Service: Cardiovascular;  Laterality: N/A;  . CATARACT EXTRACTION, BILATERAL Bilateral 2013   "and put stent in my left eye for glaucoma" (06/25/2013)  . CHOLECYSTECTOMY  1980's  . COLECTOMY  10/2010   Tumor removal  .  EYE MUSCLE SURGERY Bilateral ~ 1963   "muscles too long; eyes would go out and up; tied muscles to hold my eyes straight" (06/25/2013)  . LEAD REVISION  01-03-2014   atrial lead revision by Dr Mary [name]  . LEAD REVISION N/A 01/03/2014   Procedure: LEAD REVISION;  Surgeon: Coralyn Mark, MD;  Location: [facility];  Service: Cardiovascular;  Laterality: N/A;  . PACEMAKER INSERTION  01/02/2014   STJ Assurity dual chamber pacemaker implnated by Dr Lovena Le for SSS  . PERMANENT PACEMAKER INSERTION N/A 01/02/2014   Procedure: PERMANENT PACEMAKER INSERTION;  Surgeon: Evans Lance, MD;  Location: [facility] C[facility];  Service: Cardiovascular;  Laterality: N/A;  . PORTACATH PLACEMENT Left 11/2010  . ROUX-EN-Y GASTRIC BYPASS  2009  . ULNAR TUNNEL RELEASE Left ~ 2008  . UMBILICAL  HERNIA REPAIR  1970's?    (06/25/2013)     Inpatient Medications: Scheduled Meds:  Continuous Infusions: . sodium chloride 500 mL (04/23/17 0855)   PRN Meds:  Prior to Admission medications   Medication Sig Start Date End Date Taking? Authorizing Provider  atorvastatin (LIPITOR) 80 MG tablet TAKE 1 TABLET BY MOUTH ONCE DAILY Patient taking differently: TAKE 80 MG BY MOUTH AT BEDTIME 03/21/17  Yes Calone, Ples Specter, FNP  bimatoprost (LUMIGAN) 0.01 % SOLN Place 1 drop into the right eye at bedtime.    Yes [provider]  buPROPion (WELLBUTRIN XL) 150 MG 24 hr tablet take 1 tablet by mouth daily 11/02/16  Yes Mary Koch, MD  ELIQUIS 5 MG TABS tablet TAKE 1 TABLET BY MOUTH TWICE A DAY 04/20/17  Yes Allred, Jeneen Rinks, MD  furosemide (LASIX) 80 MG tablet Take 1 tablet (80 mg total) by mouth daily. 10/12/16  Yes Eileen Stanford, PA-C  gabapentin (NEURONTIN) 300 MG capsule Take 300 mg by mouth at bedtime.    Yes [provider]  HYDROcodone-acetaminophen (NORCO/VICODIN) 5-325 MG tablet Take 1 tablet by mouth every 6 (six) hours as needed for moderate pain. 07/09/16  Yes Mary Koch, MD  insulin glargine (LANTUS) 100 unit/mL SOPN Inject 36 Units into the skin at bedtime.    Yes [provider]  levothyroxine (SYNTHROID, LEVOTHROID) 112 MCG tablet Take 112 mcg by mouth daily before breakfast.   Yes [provider]  nitroGLYCERIN (NITROSTAT) 0.4 MG SL tablet Place 1 tablet (0.4 mg total) under the tongue every 5 (five) minutes as needed for chest pain. 01/13/15  Yes Allred, Jeneen Rinks, MD  potassium chloride SA (K-DUR,KLOR-CON) 20 MEQ tablet Take 2 tablets (40 mEq total) by mouth once. Patient taking differently: Take 40 mEq by mouth at bedtime.  08/11/16 04/23/17 Yes Eileen Stanford, PA-C  losartan (COZAAR) 25 MG tablet Take 1 tablet (25 mg total) by mouth daily. 03/30/17 06/28/17  [name], MD    Allergies:    Allergies  Allergen Reactions    . Other Other (See Comments)    NO Blind Scopes with Naso Gastric tube.  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 Adhesive [Tape]     Tape - burns - pls use paper tape or elastic bandage  . Aspirin Other (See Comments)    S/P gastric bypass surgery, states her MD told her to not take Aspirin.  . Latex     Tape= burns  . Nsaids Other (See Comments)    S/P gastric bypass-told not to take. GI bleeds with NSAIDS    Social History:   Social History   Social History  . Marital  status: Married    Spouse name: [name]  . Number of children: 0  . Years of education: N/A   Occupational History  . Retired     office work   Social History Main Topics  . Smoking status: Light Tobacco Smoker    Packs/day: 1.00    Years: 30.00    Types: Cigarettes    Last attempt to quit: 05/03/2013  . Smokeless tobacco: Never Used     Comment: 06/25/2013 "smoked off and on since I was 22; stopped 10/2007 til 08/30/2012 then started again cause of stress"  . Alcohol use 0.0 oz/week     Comment: 06/25/2013 "glass of wine 1-2 times/yr"  . Drug use: No  . Sexual activity: Yes   Other Topics Concern  . Not on file   Social History Narrative   Married to husband, [name]   No children   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   Recently moved back here from [place]     Family History:   The patient's family history includes Bladder Cancer in her cousin; Breast cancer in her maternal aunt, mother, and sister; Clotting disorder in her sister; Colon polyps in her brother; Diabetes in her maternal grandmother, mother, and sister; Heart attack in her brother, father, and sister; Heart disease in her father; Pancreatic cancer in her brother; Prostate cancer in her brother. Pt indicated that her mother is deceased. She indicated that her father is deceased. She indicated that two of her four sisters are deceased. She indicated that only one of her four brothers is alive. She indicated that her maternal  grandmother is deceased. She indicated that her maternal grandfather is deceased. She indicated that her paternal grandmother is deceased. She indicated that her paternal grandfather is deceased. She indicated that the status of her maternal aunt is unknown. She indicated that the status of her cousin is unknown.    ROS:  Please see the history of present illness.  All other ROS reviewed and negative.      Physical Exam/Data:   Vitals:   04/23/17 0815 04/23/17 0830 04/23/17 0845 04/23/17 0900  BP:  115/83  105/76  Pulse: (!) 118 (!) 119 (!) 118 (!) 118  Resp: 16 (!) 23 (!) 26 (!) 26  Temp:      TempSrc:      SpO2:  98%  97%  Weight:      Height:       No intake or output data in the 24 hours ending 04/23/17 0916 Filed Weights   04/22/17 2124  Weight: 246 lb (111.6 kg)   Body mass index is 38.53 kg/m.  General:  Well nourished, well developed, in no acute distress HEENT: normal Lymph: no adenopathy Neck: minimal JVD Endocrine:  No thryomegaly Vascular: No carotid bruits; 4/4 extremity pulses 2+ bilaterally without bruits  Cardiac:  normal S1, S2; RRR; no murmur  Lungs:  clear to auscultation bilaterally, no wheezing, rhonchi or rales  Abd: soft, nontender, no hepatomegaly  Ext: no edema Musculoskeletal:  No deformities, BUE and BLE strength normal and equal Skin: warm and dry  Neuro:  CNs 2-12 intact, no focal abnormalities noted Psych:  Normal affect   EKG:  The EKG was personally reviewed and demonstrates:  Atrial tach with V pacing (also reviewed by Dr Lovena Le) Telemetry:  Telemetry was personally reviewed and demonstrates:  Atrial tach, v pacing at 118 bpm  Relevant CV Studies: ECHO: 121/11/2015 - Left ventricle: The cavity  size was normal. Wall thickness was   increased in a pattern of mild LVH. Systolic function was normal.   The estimated ejection fraction was in the range of 60% to 65%.   Wall motion was normal; there were no regional wall motion    abnormalities. - Mitral valve: Mildly to moderately calcified annulus. - Left atrium: The atrium was mildly dilated. - Right atrium: The atrium was mildly dilated. - Pulmonary arteries: Systolic pressure was mildly increased. PA   peak pressure: 36 mm Hg (S).  CATH: 02/04/2015 1. Mid RCA lesion, 40% stenosed. 2. 1st RPLB lesion, 90% stenosed. 3. 2nd Mrg lesion, 35% stenosed. 4. Prox LAD lesion, 50% stenosed. 5. Dist LAD lesion, 80% stenosed.  Diffuse, intramyocardial distal LAD with up to 80% stenosis is noted.  The mid RCA, proximal LAD, and second obtuse marginal contains less than 50% narrowing.  The small PDA/posterolateral branch contains ostial 80-90% narrowing  Normal left ventricular function with EF estimated at 60% RECOMMENDATIONS: Medical management. No lesions are suitable for PCI. Chest discomfort should be treated with nitroglycerin. Aggressive risk factor modification.  Laboratory Data:  Chemistry  Recent Labs Lab 04/22/17 2132  NA 137  K 4.1  CL 99*  CO2 29  GLUCOSE 167*  BUN 20  CREATININE 1.28*  CALCIUM 9.1  GFRNONAA 40*  GFRAA 46*  ANIONGAP 9    Hematology  Recent Labs Lab 04/22/17 2132  WBC 8.3  RBC 4.12  HGB 11.6*  HCT 35.9*  MCV 87.1  MCH 28.2  MCHC 32.3  RDW 14.4  PLT 327   Cardiac Enzymes   Recent Labs Lab 04/22/17 2148 04/23/17 0742  TROPIPOC 0.00 0.00    Radiology/Studies:  Dg Chest 2 View  Result Date: 04/22/2017 CLINICAL DATA:  Tachycardia. EXAM: CHEST  2 VIEW COMPARISON:  08/01/2016 FINDINGS: Left-sided pacemaker remains in place. The cardiomediastinal contours are normal. Aortic arch atherosclerosis again seen. The lungs are clear. Pulmonary vasculature is normal. No consolidation, pleural effusion, or pneumothorax. No acute osseous abnormalities are seen. Surgical clips in the upper abdomen. IMPRESSION: No acute abnormality. Thoracic aortic atherosclerosis. Electronically Signed   By: Jeb Levering M.D.   On:  04/22/2017 22:30    Assessment and Plan:   Principal Problem:   Atrial tachycardia ([place]) - Dr Lovena Le reviewed the ECG and pt needs to be reassessed w/ interrogation at bedside. - may be able to get her back in SR.  Active Problems:   Diabetes mellitus with complication (HCC) - reinforced need to stick to DM diet    Cardiac pacemaker in situ - St Jude - contacting rep    Chronic diastolic heart failure (HCC) - euvolemic on exam    Chest pain, high risk cardiac etiology - see cath report above, multiple opportunities for angina - CP may be from the high HR - admit, r/o MI, MV in am - manage medically for now with nitro paste - some CP (night-time sx) may be reflux, can use Mylanta prn as well - she has not had ASA because of her hx gastric bypass   Signed, Rosaria Ferries, PA-C  04/23/2017 9:16 AM

## 2017-04-23 NOTE — Consult Note (Signed)
error 

## 2017-04-23 NOTE — ED Notes (Signed)
Patient's husband up to desk.  Stating his wife is considering going home.  This RN reassessed patient's vitals with stethoscope for central HR, this RN picking up an irregular beat of approx 110BPM.  Pulse on dynamap states anywhere from 60-115.  Patient and this RN discussed concerns for coming and encouraged patient to stay.  Patient thanked this Therapist, sports and stated "please don;t worry about getting me back ahead of anyone else.  I truly appreciate you.  I just wanted to make sure I wasn't wasting your time".  This RN thanked them for being with [place].

## 2017-04-25 ENCOUNTER — Other Ambulatory Visit: Payer: Self-pay

## 2017-04-25 ENCOUNTER — Telehealth: Payer: Self-pay | Admitting: Internal Medicine

## 2017-04-25 ENCOUNTER — Emergency Department (HOSPITAL_COMMUNITY): Payer: Medicare Other

## 2017-04-25 ENCOUNTER — Encounter (HOSPITAL_COMMUNITY): Payer: Self-pay | Admitting: Emergency Medicine

## 2017-04-25 DIAGNOSIS — Z950 Presence of cardiac pacemaker: Secondary | ICD-10-CM | POA: Diagnosis not present

## 2017-04-25 DIAGNOSIS — Z85038 Personal history of other malignant neoplasm of large intestine: Secondary | ICD-10-CM | POA: Insufficient documentation

## 2017-04-25 DIAGNOSIS — I2510 Atherosclerotic heart disease of native coronary artery without angina pectoris: Secondary | ICD-10-CM | POA: Diagnosis not present

## 2017-04-25 DIAGNOSIS — F17210 Nicotine dependence, cigarettes, uncomplicated: Secondary | ICD-10-CM | POA: Insufficient documentation

## 2017-04-25 DIAGNOSIS — R079 Chest pain, unspecified: Secondary | ICD-10-CM | POA: Diagnosis not present

## 2017-04-25 DIAGNOSIS — Z9049 Acquired absence of other specified parts of digestive tract: Secondary | ICD-10-CM | POA: Diagnosis not present

## 2017-04-25 DIAGNOSIS — Z7901 Long term (current) use of anticoagulants: Secondary | ICD-10-CM | POA: Diagnosis not present

## 2017-04-25 DIAGNOSIS — Z794 Long term (current) use of insulin: Secondary | ICD-10-CM | POA: Diagnosis not present

## 2017-04-25 DIAGNOSIS — Z79899 Other long term (current) drug therapy: Secondary | ICD-10-CM | POA: Diagnosis not present

## 2017-04-25 DIAGNOSIS — F329 Major depressive disorder, single episode, unspecified: Secondary | ICD-10-CM | POA: Insufficient documentation

## 2017-04-25 DIAGNOSIS — I110 Hypertensive heart disease with heart failure: Secondary | ICD-10-CM | POA: Insufficient documentation

## 2017-04-25 DIAGNOSIS — I471 Supraventricular tachycardia: Secondary | ICD-10-CM | POA: Diagnosis not present

## 2017-04-25 DIAGNOSIS — Z91040 Latex allergy status: Secondary | ICD-10-CM | POA: Diagnosis not present

## 2017-04-25 DIAGNOSIS — R002 Palpitations: Secondary | ICD-10-CM | POA: Diagnosis present

## 2017-04-25 DIAGNOSIS — E119 Type 2 diabetes mellitus without complications: Secondary | ICD-10-CM | POA: Insufficient documentation

## 2017-04-25 DIAGNOSIS — I442 Atrioventricular block, complete: Secondary | ICD-10-CM | POA: Diagnosis not present

## 2017-04-25 DIAGNOSIS — I5032 Chronic diastolic (congestive) heart failure: Secondary | ICD-10-CM | POA: Diagnosis not present

## 2017-04-25 DIAGNOSIS — R000 Tachycardia, unspecified: Secondary | ICD-10-CM | POA: Diagnosis not present

## 2017-04-25 DIAGNOSIS — E039 Hypothyroidism, unspecified: Secondary | ICD-10-CM | POA: Diagnosis not present

## 2017-04-25 LAB — BASIC METABOLIC PANEL
Anion gap: 10 (ref 5–15)
BUN: 21 mg/dL — AB (ref 6–20)
CALCIUM: 8.9 mg/dL (ref 8.9–10.3)
CO2: 27 mmol/L (ref 22–32)
CREATININE: 1.33 mg/dL — AB (ref 0.44–1.00)
Chloride: 99 mmol/L — ABNORMAL LOW (ref 101–111)
GFR calc non Af Amer: 38 mL/min — ABNORMAL LOW (ref >60)
GFR, EST AFRICAN AMERICAN: 44 mL/min — AB (ref >60)
Glucose, Bld: 210 mg/dL — ABNORMAL HIGH (ref 65–99)
Potassium: 3.9 mmol/L (ref 3.5–5.1)
SODIUM: 136 mmol/L (ref 135–145)

## 2017-04-25 LAB — I-STAT TROPONIN, ED: TROPONIN I, POC: 0.02 ng/mL (ref 0.00–0.08)

## 2017-04-25 LAB — CBC
HCT: 33.9 % — ABNORMAL LOW (ref 36.0–46.0)
Hemoglobin: 10.7 g/dL — ABNORMAL LOW (ref 12.0–15.0)
MCH: 27.9 pg (ref 26.0–34.0)
MCHC: 31.6 g/dL (ref 30.0–36.0)
MCV: 88.3 fL (ref 78.0–100.0)
PLATELETS: 301 10*3/uL (ref 150–400)
RBC: 3.84 MIL/uL — AB (ref 3.87–5.11)
RDW: 15 % (ref 11.5–15.5)
WBC: 5.9 10*3/uL (ref 4.0–10.5)

## 2017-04-25 NOTE — Telephone Encounter (Signed)
Called patient back. Informed her that she is suppose to follow-up with PCP in 2 days according to her AVS. Patient has an appointment with PCP on  04/26/17. Patient has a follow-up with our office in about a week, which is the earliest appt. Informed patient that this should be fine. Patient verbalized understanding.

## 2017-04-25 NOTE — ED Triage Notes (Signed)
Pt states she is having 7/10 central cp and sob, pt states she believes that she is on VT. Pt was seen 3 days ago for the same complain.

## 2017-04-25 NOTE — Telephone Encounter (Signed)
Patient calling, was seen in Ed on 04-23-17 for rapid heart beat. Dr. Lovena Le saw patient in hospital and "used pacemaker to bring heart rate down."  Patient states that she was told to f/u within 1-2 days, the earliest appt time was 05-03-17, she would like to make sure this would be okay

## 2017-04-26 ENCOUNTER — Other Ambulatory Visit: Payer: Self-pay

## 2017-04-26 ENCOUNTER — Emergency Department (HOSPITAL_COMMUNITY)
Admission: EM | Admit: 2017-04-26 | Discharge: 2017-04-26 | Disposition: A | Discharge status: Home / Self Care | Payer: Medicare Other | Attending: Emergency Medicine | Admitting: Emergency Medicine

## 2017-04-26 ENCOUNTER — Ambulatory Visit: Payer: Medicare Other | Admitting: Nurse Practitioner

## 2017-04-26 DIAGNOSIS — I442 Atrioventricular block, complete: Secondary | ICD-10-CM | POA: Diagnosis not present

## 2017-04-26 DIAGNOSIS — I471 Supraventricular tachycardia: Secondary | ICD-10-CM | POA: Diagnosis not present

## 2017-04-26 LAB — CBG MONITORING, ED: GLUCOSE-CAPILLARY: 149 mg/dL — AB (ref 65–99)

## 2017-04-26 MED ORDER — METOPROLOL SUCCINATE ER 25 MG PO TB24: 25.0000 mg | ORAL_TABLET | Freq: Every day | ORAL | 0 refills | Status: DC

## 2017-04-26 MED ORDER — INSULIN GLARGINE 100 UNIT/ML ~~LOC~~ SOLN
20.0000 [IU] | Freq: Once | SUBCUTANEOUS | Status: AC
  Administered 2017-04-26: 20 [IU] via SUBCUTANEOUS
  Filled 2017-04-26 (×2): qty 0.2

## 2017-04-26 MED ORDER — METOPROLOL SUCCINATE ER 25 MG PO TB24
25.0000 mg | ORAL_TABLET | Freq: Once | ORAL | Status: AC
  Administered 2017-04-26: 25 mg via ORAL
  Filled 2017-04-26: qty 1

## 2017-04-26 MED ORDER — FLECAINIDE ACETATE 50 MG PO TABS: 50.0000 mg | ORAL_TABLET | Freq: Two times a day (BID) | ORAL | 0 refills | Status: DC

## 2017-04-26 MED ORDER — APIXABAN 5 MG PO TABS
5.0000 mg | ORAL_TABLET | Freq: Once | ORAL | Status: AC
  Administered 2017-04-26: 5 mg via ORAL
  Filled 2017-04-26: qty 1

## 2017-04-26 MED ORDER — FLECAINIDE ACETATE 100 MG PO TABS
200.0000 mg | ORAL_TABLET | Freq: Once | ORAL | Status: AC
  Administered 2017-04-26: 200 mg via ORAL
  Filled 2017-04-26: qty 2

## 2017-04-26 NOTE — ED Notes (Signed)
Pt resting comfortably, still waiting for cards to adjust her pacemaker.

## 2017-04-26 NOTE — ED Notes (Addendum)
Assisted pt to the BR with steady gait, uses a cane.  Pt is A&Ox 4.  Denies any complaints at this time.  Waiting for cards to adjust her pacemaker

## 2017-04-26 NOTE — ED Notes (Signed)
Mary [name] from [facility] [phone] in case she is needed to "tweak it" tonight is she isn't going to be admitted.  States that her report does show her atrial rate dropping into the 70's and coming back up to 110. Mary [name] to fax full report.

## 2017-04-26 NOTE — ED Notes (Signed)
Pt is resting comfortably, Ursuy PA at bedside and is aware of pt's low BP

## 2017-04-26 NOTE — Consult Note (Signed)
Cardiology Consultation:   Patient ID: Mary [name]; [id number]; 11/14/1940   Admit date: 04/26/2017 Date of Consult: 04/26/2017  Primary Care Provider: Hoyt Koch, MD Primary Electrophysiologist:  Dr. Rayann Heman   Patient Profile:   Mary [name] is a 76 y.o. female with a hx of CHB w/PPM, AFib, HTN, HLD, DM who is being seen today for the evaluation of Atrial tachycardia at the request of Dr. Betsey Holiday.  History of Present Illness:   Ms. Kindall was seen recently in the ER 04/23/17 with c/o palpitations, CP noted in an AT pacing at upper tracking rate and pace terminated by Dr. Lovena Le.  She returns today with recurrent palpitations.  She denies any CP or SOB, felt a weak, though no near syncope or syncope.   LABS K+ 3.9 BUN/Creat 21/1.33, poc Trop 0.02 WBC 5.9 H/H 10.7/33.9 Plts 301 04/23/17 TSH 6.855  Home meds do not include any rate limiting meds currently, Eliquis for a/c  Past Medical History:  Diagnosis Date  . Arthritis   . Atrial tachycardia ([place]) 04/23/2017  . CAD (coronary artery disease)    non obstructive  . 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 Procedure complicated by an atrial lead dislodgment and repeat procedure, 12/2013   . Carotid stenosis    Carotid US 5/16:  Bilateral ICA 1-39%; L vertebral retrograde; L BP 126/49, R BP 140/57  . CHF (congestive heart failure) ([place])   . Chronic lower back pain   . Colon cancer ([place])    a. s/p chemo  . Colon polyps   . Diabetic peripheral neuropathy ([place])   . DVT (deep venous thrombosis) ([place]) 2013   "twice behind knee on left side" (06/25/2013)  . Glaucoma   . Hyperlipidemia   . Hypertension   . Hypothyroidism   . Iron deficiency anemia   . Multinodular goiter   . Sleep apnea    a. resolved post weight loss   . Type II diabetes mellitus ([place])     Past Surgical History:  Procedure Laterality Date  . CARDIAC CATHETERIZATION  2006   Never  had PCI, 3 caths total. Last one in [place]  . CARDIAC CATHETERIZATION N/A 02/04/2015   Procedure: Left Heart Cath and Coronary Angiography;  Surgeon: Belva Crome, MD;  Location: [facility];  Service: Cardiovascular;  Laterality: N/A;  . CATARACT EXTRACTION, BILATERAL Bilateral 2013   "and put stent in my left eye for glaucoma" (06/25/2013)  . CHOLECYSTECTOMY  1980's  . COLECTOMY  10/2010   Tumor removal  . EYE MUSCLE SURGERY Bilateral ~ 1963   "muscles too long; eyes would go out and up; tied muscles to hold my eyes straight" (06/25/2013)  . LEAD REVISION  01-03-2014   atrial lead revision by Dr Rayann Heman  . LEAD REVISION N/A 01/03/2014   Procedure: LEAD REVISION;  Surgeon: Coralyn Mark, MD;  Location: [facility];  Service: Cardiovascular;  Laterality: N/A;  . PACEMAKER INSERTION  01/02/2014   STJ Assurity dual chamber pacemaker implnated by Dr Lovena Le for SSS  . PERMANENT PACEMAKER INSERTION N/A 01/02/2014   Procedure: PERMANENT PACEMAKER INSERTION;  Surgeon: Evans Lance, MD;  Location: [facility] C[facility];  Service: Cardiovascular;  Laterality: N/A;  . PORTACATH PLACEMENT Left 11/2010  . ROUX-EN-Y GASTRIC BYPASS  2009  . ULNAR TUNNEL RELEASE Left ~ 2008  . UMBILICAL HERNIA REPAIR  1970's?    (06/25/2013)     Home  Medications:  Prior to Admission medications   Medication Sig Start Date End Date Taking? Authorizing Provider  atorvastatin (LIPITOR) 80 MG tablet TAKE 1 TABLET BY MOUTH ONCE DAILY Patient taking differently: TAKE 80 MG BY MOUTH AT BEDTIME 03/21/17  Yes Calone, Ples Specter, FNP  bimatoprost (LUMIGAN) 0.01 % SOLN Place 1 drop into the right eye at bedtime.    Yes [provider]  buPROPion (WELLBUTRIN XL) 150 MG 24 hr tablet take 1 tablet by mouth daily 11/02/16  Yes Hoyt Koch, MD  ELIQUIS 5 MG TABS tablet TAKE 1 TABLET BY MOUTH TWICE A DAY 04/20/17  Yes Cru Kritikos, Jeneen Rinks, MD  furosemide (LASIX) 80 MG tablet Take 1 tablet (80 mg total) by mouth daily. 10/12/16  Yes  Eileen Stanford, PA-C  gabapentin (NEURONTIN) 300 MG capsule Take 300 mg by mouth at bedtime.    Yes [provider]  HYDROcodone-acetaminophen (NORCO/VICODIN) 5-325 MG tablet Take 1 tablet by mouth every 6 (six) hours as needed for moderate pain. 07/09/16  Yes Hoyt Koch, MD  insulin glargine (LANTUS) 100 unit/mL SOPN Inject 36 Units into the skin at bedtime.    Yes [provider]  levothyroxine (SYNTHROID, LEVOTHROID) 112 MCG tablet Take 112 mcg by mouth daily before breakfast.   Yes [provider]  nitroGLYCERIN (NITROSTAT) 0.4 MG SL tablet Place 1 tablet (0.4 mg total) under the tongue every 5 (five) minutes as needed for chest pain. 01/13/15  Yes Alesa Echevarria, Jeneen Rinks, MD  potassium chloride SA (K-DUR,KLOR-CON) 20 MEQ tablet Take 2 tablets (40 mEq total) by mouth once. Patient taking differently: Take 40 mEq by mouth at bedtime.  08/11/16 04/26/17 Yes Eileen Stanford, PA-C  losartan (COZAAR) 25 MG tablet Take 1 tablet (25 mg total) by mouth daily. 03/30/17 06/28/17  [name], MD    Inpatient Medications: Scheduled Meds:  Continuous Infusions:  PRN Meds:   Allergies:    Allergies  Allergen Reactions  . Other Other (See Comments)    NO Blind Scopes with Naso Gastric tube.  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 Adhesive [Tape]     Tape - burns - pls use paper tape or elastic bandage  . Aspirin Other (See Comments)    S/P gastric bypass surgery, states her MD told her to not take Aspirin.  . Latex     Tape= burns  . Nsaids Other (See Comments)    S/P gastric bypass-told not to take. GI bleeds with NSAIDS    Social History:   Social History   Social History  . Marital status: Married    Spouse name: [name]  . Number of children: 0  . Years of education: N/A   Occupational History  . Retired     office work   Social History Main Topics  . Smoking status: Light Tobacco Smoker    Packs/day: 1.00    Years: 30.00    Types: Cigarettes      Last attempt to quit: 05/03/2013  . Smokeless tobacco: Never Used     Comment: 06/25/2013 "smoked off and on since I was 22; stopped 10/2007 til 08/30/2012 then started again cause of stress"  . Alcohol use 0.0 oz/week     Comment: 06/25/2013 "glass of wine 1-2 times/yr"  . Drug use: No  . Sexual activity: Yes   Other Topics Concern  . Not on file   Social History Narrative   Married to husband, [name]   No children   Retired Sales promotion account executive  Likes to paint and carve wood/clay   Recently moved back here from [place]     Family History:    Family History  Problem Relation Age of Onset  . Breast cancer Mother   . Diabetes Mother   . Heart disease Father   . Heart attack Father   . Heart attack Sister   . Heart attack Brother   . Breast cancer Sister   . Breast cancer Maternal Aunt        x 2 aunts  . Pancreatic cancer Brother   . Prostate cancer Brother   . Colon polyps Brother   . Bladder Cancer Cousin   . Clotting disorder Sister   . Diabetes Sister        x 3  . Diabetes Maternal Grandmother      ROS:  Please see the history of present illness.  ROS  All other ROS reviewed and negative.     Physical Exam/Data:   Vitals:   04/26/17 0530 04/26/17 0715 04/26/17 0730 04/26/17 0815  BP: 129/78 109/60 (!) 126/94 115/82  Pulse: (!) 120 (!) 121 (!) 122 (!) 121  Resp: 20 (!) 24 (!) 26 (!) 28  Temp:      TempSrc:      SpO2: 94% 97% 93% 97%  Weight:      Height:       No intake or output data in the 24 hours ending 04/26/17 1105 Filed Weights   04/25/17 2008  Weight: 246 lb (111.6 kg)   Body mass index is 38.53 kg/m.  General:  Well nourished, well developed, in no acute distress HEENT: normal Lymph: no adenopathy Neck: no JVD Endocrine:  No thryomegaly Vascular: No carotid bruits Cardiac:  RRR; tachycardic no murmurs, gallops or rubs Lungs:  CTA b/l, no wheezing, rhonchi or rales  Abd: soft, nontender Ext: no edema Musculoskeletal:  No deformities,  BUE and BLE strength normal and equal Skin: warm and dry  Neuro:  no focal abnormalities noted Psych:  Normal affect   EKG:  The EKG was personally reviewed and demonstrates:  V pacing at 111, likely recurrent AT Telemetry:  Telemetry was personally reviewed and demonstrates:  V pacing 110-120  Relevant CV Studies:  08/02/16: TTE Study Conclusions - Left ventricle: The cavity size was normal. Wall thickness was   increased in a pattern of mild LVH. Systolic function was normal.   The estimated ejection fraction was in the range of 60% to 65%.   Wall motion was normal; there were no regional wall motion   abnormalities. - Mitral valve: Mildly to moderately calcified annulus. - Left atrium: The atrium was mildly dilated. - Right atrium: The atrium was mildly dilated. - Pulmonary arteries: Systolic pressure was mildly increased. PA   peak pressure: 36 mm Hg (S).   Laboratory Data:  Chemistry Recent Labs Lab 04/22/17 2132 04/25/17 2009  NA 137 136  K 4.1 3.9  CL 99* 99*  CO2 29 27  GLUCOSE 167* 210*  BUN 20 21*  CREATININE 1.28* 1.33*  CALCIUM 9.1 8.9  GFRNONAA 40* 38*  GFRAA 46* 44*  ANIONGAP 9 10    No results for input(s): PROT, ALBUMIN, AST, ALT, ALKPHOS, BILITOT in the last 168 hours. Hematology Recent Labs Lab 04/22/17 2132 04/25/17 2009  WBC 8.3 5.9  RBC 4.12 3.84*  HGB 11.6* 10.7*  HCT 35.9* 33.9*  MCV 87.1 88.3  MCH 28.2 27.9  MCHC 32.3 31.6  RDW 14.4 15.0  PLT 327 301  Cardiac EnzymesNo results for input(s): TROPONINI in the last 168 hours.  Recent Labs Lab 04/22/17 2148 04/23/17 0742 04/25/17 2041  TROPIPOC 0.00 0.00 0.02    BNPNo results for input(s): BNP, PROBNP in the last 168 hours.  DDimer  Recent Labs Lab 04/23/17 0736  DDIMER 0.32    Radiology/Studies:  Dg Chest 2 View Result Date: 04/25/2017 CLINICAL DATA:  Central chest pain and dyspnea. EXAM: CHEST  2 VIEW COMPARISON:  04/22/2017 FINDINGS: The heart size and mediastinal  contours are within normal limits. Left-sided pacemaker apparatus with right atrial and right ventricular leads are again seen without change. There is aortic atherosclerosis without aneurysm. Surgical clips are again noted in the left upper quadrant of the abdomen. Both lungs are clear. The visualized skeletal structures are unremarkable. IMPRESSION: No active cardiopulmonary disease.  Aortic atherosclerosis. Electronically Signed   By: Ashley Royalty M.D.   On: 04/25/2017 21:37     Assessment and Plan:   1. Atrial tachycardia, V tracking     BP looks OK, no active symptoms (lower numbers appear artifactual)     Dr. Rayann Heman at bedside, attempts to pace terminate her AT were unsuccessful, she went into AFlutter, with ode switch to V pacing 70.   She has hx of NOD by cath in 2016 by notes and normal LVEF, will give Flecainide 200mg  and Toprol XL 25mg  once now monitor and likely plan for discharge to home from the ER with Flecainide 50mg  BID and Toprol XL 25mg  daily  Follow up with EP in place         Signed, Baldwin Jamaica, PA-C  04/26/2017 11:05 AM   I have seen, examined the patient, and reviewed the above assessment and plan.  Changes to above are made where necessary.  On exam, tacycardic regular rhythm.  PPM interrogation reveals atach.  Will start on flecainide and metoprolol with close outpatient follow-up.  Co Sign: [name], MD 04/27/2017 12:47 PM

## 2017-04-26 NOTE — Discharge Instructions (Signed)
Take the prescribed medication as directed. Follow-up with Dr. Rayann Heman as scheduled Return to the ED for new or worsening symptoms.

## 2017-04-26 NOTE — ED Notes (Signed)
ED Provider at bedside. 

## 2017-04-26 NOTE — ED Provider Notes (Signed)
  Care signed out to me this morning awaitng EP consultation for adjustment of pacemaker setting.  EP APP was paged at [facility] this morning.  9:19 AM Checked on patient, HR still 115-120 range during re-check.  no one from cardiology has been down to see her.  Never received call back from cardiology.  Have spoken with cardmaster-- will send someone down to see patient.  9:35 AM Discussed with Debroah Baller, APP with EP-- will come down to see patient.  Patient has been seen by EP APP and Dr. Rayann Heman.  Plan to adjust pacemaker.  Given trial of flecainide and toprol here.  BP did drop some, HR has improved.  Still needs pacemaker settings adjusted.  2:48 PM [employer] rep at bedside setting up to adjust settings.  Per cards EP APP, patient stable for discharge from their standpoint.  Recommend d/c home with flecainide 50 mg twice daily as well as Toprol-XL 25 mg daily  I have written these for patient. She has follow-up scheduled for next week for re-check.    Patient discharged home in stable condition.   Larene Pickett, PA-C 04/26/17 1511    Carmin Muskrat, MD 04/29/17 [phone]

## 2017-04-26 NOTE — ED Notes (Signed)
EKG obtained per [facility] request.

## 2017-04-26 NOTE — ED Notes (Signed)
Meal and drink given to pt, okayed by Lattie Haw EDPA

## 2017-04-26 NOTE — ED Provider Notes (Signed)
[facility] DEPT Provider Note   CSN: [id number] Arrival date & time: 04/25/17  1959     History   Chief Complaint Chief Complaint  Patient presents with  . Chest Pain  . Shortness of Breath    HPI Mary [name] is a 76 y.o. female.  Mary [name] is a 76 y.o. Female with a history of atrial tachycardia with St. Jude pacemaker, CHF, PAF on Eliquis, and diabetes who presents to the ED complaining of feeling fatigued, and palpations for the past two days. She reports she believes that her atrial tachycardia is back. She was seen 3 days ago for the same problem and saw Dr. Crissie Sickles to adjusted her St. Jude pacemaker. She was in atrial tachycardia rhythm at that time. She reports feeling fine at discharge and then her symptoms returned about 2 days ago. She is told to return to the emergency department if her symptoms return. She reports more chest discomfort and palpitations and not chest pain. She denies current shortness of breath, but reports she feels very fatigued and winded if she is ambulating. She denies fevers, coughing, syncope, headaches, numbness, tingling, weakness or rashes.   The history is provided by the patient, medical records and the spouse. No language interpreter was used.  Chest Pain   Associated symptoms include palpitations and shortness of breath. Pertinent negatives include no abdominal pain, no back pain, no cough, no fever, no headaches, no nausea and no vomiting.  Shortness of Breath  Associated symptoms include chest pain. Pertinent negatives include no fever, no headaches, no sore throat, no neck pain, no cough, no wheezing, no vomiting, no abdominal pain and no rash.    Past Medical History:  Diagnosis Date  . Arthritis   . Atrial tachycardia ([place]) 04/23/2017  . CAD (coronary artery disease)    non obstructive  . 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 Procedure complicated by an atrial  lead dislodgment and repeat procedure, 12/2013   . Carotid stenosis    Carotid US 5/16:  Bilateral ICA 1-39%; L vertebral retrograde; L BP 126/49, R BP 140/57  . CHF (congestive heart failure) ([place])   . Chronic lower back pain   . Colon cancer ([place])    a. s/p chemo  . Colon polyps   . Diabetic peripheral neuropathy ([place])   . DVT (deep venous thrombosis) ([place]) 2013   "twice behind knee on left side" (06/25/2013)  . Glaucoma   . Hyperlipidemia   . Hypertension   . Hypothyroidism   . Iron deficiency anemia   . Multinodular goiter   . Sleep apnea    a. resolved post weight loss   . Type II diabetes mellitus [facility])     Patient Active Problem List   Diagnosis Date Noted  . Atrial tachycardia ([place]) 04/23/2017  . Chest pain   . SOB (shortness of breath)   . Second degree AV block, Mobitz type II   . Depression 08/02/2016  . Chronic diastolic heart failure ([place]) 08/02/2016  . DVT (deep vein thrombosis) in pregnancy ([place]) 08/02/2016  . Tobacco abuse 08/02/2016  . Chronic venous insufficiency 02/21/2015  . 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 Dizziness 01/27/2015  . 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 Atrioventricular block, complete ([place]) 01/02/2014  . PAF (paroxysmal atrial fibrillation) ([place]) 08/29/2013  . Obesity 05/05/2013  . CAD (coronary artery disease)   . Diabetes mellitus with complication (  HCC)   . Anemia   . Hypothyroidism   . Hypertension   . Colon cancer ([place])   . Hyperlipidemia     Past Surgical History:  Procedure Laterality Date  . CARDIAC CATHETERIZATION  2006   Never had PCI, 3 caths total. Last one in [place]  . CARDIAC CATHETERIZATION N/A 02/04/2015   Procedure: Left Heart Cath and Coronary Angiography;  Surgeon: Belva Crome, MD;  Location: [facility] CV LAB;  Service: Cardiovascular;  Laterality: N/A;  . CATARACT EXTRACTION, BILATERAL Bilateral 2013   "and put stent in my left eye for glaucoma" (06/25/2013)  . CHOLECYSTECTOMY  1980's  .  COLECTOMY  10/2010   Tumor removal  . EYE MUSCLE SURGERY Bilateral ~ 1963   "muscles too long; eyes would go out and up; tied muscles to hold my eyes straight" (06/25/2013)  . LEAD REVISION  01-03-2014   atrial lead revision by Dr Rayann Heman  . LEAD REVISION N/A 01/03/2014   Procedure: LEAD REVISION;  Surgeon: Coralyn Mark, MD;  Location: [facility] CATH LAB;  Service: Cardiovascular;  Laterality: N/A;  . PACEMAKER INSERTION  01/02/2014   STJ Assurity dual chamber pacemaker implnated by Dr Lovena Le for SSS  . PERMANENT PACEMAKER INSERTION N/A 01/02/2014   Procedure: PERMANENT PACEMAKER INSERTION;  Surgeon: Evans Lance, MD;  Location: [facility] CATH LAB;  Service: Cardiovascular;  Laterality: N/A;  . PORTACATH PLACEMENT Left 11/2010  . ROUX-EN-Y GASTRIC BYPASS  2009  . ULNAR TUNNEL RELEASE Left ~ 2008  . UMBILICAL HERNIA REPAIR  1970's?    (06/25/2013)    OB History    No data available       Home Medications    Prior to Admission medications   Medication Sig Start Date End Date Taking? Authorizing Provider  atorvastatin (LIPITOR) 80 MG tablet TAKE 1 TABLET BY MOUTH ONCE DAILY Patient taking differently: TAKE 80 MG BY MOUTH AT BEDTIME 03/21/17  Yes Calone, Ples Specter, FNP  bimatoprost (LUMIGAN) 0.01 % SOLN Place 1 drop into the right eye at bedtime.    Yes [provider]  buPROPion (WELLBUTRIN XL) 150 MG 24 hr tablet take 1 tablet by mouth daily 11/02/16  Yes Hoyt Koch, MD  ELIQUIS 5 MG TABS tablet TAKE 1 TABLET BY MOUTH TWICE A DAY 04/20/17  Yes Allred, Jeneen Rinks, MD  furosemide (LASIX) 80 MG tablet Take 1 tablet (80 mg total) by mouth daily. 10/12/16  Yes Eileen Stanford, PA-C  gabapentin (NEURONTIN) 300 MG capsule Take 300 mg by mouth at bedtime.    Yes [provider]  HYDROcodone-acetaminophen (NORCO/VICODIN) 5-325 MG tablet Take 1 tablet by mouth every 6 (six) hours as needed for moderate pain. 07/09/16  Yes Hoyt Koch, MD  insulin glargine (LANTUS) 100 unit/mL  SOPN Inject 36 Units into the skin at bedtime.    Yes [provider]  levothyroxine (SYNTHROID, LEVOTHROID) 112 MCG tablet Take 112 mcg by mouth daily before breakfast.   Yes [provider]  nitroGLYCERIN (NITROSTAT) 0.4 MG SL tablet Place 1 tablet (0.4 mg total) under the tongue every 5 (five) minutes as needed for chest pain. 01/13/15  Yes Allred, Jeneen Rinks, MD  potassium chloride SA (K-DUR,KLOR-CON) 20 MEQ tablet Take 2 tablets (40 mEq total) by mouth once. Patient taking differently: Take 40 mEq by mouth at bedtime.  08/11/16 04/26/17 Yes Eileen Stanford, PA-C  losartan (COZAAR) 25 MG tablet Take 1 tablet (25 mg total) by mouth daily. 03/30/17 06/28/17  [name], MD  Family History Family History  Problem Relation Age of Onset  . Breast cancer Mother   . Diabetes Mother   . Heart disease Father   . Heart attack Father   . Heart attack Sister   . Heart attack Brother   . Breast cancer Sister   . Breast cancer Maternal Aunt        x 2 aunts  . Pancreatic cancer Brother   . Prostate cancer Brother   . Colon polyps Brother   . Bladder Cancer Cousin   . Clotting disorder Sister   . Diabetes Sister        x 3  . Diabetes Maternal Grandmother     Social History Social History  Substance Use Topics  . Smoking status: Light Tobacco Smoker    Packs/day: 1.00    Years: 30.00    Types: Cigarettes    Last attempt to quit: 05/03/2013  . Smokeless tobacco: Never Used     Comment: 06/25/2013 "smoked off and on since I was 22; stopped 10/2007 til 08/30/2012 then started again cause of stress"  . Alcohol use 0.0 oz/week     Comment: 06/25/2013 "glass of wine 1-2 times/yr"     Allergies   Other; Adhesive [tape]; Aspirin; Latex; and Nsaids   Review of Systems Review of Systems  Constitutional: Positive for fatigue. Negative for chills and fever.  HENT: Negative for congestion and sore throat.   Eyes: Negative for visual disturbance.  Respiratory: Positive for  shortness of breath. Negative for cough and wheezing.   Cardiovascular: Positive for chest pain and palpitations.  Gastrointestinal: Negative for abdominal pain, diarrhea, nausea and vomiting.  Genitourinary: Negative for dysuria.  Musculoskeletal: Negative for back pain and neck pain.  Skin: Negative for rash.  Neurological: Negative for light-headedness and headaches.     Physical Exam Updated Vital Signs BP 109/83   Pulse (!) 117   Temp 98.1 F (36.7 C) (Oral)   Resp 16   Ht 5\' 7"  (1.702 m)   Wt 111.6 kg (246 lb)   SpO2 98%   BMI 38.53 kg/m   Physical Exam  Constitutional: She is oriented to person, place, and time. She appears well-developed and well-nourished. No distress.  Nontoxic appearing.  HENT:  Head: Normocephalic and atraumatic.  Mouth/Throat: Oropharynx is clear and moist.  Eyes: Pupils are equal, round, and reactive to light. Conjunctivae are normal. Right eye exhibits no discharge. Left eye exhibits no discharge.  Neck: Neck supple.  Cardiovascular: Normal heart sounds and intact distal pulses.  Exam reveals no gallop and no friction rub.   No murmur heard. HR between 106-120 on my exam.   Pulmonary/Chest: Effort normal and breath sounds normal. No respiratory distress. She has no wheezes. She has no rales.  Abdominal: Soft. There is no tenderness.  Musculoskeletal: She exhibits no edema or tenderness.  No LE edema or TTP.   Lymphadenopathy:    She has no cervical adenopathy.  Neurological: She is alert and oriented to person, place, and time. She exhibits normal muscle tone. Coordination normal.  Alert and oriented. Pleasant.   Skin: Skin is warm and dry. Capillary refill takes less than 2 seconds. No rash noted. She is not diaphoretic. No erythema. No pallor.  Psychiatric: She has a normal mood and affect. Her behavior is normal.  Nursing note and vitals reviewed.    ED Treatments / Results  Labs (all labs ordered are listed, but only abnormal  results are displayed) Labs Reviewed  BASIC  METABOLIC PANEL - Abnormal; Notable for the following:       Result Value   Chloride 99 (*)    Glucose, Bld 210 (*)    BUN 21 (*)    Creatinine, Ser 1.33 (*)    GFR calc non Af Amer 38 (*)    GFR calc Af Amer 44 (*)    All other components within normal limits  CBC - Abnormal; Notable for the following:    RBC 3.84 (*)    Hemoglobin 10.7 (*)    HCT 33.9 (*)    All other components within normal limits  I-STAT TROPONIN, ED  CBG MONITORING, ED    EKG  EKG Interpretation  Date/Time:  Monday April 25 2017 20:02:12 EDT Ventricular Rate:  111 PR Interval:    QRS Duration: 170 QT Interval:  412 QTC Calculation: 560 R Axis:   -74 Text Interpretation:  Ventricular-paced rhythm Abnormal ECG Confirmed by Orpah Greek [phone]) on 04/26/2017 4:18:13 AM       Radiology Dg Chest 2 View  Result Date: 04/25/2017 CLINICAL DATA:  Central chest pain and dyspnea. EXAM: CHEST  2 VIEW COMPARISON:  04/22/2017 FINDINGS: The heart size and mediastinal contours are within normal limits. Left-sided pacemaker apparatus with right atrial and right ventricular leads are again seen without change. There is aortic atherosclerosis without aneurysm. Surgical clips are again noted in the left upper quadrant of the abdomen. Both lungs are clear. The visualized skeletal structures are unremarkable. IMPRESSION: No active cardiopulmonary disease.  Aortic atherosclerosis. Electronically Signed   By: Ashley Royalty M.D.   On: 04/25/2017 21:37    Procedures Procedures (including critical care time)  Medications Ordered in ED Medications - No data to display   Initial Impression / Assessment and Plan / ED Course  I have reviewed the triage vital signs and the nursing notes.  Pertinent labs & imaging results that were available during my care of the patient were reviewed by me and considered in my medical decision making (see chart for details).    This is a  76 y.o. Female with a history of atrial tachycardia with St. Jude pacemaker, CHF, PAF on Eliquis, and diabetes who presents to the ED complaining of feeling fatigued, and palpations for the past two days. She reports she believes that her atrial tachycardia is back. She was seen 3 days ago for the same problem and saw Dr. Crissie Sickles to adjusted her St. Jude pacemaker. She was in atrial tachycardia rhythm at that time. She reports feeling fine at discharge and then her symptoms returned about 2 days ago. She is told to return to the emergency department if her symptoms return. She reports more chest discomfort and palpitations and not chest pain.  On exam patient is afebrile nontoxic appearing. Heart rate is between 108-120 on my exam. She is not hypotensive. Ventricular paced rhythm on monitor. It looks unchanged from her EKG that was performed just 3 days ago. Blood work shows normal troponin and labs around her baseline. Suspect this his her atrial tachycardia again.   I consulted with cardiologist Dr. Aundra Dubin and he would like [place] to contact the electrophysiology APP around 7-7:30 am when they arrive so that they can try and adjust her pacemaker again. Hopefully she will not require an admission. Will let EP evaluate.   At shift change plan is to consult with EP APP on their arrival this morning for further adjustment of pacemaker. Patient and family agree with plan. Patient  care signed out to oncoming team.   This patient was discussed with and evaluated by Dr. Betsey Holiday who agrees with assessment and plan.    Final Clinical Impressions(s) / ED Diagnoses   Final diagnoses:  Atrial tachycardia [facility])    New Prescriptions New Prescriptions   No medications on file     [name], Hershal Coria 04/26/17 [id number]    Orpah Greek, MD 04/26/17 [phone]

## 2017-04-26 NOTE — ED Provider Notes (Signed)
Patient presented to the ER with palpitations.  Face to face Exam: HEENT - PERRLA Lungs - CTAB Heart - RRR, no M/R/G Abd - S/NT/ND Neuro - alert, oriented x3  Plan: Patient with history of atrial tachycardia presents with palpitations and EKG consistent with recurrent atrial tachycardia. Patient briefly discussed with cardiology on call, recommends holding patient in the ER until EP comes in the morning.   Orpah Greek, MD 04/26/17 [phone]

## 2017-05-01 NOTE — Progress Notes (Signed)
Cardiology Office Note Date:  05/03/2017  Patient ID:  Mary, [name] 19-Apr-1941, MRN [id number] PCP:  Hoyt Koch, MD  Electrophysiologist: Dr. Rayann Heman    Chief Complaint: post ER visit  History of Present Illness: Mary [name] is a 76 y.o. female with history of CHB w/PPM, AFib, ATach, HTN, HLD, DM who had a couple recent ER visits with palpitations.  The 1st she was found in an ATach and was pace terminated by Dr. Lovena Le, her PPM reprogrammed.  The most recent 04/26/17 again in Atach pacer tracking with rates 110-120, Dr. Rayann Heman attempted to pace terminated though resulting in AFlutter, w/AMS and pacing 70.  Se was given flecainide/Toprol and monitored.  Unfortunatly she returned to Atach though with reprogramming pacing rates were improved 80s' (wenceback behavior), discharged from the ER on Flecinide and toprol, feeling, much improved.  She comes in today accompanied by her husband.  She reports that since starting the new medicines she gets weak a little while after taking them and finds her BP quite low, at least once SBP reported 80's.  She mentions that she had not been taking the losartan and started that as well.  She has only once and only briefly felt a little fluttering, no CP or SOB, no near syncope or syncope.  No bleeding or signs of bleeding.  She reports her daily home weights stable, up/down a pound or 2, no steady gain.  Device information: SJM dual chamber PPM, implanted 01/02/14, Dr. Rayann Heman  Past Medical History:  Diagnosis Date  . Arthritis   . Atrial tachycardia ([place]) 04/23/2017  . CAD (coronary artery disease)    non obstructive  . 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 Procedure complicated by an atrial lead dislodgment and repeat procedure, 12/2013   . Carotid stenosis    Carotid US 5/16:  Bilateral ICA 1-39%; L vertebral retrograde; L BP 126/49, R BP 140/57  . CHF (congestive heart  failure) ([place])   . Chronic lower back pain   . Colon cancer ([place])    a. s/p chemo  . Colon polyps   . Diabetic peripheral neuropathy ([place])   . DVT (deep venous thrombosis) ([place]) 2013   "twice behind knee on left side" (06/25/2013)  . Glaucoma   . Hyperlipidemia   . Hypertension   . Hypothyroidism   . Iron deficiency anemia   . Multinodular goiter   . Sleep apnea    a. resolved post weight loss   . Type II diabetes mellitus ([place])     Past Surgical History:  Procedure Laterality Date  . CARDIAC CATHETERIZATION  2006   Never had PCI, 3 caths total. Last one in [place]  . CARDIAC CATHETERIZATION N/A 02/04/2015   Procedure: Left Heart Cath and Coronary Angiography;  Surgeon: Belva Crome, MD;  Location: [facility] CV LAB;  Service: Cardiovascular;  Laterality: N/A;  . CATARACT EXTRACTION, BILATERAL Bilateral 2013   "and put stent in my left eye for glaucoma" (06/25/2013)  . CHOLECYSTECTOMY  1980's  . COLECTOMY  10/2010   Tumor removal  . EYE MUSCLE SURGERY Bilateral ~ 1963   "muscles too long; eyes would go out and up; tied muscles to hold my eyes straight" (06/25/2013)  . LEAD REVISION  01-03-2014   atrial lead revision by Dr Rayann Heman  . LEAD REVISION N/A 01/03/2014   Procedure: LEAD REVISION;  Surgeon: Coralyn Mark, MD;  Location: [facility] CATH LAB;  Service: Cardiovascular;  Laterality: N/A;  . PACEMAKER INSERTION  01/02/2014   STJ Assurity dual chamber pacemaker implnated by Dr Lovena Le for SSS  . PERMANENT PACEMAKER INSERTION N/A 01/02/2014   Procedure: PERMANENT PACEMAKER INSERTION;  Surgeon: Evans Lance, MD;  Location: [facility] CATH LAB;  Service: Cardiovascular;  Laterality: N/A;  . PORTACATH PLACEMENT Left 11/2010  . ROUX-EN-Y GASTRIC BYPASS  2009  . ULNAR TUNNEL RELEASE Left ~ 2008  . UMBILICAL HERNIA REPAIR  1970's?    (06/25/2013)    Current Outpatient Prescriptions  Medication Sig Dispense Refill  . atorvastatin (LIPITOR) 80 MG tablet TAKE 1 TABLET BY MOUTH ONCE DAILY (Patient  taking differently: TAKE 80 MG BY MOUTH AT BEDTIME) 30 tablet 1  . bimatoprost (LUMIGAN) 0.01 % SOLN Place 1 drop into the right eye at bedtime.     Marland Kitchen buPROPion (WELLBUTRIN XL) 150 MG 24 hr tablet take 1 tablet by mouth daily 30 tablet 6  . ELIQUIS 5 MG TABS tablet TAKE 1 TABLET BY MOUTH TWICE A DAY 180 tablet 3  . flecainide (TAMBOCOR) 50 MG tablet Take 1 tablet (50 mg total) by mouth 2 (two) times daily. 60 tablet 0  . furosemide (LASIX) 80 MG tablet Take 1 tablet (80 mg total) by mouth daily. 30 tablet 9  . gabapentin (NEURONTIN) 300 MG capsule Take 300 mg by mouth at bedtime.     Marland Kitchen HYDROcodone-acetaminophen (NORCO/VICODIN) 5-325 MG tablet Take 1 tablet by mouth every 6 (six) hours as needed for moderate pain. 45 tablet 0  . insulin glargine (LANTUS) 100 unit/mL SOPN Inject 36 Units into the skin at bedtime.     Marland Kitchen levothyroxine (SYNTHROID, LEVOTHROID) 112 MCG tablet Take 112 mcg by mouth daily before breakfast.    . metoprolol succinate (TOPROL-XL) 25 MG 24 hr tablet Take 1 tablet (25 mg total) by mouth daily. 30 tablet 0  . nitroGLYCERIN (NITROSTAT) 0.4 MG SL tablet Place 1 tablet (0.4 mg total) under the tongue every 5 (five) minutes as needed for chest pain. 90 tablet 3  . potassium chloride SA (K-DUR,KLOR-CON) 20 MEQ tablet Take 2 tablets (40 mEq total) by mouth once. (Patient taking differently: Take 40 mEq by mouth at bedtime. ) 180 tablet 3   No current facility-administered medications for this visit.     Allergies:   Other; Adhesive [tape]; Aspirin; Latex; and Nsaids   Social History:  The patient  reports that she has been smoking Cigarettes.  She has a 30.00 pack-year smoking history. She has never used smokeless tobacco. She reports that she drinks alcohol. She reports that she does not use drugs.   Family History:  The patient's family history includes Bladder Cancer in her cousin; Breast cancer in her maternal aunt, mother, and sister; Clotting disorder in her sister; Colon  polyps in her brother; Diabetes in her maternal grandmother, mother, and sister; Heart attack in her brother, father, and sister; Heart disease in her father; Pancreatic cancer in her brother; Prostate cancer in her brother.   ROS:  Please see the history of present illness.  All other systems are reviewed and otherwise negative.   PHYSICAL EXAM:  VS:  BP (!) 144/69   Pulse 61   Ht 5\' 7"  (1.702 m)   Wt 250 lb (113.4 kg)   BMI 39.16 kg/m  BMI: Body mass index is 39.16 kg/m. Well nourished, well developed, in no acute distress  HEENT: normocephalic, atraumatic  Neck: no JVD, carotid bruits or masses Cardiac:  RRR; no  significant murmurs, no rubs, or gallops Lungs:  CTA b/l, no wheezing, rhonchi or rales  Abd: soft, nontender MS: no deformity or atrophy Ext: 1+ edema (chronically per the patient) Skin: warm and dry, no rash Neuro:  No gross deficits appreciated Psych: euthymic mood, full affect  PPM site is stable, no tethering or discomfort   EKG:  Done today and reviewed by myself: AV paced PPM interrogation done today by industry and reviewed by myself:  Battery and RV lead testing is stable. A lead has had brief noise episodes, threshold up/down, today's similar to Dec 2017, P sensing similar to last week, lower in general She has CHB underlying  08/02/16: TTE Study Conclusions - Left ventricle: The cavity size was normal. Wall thickness was   increased in a pattern of mild LVH. Systolic function was normal.   The estimated ejection fraction was in the range of 60% to 65%.   Wall motion was normal; there were no regional wall motion   abnormalities. - Mitral valve: Mildly to moderately calcified annulus. - Left atrium: The atrium was mildly dilated. - Right atrium: The atrium was mildly dilated. - Pulmonary arteries: Systolic pressure was mildly increased. PA   peak pressure: 36 mm Hg (S).  Recent Labs: 08/01/2016: B Natriuretic Peptide 66.3 08/04/2016: ALT  13 04/23/2017: Magnesium 2.0; TSH 6.855 04/25/2017: BUN 21; Creatinine, Ser 1.33; Hemoglobin 10.7; Platelets 301; Potassium 3.9; Sodium 136  08/02/2016: Cholesterol 178; HDL 42; LDL Cholesterol 90; Total CHOL/HDL Ratio 4.2; Triglycerides 229; VLDL 46   Estimated Creatinine Clearance: 47.5 mL/min (A) (by C-G formula based on SCr of 1.33 mg/dL (H)).   Wt Readings from Last 3 Encounters:  05/03/17 250 lb (113.4 kg)  04/25/17 246 lb (111.6 kg)  04/22/17 246 lb (111.6 kg)     Other studies reviewed: Additional studies/records reviewed today include: summarized above  ASSESSMENT AND PLAN:  1. Paroxysmal Afib, ATach     CHA2DS2Vasc is at least 5, on Eliquis (appropriately dosed at 5mg  BID)     Flecainide/Toprol     SR today, only one 81minute episode since ER visit  2. HTN     Elevated her but she did not take her metoprolol.losartan this AM     She reports low BP at home     Stop losartan, continue the metoprolol with her Flecainide discussed importance of not skipping her metoprolol with the flecainide.      She will keep an eye on her BP and notify if remains low  3. PPM     A lead noise     Reproducible with L arm movement, not with pocket manipulation, impedance is stable     A lead sensing/thresholds appear to wax/wane\      AMS entry 65,[id number] since 03/11/16 (simiar to prior check)     I can not find history of A lead noise known   4. HFpEF     Weight appears stable, she reports same at home     discussed minimizing sodium, she reports she does.   Disposition:  She sees Dr. [name] in a week or so.  I will review with Dr. Rayann Heman, pacer interrogation/findings.  Current medicines are reviewed at length with the patient today.  The patient did not have any concerns regarding medicines.  Haywood Lasso, PA-C 05/03/2017 12:46 PM     [address] [place] [id number] [phone] (office)  [phone] (fax)

## 2017-05-03 ENCOUNTER — Ambulatory Visit (INDEPENDENT_AMBULATORY_CARE_PROVIDER_SITE_OTHER): Payer: Medicare Other | Admitting: Physician Assistant

## 2017-05-03 VITALS — BP 144/69 | HR 61 | Ht 67.0 in | Wt 250.0 lb

## 2017-05-03 DIAGNOSIS — I480 Paroxysmal atrial fibrillation: Secondary | ICD-10-CM | POA: Diagnosis not present

## 2017-05-03 DIAGNOSIS — I471 Supraventricular tachycardia: Secondary | ICD-10-CM

## 2017-05-03 DIAGNOSIS — I5030 Unspecified diastolic (congestive) heart failure: Secondary | ICD-10-CM | POA: Diagnosis not present

## 2017-05-03 DIAGNOSIS — Z950 Presence of cardiac pacemaker: Secondary | ICD-10-CM

## 2017-05-03 DIAGNOSIS — I2510 Atherosclerotic heart disease of native coronary artery without angina pectoris: Secondary | ICD-10-CM

## 2017-05-03 DIAGNOSIS — Z79899 Other long term (current) drug therapy: Secondary | ICD-10-CM

## 2017-05-03 NOTE — Patient Instructions (Addendum)
Medication Instructions:   STOP TAKING LOSARTAN  25 MG TWICE A DAY   If you need a refill on your cardiac medications before your next appointment, please call your pharmacy.  Labwork: NONE ORDERED  TODAY   Testing/Procedures: NONE ORDERED  TODAY    Follow-Up: IN ONE MONTH WITH DR ALLRED ( ONLY OR MESSAGE HIS NURSE )   Any Other Special Instructions Will Be Listed Below (If Applicable).

## 2017-05-03 NOTE — Addendum Note (Signed)
Addended by: Claude Manges on: 05/03/2017 01:46 PM   Modules accepted: Orders

## 2017-05-08 NOTE — Progress Notes (Signed)
Cardiology Office Note    Date:  05/09/2017   ID:  XAN SPARKMAN, DOB 03/12/1941, MRN [id number]  PCP:  Hoyt Koch, MD  Cardiologist: Sinclair Grooms, MD   Chief Complaint  Patient presents with  . Atrial Fibrillation  . Congestive Heart Failure  . Shortness of Breath    History of Present Illness:  KALIFA CADDEN is a 76 y.o. female with a history of chronic diastolic CHF, tachy-brady syndrome s/p PPM (St. Jude), PAF on Eliquis, DVT ( on Eliquis), HTN, HLD, DM, OSA, non obstructive by cath (01/2015) who presents to clinic for post hospital follow up.   Feels relatively well. Recently found to have increasingly frequent episodes of paroxysmal atrial fibrillation. She is been to the emergency room twice because of feeling weak and noticing rapid heart rate. No associated chest pain, orthopnea, or PND. She denies lower extremity swelling. No episodes of angina or nitroglycerin use. She has been seen by both Dr. Rayann Heman and Georgeanna Harrison [facility] related to rhythm.   Past Medical History:  Diagnosis Date  . Arthritis   . Atrial tachycardia ([employer]) 04/23/2017  . CAD (coronary artery disease)    non obstructive  . 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 Procedure complicated by an atrial lead dislodgment and repeat procedure, 12/2013   . Carotid stenosis    Carotid US 5/16:  Bilateral ICA 1-39%; L vertebral retrograde; L BP 126/49, R BP 140/57  . CHF (congestive heart failure) ([place])   . Chronic lower back pain   . Colon cancer ([place])    a. s/p chemo  . Colon polyps   . Diabetic peripheral neuropathy ([place])   . DVT (deep venous thrombosis) ([place]) 2013   "twice behind knee on left side" (06/25/2013)  . Glaucoma   . Hyperlipidemia   . Hypertension   . Hypothyroidism   . Iron deficiency anemia   . Multinodular goiter   . Sleep apnea    a. resolved post weight loss   . Type II diabetes mellitus ([place])     Past Surgical  History:  Procedure Laterality Date  . CARDIAC CATHETERIZATION  2006   Never had PCI, 3 caths total. Last one in [place]  . CARDIAC CATHETERIZATION N/A 02/04/2015   Procedure: Left Heart Cath and Coronary Angiography;  Surgeon: Belva Crome, MD;  Location: [place] CV LAB;  Service: Cardiovascular;  Laterality: N/A;  . CATARACT EXTRACTION, BILATERAL Bilateral 2013   "and put stent in my left eye for glaucoma" (06/25/2013)  . CHOLECYSTECTOMY  1980's  . COLECTOMY  10/2010   Tumor removal  . EYE MUSCLE SURGERY Bilateral ~ 1963   "muscles too long; eyes would go out and up; tied muscles to hold my eyes straight" (06/25/2013)  . LEAD REVISION  01-03-2014   atrial lead revision by Dr Rayann Heman  . LEAD REVISION N/A 01/03/2014   Procedure: LEAD REVISION;  Surgeon: Coralyn Mark, MD;  Location: [place] CATH LAB;  Service: Cardiovascular;  Laterality: N/A;  . PACEMAKER INSERTION  01/02/2014   STJ Assurity dual chamber pacemaker implnated by Dr Lovena Le for SSS  . PERMANENT PACEMAKER INSERTION N/A 01/02/2014   Procedure: PERMANENT PACEMAKER INSERTION;  Surgeon: Evans Lance, MD;  Location: [facility] CATH LAB;  Service: Cardiovascular;  Laterality: N/A;  . PORTACATH PLACEMENT Left 11/2010  . ROUX-EN-Y GASTRIC BYPASS  2009  . ULNAR TUNNEL RELEASE Left ~ 2008  . UMBILICAL  HERNIA REPAIR  1970's?    (06/25/2013)    Current Medications: Outpatient Medications Prior to Visit  Medication Sig Dispense Refill  . atorvastatin (LIPITOR) 80 MG tablet TAKE 1 TABLET BY MOUTH ONCE DAILY (Patient taking differently: TAKE 80 MG BY MOUTH AT BEDTIME) 30 tablet 1  . bimatoprost (LUMIGAN) 0.01 % SOLN Place 1 drop into the right eye at bedtime.     Marland Kitchen buPROPion (WELLBUTRIN XL) 150 MG 24 hr tablet take 1 tablet by mouth daily 30 tablet 6  . ELIQUIS 5 MG TABS tablet TAKE 1 TABLET BY MOUTH TWICE A DAY 180 tablet 3  . flecainide (TAMBOCOR) 50 MG tablet Take 1 tablet (50 mg total) by mouth 2 (two) times daily. 60 tablet 0  . furosemide  (LASIX) 80 MG tablet Take 1 tablet (80 mg total) by mouth daily. 30 tablet 9  . gabapentin (NEURONTIN) 300 MG capsule Take 300 mg by mouth at bedtime.     Marland Kitchen HYDROcodone-acetaminophen (NORCO/VICODIN) 5-325 MG tablet Take 1 tablet by mouth every 6 (six) hours as needed for moderate pain. 45 tablet 0  . insulin glargine (LANTUS) 100 unit/mL SOPN Inject 36 Units into the skin at bedtime.     Marland Kitchen levothyroxine (SYNTHROID, LEVOTHROID) 112 MCG tablet Take 112 mcg by mouth daily before breakfast.    . metoprolol succinate (TOPROL-XL) 25 MG 24 hr tablet Take 1 tablet (25 mg total) by mouth daily. 30 tablet 0  . nitroGLYCERIN (NITROSTAT) 0.4 MG SL tablet Place 1 tablet (0.4 mg total) under the tongue every 5 (five) minutes as needed for chest pain. 90 tablet 3  . potassium chloride SA (K-DUR,KLOR-CON) 20 MEQ tablet Take 2 tablets (40 mEq total) by mouth once. (Patient taking differently: Take 40 mEq by mouth at bedtime. ) 180 tablet 3   No facility-administered medications prior to visit.      Allergies:   Other; Adhesive [tape]; Aspirin; Latex; and Nsaids   Social History   Social History  . Marital status: Married    Spouse name: [name]  . Number of children: 0  . Years of education: N/A   Occupational History  . Retired     office work   Social History Main Topics  . Smoking status: Light Tobacco Smoker    Packs/day: 1.00    Years: 30.00    Types: Cigarettes    Last attempt to quit: 05/03/2013  . Smokeless tobacco: Never Used     Comment: 06/25/2013 "smoked off and on since I was 22; stopped 10/2007 til 08/30/2012 then started again cause of stress"  . Alcohol use 0.0 oz/week     Comment: 06/25/2013 "glass of wine 1-2 times/yr"  . Drug use: No  . Sexual activity: Yes   Other Topics Concern  . None   Social History Narrative   Married to husband, [name]   No children   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   Recently moved back here from [place]      Family  History:  The patient's family history includes Bladder Cancer in her cousin; Breast cancer in her maternal aunt, mother, and sister; Clotting disorder in her sister; Colon polyps in her brother; Diabetes in her maternal grandmother, mother, and sister; Heart attack in her brother, father, and sister; Heart disease in her father; Pancreatic cancer in her brother; Prostate cancer in her brother.   ROS:   Please see the history of present illness.    Has finally discontinued  cigarette smoking now for greater than one month. Has dyspnea on exertion, PND, orthopnea, leg swelling, leg pain, palpitations, excessive fatigue, and constipation.  All other systems reviewed and are negative.   PHYSICAL EXAM:   VS:  BP 100/68 (BP Location: Left Arm)   Pulse 90   Ht 5\' 7"  (1.702 m)   Wt 248 lb 12.8 oz (112.9 kg)   BMI 38.97 kg/m    GEN: Well nourished, well developed, in no acute distress  HEENT: normal  Neck: no JVD, carotid bruits, or masses Cardiac: Irregular RR at 90 bpm. 1 to 2/6 systolic murmur, but no rubs, or gallops,no edema . Respiratory:  clear to auscultation bilaterally, normal work of breathing GI: soft, nontender, nondistended, + BS MS: no deformity or atrophy  Skin: warm and dry, no rash Neuro:  Alert and Oriented x 3, Strength and sensation are intact Psych: euthymic mood, full affect  Wt Readings from Last 3 Encounters:  05/09/17 248 lb 12.8 oz (112.9 kg)  05/03/17 250 lb (113.4 kg)  04/25/17 246 lb (111.6 kg)      Studies/Labs Reviewed:   EKG:  EKG  Not repeated  Recent Labs: 08/01/2016: B Natriuretic Peptide 66.3 08/04/2016: ALT 13 04/23/2017: Magnesium 2.0; TSH 6.855 04/25/2017: BUN 21; Creatinine, Ser 1.33; Hemoglobin 10.7; Platelets 301; Potassium 3.9; Sodium 136   Lipid Panel    Component Value Date/Time   CHOL 178 08/02/2016 0102   TRIG 229 (H) 08/02/2016 0102   HDL 42 08/02/2016 0102   CHOLHDL 4.2 08/02/2016 0102   VLDL 46 (H) 08/02/2016 0102   LDLCALC 90  08/02/2016 0102   LDLDIRECT 79.0 03/23/2016 1529    Additional studies/ records that were reviewed today include:  No new data    ASSESSMENT:    1. PAF (paroxysmal atrial fibrillation) ([place])   2. Essential hypertension   3. Coronary artery disease involving native coronary artery of native heart without angina pectoris   4. Chronic diastolic heart failure ([place])   5. Second degree AV block, Mobitz type II   6. Cardiac pacemaker in situ - St Jude      PLAN:  In order of problems listed above:  1. She is in atrial dysrhythmia currently. Heart rate is been a control than when last seen in the emergency department. Flecainide has been started. She will follow-up with Dr. Rayann Heman within the next month or so. No adjustments were made since the heart rate was less than 100 bpm. Continue chronic anticoagulation to prevent stroke. 2. No change in therapy. 3. Asymptomatic with reference to angina. 4. No obvious clinical evidence of volume overload. 5. Pacemaker therapy with normal pacer function and recent reprogramming. 6. Note #5 above.  Clinical follow-up is months for diastolic heart failure and coronary artery disease. Atrial fibrillation management is now per the A. fib clinic/device    Medication Adjustments/Labs and Tests Ordered: Current medicines are reviewed at length with the patient today.  Concerns regarding medicines are outlined above.  Medication changes, Labs and Tests ordered today are listed in the Patient Instructions below. Patient Instructions  Medication Instructions:  None  Labwork: None  Testing/Procedures: None  Follow-Up: Your physician wants you to follow-up in: 6 months with Dr. [name].  You will receive a reminder letter in the mail two months in advance. If you don't receive a letter, please call our office to schedule the follow-up appointment.     Any Other Special Instructions Will Be Listed Below (If Applicable).  If you need a refill  on your cardiac medications before your next appointment, please call your pharmacy.      Signed, Sinclair Grooms, MD  05/09/2017 11:40 AM    [facility] [place], [place], [place]  [address] Phone: [phone]; Fax: [phone]

## 2017-05-09 ENCOUNTER — Ambulatory Visit (INDEPENDENT_AMBULATORY_CARE_PROVIDER_SITE_OTHER): Payer: Medicare Other | Admitting: Interventional Cardiology

## 2017-05-09 ENCOUNTER — Encounter: Payer: Self-pay | Admitting: Interventional Cardiology

## 2017-05-09 VITALS — BP 100/68 | HR 90 | Ht 67.0 in | Wt 248.8 lb

## 2017-05-09 DIAGNOSIS — I441 Atrioventricular block, second degree: Secondary | ICD-10-CM | POA: Diagnosis not present

## 2017-05-09 DIAGNOSIS — I2510 Atherosclerotic heart disease of native coronary artery without angina pectoris: Secondary | ICD-10-CM

## 2017-05-09 DIAGNOSIS — Z950 Presence of cardiac pacemaker: Secondary | ICD-10-CM | POA: Diagnosis not present

## 2017-05-09 DIAGNOSIS — I5032 Chronic diastolic (congestive) heart failure: Secondary | ICD-10-CM

## 2017-05-09 DIAGNOSIS — I10 Essential (primary) hypertension: Secondary | ICD-10-CM

## 2017-05-09 DIAGNOSIS — I480 Paroxysmal atrial fibrillation: Secondary | ICD-10-CM

## 2017-05-09 NOTE — Patient Instructions (Signed)
Medication Instructions:  None  Labwork: None  Testing/Procedures: None  Follow-Up: Your physician wants you to follow-up in: 6 months with Dr. Smith.  You will receive a reminder letter in the mail two months in advance. If you don't receive a letter, please call our office to schedule the follow-up appointment.   Any Other Special Instructions Will Be Listed Below (If Applicable).     If you need a refill on your cardiac medications before your next appointment, please call your pharmacy.   

## 2017-05-16 ENCOUNTER — Telehealth: Payer: Self-pay | Admitting: Physician Assistant

## 2017-05-16 NOTE — Telephone Encounter (Signed)
Opened in error

## 2017-05-23 ENCOUNTER — Ambulatory Visit (INDEPENDENT_AMBULATORY_CARE_PROVIDER_SITE_OTHER): Payer: Medicare Other | Admitting: Internal Medicine

## 2017-05-23 ENCOUNTER — Other Ambulatory Visit: Payer: Medicare Other

## 2017-05-23 ENCOUNTER — Encounter: Payer: Self-pay | Admitting: Internal Medicine

## 2017-05-23 VITALS — BP 130/80 | HR 60 | Temp 98.3°F | Ht 67.0 in | Wt 258.0 lb

## 2017-05-23 DIAGNOSIS — M150 Primary generalized (osteo)arthritis: Secondary | ICD-10-CM | POA: Diagnosis not present

## 2017-05-23 DIAGNOSIS — I2510 Atherosclerotic heart disease of native coronary artery without angina pectoris: Secondary | ICD-10-CM

## 2017-05-23 DIAGNOSIS — M159 Polyosteoarthritis, unspecified: Secondary | ICD-10-CM

## 2017-05-23 DIAGNOSIS — M8949 Other hypertrophic osteoarthropathy, multiple sites: Secondary | ICD-10-CM

## 2017-05-23 DIAGNOSIS — C189 Malignant neoplasm of colon, unspecified: Secondary | ICD-10-CM

## 2017-05-23 DIAGNOSIS — Z23 Encounter for immunization: Secondary | ICD-10-CM | POA: Diagnosis not present

## 2017-05-23 MED ORDER — HYDROCODONE-ACETAMINOPHEN 5-325 MG PO TABS: 1.0000 | ORAL_TABLET | ORAL | 0 refills | Status: DC | PRN

## 2017-05-23 NOTE — Patient Instructions (Signed)
We have given you the refill of the hydrocodone.

## 2017-05-24 LAB — CEA: CEA: 0.8 ng/mL

## 2017-05-25 ENCOUNTER — Other Ambulatory Visit: Payer: Self-pay | Admitting: Family

## 2017-05-25 DIAGNOSIS — M1990 Unspecified osteoarthritis, unspecified site: Secondary | ICD-10-CM | POA: Insufficient documentation

## 2017-05-25 NOTE — Progress Notes (Signed)
   Subjective:    Patient ID: Mary [name], female    DOB: Oct 21, 1940, 76 y.o.   MRN: [id number]  HPI The patient is a 76 YO female coming in for her arthritis pain. She denies injury or fall recently. She had previous vicodin left over from surgery and has used sparingly over the last several years. Would like to have a small amount of that available if needed. Will use tylenol if the pain is mild. She denies using more than daily ever. She is able to function better with the medication.   Review of Systems  Constitutional: Positive for activity change. Negative for appetite change, chills, fever and unexpected weight change.  Respiratory: Negative for cough, chest tightness and shortness of breath.   Cardiovascular: Negative for chest pain, palpitations and leg swelling.  Gastrointestinal: Negative for abdominal distention, abdominal pain, constipation, diarrhea, nausea and vomiting.  Musculoskeletal: Positive for arthralgias. Negative for back pain, gait problem, joint swelling, myalgias, neck pain and neck stiffness.  Skin: Negative.   Neurological: Negative.   Psychiatric/Behavioral: Negative.       Objective:   Physical Exam  Constitutional: She is oriented to person, place, and time. She appears well-developed and well-nourished.  HENT:  Head: Normocephalic and atraumatic.  Eyes: EOM are normal.  Neck: Normal range of motion.  Cardiovascular: Normal rate and regular rhythm.   Pulmonary/Chest: Effort normal and breath sounds normal. No respiratory distress. She has no wheezes. She has no rales.  Abdominal: Soft. Bowel sounds are normal. She exhibits no distension. There is no tenderness. There is no rebound.  Musculoskeletal: She exhibits no edema.  Neurological: She is alert and oriented to person, place, and time. Coordination normal.  Skin: Skin is warm and dry.  Psychiatric: She has a normal mood and affect.   Vitals:   05/23/17 1528  BP: 130/80  Pulse: 60  Temp: 98.3  F (36.8 C)  TempSrc: Oral  SpO2: 100%  Weight: 258 lb (117 kg)  Height: 5\' 7"  (1.702 m)      Assessment & Plan:  Flu shot given at visit.

## 2017-05-25 NOTE — Assessment & Plan Note (Addendum)
Rx for hydrocodone small amount for functionality. She is not on these chronically so only 5 day rx provided. [place] narcotic database reviewed and no fills.

## 2017-05-27 ENCOUNTER — Encounter: Payer: Self-pay | Admitting: Internal Medicine

## 2017-05-31 ENCOUNTER — Other Ambulatory Visit: Payer: Self-pay | Admitting: *Deleted

## 2017-05-31 MED ORDER — METOPROLOL SUCCINATE ER 25 MG PO TB24: 25.0000 mg | ORAL_TABLET | Freq: Every day | ORAL | 10 refills | Status: DC

## 2017-05-31 MED ORDER — FLECAINIDE ACETATE 50 MG PO TABS: 50.0000 mg | ORAL_TABLET | Freq: Two times a day (BID) | ORAL | 10 refills | Status: DC

## 2017-06-06 ENCOUNTER — Encounter: Payer: Self-pay | Admitting: Internal Medicine

## 2017-06-06 ENCOUNTER — Ambulatory Visit (INDEPENDENT_AMBULATORY_CARE_PROVIDER_SITE_OTHER): Payer: Medicare Other | Admitting: Internal Medicine

## 2017-06-06 VITALS — BP 116/78 | HR 75 | Ht 67.0 in | Wt 251.6 lb

## 2017-06-06 DIAGNOSIS — I471 Supraventricular tachycardia: Secondary | ICD-10-CM

## 2017-06-06 DIAGNOSIS — I480 Paroxysmal atrial fibrillation: Secondary | ICD-10-CM

## 2017-06-06 DIAGNOSIS — I442 Atrioventricular block, complete: Secondary | ICD-10-CM

## 2017-06-06 DIAGNOSIS — I2510 Atherosclerotic heart disease of native coronary artery without angina pectoris: Secondary | ICD-10-CM

## 2017-06-06 DIAGNOSIS — I10 Essential (primary) hypertension: Secondary | ICD-10-CM | POA: Diagnosis not present

## 2017-06-06 MED ORDER — METOPROLOL SUCCINATE ER 25 MG PO TB24: 25.0000 mg | ORAL_TABLET | Freq: Every day | ORAL | 3 refills | Status: DC

## 2017-06-06 NOTE — Progress Notes (Signed)
PCP: Hoyt Koch, MD Primary Cardiologist:  Dr [name] Primary EP:  Dr Rayann Heman  Mary [name] is a 76 y.o. female who presents today for routine electrophysiology followup.  Since last being seen by Tommye Standard, the patient reports doing reasonably well.  She does not sleep well.  She is typically up all night and sleeps during the day.  She has not discussed this with her PCP.  I have encouraged that she do so.  Today, she denies symptoms of palpitations, chest pain, shortness of breath,  lower extremity edema, dizziness, presyncope, or syncope.  The patient is otherwise without complaint today.   Past Medical History:  Diagnosis Date  . Arthritis   . Atrial tachycardia ([place]) 04/23/2017  . CAD (coronary artery disease)    non obstructive  . 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 Procedure complicated by an atrial lead dislodgment and repeat procedure, 12/2013   . Carotid stenosis    Carotid US 5/16:  Bilateral ICA 1-39%; L vertebral retrograde; L BP 126/49, R BP 140/57  . CHF (congestive heart failure) ([place])   . Chronic lower back pain   . Colon cancer ([place])    a. s/p chemo  . Colon polyps   . Diabetic peripheral neuropathy ([place])   . DVT (deep venous thrombosis) ([place]) 2013   "twice behind knee on left side" (06/25/2013)  . Glaucoma   . Hyperlipidemia   . Hypertension   . Hypothyroidism   . Iron deficiency anemia   . Multinodular goiter   . Sleep apnea    a. resolved post weight loss   . Type II diabetes mellitus ([place])    Past Surgical History:  Procedure Laterality Date  . CARDIAC CATHETERIZATION  2006   Never had PCI, 3 caths total. Last one in [place]  . CARDIAC CATHETERIZATION N/A 02/04/2015   Procedure: Left Heart Cath and Coronary Angiography;  Surgeon: Belva Crome, MD;  Location: [place] CV LAB;  Service: Cardiovascular;  Laterality: N/A;  . CATARACT EXTRACTION, BILATERAL Bilateral 2013   "and  put stent in my left eye for glaucoma" (06/25/2013)  . CHOLECYSTECTOMY  1980's  . COLECTOMY  10/2010   Tumor removal  . EYE MUSCLE SURGERY Bilateral ~ 1963   "muscles too long; eyes would go out and up; tied muscles to hold my eyes straight" (06/25/2013)  . LEAD REVISION  01-03-2014   atrial lead revision by Dr Rayann Heman  . LEAD REVISION N/A 01/03/2014   Procedure: LEAD REVISION;  Surgeon: Coralyn Mark, MD;  Location: [place] CATH LAB;  Service: Cardiovascular;  Laterality: N/A;  . PACEMAKER INSERTION  01/02/2014   STJ Assurity dual chamber pacemaker implnated by Dr Lovena Le for SSS  . PERMANENT PACEMAKER INSERTION N/A 01/02/2014   Procedure: PERMANENT PACEMAKER INSERTION;  Surgeon: Evans Lance, MD;  Location: [facility] CATH LAB;  Service: Cardiovascular;  Laterality: N/A;  . PORTACATH PLACEMENT Left 11/2010  . ROUX-EN-Y GASTRIC BYPASS  2009  . ULNAR TUNNEL RELEASE Left ~ 2008  . UMBILICAL HERNIA REPAIR  1970's?    (06/25/2013)    ROS- all systems are reviewed and negative except as per HPI above  Current Outpatient Prescriptions  Medication Sig Dispense Refill  . atorvastatin (LIPITOR) 80 MG tablet TAKE 1 TABLET BY MOUTH EVERY DAY 30 tablet 5  . bimatoprost (LUMIGAN) 0.01 % SOLN Place 1 drop into the right eye at bedtime.     Marland Kitchen  buPROPion (WELLBUTRIN XL) 150 MG 24 hr tablet take 1 tablet by mouth daily 30 tablet 6  . ELIQUIS 5 MG TABS tablet TAKE 1 TABLET BY MOUTH TWICE A DAY 180 tablet 3  . flecainide (TAMBOCOR) 50 MG tablet Take 1 tablet (50 mg total) by mouth 2 (two) times daily. 60 tablet 10  . furosemide (LASIX) 80 MG tablet Take 1 tablet (80 mg total) by mouth daily. 30 tablet 9  . gabapentin (NEURONTIN) 300 MG capsule Take 300 mg by mouth at bedtime.     Marland Kitchen HYDROcodone-acetaminophen (NORCO/VICODIN) 5-325 MG tablet Take 1 tablet by mouth every 4 (four) hours as needed for moderate pain. 30 tablet 0  . insulin glargine (LANTUS) 100 unit/mL SOPN Inject 36 Units into the skin at bedtime.     Marland Kitchen  levothyroxine (SYNTHROID, LEVOTHROID) 112 MCG tablet Take 112 mcg by mouth daily before breakfast.    . metoprolol succinate (TOPROL-XL) 25 MG 24 hr tablet Take 1 tablet (25 mg total) by mouth daily. 30 tablet 10  . nitroGLYCERIN (NITROSTAT) 0.4 MG SL tablet Place 1 tablet (0.4 mg total) under the tongue every 5 (five) minutes as needed for chest pain. 90 tablet 3  . potassium chloride SA (K-DUR,KLOR-CON) 20 MEQ tablet Take 40 mEq by mouth at bedtime.  1   No current facility-administered medications for this visit.     Physical Exam: Vitals:   06/06/17 1636  BP: 116/78  Pulse: 75  SpO2: 98%  Weight: 251 lb 9.6 oz (114.1 kg)  Height: 5\' 7"  (1.702 m)    GEN- The patient is overweight appearing, alert and oriented x 3 today.   Head- normocephalic, atraumatic Eyes-  Sclera clear, conjunctiva pink Ears- hearing intact Oropharynx- clear Lungs- Clear to ausculation bilaterally, normal work of breathing Chest- pacemaker pocket is well healed Heart- Regular rate and rhythm, no murmurs, rubs or gallops, PMI not laterally displaced GI- soft, NT, ND, + BS Extremities- no clubbing, cyanosis, or edema  Pacemaker interrogation- reviewed in detail today,  See PACEART report  ekg tracing ordered today is personally reviewed and shows sinus rhythm with V pacing  Assessment and Plan:  1. Symptomatic complete heart block Normal pacemaker function See Pace Art report No changes today  2. Ectopic atrial tachycardia Improved with flecainide No changes today  3. HTN Stable No change required today  4. Obesity  Body mass index is 39.41 kg/m. She has not made progress with lifestyle modification  5. afib On eliquis Well controlled  [place] Return to see EP NP in 6 months  [name] MD, [facility] 06/06/2017 4:56 PM

## 2017-06-06 NOTE — Patient Instructions (Addendum)
Medication Instructions:  Your physician has recommended you make the following change in your medication:  1) Start taking your Metoprolol at night   Labwork: None ordered   Testing/Procedures: None ordered   Follow-Up: Your physician wants you to follow-up in: 6 months with Chanetta Marshall, NP. You will receive a reminder letter in the mail two months in advance. If you don't receive a letter, please call our office to schedule the follow-up appointment.  Remote monitoring is used to monitor your Pacemaker from home. This monitoring reduces the number of office visits required to check your device to one time per year. It allows [place] to keep an eye on the functioning of your device to ensure it is working properly. You are scheduled for a device check from home on 06/20/17. You may send your transmission at any time that day. If you have a wireless device, the transmission will be sent automatically. After your physician reviews your transmission, you will receive a postcard with your next transmission date.   Any Other Special Instructions Will Be Listed Below (If Applicable).     If you need a refill on your cardiac medications before your next appointment, please call your pharmacy.

## 2017-06-07 LAB — CUP PACEART INCLINIC DEVICE CHECK
Battery Voltage: 2.99 V
Date Time Interrogation Session: 20181009091003
Implantable Lead Implant Date: 20150506
Implantable Lead Location: 753859
Implantable Lead Location: 753860
Implantable Pulse Generator Implant Date: 20150506
Lead Channel Impedance Value: 430 Ohm
Lead Channel Pacing Threshold Amplitude: 0.875 V
Lead Channel Pacing Threshold Amplitude: 1.25 V
Lead Channel Pacing Threshold Pulse Width: 0.4 ms
Lead Channel Sensing Intrinsic Amplitude: 10.9 mV
Lead Channel Setting Sensing Sensitivity: 4 mV
MDC IDC LEAD IMPLANT DT: 20150506
MDC IDC MSMT LEADCHNL RA IMPEDANCE VALUE: 360 Ohm
MDC IDC MSMT LEADCHNL RA SENSING INTR AMPL: 0.6 mV
MDC IDC MSMT LEADCHNL RV PACING THRESHOLD PULSEWIDTH: 0.4 ms
MDC IDC PG SERIAL: 3013730
MDC IDC SET LEADCHNL RA PACING AMPLITUDE: 2.5 V
MDC IDC SET LEADCHNL RV PACING AMPLITUDE: 0.875
MDC IDC SET LEADCHNL RV PACING PULSEWIDTH: 0.4 ms
Pulse Gen Model: 2240

## 2017-06-13 ENCOUNTER — Telehealth (HOSPITAL_COMMUNITY): Payer: Self-pay | Admitting: *Deleted

## 2017-06-13 NOTE — Telephone Encounter (Signed)
Patient called in stating she went into afib about of 5 hours and said she was instructed by Dr. Rayann Heman if this happens to call afib clinic to be "paced out".  Pt states her HR is in the 90s but BP is in the 88/63 range slightly dizzy but otherwise asymptomatic. Instructed patient to drink extra glass of water for blood pressure - take her medications as normal tonight and see if this will aid with return to NSR. Appt made for Wednesday per pt request - she will call and cancel if she returns to [facility] before then.

## 2017-06-15 ENCOUNTER — Inpatient Hospital Stay (HOSPITAL_COMMUNITY): Admission: RE | Admit: 2017-06-15 | Payer: Medicare Other | Source: Ambulatory Visit | Admitting: Nurse Practitioner

## 2017-06-20 ENCOUNTER — Ambulatory Visit (INDEPENDENT_AMBULATORY_CARE_PROVIDER_SITE_OTHER): Payer: Medicare Other | Admitting: *Deleted

## 2017-06-20 DIAGNOSIS — I442 Atrioventricular block, complete: Secondary | ICD-10-CM | POA: Diagnosis not present

## 2017-06-20 NOTE — Progress Notes (Signed)
Remote pacemaker transmission.   

## 2017-06-22 LAB — CUP PACEART REMOTE DEVICE CHECK
Battery Remaining Longevity: 112 mo
Battery Remaining Percentage: 95.5 %
Battery Voltage: 2.99 V
Brady Statistic AP VP Percent: 53 %
Brady Statistic AP VS Percent: 1 %
Brady Statistic AS VP Percent: 47 %
Brady Statistic AS VS Percent: 1 %
Brady Statistic RA Percent Paced: 52 %
Brady Statistic RV Percent Paced: 99 %
Date Time Interrogation Session: 20181022080034
Implantable Lead Implant Date: 20150506
Implantable Lead Implant Date: 20150506
Implantable Lead Location: 753859
Implantable Lead Location: 753860
Implantable Pulse Generator Implant Date: 20150506
Lead Channel Impedance Value: 350 Ohm
Lead Channel Impedance Value: 400 Ohm
Lead Channel Pacing Threshold Amplitude: 0.75 V
Lead Channel Pacing Threshold Amplitude: 1.25 V
Lead Channel Pacing Threshold Pulse Width: 0.4 ms
Lead Channel Pacing Threshold Pulse Width: 0.4 ms
Lead Channel Sensing Intrinsic Amplitude: 0.3 mV
Lead Channel Sensing Intrinsic Amplitude: 10.9 mV
Lead Channel Setting Pacing Amplitude: 1 V
Lead Channel Setting Pacing Amplitude: 2.5 V
Lead Channel Setting Pacing Pulse Width: 0.4 ms
Lead Channel Setting Sensing Sensitivity: 4 mV
Pulse Gen Model: 2240
Pulse Gen Serial Number: 3013730

## 2017-06-24 ENCOUNTER — Encounter: Payer: Self-pay | Admitting: Cardiology

## 2017-08-04 ENCOUNTER — Other Ambulatory Visit: Payer: Self-pay

## 2017-08-14 ENCOUNTER — Other Ambulatory Visit: Payer: Self-pay | Admitting: Physician Assistant

## 2017-08-26 ENCOUNTER — Encounter: Payer: Self-pay | Admitting: Interventional Cardiology

## 2017-08-26 MED ORDER — NITROGLYCERIN 0.4 MG SL SUBL: 0.4000 mg | SUBLINGUAL_TABLET | SUBLINGUAL | 2 refills | Status: DC | PRN

## 2017-08-26 NOTE — Telephone Encounter (Signed)
Pt's medication was sent to pt's pharmacy as requested. Confirmation received.  °

## 2017-09-15 ENCOUNTER — Other Ambulatory Visit: Payer: Self-pay | Admitting: Internal Medicine

## 2017-09-19 ENCOUNTER — Ambulatory Visit (INDEPENDENT_AMBULATORY_CARE_PROVIDER_SITE_OTHER): Payer: Medicare Other | Admitting: *Deleted

## 2017-09-19 DIAGNOSIS — I442 Atrioventricular block, complete: Secondary | ICD-10-CM

## 2017-09-19 NOTE — Progress Notes (Signed)
Remote pacemaker transmission.   

## 2017-09-20 ENCOUNTER — Encounter: Payer: Self-pay | Admitting: Cardiology

## 2017-10-02 ENCOUNTER — Encounter: Payer: Self-pay | Admitting: Interventional Cardiology

## 2017-10-03 ENCOUNTER — Other Ambulatory Visit: Payer: Self-pay | Admitting: *Deleted

## 2017-10-03 MED ORDER — FUROSEMIDE 80 MG PO TABS: 80.0000 mg | ORAL_TABLET | Freq: Every day | ORAL | 6 refills | Status: DC

## 2017-10-15 LAB — CUP PACEART REMOTE DEVICE CHECK
Battery Remaining Longevity: 113 mo
Battery Remaining Percentage: 95.5 %
Battery Voltage: 2.98 V
Brady Statistic AP VP Percent: 65 %
Brady Statistic AS VS Percent: 1 %
Brady Statistic RA Percent Paced: 65 %
Implantable Lead Implant Date: 20150506
Implantable Lead Implant Date: 20150506
Implantable Lead Location: 753860
Implantable Pulse Generator Implant Date: 20150506
Lead Channel Impedance Value: 380 Ohm
Lead Channel Impedance Value: 430 Ohm
Lead Channel Pacing Threshold Amplitude: 1.25 V
Lead Channel Pacing Threshold Pulse Width: 0.4 ms
Lead Channel Pacing Threshold Pulse Width: 0.4 ms
Lead Channel Sensing Intrinsic Amplitude: 0.3 mV
Lead Channel Setting Pacing Amplitude: 1 V
MDC IDC LEAD LOCATION: 753859
MDC IDC MSMT LEADCHNL RV PACING THRESHOLD AMPLITUDE: 0.75 V
MDC IDC MSMT LEADCHNL RV SENSING INTR AMPL: 9.3 mV
MDC IDC PG SERIAL: 3013730
MDC IDC SESS DTM: 20190121090016
MDC IDC SET LEADCHNL RA PACING AMPLITUDE: 2.5 V
MDC IDC SET LEADCHNL RV PACING PULSEWIDTH: 0.4 ms
MDC IDC SET LEADCHNL RV SENSING SENSITIVITY: 4 mV
MDC IDC STAT BRADY AP VS PERCENT: 1 %
MDC IDC STAT BRADY AS VP PERCENT: 35 %
MDC IDC STAT BRADY RV PERCENT PACED: 99 %

## 2017-10-28 ENCOUNTER — Other Ambulatory Visit: Payer: Self-pay | Admitting: Internal Medicine

## 2017-11-02 ENCOUNTER — Ambulatory Visit (INDEPENDENT_AMBULATORY_CARE_PROVIDER_SITE_OTHER): Payer: Medicare Other | Admitting: Internal Medicine

## 2017-11-02 ENCOUNTER — Encounter: Payer: Self-pay | Admitting: Internal Medicine

## 2017-11-02 VITALS — BP 122/72 | HR 69 | Ht 67.0 in | Wt 250.0 lb

## 2017-11-02 DIAGNOSIS — I10 Essential (primary) hypertension: Secondary | ICD-10-CM

## 2017-11-02 DIAGNOSIS — I471 Supraventricular tachycardia: Secondary | ICD-10-CM | POA: Diagnosis not present

## 2017-11-02 DIAGNOSIS — I442 Atrioventricular block, complete: Secondary | ICD-10-CM | POA: Diagnosis not present

## 2017-11-02 DIAGNOSIS — I480 Paroxysmal atrial fibrillation: Secondary | ICD-10-CM | POA: Diagnosis not present

## 2017-11-02 DIAGNOSIS — Z950 Presence of cardiac pacemaker: Secondary | ICD-10-CM

## 2017-11-02 LAB — CUP PACEART INCLINIC DEVICE CHECK
Battery Remaining Longevity: 114 mo
Date Time Interrogation Session: 20190306172626
Implantable Lead Implant Date: 20150506
Implantable Lead Location: 753859
Implantable Pulse Generator Implant Date: 20150506
Lead Channel Pacing Threshold Amplitude: 0.75 V
Lead Channel Pacing Threshold Pulse Width: 0.4 ms
Lead Channel Setting Pacing Amplitude: 1 V
Lead Channel Setting Pacing Amplitude: 2.5 V
Lead Channel Setting Pacing Pulse Width: 0.4 ms
Lead Channel Setting Sensing Sensitivity: 4 mV
MDC IDC LEAD IMPLANT DT: 20150506
MDC IDC LEAD LOCATION: 753860
MDC IDC MSMT BATTERY VOLTAGE: 2.98 V
MDC IDC MSMT LEADCHNL RA IMPEDANCE VALUE: 375 Ohm
MDC IDC MSMT LEADCHNL RA PACING THRESHOLD AMPLITUDE: 1.25 V
MDC IDC MSMT LEADCHNL RA PACING THRESHOLD PULSEWIDTH: 0.4 ms
MDC IDC MSMT LEADCHNL RA SENSING INTR AMPL: 0.4 mV
MDC IDC MSMT LEADCHNL RV IMPEDANCE VALUE: 450 Ohm
MDC IDC MSMT LEADCHNL RV SENSING INTR AMPL: 7.4 mV
MDC IDC PG SERIAL: 3013730
MDC IDC STAT BRADY RA PERCENT PACED: 66 %
MDC IDC STAT BRADY RV PERCENT PACED: 99.92 %
Pulse Gen Model: 2240

## 2017-11-02 NOTE — Patient Instructions (Addendum)
Medication Instructions:  Your physician recommends that you continue on your current medications as directed. Please refer to the Current Medication list given to you today.  Labwork: None ordered.  Testing/Procedures: None ordered.  Follow-Up: Your physician wants you to follow-up in: 3 months with Dr. Rayann Heman.     Remote monitoring is used to monitor your Pacemaker from home. This monitoring reduces the number of office visits required to check your device to one time per year. It allows [place] to keep an eye on the functioning of your device to ensure it is working properly. You are scheduled for a device check from home on 12/19/2017. You may send your transmission at any time that day. If you have a wireless device, the transmission will be sent automatically. After your physician reviews your transmission, you will receive a postcard with your next transmission date.  Any Other Special Instructions Will Be Listed Below (If Applicable).  If you need a refill on your cardiac medications before your next appointment, please call your pharmacy.

## 2017-11-02 NOTE — Progress Notes (Signed)
PCP: Hoyt Koch, MD Primary Cardiologist:  Dr [name] Primary EP:  Dr Rayann Heman  Mary [name] is a 77 y.o. female who presents today for routine electrophysiology followup.  Recent remote revealed atrial lead failure with atrial undersensing.  She therefore presents today for further evaluation. Today, she denies symptoms of palpitations, exertional chest pain, shortness of breath,  lower extremity edema, dizziness, presyncope, or syncope.  The patient is otherwise without complaint today.   Past Medical History:  Diagnosis Date  . Arthritis   . Atrial tachycardia ([place]) 04/23/2017  . CAD (coronary artery disease)    non obstructive  . 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 Procedure complicated by an atrial lead dislodgment and repeat procedure, 12/2013   . Carotid stenosis    Carotid US 5/16:  Bilateral ICA 1-39%; L vertebral retrograde; L BP 126/49, R BP 140/57  . CHF (congestive heart failure) ([place])   . Chronic lower back pain   . Colon cancer ([place])    a. s/p chemo  . Colon polyps   . Diabetic peripheral neuropathy ([place])   . DVT (deep venous thrombosis) ([place]) 2013   "twice behind knee on left side" (06/25/2013)  . Glaucoma   . Hyperlipidemia   . Hypertension   . Hypothyroidism   . Iron deficiency anemia   . Multinodular goiter   . Sleep apnea    a. resolved post weight loss   . Type II diabetes mellitus ([place])    Past Surgical History:  Procedure Laterality Date  . CARDIAC CATHETERIZATION  2006   Never had PCI, 3 caths total. Last one in [place]  . CARDIAC CATHETERIZATION N/A 02/04/2015   Procedure: Left Heart Cath and Coronary Angiography;  Surgeon: Belva Crome, MD;  Location: [facility];  Service: Cardiovascular;  Laterality: N/A;  . CATARACT EXTRACTION, BILATERAL Bilateral 2013   "and put stent in my left eye for glaucoma" (06/25/2013)  . CHOLECYSTECTOMY  1980's  . COLECTOMY  10/2010   Tumor removal  . EYE MUSCLE SURGERY Bilateral ~ 1963   "muscles too long; eyes would go out and up; tied muscles to hold my eyes straight" (06/25/2013)  . LEAD REVISION  01-03-2014   atrial lead revision by Dr Rayann Heman  . LEAD REVISION N/A 01/03/2014   Procedure: LEAD REVISION;  Surgeon: Coralyn Mark, MD;  Location: [facility];  Service: Cardiovascular;  Laterality: N/A;  . PACEMAKER INSERTION  01/02/2014   STJ Assurity dual chamber pacemaker implnated by Dr Lovena Le for SSS  . PERMANENT PACEMAKER INSERTION N/A 01/02/2014   Procedure: PERMANENT PACEMAKER INSERTION;  Surgeon: Evans Lance, MD;  Location: [facility] C[facility];  Service: Cardiovascular;  Laterality: N/A;  . PORTACATH PLACEMENT Left 11/2010  . ROUX-EN-Y GASTRIC BYPASS  2009  . ULNAR TUNNEL RELEASE Left ~ 2008  . UMBILICAL HERNIA REPAIR  1970's?    (06/25/2013)    ROS- all systems are reviewed and negative except as per HPI above  Current Outpatient Medications  Medication Sig Dispense Refill  . atorvastatin (LIPITOR) 80 MG tablet TAKE 1 TABLET BY MOUTH EVERY DAY 30 tablet 5  . bimatoprost (LUMIGAN) 0.01 % SOLN Place 1 drop into the right eye at bedtime.     Marland Kitchen buPROPion (WELLBUTRIN XL) 150 MG 24 hr tablet TAKE 1 TABLET BY MOUTH EVERY DAY 30 tablet 0  . ELIQUIS 5 MG TABS tablet TAKE 1 TABLET BY MOUTH TWICE  A DAY 180 tablet 3  . flecainide (TAMBOCOR) 50 MG tablet Take 1 tablet (50 mg total) by mouth 2 (two) times daily. 60 tablet 10  . furosemide (LASIX) 80 MG tablet Take 1 tablet (80 mg total) by mouth daily. 30 tablet 6  . gabapentin (NEURONTIN) 300 MG capsule Take 300 mg by mouth at bedtime.     Marland Kitchen HYDROcodone-acetaminophen (NORCO/VICODIN) 5-325 MG tablet Take 1 tablet by mouth every 4 (four) hours as needed for moderate pain. 30 tablet 0  . insulin glargine (LANTUS) 100 unit/mL SOPN Inject 36 Units into the skin at bedtime.     Marland Kitchen levothyroxine (SYNTHROID, LEVOTHROID) 112 MCG tablet Take 112 mcg by mouth daily before breakfast.    .  metoprolol succinate (TOPROL XL) 25 MG 24 hr tablet Take 1 tablet (25 mg total) by mouth at bedtime. 90 tablet 3  . nitroGLYCERIN (NITROSTAT) 0.4 MG SL tablet Place 1 tablet (0.4 mg total) under the tongue every 5 (five) minutes as needed for chest pain. 75 tablet 2  . potassium chloride SA (K-DUR,KLOR-CON) 20 MEQ tablet TAKE 2 TABLETS BY MOUTH ONCE DAILY 180 tablet 2   No current facility-administered medications for this visit.     Physical Exam: Vitals:   11/02/17 1643  BP: 122/72  Pulse: 69  Weight: 250 lb (113.4 kg)  Height: 5\' 7"  (1.702 m)    GEN- The patient is overweight appearing, alert and oriented x 3 today.   Head- normocephalic, atraumatic Eyes-  Sclera clear, conjunctiva pink Ears- hearing intact Oropharynx- clear Lungs- Clear to ausculation bilaterally, normal work of breathing Chest- pacemaker pocket is well healed Heart- Regular rate and rhythm, no murmurs, rubs or gallops, PMI not laterally displaced GI- soft, NT, ND, + BS Extremities- no clubbing, cyanosis, or edema  Pacemaker interrogation- reviewed in detail today,  See PACEART report  ekg tracing ordered today is personally reviewed and shows A sensing, V pacing  Assessment and Plan:  1. 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  She A paces 66 % .  Her atrial lead p waves measure 0.4.   She has had some atrial lead noise as well with atrial lead sensitivity at 0.3 mV.  At this time, she would like to avoid lead revision.  Of note, she had atrial lead dislodgement initially after primary implant by Dr Lovena Le and required atrial lead revision by me the following day.  CXR reveals poor lead slack chronically.  Today, I have increased lower pacing rate to 70 bpm and turned rate response on with slope of 10.  I anticipate that this will force atrial pacing closer to 100% and hopefully make her sensing issues moot. See Pace Art  report  2. ectopic atrial tachycardia Appears controlled (<1%) No changes today  3. HTN Stable No change required today  4. afib On eliquis No episodes  5. Obesity Body mass index is 39.16 kg/m. No progress with lifestyle modification  Merlin Return to see me to follow-up on her atrial lead in 3 months.  [name] MD, [facility] 11/02/2017 4:48 PM

## 2017-11-03 NOTE — Addendum Note (Signed)
Addended by: Marlis Edelson C on: 11/03/2017 02:21 PM   Modules accepted: Orders

## 2017-11-07 NOTE — Progress Notes (Deleted)
Cardiology Office Note    Date:  11/07/2017   ID:  Mary [name], DOB 05-Jan-1941, MRN [id number]  PCP:  Hoyt Koch, MD  Cardiologist: Sinclair Grooms, MD   No chief complaint on file.   History of Present Illness:  Mary [name] is a 77 y.o. female  with a history of chronic diastolic CHF, tachy-brady syndrome s/p PPM (St. Jude), PAF on Eliquis, DVT ( on Eliquis), HTN, HLD, DM, OSA, non obstructive by cath (01/2015) who presents to clinic for post hospital follow up.      Past Medical History:  Diagnosis Date  . Arthritis   . Atrial tachycardia ([place]) 04/23/2017  . CAD (coronary artery disease)    non obstructive  . 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 Procedure complicated by an atrial lead dislodgment and repeat procedure, 12/2013   . Carotid stenosis    Carotid US 5/16:  Bilateral ICA 1-39%; L vertebral retrograde; L BP 126/49, R BP 140/57  . CHF (congestive heart failure) ([place])   . Chronic lower back pain   . Colon cancer ([place])    a. s/p chemo  . Colon polyps   . Diabetic peripheral neuropathy ([place])   . DVT (deep venous thrombosis) ([place]) 2013   "twice behind knee on left side" (06/25/2013)  . Glaucoma   . Hyperlipidemia   . Hypertension   . Hypothyroidism   . Iron deficiency anemia   . Multinodular goiter   . Sleep apnea    a. resolved post weight loss   . Type II diabetes mellitus ([place])     Past Surgical History:  Procedure Laterality Date  . CARDIAC CATHETERIZATION  2006   Never had PCI, 3 caths total. Last one in [place]  . CARDIAC CATHETERIZATION N/A 02/04/2015   Procedure: Left Heart Cath and Coronary Angiography;  Surgeon: Belva Crome, MD;  Location: [place];  Service: Cardiovascular;  Laterality: N/A;  . CATARACT EXTRACTION, BILATERAL Bilateral 2013   "and put stent in my left eye for glaucoma" (06/25/2013)  . CHOLECYSTECTOMY  1980's  . COLECTOMY  10/2010   Tumor  removal  . EYE MUSCLE SURGERY Bilateral ~ 1963   "muscles too long; eyes would go out and up; tied muscles to hold my eyes straight" (06/25/2013)  . LEAD REVISION  01-03-2014   atrial lead revision by Dr Rayann Heman  . LEAD REVISION N/A 01/03/2014   Procedure: LEAD REVISION;  Surgeon: Coralyn Mark, MD;  Location: [facility] CATH [facility];  Service: Cardiovascular;  Laterality: N/A;  . PACEMAKER INSERTION  01/02/2014   STJ Assurity dual chamber pacemaker implnated by Dr Lovena Le for SSS  . PERMANENT PACEMAKER INSERTION N/A 01/02/2014   Procedure: PERMANENT PACEMAKER INSERTION;  Surgeon: Evans Lance, MD;  Location: [facility] CATH LAB;  Service: Cardiovascular;  Laterality: N/A;  . PORTACATH PLACEMENT Left 11/2010  . ROUX-EN-Y GASTRIC BYPASS  2009  . ULNAR TUNNEL RELEASE Left ~ 2008  . UMBILICAL HERNIA REPAIR  1970's?    (06/25/2013)    Current Medications: Outpatient Medications Prior to Visit  Medication Sig Dispense Refill  . atorvastatin (LIPITOR) 80 MG tablet TAKE 1 TABLET BY MOUTH EVERY DAY 30 tablet 5  . bimatoprost (LUMIGAN) 0.01 % SOLN Place 1 drop into the right eye at bedtime.     Marland Kitchen buPROPion (WELLBUTRIN XL) 150 MG 24 hr tablet TAKE 1 TABLET BY MOUTH EVERY DAY 30 tablet 0  .  ELIQUIS 5 MG TABS tablet TAKE 1 TABLET BY MOUTH TWICE A DAY 180 tablet 3  . flecainide (TAMBOCOR) 50 MG tablet Take 1 tablet (50 mg total) by mouth 2 (two) times daily. 60 tablet 10  . furosemide (LASIX) 80 MG tablet Take 1 tablet (80 mg total) by mouth daily. 30 tablet 6  . gabapentin (NEURONTIN) 300 MG capsule Take 300 mg by mouth at bedtime.     Marland Kitchen HYDROcodone-acetaminophen (NORCO/VICODIN) 5-325 MG tablet Take 1 tablet by mouth every 4 (four) hours as needed for moderate pain. 30 tablet 0  . insulin glargine (LANTUS) 100 unit/mL SOPN Inject 36 Units into the skin at bedtime.     Marland Kitchen levothyroxine (SYNTHROID, LEVOTHROID) 112 MCG tablet Take 112 mcg by mouth daily before breakfast.    . metoprolol succinate (TOPROL XL) 25 MG 24 hr tablet  Take 1 tablet (25 mg total) by mouth at bedtime. 90 tablet 3  . nitroGLYCERIN (NITROSTAT) 0.4 MG SL tablet Place 1 tablet (0.4 mg total) under the tongue every 5 (five) minutes as needed for chest pain. 75 tablet 2  . potassium chloride SA (K-DUR,KLOR-CON) 20 MEQ tablet TAKE 2 TABLETS BY MOUTH ONCE DAILY 180 tablet 2   No facility-administered medications prior to visit.      Allergies:   Other; Adhesive [tape]; Aspirin; Latex; and Nsaids   Social History   Socioeconomic History  . Marital status: Married    Spouse name: Mary [name]  . Number of children: 0  . Years of education: Not on file  . Highest education level: Not on file  Social Needs  . Financial resource strain: Not on file  . Food insecurity - worry: Not on file  . Food insecurity - inability: Not on file  . Transportation needs - medical: Not on file  . Transportation needs - non-medical: Not on file  Occupational History  . Occupation: Retired    Comment: office work  Tobacco Use  . Smoking status: Light Tobacco Smoker    Packs/day: 1.00    Years: 30.00    Pack years: 30.00    Types: Cigarettes    Last attempt to quit: 05/03/2013    Years since quitting: 4.5  . Smokeless tobacco: Never Used  . Tobacco comment: 06/25/2013 "smoked off and on since I was 22; stopped 10/2007 til 08/30/2012 then started again cause of stress"  Substance and Sexual Activity  . Alcohol use: Yes    Alcohol/week: 0.0 oz    Comment: 06/25/2013 "glass of wine 1-2 times/yr"  . Drug use: No  . Sexual activity: Yes  Other Topics Concern  . Not on file  Social History Narrative   Married to husband, Mary [name]   No children   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   Recently moved back here from [place]      Family History:  The patient's ***family history includes Bladder Cancer in her cousin; Breast cancer in her maternal aunt, mother, and sister; Clotting disorder in her sister; Colon polyps in her brother; Diabetes in  her maternal grandmother, mother, and sister; Heart attack in her brother, father, and sister; Heart disease in her father; Pancreatic cancer in her brother; Prostate cancer in her brother.   ROS:   Please see the history of present illness.    ***  All other systems reviewed and are negative.   PHYSICAL EXAM:   VS:  There were no vitals taken for this visit.   GEN: Well  nourished, well developed, in no acute distress  HEENT: normal  Neck: no JVD, carotid bruits, or masses Cardiac: ***RRR; no murmurs, rubs, or gallops,no edema  Respiratory:  clear to auscultation bilaterally, normal work of breathing GI: soft, nontender, nondistended, + BS MS: no deformity or atrophy  Skin: warm and dry, no rash Neuro:  Alert and Oriented x 3, Strength and sensation are intact Psych: euthymic mood, full affect  Wt Readings from Last 3 Encounters:  11/02/17 250 lb (113.4 kg)  06/06/17 251 lb 9.6 oz (114.1 kg)  05/23/17 258 lb (117 kg)      Studies/Labs Reviewed:   EKG:  EKG  ***  Recent Labs: 04/23/2017: Magnesium 2.0; TSH 6.855 04/25/2017: BUN 21; Creatinine, Ser 1.33; Hemoglobin 10.7; Platelets 301; Potassium 3.9; Sodium 136   Lipid Panel    Component Value Date/Time   CHOL 178 08/02/2016 0102   TRIG 229 (H) 08/02/2016 0102   HDL 42 08/02/2016 0102   CHOLHDL 4.2 08/02/2016 0102   VLDL 46 (H) 08/02/2016 0102   LDLCALC 90 08/02/2016 0102   LDLDIRECT 79.0 03/23/2016 1529    Additional studies/ records that were reviewed today include:  ***    ASSESSMENT:    1. Tobacco abuse   2. Second degree AV block, Mobitz type II   3. PAF (paroxysmal atrial fibrillation) ([place])   4. Essential hypertension   5. Hyperlipidemia, unspecified hyperlipidemia type   6. Hx of gastric bypass   7. Diabetes mellitus with complication ([place])   8. Chronic diastolic heart failure (HCC)   9. Cardiac pacemaker in situ - St Jude   10. Atrioventricular block, complete (HCC)      PLAN:  In order of  problems listed above:  1. ***    Medication Adjustments/Labs and Tests Ordered: Current medicines are reviewed at length with the patient today.  Concerns regarding medicines are outlined above.  Medication changes, Labs and Tests ordered today are listed in the Patient Instructions below. There are no Patient Instructions on file for this visit.   Signed, Sinclair Grooms, MD  11/07/2017 7:41 PM    [address], [place], [place]  [address] Phone: [phone]; Fax: [phone]

## 2017-11-08 ENCOUNTER — Encounter: Payer: Self-pay | Admitting: Interventional Cardiology

## 2017-11-08 ENCOUNTER — Ambulatory Visit: Payer: Medicare Other | Admitting: Interventional Cardiology

## 2017-11-10 DIAGNOSIS — E039 Hypothyroidism, unspecified: Secondary | ICD-10-CM | POA: Diagnosis not present

## 2017-11-10 DIAGNOSIS — I509 Heart failure, unspecified: Secondary | ICD-10-CM | POA: Diagnosis not present

## 2017-11-10 DIAGNOSIS — E1142 Type 2 diabetes mellitus with diabetic polyneuropathy: Secondary | ICD-10-CM | POA: Diagnosis not present

## 2017-11-10 DIAGNOSIS — E049 Nontoxic goiter, unspecified: Secondary | ICD-10-CM | POA: Diagnosis not present

## 2017-11-10 DIAGNOSIS — Z794 Long term (current) use of insulin: Secondary | ICD-10-CM | POA: Diagnosis not present

## 2017-11-16 ENCOUNTER — Telehealth: Payer: Self-pay

## 2017-11-16 ENCOUNTER — Other Ambulatory Visit (INDEPENDENT_AMBULATORY_CARE_PROVIDER_SITE_OTHER): Payer: Medicare Other

## 2017-11-16 ENCOUNTER — Encounter: Payer: Self-pay | Admitting: Nurse Practitioner

## 2017-11-16 ENCOUNTER — Ambulatory Visit (INDEPENDENT_AMBULATORY_CARE_PROVIDER_SITE_OTHER): Payer: Medicare Other | Admitting: Nurse Practitioner

## 2017-11-16 VITALS — BP 120/68 | HR 76 | Ht 65.5 in | Wt 262.0 lb

## 2017-11-16 DIAGNOSIS — Z85038 Personal history of other malignant neoplasm of large intestine: Secondary | ICD-10-CM | POA: Diagnosis not present

## 2017-11-16 DIAGNOSIS — D649 Anemia, unspecified: Secondary | ICD-10-CM

## 2017-11-16 DIAGNOSIS — Z7901 Long term (current) use of anticoagulants: Secondary | ICD-10-CM | POA: Diagnosis not present

## 2017-11-16 LAB — IBC PANEL
IRON: 43 ug/dL (ref 42–145)
Saturation Ratios: 7.5 % — ABNORMAL LOW (ref 20.0–50.0)
Transferrin: 407 mg/dL — ABNORMAL HIGH (ref 212.0–360.0)

## 2017-11-16 LAB — CBC
HCT: 35.4 % — ABNORMAL LOW (ref 36.0–46.0)
Hemoglobin: 11.6 g/dL — ABNORMAL LOW (ref 12.0–15.0)
MCHC: 32.9 g/dL (ref 30.0–36.0)
MCV: 83.7 fl (ref 78.0–100.0)
Platelets: 358 10*3/uL (ref 150.0–400.0)
RBC: 4.22 Mil/uL (ref 3.87–5.11)
RDW: 14.6 % (ref 11.5–15.5)
WBC: 9.8 10*3/uL (ref 4.0–10.5)

## 2017-11-16 LAB — FERRITIN: FERRITIN: 9.6 ng/mL — AB (ref 10.0–291.0)

## 2017-11-16 MED ORDER — SUPREP BOWEL PREP KIT 17.5-3.13-1.6 GM/177ML PO SOLN: 1.0000 | ORAL | 0 refills | Status: DC

## 2017-11-16 NOTE — Progress Notes (Signed)
IMPRESSION and PLAN:    #1. 77 yo female with multiple medical problems including a hx of colon cancer IIIBin 2012, s/p resection and chemo. Surveillance colonoscopy in 2016 negative for polyps or recurrent cancer. Due for surveillance colonoscopy.  -Patient will be scheduled for a colonoscopy with possible polypectomy.  The risks and benefits of the procedure were discussed and the patient agrees to proceed.   2. AFIB, on Eliquis.  -Hold Eliquis for 2 days before procedure - will instruct when and how to resume after procedure. Patient understands that there is a low but real risk of cardiovascular event such as heart attack, stroke, embolism, thrombosis or ischemia/infarct while off Eliquis. The patient consents to proceed. Will communicate by phone or EMR with patient's prescribing provider to confirm that holding Eliquis is reasonable in this case.     #3.[facility] of cancer. Brother died from pancreatic cancer.  Younger brother has prostate / bladder cancer and sister passed from breast cancer.       HPI:    Chief Complaint: colon cancer surveillance.   Patient is a 77 yo female known to Dr. Henrene Pastor with multiple medical problems not limited to CAD, HTN, DVTs, DM, obesity,AFIB,  pacemaker, and colon colon cancer (splenic flexure) in 2012, s/p resection and chemo. No polyps nor recurrent cancer on follow up colonoscopy in 2016, she is due again for surveillance.   Patient had a gastric bypass in 2009. She is concerned about iron levels, required IV iron at one time. Would like iron levels checked. She is having a physical next week. In late Augusts 2018 hgb down slightly from 11.6 to 10.7.  No rectal bleeding or black stools. No NSAIDS.  Patient on Eliquis since 2014 for Afib.  Review of systems:   Positive for fatigue. No chest pain, palpitations. No SOB   Past Medical History:  Diagnosis Date  . Arthritis   . Atrial tachycardia ([place]) 04/23/2017  . CAD (coronary artery  disease)    non obstructive  . 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 Procedure complicated by an atrial lead dislodgment and repeat procedure, 12/2013   . Carotid stenosis    Carotid US 5/16:  Bilateral ICA 1-39%; L vertebral retrograde; L BP 126/49, R BP 140/57  . CHF (congestive heart failure) ([place])   . Chronic lower back pain   . Colon cancer ([place])    a. s/p chemo  . Colon polyps   . Diabetic peripheral neuropathy ([place])   . DVT (deep venous thrombosis) ([place]) 2013   "twice behind knee on left side" (06/25/2013)  . Glaucoma   . Hyperlipidemia   . Hypertension   . Hypothyroidism   . Iron deficiency anemia   . Multinodular goiter   . Sleep apnea    a. resolved post weight loss   . Type II diabetes mellitus (HCC)     Patient's surgical history, family medical history, social history, medications and allergies were all reviewed in Epic    Physical Exam:     BP 120/68   Pulse 76   Ht 5' 5.5" (1.664 m)   Wt 262 lb (118.8 kg)   BMI 42.94 kg/m   GENERAL: obese white female in NAD PSYCH: :Pleasant, cooperative, normal affect EENT:  conjunctiva pink, mucous membranes moist, neck supple without masses CARDIAC:  RRR, no peripheral edema PULM: Normal respiratory effort, lungs CTA bilaterally, no wheezing ABDOMEN:  Nondistended, soft,  nontender. No obvious masses, no hepatomegaly,  normal bowel sounds SKIN:  turgor, no lesions seen Musculoskeletal:  Normal muscle tone, normal strength NEURO: Alert and oriented x 3, no focal neurologic deficits  Cc: Tye Savoy , NP 11/16/2017, 1:44 PM'

## 2017-11-16 NOTE — Telephone Encounter (Signed)
[facility] [address] [place], [place]  [address] Phone:  [phone]   Fax:  [phone]  11/16/2017   RE:      Mary [name] DOB:   [birth year] MRN:   [id number]   Dear Dr. Rayann Heman,    We have scheduled the above patient for an endoscopic procedure. Our records show that she is on anticoagulation therapy.   Please advise as to how long the patient may come off her therapy of Eliquis prior to the colonoscopy procedure, which is scheduled for 12/20/17.  Please fax back/ or route the completed form to [facility] at [phone].   Sincerely,    Karena Addison, CMA

## 2017-11-16 NOTE — Telephone Encounter (Signed)
   Mary [name] 1940/09/23 [id number]  Dear Dr. Rayann Heman:  We have scheduled the above named patient for a(n) colonoscopy procedure. Our records show that (s)he is on Eliquis anticoagulation therapy.  Please advise as to whether the patient may come off their therapy of Eliquis prior to their procedure which is scheduled for 12/20/17.  Please route your response to [facility] or fax response to [phone].  Sincerely,  Docia Chuck, [facility]

## 2017-11-16 NOTE — Patient Instructions (Addendum)
If you are age 76 or older, your body mass index should be between 23-30. Your Body mass index is 42.94 kg/m. If this is out of the aforementioned range listed, please consider follow up with your Primary Care Provider.  If you are age 68 or younger, your body mass index should be between 19-25. Your Body mass index is 42.94 kg/m. If this is out of the aformentioned range listed, please consider follow up with your Primary Care Provider.   Your physician has requested that you go to the basement for lab work before leaving today.  We have sent the following medications to your pharmacy for you to pick up at your convenience: [employer] have been scheduled for a colonoscopy. Please follow written instructions given to you at your visit today.  Please pick up your prep supplies at the pharmacy within the next 1-3 days. If you use inhalers (even only as needed), please bring them with you on the day of your procedure. Your physician has requested that you go to www.startemmi.com and enter the access code given to you at your visit today. This web site gives a general overview about your procedure. However, you should still follow specific instructions given to you by our office regarding your preparation for the procedure.  You will be contacted by our office prior to your procedure for directions on holding your Eliquis.  If you do not hear from our office 1 week prior to your scheduled procedure, please call [phone] to discuss.    Thank you for choosing me and [facility].  Tye Savoy, NP

## 2017-11-16 NOTE — Progress Notes (Signed)
Assessment and plans reviewed  

## 2017-11-17 NOTE — Telephone Encounter (Signed)
Patient with diagnosis of Afib with hx of DVT in 2013 on Eliquis for anticoagulation.    Procedure: colonoscopy Date of procedure: 12/20/17  CHADS2-VASc score of  7 (CHF, HTN, AGE, DM2, stroke/tia x 2, CAD, AGE, female)  CrCl 34ml/min  Per office protocol, patient can hold Eliquis for 24 hours prior to procedure.

## 2017-11-17 NOTE — Telephone Encounter (Signed)
   I will route this to the Pharmacy Pool.  Rosaria Ferries, PA-C 11/17/2017 11:57 AM Beeper [phone]

## 2017-11-22 NOTE — Telephone Encounter (Signed)
Patient returning Gloria's call on info about holding medication.

## 2017-11-23 NOTE — Telephone Encounter (Signed)
Spoke with patients husband regarding holding Eliquis 24 hours prior to procedure.  Husband stated patient is not available to talk but will give her the instructions and requested that I send a patient message in my chart, which was done.

## 2017-11-24 ENCOUNTER — Telehealth: Payer: Self-pay

## 2017-11-24 NOTE — Telephone Encounter (Signed)
Spoke with patient this morning regarding hold Eliquis 24 hours prior to procedure.  Patient verbalized understanding.

## 2017-12-01 ENCOUNTER — Ambulatory Visit (INDEPENDENT_AMBULATORY_CARE_PROVIDER_SITE_OTHER): Payer: Medicare Other | Admitting: *Deleted

## 2017-12-01 VITALS — BP 144/82 | HR 70 | Resp 18 | Ht 66.0 in | Wt 268.0 lb

## 2017-12-01 DIAGNOSIS — Z0000 Encounter for general adult medical examination without abnormal findings: Secondary | ICD-10-CM | POA: Diagnosis not present

## 2017-12-01 NOTE — Progress Notes (Addendum)
Subjective:   Mary [name] is a 77 y.o. female who presents for Medicare Annual (Subsequent) preventive examination. Patient's right index finger is swollen and red. Review of Systems:  No ROS.  Medicare Wellness Visit. Additional risk factors are reflected in the social history.  Cardiac Risk Factors include: advanced age (>53mn, >>3women);diabetes mellitus;dyslipidemia;hypertension;sedentary lifestyle;obesity (BMI >30kg/m2) Sleep patterns: feels rested on waking, gets up 2 times nightly to void and sleeps 6-7 hours nightly.    Home Safety/Smoke Alarms: Feels safe in home. Smoke alarms in place.  Living environment; residence and FAdult nurse apartment, equipment: CRadio producer Type: [place] Type: RConservation officer, natureand HOmnicom Type: Tub SSurveyor, quantity no firearms. Seat Belt Safety/Bike Helmet: Wears seat belt.     Objective:     Vitals: BP (!) 144/82   Pulse 70   Resp 18   Ht _0  (1.676 m)   Wt 268 lb (121.6 kg)   SpO2 96%   BMI 43.26 kg/m   Body mass index is 43.26 kg/m.  Advanced Directives 12/01/2017 04/25/2017 04/22/2017 08/01/2016 07/07/2016 01/08/2016 02/04/2015  Does Patient Have a Medical Advance Directive? _1  No No  Would patient like information on creating a medical advance directive? Yes (ED - Information included in AVS) - No - Patient declined - - No - patient declined information No - patient declined information  Pre-existing out of facility DNR order (yellow form or pink MOST form) - - - - - - -    Tobacco Social History   Tobacco Use  Smoking Status Former Smoker  . Packs/day: 1.00  . Years: 30.00  . Pack years: 30.00  . Types: Cigarettes  . Last attempt to quit: 02/2017  . Years since quitting: 0.7  Smokeless Tobacco Never Used  Tobacco Comment   06/25/2013 "smoked off and on since I was 22; stopped 10/2007 til 08/30/2012 then started again cause of stress"     Counseling given: Not Answered Comment: 06/25/2013 "smoked  off and on since I was 22; stopped 10/2007 til 08/30/2012 then started again cause of stress"  Past Medical History:  Diagnosis Date  . Arthritis   . Atrial tachycardia ([place] 04/23/2017  . CAD (coronary artery disease)    non obstructive  . 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 Procedure complicated by an atrial lead dislodgment and repeat procedure, 12/2013   . Carotid stenosis    Carotid [place]:  Bilateral ICA 1-39%; L vertebral retrograde; L BP 126/49, R BP 140/57  . CHF (congestive heart failure) ([facility]   . Chronic lower back pain   . Colon cancer ([place]    a. s/p chemo  . Colon polyps   . Diabetic peripheral neuropathy ([place]   . DVT (deep venous thrombosis) ([place] 2013   "twice behind knee on left side" (06/25/2013)  . Glaucoma   . Hyperlipidemia   . Hypertension   . Hypothyroidism   . Iron deficiency anemia   . Multinodular goiter   . Sleep apnea    a. resolved post weight loss   . Type II diabetes mellitus ([facility]    Past Surgical History:  Procedure Laterality Date  . CARDIAC CATHETERIZATION  2006   Never had PCI, 3 [place] . CARDIAC CATHETERIZATION N/A 02/04/2015   Procedure: Left Heart Cath and Coronary Angiography;  Surgeon: HBelva Crome MD;  [facility];  Service: Cardiovascular;  Laterality: N/A;  . CATARACT EXTRACTION, BILATERAL Bilateral 2013   "and put stent in my left eye for glaucoma" (06/25/2013)  . CHOLECYSTECTOMY  1980's  . COLECTOMY  10/2010   Tumor removal  . EYE MUSCLE SURGERY Bilateral ~ 1963   "muscles too long; eyes would go out and up; tied muscles to hold my eyes straight" (06/25/2013)  . LEAD REVISION  01-03-2014   atrial lead revision by Dr Rayann Heman  . LEAD REVISION N/A 01/03/2014   Procedure: LEAD REVISION;  Surgeon: Coralyn Mark, MD;  Location: [facility];  Service: Cardiovascular;  Laterality: N/A;  . PACEMAKER INSERTION  01/02/2014   STJ  Assurity dual chamber pacemaker implnated by Dr Lovena Le for SSS  . PERMANENT PACEMAKER INSERTION N/A 01/02/2014   Procedure: PERMANENT PACEMAKER INSERTION;  Surgeon: Evans Lance, MD;  Location: [facility] C[facility] LAB;  Service: Cardiovascular;  Laterality: N/A;  . PORTACATH PLACEMENT Left 11/2010  . ROUX-EN-Y GASTRIC BYPASS  2009  . ULNAR TUNNEL RELEASE Left ~ 2008  . UMBILICAL HERNIA REPAIR  1970's?    (06/25/2013)   Family History  Problem Relation Age of Onset  . Breast cancer Mother   . Diabetes Mother   . Heart disease Father   . Heart attack Father   . Heart attack Sister   . Heart attack Brother   . Bladder Cancer Brother   . Prostate cancer Brother   . Colon polyps Brother   . Breast cancer Sister   . Breast cancer Maternal Aunt        x 2 aunts  . Pancreatic cancer Brother   . Colon polyps Brother   . Bladder Cancer Cousin   . Clotting disorder Sister   . Diabetes Sister        x 3  . Diabetes Maternal Grandmother    Social History   Socioeconomic History  . Marital status: Married    Spouse name: [name]  . Number of children: 0  . Years of education: Not on file  . Highest education level: Not on file  Occupational History  . Occupation: Retired    Comment: office work  Scientific laboratory technician  . Financial resource strain: Not very hard  . Food insecurity:    Worry: Never true    Inability: Never true  . Transportation needs:    Medical: No    Non-medical: No  Tobacco Use  . Smoking status: Former Smoker    Packs/day: 1.00    Years: 30.00    Pack years: 30.00    Types: Cigarettes    Last attempt to quit: 02/2017    Years since quitting: 0.7  . Smokeless tobacco: Never Used  . Tobacco comment: 06/25/2013 "smoked off and on since I was 22; stopped 10/2007 til 08/30/2012 then started again cause of stress"  Substance and Sexual Activity  . Alcohol use: Yes    Alcohol/week: 0.0 oz    Comment: 06/25/2013 "glass of wine 1-2 times/yr"  . Drug use: No  . Sexual activity: Yes   Lifestyle  . Physical activity:    Days per week: 0 days    Minutes per session: 0 min  . Stress: Only a little  Relationships  . Social connections:    Talks on phone: More than three times a week    Gets together: More than three times a week    Attends religious service: More than 4 times per year    Active member of club or  organization: Yes    Attends meetings of clubs or organizations: More than 4 times per year    Relationship status: Married  Other Topics Concern  . Not on file  Social History Narrative   Married to husband, [name]   No children   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   Recently moved back here from [place]     Outpatient Encounter Medications as of 12/01/2017  Medication Sig  . atorvastatin (LIPITOR) 80 MG tablet TAKE 1 TABLET BY MOUTH EVERY DAY  . bimatoprost (LUMIGAN) 0.01 % SOLN Place 1 drop into the right eye at bedtime.   Marland Kitchen buPROPion (WELLBUTRIN XL) 150 MG 24 hr tablet TAKE 1 TABLET BY MOUTH EVERY DAY  . ELIQUIS 5 MG TABS tablet TAKE 1 TABLET BY MOUTH TWICE A DAY  . flecainide (TAMBOCOR) 50 MG tablet Take 1 tablet (50 mg total) by mouth 2 (two) times daily.  . furosemide (LASIX) 80 MG tablet Take 1 tablet (80 mg total) by mouth daily.  Marland Kitchen gabapentin (NEURONTIN) 300 MG capsule Take 300 mg by mouth at bedtime.   Marland Kitchen HYDROcodone-acetaminophen (NORCO/VICODIN) 5-325 MG tablet Take 1 tablet by mouth every 4 (four) hours as needed for moderate pain.  . Insulin Glargine (BASAGLAR KWIKPEN [place]) Inject 48 Units into the skin.  Marland Kitchen levothyroxine (SYNTHROID, LEVOTHROID) 112 MCG tablet Take 112 mcg by mouth daily before breakfast.  . metoprolol succinate (TOPROL XL) 25 MG 24 hr tablet Take 1 tablet (25 mg total) by mouth at bedtime.  . nitroGLYCERIN (NITROSTAT) 0.4 MG SL tablet Place 1 tablet (0.4 mg total) under the tongue every 5 (five) minutes as needed for chest pain.  . potassium chloride SA (K-DUR,KLOR-CON) 20 MEQ tablet TAKE 2 TABLETS BY  MOUTH ONCE DAILY  . SUPREP BOWEL PREP KIT 17.5-3.13-1.6 GM/177ML SOLN Take 1 kit by mouth as directed.  . [DISCONTINUED] insulin glargine (LANTUS) 100 unit/mL SOPN Inject 36 Units into the skin at bedtime.    No facility-administered encounter medications on file as of 12/01/2017.     Activities of Daily Living In your present state of health, do you have any difficulty performing the following activities: 12/01/2017  Hearing? N  Vision? N  Difficulty concentrating or making decisions? N  Walking or climbing stairs? Y  Dressing or bathing? N  Doing errands, shopping? Y  Preparing Food and eating ? Y  Using the Toilet? N  In the past six months, have you accidently leaked urine? N  Do you have problems with loss of bowel control? N  Managing your Medications? N  Managing your Finances? N  Housekeeping or managing your Housekeeping? Y  Some recent data might be hidden    Patient Care Team: Hoyt Koch, MD as PCP - General (Internal Medicine) Belva Crome, MD as Consulting Physician (Cardiology) Roel Cluck, MD as Referring Physician (Ophthalmology) Irene Shipper, MD as Consulting Physician (Gastroenterology) Delrae Rend, MD as Consulting Physician (Endocrinology)    Assessment:   This is a routine wellness examination for Mary [name]. Physical assessment deferred to PCP.   Exercise Activities and Dietary recommendations Current Exercise Habits: Structured exercise class(chair exercise pamphlets provided), Time (Minutes): 45, Exercise limited by: orthopedic condition(s)  Diet (meal preparation, eat out, water intake, caffeinated beverages, dairy products, fruits and vegetables): in general, a "healthy" diet  , on average, 2 meals per day, reports starting to eat mediterranean diet. States she has a fluid restriction due to heart failure.  Reviewed heart healthy and diabetic diet, Relevant patient education assigned to patient using Emmi video programs for diabetes  and heart healthy diet.  Goals    . Patient Stated     Work towards lowering my blood sugar and Hgb A1c, by monitoring sugar and carbohydrates, begin chair exercises, stay as healthy and as independent as possible.       Fall Risk Fall Risk  12/01/2017 08/04/2017 08/10/2016 08/05/2016 08/04/2015  Falls in the past year? Yes Yes Yes No Yes  Comment - Emmi Telephone Survey: data to providers prior to load - Emmi Telephone Survey: data to providers prior to load -  Number falls in past yr: 2 or more 2 or more 2 or more - 2 or more  Comment - Emmi Telephone Survey Actual Response = 8 - - -  Injury with Fall? - No No - No  Risk Factor Category  High Fall Risk - - - -  Risk for fall due to : Impaired mobility;Impaired balance/gait - - - -   Depression Screen PHQ 2/9 Scores 12/01/2017 08/10/2016 08/04/2015  PHQ - 2 Score 0 0 0  PHQ- 9 Score 1 - -     Cognitive Function MMSE - Mini Mental State Exam 12/01/2017  Orientation to time 5  Orientation to Place 5  Registration 3  Attention/ Calculation 5  Recall 2  Language- name 2 objects 2  Language- repeat 1  Language- follow 3 step command 3  Language- read & follow direction 1  Write a sentence 1  Copy design 1  Total score 29        Immunization History  Administered Date(s) Administered  . Influenza, High Dose Seasonal PF 09/12/2014, 08/10/2016, 05/23/2017  . Influenza,inj,Quad PF,6+ Mos 06/26/2013, 08/04/2015  . Pneumococcal Conjugate-13 09/12/2014  . Pneumococcal-Unspecified 05/05/2011   Screening Tests Health Maintenance  Topic Date Due  . OPHTHALMOLOGY EXAM  12/04/1950  . URINE MICROALBUMIN  12/04/1950  . TETANUS/TDAP  12/03/1960  . HEMOGLOBIN A1C  01/31/2017  . FOOT EXAM  03/23/2017  . COLONOSCOPY  10/02/2017  . INFLUENZA VACCINE  03/30/2018  . PNA vac Low Risk Adult  Completed  . DEXA SCAN  Addressed      Plan:     Scheduled PCP visit for 12/05/17 to evaluate red and swollen right index finger. Patient states she  is unable to see a provider until 12/05/17 and she will go to urgent care if her finger starts to look worse.   Patient has colonoscopy scheduled for 12/20/17  Resource for monthly free [place] diabetes class provided.  Patient signed release forms for nurse to obtain records from doctors to validate current eye exam and foot exam.  Continue doing brain stimulating activities (puzzles, reading, adult coloring books, staying active) to keep memory sharp.   Continue to eat heart healthy diet (full of fruits, vegetables, whole grains, lean protein, water--limit salt, fat, and sugar intake) and increase physical activity as tolerated.  I have personally reviewed and noted the following in the patient's chart:   . Medical and social history . Use of alcohol, tobacco or illicit drugs  . Current medications and supplements . Functional ability and status . Nutritional status . Physical activity . Advanced directives . List of other physicians . Vitals . Screenings to include cognitive, depression, and falls . Referrals and appointments  In addition, I have reviewed and discussed with patient certain preventive protocols, quality metrics, and best practice recommendations. A written personalized care plan for  preventive services as well as general preventive health recommendations were provided to patient.   Medical screening examination/treatment/procedure(s) were performed by non-physician practitioner and as supervising physician I was immediately available for consultation/collaboration. I agree with above. Scarlette Calico, MD   Michiel Cowboy, RN  12/01/2017

## 2017-12-01 NOTE — Patient Instructions (Addendum)
Continue doing brain stimulating activities (puzzles, reading, adult coloring books, staying active) to keep memory sharp.   Continue to eat heart healthy diet (full of fruits, vegetables, whole grains, lean protein, water--limit salt, fat, and sugar intake) and increase physical activity as tolerated.   Mary [name] , Thank you for taking time to come for your Medicare Wellness Visit. I appreciate your ongoing commitment to your health goals. Please review the following plan we discussed and let me know if I can assist you in the future.   These are the goals we discussed: Goals    . Patient Stated     Work towards lowering my blood sugar and Hgb A1c, by monitoring sugar and carbohydrates, begin chair exercises, stay as healthy and as independent as possible.       This is a list of the screening recommended for you and due dates:  Health Maintenance  Topic Date Due  . Eye exam for diabetics  12/04/1950  . Urine Protein Check  12/04/1950  . Tetanus Vaccine  12/03/1960  . Hemoglobin A1C  01/31/2017  . Complete foot exam   03/23/2017  . Colon Cancer Screening  10/02/2017  . Flu Shot  03/30/2018  . Pneumonia vaccines  Completed  . DEXA scan (bone density measurement)  Addressed     It is important to avoid accidents which may result in broken bones.  Here are a few ideas on how to make your home safer so you will be less likely to trip or fall.  1. Use nonskid mats or non slip strips in your shower or tub, on your bathroom floor and around sinks.  If you know that you have spilled water, wipe it up! 2. In the bathroom, it is important to have properly installed grab bars on the walls or on the edge of the tub.  Towel racks are NOT strong enough for you to hold onto or to pull on for support. 3. Stairs and hallways should have enough light.  Add lamps or night lights if you need ore light. 4. It is good to have handrails on both sides of the stairs if possible.  Always fix broken  handrails right away. 5. It is important to see the edges of steps.  Paint the edges of outdoor steps white so you can see them better.  Put colored tape on the edge of inside steps. 6. Throw-rugs are dangerous because they can slide.  Removing the rugs is the best idea, but if they must stay, add adhesive carpet tape to prevent slipping. 7. Do not keep things on stairs or in the halls.  Remove small furniture that blocks the halls as it may cause you to trip.  Keep telephone and electrical cords out of the way where you walk. 8. Always were sturdy, rubber-soled shoes for good support.  Never wear just socks, especially on the stairs.  Socks may cause you to slip or fall.  Do not wear full-length housecoats as you can easily trip on the bottom.  9. Place the things you use the most on the shelves that are the easiest to reach.  If you use a stepstool, make sure it is in good condition.  If you feel unsteady, DO NOT climb, ask for help. 10. If a health professional advises you to use a cane or walker, do not be ashamed.  These items can keep you from falling and breaking your bones.

## 2017-12-02 ENCOUNTER — Encounter: Payer: Self-pay | Admitting: Interventional Cardiology

## 2017-12-05 ENCOUNTER — Ambulatory Visit: Payer: Medicare Other | Admitting: Internal Medicine

## 2017-12-05 ENCOUNTER — Other Ambulatory Visit: Payer: Self-pay | Admitting: Internal Medicine

## 2017-12-09 ENCOUNTER — Other Ambulatory Visit (INDEPENDENT_AMBULATORY_CARE_PROVIDER_SITE_OTHER): Payer: Medicare Other

## 2017-12-09 ENCOUNTER — Encounter: Payer: Self-pay | Admitting: Internal Medicine

## 2017-12-09 ENCOUNTER — Ambulatory Visit (INDEPENDENT_AMBULATORY_CARE_PROVIDER_SITE_OTHER): Payer: Medicare Other | Admitting: Internal Medicine

## 2017-12-09 VITALS — BP 130/80 | HR 78 | Temp 98.4°F | Ht 66.0 in | Wt 268.0 lb

## 2017-12-09 DIAGNOSIS — R21 Rash and other nonspecific skin eruption: Secondary | ICD-10-CM | POA: Diagnosis not present

## 2017-12-09 LAB — C-REACTIVE PROTEIN: CRP: 0.4 mg/dL — AB (ref 0.5–20.0)

## 2017-12-09 LAB — SEDIMENTATION RATE: Sed Rate: 130 mm/hr — ABNORMAL HIGH (ref 0–30)

## 2017-12-09 NOTE — Progress Notes (Signed)
   Subjective:    Patient ID: Mary [name], female    DOB: Jun 23, 1941, 77 y.o.   MRN: [id number]  HPI The patient is a 77 YO female coming in for several nodules on her skin that are red and painful. She had 1 on her leg without trauma or injury. No bites. The area was hard and red and painful. She used ice on it and within several days it went away. Never drained anything or opened up. Next a nodule appeared on right finger. Same pattern and gone within 3-4 days. She then got another one on her right neck and is partially present today. She has been using ice on the area. Not painful anymore and is small in comparison to the original size. She denies fevers or chills or night sweats with any of this. Recent labs with GI about 2-3 weeks ago after these started before that time.   Review of Systems  Constitutional: Negative.   Respiratory: Negative for cough, chest tightness and shortness of breath.   Cardiovascular: Negative for chest pain, palpitations and leg swelling.  Gastrointestinal: Negative for abdominal distention, abdominal pain, constipation, diarrhea, nausea and vomiting.  Musculoskeletal: Negative.   Skin: Positive for rash.  Neurological: Negative.   Psychiatric/Behavioral: Negative.       Objective:   Physical Exam  Constitutional: She is oriented to person, place, and time. She appears well-developed and well-nourished.  HENT:  Head: Normocephalic and atraumatic.  Eyes: EOM are normal.  Neck: Normal range of motion.  Cardiovascular: Normal rate and regular rhythm.  Pulmonary/Chest: Effort normal and breath sounds normal. No respiratory distress. She has no wheezes. She has no rales.  Abdominal: Soft. Bowel sounds are normal. She exhibits no distension. There is no tenderness. There is no rebound.  Musculoskeletal: She exhibits no edema.  Neurological: She is alert and oriented to person, place, and time. Coordination normal.  Skin: Skin is warm and dry. Rash noted.    Slightly raised red rash on the posterior right neck, no purulent drainage or abscess present.   Psychiatric: She has a normal mood and affect.   Vitals:   12/09/17 0851  BP: 130/80  Pulse: 78  Temp: 98.4 F (36.9 C)  TempSrc: Oral  SpO2: 95%  Weight: 268 lb (121.6 kg)  Height: 5\' 6"  (1.676 m)      Assessment & Plan:

## 2017-12-09 NOTE — Assessment & Plan Note (Signed)
Recent CBC normal so will not repeat. Checking ESR, CRP, blood culture to rule out infection. At this time no indication for antibiotics. Continue to use ice if needed and let us know if any further and we can evaluate for rheum cause.

## 2017-12-09 NOTE — Patient Instructions (Signed)
We will check the blood today to make sure there is no cause for the nodules.

## 2017-12-12 ENCOUNTER — Encounter: Payer: Self-pay | Admitting: Internal Medicine

## 2017-12-15 LAB — CULTURE, BLOOD (SINGLE): MICRO NUMBER:: 90453475

## 2017-12-19 ENCOUNTER — Encounter: Payer: Self-pay | Admitting: Internal Medicine

## 2017-12-19 ENCOUNTER — Ambulatory Visit (INDEPENDENT_AMBULATORY_CARE_PROVIDER_SITE_OTHER): Payer: Medicare Other | Admitting: *Deleted

## 2017-12-19 DIAGNOSIS — I442 Atrioventricular block, complete: Secondary | ICD-10-CM | POA: Diagnosis not present

## 2017-12-19 DIAGNOSIS — R21 Rash and other nonspecific skin eruption: Secondary | ICD-10-CM

## 2017-12-19 NOTE — Progress Notes (Signed)
Remote pacemaker transmission.   

## 2017-12-20 ENCOUNTER — Encounter: Payer: Self-pay | Admitting: Internal Medicine

## 2017-12-20 ENCOUNTER — Other Ambulatory Visit: Payer: Self-pay

## 2017-12-20 ENCOUNTER — Encounter: Payer: Self-pay | Admitting: Cardiology

## 2017-12-20 ENCOUNTER — Ambulatory Visit (AMBULATORY_SURGERY_CENTER): Payer: Medicare Other | Admitting: Internal Medicine

## 2017-12-20 VITALS — BP 103/57 | HR 74 | Temp 98.4°F | Resp 13 | Ht 66.0 in | Wt 268.0 lb

## 2017-12-20 DIAGNOSIS — D128 Benign neoplasm of rectum: Secondary | ICD-10-CM

## 2017-12-20 DIAGNOSIS — D123 Benign neoplasm of transverse colon: Secondary | ICD-10-CM

## 2017-12-20 DIAGNOSIS — Z1211 Encounter for screening for malignant neoplasm of colon: Secondary | ICD-10-CM

## 2017-12-20 DIAGNOSIS — D122 Benign neoplasm of ascending colon: Secondary | ICD-10-CM | POA: Diagnosis not present

## 2017-12-20 DIAGNOSIS — D129 Benign neoplasm of anus and anal canal: Secondary | ICD-10-CM

## 2017-12-20 DIAGNOSIS — Z85038 Personal history of other malignant neoplasm of large intestine: Secondary | ICD-10-CM | POA: Diagnosis present

## 2017-12-20 LAB — CUP PACEART REMOTE DEVICE CHECK
Date Time Interrogation Session: 20190423134007
Implantable Lead Implant Date: 20150506
Implantable Lead Location: 753859
Implantable Lead Location: 753860
MDC IDC LEAD IMPLANT DT: 20150506
MDC IDC PG IMPLANT DT: 20150506
Pulse Gen Serial Number: 3013730

## 2017-12-20 MED ORDER — SODIUM CHLORIDE 0.9 % IV SOLN: 500.0000 mL | Freq: Once | INTRAVENOUS | Status: DC

## 2017-12-20 NOTE — Progress Notes (Signed)
Letter  

## 2017-12-20 NOTE — Progress Notes (Signed)
Spontaneous respirations throughout. VSS. Resting comfortably. To PACU on room air. Report to  RN. 

## 2017-12-20 NOTE — Op Note (Signed)
[name] Procedure Date: 12/20/2017 3:57 PM MRN: [id number] Endoscopist: Docia Chuck. Henrene Pastor , MD Age: 77 Referring MD:  Date of Birth: 1941/01/20 Gender: Female Account #: [id number] Procedure:                Colonoscopy, with cold snare polypectomy x 3 Indications:              High risk colon cancer surveillance: Personal                            history of non-advanced adenoma, High risk colon                            cancer surveillance: Personal history of colon                            cancer. Previous examination 1996 negative, 2003                            tubular adenoma [facility]), splenic flexure colon                            cancer was over 8 2012, negative exam 2013,                            negative exam 2016 Medicines:                Monitored Anesthesia Care Procedure:                Pre-Anesthesia Assessment:                           - Prior to the procedure, a History and Physical                            was performed, and patient medications and                            allergies were reviewed. The patient's tolerance of                            previous anesthesia was also reviewed. The risks                            and benefits of the procedure and the sedation                            options and risks were discussed with the patient.                            All questions were answered, and informed consent                            was obtained. Prior Anticoagulants: The patient has  taken Eliquis (apixaban), last dose was 1 day prior                            to procedure. ASA Grade Assessment: III - A patient                            with severe systemic disease. After reviewing the                            risks and benefits, the patient was deemed in                            satisfactory condition to undergo the procedure.                           After  obtaining informed consent, the colonoscope                            was passed under direct vision. Throughout the                            procedure, the patient's blood pressure, pulse, and                            oxygen saturations were monitored continuously. The                            Colonoscope was introduced through the anus and                            advanced to the the cecum, identified by                            appendiceal orifice and ileocecal valve. The                            ileocecal valve, appendiceal orifice, and rectum                            were photographed. The quality of the bowel                            preparation was excellent. The colonoscopy was                            performed without difficulty. The patient tolerated                            the procedure well. The bowel preparation used was                            SUPREP. Scope In: 4:16:29 PM Scope Out: 4:30:45 PM Scope Withdrawal Time: 0 hours 12 minutes 13 seconds  Total Procedure Duration: 0  hours 14 minutes 16 seconds  Findings:                 Three polyps were found in the rectum, transverse                            colon and ascending colon. The polyps were 1 to 4                            mm in size. These polyps were removed with a cold                            snare. Resection and retrieval were complete.                           A few medium-mouthed diverticula were found in the                            left colon.                           Internal hemorrhoids were found during retroflexion.                           The exam was otherwise without abnormality on                            direct and retroflexion views. Complications:            No immediate complications. Estimated blood loss:                            None. Estimated Blood Loss:     Estimated blood loss: none. Impression:               - Three 1 to 4 mm polyps in the rectum, in  the                            transverse colon and in the ascending colon,                            removed with a cold snare. Resected and retrieved.                           - Diverticulosis in the left colon.                           - Internal hemorrhoids.                           - The examination was otherwise normal on direct                            and retroflexion views. Recommendation:           - Repeat colonoscopy in 5 years for surveillance,  to be considered based on medical fitness and                            willingness.                           - Resume Eliquis (apixaban) today at prior dose.                           - Patient has a contact number available for                            emergencies. The signs and symptoms of potential                            delayed complications were discussed with the                            patient. Return to normal activities tomorrow.                            Written discharge instructions were provided to the                            patient.                           - Resume previous diet.                           - Continue present medications.                           - Await pathology results. Docia Chuck. Henrene Pastor, MD 12/20/2017 4:38:54 PM This report has been signed electronically.

## 2017-12-20 NOTE — Patient Instructions (Signed)
*  Handout on polyps, diverticulosis and hemorrhoids.  YOU HAD AN ENDOSCOPIC PROCEDURE TODAY AT [facility]:   Refer to the procedure report that was given to you for any specific questions about what was found during the examination.  If the procedure report does not answer your questions, please call your gastroenterologist to clarify.  If you requested that your care partner not be given the details of your procedure findings, then the procedure report has been included in a sealed envelope for you to review at your convenience later.  YOU SHOULD EXPECT: Some feelings of bloating in the abdomen. Passage of more gas than usual.  Walking can help get rid of the air that was put into your GI tract during the procedure and reduce the bloating. If you had a lower endoscopy (such as a colonoscopy or flexible sigmoidoscopy) you may notice spotting of blood in your stool or on the toilet paper. If you underwent a bowel prep for your procedure, you may not have a normal bowel movement for a few days.  Please Note:  You might notice some irritation and congestion in your nose or some drainage.  This is from the oxygen used during your procedure.  There is no need for concern and it should clear up in a day or so.  SYMPTOMS TO REPORT IMMEDIATELY:   Following lower endoscopy (colonoscopy or flexible sigmoidoscopy):  Excessive amounts of blood in the stool  Significant tenderness or worsening of abdominal pains  Swelling of the abdomen that is new, acute  Fever of 100F or higher   For urgent or emergent issues, a gastroenterologist can be reached at any hour by calling [phone].   DIET:  We do recommend a small meal at first, but then you may proceed to your regular diet.  Drink plenty of fluids but you should avoid alcoholic beverages for 24 hours.  ACTIVITY:  You should plan to take it easy for the rest of today and you should NOT DRIVE or use heavy machinery until tomorrow  (because of the sedation medicines used during the test).    FOLLOW UP: Our staff will call the number listed on your records the next business day following your procedure to check on you and address any questions or concerns that you may have regarding the information given to you following your procedure. If we do not reach you, we will leave a message.  However, if you are feeling well and you are not experiencing any problems, there is no need to return our call.  We will assume that you have returned to your regular daily activities without incident.  If any biopsies were taken you will be contacted by phone or by letter within the next 1-3 weeks.  Please call us at [phone] if you have not heard about the biopsies in 3 weeks.    SIGNATURES/CONFIDENTIALITY: You and/or your care partner have signed paperwork which will be entered into your electronic medical record.  These signatures attest to the fact that that the information above on your After Visit Summary has been reviewed and is understood.  Full responsibility of the confidentiality of this discharge information lies with you and/or your care-partner.

## 2017-12-20 NOTE — Progress Notes (Signed)
Called to room to assist during endoscopic procedure.  Patient ID and intended procedure confirmed with present staff. Received instructions for my participation in the procedure from the performing physician.  

## 2017-12-21 ENCOUNTER — Telehealth: Payer: Self-pay | Admitting: *Deleted

## 2017-12-21 NOTE — Telephone Encounter (Signed)
  Follow up Call-  Call back number 12/20/2017  Post procedure Call Back phone  # [phone]  Permission to leave phone message Yes  Some recent data might be hidden     Patient questions:  Do you have a fever, pain , or abdominal swelling? No. Pain Score  0 *  Have you tolerated food without any problems? Yes.    Have you been able to return to your normal activities? Yes.    Do you have any questions about your discharge instructions: Diet   No. Medications  No. Follow up visit  No.  Do you have questions or concerns about your Care? No.  Actions: * If pain score is 4 or above: No action needed, pain <4.

## 2017-12-26 ENCOUNTER — Encounter: Payer: Self-pay | Admitting: Internal Medicine

## 2017-12-28 ENCOUNTER — Other Ambulatory Visit: Payer: Self-pay

## 2017-12-28 DIAGNOSIS — D649 Anemia, unspecified: Secondary | ICD-10-CM

## 2018-01-05 ENCOUNTER — Other Ambulatory Visit (INDEPENDENT_AMBULATORY_CARE_PROVIDER_SITE_OTHER): Payer: Medicare Other

## 2018-01-05 DIAGNOSIS — R21 Rash and other nonspecific skin eruption: Secondary | ICD-10-CM | POA: Diagnosis not present

## 2018-01-05 LAB — CK: CK TOTAL: 43 U/L (ref 7–177)

## 2018-01-08 LAB — ANA, IFA COMPREHENSIVE PANEL
ANA: NEGATIVE
ENA SM Ab Ser-aCnc: <1 AI
SCLERODERMA (SCL-70) (ENA) ANTIBODY, IGG: NEGATIVE AI
SM/RNP: <1 AI
SSA (Ro) (ENA) Antibody, IgG: <1 AI
SSB (LA) (ENA) ANTIBODY, IGG: NEGATIVE AI
ds DNA Ab: <1 IU/mL

## 2018-01-08 LAB — RHEUMATOID FACTOR

## 2018-01-09 ENCOUNTER — Encounter: Payer: Self-pay | Admitting: Internal Medicine

## 2018-01-10 ENCOUNTER — Other Ambulatory Visit: Payer: Self-pay | Admitting: Internal Medicine

## 2018-01-19 DIAGNOSIS — E611 Iron deficiency: Secondary | ICD-10-CM | POA: Diagnosis not present

## 2018-01-19 DIAGNOSIS — Z8639 Personal history of other endocrine, nutritional and metabolic disease: Secondary | ICD-10-CM | POA: Diagnosis not present

## 2018-01-19 DIAGNOSIS — E039 Hypothyroidism, unspecified: Secondary | ICD-10-CM | POA: Diagnosis not present

## 2018-01-19 DIAGNOSIS — I509 Heart failure, unspecified: Secondary | ICD-10-CM | POA: Diagnosis not present

## 2018-01-19 DIAGNOSIS — E1142 Type 2 diabetes mellitus with diabetic polyneuropathy: Secondary | ICD-10-CM | POA: Diagnosis not present

## 2018-01-19 DIAGNOSIS — E049 Nontoxic goiter, unspecified: Secondary | ICD-10-CM | POA: Diagnosis not present

## 2018-01-19 DIAGNOSIS — Z794 Long term (current) use of insulin: Secondary | ICD-10-CM | POA: Diagnosis not present

## 2018-01-24 ENCOUNTER — Other Ambulatory Visit: Payer: Self-pay

## 2018-01-24 MED ORDER — FUROSEMIDE 80 MG PO TABS: 80.0000 mg | ORAL_TABLET | Freq: Every day | ORAL | 2 refills | Status: DC

## 2018-01-30 ENCOUNTER — Other Ambulatory Visit: Payer: Self-pay | Admitting: Internal Medicine

## 2018-01-30 ENCOUNTER — Encounter: Payer: Self-pay | Admitting: Internal Medicine

## 2018-02-03 ENCOUNTER — Other Ambulatory Visit: Payer: Self-pay | Admitting: Interventional Cardiology

## 2018-02-03 ENCOUNTER — Ambulatory Visit (INDEPENDENT_AMBULATORY_CARE_PROVIDER_SITE_OTHER): Payer: Medicare Other | Admitting: Interventional Cardiology

## 2018-02-03 ENCOUNTER — Encounter: Payer: Self-pay | Admitting: Interventional Cardiology

## 2018-02-03 VITALS — BP 104/82 | HR 102 | Ht 67.0 in | Wt 273.0 lb

## 2018-02-03 DIAGNOSIS — I5032 Chronic diastolic (congestive) heart failure: Secondary | ICD-10-CM

## 2018-02-03 DIAGNOSIS — I10 Essential (primary) hypertension: Secondary | ICD-10-CM

## 2018-02-03 DIAGNOSIS — I25118 Atherosclerotic heart disease of native coronary artery with other forms of angina pectoris: Secondary | ICD-10-CM

## 2018-02-03 DIAGNOSIS — E118 Type 2 diabetes mellitus with unspecified complications: Secondary | ICD-10-CM | POA: Diagnosis not present

## 2018-02-03 DIAGNOSIS — Z6838 Body mass index (BMI) 38.0-38.9, adult: Secondary | ICD-10-CM

## 2018-02-03 DIAGNOSIS — Z720 Tobacco use: Secondary | ICD-10-CM

## 2018-02-03 DIAGNOSIS — Z950 Presence of cardiac pacemaker: Secondary | ICD-10-CM | POA: Diagnosis not present

## 2018-02-03 DIAGNOSIS — I480 Paroxysmal atrial fibrillation: Secondary | ICD-10-CM | POA: Diagnosis not present

## 2018-02-03 NOTE — Patient Instructions (Signed)

## 2018-02-03 NOTE — Progress Notes (Signed)
Cardiology Office Note    Date:  02/03/2018   ID:  Mary [name], DOB Feb 12, 1941, MRN [id number]  PCP:  Hoyt Koch, MD  Cardiologist: Sinclair Grooms, MD   No chief complaint on file.   History of Present Illness:  Mary [name] is a 77 y.o. 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   Recent device telemetry demonstrates occasional ectopic atrial rhythm.  Difficult to tell how she is doing.  She is chronically short of breath and has decreased energy.  This past Sunday she noted that her heart rate was chronically above 100 bpm.  Blood pressure has been relatively soft for her in the 568 mmHg systolic range.  She has been having migratory skin lesions that have not been defined.  She denies orthopnea.  She is not having angina.  She has not had syncope.  She has gained weight.  She wonders if she has fluid.  Today's weight is elevated compared with April.  She is 273 pounds versus 268 pounds.   Past Medical History:  Diagnosis Date  . Arthritis   . Atrial tachycardia ([place]) 04/23/2017  . CAD (coronary artery disease)    non obstructive  . 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 Procedure complicated by an atrial lead dislodgment and repeat procedure, 12/2013   . Carotid stenosis    Carotid US 5/16:  Bilateral ICA 1-39%; L vertebral retrograde; L BP 126/49, R BP 140/57  . CHF (congestive heart failure) ([place])   . Chronic lower back pain   . Colon cancer ([place])    a. s/p chemo  . Colon polyps   . Diabetic peripheral neuropathy ([place])   . DVT (deep venous thrombosis) ([place]) 2013   "twice behind knee on left side" (06/25/2013)  . Glaucoma   . Hyperlipidemia   . Hypertension   . Hypothyroidism    . Iron deficiency anemia   . Multinodular goiter   . Osteoporosis   . Sleep apnea    a. resolved post weight loss   . Type II diabetes mellitus ([place])     Past Surgical History:  Procedure Laterality Date  . CARDIAC CATHETERIZATION  2006   Never had PCI, 3 caths total. Last one in [place]  . CARDIAC CATHETERIZATION N/A 02/04/2015   Procedure: Left Heart Cath and Coronary Angiography;  Surgeon: Belva Crome, MD;  Location: [facility];  Service: Cardiovascular;  Laterality: N/A;  . CATARACT EXTRACTION, BILATERAL Bilateral 2013   "and put stent in my left eye for glaucoma" (06/25/2013)  . CHOLECYSTECTOMY  1980's  . COLECTOMY  10/2010   Tumor removal  . EYE MUSCLE SURGERY Bilateral ~ 1963   "muscles too long; eyes would go out and up; tied muscles to hold my eyes straight" (06/25/2013)  . LEAD REVISION  01-03-2014   atrial lead revision by Dr Rayann Heman  . LEAD REVISION N/A 01/03/2014   Procedure: LEAD REVISION;  Surgeon: Coralyn Mark, MD;  Location: [facility];  Service: Cardiovascular;  Laterality: N/A;  . PACEMAKER INSERTION  01/02/2014   STJ Assurity dual chamber pacemaker implnated by Dr Lovena Le for SSS  . PERMANENT PACEMAKER INSERTION N/A 01/02/2014   Procedure:  PERMANENT PACEMAKER INSERTION;  Surgeon: Evans Lance, MD;  Location: [facility];  Service: Cardiovascular;  Laterality: N/A;  . PORTACATH PLACEMENT Left 11/2010  . ROUX-EN-Y GASTRIC BYPASS  2009  . ULNAR TUNNEL RELEASE Left ~ 2008  . UMBILICAL HERNIA REPAIR  1970's?    (06/25/2013)    Current Medications: Outpatient Medications Prior to Visit  Medication Sig Dispense Refill  . atorvastatin (LIPITOR) 80 MG tablet TAKE 1 TABLET BY MOUTH EVERY DAY 30 tablet 0  . bimatoprost (LUMIGAN) 0.01 % SOLN Place 1 drop into the right eye at bedtime.     Marland Kitchen buPROPion (WELLBUTRIN XL) 150 MG 24 hr tablet Take 1 tablet (150 mg total) by mouth daily. Need annual visit for further refills 30 tablet 0  . ELIQUIS 5 MG TABS tablet TAKE  1 TABLET BY MOUTH TWICE A DAY 180 tablet 3  . flecainide (TAMBOCOR) 50 MG tablet Take 1 tablet (50 mg total) by mouth 2 (two) times daily. 60 tablet 10  . furosemide (LASIX) 80 MG tablet Take 1 tablet (80 mg total) by mouth daily. 30 tablet 2  . gabapentin (NEURONTIN) 300 MG capsule Take 300 mg by mouth at bedtime.     Marland Kitchen HYDROcodone-acetaminophen (NORCO/VICODIN) 5-325 MG tablet Take 1 tablet by mouth every 4 (four) hours as needed for moderate pain. 30 tablet 0  . Insulin Glargine (BASAGLAR KWIKPEN) 100 UNIT/ML SOPN Inject 64 Units into the skin daily.  12  . levothyroxine (SYNTHROID, LEVOTHROID) 125 MCG tablet Take 125 mcg by mouth daily before breakfast.  5  . metoprolol succinate (TOPROL XL) 25 MG 24 hr tablet Take 1 tablet (25 mg total) by mouth at bedtime. 90 tablet 3  . nitroGLYCERIN (NITROSTAT) 0.4 MG SL tablet Place 1 tablet (0.4 mg total) under the tongue every 5 (five) minutes as needed for chest pain. 75 tablet 2  . potassium chloride SA (K-DUR,KLOR-CON) 20 MEQ tablet TAKE 2 TABLETS BY MOUTH ONCE DAILY 180 tablet 2  . levothyroxine (SYNTHROID, LEVOTHROID) 112 MCG tablet Take 112 mcg by mouth daily before breakfast.    . Insulin Glargine (BASAGLAR KWIKPEN [place]) Inject 48 Units into the skin.     Facility-Administered Medications Prior to Visit  Medication Dose Route Frequency Provider Last Rate Last Dose  . 0.9 %  sodium chloride infusion  500 mL Intravenous Once Irene Shipper, MD         Allergies:   Other; Adhesive [tape]; Aspirin; Latex; and Nsaids   Social History   Socioeconomic History  . Marital status: Married    Spouse name: [name]  . Number of children: 0  . Years of education: Not on file  . Highest education level: Not on file  Occupational History  . Occupation: Retired    Comment: office work  Scientific laboratory technician  . Financial resource strain: Not very hard  . Food insecurity:    Worry: Never true    Inability: Never true  . Transportation needs:    Medical: No     Non-medical: No  Tobacco Use  . Smoking status: Former Smoker    Packs/day: 1.00    Years: 30.00    Pack years: 30.00    Types: Cigarettes    Last attempt to quit: 02/2017    Years since quitting: 0.9  . Smokeless tobacco: Never Used  . Tobacco comment: 06/25/2013 "smoked off and on since I was 22; stopped 10/2007 til 08/30/2012 then started again cause of stress"  Substance and Sexual Activity  .  Alcohol use: Not Currently    Alcohol/week: 0.0 oz  . Drug use: No  . Sexual activity: Yes  Lifestyle  . Physical activity:    Days per week: 0 days    Minutes per session: 0 min  . Stress: Only a little  Relationships  . Social connections:    Talks on phone: More than three times a week    Gets together: More than three times a week    Attends religious service: More than 4 times per year    Active member of club or organization: Yes    Attends meetings of clubs or organizations: More than 4 times per year    Relationship status: Married  Other Topics Concern  . Not on file  Social History Narrative   Married to husband, [name]   No children   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   Recently moved back here from [place]     Family History:  The patient's family history includes Bladder Cancer in her brother and cousin; Breast cancer in her maternal aunt, mother, and sister; Clotting disorder in her sister; Colon polyps in her brother and brother; Diabetes in her maternal grandmother, mother, and sister; Heart attack in her brother, father, and sister; Heart disease in her father; Pancreatic cancer in her brother; Prostate cancer in her brother.   ROS:   Please see the history of present illness.    Shortness of breath, orthopnea, dyspnea on exertion, lower extremity swelling, chest pressure, irregular heartbeat, joint swelling, balance issues, back pain, muscle pain, rash, dizziness, and easy bruising. All other systems reviewed and are  negative.   PHYSICAL EXAM:   VS:  BP 104/82   Pulse (!) 102   Ht 5\' 7"  (1.702 m)   Wt 273 lb (123.8 kg)   BMI 42.76 kg/m    GEN: Well nourished, well developed, in no acute distress  HEENT: normal  Neck: no JVD, carotid bruits, or masses Cardiac: RRR; no murmurs, rubs, or gallops,no edema  Respiratory:  clear to auscultation bilaterally, normal work of breathing GI: soft, nontender, nondistended, + BS MS: no deformity or atrophy  Skin: warm and dry, no rash Neuro:  Alert and Oriented x 3, Strength and sensation are intact Psych: euthymic mood, full affect  Wt Readings from Last 3 Encounters:  02/03/18 273 lb (123.8 kg)  12/20/17 268 lb (121.6 kg)  12/09/17 268 lb (121.6 kg)      Studies/Labs Reviewed:   EKG:  EKG paced ventricular rhythm heart rate 100 bpm.  Typical heart rate when the sinus rhythm in the 70s.  When compared to 11/02/2017 the only change is an increase in heart rate.  Recent Labs: 04/23/2017: Magnesium 2.0; TSH 6.855 04/25/2017: BUN 21; Creatinine, Ser 1.33; Potassium 3.9; Sodium 136 11/16/2017: Hemoglobin 11.6; Platelets 358.0   Lipid Panel    Component Value Date/Time   CHOL 178 08/02/2016 0102   TRIG 229 (H) 08/02/2016 0102   HDL 42 08/02/2016 0102   CHOLHDL 4.2 08/02/2016 0102   VLDL 46 (H) 08/02/2016 0102   LDLCALC 90 08/02/2016 0102   LDLDIRECT 79.0 03/23/2016 1529    Additional studies/ records that were reviewed today include:  None    ASSESSMENT:    1. PAF (paroxysmal atrial fibrillation) ([place])   2. Coronary artery disease of native artery of native heart with stable angina pectoris ([place])   3. Hyperlipidemia, unspecified hyperlipidemia type   4. Essential hypertension   5.  Class 2 severe obesity due to excess calories with serious comorbidity and body mass index (BMI) of 38.0 to 38.9 in adult ([place])   6. Tobacco abuse   7. Diabetes mellitus with complication ([place])   8. Chronic diastolic heart failure ([facility])   9. Cardiac pacemaker in  situ - St Jude      PLAN:  In order of problems listed above:  1. Not in atrial fibrillation but does have tachycardia at rates slightly above 100 bpm.  In reviewing the recent EP note, there is history of an ectopic atrial tachycardia.  She is not having any obvious decompensation although since March her weight has increased nearly 20 pounds.  Her husband states that caloric intake is increased. 2. No anginal quality pain. 3. Blood pressure elevation is not a current issue. 4. No obvious evidence of volume overload on exam. 5. Encouraged decrease caloric intake 6. She has successfully discontinued cigarette smoking now for approximately 1 year 7. Not addressed 8. She has an EP appointment next week.  Device will be re-interrogated and this will give some idea of duration/amount of ectopic atrial tachycardia.  Clinical follow-up of ischemic heart disease and heart failure in 6 to 12 months.  May need to have ectopic atrial pacemaker rhythm/tachycardia addressed if burden is great.  Medication Adjustments/Labs and Tests Ordered: Current medicines are reviewed at length with the patient today.  Concerns regarding medicines are outlined above.  Medication changes, Labs and Tests ordered today are listed in the Patient Instructions below. Patient Instructions  Medication Instructions:  Your physician recommends that you continue on your current medications as directed. Please refer to the Current Medication list given to you today.  Labwork: None  Testing/Procedures: None  Follow-Up: Your physician wants you to follow-up in: 1 year with Dr. [name].  You will receive a reminder letter in the mail two months in advance. If you don't receive a letter, please call our office to schedule the follow-up appointment.   Any Other Special Instructions Will Be Listed Below (If Applicable).     If you need a refill on your cardiac medications before your next appointment, please call your  pharmacy.      Signed, Sinclair Grooms, MD  02/03/2018 4:54 PM    [facility] [place], [place], [place]  [address] Phone: [phone]; Fax: [phone]

## 2018-02-08 ENCOUNTER — Ambulatory Visit (INDEPENDENT_AMBULATORY_CARE_PROVIDER_SITE_OTHER): Payer: Medicare Other | Admitting: Internal Medicine

## 2018-02-08 ENCOUNTER — Encounter: Payer: Self-pay | Admitting: Internal Medicine

## 2018-02-08 VITALS — BP 124/76 | HR 104 | Ht 67.0 in | Wt 273.0 lb

## 2018-02-08 DIAGNOSIS — I480 Paroxysmal atrial fibrillation: Secondary | ICD-10-CM

## 2018-02-08 DIAGNOSIS — Z950 Presence of cardiac pacemaker: Secondary | ICD-10-CM

## 2018-02-08 DIAGNOSIS — I442 Atrioventricular block, complete: Secondary | ICD-10-CM

## 2018-02-08 DIAGNOSIS — I25118 Atherosclerotic heart disease of native coronary artery with other forms of angina pectoris: Secondary | ICD-10-CM

## 2018-02-08 LAB — CUP PACEART INCLINIC DEVICE CHECK
Date Time Interrogation Session: 20190612180752
Implantable Lead Implant Date: 20150506
Implantable Lead Location: 753859
Implantable Lead Location: 753860
Lead Channel Impedance Value: 362.5 Ohm
Lead Channel Pacing Threshold Pulse Width: 0.4 ms
Lead Channel Sensing Intrinsic Amplitude: 12 mV
Lead Channel Setting Pacing Amplitude: 2.5 V
Lead Channel Setting Pacing Pulse Width: 0.4 ms
MDC IDC LEAD IMPLANT DT: 20150506
MDC IDC MSMT BATTERY REMAINING LONGEVITY: 88 mo
MDC IDC MSMT BATTERY VOLTAGE: 2.98 V
MDC IDC MSMT LEADCHNL RA PACING THRESHOLD AMPLITUDE: 1.25 V
MDC IDC MSMT LEADCHNL RA PACING THRESHOLD PULSEWIDTH: 0.4 ms
MDC IDC MSMT LEADCHNL RA SENSING INTR AMPL: 1.2 mV
MDC IDC MSMT LEADCHNL RV IMPEDANCE VALUE: 412.5 Ohm
MDC IDC MSMT LEADCHNL RV PACING THRESHOLD AMPLITUDE: 0.75 V
MDC IDC PG IMPLANT DT: 20150506
MDC IDC PG SERIAL: 3013730
MDC IDC SET LEADCHNL RV PACING AMPLITUDE: 2.5 V
MDC IDC SET LEADCHNL RV SENSING SENSITIVITY: 4 mV
MDC IDC STAT BRADY RA PERCENT PACED: 56 %
MDC IDC STAT BRADY RV PERCENT PACED: 99.95 %
Pulse Gen Model: 2240

## 2018-02-08 MED ORDER — FLECAINIDE ACETATE 50 MG PO TABS: 75.0000 mg | ORAL_TABLET | Freq: Two times a day (BID) | ORAL | 10 refills | Status: DC

## 2018-02-08 NOTE — Patient Instructions (Addendum)
Medication Instructions:  Your physician has recommended you make the following change in your medication:   INCREASE: flecainide to 75 mg twice a day  Labwork: None ordered  Testing/Procedures: None ordered  Follow-Up: Remote monitoring is used to monitor your Pacemaker from home. This monitoring reduces the number of office visits required to check your device to one time per year. It allows [place] to keep an eye on the functioning of your device to ensure it is working properly. You are scheduled for a device check from home on 03/20/18. You may send your transmission at any time that day. If you have a wireless device, the transmission will be sent automatically. After your physician reviews your transmission, you will receive a postcard with your next transmission date.    Your physician recommends that you schedule a follow-up appointment in: 2 weeks with Dr. Rayann Heman    Any Other Special Instructions Will Be Listed Below (If Applicable).     If you need a refill on your cardiac medications before your next appointment, please call your pharmacy.

## 2018-02-08 NOTE — Progress Notes (Signed)
PCP: Hoyt Koch, MD Primary Cardiologist: Dr [name] Primary EP:  Dr Rayann Heman  Mary [name] is a 77 y.o. female who presents today for routine electrophysiology followup.  Since last being seen in our clinic, the patient reports doing reasonably well.  Not very active due to DJD/DDD.  Weight continues to increase due to dietary noncompliance.  Heart rates have been more elevated recently.  She has some fatigue with this but overall appears to be doing about the same as usual.  + chronic SOB and edema.  Today, she denies symptoms of palpitations, chest pain,  dizziness, presyncope, or syncope.  The patient is otherwise without complaint today.   Past Medical History:  Diagnosis Date  . Arthritis   . Atrial tachycardia ([place]) 04/23/2017  . CAD (coronary artery disease)    non obstructive  . 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 Procedure complicated by an atrial lead dislodgment and repeat procedure, 12/2013   . Carotid stenosis    Carotid US 5/16:  Bilateral ICA 1-39%; L vertebral retrograde; L BP 126/49, R BP 140/57  . CHF (congestive heart failure) ([place])   . Chronic lower back pain   . Colon cancer ([place])    a. s/p chemo  . Colon polyps   . Diabetic peripheral neuropathy ([place])   . DVT (deep venous thrombosis) ([place]) 2013   "twice behind knee on left side" (06/25/2013)  . Glaucoma   . Hyperlipidemia   . Hypertension   . Hypothyroidism   . Iron deficiency anemia   . Multinodular goiter   . Osteoporosis   . Sleep apnea    a. resolved post weight loss   . Type II diabetes mellitus ([place])    Past Surgical History:  Procedure Laterality Date  . CARDIAC CATHETERIZATION  2006   Never had PCI, 3 caths total. Last one in [place]  . CARDIAC CATHETERIZATION N/A 02/04/2015   Procedure: Left Heart Cath and Coronary Angiography;  Surgeon: Belva Crome, MD;  Location: [facility];  Service: Cardiovascular;   Laterality: N/A;  . CATARACT EXTRACTION, BILATERAL Bilateral 2013   "and put stent in my left eye for glaucoma" (06/25/2013)  . CHOLECYSTECTOMY  1980's  . COLECTOMY  10/2010   Tumor removal  . EYE MUSCLE SURGERY Bilateral ~ 1963   "muscles too long; eyes would go out and up; tied muscles to hold my eyes straight" (06/25/2013)  . LEAD REVISION  01-03-2014   atrial lead revision by Dr Rayann Heman  . LEAD REVISION N/A 01/03/2014   Procedure: LEAD REVISION;  Surgeon: Coralyn Mark, MD;  Location: [facility];  Service: Cardiovascular;  Laterality: N/A;  . PACEMAKER INSERTION  01/02/2014   STJ Assurity dual chamber pacemaker implnated by Dr Lovena Le for SSS  . PERMANENT PACEMAKER INSERTION N/A 01/02/2014   Procedure: PERMANENT PACEMAKER INSERTION;  Surgeon: Evans Lance, MD;  Location: [facility];  Service: Cardiovascular;  Laterality: N/A;  . PORTACATH PLACEMENT Left 11/2010  . ROUX-EN-Y GASTRIC BYPASS  2009  . ULNAR TUNNEL RELEASE Left ~ 2008  . UMBILICAL HERNIA REPAIR  1970's?    (06/25/2013)    ROS- all systems are reviewed and negative except as per HPI above  Current Outpatient Medications  Medication Sig Dispense Refill  . atorvastatin (LIPITOR) 80 MG tablet TAKE 1 TABLET BY MOUTH EVERY DAY 30 tablet 0  . bimatoprost (LUMIGAN) 0.01 % SOLN Place 1 drop  into the right eye at bedtime.     Marland Kitchen buPROPion (WELLBUTRIN XL) 150 MG 24 hr tablet Take 1 tablet (150 mg total) by mouth daily. Need annual visit for further refills 30 tablet 0  . ELIQUIS 5 MG TABS tablet TAKE 1 TABLET BY MOUTH TWICE A DAY 180 tablet 3  . flecainide (TAMBOCOR) 50 MG tablet Take 1 tablet (50 mg total) by mouth 2 (two) times daily. 60 tablet 10  . furosemide (LASIX) 80 MG tablet Take 1 tablet (80 mg total) by mouth daily. 30 tablet 2  . gabapentin (NEURONTIN) 300 MG capsule Take 300 mg by mouth at bedtime.     Marland Kitchen HYDROcodone-acetaminophen (NORCO/VICODIN) 5-325 MG tablet Take 1 tablet by mouth every 4 (four) hours as needed for  moderate pain. 30 tablet 0  . Insulin Glargine (BASAGLAR KWIKPEN) 100 UNIT/ML SOPN Inject 64 Units into the skin daily.  12  . levothyroxine (SYNTHROID, LEVOTHROID) 125 MCG tablet Take 125 mcg by mouth daily before breakfast.  5  . metoprolol succinate (TOPROL XL) 25 MG 24 hr tablet Take 1 tablet (25 mg total) by mouth at bedtime. 90 tablet 3  . nitroGLYCERIN (NITROSTAT) 0.4 MG SL tablet Place 1 tablet (0.4 mg total) under the tongue every 5 (five) minutes as needed for chest pain. 75 tablet 2  . potassium chloride SA (K-DUR,KLOR-CON) 20 MEQ tablet TAKE 2 TABLETS BY MOUTH ONCE DAILY 180 tablet 2   Current Facility-Administered Medications  Medication Dose Route Frequency Provider Last Rate Last Dose  . 0.9 %  sodium chloride infusion  500 mL Intravenous Once Irene Shipper, MD        Physical Exam: Vitals:   02/08/18 1424  BP: 124/76  Pulse: (!) 104  Weight: 273 lb (123.8 kg)  Height: 5\' 7"  (1.702 m)    GEN- The patient is overweight and chronically ill appearing, alert and oriented x 3 today.  Walks slowly with a cane Head- normocephalic, atraumatic Eyes-  Sclera clear, conjunctiva pink Ears- hearing intact Oropharynx- clear Lungs- Clear to ausculation bilaterally, normal work of breathing Chest- pacemaker pocket is well healed Heart- Regular rate and rhythm, no murmurs, rubs or gallops, PMI not laterally displaced GI- soft, NT, ND, + BS Extremities- no clubbing, cyanosis, +edema  Pacemaker interrogation- reviewed in detail today,  See PACEART report ekg 02/03/18 reveals V pacing at 100 bpm   Assessment and Plan:  1. 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  Reduced atrial sensitivity to 0.5 today  2. Ectopic atrial tachycardia/ atrial fibrillation Increased tachycardia burden by histogram Increase flecainide to 75mg  BID today On  eliquis  3. HTN Stable No change required today  4. Obesity Body mass index is 42.76 kg/m. Wt Readings from Last 3 Encounters:  02/08/18 273 lb (123.8 kg)  02/03/18 273 lb (123.8 kg)  12/20/17 268 lb (121.6 kg)   Merlin Return in 2 weeks to reassess tachycardia   [name] MD, [facility] 02/08/2018 2:32 PM

## 2018-02-17 ENCOUNTER — Other Ambulatory Visit: Payer: Self-pay | Admitting: Internal Medicine

## 2018-02-22 ENCOUNTER — Encounter: Payer: Self-pay | Admitting: Internal Medicine

## 2018-02-22 ENCOUNTER — Ambulatory Visit (INDEPENDENT_AMBULATORY_CARE_PROVIDER_SITE_OTHER): Payer: Medicare Other | Admitting: Internal Medicine

## 2018-02-22 VITALS — BP 124/74 | HR 73 | Ht 67.0 in | Wt 277.0 lb

## 2018-02-22 DIAGNOSIS — Z950 Presence of cardiac pacemaker: Secondary | ICD-10-CM | POA: Diagnosis not present

## 2018-02-22 DIAGNOSIS — I25118 Atherosclerotic heart disease of native coronary artery with other forms of angina pectoris: Secondary | ICD-10-CM

## 2018-02-22 DIAGNOSIS — I10 Essential (primary) hypertension: Secondary | ICD-10-CM

## 2018-02-22 DIAGNOSIS — I480 Paroxysmal atrial fibrillation: Secondary | ICD-10-CM

## 2018-02-22 DIAGNOSIS — I471 Supraventricular tachycardia: Secondary | ICD-10-CM | POA: Diagnosis not present

## 2018-02-22 DIAGNOSIS — I442 Atrioventricular block, complete: Secondary | ICD-10-CM

## 2018-02-22 NOTE — Patient Instructions (Addendum)
Medication Instructions:  Your physician recommends that you continue on your current medications as directed. Please refer to the Current Medication list given to you today.  Labwork: None ordered.  Testing/Procedures: None ordered.  Follow-Up: Your physician wants you to follow-up in: 3 months with Chanetta Marshall, NP.     Remote monitoring is used to monitor your Pacemaker from home. This monitoring reduces the number of office visits required to check your device to one time per year. It allows [place] to keep an eye on the functioning of your device to ensure it is working properly. You are scheduled for a device check from home on 03/20/2018. You may send your transmission at any time that day. If you have a wireless device, the transmission will be sent automatically. After your physician reviews your transmission, you will receive a postcard with your next transmission date.  Any Other Special Instructions Will Be Listed Below (If Applicable).  If you need a refill on your cardiac medications before your next appointment, please call your pharmacy.

## 2018-02-22 NOTE — Progress Notes (Signed)
PCP: Hoyt Koch, MD Primary Cardiologist: Dr [name] Primary EP:  Dr Rayann Heman  Mary [name] is a 77 y.o. female who presents today for routine electrophysiology followup.  Since last being seen in our clinic, the patient reports doing very well.  Today, she denies symptoms of palpitations, chest pain, presyncope, or syncope. SOB and edema are stable.  She is not very active.  Energy is a little better.  The patient is otherwise without complaint today.   Past Medical History:  Diagnosis Date  . Arthritis   . Atrial tachycardia ([place]) 04/23/2017  . CAD (coronary artery disease)    non obstructive  . 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 Procedure complicated by an atrial lead dislodgment and repeat procedure, 12/2013   . Carotid stenosis    Carotid US 5/16:  Bilateral ICA 1-39%; L vertebral retrograde; L BP 126/49, R BP 140/57  . CHF (congestive heart failure) ([place])   . Chronic lower back pain   . Colon cancer ([place])    a. s/p chemo  . Colon polyps   . Diabetic peripheral neuropathy ([place])   . DVT (deep venous thrombosis) ([place]) 2013   "twice behind knee on left side" (06/25/2013)  . Glaucoma   . Hyperlipidemia   . Hypertension   . Hypothyroidism   . Iron deficiency anemia   . Multinodular goiter   . Osteoporosis   . Sleep apnea    a. resolved post weight loss   . Type II diabetes mellitus ([place])    Past Surgical History:  Procedure Laterality Date  . CARDIAC CATHETERIZATION  2006   Never had PCI, 3 caths total. Last one in [place]  . CARDIAC CATHETERIZATION N/A 02/04/2015   Procedure: Left Heart Cath and Coronary Angiography;  Surgeon: Belva Crome, MD;  Location: [place] [facility];  Service: Cardiovascular;  Laterality: N/A;  . CATARACT EXTRACTION, BILATERAL Bilateral 2013   "and put stent in my left eye for glaucoma" (06/25/2013)  . CHOLECYSTECTOMY  1980's  . COLECTOMY  10/2010   Tumor removal  . EYE  MUSCLE SURGERY Bilateral ~ 1963   "muscles too long; eyes would go out and up; tied muscles to hold my eyes straight" (06/25/2013)  . LEAD REVISION  01-03-2014   atrial lead revision by Dr Rayann Heman  . LEAD REVISION N/A 01/03/2014   Procedure: LEAD REVISION;  Surgeon: Coralyn Mark, MD;  Location: [facility] CATH LAB;  Service: Cardiovascular;  Laterality: N/A;  . PACEMAKER INSERTION  01/02/2014   STJ Assurity dual chamber pacemaker implnated by Dr Lovena Le for SSS  . PERMANENT PACEMAKER INSERTION N/A 01/02/2014   Procedure: PERMANENT PACEMAKER INSERTION;  Surgeon: Evans Lance, MD;  Location: [facility] CATH LAB;  Service: Cardiovascular;  Laterality: N/A;  . PORTACATH PLACEMENT Left 11/2010  . ROUX-EN-Y GASTRIC BYPASS  2009  . ULNAR TUNNEL RELEASE Left ~ 2008  . UMBILICAL HERNIA REPAIR  1970's?    (06/25/2013)    ROS- all systems are reviewed and negative except as per HPI above  Current Outpatient Medications  Medication Sig Dispense Refill  . atorvastatin (LIPITOR) 80 MG tablet TAKE 1 TABLET BY MOUTH EVERY DAY 30 tablet 0  . bimatoprost (LUMIGAN) 0.01 % SOLN Place 1 drop into the right eye at bedtime.     Marland Kitchen buPROPion (WELLBUTRIN XL) 150 MG 24 hr tablet Take 1 tablet (150 mg total) by mouth daily. Need annual visit for further  refills 30 tablet 0  . ELIQUIS 5 MG TABS tablet TAKE 1 TABLET BY MOUTH TWICE A DAY 180 tablet 3  . flecainide (TAMBOCOR) 50 MG tablet Take 1.5 tablets (75 mg total) by mouth 2 (two) times daily. 90 tablet 10  . furosemide (LASIX) 80 MG tablet Take 1 tablet (80 mg total) by mouth daily. 30 tablet 2  . gabapentin (NEURONTIN) 300 MG capsule Take 300 mg by mouth at bedtime.     Marland Kitchen HYDROcodone-acetaminophen (NORCO/VICODIN) 5-325 MG tablet Take 1 tablet by mouth every 4 (four) hours as needed for moderate pain. 30 tablet 0  . Insulin Glargine (BASAGLAR KWIKPEN) 100 UNIT/ML SOPN Inject 64 Units into the skin daily.  12  . levothyroxine (SYNTHROID, LEVOTHROID) 125 MCG tablet Take 125 mcg by  mouth daily before breakfast.  5  . metoprolol succinate (TOPROL XL) 25 MG 24 hr tablet Take 1 tablet (25 mg total) by mouth at bedtime. 90 tablet 3  . nitroGLYCERIN (NITROSTAT) 0.4 MG SL tablet Place 1 tablet (0.4 mg total) under the tongue every 5 (five) minutes as needed for chest pain. 75 tablet 2  . potassium chloride SA (K-DUR,KLOR-CON) 20 MEQ tablet TAKE 2 TABLETS BY MOUTH ONCE DAILY 180 tablet 2   Current Facility-Administered Medications  Medication Dose Route Frequency Provider Last Rate Last Dose  . 0.9 %  sodium chloride infusion  500 mL Intravenous Once Irene Shipper, MD        Physical Exam: Vitals:   02/22/18 1059  Height: 5\' 7"  (1.702 m)    GEN- The patient is morbid obesity appearing, alert and oriented x 3 today.   Head- normocephalic, atraumatic Eyes-  Sclera clear, conjunctiva pink Ears- hearing intact Oropharynx- clear Lungs- Clear to ausculation bilaterally, normal work of breathing Chest- pacemaker pocket is well healed Heart- Regular rate and rhythm, no murmurs, rubs or gallops, PMI not laterally displaced GI- soft, NT, ND, + BS Extremities- no clubbing, cyanosis, +dependant edema  Pacemaker interrogation- reviewed in detail today,  See PACEART report  ekg tracing ordered today is personally reviewed and shows AV paced rhythm  Assessment and Plan:  1. Ectopic atrial tachycardia Much improved with flecainide increase to 75mg  BID 2 weeks ago  2. 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  3. HTN Stable No change required today  4. Obesity Body mass index is 43.38 kg/m. Wt Readings from Last 3 Encounters:  02/22/18 277 lb (125.6 kg)  02/08/18 273 lb (123.8 kg)  02/03/18 273 lb (123.8 kg)   Not making progress  Merlin Return to see EP NP in 3 months  [name] MD, [facility] 02/22/2018 11:05 AM

## 2018-03-06 ENCOUNTER — Other Ambulatory Visit: Payer: Self-pay | Admitting: Internal Medicine

## 2018-03-10 ENCOUNTER — Other Ambulatory Visit: Payer: Self-pay | Admitting: Internal Medicine

## 2018-03-10 MED ORDER — ATORVASTATIN CALCIUM 80 MG PO TABS: 80.0000 mg | ORAL_TABLET | Freq: Every day | ORAL | 0 refills | Status: DC

## 2018-03-20 ENCOUNTER — Encounter: Payer: Self-pay | Admitting: Nurse Practitioner

## 2018-03-20 ENCOUNTER — Ambulatory Visit (INDEPENDENT_AMBULATORY_CARE_PROVIDER_SITE_OTHER): Payer: Medicare Other | Admitting: *Deleted

## 2018-03-20 DIAGNOSIS — I442 Atrioventricular block, complete: Secondary | ICD-10-CM | POA: Diagnosis not present

## 2018-03-21 ENCOUNTER — Encounter: Payer: Self-pay | Admitting: Cardiology

## 2018-03-21 ENCOUNTER — Encounter: Payer: Self-pay | Admitting: Internal Medicine

## 2018-03-21 ENCOUNTER — Other Ambulatory Visit (INDEPENDENT_AMBULATORY_CARE_PROVIDER_SITE_OTHER): Payer: Medicare Other

## 2018-03-21 ENCOUNTER — Ambulatory Visit (INDEPENDENT_AMBULATORY_CARE_PROVIDER_SITE_OTHER): Payer: Medicare Other | Admitting: Internal Medicine

## 2018-03-21 VITALS — BP 122/80 | HR 85 | Temp 97.8°F | Ht 67.0 in | Wt 274.0 lb

## 2018-03-21 DIAGNOSIS — E039 Hypothyroidism, unspecified: Secondary | ICD-10-CM

## 2018-03-21 DIAGNOSIS — M159 Polyosteoarthritis, unspecified: Secondary | ICD-10-CM

## 2018-03-21 DIAGNOSIS — I25118 Atherosclerotic heart disease of native coronary artery with other forms of angina pectoris: Secondary | ICD-10-CM

## 2018-03-21 DIAGNOSIS — M150 Primary generalized (osteo)arthritis: Secondary | ICD-10-CM

## 2018-03-21 DIAGNOSIS — R21 Rash and other nonspecific skin eruption: Secondary | ICD-10-CM

## 2018-03-21 DIAGNOSIS — M8949 Other hypertrophic osteoarthropathy, multiple sites: Secondary | ICD-10-CM

## 2018-03-21 LAB — T4, FREE: FREE T4: 0.96 ng/dL (ref 0.60–1.60)

## 2018-03-21 LAB — TSH: TSH: 2.22 u[IU]/mL (ref 0.35–4.50)

## 2018-03-21 MED ORDER — HYDROCODONE-ACETAMINOPHEN 5-325 MG PO TABS: 1.0000 | ORAL_TABLET | ORAL | 0 refills | Status: DC | PRN

## 2018-03-21 NOTE — Progress Notes (Addendum)
   Subjective:    Patient ID: Mary [name], female    DOB: 1941/08/02, 77 y.o.   MRN: [id number]  HPI The patient is a 77 YO female coming in for recurrent welts. She is getting red bumps on her skin which turn into patches of raised red rash. These come without precipitant. No scarring on the skin. They can be anywhere and seem to not come the same place twice. Last one was large and severe on her neck left side. Denies fevers or chills. No change in medications. Previously tested for autoimmune disease which was negative. No current flare.   [place] narcotic database reviewed and no inappropriate fills.   Review of Systems  Constitutional: Negative.   HENT: Negative.   Eyes: Negative.   Respiratory: Negative for cough, chest tightness and shortness of breath.   Cardiovascular: Negative for chest pain, palpitations and leg swelling.  Gastrointestinal: Negative for abdominal distention, abdominal pain, constipation, diarrhea, nausea and vomiting.  Musculoskeletal: Negative.   Skin: Negative.   Neurological: Negative.   Psychiatric/Behavioral: Negative.       Objective:   Physical Exam  Constitutional: She is oriented to person, place, and time. She appears well-developed and well-nourished.  HENT:  Head: Normocephalic and atraumatic.  Eyes: EOM are normal.  Neck: Normal range of motion.  Cardiovascular: Normal rate and regular rhythm.  Pulmonary/Chest: Effort normal and breath sounds normal. No respiratory distress. She has no wheezes. She has no rales.  Abdominal: Soft. Bowel sounds are normal. She exhibits no distension. There is no tenderness. There is no rebound.  Musculoskeletal: She exhibits no edema.  Neurological: She is alert and oriented to person, place, and time. Coordination normal.  Skin: Skin is warm and dry.  Psychiatric: She has a normal mood and affect.   Vitals:   03/21/18 1528  BP: 122/80  Pulse: 85  Temp: 97.8 F (36.6 C)  TempSrc: Oral  SpO2: 94%  Weight:  274 lb (124.3 kg)  Height: 5\' 7"  (1.702 m)      Assessment & Plan:

## 2018-03-21 NOTE — Patient Instructions (Signed)
We will check the labs today and get the allergist to test you.

## 2018-03-21 NOTE — Assessment & Plan Note (Signed)
Testing for autoimmune disease was negative. Checking thyroid levels today. Refer to allergy specialist for testing to any possible environmental trigger.

## 2018-03-21 NOTE — Assessment & Plan Note (Signed)
Rx for hydrocodone for flare, 5 day supply.  narcotic database reviewed and updated. No inappropriate fills or red flags.

## 2018-03-21 NOTE — Progress Notes (Signed)
Remote pacemaker transmission.   

## 2018-03-21 NOTE — Assessment & Plan Note (Signed)
Checking TSH and free T4.

## 2018-03-23 ENCOUNTER — Encounter: Payer: Self-pay | Admitting: Nurse Practitioner

## 2018-03-23 ENCOUNTER — Encounter: Payer: Self-pay | Admitting: Internal Medicine

## 2018-04-16 LAB — CUP PACEART REMOTE DEVICE CHECK
Battery Remaining Percentage: 95.5 %
Battery Voltage: 2.96 V
Brady Statistic AP VP Percent: 99 %
Brady Statistic AP VS Percent: 1 %
Brady Statistic RV Percent Paced: 99 %
Date Time Interrogation Session: 20190722110916
Implantable Lead Implant Date: 20150506
Implantable Lead Implant Date: 20150506
Implantable Lead Location: 753859
Implantable Lead Location: 753860
Implantable Pulse Generator Implant Date: 20150506
Lead Channel Impedance Value: 390 Ohm
Lead Channel Pacing Threshold Amplitude: 0.75 V
Lead Channel Sensing Intrinsic Amplitude: 0.5 mV
Lead Channel Setting Pacing Amplitude: 2.5 V
Lead Channel Setting Pacing Pulse Width: 0.4 ms
Lead Channel Setting Sensing Sensitivity: 4 mV
MDC IDC MSMT BATTERY REMAINING LONGEVITY: 83 mo
MDC IDC MSMT LEADCHNL RA IMPEDANCE VALUE: 340 Ohm
MDC IDC MSMT LEADCHNL RA PACING THRESHOLD AMPLITUDE: 1.25 V
MDC IDC MSMT LEADCHNL RA PACING THRESHOLD PULSEWIDTH: 0.4 ms
MDC IDC MSMT LEADCHNL RV PACING THRESHOLD PULSEWIDTH: 0.4 ms
MDC IDC MSMT LEADCHNL RV SENSING INTR AMPL: 12 mV
MDC IDC SET LEADCHNL RV PACING AMPLITUDE: 2.5 V
MDC IDC STAT BRADY AS VP PERCENT: 1 %
MDC IDC STAT BRADY AS VS PERCENT: 0 %
MDC IDC STAT BRADY RA PERCENT PACED: 99 %
Pulse Gen Model: 2240
Pulse Gen Serial Number: 3013730

## 2018-04-18 ENCOUNTER — Other Ambulatory Visit: Payer: Self-pay | Admitting: Internal Medicine

## 2018-04-26 ENCOUNTER — Ambulatory Visit: Payer: Self-pay | Admitting: Allergy and Immunology

## 2018-05-08 NOTE — Progress Notes (Signed)
Electrophysiology Office Note Date: 05/10/2018  ID:  Mary [name], DOB 18-Mar-1941, MRN [id number]  PCP: Hoyt Koch, MD Primary Cardiologist: [name] Electrophysiologist: Allred  CC: Pacemaker follow-up  Mary [name] is a 77 y.o. female seen today for Dr Rayann Heman.  She presents today for routine electrophysiology followup.  Since last being seen in our clinic, the patient reports doing relatively well.  She has been having problems with orthostasis and dizziness.  Her SBP is in the 80's when she feels this way.  She has not had any BP readings at home with SBP >110.  She denies chest pain, dyspnea, PND, orthopnea, nausea, vomiting, syncope, edema, weight gain, or early satiety.  Device History: STJ dual chamber PPM implanted 2015 for Mobitz II   Past Medical History:  Diagnosis Date  . Arthritis   . Atrial tachycardia ([place]) 04/23/2017  . CAD (coronary artery disease)    non obstructive  . 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 Procedure complicated by an atrial lead dislodgment and repeat procedure, 12/2013   . Carotid stenosis    Carotid US 5/16:  Bilateral ICA 1-39%; L vertebral retrograde; L BP 126/49, R BP 140/57  . CHF (congestive heart failure) ([place])   . Chronic lower back pain   . Colon cancer ([place])    a. s/p chemo  . Colon polyps   . Diabetic peripheral neuropathy ([place])   . DVT (deep venous thrombosis) ([place]) 2013   "twice behind knee on left side" (06/25/2013)  . Glaucoma   . Hyperlipidemia   . Hypertension   . Hypothyroidism   . Iron deficiency anemia   . Multinodular goiter   . Osteoporosis   . Sleep apnea    a. resolved post weight loss   . Type II diabetes mellitus ([place])    Past Surgical History:  Procedure Laterality Date  . CARDIAC CATHETERIZATION  2006   Never had PCI, 3 caths total. Last one in [place]  . CARDIAC CATHETERIZATION N/A 02/04/2015   Procedure: Left Heart Cath and  Coronary Angiography;  Surgeon: Belva Crome, MD;  Location: [facility];  Service: Cardiovascular;  Laterality: N/A;  . CATARACT EXTRACTION, BILATERAL Bilateral 2013   "and put stent in my left eye for glaucoma" (06/25/2013)  . CHOLECYSTECTOMY  1980's  . COLECTOMY  10/2010   Tumor removal  . EYE MUSCLE SURGERY Bilateral ~ 1963   "muscles too long; eyes would go out and up; tied muscles to hold my eyes straight" (06/25/2013)  . LEAD REVISION  01-03-2014   atrial lead revision by Dr Rayann Heman  . LEAD REVISION N/A 01/03/2014   Procedure: LEAD REVISION;  Surgeon: Coralyn Mark, MD;  Location: [facility];  Service: Cardiovascular;  Laterality: N/A;  . PACEMAKER INSERTION  01/02/2014   STJ Assurity dual chamber pacemaker implnated by Dr Lovena Le for SSS  . PERMANENT PACEMAKER INSERTION N/A 01/02/2014   Procedure: PERMANENT PACEMAKER INSERTION;  Surgeon: Evans Lance, MD;  Location: [facility] CATH LAB;  Service: Cardiovascular;  Laterality: N/A;  . PORTACATH PLACEMENT Left 11/2010  . ROUX-EN-Y GASTRIC BYPASS  2009  . ULNAR TUNNEL RELEASE Left ~ 2008  . UMBILICAL HERNIA REPAIR  1970's?    (06/25/2013)    Current Outpatient Medications  Medication Sig Dispense Refill  . atorvastatin (LIPITOR) 80 MG tablet TAKE 1 TABLET(80 MG) BY MOUTH DAILY 90 tablet 0  . bimatoprost (LUMIGAN) 0.01 % SOLN  Place 1 drop into the right eye at bedtime.     Marland Kitchen buPROPion (WELLBUTRIN XL) 150 MG 24 hr tablet Take 1 tablet (150 mg total) by mouth daily. Need annual visit for further refills 30 tablet 0  . ELIQUIS 5 MG TABS tablet TAKE 1 TABLET BY MOUTH TWICE A DAY 180 tablet 3  . flecainide (TAMBOCOR) 50 MG tablet Take 1.5 tablets (75 mg total) by mouth 2 (two) times daily. 90 tablet 10  . furosemide (LASIX) 80 MG tablet Take 1 tablet (80 mg total) by mouth daily. 30 tablet 2  . gabapentin (NEURONTIN) 300 MG capsule Take 300 mg by mouth at bedtime.     Marland Kitchen HYDROcodone-acetaminophen (NORCO/VICODIN) 5-325 MG tablet Take 1 tablet  by mouth every 4 (four) hours as needed for moderate pain. 30 tablet 0  . Insulin Glargine (BASAGLAR KWIKPEN) 100 UNIT/ML SOPN Inject 64 Units into the skin daily.  12  . levothyroxine (SYNTHROID, LEVOTHROID) 125 MCG tablet Take 125 mcg by mouth daily before breakfast.  5  . metoprolol succinate (TOPROL XL) 25 MG 24 hr tablet Take 1 tablet (25 mg total) by mouth at bedtime. 90 tablet 3  . nitroGLYCERIN (NITROSTAT) 0.4 MG SL tablet Place 1 tablet (0.4 mg total) under the tongue every 5 (five) minutes as needed for chest pain. 75 tablet 2  . potassium chloride SA (K-DUR,KLOR-CON) 20 MEQ tablet TAKE 2 TABLETS BY MOUTH ONCE DAILY 180 tablet 2   No current facility-administered medications for this visit.     Allergies:   Other; Adhesive [tape]; Aspirin; Latex; and Nsaids   Social History: Social History   Socioeconomic History  . Marital status: Married    Spouse name: [name]  . Number of children: 0  . Years of education: Not on file  . Highest education level: Not on file  Occupational History  . Occupation: Retired    Comment: office work  Scientific laboratory technician  . Financial resource strain: Not very hard  . Food insecurity:    Worry: Never true    Inability: Never true  . Transportation needs:    Medical: No    Non-medical: No  Tobacco Use  . Smoking status: Former Smoker    Packs/day: 1.00    Years: 30.00    Pack years: 30.00    Types: Cigarettes    Last attempt to quit: 02/2017    Years since quitting: 1.1  . Smokeless tobacco: Never Used  . Tobacco comment: 06/25/2013 "smoked off and on since I was 22; stopped 10/2007 til 08/30/2012 then started again cause of stress"  Substance and Sexual Activity  . Alcohol use: Not Currently    Alcohol/week: 0.0 standard drinks  . Drug use: No  . Sexual activity: Yes  Lifestyle  . Physical activity:    Days per week: 0 days    Minutes per session: 0 min  . Stress: Only a little  Relationships  . Social connections:    Talks on phone:  More than three times a week    Gets together: More than three times a week    Attends religious service: More than 4 times per year    Active member of club or organization: Yes    Attends meetings of clubs or organizations: More than 4 times per year    Relationship status: Married  . Intimate partner violence:    Fear of current or ex partner: No    Emotionally abused: No    Physically abused: No  Forced sexual activity: No  Other Topics Concern  . Not on file  Social History Narrative   Married to husband, [name]   No children   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   Recently moved back here from [place]     Family History: Family History  Problem Relation Age of Onset  . Breast cancer Mother   . Diabetes Mother   . Heart disease Father   . Heart attack Father   . Heart attack Sister   . Heart attack Brother   . Bladder Cancer Brother   . Prostate cancer Brother   . Colon polyps Brother   . Breast cancer Sister   . Breast cancer Maternal Aunt        x 2 aunts  . Pancreatic cancer Brother   . Colon polyps Brother   . Bladder Cancer Cousin   . Clotting disorder Sister   . Diabetes Sister        x 3  . Diabetes Maternal Grandmother      Review of Systems: All other systems reviewed and are otherwise negative except as noted above.   Physical Exam: VS:  BP 140/62   Pulse 90   Ht 5\' 7"  (1.702 m)   Wt 267 lb 6.4 oz (121.3 kg)   SpO2 98%   BMI 41.88 kg/m  , BMI Body mass index is 41.88 kg/m.  GEN- The patient is obese appearing, alert and oriented x 3 today.   HEENT: normocephalic, atraumatic; sclera clear, conjunctiva pink; hearing intact; oropharynx clear; neck supple  Lungs- Clear to ausculation bilaterally, normal work of breathing.  No wheezes, rales, rhonchi Heart- Regular rate and rhythm (paced) GI- soft, non-tender, non-distended, bowel sounds present  Extremities- no clubbing, cyanosis, or edema  MS- no significant  deformity or atrophy Skin- warm and dry, no rash or lesion; PPM pocket well healed Psych- euthymic mood, full affect Neuro- strength and sensation are intact  PPM Interrogation- reviewed in detail today,  See PACEART report  Recent Labs: 11/16/2017: Hemoglobin 11.6; Platelets 358.0 03/21/2018: TSH 2.22   Wt Readings from Last 3 Encounters:  05/10/18 267 lb 6.4 oz (121.3 kg)  03/21/18 274 lb (124.3 kg)  02/22/18 277 lb (125.6 kg)     Other studies Reviewed: Additional studies/ records that were reviewed today include: Dr Jackalyn Lombard office notes  Assessment and Plan:  1.  Complete heart block Normal PPM function See Pace Art report No changes today  2.  Atrial tachycardia Doing well on Flecainide No significant recurrence  3.  HTN Stable No change required today  4.  Obesity Body mass index is 41.88 kg/m. Weight loss encouraged   5.  Orthostasis/hypotension Will decrease Metoprolol to 12.5mg  daily BMET, CBC today    Current medicines are reviewed at length with the patient today.   The patient does not have concerns regarding her medicines.  The following changes were made today:  Decrease Metoprolol to 12.5mg  daily   Labs/ tests ordered today include: none Orders Placed This Encounter  Procedures  . CUP PACEART INCLINIC DEVICE CHECK     Disposition:   Follow up with Merlin, me in 6 months    Signed, Chanetta Marshall, NP 05/10/2018 12:11 PM  [place] [address] [phone] (office) [phone] (fax)

## 2018-05-10 ENCOUNTER — Encounter: Payer: Self-pay | Admitting: Nurse Practitioner

## 2018-05-10 ENCOUNTER — Ambulatory Visit (INDEPENDENT_AMBULATORY_CARE_PROVIDER_SITE_OTHER): Payer: Medicare Other | Admitting: Nurse Practitioner

## 2018-05-10 VITALS — BP 140/62 | HR 90 | Ht 67.0 in | Wt 267.4 lb

## 2018-05-10 DIAGNOSIS — I442 Atrioventricular block, complete: Secondary | ICD-10-CM | POA: Diagnosis not present

## 2018-05-10 DIAGNOSIS — I25118 Atherosclerotic heart disease of native coronary artery with other forms of angina pectoris: Secondary | ICD-10-CM

## 2018-05-10 DIAGNOSIS — I10 Essential (primary) hypertension: Secondary | ICD-10-CM

## 2018-05-10 DIAGNOSIS — I471 Supraventricular tachycardia: Secondary | ICD-10-CM | POA: Diagnosis not present

## 2018-05-10 LAB — BASIC METABOLIC PANEL
BUN / CREAT RATIO: 18 (ref 12–28)
BUN: 20 mg/dL (ref 8–27)
CHLORIDE: 92 mmol/L — AB (ref 96–106)
CO2: 27 mmol/L (ref 20–29)
CREATININE: 1.13 mg/dL — AB (ref 0.57–1.00)
Calcium: 9.4 mg/dL (ref 8.7–10.3)
GFR calc non Af Amer: 47 mL/min/{1.73_m2} — ABNORMAL LOW (ref >59)
GFR, EST AFRICAN AMERICAN: 55 mL/min/{1.73_m2} — AB (ref >59)
Glucose: 169 mg/dL — ABNORMAL HIGH (ref 65–99)
Potassium: 4.3 mmol/L (ref 3.5–5.2)
SODIUM: 136 mmol/L (ref 134–144)

## 2018-05-10 LAB — CBC
HEMATOCRIT: 31 % — AB (ref 34.0–46.6)
Hemoglobin: 9.8 g/dL — ABNORMAL LOW (ref 11.1–15.9)
MCH: 24.6 pg — ABNORMAL LOW (ref 26.6–33.0)
MCHC: 31.6 g/dL (ref 31.5–35.7)
MCV: 78 fL — AB (ref 79–97)
Platelets: 441 10*3/uL (ref 150–450)
RBC: 3.99 x10E6/uL (ref 3.77–5.28)
RDW: 15.6 % — AB (ref 12.3–15.4)
WBC: 9.9 10*3/uL (ref 3.4–10.8)

## 2018-05-10 LAB — CUP PACEART INCLINIC DEVICE CHECK
Implantable Lead Implant Date: 20150506
Implantable Lead Location: 753859
Implantable Lead Location: 753860
Implantable Pulse Generator Implant Date: 20150506
MDC IDC LEAD IMPLANT DT: 20150506
MDC IDC SESS DTM: 20190911114510
Pulse Gen Model: 2240
Pulse Gen Serial Number: 3013730

## 2018-05-10 MED ORDER — FLECAINIDE ACETATE 50 MG PO TABS: 75.0000 mg | ORAL_TABLET | Freq: Two times a day (BID) | ORAL | 4 refills | Status: DC

## 2018-05-10 MED ORDER — METOPROLOL SUCCINATE ER 25 MG PO TB24: 12.5000 mg | ORAL_TABLET | Freq: Every day | ORAL | 3 refills | Status: DC

## 2018-05-10 NOTE — Patient Instructions (Addendum)
Medication Instructions:   START TASKING TOPROL 12.5 MG ONCE A DAY   If you need a refill on your cardiac medications before your next appointment, please call your pharmacy.  Labwork:  BMET AND CBC TODAY    Testing/Procedures: NONE ORDERED  TODAY    Follow-Up:  Your physician wants you to follow-up in:  [place] will receive a reminder letter in the mail two months in advance. If you don't receive a letter, please call our office to schedule the follow-up appointment.    required to check your device to one time per year. It allows [place] to keep an eye on the functioning of your device to ensure it is working properly. You are scheduled for a device check from home on .  06-19-18  You may send your transmission at any time that day. If you have a wireless device, the transmission will be sent automatically. After your physician reviews your transmission, you will receive a postcard with your next transmission date.     Any Other Special Instructions Will Be Listed Below (If Applicable).

## 2018-05-12 ENCOUNTER — Other Ambulatory Visit: Payer: Self-pay | Admitting: Internal Medicine

## 2018-05-12 ENCOUNTER — Encounter: Payer: Self-pay | Admitting: Internal Medicine

## 2018-05-12 ENCOUNTER — Telehealth: Payer: Self-pay | Admitting: Nurse Practitioner

## 2018-05-12 LAB — CUP PACEART INCLINIC DEVICE CHECK
Battery Remaining Longevity: 92 mo
Battery Voltage: 2.98 V
Brady Statistic RA Percent Paced: 41 %
Brady Statistic RV Percent Paced: 99.99 %
Date Time Interrogation Session: 20190626150606
Implantable Lead Implant Date: 20150506
Implantable Lead Location: 753860
Lead Channel Setting Sensing Sensitivity: 4 mV
MDC IDC LEAD IMPLANT DT: 20150506
MDC IDC LEAD LOCATION: 753859
MDC IDC MSMT LEADCHNL RA IMPEDANCE VALUE: 362.5 Ohm
MDC IDC MSMT LEADCHNL RV IMPEDANCE VALUE: 387.5 Ohm
MDC IDC PG IMPLANT DT: 20150506
MDC IDC PG SERIAL: 3013730
MDC IDC SET LEADCHNL RA PACING AMPLITUDE: 2.5 V
MDC IDC SET LEADCHNL RV PACING AMPLITUDE: 2.5 V
MDC IDC SET LEADCHNL RV PACING PULSEWIDTH: 0.4 ms
Pulse Gen Model: 2240

## 2018-05-12 MED ORDER — BUPROPION HCL ER (XL) 150 MG PO TB24: 150.0000 mg | ORAL_TABLET | Freq: Every day | ORAL | 1 refills | Status: DC

## 2018-05-12 NOTE — Telephone Encounter (Signed)
Please call with lab results 

## 2018-05-12 NOTE — Telephone Encounter (Signed)
Spoke with patient about her BMET and CBC. She expressed understanding and advised the patient to follow up with her PCP about her Hemoglobin level.

## 2018-05-18 ENCOUNTER — Ambulatory Visit: Payer: Self-pay | Admitting: Allergy and Immunology

## 2018-05-21 ENCOUNTER — Encounter (HOSPITAL_COMMUNITY): Payer: Self-pay | Admitting: Nurse Practitioner

## 2018-05-21 ENCOUNTER — Inpatient Hospital Stay (HOSPITAL_COMMUNITY)
Admission: EM | Admit: 2018-05-21 | Discharge: 2018-05-22 | DRG: 291 | Discharge status: Against Medical Advice | Payer: Medicare Other | Attending: Family Medicine | Admitting: Family Medicine

## 2018-05-21 ENCOUNTER — Emergency Department (HOSPITAL_COMMUNITY): Payer: Medicare Other

## 2018-05-21 DIAGNOSIS — E1142 Type 2 diabetes mellitus with diabetic polyneuropathy: Secondary | ICD-10-CM | POA: Diagnosis present

## 2018-05-21 DIAGNOSIS — H409 Unspecified glaucoma: Secondary | ICD-10-CM | POA: Diagnosis present

## 2018-05-21 DIAGNOSIS — R110 Nausea: Secondary | ICD-10-CM | POA: Diagnosis not present

## 2018-05-21 DIAGNOSIS — I5033 Acute on chronic diastolic (congestive) heart failure: Secondary | ICD-10-CM | POA: Diagnosis not present

## 2018-05-21 DIAGNOSIS — Z7901 Long term (current) use of anticoagulants: Secondary | ICD-10-CM

## 2018-05-21 DIAGNOSIS — Z8249 Family history of ischemic heart disease and other diseases of the circulatory system: Secondary | ICD-10-CM

## 2018-05-21 DIAGNOSIS — J441 Chronic obstructive pulmonary disease with (acute) exacerbation: Secondary | ICD-10-CM | POA: Diagnosis not present

## 2018-05-21 DIAGNOSIS — Z884 Allergy status to anesthetic agent status: Secondary | ICD-10-CM

## 2018-05-21 DIAGNOSIS — Z86718 Personal history of other venous thrombosis and embolism: Secondary | ICD-10-CM

## 2018-05-21 DIAGNOSIS — E118 Type 2 diabetes mellitus with unspecified complications: Secondary | ICD-10-CM | POA: Diagnosis not present

## 2018-05-21 DIAGNOSIS — E039 Hypothyroidism, unspecified: Secondary | ICD-10-CM | POA: Diagnosis present

## 2018-05-21 DIAGNOSIS — J440 Chronic obstructive pulmonary disease with acute lower respiratory infection: Secondary | ICD-10-CM | POA: Diagnosis present

## 2018-05-21 DIAGNOSIS — Z5321 Procedure and treatment not carried out due to patient leaving prior to being seen by health care provider: Secondary | ICD-10-CM | POA: Diagnosis not present

## 2018-05-21 DIAGNOSIS — M1990 Unspecified osteoarthritis, unspecified site: Secondary | ICD-10-CM | POA: Diagnosis present

## 2018-05-21 DIAGNOSIS — M810 Age-related osteoporosis without current pathological fracture: Secondary | ICD-10-CM | POA: Diagnosis present

## 2018-05-21 DIAGNOSIS — G4730 Sleep apnea, unspecified: Secondary | ICD-10-CM | POA: Diagnosis present

## 2018-05-21 DIAGNOSIS — R0902 Hypoxemia: Secondary | ICD-10-CM

## 2018-05-21 DIAGNOSIS — I480 Paroxysmal atrial fibrillation: Secondary | ICD-10-CM | POA: Diagnosis present

## 2018-05-21 DIAGNOSIS — R0602 Shortness of breath: Secondary | ICD-10-CM

## 2018-05-21 DIAGNOSIS — Z91048 Other nonmedicinal substance allergy status: Secondary | ICD-10-CM

## 2018-05-21 DIAGNOSIS — I5043 Acute on chronic combined systolic (congestive) and diastolic (congestive) heart failure: Secondary | ICD-10-CM | POA: Diagnosis present

## 2018-05-21 DIAGNOSIS — Z950 Presence of cardiac pacemaker: Secondary | ICD-10-CM | POA: Diagnosis not present

## 2018-05-21 DIAGNOSIS — I110 Hypertensive heart disease with heart failure: Principal | ICD-10-CM | POA: Diagnosis present

## 2018-05-21 DIAGNOSIS — E785 Hyperlipidemia, unspecified: Secondary | ICD-10-CM | POA: Diagnosis present

## 2018-05-21 DIAGNOSIS — Z888 Allergy status to other drugs, medicaments and biological substances status: Secondary | ICD-10-CM

## 2018-05-21 DIAGNOSIS — Z85038 Personal history of other malignant neoplasm of large intestine: Secondary | ICD-10-CM

## 2018-05-21 DIAGNOSIS — I10 Essential (primary) hypertension: Secondary | ICD-10-CM | POA: Diagnosis not present

## 2018-05-21 DIAGNOSIS — Z91040 Latex allergy status: Secondary | ICD-10-CM

## 2018-05-21 DIAGNOSIS — Z87891 Personal history of nicotine dependence: Secondary | ICD-10-CM

## 2018-05-21 DIAGNOSIS — Z794 Long term (current) use of insulin: Secondary | ICD-10-CM

## 2018-05-21 DIAGNOSIS — Z79899 Other long term (current) drug therapy: Secondary | ICD-10-CM

## 2018-05-21 DIAGNOSIS — J9601 Acute respiratory failure with hypoxia: Secondary | ICD-10-CM | POA: Diagnosis present

## 2018-05-21 DIAGNOSIS — R0600 Dyspnea, unspecified: Secondary | ICD-10-CM | POA: Diagnosis not present

## 2018-05-21 DIAGNOSIS — J209 Acute bronchitis, unspecified: Secondary | ICD-10-CM | POA: Diagnosis present

## 2018-05-21 DIAGNOSIS — R079 Chest pain, unspecified: Secondary | ICD-10-CM | POA: Diagnosis not present

## 2018-05-21 DIAGNOSIS — I2510 Atherosclerotic heart disease of native coronary artery without angina pectoris: Secondary | ICD-10-CM | POA: Diagnosis present

## 2018-05-21 DIAGNOSIS — Z9884 Bariatric surgery status: Secondary | ICD-10-CM

## 2018-05-21 DIAGNOSIS — Z9221 Personal history of antineoplastic chemotherapy: Secondary | ICD-10-CM

## 2018-05-21 LAB — BASIC METABOLIC PANEL
Anion gap: 14 (ref 5–15)
BUN: 19 mg/dL (ref 8–23)
CHLORIDE: 87 mmol/L — AB (ref 98–111)
CO2: 31 mmol/L (ref 22–32)
Calcium: 9.2 mg/dL (ref 8.9–10.3)
Creatinine, Ser: 1.08 mg/dL — ABNORMAL HIGH (ref 0.44–1.00)
GFR calc non Af Amer: 49 mL/min — ABNORMAL LOW (ref >60)
GFR, EST AFRICAN AMERICAN: 56 mL/min — AB (ref >60)
Glucose, Bld: 101 mg/dL — ABNORMAL HIGH (ref 70–99)
POTASSIUM: 3.3 mmol/L — AB (ref 3.5–5.1)
SODIUM: 132 mmol/L — AB (ref 135–145)

## 2018-05-21 LAB — CBC WITH DIFFERENTIAL/PLATELET
BASOS PCT: 0 %
Basophils Absolute: 0 10*3/uL (ref 0.0–0.1)
EOS ABS: 0.1 10*3/uL (ref 0.0–0.7)
Eosinophils Relative: 1 %
HEMATOCRIT: 31.8 % — AB (ref 36.0–46.0)
HEMOGLOBIN: 9.8 g/dL — AB (ref 12.0–15.0)
LYMPHS ABS: 2.1 10*3/uL (ref 0.7–4.0)
Lymphocytes Relative: 21 %
MCH: 24.7 pg — ABNORMAL LOW (ref 26.0–34.0)
MCHC: 30.8 g/dL (ref 30.0–36.0)
MCV: 80.3 fL (ref 78.0–100.0)
Monocytes Absolute: 0.7 10*3/uL (ref 0.1–1.0)
Monocytes Relative: 8 %
NEUTROS ABS: 6.6 10*3/uL (ref 1.7–7.7)
NEUTROS PCT: 70 %
Platelets: 445 10*3/uL — ABNORMAL HIGH (ref 150–400)
RBC: 3.96 MIL/uL (ref 3.87–5.11)
RDW: 15.5 % (ref 11.5–15.5)
WBC: 9.6 10*3/uL (ref 4.0–10.5)

## 2018-05-21 LAB — I-STAT TROPONIN, ED
TROPONIN I, POC: 0 ng/mL (ref 0.00–0.08)
TROPONIN I, POC: 0 ng/mL (ref 0.00–0.08)

## 2018-05-21 LAB — D-DIMER, QUANTITATIVE (NOT AT ARMC): D DIMER QUANT: 0.41 ug{FEU}/mL (ref 0.00–0.50)

## 2018-05-21 LAB — BRAIN NATRIURETIC PEPTIDE: B NATRIURETIC PEPTIDE 5: 257.3 pg/mL — AB (ref 0.0–100.0)

## 2018-05-21 MED ORDER — FUROSEMIDE 10 MG/ML IJ SOLN: 40.0000 mg | Freq: Two times a day (BID) | INTRAMUSCULAR | Status: DC

## 2018-05-21 MED ORDER — INSULIN ASPART 100 UNIT/ML ~~LOC~~ SOLN
0.0000 [IU] | Freq: Three times a day (TID) | SUBCUTANEOUS | Status: DC
  Administered 2018-05-22: 15 [IU] via SUBCUTANEOUS
  Administered 2018-05-22: 8 [IU] via SUBCUTANEOUS

## 2018-05-21 MED ORDER — APIXABAN 5 MG PO TABS
5.0000 mg | ORAL_TABLET | Freq: Two times a day (BID) | ORAL | Status: DC
  Administered 2018-05-22 (×2): 5 mg via ORAL
  Filled 2018-05-21 (×2): qty 1

## 2018-05-21 MED ORDER — FUROSEMIDE 10 MG/ML IJ SOLN
80.0000 mg | Freq: Every day | INTRAMUSCULAR | Status: DC
  Administered 2018-05-22: 80 mg via INTRAVENOUS
  Filled 2018-05-21: qty 8

## 2018-05-21 MED ORDER — ALBUTEROL SULFATE (2.5 MG/3ML) 0.083% IN NEBU
5.0000 mg | INHALATION_SOLUTION | Freq: Once | RESPIRATORY_TRACT | Status: AC
  Administered 2018-05-21: 5 mg via RESPIRATORY_TRACT
  Filled 2018-05-21: qty 6

## 2018-05-21 MED ORDER — GABAPENTIN 300 MG PO CAPS
300.0000 mg | ORAL_CAPSULE | Freq: Every day | ORAL | Status: DC
  Administered 2018-05-22: 300 mg via ORAL
  Filled 2018-05-21: qty 1

## 2018-05-21 MED ORDER — FLECAINIDE ACETATE 50 MG PO TABS
75.0000 mg | ORAL_TABLET | Freq: Two times a day (BID) | ORAL | Status: DC
  Administered 2018-05-22 (×2): 75 mg via ORAL
  Filled 2018-05-21 (×2): qty 2

## 2018-05-21 MED ORDER — POTASSIUM CHLORIDE CRYS ER 20 MEQ PO TBCR
40.0000 meq | EXTENDED_RELEASE_TABLET | Freq: Once | ORAL | Status: AC
  Administered 2018-05-22: 40 meq via ORAL
  Filled 2018-05-21: qty 2

## 2018-05-21 MED ORDER — ONDANSETRON HCL 4 MG/2ML IJ SOLN: 4.0000 mg | Freq: Four times a day (QID) | INTRAMUSCULAR | Status: DC | PRN

## 2018-05-21 MED ORDER — FUROSEMIDE 10 MG/ML IJ SOLN
40.0000 mg | Freq: Once | INTRAMUSCULAR | Status: AC
  Administered 2018-05-21: 40 mg via INTRAVENOUS
  Filled 2018-05-21: qty 4

## 2018-05-21 MED ORDER — ONDANSETRON HCL 4 MG/2ML IJ SOLN
4.0000 mg | Freq: Once | INTRAMUSCULAR | Status: AC
  Administered 2018-05-21: 4 mg via INTRAVENOUS
  Filled 2018-05-21: qty 2

## 2018-05-21 MED ORDER — INSULIN GLARGINE 100 UNIT/ML ~~LOC~~ SOLN
41.0000 [IU] | Freq: Every day | SUBCUTANEOUS | Status: DC
  Administered 2018-05-22: 41 [IU] via SUBCUTANEOUS
  Filled 2018-05-21 (×2): qty 0.41

## 2018-05-21 MED ORDER — SODIUM CHLORIDE 0.9% FLUSH: 3.0000 mL | INTRAVENOUS | Status: DC | PRN

## 2018-05-21 MED ORDER — HYDROCODONE-ACETAMINOPHEN 5-325 MG PO TABS: 1.0000 | ORAL_TABLET | ORAL | Status: DC | PRN

## 2018-05-21 MED ORDER — ATORVASTATIN CALCIUM 40 MG PO TABS: 80.0000 mg | ORAL_TABLET | Freq: Every day | ORAL | Status: DC

## 2018-05-21 MED ORDER — LEVOTHYROXINE SODIUM 125 MCG PO TABS
125.0000 ug | ORAL_TABLET | Freq: Every day | ORAL | Status: DC
  Administered 2018-05-22: 125 ug via ORAL
  Filled 2018-05-21: qty 1

## 2018-05-21 MED ORDER — METOPROLOL SUCCINATE ER 25 MG PO TB24
12.5000 mg | ORAL_TABLET | Freq: Every day | ORAL | Status: DC
  Administered 2018-05-22: 12.5 mg via ORAL
  Filled 2018-05-21: qty 1

## 2018-05-21 MED ORDER — FUROSEMIDE 10 MG/ML IJ SOLN
40.0000 mg | Freq: Once | INTRAMUSCULAR | Status: AC
  Administered 2018-05-22: 40 mg via INTRAVENOUS
  Filled 2018-05-21: qty 4

## 2018-05-21 MED ORDER — SODIUM CHLORIDE 0.9 % IV SOLN: 250.0000 mL | INTRAVENOUS | Status: DC | PRN

## 2018-05-21 MED ORDER — ACETAMINOPHEN 325 MG PO TABS: 650.0000 mg | ORAL_TABLET | ORAL | Status: DC | PRN

## 2018-05-21 MED ORDER — BUPROPION HCL ER (XL) 150 MG PO TB24
150.0000 mg | ORAL_TABLET | Freq: Every day | ORAL | Status: DC
  Administered 2018-05-22: 150 mg via ORAL
  Filled 2018-05-21: qty 1

## 2018-05-21 MED ORDER — LATANOPROST 0.005 % OP SOLN
1.0000 [drp] | Freq: Every day | OPHTHALMIC | Status: DC
  Administered 2018-05-22: 1 [drp] via OPHTHALMIC
  Filled 2018-05-21: qty 2.5

## 2018-05-21 MED ORDER — SODIUM CHLORIDE 0.9% FLUSH
3.0000 mL | Freq: Two times a day (BID) | INTRAVENOUS | Status: DC
  Administered 2018-05-22 (×2): 3 mL via INTRAVENOUS

## 2018-05-21 MED ORDER — METHYLPREDNISOLONE SODIUM SUCC 125 MG IJ SOLR
125.0000 mg | Freq: Once | INTRAMUSCULAR | Status: AC
  Administered 2018-05-21: 125 mg via INTRAVENOUS
  Filled 2018-05-21: qty 2

## 2018-05-21 NOTE — ED Notes (Signed)
For the attempt of pulse oximetry ambulation, pt desats to upper 80%s on transfer from laying to a sitting position, although she denies "being in distress" she demonstrates some degree of difficulty for this activity. I have set up a bedside commode for toileting purposes for this reason.

## 2018-05-21 NOTE — H&P (Addendum)
History and Physical    Mary [name] PNT:[id number] DOB: [birth year] DOA: 05/21/2018  PCP: Hoyt Koch, MD  Patient coming from: Home  I have personally briefly reviewed patient's old medical records in [place]  Chief Complaint: SOB  HPI: Mary [name] is a 77 y.o. female with medical history significant of diastolic CHF, PPM for CHB, HTN, DM2, PAF on eliquis.  Patient presents to the ED with c/o productive cough, SOB, orthopnea having to sleep upright in couch, rapid wt gain, peripheral edema.  Symptoms onset 2 weeks ago, progressively worsened.  Episode of sharp retrosternal CP last night, 4/10, resolved with NTG.  No sinus congestion.  Husbands albuterol neb seemed to help some.  Orthopnea especially is quite noticeable and different from baseline patient states.  No sick contacts.   ED Course: Trop neg x2, BNP 200, CXR neg.  Given albuterol nebs, Lasix 40mg  IV x1 with minimal UOP thus far, solumedrol.  Some improvement in symptoms but patient desaturating with ambulation.   Review of Systems: As per HPI otherwise 10 point review of systems negative.   Past Medical History:  Diagnosis Date  . Arthritis   . Atrial tachycardia ([place]) 04/23/2017  . CAD (coronary artery disease)    non obstructive  . 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 Procedure complicated by an atrial lead dislodgment and repeat procedure, 12/2013   . Carotid stenosis    Carotid US 5/16:  Bilateral ICA 1-39%; L vertebral retrograde; L BP 126/49, R BP 140/57  . CHF (congestive heart failure) ([place])   . Chronic lower back pain   . Colon cancer ([place])    a. s/p chemo  . Colon polyps   . Diabetic peripheral neuropathy ([place])   . DVT (deep venous thrombosis) ([place]) 2013   "twice behind knee on left side" (06/25/2013)  . Glaucoma   . Hyperlipidemia   . Hypertension   . Hypothyroidism   . Iron deficiency anemia   . Multinodular  goiter   . Osteoporosis   . Sleep apnea    a. resolved post weight loss   . Type II diabetes mellitus ([place])     Past Surgical History:  Procedure Laterality Date  . CARDIAC CATHETERIZATION  2006   Never had PCI, 3 caths total. Last one in [place]  . CARDIAC CATHETERIZATION N/A 02/04/2015   Procedure: Left Heart Cath and Coronary Angiography;  Surgeon: Belva Crome, MD;  Location: [facility];  Service: Cardiovascular;  Laterality: N/A;  . CATARACT EXTRACTION, BILATERAL Bilateral 2013   "and put stent in my left eye for glaucoma" (06/25/2013)  . CHOLECYSTECTOMY  1980's  . COLECTOMY  10/2010   Tumor removal  . EYE MUSCLE SURGERY Bilateral ~ 1963   "muscles too long; eyes would go out and up; tied muscles to hold my eyes straight" (06/25/2013)  . LEAD REVISION  01-03-2014   atrial lead revision by Dr Rayann Heman  . LEAD REVISION N/A 01/03/2014   Procedure: LEAD REVISION;  Surgeon: Coralyn Mark, MD;  Location: [facility] LAB;  Service: Cardiovascular;  Laterality: N/A;  . PACEMAKER INSERTION  01/02/2014   STJ Assurity dual chamber pacemaker implnated by Dr Lovena Le for SSS  . PERMANENT PACEMAKER INSERTION N/A 01/02/2014   Procedure: PERMANENT PACEMAKER INSERTION;  Surgeon: Evans Lance, MD;  Location: [facility] C[facility] LAB;  Service: Cardiovascular;  Laterality: N/A;  . PORTACATH PLACEMENT Left 11/2010  . ROUX-EN-Y  GASTRIC BYPASS  2009  . ULNAR TUNNEL RELEASE Left ~ 2008  . UMBILICAL HERNIA REPAIR  1970's?    (06/25/2013)     reports that she quit smoking about 14 months ago. Her smoking use included cigarettes. She has a 30.00 pack-year smoking history. She has never used smokeless tobacco. She reports that she drank alcohol. She reports that she does not use drugs.  Allergies  Allergen Reactions  . Other Other (See Comments)    NO Blind Scopes with Naso Gastric tube.  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 Adhesive [Tape]     Tape - burns - pls use paper tape or elastic bandage  . Aspirin Other (See  Comments)    S/P gastric bypass surgery, states her MD told her to not take Aspirin.  . Latex     Tape= burns  . Nsaids Other (See Comments)    S/P gastric bypass-told not to take. GI bleeds with NSAIDS    Family History  Problem Relation Age of Onset  . Breast cancer Mother   . Diabetes Mother   . Heart disease Father   . Heart attack Father   . Heart attack Sister   . Heart attack Brother   . Bladder Cancer Brother   . Prostate cancer Brother   . Colon polyps Brother   . Breast cancer Sister   . Breast cancer Maternal Aunt        x 2 aunts  . Pancreatic cancer Brother   . Colon polyps Brother   . Bladder Cancer Cousin   . Clotting disorder Sister   . Diabetes Sister        x 3  . Diabetes Maternal Grandmother      Prior to Admission medications   Medication Sig Start Date End Date Taking? Authorizing Provider  atorvastatin (LIPITOR) 80 MG tablet TAKE 1 TABLET(80 MG) BY MOUTH DAILY 04/18/18  Yes Hoyt Koch, MD  bimatoprost (LUMIGAN) 0.01 % SOLN Place 1 drop into the right eye at bedtime.    Yes [provider]  buPROPion (WELLBUTRIN XL) 150 MG 24 hr tablet Take 1 tablet (150 mg total) by mouth daily. 05/12/18  Yes Hoyt Koch, MD  ELIQUIS 5 MG TABS tablet TAKE 1 TABLET BY MOUTH TWICE A DAY 04/20/17  Yes Allred, Jeneen Rinks, MD  flecainide (TAMBOCOR) 50 MG tablet Take 1.5 tablets (75 mg total) by mouth 2 (two) times daily. 05/10/18  Yes Seiler, Amber K, NP  furosemide (LASIX) 80 MG tablet Take 1 tablet (80 mg total) by mouth daily. 01/24/18  Yes Hoyt Koch, MD  gabapentin (NEURONTIN) 300 MG capsule Take 300 mg by mouth at bedtime.    Yes [provider]  HYDROcodone-acetaminophen (NORCO/VICODIN) 5-325 MG tablet Take 1 tablet by mouth every 4 (four) hours as needed for moderate pain. 03/21/18  Yes Hoyt Koch, MD  Insulin Glargine (BASAGLAR KWIKPEN) 100 UNIT/ML SOPN Inject 64 Units into the skin daily. 01/09/18  Yes [provider]  levothyroxine (SYNTHROID, LEVOTHROID) 125 MCG tablet Take 125 mcg by mouth daily before breakfast. 01/10/18  Yes [provider]  metoprolol succinate (TOPROL XL) 25 MG 24 hr tablet Take 0.5 tablets (12.5 mg total) by mouth at bedtime. 05/10/18  Yes Seiler, Amber K, NP  nitroGLYCERIN (NITROSTAT) 0.4 MG SL tablet Place 1 tablet (0.4 mg total) under the tongue every 5 (five) minutes as needed for chest pain. 08/26/17  Yes Belva Crome, MD  potassium chloride SA (  K-DUR,KLOR-CON) 20 MEQ tablet TAKE 2 TABLETS BY MOUTH ONCE DAILY 08/15/17  Yes Eileen Stanford, [place]    Physical Exam: Vitals:   05/21/18 1745 05/21/18 1817 05/21/18 1830 05/21/18 1900  BP:  137/66 (!) 153/67 (!) 145/54  Pulse: 78 66 71 75  Resp: 19 13 15  (!) 23  Temp:      TempSrc:      SpO2: 97% (!) 89% 100% (!) 86%    Constitutional: NAD, calm, comfortable Eyes: PERRL, lids and conjunctivae normal ENMT: Mucous membranes are moist. Posterior pharynx clear of any exudate or lesions.Normal dentition.  Neck: normal, supple, no masses, no thyromegaly Respiratory: B rhonchi, rales, and wheezing Cardiovascular: IRR, IRR Abdomen: no tenderness, no masses palpated. No hepatosplenomegaly. Bowel sounds positive.  Musculoskeletal: no clubbing / cyanosis. No joint deformity upper and lower extremities. Good ROM, no contractures. Normal muscle tone.  Skin: no rashes, lesions, ulcers. No induration Neurologic: CN 2-12 grossly intact. Sensation intact, DTR normal. Strength 5/5 in all 4.  Psychiatric: Normal judgment and insight. Alert and oriented x 3. Normal mood.    Labs on Admission: I have personally reviewed following labs and imaging studies  CBC: Recent Labs  Lab 05/21/18 1813  WBC 9.6  NEUTROABS 6.6  HGB 9.8*  HCT 31.8*  MCV 80.3  PLT 081*   Basic Metabolic Panel: Recent Labs  Lab 05/21/18 1813  NA 132*  K 3.3*  CL 87*  CO2 31  GLUCOSE 101*  BUN 19  CREATININE 1.08*  CALCIUM 9.2    GFR: Estimated Creatinine Clearance: 59.8 mL/min (A) (by C-G formula based on SCr of 1.08 mg/dL (H)). Liver Function Tests: No results for input(s): AST, ALT, ALKPHOS, BILITOT, PROT, ALBUMIN in the last 168 hours. No results for input(s): LIPASE, AMYLASE in the last 168 hours. No results for input(s): AMMONIA in the last 168 hours. Coagulation Profile: No results for input(s): INR, PROTIME in the last 168 hours. Cardiac Enzymes: No results for input(s): CKTOTAL, CKMB, CKMBINDEX, TROPONINI in the last 168 hours. BNP (last 3 results) No results for input(s): PROBNP in the last 8760 hours. HbA1C: No results for input(s): HGBA1C in the last 72 hours. CBG: No results for input(s): GLUCAP in the last 168 hours. Lipid Profile: No results for input(s): CHOL, HDL, LDLCALC, TRIG, CHOLHDL, LDLDIRECT in the last 72 hours. Thyroid Function Tests: No results for input(s): TSH, T4TOTAL, FREET4, T3FREE, THYROIDAB in the last 72 hours. Anemia Panel: No results for input(s): VITAMINB12, FOLATE, FERRITIN, TIBC, IRON, RETICCTPCT in the last 72 hours. Urine analysis:    Component Value Date/Time   COLORURINE YELLOW 08/01/2016 [place] 08/01/2016 0539   LABSPEC 1.007 08/01/2016 0539   PHURINE 6.0 08/01/2016 0539   GLUCOSEU NEGATIVE 08/01/2016 0539   HGBUR NEGATIVE 08/01/2016 0539   BILIRUBINUR NEGATIVE 08/01/2016 0539   KETONESUR NEGATIVE 08/01/2016 0539   PROTEINUR NEGATIVE 08/01/2016 0539   NITRITE NEGATIVE 08/01/2016 0539   LEUKOCYTESUR MODERATE (A) 08/01/2016 0539    Radiological Exams on Admission: Dg Chest 2 View  Result Date: 05/21/2018 CLINICAL DATA:  Dyspnea EXAM: CHEST - 2 VIEW COMPARISON:  04/25/2017 chest radiograph. FINDINGS: Stable configuration of 2 lead left subclavian pacemaker. Stable cardiomediastinal silhouette with mild cardiomegaly. No pneumothorax. No pleural effusion. Cephalization of the pulmonary vasculature without overt pulmonary edema. No acute  consolidative airspace disease. IMPRESSION: Mild cardiomegaly without overt pulmonary edema. Electronically Signed   By: Ilona Sorrel M.D.   On: 05/21/2018 19:01    EKG: Independently  reviewed.  Assessment/Plan Principal Problem:   Acute on chronic diastolic CHF (congestive heart failure) (HCC) Active Problems:   Diabetes mellitus with complication (HCC)   Hypertension   PAF (paroxysmal atrial fibrillation) (HCC)   Cardiac pacemaker in situ - St Jude   Acute respiratory failure with hypoxia ([place])    1. Acute resp failure with hypoxia - CHF favored over acute bronchitis given Orthopnea, peripheral edema, rapid recent wt gain 1. Treat CHF first 2. Adult wheeze protocol 3. Will hold off on further steroids for now 1. If no improvement with CHF treatment, then consider switching gears and trying to treat as acute bronchitis 4. O2 via [place] 5. Cont pulse ox 2. Acute on chronic diastolic CHF - 1. CHF pathway 2. Give a second 40mg  IV lasix x1 now 3. Lasix 80mg  IV daily ordered 4. Strict intake and output 5. Replace K 75meq PO x1 now 6. BMP daily 7. 2d echo 8. Tele monitor 3. PAF - 1. Continue toprol 2. Continue Flecainide 3. Continue Eliquis 4. HTN - Continue home BP meds: imdur, toprol 5. DM2 - 1. Will do Lantus 41 QHS 2. Mod scale SSI AC  DVT prophylaxis: Eliquis Code Status: Full Family Communication: Husband at bedside Disposition Plan: Home after admit Consults called: None Admission status: Place in obs - new O2 requirement, will put on obs status for now but suspect she may need to be converted to IP tomorrow.   Etta Quill DO [facility] Pager [phone] Only works nights!  If 7AM-7PM, please contact the primary day team physician taking care of patient  [email or web address] Password [facility]  05/21/2018, 11:00 PM

## 2018-05-21 NOTE — ED Notes (Signed)
ED TO INPATIENT HANDOFF REPORT  Name/Age/Gender Mary [name] 77 y.o. female  Code Status    Code Status Orders  (From admission, onward)         Start     Ordered   05/21/18 2243  Full code  Continuous     05/21/18 2246        Code Status History    Date Active Date Inactive Code Status Order ID Comments User Context   08/02/2016 0105 08/05/2016 1817 Full Code [id number]  Ivor Costa, MD ED   02/04/2015 1051 02/04/2015 1613 Full Code [id number]  Belva Crome, MD Inpatient   01/27/2015 1717 01/28/2015 1820 Full Code [id number]  Liliane Shi, PA-C Inpatient   01/03/2014 1649 01/04/2014 1607 Full Code [id number]  [name], MD Inpatient   01/02/2014 1257 01/03/2014 1649 Full Code [id number]  Evans Lance, MD Inpatient   06/25/2013 1519 06/27/2013 1412 Full Code [id number]  Belva Crome, MD ED      Home/SNF/Other Home  Chief Complaint Flu Like Symptoms / Pneumonia   Level of Care/Admitting Diagnosis ED Disposition    ED Disposition Condition [place]: [facility] [[id number]]  Level of Care: Telemetry [5]  Admit to tele based on following criteria: Acute CHF  Diagnosis: Acute on chronic diastolic CHF (congestive heart failure) [facility]) [[id number]]  Admitting Physician: Etta Quill [phone]  Attending Physician: Etta Quill [[id number]]  PT Class (Do Not Modify): Observation [104]  PT Acc Code (Do Not Modify): Observation [10022]       Medical History Past Medical History:  Diagnosis Date  . Arthritis   . Atrial tachycardia ([place]) 04/23/2017  . CAD (coronary artery disease)    non obstructive  . 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 Procedure complicated by an atrial lead dislodgment and repeat procedure, 12/2013   . Carotid stenosis    Carotid US 5/16:  Bilateral ICA 1-39%; L vertebral retrograde; L BP 126/49, R BP 140/57  . CHF (congestive heart failure) ([place])   . Chronic  lower back pain   . Colon cancer ([place])    a. s/p chemo  . Colon polyps   . Diabetic peripheral neuropathy ([place])   . DVT (deep venous thrombosis) ([place]) 2013   "twice behind knee on left side" (06/25/2013)  . Glaucoma   . Hyperlipidemia   . Hypertension   . Hypothyroidism   . Iron deficiency anemia   . Multinodular goiter   . Osteoporosis   . Sleep apnea    a. resolved post weight loss   . Type II diabetes mellitus ([facility])     Allergies Allergies  Allergen Reactions  . Other Other (See Comments)    NO Blind Scopes with Naso Gastric tube.  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 Adhesive [Tape]     Tape - burns - pls use paper tape or elastic bandage  . Aspirin Other (See Comments)    S/P gastric bypass surgery, states her MD told her to not take Aspirin.  . Latex     Tape= burns  . Nsaids Other (See Comments)    S/P gastric bypass-told not to take. GI bleeds with NSAIDS    IV Location/Drains/Wounds Patient Lines/Drains/Airways Status   Active Line/Drains/Airways    Name:   Placement date:   Placement time:   Site:   Days:   Peripheral IV 05/21/18 Left;Medial Antecubital  05/21/18    2000    Antecubital   less than 1   Incision 06/26/13 Chest Left;Anterior   06/26/13    1208     1790          Labs/Imaging Results for orders placed or performed during the hospital encounter of 05/21/18 (from the past 48 hour(s))  CBC with Differential     Status: Abnormal   Collection Time: 05/21/18  6:13 PM  Result Value Ref Range   WBC 9.6 4.0 - 10.5 K/uL   RBC 3.96 3.87 - 5.11 MIL/uL   Hemoglobin 9.8 (L) 12.0 - 15.0 g/dL   HCT 31.8 (L) 36.0 - 46.0 %   MCV 80.3 78.0 - 100.0 fL   MCH 24.7 (L) 26.0 - 34.0 pg   MCHC 30.8 30.0 - 36.0 g/dL   RDW 15.5 11.5 - 15.5 %   Platelets 445 (H) 150 - 400 K/uL   Neutrophils Relative % 70 %   Neutro Abs 6.6 1.7 - 7.7 K/uL   Lymphocytes Relative 21 %   Lymphs Abs 2.1 0.7 - 4.0 K/uL   Monocytes Relative 8 %   Monocytes Absolute 0.7 0.1 - 1.0 K/uL    Eosinophils Relative 1 %   Eosinophils Absolute 0.1 0.0 - 0.7 K/uL   Basophils Relative 0 %   Basophils Absolute 0.0 0.0 - 0.1 K/uL    Comment: Performed at [facility], [place] [address] [place], [place] [address]  Basic metabolic panel     Status: Abnormal   Collection Time: 05/21/18  6:13 PM  Result Value Ref Range   Sodium 132 (L) 135 - 145 mmol/L   Potassium 3.3 (L) 3.5 - 5.1 mmol/L   Chloride 87 (L) 98 - 111 mmol/L   CO2 31 22 - 32 mmol/L   Glucose, Bld 101 (H) 70 - 99 mg/dL   BUN 19 8 - 23 mg/dL   Creatinine, Ser 1.08 (H) 0.44 - 1.00 mg/dL   Calcium 9.2 8.9 - 10.3 mg/dL   GFR calc non Af Amer 49 (L) >60 mL/min   GFR calc Af Amer 56 (L) >60 mL/min    Comment: (NOTE) The eGFR has been calculated using the CKD EPI equation. This calculation has not been validated in all clinical situations. eGFR's persistently <60 mL/min signify possible Chronic Kidney Disease.    Anion gap 14 5 - 15    Comment: Performed at [facility], [place] [address], [place], [place] [address]  Brain natriuretic peptide     Status: Abnormal   Collection Time: 05/21/18  6:13 PM  Result Value Ref Range   B Natriuretic Peptide 257.3 (H) 0.0 - 100.0 pg/mL    Comment: Performed at [facility], [place] [address] [place], [place] [address]  D-dimer, quantitative     Status: None   Collection Time: 05/21/18  6:13 PM  Result Value Ref Range   D-Dimer, Quant 0.41 0.00 - 0.50 ug/mL-FEU    Comment: (NOTE) At the manufacturer cut-off of 0.50 ug/mL FEU, this assay has been documented to exclude PE with a sensitivity and negative predictive value of 97 to 99%.  At this time, this assay has not been approved by the FDA to exclude DVT/VTE. Results should be correlated with clinical presentation. Performed at [facility], [place] [address] [place], [place] [address]   I-stat troponin, ED     Status: None   Collection Time: 05/21/18  6:20 PM   Result Value  Ref Range   Troponin i, poc 0.00 0.00 - 0.08 ng/mL   Comment 3            Comment: Due to the release kinetics of cTnI, a negative result within the first hours of the onset of symptoms does not rule out myocardial infarction with certainty. If myocardial infarction is still suspected, repeat the test at appropriate intervals.   I-stat troponin, ED     Status: None   Collection Time: 05/21/18  9:53 PM  Result Value Ref Range   Troponin i, poc 0.00 0.00 - 0.08 ng/mL   Comment 3            Comment: Due to the release kinetics of cTnI, a negative result within the first hours of the onset of symptoms does not rule out myocardial infarction with certainty. If myocardial infarction is still suspected, repeat the test at appropriate intervals.    Dg Chest 2 View  Result Date: 05/21/2018 CLINICAL DATA:  Dyspnea EXAM: CHEST - 2 VIEW COMPARISON:  04/25/2017 chest radiograph. FINDINGS: Stable configuration of 2 lead left subclavian pacemaker. Stable cardiomediastinal silhouette with mild cardiomegaly. No pneumothorax. No pleural effusion. Cephalization of the pulmonary vasculature without overt pulmonary edema. No acute consolidative airspace disease. IMPRESSION: Mild cardiomegaly without overt pulmonary edema. Electronically Signed   By: Ilona Sorrel M.D.   On: 05/21/2018 19:01    Pending Labs Unresulted Labs (From admission, onward)    Start     Ordered   05/22/18 0981  Basic metabolic panel  Daily,   R     05/21/18 2246          Vitals/Pain Today's Vitals   05/21/18 1817 05/21/18 1830 05/21/18 1900 05/21/18 1907  BP: 137/66 (!) 153/67 (!) 145/54   Pulse: 66 71 75   Resp: 13 15 (!) 23   Temp:      TempSrc:      SpO2: (!) 89% 100% (!) 86%   PainSc:    4     Isolation Precautions No active isolations  Medications Medications  sodium chloride flush (NS) 0.9 % injection 3 mL (has no administration in time range)  sodium chloride flush (NS) 0.9 % injection 3  mL (has no administration in time range)  0.9 %  sodium chloride infusion (has no administration in time range)  acetaminophen (TYLENOL) tablet 650 mg (has no administration in time range)  ondansetron (ZOFRAN) injection 4 mg (has no administration in time range)  potassium chloride SA (K-DUR,KLOR-CON) CR tablet 40 mEq (has no administration in time range)  levothyroxine (SYNTHROID, LEVOTHROID) tablet 125 mcg (has no administration in time range)  metoprolol succinate (TOPROL-XL) 24 hr tablet 12.5 mg (has no administration in time range)  gabapentin (NEURONTIN) capsule 300 mg (has no administration in time range)  flecainide (TAMBOCOR) tablet 75 mg (has no administration in time range)  apixaban (ELIQUIS) tablet 5 mg (has no administration in time range)  buPROPion (WELLBUTRIN XL) 24 hr tablet 150 mg (has no administration in time range)  latanoprost (XALATAN) 0.005 % ophthalmic solution 1 drop (has no administration in time range)  atorvastatin (LIPITOR) tablet 80 mg (has no administration in time range)  furosemide (LASIX) injection 40 mg (has no administration in time range)  furosemide (LASIX) injection 80 mg (has no administration in time range)  insulin aspart (novoLOG) injection 0-15 Units (has no administration in time range)  insulin glargine (LANTUS) injection 41 Units (has no administration in time range)  HYDROcodone-acetaminophen (NORCO/VICODIN) 5-325  MG per tablet 1 tablet (has no administration in time range)  ondansetron (ZOFRAN) injection 4 mg (4 mg Intravenous Given 05/21/18 1815)  albuterol (PROVENTIL) (2.5 MG/3ML) 0.083% nebulizer solution 5 mg (5 mg Nebulization Given 05/21/18 1823)  albuterol (PROVENTIL) (2.5 MG/3ML) 0.083% nebulizer solution 5 mg (5 mg Nebulization Given 05/21/18 2000)  methylPREDNISolone sodium succinate (SOLU-MEDROL) 125 mg/2 mL injection 125 mg (125 mg Intravenous Given 05/21/18 2000)  furosemide (LASIX) injection 40 mg (40 mg Intravenous Given 05/21/18  2000)    Mobility walks with person assist

## 2018-05-21 NOTE — ED Provider Notes (Signed)
[facility] DEPT Provider Note   CSN: [id number] Arrival date & time: 05/21/18  1551     History   Chief Complaint Chief Complaint  Patient presents with  . Cough  . Fatigue    HPI Mary [name] is a 77 y.o. female.  HPI   Mary [name] is a 77 y.o. female, with a history of CAD, pacemaker, CHF, DVT, HTN, hypothyroidism, and DM, presenting to the ED with productive cough for the past two weeks. Accompanied by shortness of breath and chest pain. Episode of chest pain last night was retrosternal, sharp, nonradiating, 4/10, resolved with NTG. Accompanied by nausea, blood streaked sputum, increased orthopnea, and increased peripheral edema. Has had loose stools, but states this is not unusual for her.  Had upper respiratory congestion and cough. Symptoms improved at the end of last week, but then recurred and were worse.  She used her husband's albuterol nebulizer this morning with improvement in her breathing. On 9/11, patient's Toprol-XL was decreased from 25mg  daily to 12.5 mg daily.  Denies fever, vomiting, abdominal pain, hematochezia/melena, or any other complaints.  Cardiologist: [name]   Past Medical History:  Diagnosis Date  . Arthritis   . Atrial tachycardia ([place]) 04/23/2017  . CAD (coronary artery disease)    non obstructive  . 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 Procedure complicated by an atrial lead dislodgment and repeat procedure, 12/2013   . Carotid stenosis    Carotid US 5/16:  Bilateral ICA 1-39%; L vertebral retrograde; L BP 126/49, R BP 140/57  . CHF (congestive heart failure) ([place])   . Chronic lower back pain   . Colon cancer ([place])    a. s/p chemo  . Colon polyps   . Diabetic peripheral neuropathy ([place])   . DVT (deep venous thrombosis) ([place]) 2013   "twice behind knee on left side" (06/25/2013)  . Glaucoma   . Hyperlipidemia   . Hypertension   .  Hypothyroidism   . Iron deficiency anemia   . Multinodular goiter   . Osteoporosis   . Sleep apnea    a. resolved post weight loss   . Type II diabetes mellitus [facility])     Patient Active Problem List   Diagnosis Date Noted  . Acute on chronic diastolic CHF (congestive heart failure) ([place]) 05/21/2018  . Acute respiratory failure with hypoxia ([place]) 05/21/2018  . Rash 12/09/2017  . Osteoarthritis 05/25/2017  . Atrial tachycardia ([place]) 04/23/2017  . Second degree AV block, Mobitz type II   . Depression 08/02/2016  . Chronic diastolic heart failure ([place]) 08/02/2016  . DVT (deep vein thrombosis) in pregnancy ([place]) 08/02/2016  . Tobacco abuse 08/02/2016  . Right lumbar radiculitis 06/09/2016  . Tear of medial meniscus of right knee, current 04/22/2016  . Chronic venous insufficiency 02/21/2015  . 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 Atrioventricular block, complete ([place]) 01/02/2014  . PAF (paroxysmal atrial fibrillation) ([place]) 08/29/2013  . Obesity 05/05/2013  . CAD (coronary artery disease)   . Diabetes mellitus with complication ([place])   . Hypothyroidism   . Hypertension   . Colon cancer ([place])   . Hyperlipidemia     Past Surgical History:  Procedure Laterality Date  . CARDIAC CATHETERIZATION  2006   Never had PCI, 3 caths total. Last one in [place]  . CARDIAC CATHETERIZATION N/A 02/04/2015   Procedure: Left Heart  Cath and Coronary Angiography;  Surgeon: Belva Crome, MD;  Location: [facility] CV LAB;  Service: Cardiovascular;  Laterality: N/A;  . CATARACT EXTRACTION, BILATERAL Bilateral 2013   "and put stent in my left eye for glaucoma" (06/25/2013)  . CHOLECYSTECTOMY  1980's  . COLECTOMY  10/2010   Tumor removal  . EYE MUSCLE SURGERY Bilateral ~ 1963   "muscles too long; eyes would go out and up; tied muscles to hold my eyes straight" (06/25/2013)  . LEAD REVISION  01-03-2014   atrial lead revision by Dr Rayann Heman  . LEAD  REVISION N/A 01/03/2014   Procedure: LEAD REVISION;  Surgeon: Coralyn Mark, MD;  Location: [facility] CATH LAB;  Service: Cardiovascular;  Laterality: N/A;  . PACEMAKER INSERTION  01/02/2014   STJ Assurity dual chamber pacemaker implnated by Dr Lovena Le for SSS  . PERMANENT PACEMAKER INSERTION N/A 01/02/2014   Procedure: PERMANENT PACEMAKER INSERTION;  Surgeon: Evans Lance, MD;  Location: [facility] CATH LAB;  Service: Cardiovascular;  Laterality: N/A;  . PORTACATH PLACEMENT Left 11/2010  . ROUX-EN-Y GASTRIC BYPASS  2009  . ULNAR TUNNEL RELEASE Left ~ 2008  . UMBILICAL HERNIA REPAIR  1970's?    (06/25/2013)     OB History   None      Home Medications    Prior to Admission medications   Medication Sig Start Date End Date Taking? Authorizing Provider  atorvastatin (LIPITOR) 80 MG tablet TAKE 1 TABLET(80 MG) BY MOUTH DAILY 04/18/18  Yes Hoyt Koch, MD  bimatoprost (LUMIGAN) 0.01 % SOLN Place 1 drop into the right eye at bedtime.    Yes [provider]  buPROPion (WELLBUTRIN XL) 150 MG 24 hr tablet Take 1 tablet (150 mg total) by mouth daily. 05/12/18  Yes Hoyt Koch, MD  ELIQUIS 5 MG TABS tablet TAKE 1 TABLET BY MOUTH TWICE A DAY 04/20/17  Yes Allred, Jeneen Rinks, MD  flecainide (TAMBOCOR) 50 MG tablet Take 1.5 tablets (75 mg total) by mouth 2 (two) times daily. 05/10/18  Yes Seiler, Amber K, NP  furosemide (LASIX) 80 MG tablet Take 1 tablet (80 mg total) by mouth daily. 01/24/18  Yes Hoyt Koch, MD  gabapentin (NEURONTIN) 300 MG capsule Take 300 mg by mouth at bedtime.    Yes [provider]  HYDROcodone-acetaminophen (NORCO/VICODIN) 5-325 MG tablet Take 1 tablet by mouth every 4 (four) hours as needed for moderate pain. 03/21/18  Yes Hoyt Koch, MD  Insulin Glargine (BASAGLAR KWIKPEN) 100 UNIT/ML SOPN Inject 64 Units into the skin daily. 01/09/18  Yes [provider]  levothyroxine (SYNTHROID, LEVOTHROID) 125 MCG tablet Take 125 mcg by mouth  daily before breakfast. 01/10/18  Yes [provider]  metoprolol succinate (TOPROL XL) 25 MG 24 hr tablet Take 0.5 tablets (12.5 mg total) by mouth at bedtime. 05/10/18  Yes Seiler, Amber K, NP  nitroGLYCERIN (NITROSTAT) 0.4 MG SL tablet Place 1 tablet (0.4 mg total) under the tongue every 5 (five) minutes as needed for chest pain. 08/26/17  Yes Belva Crome, MD  potassium chloride SA (K-DUR,KLOR-CON) 20 MEQ tablet TAKE 2 TABLETS BY MOUTH ONCE DAILY 08/15/17  Yes Eileen Stanford, PA-C    Family History Family History  Problem Relation Age of Onset  . Breast cancer Mother   . Diabetes Mother   . Heart disease Father   . Heart attack Father   . Heart attack Sister   . Heart attack Brother   . Bladder Cancer Brother   .  Prostate cancer Brother   . Colon polyps Brother   . Breast cancer Sister   . Breast cancer Maternal Aunt        x 2 aunts  . Pancreatic cancer Brother   . Colon polyps Brother   . Bladder Cancer Cousin   . Clotting disorder Sister   . Diabetes Sister        x 3  . Diabetes Maternal Grandmother     Social History Social History   Tobacco Use  . Smoking status: Former Smoker    Packs/day: 1.00    Years: 30.00    Pack years: 30.00    Types: Cigarettes    Last attempt to quit: 02/2017    Years since quitting: 1.2  . Smokeless tobacco: Never Used  . Tobacco comment: 06/25/2013 "smoked off and on since I was 22; stopped 10/2007 til 08/30/2012 then started again cause of stress"  Substance Use Topics  . Alcohol use: Not Currently    Alcohol/week: 0.0 standard drinks  . Drug use: No     Allergies   Other; Adhesive [tape]; Aspirin; Latex; and Nsaids   Review of Systems Review of Systems  Constitutional: Negative for chills, diaphoresis and fever.  HENT: Positive for congestion.   Respiratory: Positive for cough and shortness of breath.        Increased orthopnea  Cardiovascular: Positive for chest pain and leg swelling.  Gastrointestinal:  Positive for nausea. Negative for abdominal pain, diarrhea and vomiting.  All other systems reviewed and are negative.    Physical Exam Updated Vital Signs BP (!) 146/61   Pulse 73   Temp 98.2 F (36.8 C) (Oral)   Resp 16   SpO2 97%   Physical Exam  Constitutional: She appears well-developed and well-nourished. No distress.  HENT:  Head: Normocephalic and atraumatic.  Eyes: Conjunctivae are normal.  Neck: Neck supple.  Cardiovascular: Normal rate, regular rhythm, normal heart sounds and intact distal pulses.  Pulmonary/Chest: She has decreased breath sounds (global). She has wheezes (global).  Abdominal: Soft. There is no tenderness. There is no guarding.  Musculoskeletal: She exhibits edema.  Bilateral lower extremity edema, appears to be equal.  Lymphadenopathy:    She has no cervical adenopathy.  Neurological: She is alert.  Skin: Skin is warm and dry. She is not diaphoretic.  Psychiatric: She has a normal mood and affect. Her behavior is normal.  Nursing note and vitals reviewed.    ED Treatments / Results  Labs (all labs ordered are listed, but only abnormal results are displayed) Labs Reviewed  CBC WITH DIFFERENTIAL/PLATELET - Abnormal; Notable for the following components:      Result Value   Hemoglobin 9.8 (*)    HCT 31.8 (*)    MCH 24.7 (*)    Platelets 445 (*)    All other components within normal limits  BASIC METABOLIC PANEL - Abnormal; Notable for the following components:   Sodium 132 (*)    Potassium 3.3 (*)    Chloride 87 (*)    Glucose, Bld 101 (*)    Creatinine, Ser 1.08 (*)    GFR calc non Af Amer 49 (*)    GFR calc Af Amer 56 (*)    All other components within normal limits  BRAIN NATRIURETIC PEPTIDE - Abnormal; Notable for the following components:   B Natriuretic Peptide 257.3 (*)    All other components within normal limits  D-DIMER, QUANTITATIVE (NOT AT [facility])  BASIC METABOLIC PANEL  I-STAT TROPONIN,  ED  I-STAT TROPONIN, ED     EKG EKG Interpretation  Date/Time:  Sunday May 21 2018 17:52:33 EDT Ventricular Rate:  72 PR Interval:    QRS Duration: 197 QT Interval:  503 QTC Calculation: 551 R Axis:   -112 Text Interpretation:  Atrial-ventricular dual-paced rhythm No further analysis attempted due to paced rhythm No significant change since last tracing Confirmed by Wandra Arthurs [phone]) on 05/21/2018 5:54:01 PM   Radiology Dg Chest 2 View  Result Date: 05/21/2018 CLINICAL DATA:  Dyspnea EXAM: CHEST - 2 VIEW COMPARISON:  04/25/2017 chest radiograph. FINDINGS: Stable configuration of 2 lead left subclavian pacemaker. Stable cardiomediastinal silhouette with mild cardiomegaly. No pneumothorax. No pleural effusion. Cephalization of the pulmonary vasculature without overt pulmonary edema. No acute consolidative airspace disease. IMPRESSION: Mild cardiomegaly without overt pulmonary edema. Electronically Signed   By: Ilona Sorrel M.D.   On: 05/21/2018 19:01    Procedures Procedures (including critical care time)  Medications Ordered in ED Medications  sodium chloride flush (NS) 0.9 % injection 3 mL (has no administration in time range)  sodium chloride flush (NS) 0.9 % injection 3 mL (has no administration in time range)  0.9 %  sodium chloride infusion (has no administration in time range)  acetaminophen (TYLENOL) tablet 650 mg (has no administration in time range)  ondansetron (ZOFRAN) injection 4 mg (has no administration in time range)  potassium chloride SA (K-DUR,KLOR-CON) CR tablet 40 mEq (has no administration in time range)  levothyroxine (SYNTHROID, LEVOTHROID) tablet 125 mcg (has no administration in time range)  metoprolol succinate (TOPROL-XL) 24 hr tablet 12.5 mg (has no administration in time range)  gabapentin (NEURONTIN) capsule 300 mg (has no administration in time range)  flecainide (TAMBOCOR) tablet 75 mg (has no administration in time range)  apixaban (ELIQUIS) tablet 5 mg (has no  administration in time range)  buPROPion (WELLBUTRIN XL) 24 hr tablet 150 mg (has no administration in time range)  latanoprost (XALATAN) 0.005 % ophthalmic solution 1 drop (has no administration in time range)  atorvastatin (LIPITOR) tablet 80 mg (has no administration in time range)  furosemide (LASIX) injection 40 mg (has no administration in time range)  furosemide (LASIX) injection 80 mg (has no administration in time range)  insulin aspart (novoLOG) injection 0-15 Units (has no administration in time range)  insulin glargine (LANTUS) injection 41 Units (has no administration in time range)  HYDROcodone-acetaminophen (NORCO/VICODIN) 5-325 MG per tablet 1 tablet (has no administration in time range)  ondansetron (ZOFRAN) injection 4 mg (4 mg Intravenous Given 05/21/18 1815)  albuterol (PROVENTIL) (2.5 MG/3ML) 0.083% nebulizer solution 5 mg (5 mg Nebulization Given 05/21/18 1823)  albuterol (PROVENTIL) (2.5 MG/3ML) 0.083% nebulizer solution 5 mg (5 mg Nebulization Given 05/21/18 2000)  methylPREDNISolone sodium succinate (SOLU-MEDROL) 125 mg/2 mL injection 125 mg (125 mg Intravenous Given 05/21/18 2000)  furosemide (LASIX) injection 40 mg (40 mg Intravenous Given 05/21/18 2000)     Initial Impression / Assessment and Plan / ED Course  I have reviewed the triage vital signs and the nursing notes.  Pertinent labs & imaging results that were available during my care of the patient were reviewed by me and considered in my medical decision making (see chart for details).  Clinical Course as of May 21 2344  Sun May 21, 2018  2222 Consistent with previous values.  Hemoglobin(!): 9.8 [SJ]  2235 Spoke with Dr. Alcario Drought, hospitalist.  Agrees to admit the patient.   [SJ]    Clinical Course User Index [  SJ] Joy, Shawn C, PA-C    Patient presents with shortness of breath and cough.  Symptoms could be due to a CHF exacerbation versus bronchitis or combination.  She has elevated BNP, though not  impressive, this is combined with increased orthopnea, episodes of chest pain, and increased peripheral edema. She becomes hypoxic with movement, especially with ambulation.  She will need to be admitted for further management.   Findings and plan of care discussed with Shirlyn Goltz, MD. Dr. Darl Householder personally evaluated and examined this patient.  Vitals:   05/21/18 1650 05/21/18 1657 05/21/18 1730 05/21/18 1745  BP: (!) 146/61 (!) 146/61 (!) 117/96   Pulse:  73 80 78  Resp:  16 (!) 23 19  Temp:      TempSrc:      SpO2:  97% 92% 97%   Vitals:   05/21/18 2130 05/21/18 2145 05/21/18 2200 05/21/18 2300  BP: 120/78  (!) 132/57 116/66  Pulse:  73 71   Resp: 20 (!) 21 (!) 29 15  Temp:      TempSrc:      SpO2:  92% (!) 88%      Final Clinical Impressions(s) / ED Diagnoses   Final diagnoses:  Shortness of breath  Hypoxia    ED Discharge Orders    None       Layla Maw 05/21/18 2345    Drenda Freeze, MD 05/24/18 [phone]

## 2018-05-21 NOTE — ED Triage Notes (Signed)
Pt is c/o cough, general weakness and not feeling well. She adds that she suspects that she may have pneumonia. Remarks on hx of CAD, CHF and diabetes and recently seen her cardiologist who adjusted her meds.

## 2018-05-22 ENCOUNTER — Other Ambulatory Visit: Payer: Self-pay

## 2018-05-22 ENCOUNTER — Observation Stay (HOSPITAL_COMMUNITY): Payer: Medicare Other

## 2018-05-22 DIAGNOSIS — Z5321 Procedure and treatment not carried out due to patient leaving prior to being seen by health care provider: Secondary | ICD-10-CM | POA: Diagnosis not present

## 2018-05-22 DIAGNOSIS — Z7901 Long term (current) use of anticoagulants: Secondary | ICD-10-CM | POA: Diagnosis not present

## 2018-05-22 DIAGNOSIS — J440 Chronic obstructive pulmonary disease with acute lower respiratory infection: Secondary | ICD-10-CM | POA: Diagnosis present

## 2018-05-22 DIAGNOSIS — Z9221 Personal history of antineoplastic chemotherapy: Secondary | ICD-10-CM | POA: Diagnosis not present

## 2018-05-22 DIAGNOSIS — J9601 Acute respiratory failure with hypoxia: Secondary | ICD-10-CM | POA: Diagnosis not present

## 2018-05-22 DIAGNOSIS — Z86718 Personal history of other venous thrombosis and embolism: Secondary | ICD-10-CM | POA: Diagnosis not present

## 2018-05-22 DIAGNOSIS — Z950 Presence of cardiac pacemaker: Secondary | ICD-10-CM | POA: Diagnosis not present

## 2018-05-22 DIAGNOSIS — I480 Paroxysmal atrial fibrillation: Secondary | ICD-10-CM | POA: Diagnosis present

## 2018-05-22 DIAGNOSIS — E785 Hyperlipidemia, unspecified: Secondary | ICD-10-CM | POA: Diagnosis present

## 2018-05-22 DIAGNOSIS — Z85038 Personal history of other malignant neoplasm of large intestine: Secondary | ICD-10-CM | POA: Diagnosis not present

## 2018-05-22 DIAGNOSIS — E039 Hypothyroidism, unspecified: Secondary | ICD-10-CM | POA: Diagnosis present

## 2018-05-22 DIAGNOSIS — R0602 Shortness of breath: Secondary | ICD-10-CM | POA: Diagnosis not present

## 2018-05-22 DIAGNOSIS — Z79899 Other long term (current) drug therapy: Secondary | ICD-10-CM | POA: Diagnosis not present

## 2018-05-22 DIAGNOSIS — J441 Chronic obstructive pulmonary disease with (acute) exacerbation: Secondary | ICD-10-CM | POA: Diagnosis present

## 2018-05-22 DIAGNOSIS — I5043 Acute on chronic combined systolic (congestive) and diastolic (congestive) heart failure: Secondary | ICD-10-CM | POA: Diagnosis present

## 2018-05-22 DIAGNOSIS — I110 Hypertensive heart disease with heart failure: Secondary | ICD-10-CM | POA: Diagnosis present

## 2018-05-22 DIAGNOSIS — M1990 Unspecified osteoarthritis, unspecified site: Secondary | ICD-10-CM | POA: Diagnosis present

## 2018-05-22 DIAGNOSIS — M810 Age-related osteoporosis without current pathological fracture: Secondary | ICD-10-CM | POA: Diagnosis present

## 2018-05-22 DIAGNOSIS — J209 Acute bronchitis, unspecified: Secondary | ICD-10-CM | POA: Diagnosis present

## 2018-05-22 DIAGNOSIS — Z9884 Bariatric surgery status: Secondary | ICD-10-CM | POA: Diagnosis not present

## 2018-05-22 DIAGNOSIS — G4730 Sleep apnea, unspecified: Secondary | ICD-10-CM | POA: Diagnosis present

## 2018-05-22 DIAGNOSIS — I10 Essential (primary) hypertension: Secondary | ICD-10-CM | POA: Diagnosis not present

## 2018-05-22 DIAGNOSIS — Z87891 Personal history of nicotine dependence: Secondary | ICD-10-CM | POA: Diagnosis not present

## 2018-05-22 DIAGNOSIS — I2510 Atherosclerotic heart disease of native coronary artery without angina pectoris: Secondary | ICD-10-CM | POA: Diagnosis present

## 2018-05-22 DIAGNOSIS — I5033 Acute on chronic diastolic (congestive) heart failure: Secondary | ICD-10-CM | POA: Diagnosis not present

## 2018-05-22 DIAGNOSIS — Z794 Long term (current) use of insulin: Secondary | ICD-10-CM | POA: Diagnosis not present

## 2018-05-22 DIAGNOSIS — E1142 Type 2 diabetes mellitus with diabetic polyneuropathy: Secondary | ICD-10-CM | POA: Diagnosis present

## 2018-05-22 DIAGNOSIS — H409 Unspecified glaucoma: Secondary | ICD-10-CM | POA: Diagnosis present

## 2018-05-22 DIAGNOSIS — E118 Type 2 diabetes mellitus with unspecified complications: Secondary | ICD-10-CM | POA: Diagnosis not present

## 2018-05-22 LAB — BASIC METABOLIC PANEL
Anion gap: 10 (ref 5–15)
BUN: 23 mg/dL (ref 8–23)
CHLORIDE: 87 mmol/L — AB (ref 98–111)
CO2: 31 mmol/L (ref 22–32)
CREATININE: 1.23 mg/dL — AB (ref 0.44–1.00)
Calcium: 8.5 mg/dL — ABNORMAL LOW (ref 8.9–10.3)
GFR calc Af Amer: 48 mL/min — ABNORMAL LOW (ref >60)
GFR calc non Af Amer: 42 mL/min — ABNORMAL LOW (ref >60)
GLUCOSE: 378 mg/dL — AB (ref 70–99)
Potassium: 4.7 mmol/L (ref 3.5–5.1)
Sodium: 128 mmol/L — ABNORMAL LOW (ref 135–145)

## 2018-05-22 LAB — GLUCOSE, CAPILLARY: Glucose-Capillary: 252 mg/dL — ABNORMAL HIGH (ref 70–99)

## 2018-05-22 LAB — PROCALCITONIN

## 2018-05-22 MED ORDER — ARFORMOTEROL TARTRATE 15 MCG/2ML IN NEBU: 15.0000 ug | INHALATION_SOLUTION | Freq: Two times a day (BID) | RESPIRATORY_TRACT | Status: DC

## 2018-05-22 MED ORDER — PREDNISONE 20 MG PO TABS: 40.0000 mg | ORAL_TABLET | Freq: Every day | ORAL | Status: DC

## 2018-05-22 MED ORDER — BUDESONIDE 0.5 MG/2ML IN SUSP: 0.5000 mg | Freq: Two times a day (BID) | RESPIRATORY_TRACT | Status: DC

## 2018-05-22 MED ORDER — IPRATROPIUM-ALBUTEROL 0.5-2.5 (3) MG/3ML IN SOLN
3.0000 mL | Freq: Three times a day (TID) | RESPIRATORY_TRACT | Status: DC
  Administered 2018-05-22 (×2): 3 mL via RESPIRATORY_TRACT
  Filled 2018-05-22 (×2): qty 3

## 2018-05-22 MED ORDER — ALBUTEROL SULFATE (2.5 MG/3ML) 0.083% IN NEBU: 2.5000 mg | INHALATION_SOLUTION | RESPIRATORY_TRACT | Status: DC | PRN

## 2018-05-22 MED ORDER — FUROSEMIDE 40 MG PO TABS: 80.0000 mg | ORAL_TABLET | Freq: Every day | ORAL | Status: DC

## 2018-05-22 NOTE — Care Management Note (Signed)
Case Management Note  Patient Details  Name: Mary [name] MRN: [id number] Date of Birth: 1941-08-27  Subjective/Objective:Admitted w/CHF. From home. Patient very adamant about leaving hospital-she states doesn't like what the doctors determined,she has a cardiologist, & electrophysiologist-she wants to talk to them.  Noted on home 02-may need @ home.                  Action/Plan:d/c home.   Expected Discharge Date:                  Expected Discharge Plan:  Against Medical Advice  In-House Referral:     Discharge planning Services  CM Consult  Post Acute Care Choice:    Choice offered to:     DME Arranged:    DME Agency:     HH Arranged:    [place] Agency:     Status of Service:  Completed, signed off  If discussed at [employer] of Stay Meetings, dates discussed:    Additional Comments:  [name], RN 05/22/2018, 2:48 PM

## 2018-05-22 NOTE — Care Management Obs Status (Signed)
[facility] NOTIFICATION   Patient Details  Name: Mary [name] MRN: [id number] Date of Birth: 1940-09-30   Medicare Observation Status Notification Given:  Yes    MahabirJuliann Pulse, RN 05/22/2018, 2:32 PM

## 2018-05-22 NOTE — Discharge Summary (Signed)
                                                         AMA  Patient at this time expresses desire to leave the Hospital immidiately, patient has been warned that this is not Medically advisable at this time, and can result in Medical complications like Death and Disability, patient understands and accepts the risks involved and assumes full responsibilty of this decision.   Chipper Oman M.D on 05/22/2018 at 4:15 PM  [facility]  Time < 30 minute   Last note below   Attending MD note  Patient was seen, examined,treatment plan was discussed with the PA-S.  I have personally reviewed the clinical findings, lab, imaging studies and management of this patient in detail. I agree with the documentation, as recorded by the PA-S  Patient is 77 year old female with medical history significant for diastolic CHF, PPM, PAF on Eliquis, CAD, diabetes mellitus type 2 and DVT presented to the ED with a productive cough, shortness of breath orthopnea and increasing lower extremity swelling.  Symptoms started 2 weeks prior to admission where she had a cold, she developed shortness of breath and was using her husband nebulizer treatment with some improvement.  Upon ED evaluation she was tachypneic with hypoxia, on exam there was wheezing and rhonchi.  Chest x-ray showed cardiomegaly but no pulmonary edema.  BNP 257 d-dimer and troponins were negative.  Patient was given Lasix, Solu-Medrol and nebulizer treatment and was admitted with acute respiratory failure with hypoxia.  S: Patient breathing has improved and her swelling has decreased however still swelling and requiring O2 supplementation.  She continues to complain of cough.  Denies chest pain and palpitation.  On Exam: Blood pressure (!) 127/58, pulse 72, temperature 97.6 F (36.4 C), temperature source Oral, resp. rate 18, height 5\' 7"  (1.702 m), weight  124.2 kg, SpO2 100 %.  Gen. exam: Awake, alert, not in any distress Chest: Decreased breath sounds, diffuse early and late expiratory wheezing, diffuse rhonchi. CVS: S1-S2 regular, no murmurs Abdomen: Soft, nontender and nondistended Neurology: Non-focal Skin: No rash or lesions Extremities: Bilateral lower extremity edema 2+  Acute respiratory failure with hypoxia Felt to be multifactorial, chronic diastolic CHF and COPD, patient was a chronic smoker and report being smoking for about 2 weeks prior to admission after she quit a year ago.  She reported being stressed out and started smoking however stopped 2 weeks ago.  Patient on exam with diffuse moderate wheezing and very tight.  Picture looks like more COPD exacerbation.  Advised to start prednisone, Brovana, Pulmicort and nebulizer treatment.  Wean oxygen as able.  Smoking cessation discussed.  COPD exacerbation See above Check procalcitonin  Acute on chronic systolic CHF Initially started with IV Lasix, chest x-ray with cardiomegaly no signs of fluid overload.  2D echo 9/23 shows LVEF 40 to 45% with mild LVH.  EF function is reduced since 2018.  Will decrease IV Lasix, continue will need beta-blocker, ACE inhibitor and possible Aldactone. Cardiology consult   DM type 2 CBGs elevated, will adjust insulin pending 24 hr requirement  May benefit from metformin, follow Dr Buddy Duty as outpatient   PAF  Continue Eliquis and metoprolol   HTN  Continue home meds   Chipper Oman, MD

## 2018-05-22 NOTE — Progress Notes (Signed)
PROGRESS NOTE  Mary [name] YWV:[id number] DOB: 1940-11-01 DOA: 05/21/2018 PCP: Hoyt Koch, MD   LOS: 0 days   Brief Narrative / Interim history: Mary [name] is a 77 y/o female with medical history significant for diastolic CHF, PPM for CHB, PAF on eliquis, CAD, DM2, and DVT who presented to the ED 9/22 with productive cough, SOB, orthopnea, rapid wgt gain, and worsening edema. She noticed symptoms about 2 weeks ago and thought she had a cold, however they did not improve and had chest pain yesterday resolved with NTG. Upon arrival to the ED she was tachypneic, sating 88% on RA with rhonci, rales, and wheezing on PE. CXR showed mild cardiomegaly with no overt pulmonary edema, labs significant for BNP: 257, K 3.3, Cr 1.08; D dimer and Troponin negative. She was given 40 IV Lasix x2, 61meq K, one dose Solu-medrol, and neb treatments and admitted for acute respiratory failure with hypoxia in the setting of possible acute on chronic diastolic CHF vs. Bronchitis.    Subjective: Patient states her breathing is much better and the swelling in her legs has decreased, but they still are more swollen than normal. She is still having productive cough. No chest pain, palpitations, abdominal pain, fever/chills.   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 Bronchitis - Last Echo 2017 EF 60-65% - Patient says that husband's home nebulizer treatments were very helpful. Has been congested with productive cough x2 weeks with subjective fevers. Patient given Lasix at ED yesterday and Cr increased 1.08 to 1.23 and GFR down from 49  to 42 today. On PE pt has wheezing and decreased breath sounds. She admits to 30+ pack year smoking history and although quit 1 year ago, started smoking again for a few weeks at the end of August when symptoms seems to has started.  Due to improvement with nebs, decreasing renal function after diuresis, and PE, clinical picture more likely COPD exacerbation  - Currently on 2L [place] oxygen, not on home oxygen   COPD exacerbation - Proventil and duonebs - Start on PO prednisone x5 days - Ordered procalcitonin   Acute on chronic systolic CHF - Initially managed as CHF with IV Lasix 40 mg x2 - CXR mild cardiomegaly, no overt pulmonary edema  - 2D echo 9/23- LVEF 40-45% with mild LVH; has reduced since 2017 echo  - Hold Lasix until AM, not much urine output - Bmp daily  DM type 2 - On Novolog and Lantus 41units at home  - CBG elevated, 378 today  - Was on Metformin but apparently d/c'ed 02/2017 by Dr. Buddy Duty  Paroxsymal Afib - Continue home meds toprolol, flecainide, eliquis  Hypertension - BP stable, continue home meds   Scheduled Meds: . apixaban  5 mg Oral BID  . atorvastatin  80 mg Oral q1800  . buPROPion  150 mg Oral Daily  . flecainide  75 mg Oral BID  . furosemide  80 mg Intravenous Daily  . gabapentin  300 mg Oral QHS  . insulin aspart  0-15 Units Subcutaneous TID WC  . insulin glargine  41 Units Subcutaneous QHS  . ipratropium-albuterol  3 mL Nebulization TID  . latanoprost  1 drop Right Eye QHS  . levothyroxine  125 mcg Oral QAC breakfast  . metoprolol succinate  12.5 mg Oral QHS  . sodium chloride flush  3 mL Intravenous Q12H   Continuous Infusions: . sodium chloride     PRN Meds:.sodium chloride, acetaminophen, HYDROcodone-acetaminophen, ondansetron (ZOFRAN) IV, sodium chloride flush  DVT prophylaxis: Eliquis Code Status: full code Family Communication: none at bedside Disposition Plan: home pending clinical improvement  Consultants:   None  Procedures:   2D  echo: 9/23    Antimicrobials:  none   Objective: Vitals:   05/22/18 0138 05/22/18 0507 05/22/18 0509 05/22/18 0827  BP:  (!) 155/54    Pulse:  74    Resp:  18    Temp:  98.2 F (36.8 C)    TempSrc:  Oral    SpO2:  99%  97%  Weight:   124.2 kg   Height: 5\' 7"  (1.702 m)       Intake/Output Summary (Last 24 hours) at 05/22/2018 1214 Last data filed at 05/22/2018 0820 Gross per 24 hour  Intake 600 ml  Output 1300 ml  Net -700 ml   Filed Weights   05/21/18 2240 05/22/18 0509  Weight: 124.7 kg 124.2 kg    Examination:  Constitutional: Sitting upright on side of the bed, NAD Respiratory: Diffuse wheezing bilaterally, decreased breath sounds  Cardiovascular: regular rate, paced. +pitting edema bilateral LE Abdomen: soft, non tender Musculoskeletal: no clubbing or cyanosis.  Skin: no rashes, lesions, ulcers Neurologic: alert and oriented x3, non focal Psychiatric: normal mood and affect   Data Reviewed: I have independently reviewed following labs and imaging studies   CBC: Recent Labs  Lab 05/21/18 1813  WBC 9.6  NEUTROABS 6.6  HGB 9.8*  HCT 31.8*  MCV 80.3  PLT 481*   Basic Metabolic Panel: Recent Labs  Lab 05/21/18 1813 05/22/18 0535  NA 132* 128*  K 3.3* 4.7  CL 87* 87*  CO2 31 31  GLUCOSE 101* 378*  BUN 19 23  CREATININE 1.08* 1.23*  CALCIUM 9.2 8.5*   GFR: Estimated Creatinine Clearance: 53.2 mL/min (A) (by C-G formula based on SCr of 1.23 mg/dL (H)). Liver Function Tests: No results for input(s): AST, ALT, ALKPHOS, BILITOT, PROT, ALBUMIN in the last 168 hours. No results for input(s): LIPASE, AMYLASE in the last 168 hours. No results for input(s): AMMONIA in the last 168 hours. Coagulation Profile: No results for input(s): INR, PROTIME in the last 168 hours. Cardiac Enzymes: No results for input(s): CKTOTAL, CKMB, CKMBINDEX, TROPONINI in the last 168 hours. BNP (last 3 results) No results for input(s): PROBNP in the last 8760  hours. HbA1C: No results for input(s): HGBA1C in the last 72 hours. CBG: Recent Labs  Lab 05/22/18 1203  GLUCAP 252*   Lipid Profile: No results for input(s): CHOL, HDL, LDLCALC, TRIG, CHOLHDL, LDLDIRECT in the last 72 hours. Thyroid Function Tests: No results for input(s): TSH, T4TOTAL, FREET4, T3FREE, THYROIDAB in the last 72 hours. Anemia Panel: No results for input(s): VITAMINB12, FOLATE, FERRITIN, TIBC, IRON, RETICCTPCT in the last 72 hours. Urine analysis:    Component Value Date/Time   COLORURINE YELLOW 08/01/2016 [place] 08/01/2016 0539   LABSPEC 1.007 08/01/2016 0539   PHURINE 6.0 08/01/2016 [place] 08/01/2016 0539   HGBUR NEGATIVE 08/01/2016 [place] NEGATIVE 08/01/2016 [place] 08/01/2016 [place] NEGATIVE 08/01/2016 0539  NITRITE NEGATIVE 08/01/2016 0539   LEUKOCYTESUR MODERATE (A) 08/01/2016 0539   Sepsis Labs: Invalid input(s): PROCALCITONIN, LACTICIDVEN  No results found for this or any previous visit (from the past 240 hour(s)).    Radiology Studies: Dg Chest 2 View  Result Date: 05/21/2018 CLINICAL DATA:  Dyspnea EXAM: CHEST - 2 VIEW COMPARISON:  04/25/2017 chest radiograph. FINDINGS: Stable configuration of 2 lead left subclavian pacemaker. Stable cardiomediastinal silhouette with mild cardiomegaly. No pneumothorax. No pleural effusion. Cephalization of the pulmonary vasculature without overt pulmonary edema. No acute consolidative airspace disease. IMPRESSION: Mild cardiomegaly without overt pulmonary edema. Electronically Signed   By: Ilona Sorrel M.D.   On: 05/21/2018 19:01       Time spent: [place], PA-S [facility]  If 7PM-7AM, please contact night-coverage [email or web address] Password [email or web address] 05/22/2018, 12:14 PM   [email or web address] @

## 2018-05-22 NOTE — Progress Notes (Signed)
Patient requesting to leave AMA. Writer explained to her the risk of leaving including the fact she was on oxygen. Offered to perform ambulatory saturations required for qualification for home O2 patient declined stating "I don't want oxygen, I don't need it." States "I just want to go see my cardiologist and my husbands COPD doctor." IV and tele removed. AMA paper signed. Dr. Quincy Simmonds notified.

## 2018-05-22 NOTE — Progress Notes (Signed)
Can arrange home 02-if qualifies w/02 sats, & home 02 order,even if leaves AMA.

## 2018-05-23 ENCOUNTER — Other Ambulatory Visit: Payer: Self-pay

## 2018-05-23 ENCOUNTER — Telehealth: Payer: Self-pay | Admitting: *Deleted

## 2018-05-23 ENCOUNTER — Emergency Department (HOSPITAL_COMMUNITY): Payer: Medicare Other

## 2018-05-23 ENCOUNTER — Other Ambulatory Visit: Payer: Self-pay | Admitting: Internal Medicine

## 2018-05-23 ENCOUNTER — Inpatient Hospital Stay (HOSPITAL_COMMUNITY)
Admission: EM | Admit: 2018-05-23 | Discharge: 2018-05-30 | DRG: 286 | Disposition: A | Discharge status: Home / Self Care | Payer: Medicare Other | Attending: Internal Medicine | Admitting: Internal Medicine

## 2018-05-23 ENCOUNTER — Telehealth: Payer: Self-pay | Admitting: Interventional Cardiology

## 2018-05-23 ENCOUNTER — Encounter (HOSPITAL_COMMUNITY): Payer: Self-pay | Admitting: Emergency Medicine

## 2018-05-23 DIAGNOSIS — R0602 Shortness of breath: Secondary | ICD-10-CM | POA: Diagnosis not present

## 2018-05-23 DIAGNOSIS — K5900 Constipation, unspecified: Secondary | ICD-10-CM | POA: Diagnosis not present

## 2018-05-23 DIAGNOSIS — N183 Chronic kidney disease, stage 3 (moderate): Secondary | ICD-10-CM | POA: Diagnosis present

## 2018-05-23 DIAGNOSIS — Z950 Presence of cardiac pacemaker: Secondary | ICD-10-CM | POA: Diagnosis not present

## 2018-05-23 DIAGNOSIS — I480 Paroxysmal atrial fibrillation: Secondary | ICD-10-CM | POA: Diagnosis not present

## 2018-05-23 DIAGNOSIS — Z23 Encounter for immunization: Secondary | ICD-10-CM | POA: Diagnosis not present

## 2018-05-23 DIAGNOSIS — I5021 Acute systolic (congestive) heart failure: Secondary | ICD-10-CM

## 2018-05-23 DIAGNOSIS — M810 Age-related osteoporosis without current pathological fracture: Secondary | ICD-10-CM | POA: Diagnosis present

## 2018-05-23 DIAGNOSIS — I110 Hypertensive heart disease with heart failure: Secondary | ICD-10-CM | POA: Diagnosis not present

## 2018-05-23 DIAGNOSIS — Z79899 Other long term (current) drug therapy: Secondary | ICD-10-CM

## 2018-05-23 DIAGNOSIS — Z85038 Personal history of other malignant neoplasm of large intestine: Secondary | ICD-10-CM

## 2018-05-23 DIAGNOSIS — I130 Hypertensive heart and chronic kidney disease with heart failure and stage 1 through stage 4 chronic kidney disease, or unspecified chronic kidney disease: Principal | ICD-10-CM | POA: Diagnosis present

## 2018-05-23 DIAGNOSIS — I2510 Atherosclerotic heart disease of native coronary artery without angina pectoris: Secondary | ICD-10-CM | POA: Diagnosis present

## 2018-05-23 DIAGNOSIS — E669 Obesity, unspecified: Secondary | ICD-10-CM | POA: Diagnosis present

## 2018-05-23 DIAGNOSIS — F32A Depression, unspecified: Secondary | ICD-10-CM | POA: Diagnosis present

## 2018-05-23 DIAGNOSIS — Z6838 Body mass index (BMI) 38.0-38.9, adult: Secondary | ICD-10-CM

## 2018-05-23 DIAGNOSIS — Z86010 Personal history of colonic polyps: Secondary | ICD-10-CM

## 2018-05-23 DIAGNOSIS — I5042 Chronic combined systolic (congestive) and diastolic (congestive) heart failure: Secondary | ICD-10-CM | POA: Insufficient documentation

## 2018-05-23 DIAGNOSIS — J440 Chronic obstructive pulmonary disease with acute lower respiratory infection: Secondary | ICD-10-CM | POA: Diagnosis present

## 2018-05-23 DIAGNOSIS — I509 Heart failure, unspecified: Secondary | ICD-10-CM | POA: Diagnosis not present

## 2018-05-23 DIAGNOSIS — J209 Acute bronchitis, unspecified: Secondary | ICD-10-CM

## 2018-05-23 DIAGNOSIS — D509 Iron deficiency anemia, unspecified: Secondary | ICD-10-CM | POA: Diagnosis present

## 2018-05-23 DIAGNOSIS — I5043 Acute on chronic combined systolic (congestive) and diastolic (congestive) heart failure: Secondary | ICD-10-CM | POA: Insufficient documentation

## 2018-05-23 DIAGNOSIS — Z91048 Other nonmedicinal substance allergy status: Secondary | ICD-10-CM

## 2018-05-23 DIAGNOSIS — E118 Type 2 diabetes mellitus with unspecified complications: Secondary | ICD-10-CM | POA: Diagnosis present

## 2018-05-23 DIAGNOSIS — Z6841 Body Mass Index (BMI) 40.0 and over, adult: Secondary | ICD-10-CM

## 2018-05-23 DIAGNOSIS — E11649 Type 2 diabetes mellitus with hypoglycemia without coma: Secondary | ICD-10-CM | POA: Diagnosis not present

## 2018-05-23 DIAGNOSIS — I482 Chronic atrial fibrillation, unspecified: Secondary | ICD-10-CM | POA: Diagnosis not present

## 2018-05-23 DIAGNOSIS — Z8371 Family history of colonic polyps: Secondary | ICD-10-CM

## 2018-05-23 DIAGNOSIS — Z91040 Latex allergy status: Secondary | ICD-10-CM

## 2018-05-23 DIAGNOSIS — Z833 Family history of diabetes mellitus: Secondary | ICD-10-CM

## 2018-05-23 DIAGNOSIS — I5023 Acute on chronic systolic (congestive) heart failure: Secondary | ICD-10-CM | POA: Diagnosis not present

## 2018-05-23 DIAGNOSIS — E785 Hyperlipidemia, unspecified: Secondary | ICD-10-CM | POA: Diagnosis present

## 2018-05-23 DIAGNOSIS — J441 Chronic obstructive pulmonary disease with (acute) exacerbation: Secondary | ICD-10-CM | POA: Diagnosis not present

## 2018-05-23 DIAGNOSIS — F329 Major depressive disorder, single episode, unspecified: Secondary | ICD-10-CM | POA: Diagnosis present

## 2018-05-23 DIAGNOSIS — Z888 Allergy status to other drugs, medicaments and biological substances status: Secondary | ICD-10-CM

## 2018-05-23 DIAGNOSIS — Z9884 Bariatric surgery status: Secondary | ICD-10-CM

## 2018-05-23 DIAGNOSIS — I5032 Chronic diastolic (congestive) heart failure: Secondary | ICD-10-CM | POA: Insufficient documentation

## 2018-05-23 DIAGNOSIS — Z9221 Personal history of antineoplastic chemotherapy: Secondary | ICD-10-CM

## 2018-05-23 DIAGNOSIS — E039 Hypothyroidism, unspecified: Secondary | ICD-10-CM | POA: Diagnosis present

## 2018-05-23 DIAGNOSIS — I10 Essential (primary) hypertension: Secondary | ICD-10-CM | POA: Diagnosis not present

## 2018-05-23 DIAGNOSIS — J9601 Acute respiratory failure with hypoxia: Secondary | ICD-10-CM | POA: Diagnosis not present

## 2018-05-23 DIAGNOSIS — I272 Pulmonary hypertension, unspecified: Secondary | ICD-10-CM | POA: Diagnosis present

## 2018-05-23 DIAGNOSIS — Z794 Long term (current) use of insulin: Secondary | ICD-10-CM

## 2018-05-23 DIAGNOSIS — Z79890 Hormone replacement therapy (postmenopausal): Secondary | ICD-10-CM

## 2018-05-23 DIAGNOSIS — M1990 Unspecified osteoarthritis, unspecified site: Secondary | ICD-10-CM | POA: Diagnosis present

## 2018-05-23 DIAGNOSIS — Z86718 Personal history of other venous thrombosis and embolism: Secondary | ICD-10-CM

## 2018-05-23 DIAGNOSIS — H409 Unspecified glaucoma: Secondary | ICD-10-CM | POA: Diagnosis present

## 2018-05-23 DIAGNOSIS — I25118 Atherosclerotic heart disease of native coronary artery with other forms of angina pectoris: Secondary | ICD-10-CM | POA: Diagnosis present

## 2018-05-23 DIAGNOSIS — Z7901 Long term (current) use of anticoagulants: Secondary | ICD-10-CM

## 2018-05-23 DIAGNOSIS — Z886 Allergy status to analgesic agent status: Secondary | ICD-10-CM

## 2018-05-23 DIAGNOSIS — Z8249 Family history of ischemic heart disease and other diseases of the circulatory system: Secondary | ICD-10-CM

## 2018-05-23 DIAGNOSIS — Z87891 Personal history of nicotine dependence: Secondary | ICD-10-CM

## 2018-05-23 DIAGNOSIS — I495 Sick sinus syndrome: Secondary | ICD-10-CM | POA: Diagnosis present

## 2018-05-23 HISTORY — DX: Chronic combined systolic (congestive) and diastolic (congestive) heart failure: I50.42

## 2018-05-23 HISTORY — DX: Paroxysmal atrial fibrillation: I48.0

## 2018-05-23 LAB — CBC
HEMATOCRIT: 30.6 % — AB (ref 36.0–46.0)
HEMOGLOBIN: 9.2 g/dL — AB (ref 12.0–15.0)
MCH: 24.8 pg — ABNORMAL LOW (ref 26.0–34.0)
MCHC: 30.1 g/dL (ref 30.0–36.0)
MCV: 82.5 fL (ref 78.0–100.0)
Platelets: 420 10*3/uL — ABNORMAL HIGH (ref 150–400)
RBC: 3.71 MIL/uL — AB (ref 3.87–5.11)
RDW: 15.9 % — AB (ref 11.5–15.5)
WBC: 12.8 10*3/uL — AB (ref 4.0–10.5)

## 2018-05-23 LAB — BASIC METABOLIC PANEL
ANION GAP: 13 (ref 5–15)
BUN: 27 mg/dL — ABNORMAL HIGH (ref 8–23)
CHLORIDE: 87 mmol/L — AB (ref 98–111)
CO2: 30 mmol/L (ref 22–32)
Calcium: 8.8 mg/dL — ABNORMAL LOW (ref 8.9–10.3)
Creatinine, Ser: 1.26 mg/dL — ABNORMAL HIGH (ref 0.44–1.00)
GFR, EST AFRICAN AMERICAN: 47 mL/min — AB (ref >60)
GFR, EST NON AFRICAN AMERICAN: 40 mL/min — AB (ref >60)
Glucose, Bld: 176 mg/dL — ABNORMAL HIGH (ref 70–99)
POTASSIUM: 4.3 mmol/L (ref 3.5–5.1)
SODIUM: 130 mmol/L — AB (ref 135–145)

## 2018-05-23 LAB — I-STAT TROPONIN, ED: Troponin i, poc: 0 ng/mL (ref 0.00–0.08)

## 2018-05-23 LAB — GLUCOSE, CAPILLARY: Glucose-Capillary: 131 mg/dL — ABNORMAL HIGH (ref 70–99)

## 2018-05-23 LAB — BRAIN NATRIURETIC PEPTIDE: B NATRIURETIC PEPTIDE 5: 196.3 pg/mL — AB (ref 0.0–100.0)

## 2018-05-23 MED ORDER — FUROSEMIDE 10 MG/ML IJ SOLN
80.0000 mg | Freq: Once | INTRAMUSCULAR | Status: AC
  Administered 2018-05-23: 80 mg via INTRAVENOUS
  Filled 2018-05-23: qty 8

## 2018-05-23 NOTE — Progress Notes (Signed)
PT. Arrived to the unit from ED in alert and stable condition. Pt. Oriented to room and placed on telemetry. CCMD notified of pts. Arrival to the unit. Call light placed within reach. Pts. Husband at bedside and is requesting food/coffee for the pt. Pt. Has no admitting/diet orders at this time. RN informed the husband that the on call was paged to make aware. RN currently awaiting orders. Will continue to monitor pt. For changes in condition. Agness Sibrian, Katherine Roan

## 2018-05-23 NOTE — Telephone Encounter (Signed)
Tried calling pt to get TCM appt schedule w/Dr. Sharlet Salina. No answer LMOM RTC (sent crm to pec for fyi).Marland KitchenJohny [name]

## 2018-05-23 NOTE — ED Triage Notes (Signed)
Pt reports she has had a nonproductive cough, sob, nausea, and weakness for the last couple of days. Denies pain. Lung sounds crackles and wheezing. HX of CHF. Pt was at [place] a few days ago for this same issue. Pt has a Corporate investment banker.

## 2018-05-23 NOTE — Telephone Encounter (Signed)
Spoke with husband and he was very concerned about wife's condition.  Pt went to [facility] on 9/22 and was admitted for SOB and Hypoxia, also had CHF issues.  Husband states one MD said they were going to keep pt and do IV Lasix and then another said pt had COPD and they would increase home diuretic therapy.  Today pt has been very weak, SOB, can't even hold a conversation due to weakness and getting winded so easily while speaking.  Husband states pt has bene coughing all day but nothing is coming up.  Swelling in bilateral legs.  Husband wants to take pt to [facility] if we feel appropriate.  Advised husband since pt is that winded and weak, taking her to [facility] ER was appropriate.  Pt is scheduled to see Dr. [name] 10/1.  Advised I will leave this appt and she can f/u if she is discharged.  Husband very appreciative for call.

## 2018-05-23 NOTE — ED Provider Notes (Signed)
[facility] EMERGENCY DEPARTMENT Provider Note   CSN: [id number] Arrival date & time: 05/23/18  1808     History   Chief Complaint Chief Complaint  Patient presents with  . Congestive Heart Failure    HPI Mary [name] is a 77 y.o. female.  77 year old female with history of CHF presents with increased dyspnea exertion as well as orthopnea and lower extremity edema.  Recently left the hospital yesterday AMA after she was admitted for same.  Which went home her symptoms became worse.  No associated chest pain or chest pressure.  Has had nonproductive cough without fever chills.  Took Lasix 80 mg today and has had some diuresis.  Due to her worsening dyspnea called her physician and was told to come here for further management.     Past Medical History:  Diagnosis Date  . Arthritis   . Atrial tachycardia ([place]) 04/23/2017  . CAD (coronary artery disease)    non obstructive  . 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 Procedure complicated by an atrial lead dislodgment and repeat procedure, 12/2013   . Carotid stenosis    Carotid US 5/16:  Bilateral ICA 1-39%; L vertebral retrograde; L BP 126/49, R BP 140/57  . CHF (congestive heart failure) ([place])   . Chronic lower back pain   . Colon cancer ([place])    a. s/p chemo  . Colon polyps   . Diabetic peripheral neuropathy ([place])   . DVT (deep venous thrombosis) ([place]) 2013   "twice behind knee on left side" (06/25/2013)  . Glaucoma   . Hyperlipidemia   . Hypertension   . Hypothyroidism   . Iron deficiency anemia   . Multinodular goiter   . Osteoporosis   . Sleep apnea    a. resolved post weight loss   . Type II diabetes mellitus [facility])     Patient Active Problem List   Diagnosis Date Noted  . Acute on chronic diastolic CHF (congestive heart failure) ([place]) 05/21/2018  . Acute respiratory failure with hypoxia ([place]) 05/21/2018  . Rash 12/09/2017  .  Osteoarthritis 05/25/2017  . Atrial tachycardia ([place]) 04/23/2017  . Second degree AV block, Mobitz type II   . Depression 08/02/2016  . Chronic diastolic heart failure ([place]) 08/02/2016  . DVT (deep vein thrombosis) in pregnancy ([place]) 08/02/2016  . Tobacco abuse 08/02/2016  . Right lumbar radiculitis 06/09/2016  . Tear of medial meniscus of right knee, current 04/22/2016  . Chronic venous insufficiency 02/21/2015  . 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 Atrioventricular block, complete ([place]) 01/02/2014  . PAF (paroxysmal atrial fibrillation) ([place]) 08/29/2013  . Obesity 05/05/2013  . CAD (coronary artery disease)   . Diabetes mellitus with complication ([place])   . Hypothyroidism   . Hypertension   . Colon cancer ([place])   . Hyperlipidemia     Past Surgical History:  Procedure Laterality Date  . CARDIAC CATHETERIZATION  2006   Never had PCI, 3 caths total. Last one in [place]  . CARDIAC CATHETERIZATION N/A 02/04/2015   Procedure: Left Heart Cath and Coronary Angiography;  Surgeon: Belva Crome, MD;  Location: [facility] CV LAB;  Service: Cardiovascular;  Laterality: N/A;  . CATARACT EXTRACTION, BILATERAL Bilateral 2013   "and put stent in my left eye for glaucoma" (06/25/2013)  . CHOLECYSTECTOMY  1980's  . COLECTOMY  10/2010   Tumor removal  .  EYE MUSCLE SURGERY Bilateral ~ 1963   "muscles too long; eyes would go out and up; tied muscles to hold my eyes straight" (06/25/2013)  . LEAD REVISION  01-03-2014   atrial lead revision by Dr Rayann Heman  . LEAD REVISION N/A 01/03/2014   Procedure: LEAD REVISION;  Surgeon: Coralyn Mark, MD;  Location: [facility] CATH LAB;  Service: Cardiovascular;  Laterality: N/A;  . PACEMAKER INSERTION  01/02/2014   STJ Assurity dual chamber pacemaker implnated by Dr Lovena Le for SSS  . PERMANENT PACEMAKER INSERTION N/A 01/02/2014   Procedure: PERMANENT PACEMAKER INSERTION;  Surgeon: Evans Lance, MD;  Location[facility] CATH LAB;   Service: Cardiovascular;  Laterality: N/A;  . PORTACATH PLACEMENT Left 11/2010  . ROUX-EN-Y GASTRIC BYPASS  2009  . ULNAR TUNNEL RELEASE Left ~ 2008  . UMBILICAL HERNIA REPAIR  1970's?    (06/25/2013)     OB History   None      Home Medications    Prior to Admission medications   Medication Sig Start Date End Date Taking? Authorizing Provider  atorvastatin (LIPITOR) 80 MG tablet TAKE 1 TABLET(80 MG) BY MOUTH DAILY 04/18/18   Hoyt Koch, MD  bimatoprost (LUMIGAN) 0.01 % SOLN Place 1 drop into the right eye at bedtime.     [provider]  buPROPion (WELLBUTRIN XL) 150 MG 24 hr tablet Take 1 tablet (150 mg total) by mouth daily. 05/12/18   Hoyt Koch, MD  ELIQUIS 5 MG TABS tablet TAKE 1 TABLET BY MOUTH TWICE A DAY 05/23/18   Allred, Jeneen Rinks, MD  flecainide (TAMBOCOR) 50 MG tablet Take 1.5 tablets (75 mg total) by mouth 2 (two) times daily. 05/10/18   Patsey Berthold, NP  furosemide (LASIX) 80 MG tablet Take 1 tablet (80 mg total) by mouth daily. 01/24/18   Hoyt Koch, MD  gabapentin (NEURONTIN) 300 MG capsule Take 300 mg by mouth at bedtime.     [provider]  HYDROcodone-acetaminophen (NORCO/VICODIN) 5-325 MG tablet Take 1 tablet by mouth every 4 (four) hours as needed for moderate pain. 03/21/18   Hoyt Koch, MD  Insulin Glargine (BASAGLAR KWIKPEN) 100 UNIT/ML SOPN Inject 64 Units into the skin daily. 01/09/18   [provider]  levothyroxine (SYNTHROID, LEVOTHROID) 125 MCG tablet Take 125 mcg by mouth daily before breakfast. 01/10/18   [provider]  metoprolol succinate (TOPROL XL) 25 MG 24 hr tablet Take 0.5 tablets (12.5 mg total) by mouth at bedtime. 05/10/18   Patsey Berthold, NP  nitroGLYCERIN (NITROSTAT) 0.4 MG SL tablet Place 1 tablet (0.4 mg total) under the tongue every 5 (five) minutes as needed for chest pain. 08/26/17   Belva Crome, MD  potassium chloride SA (K-DUR,KLOR-CON) 20 MEQ tablet TAKE 2  TABLETS BY MOUTH ONCE DAILY 08/15/17   Eileen Stanford, PA-C    Family History Family History  Problem Relation Age of Onset  . Breast cancer Mother   . Diabetes Mother   . Heart disease Father   . Heart attack Father   . Heart attack Sister   . Heart attack Brother   . Bladder Cancer Brother   . Prostate cancer Brother   . Colon polyps Brother   . Breast cancer Sister   . Breast cancer Maternal Aunt        x 2 aunts  . Pancreatic cancer Brother   . Colon polyps Brother   . Bladder Cancer Cousin   . Clotting disorder Sister   .  Diabetes Sister        x 3  . Diabetes Maternal Grandmother     Social History Social History   Tobacco Use  . Smoking status: Former Smoker    Packs/day: 1.00    Years: 30.00    Pack years: 30.00    Types: Cigarettes    Last attempt to quit: 02/2017    Years since quitting: 1.2  . Smokeless tobacco: Never Used  . Tobacco comment: 06/25/2013 "smoked off and on since I was 22; stopped 10/2007 til 08/30/2012 then started again cause of stress"  Substance Use Topics  . Alcohol use: Not Currently    Alcohol/week: 0.0 standard drinks  . Drug use: No     Allergies   Other; Adhesive [tape]; Aspirin; Latex; and Nsaids   Review of Systems Review of Systems  All other systems reviewed and are negative.    Physical Exam Updated Vital Signs BP (!) 144/69   Pulse 71   Temp 98.1 F (36.7 C) (Oral)   Resp (!) 21   Ht 1.702 m (5\' 7" )   Wt 123.8 kg   SpO2 98%   BMI 42.76 kg/m   Physical Exam  Constitutional: She is oriented to person, place, and time. She appears well-developed and well-nourished.  Non-toxic appearance. No distress.  HENT:  Head: Normocephalic and atraumatic.  Eyes: Pupils are equal, round, and reactive to light. Conjunctivae, EOM and lids are normal.  Neck: Normal range of motion. Neck supple. No tracheal deviation present. No thyroid mass present.  Cardiovascular: Normal rate, regular rhythm and normal heart  sounds. Exam reveals no gallop.  No murmur heard. Pulmonary/Chest: Effort normal. No stridor. Tachypnea noted. No respiratory distress. She has decreased breath sounds in the right lower field and the left lower field. She has no wheezes. She has no rhonchi. She has rales in the right lower field and the left lower field.  Abdominal: Soft. Normal appearance and bowel sounds are normal. She exhibits no distension. There is no tenderness. There is no rebound and no CVA tenderness.  Musculoskeletal: Normal range of motion. She exhibits no edema or tenderness.  Lymphadenopathy:  3+ bilateral lower extremity pitting edema  Neurological: She is alert and oriented to person, place, and time. She has normal strength. No cranial nerve deficit or sensory deficit. GCS eye subscore is 4. GCS verbal subscore is 5. GCS motor subscore is 6.  Skin: Skin is warm and dry. No abrasion and no rash noted.  Psychiatric: She has a normal mood and affect. Her speech is normal and behavior is normal.  Nursing note and vitals reviewed.    ED Treatments / Results  Labs (all labs ordered are listed, but only abnormal results are displayed) Labs Reviewed  BASIC METABOLIC PANEL - Abnormal; Notable for the following components:      Result Value   Sodium 130 (*)    Chloride 87 (*)    Glucose, Bld 176 (*)    BUN 27 (*)    Creatinine, Ser 1.26 (*)    Calcium 8.8 (*)    GFR calc non Af Amer 40 (*)    GFR calc Af Amer 47 (*)    All other components within normal limits  CBC - Abnormal; Notable for the following components:   WBC 12.8 (*)    RBC 3.71 (*)    Hemoglobin 9.2 (*)    HCT 30.6 (*)    MCH 24.8 (*)    RDW 15.9 (*)  Platelets 420 (*)    All other components within normal limits  BRAIN NATRIURETIC PEPTIDE  I-STAT TROPONIN, ED    EKG EKG Interpretation  Date/Time:  Tuesday May 23 2018 18:20:29 EDT Ventricular Rate:  76 PR Interval:  218 QRS Duration: 182 QT Interval:  500 QTC  Calculation: 562 R Axis:   -85 Text Interpretation:  AV dual-paced rhythm with prolonged AV conduction Abnormal ECG No significant change since last tracing Confirmed by Lacretia Leigh ([id number]) on 05/23/2018 9:40:42 PM   Radiology Dg Chest 2 View  Result Date: 05/23/2018 CLINICAL DATA:  Nausea, shortness of Breath EXAM: CHEST - 2 VIEW COMPARISON:  05/21/2018 FINDINGS: Cardiomegaly, vascular congestion and peribronchial thickening. Left pacer remains in place, unchanged. No confluent opacities or effusions. No acute bony abnormality. IMPRESSION: Cardiomegaly, vascular congestion. Mild bronchitic changes. Electronically Signed   By: Rolm Baptise M.D.   On: 05/23/2018 19:55    Procedures Procedures (including critical care time)  Medications Ordered in ED Medications  furosemide (LASIX) injection 80 mg (has no administration in time range)     Initial Impression / Assessment and Plan / ED Course  I have reviewed the triage vital signs and the nursing notes.  Pertinent labs & imaging results that were available during my care of the patient were reviewed by me and considered in my medical decision making (see chart for details).     Patient has evidence of CHF on her chest x-ray.  Given Lasix 80 mg IV push here.  Will admit to the hospitalist service  Final Clinical Impressions(s) / ED Diagnoses   Final diagnoses:  None    ED Discharge Orders    None       Lacretia Leigh, MD 05/23/18 2141

## 2018-05-23 NOTE — Telephone Encounter (Signed)
New Message   Pt's husband is calling, states he has questions about the pt's heart failure. Please call

## 2018-05-24 ENCOUNTER — Encounter (HOSPITAL_COMMUNITY): Payer: Self-pay | Admitting: Internal Medicine

## 2018-05-24 ENCOUNTER — Other Ambulatory Visit: Payer: Self-pay

## 2018-05-24 DIAGNOSIS — I5033 Acute on chronic diastolic (congestive) heart failure: Secondary | ICD-10-CM | POA: Diagnosis not present

## 2018-05-24 DIAGNOSIS — Z23 Encounter for immunization: Secondary | ICD-10-CM | POA: Diagnosis not present

## 2018-05-24 DIAGNOSIS — I130 Hypertensive heart and chronic kidney disease with heart failure and stage 1 through stage 4 chronic kidney disease, or unspecified chronic kidney disease: Secondary | ICD-10-CM | POA: Diagnosis present

## 2018-05-24 DIAGNOSIS — I5043 Acute on chronic combined systolic (congestive) and diastolic (congestive) heart failure: Secondary | ICD-10-CM | POA: Insufficient documentation

## 2018-05-24 DIAGNOSIS — E785 Hyperlipidemia, unspecified: Secondary | ICD-10-CM | POA: Diagnosis present

## 2018-05-24 DIAGNOSIS — M810 Age-related osteoporosis without current pathological fracture: Secondary | ICD-10-CM | POA: Diagnosis present

## 2018-05-24 DIAGNOSIS — I2510 Atherosclerotic heart disease of native coronary artery without angina pectoris: Secondary | ICD-10-CM | POA: Diagnosis not present

## 2018-05-24 DIAGNOSIS — I5042 Chronic combined systolic (congestive) and diastolic (congestive) heart failure: Secondary | ICD-10-CM | POA: Insufficient documentation

## 2018-05-24 DIAGNOSIS — J440 Chronic obstructive pulmonary disease with acute lower respiratory infection: Secondary | ICD-10-CM | POA: Diagnosis present

## 2018-05-24 DIAGNOSIS — F329 Major depressive disorder, single episode, unspecified: Secondary | ICD-10-CM | POA: Diagnosis present

## 2018-05-24 DIAGNOSIS — Z950 Presence of cardiac pacemaker: Secondary | ICD-10-CM | POA: Diagnosis not present

## 2018-05-24 DIAGNOSIS — E11649 Type 2 diabetes mellitus with hypoglycemia without coma: Secondary | ICD-10-CM | POA: Diagnosis not present

## 2018-05-24 DIAGNOSIS — I480 Paroxysmal atrial fibrillation: Secondary | ICD-10-CM | POA: Diagnosis present

## 2018-05-24 DIAGNOSIS — E039 Hypothyroidism, unspecified: Secondary | ICD-10-CM | POA: Diagnosis present

## 2018-05-24 DIAGNOSIS — D509 Iron deficiency anemia, unspecified: Secondary | ICD-10-CM | POA: Diagnosis present

## 2018-05-24 DIAGNOSIS — J9601 Acute respiratory failure with hypoxia: Secondary | ICD-10-CM | POA: Diagnosis present

## 2018-05-24 DIAGNOSIS — I5023 Acute on chronic systolic (congestive) heart failure: Secondary | ICD-10-CM

## 2018-05-24 DIAGNOSIS — I482 Chronic atrial fibrillation, unspecified: Secondary | ICD-10-CM | POA: Diagnosis present

## 2018-05-24 DIAGNOSIS — I5021 Acute systolic (congestive) heart failure: Secondary | ICD-10-CM | POA: Diagnosis not present

## 2018-05-24 DIAGNOSIS — J209 Acute bronchitis, unspecified: Secondary | ICD-10-CM | POA: Diagnosis present

## 2018-05-24 DIAGNOSIS — J441 Chronic obstructive pulmonary disease with (acute) exacerbation: Secondary | ICD-10-CM | POA: Diagnosis not present

## 2018-05-24 DIAGNOSIS — K5900 Constipation, unspecified: Secondary | ICD-10-CM | POA: Diagnosis not present

## 2018-05-24 DIAGNOSIS — H409 Unspecified glaucoma: Secondary | ICD-10-CM | POA: Diagnosis present

## 2018-05-24 DIAGNOSIS — N183 Chronic kidney disease, stage 3 (moderate): Secondary | ICD-10-CM | POA: Diagnosis not present

## 2018-05-24 DIAGNOSIS — I495 Sick sinus syndrome: Secondary | ICD-10-CM | POA: Diagnosis present

## 2018-05-24 DIAGNOSIS — J449 Chronic obstructive pulmonary disease, unspecified: Secondary | ICD-10-CM | POA: Insufficient documentation

## 2018-05-24 DIAGNOSIS — I272 Pulmonary hypertension, unspecified: Secondary | ICD-10-CM | POA: Diagnosis present

## 2018-05-24 DIAGNOSIS — Z6841 Body Mass Index (BMI) 40.0 and over, adult: Secondary | ICD-10-CM | POA: Diagnosis not present

## 2018-05-24 DIAGNOSIS — I5032 Chronic diastolic (congestive) heart failure: Secondary | ICD-10-CM | POA: Insufficient documentation

## 2018-05-24 DIAGNOSIS — R0600 Dyspnea, unspecified: Secondary | ICD-10-CM | POA: Diagnosis not present

## 2018-05-24 DIAGNOSIS — M1990 Unspecified osteoarthritis, unspecified site: Secondary | ICD-10-CM | POA: Diagnosis present

## 2018-05-24 LAB — GLUCOSE, CAPILLARY
GLUCOSE-CAPILLARY: 374 mg/dL — AB (ref 70–99)
GLUCOSE-CAPILLARY: 279 mg/dL — AB (ref 70–99)
GLUCOSE-CAPILLARY: 227 mg/dL — AB (ref 70–99)
Glucose-Capillary: 107 mg/dL — ABNORMAL HIGH (ref 70–99)
Glucose-Capillary: 232 mg/dL — ABNORMAL HIGH (ref 70–99)
Glucose-Capillary: 128 mg/dL — ABNORMAL HIGH (ref 70–99)

## 2018-05-24 LAB — BASIC METABOLIC PANEL
ANION GAP: 12 (ref 5–15)
BUN: 24 mg/dL — ABNORMAL HIGH (ref 8–23)
CO2: 32 mmol/L (ref 22–32)
Calcium: 8.9 mg/dL (ref 8.9–10.3)
Chloride: 90 mmol/L — ABNORMAL LOW (ref 98–111)
Creatinine, Ser: 1.16 mg/dL — ABNORMAL HIGH (ref 0.44–1.00)
GFR calc non Af Amer: 45 mL/min — ABNORMAL LOW (ref >60)
GFR, EST AFRICAN AMERICAN: 52 mL/min — AB (ref >60)
GLUCOSE: 104 mg/dL — AB (ref 70–99)
POTASSIUM: 3.5 mmol/L (ref 3.5–5.1)
Sodium: 134 mmol/L — ABNORMAL LOW (ref 135–145)

## 2018-05-24 LAB — HEMOGLOBIN A1C
Hgb A1c MFr Bld: 9.9 % — ABNORMAL HIGH (ref 4.8–5.6)
Mean Plasma Glucose: 237.43 mg/dL

## 2018-05-24 LAB — CBC
HEMATOCRIT: 30.7 % — AB (ref 36.0–46.0)
Hemoglobin: 9.3 g/dL — ABNORMAL LOW (ref 12.0–15.0)
MCH: 24.7 pg — ABNORMAL LOW (ref 26.0–34.0)
MCHC: 30.3 g/dL (ref 30.0–36.0)
MCV: 81.6 fL (ref 78.0–100.0)
Platelets: 433 10*3/uL — ABNORMAL HIGH (ref 150–400)
RBC: 3.76 MIL/uL — AB (ref 3.87–5.11)
RDW: 15.8 % — ABNORMAL HIGH (ref 11.5–15.5)
WBC: 9.8 10*3/uL (ref 4.0–10.5)

## 2018-05-24 MED ORDER — SODIUM CHLORIDE 0.9% FLUSH
3.0000 mL | Freq: Two times a day (BID) | INTRAVENOUS | Status: DC
  Administered 2018-05-24 – 2018-05-27 (×8): 3 mL via INTRAVENOUS

## 2018-05-24 MED ORDER — FUROSEMIDE 10 MG/ML IJ SOLN
40.0000 mg | Freq: Two times a day (BID) | INTRAMUSCULAR | Status: DC
  Administered 2018-05-24 – 2018-05-27 (×8): 40 mg via INTRAVENOUS
  Filled 2018-05-24 (×8): qty 4

## 2018-05-24 MED ORDER — ARFORMOTEROL TARTRATE 15 MCG/2ML IN NEBU
15.0000 ug | INHALATION_SOLUTION | Freq: Two times a day (BID) | RESPIRATORY_TRACT | Status: DC
  Administered 2018-05-24 – 2018-05-28 (×8): 15 ug via RESPIRATORY_TRACT
  Filled 2018-05-24 (×9): qty 2

## 2018-05-24 MED ORDER — SODIUM CHLORIDE 0.9 % IV SOLN: 250.0000 mL | INTRAVENOUS | Status: DC | PRN

## 2018-05-24 MED ORDER — METOPROLOL SUCCINATE ER 25 MG PO TB24
12.5000 mg | ORAL_TABLET | Freq: Every day | ORAL | Status: DC
  Administered 2018-05-24 – 2018-05-29 (×7): 12.5 mg via ORAL
  Filled 2018-05-24 (×7): qty 1

## 2018-05-24 MED ORDER — NITROGLYCERIN 0.4 MG SL SUBL: 0.4000 mg | SUBLINGUAL_TABLET | SUBLINGUAL | Status: DC | PRN

## 2018-05-24 MED ORDER — FLECAINIDE ACETATE 50 MG PO TABS
75.0000 mg | ORAL_TABLET | Freq: Two times a day (BID) | ORAL | Status: DC
  Administered 2018-05-24 – 2018-05-25 (×3): 75 mg via ORAL
  Filled 2018-05-24 (×3): qty 2

## 2018-05-24 MED ORDER — LEVOTHYROXINE SODIUM 25 MCG PO TABS
125.0000 ug | ORAL_TABLET | Freq: Every day | ORAL | Status: DC
  Administered 2018-05-24 – 2018-05-30 (×7): 125 ug via ORAL
  Filled 2018-05-24 (×8): qty 1

## 2018-05-24 MED ORDER — FUROSEMIDE 80 MG PO TABS
80.0000 mg | ORAL_TABLET | Freq: Every day | ORAL | Status: DC
  Filled 2018-05-24: qty 1

## 2018-05-24 MED ORDER — APIXABAN 5 MG PO TABS
5.0000 mg | ORAL_TABLET | Freq: Two times a day (BID) | ORAL | Status: DC
  Administered 2018-05-24 – 2018-05-25 (×4): 5 mg via ORAL
  Filled 2018-05-24 (×5): qty 1

## 2018-05-24 MED ORDER — BUPROPION HCL ER (XL) 150 MG PO TB24
150.0000 mg | ORAL_TABLET | Freq: Every day | ORAL | Status: DC
  Administered 2018-05-24 – 2018-05-30 (×7): 150 mg via ORAL
  Filled 2018-05-24 (×7): qty 1

## 2018-05-24 MED ORDER — ACETAMINOPHEN 325 MG PO TABS: 650.0000 mg | ORAL_TABLET | ORAL | Status: DC | PRN

## 2018-05-24 MED ORDER — DOXYCYCLINE HYCLATE 100 MG PO TABS
100.0000 mg | ORAL_TABLET | Freq: Two times a day (BID) | ORAL | Status: AC
  Administered 2018-05-24 – 2018-05-28 (×10): 100 mg via ORAL
  Filled 2018-05-24 (×10): qty 1

## 2018-05-24 MED ORDER — LATANOPROST 0.005 % OP SOLN
1.0000 [drp] | Freq: Every day | OPHTHALMIC | Status: DC
  Administered 2018-05-24 – 2018-05-29 (×7): 1 [drp] via OPHTHALMIC
  Filled 2018-05-24: qty 2.5

## 2018-05-24 MED ORDER — INSULIN ASPART 100 UNIT/ML ~~LOC~~ SOLN
0.0000 [IU] | Freq: Every day | SUBCUTANEOUS | Status: DC
  Administered 2018-05-25 – 2018-05-27 (×2): 2 [IU] via SUBCUTANEOUS

## 2018-05-24 MED ORDER — GABAPENTIN 300 MG PO CAPS
300.0000 mg | ORAL_CAPSULE | Freq: Every day | ORAL | Status: DC
  Administered 2018-05-24 – 2018-05-29 (×7): 300 mg via ORAL
  Filled 2018-05-24 (×7): qty 1

## 2018-05-24 MED ORDER — IPRATROPIUM-ALBUTEROL 0.5-2.5 (3) MG/3ML IN SOLN
3.0000 mL | Freq: Four times a day (QID) | RESPIRATORY_TRACT | Status: DC
  Administered 2018-05-24 – 2018-05-25 (×6): 3 mL via RESPIRATORY_TRACT
  Filled 2018-05-24 (×6): qty 3

## 2018-05-24 MED ORDER — POTASSIUM CHLORIDE CRYS ER 20 MEQ PO TBCR
40.0000 meq | EXTENDED_RELEASE_TABLET | Freq: Every day | ORAL | Status: DC
  Administered 2018-05-24 – 2018-05-27 (×4): 40 meq via ORAL
  Filled 2018-05-24 (×4): qty 2

## 2018-05-24 MED ORDER — INSULIN ASPART 100 UNIT/ML ~~LOC~~ SOLN
0.0000 [IU] | Freq: Three times a day (TID) | SUBCUTANEOUS | Status: DC
  Administered 2018-05-24: 7 [IU] via SUBCUTANEOUS
  Administered 2018-05-24: 11 [IU] via SUBCUTANEOUS
  Administered 2018-05-25: 4 [IU] via SUBCUTANEOUS
  Administered 2018-05-25 – 2018-05-26 (×2): 7 [IU] via SUBCUTANEOUS
  Administered 2018-05-26 – 2018-05-27 (×3): 3 [IU] via SUBCUTANEOUS
  Administered 2018-05-27: 7 [IU] via SUBCUTANEOUS
  Administered 2018-05-28: 4 [IU] via SUBCUTANEOUS
  Administered 2018-05-28: 7 [IU] via SUBCUTANEOUS
  Administered 2018-05-28: 11 [IU] via SUBCUTANEOUS
  Administered 2018-05-29: 4 [IU] via SUBCUTANEOUS
  Administered 2018-05-30 (×2): 3 [IU] via SUBCUTANEOUS

## 2018-05-24 MED ORDER — HYDROCODONE-ACETAMINOPHEN 5-325 MG PO TABS
1.0000 | ORAL_TABLET | ORAL | Status: DC | PRN
  Administered 2018-05-27: 1 via ORAL
  Filled 2018-05-24 (×2): qty 1

## 2018-05-24 MED ORDER — SODIUM CHLORIDE 0.9% FLUSH: 3.0000 mL | INTRAVENOUS | Status: DC | PRN

## 2018-05-24 MED ORDER — PREDNISONE 20 MG PO TABS
20.0000 mg | ORAL_TABLET | Freq: Every day | ORAL | Status: AC
  Administered 2018-05-24 – 2018-05-28 (×5): 20 mg via ORAL
  Filled 2018-05-24 (×6): qty 1

## 2018-05-24 MED ORDER — INSULIN GLARGINE 100 UNIT/ML ~~LOC~~ SOLN
64.0000 [IU] | Freq: Every day | SUBCUTANEOUS | Status: DC
  Administered 2018-05-24 – 2018-05-25 (×2): 64 [IU] via SUBCUTANEOUS
  Filled 2018-05-24 (×2): qty 0.64

## 2018-05-24 MED ORDER — ATORVASTATIN CALCIUM 80 MG PO TABS
80.0000 mg | ORAL_TABLET | Freq: Every day | ORAL | Status: DC
  Administered 2018-05-24 – 2018-05-30 (×7): 80 mg via ORAL
  Filled 2018-05-24 (×7): qty 1

## 2018-05-24 MED ORDER — INFLUENZA VAC SPLIT HIGH-DOSE 0.5 ML IM SUSY
0.5000 mL | PREFILLED_SYRINGE | INTRAMUSCULAR | Status: AC
  Administered 2018-05-25: 0.5 mL via INTRAMUSCULAR
  Filled 2018-05-24: qty 0.5

## 2018-05-24 MED ORDER — ONDANSETRON HCL 4 MG/2ML IJ SOLN: 4.0000 mg | Freq: Four times a day (QID) | INTRAMUSCULAR | Status: DC | PRN

## 2018-05-24 NOTE — Progress Notes (Signed)
Nutrition Brief Note  Patient seen today for consult COPD protocol.  Filed Weights   05/23/18 2305 05/24/18 0222 05/24/18 0431  Weight: 123.6 kg 123.6 kg 122.1 kg    Mary [name] is a 77 yo female with PMH of CHF, DM, HTN, colon cancer, roux en Y gastric bypass, obesity admitted for acute on chronic systolic CHF.    Upon visitation with [name] she reports good appetite PTA. She has a hx of roux-en-y gastric bypass and has since been careful about portion sizes and consuming more fresh fruits and vegetables. She is not a big meat eater. She reports keeping up with insulin and blood sugar checks at home; checks morning weight daily. Per patient she was taken off of metformin ~6 months ago due to worsening renal function. She reports occasional constipation due to being too weak to get up to use the bathroom.   [name] says she regularly craves and chews ice (pica). In her physical exam she was found to have spoon-shaped nails. Hx of gastric bypass as well as her low meat intake may put her at risk for iron deficiency. She does not take a multivitamin at home. She used to receive B-12 shots but has not had one in years. Unsure of her B-12 levels.   Due her previous gastric bypass it is recommended to take a daily multivitamin. However, due to her worsening renal function I am hesitant to recommend a general multivitamin to her. I recommended she consult with primary care provider who monitors her renal function to oversee her vitamin and mineral supplement intake.   Body mass index is 42.16 kg/m. This patient meets criteria for morbid obesity based on BMI.   Current diet order is Heart Healthy/Carb Modified with 1.5 L fluid restriction. Patient is eating 100% of meals per flowsheet. No nutrition interventions are warranted at this time. Please re-consult RD if nutrition issues arise. Thank you.   [place], Dietetic Intern [phone]

## 2018-05-24 NOTE — Evaluation (Signed)
Occupational Therapy Evaluation Patient Details Name: Mary [name] MRN: [id number] DOB: 1940/10/12 Today's Date: 05/24/2018    History of Present Illness 77 y.o.femalewith medical history significant ofA. fib, CHF, type 2 diabetes, presents with shortness of breath. Patient was admitted a few days ago to [facility] for CHF exacerbation. She was upset because she was unable to see cardiology there and left AMA yesterday. Returned with cough and SOB. X-ray showed some vascular congestion.    Clinical Impression   PTA, pt was living with her husband and was independent with ADLs, light IADLs, and enjoys painting. Currently, pt requires Min Guard A for  LB ADLs and functional mobility using a SPC. Pt presenting with decreased activity tolerance as seen by fatigue, dyspnea, and decreased SpO2. Pt SpO2 dropping to 87% on RA; placing pt on 2L and she returned to 90s. Per pt, she does not wear home O2. Pt would benefit from further acute OT to address energy conservation and facilitate safe dc. Recommend dc to home once medically stable per physician.     Follow Up Recommendations  No OT follow up;Supervision/Assistance - 24 hour    Equipment Recommendations  None recommended by OT    Recommendations for Other Services       Precautions / Restrictions Precautions Precautions: Other (comment) Precaution Comments: Watch O2 Restrictions Weight Bearing Restrictions: No      Mobility Bed Mobility Overal bed mobility: Modified Independent             General bed mobility comments: Increased time. HOB elevated  Transfers Overall transfer level: Needs assistance Equipment used: Straight cane Transfers: Sit to/from Stand Sit to Stand: Min guard         General transfer comment: Min Guard A for safety    Balance Overall balance assessment: Mild deficits observed, not formally tested                                         ADL either performed or assessed  with clinical judgement   ADL Overall ADL's : Needs assistance/impaired Eating/Feeding: Set up;Supervision/ safety;Sitting   Grooming: Set up;Supervision/safety;Standing   Upper Body Bathing: Set up;Supervision/ safety;Sitting   Lower Body Bathing: Min guard;Sit to/from stand   Upper Body Dressing : Set up;Supervision/safety;Sitting   Lower Body Dressing: Min guard;Sit to/from stand Lower Body Dressing Details (indicate cue type and reason): Adjusts socks at EOB Toilet Transfer: Ambulation;Min guard(SPC) Toilet Transfer Details (indicate cue type and reason): Simulated to recliner. Min Guard A for safety         Functional mobility during ADLs: Min guard;Cane General ADL Comments: Pt near baseline funciton. Decreased activity tolerance as seen by fatigue, SOB, and decreased SpO2 on RA. Pt dropping to 87% on RA. Placed pt on 2L and returned to 90s.      Vision         Perception     Praxis      Pertinent Vitals/Pain Pain Assessment: Faces Faces Pain Scale: Hurts a little bit Pain Location: with cough Pain Descriptors / Indicators: Grimacing Pain Intervention(s): Monitored during session;Repositioned     Hand Dominance Right   Extremity/Trunk Assessment Upper Extremity Assessment Upper Extremity Assessment: Overall WFL for tasks assessed   Lower Extremity Assessment Lower Extremity Assessment: Defer to PT evaluation   Cervical / Trunk Assessment Cervical / Trunk Assessment: Other exceptions Cervical / Trunk Exceptions: Increased body habitus  Communication Communication Communication: No difficulties   Cognition Arousal/Alertness: Awake/alert Behavior During Therapy: WFL for tasks assessed/performed Overall Cognitive Status: Within Functional Limits for tasks assessed                                 General Comments: Very pleasant   General Comments  Husband present at end of session    Exercises     Shoulder Instructions      Home  Living Family/patient expects to be discharged to:: Private residence Living Arrangements: Spouse/significant other Available Help at Discharge: Family;Available 24 hours/day Type of Home: Apartment(First floor) Home Access: Stairs to enter Entrance Stairs-Number of Steps: 1 step threshold Entrance Stairs-Rails: None Home Layout: One level     Bathroom Shower/Tub: Tub/shower unit;Curtain   Bathroom Toilet: Handicapped height     Home Equipment: Cane - single point;Shower seat;Walker - 2 wheels          Prior Functioning/Environment Level of Independence: Independent with assistive device(s)        Comments: Uses a SPC for functional mobility; RW for in community. Independent in ADLs and light IADLs. Husband typically drives. Enjoys making jewery, painting, and listening to music.        OT Problem List: Decreased range of motion;Decreased activity tolerance;Impaired balance (sitting and/or standing);Decreased knowledge of use of DME or AE;Decreased knowledge of precautions;Cardiopulmonary status limiting activity;Increased edema      OT Treatment/Interventions: Therapeutic exercise;Self-care/ADL training;Energy conservation;DME and/or AE instruction;Therapeutic activities;Patient/family education    OT Goals(Current goals can be found in the care plan section) Acute Rehab OT Goals Patient Stated Goal: Feel back to normal OT Goal Formulation: With patient Time For Goal Achievement: 06/07/18 Potential to Achieve Goals: Good  OT Frequency: Min 2X/week   Barriers to D/C:            Co-evaluation              AM-PAC PT "6 Clicks" Daily Activity     Outcome Measure Help from another person eating meals?: None Help from another person taking care of personal grooming?: A Little Help from another person toileting, which includes using toliet, bedpan, or urinal?: A Little Help from another person bathing (including washing, rinsing, drying)?: A Little Help from  another person to put on and taking off regular upper body clothing?: None Help from another person to put on and taking off regular lower body clothing?: A Little 6 Click Score: 20   End of Session Equipment Utilized During Treatment: Other (comment);Oxygen(SPC) Nurse Communication: Mobility status;Other (comment)(SpO2)  Activity Tolerance: Patient tolerated treatment well Patient left: in chair;with call bell/phone within reach;with family/visitor present(respiratory)  OT Visit Diagnosis: Unsteadiness on feet (R26.81);Other abnormalities of gait and mobility (R26.89);Muscle weakness (generalized) (M62.81)                Time: 9798-9211 OT Time Calculation (min): 22 min Charges:  OT General Charges $OT Visit: 1 Visit OT Evaluation $OT Eval Low Complexity: [place], OTR/L Acute Rehab Pager: [phone] Office: [place] 05/24/2018, 3:27 PM

## 2018-05-24 NOTE — Telephone Encounter (Signed)
Per chart pt is still admitted for observation will contact once she has been discharge.Marland KitchenJohny Chess

## 2018-05-24 NOTE — Progress Notes (Addendum)
PROGRESS NOTE    Mary [name]  TIR:[id number] DOB: 1941-01-19 DOA: 05/23/2018 PCP: Hoyt Koch, MD  Brief Narrative:Mary [name] is a 77 y.o. female with medical history significant of A. fib, CHF, type 2 diabetes, presents with shortness of breath.  Patient was admitted a few days ago to [facility] for CHF exacerbation.  She was upset because she was unable to see cardiology there and left AMA yesterday.  Today she continued to have orthopnea, slight cough and shortness of breath.  Patient had a chest x-ray did show some vascular congestion .she was given 80 mg of IV Lasix in ED.  She had urine output x1.  Patient denies any known history of COPD but has a long history of tobacco use.  sHe initially became ill with cold-like symptoms 2 weeks ago.  She got better for short time but cough and orthopnea resumed .  Her lower extremities have been increasing in its swelling over the past week.  States she is compliant with her medications and follows with a local cardiologist here.  Patient denies any chest pain.     ED Course: Patient had a chest x-ray which showed show some vascular  Congestion.she was given 80 mg of IV Lasix in ED.    Assessment & Plan:   Principal Problem:   Acute on chronic systolic CHF (congestive heart failure) (HCC) Active Problems:   Obesity   Diabetes mellitus with complication (HCC)   Hypertension   PAF (paroxysmal atrial fibrillation) (HCC)   Cardiac pacemaker in situ - [employer]   Depression   COPD with acute bronchitis (HCC)  1-Acute on chronic systolic Heart failure exacerbation;  Received 80 mg IV lasix in the ED. Chest x ray cardiomegaly, vascular congestion.  Takes 80 mg oral lasix at home.  Will start 40 mg IV lasix BID.  New oxygen requirement.  Weight; 273---269 Cardiology consulted, patient request.   2-Acute Bronchitis; COPD exacerbation  Used to smoke.  Bilateral ronchus.  Schedule nebulizer.  Needs PFT outpatient.  Continue  with prednisone.  Doxy 5 days.   3-Acute hypoxic respiratory failure;  In setting on HF exacerbation and COPD.  IV lasix. Nebulizer.  New oxygen requirement, currently on 3 L oxygen.   4-A fib; chronic;  On flecainide and eliquis. Continue.   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  Check iron level.   6-CKD stage III Baseline 1.1-1.2  Monitor on lasix.   DM; continue with lantus. 64 units.   DVT prophylaxis: Eliquis Code Status: full code.  Family Communication: care discussed with patient.  Disposition Plan: Remain in patient for IV lasix. New oxygen requirement.   Consultants:   Cardiology    Procedures:   none   Antimicrobials: doxy    Subjective: She report SOB, at rest and on exertion.  She was having wheezing  denies chest pain.    Objective: Vitals:   05/24/18 0811 05/24/18 0908 05/24/18 1005 05/24/18 1240  BP: 123/64  (!) 145/48 118/66  Pulse: 76  81 69  Resp: 18   18  Temp: 98.4 F (36.9 C)   98.5 F (36.9 C)  TempSrc: Oral   Oral  SpO2: 95% 98% 98% 94%  Weight:      Height:        Intake/Output Summary (Last 24 hours) at 05/24/2018 1440 Last data filed at 05/24/2018 1400 Gross per 24 hour  Intake 1376 ml  Output 4000 ml  Net -4010 ml   Filed Weights   05/23/18 2305 05/24/18 0222 05/24/18 0431  Weight: 123.6 kg 123.6 kg 122.1 kg    Examination:  General exam: Appears calm and comfortable  Respiratory system; bilateral ronchus.  Cardiovascular system: S1 & S2 heard, RRR. No JVD, murmurs, rubs, gallops or clicks. No pedal edema. Gastrointestinal system: Abdomen is nondistended, soft and nontender. No organomegaly or masses felt. Normal bowel sounds heard. Central nervous system: Alert and oriented. No focal neurological deficits. Extremities: Symmetric 5 x 5 power. Skin: No rashes, lesions or ulcers Psychiatry: Judgement and insight appear normal. Mood & affect appropriate.     Data  Reviewed: I have personally reviewed following labs and imaging studies  CBC: Recent Labs  Lab 05/21/18 1813 05/23/18 1840 05/24/18 0815  WBC 9.6 12.8* 9.8  NEUTROABS 6.6  --   --   HGB 9.8* 9.2* 9.3*  HCT 31.8* 30.6* 30.7*  MCV 80.3 82.5 81.6  PLT 445* 420* 272*   Basic Metabolic Panel: Recent Labs  Lab 05/21/18 1813 05/22/18 0535 05/23/18 1840 05/24/18 0815  NA 132* 128* 130* 134*  K 3.3* 4.7 4.3 3.5  CL 87* 87* 87* 90*  CO2 31 31 30  32  GLUCOSE 101* 378* 176* 104*  BUN 19 23 27* 24*  CREATININE 1.08* 1.23* 1.26* 1.16*  CALCIUM 9.2 8.5* 8.8* 8.9   GFR: Estimated Creatinine Clearance: 55.9 mL/min (A) (by C-G formula based on SCr of 1.16 mg/dL (H)). Liver Function Tests: No results for input(s): AST, ALT, ALKPHOS, BILITOT, PROT, ALBUMIN in the last 168 hours. No results for input(s): LIPASE, AMYLASE in the last 168 hours. No results for input(s): AMMONIA in the last 168 hours. Coagulation Profile: No results for input(s): INR, PROTIME in the last 168 hours. Cardiac Enzymes: No results for input(s): CKTOTAL, CKMB, CKMBINDEX, TROPONINI in the last 168 hours. BNP (last 3 results) No results for input(s): PROBNP in the last 8760 hours. HbA1C: Recent Labs    05/24/18 0231  HGBA1C 9.9*   CBG: Recent Labs  Lab 05/22/18 1203 05/23/18 2258 05/24/18 0812 05/24/18 1156  GLUCAP 252* 131* 107* 279*   Lipid Profile: No results for input(s): CHOL, HDL, LDLCALC, TRIG, CHOLHDL, LDLDIRECT in the last 72 hours. Thyroid Function Tests: No results for input(s): TSH, T4TOTAL, FREET4, T3FREE, THYROIDAB in the last 72 hours. Anemia Panel: No results for input(s): VITAMINB12, FOLATE, FERRITIN, TIBC, IRON, RETICCTPCT in the last 72 hours. Sepsis Labs: Recent Labs  Lab 05/22/18 1512  PROCALCITON <0.10    No results found for this or any previous visit (from the past 240 hour(s)).       Radiology Studies: Dg Chest 2 View  Result Date: 05/23/2018 CLINICAL DATA:   Nausea, shortness of Breath EXAM: CHEST - 2 VIEW COMPARISON:  05/21/2018 FINDINGS: Cardiomegaly, vascular congestion and peribronchial thickening. Left pacer remains in place, unchanged. No confluent opacities or effusions. No acute bony abnormality. IMPRESSION: Cardiomegaly, vascular congestion. Mild bronchitic changes. Electronically Signed   By: Rolm Baptise M.D.   On: 05/23/2018 19:55        Scheduled Meds: . apixaban  5 mg Oral BID  . arformoterol  15 mcg Nebulization BID  . atorvastatin  80 mg Oral q1800  . buPROPion  150 mg Oral Daily  . doxycycline  100 mg Oral Q12H  . flecainide  75 mg Oral BID  . furosemide  40 mg Intravenous Q12H  . gabapentin  300 mg Oral QHS  . [START ON 05/25/2018] Influenza vac  split quadrivalent PF  0.5 mL Intramuscular Tomorrow-1000  . insulin aspart  0-20 Units Subcutaneous TID WC  . insulin aspart  0-5 Units Subcutaneous QHS  . insulin glargine  64 Units Subcutaneous Daily  . ipratropium-albuterol  3 mL Nebulization Q6H  . latanoprost  1 drop Right Eye QHS  . levothyroxine  125 mcg Oral QAC breakfast  . metoprolol succinate  12.5 mg Oral QHS  . potassium chloride SA  40 mEq Oral Daily  . predniSONE  20 mg Oral Q breakfast  . sodium chloride flush  3 mL Intravenous Q12H   Continuous Infusions: . sodium chloride       LOS: 0 days    Time spent: 35 minutes.     Elmarie Shiley, MD [facility] Pager [phone]  If 7PM-7AM, please contact night-coverage [email or web address] Password [email or web address] 05/24/2018, 2:40 PM

## 2018-05-24 NOTE — Evaluation (Signed)
Physical Therapy Evaluation Patient Details Name: Mary [name] MRN: [id number] DOB: Feb 16, 1941 Today's Date: 05/24/2018   History of Present Illness  77 y.o.femalewith medical history significant ofA. fib, CHF, type 2 diabetes, presents with shortness of breath. Patient was admitted a few days ago to [facility] for CHF exacerbation. She was upset because she was unable to see cardiology there and left AMA yesterday. Returned with cough and SOB. X-ray showed some vascular congestion.     Clinical Impression  Pt admitted with above diagnosis. Pt currently with functional limitations due to the deficits listed below (see PT Problem List). PTA pt living in garden level apartment with husband, intermittent use of RW or SPC for community ambulation, does not wear home O2. Pt supervision level for mobility today from chair, ambulating short distances in hallway DOE 1/4 with activity, slight desat into high 80s on RA, returns to 90s with rest break. May need walking O2 performed prior to d/c.  Pt will benefit from skilled PT to increase their independence and safety with mobility to allow discharge to the venue listed below.       Follow Up Recommendations Home health PT;Supervision for mobility/OOB    Equipment Recommendations  None recommended by PT    Recommendations for Other Services       Precautions / Restrictions Precautions Precautions: Other (comment) Precaution Comments: Watch O2 Restrictions Weight Bearing Restrictions: No      Mobility  Bed Mobility Overal bed mobility: Modified Independent             General bed mobility comments: In chair at entry  Transfers Overall transfer level: Needs assistance Equipment used: Rolling walker (2 wheeled) Transfers: Sit to/from Stand Sit to Stand: Min guard         General transfer comment: cues for hand placement  Ambulation/Gait Ambulation/Gait assistance: Supervision Gait Distance (Feet): 75 Feet Assistive  device: Rolling walker (2 wheeled) Gait Pattern/deviations: Step-to pattern;Step-through pattern Gait velocity: decreased   General Gait Details: pt ambulating with good balacne and use of RW. SpO2 dipped into low 80s on RA, returned to 90's with seated rest break   Stairs            Wheelchair Mobility    Modified Rankin (Stroke Patients Only)       Balance Overall balance assessment: Mild deficits observed, not formally tested                                           Pertinent Vitals/Pain Pain Assessment: No/denies pain Faces Pain Scale: Hurts a little bit Pain Location: with cough Pain Descriptors / Indicators: Grimacing Pain Intervention(s): Monitored during session;Repositioned    Home Living Family/patient expects to be discharged to:: Private residence Living Arrangements: Spouse/significant other Available Help at Discharge: Family;Available 24 hours/day Type of Home: Apartment Home Access: Stairs to enter Entrance Stairs-Rails: None Entrance Stairs-Number of Steps: 1 step threshold Home Layout: One level Home Equipment: Cane - single point;Shower seat;Walker - 2 wheels      Prior Function Level of Independence: Independent with assistive device(s)         Comments: Uses a SPC for functional mobility; RW for in community. Independent in ADLs and light IADLs. Husband typically drives. Enjoys making jewery, painting, and listening to music.     Hand Dominance   Dominant Hand: Right    Extremity/Trunk Assessment   Upper  Extremity Assessment Upper Extremity Assessment: Overall WFL for tasks assessed    Lower Extremity Assessment Lower Extremity Assessment: Overall WFL for tasks assessed    Cervical / Trunk Assessment Cervical / Trunk Assessment: Other exceptions Cervical / Trunk Exceptions: Increased body habitus  Communication   Communication: No difficulties  Cognition Arousal/Alertness: Awake/alert Behavior During  Therapy: WFL for tasks assessed/performed Overall Cognitive Status: Within Functional Limits for tasks assessed                                 General Comments: Very pleasant      General Comments General comments (skin integrity, edema, etc.): Husband present at end of session    Exercises     Assessment/Plan    PT Assessment Patient needs continued PT services  PT Problem List Cardiopulmonary status limiting activity;Decreased activity tolerance       PT Treatment Interventions DME instruction;Gait training;Functional mobility training;Stair training;Therapeutic activities;Therapeutic exercise;Balance training    PT Goals (Current goals can be found in the Care Plan section)  Acute Rehab PT Goals Patient Stated Goal: Feel back to normal PT Goal Formulation: With patient Time For Goal Achievement: 05/31/18 Potential to Achieve Goals: Good    Frequency Min 3X/week   Barriers to discharge        Co-evaluation               AM-PAC PT "6 Clicks" Daily Activity  Outcome Measure Difficulty turning over in bed (including adjusting bedclothes, sheets and blankets)?: A Little Difficulty moving from lying on back to sitting on the side of the bed? : A Little Difficulty sitting down on and standing up from a chair with arms (e.g., wheelchair, bedside commode, etc,.)?: A Little Help needed moving to and from a bed to chair (including a wheelchair)?: A Little Help needed walking in hospital room?: A Little Help needed climbing 3-5 steps with a railing? : A Little 6 Click Score: 18    End of Session Equipment Utilized During Treatment: Gait belt Activity Tolerance: Patient tolerated treatment well Patient left: in chair;with call bell/phone within reach Nurse Communication: Mobility status PT Visit Diagnosis: Unsteadiness on feet (R26.81);Difficulty in walking, not elsewhere classified (R26.2)    Time: 1705-1730 PT Time Calculation (min) (ACUTE ONLY):  25 min   Charges:   PT Evaluation $PT Eval Low Complexity: 1 Low        Reinaldo Berber, PT, DPT Acute Rehabilitation Services Pager: [phone] Office: [phone]    Reinaldo Berber 05/24/2018, 6:18 PM

## 2018-05-24 NOTE — Care Management Note (Signed)
Case Management Note  Patient Details  Name: Mary [name] MRN: [id number] Date of Birth: 06/11/1941  Subjective/Objective:                 CHF COPD   Action/Plan:  Spoke to patient at bedside. She states she is from home with her spouse. Discussed her leaving AMA from [facility] recently. She states she went to [facility] because her husband was going to have a procedure there and if she was admitted she wanted to be in the same place, but she states they did not have a cardiologist that could see her so she left AMA.  She lives at home with spouse, he drives her to appointments, she denies any difficulties obtaining meds, she has a cane and RW.  Currently she is on 3L, is not oxygen dependent, will continue to follow for needs as treatment plan progresses.   Expected Discharge Date:                  Expected Discharge Plan:     In-House Referral:     Discharge planning Services     Post Acute Care Choice:    Choice offered to:     DME Arranged:    DME Agency:     HH Arranged:    HH Agency:     Status of Service:  In process, will continue to follow  If discussed at Long Length of Stay Meetings, dates discussed:    Additional Comments:  Carles Collet, RN 05/24/2018, 2:13 PM

## 2018-05-24 NOTE — Plan of Care (Signed)
  Problem: Education: Goal: Knowledge of General Education information will improve Description Including pain rating scale, medication(s)/side effects and non-pharmacologic comfort measures Outcome: Progressing   Problem: Activity: Goal: Risk for activity intolerance will decrease Outcome: Progressing   Problem: Elimination: Goal: Will not experience complications related to bowel motility Outcome: Progressing   Problem: Pain Managment: Goal: General experience of comfort will improve Outcome: Progressing   Problem: Safety: Goal: Ability to remain free from injury will improve Outcome: Progressing   Problem: Activity: Goal: Capacity to carry out activities will improve Outcome: Progressing

## 2018-05-24 NOTE — Progress Notes (Signed)
To the best of my knowledge, documentation by Harriette Bouillon [employer] nursing student is correct.

## 2018-05-24 NOTE — Consult Note (Addendum)
Cardiology Consultation:   Patient ID: Mary [name]; [id number]; [birth year]   Admit date: 05/23/2018 Date of Consult: 05/24/2018  Primary Care Provider: Hoyt Koch, MD Primary Cardiologist: Sinclair Grooms, MD Primary Electrophysiologist:  [name], MD  Chief Complaint: SOB  Patient Profile:   Mary [name] is a 77 y.o. female with a hx of chronic diastolic CHF, former tobacco abuse, diffuse CAD by cath 2016 (treated medically), tachybrady syndrome s/p St Jude PPM, carotid artery disease, atrial tachycardia on flecainide, paroxysmal atrial fib (per EP notes), colon CA s/p chemo, prior DVT, OSA, glaucoma, HTN, HLD, hypothyroidism, IDA, multinodular goiter, sleep apnea, morbid obesity s/p weight loss surgery, DM, CKD III who is being seen today for the evaluation of CHF and decreased EF at the request of [place].  History of Present Illness:   [name] has prior h/o as outlined above, with abnormal nuclear stress test 12/2014 with resultant cath showing Diffuse, intramyocardial distal LAD with up to 80% stenosis, small PDA/posterolateral branch contains ostial 80-90% narrowing, mid RCA, proximal LAD, and second obtuse marginal with less than 50% narrowing, EF 60%. No lesions were suitable for PCI so medical therapy was recommended. Prior EF in 2017 was 60-65%. She is on flecainide and eliquis for history of atrial fib/atrial tachycardia. Per IM she is also felt to have COPD.  About 2 weeks ago she recalls having a cold with congestion and cough. She thinks she had subjective fevers/chills. She treated this conservatively and initially improved but then developed worsening SOB which also progressed to DOE, orthopnea and lower extremity edema. She does not follow weights at home and does not think she has taken in any extra salt or fluid recently. She initially went to [facility] where she was tachypneic with hypoxia, on exam there was wheezing and rhonchi. CXR showed cardiomegaly but  no pulmonary edema. BNP was 257; d-dimer and troponins were negative. She was given Lasix, Solu-Medrol and nebulizer treatment and was admitted with acute respiratory failure with hypoxia and COPD exacerbation with CHF. She left AMA yesterday (per report today citing concerns over not seeing cardiology at [facility] although ordered) and came to [facility] last night to continue prior care interventions. She does report an episode of CP on Friday night which occurred at rest, resolved with NTG - otherwise has not been having frequent CP. She is very sedentary. 2D echo 05/22/18 showed mild LVH, decline in LVEF to 40-45% with diffuse HK, mild AS, mildly dilated LA, mild-mod TR, severely increased PASP.  She feels she is greatly improving with the care she has received so far - not quite at baseline yet but getting better.  Past Medical History:  Diagnosis Date  . Arthritis   . Atrial tachycardia ([place]) 04/23/2017  . CAD (coronary artery disease)    non obstructive  . 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 Procedure complicated by an atrial lead dislodgment and repeat procedure, 12/2013   . Carotid stenosis    Carotid US 5/16:  Bilateral ICA 1-39%; L vertebral retrograde; L BP 126/49, R BP 140/57  . CHF (congestive heart failure) ([place])   . Chronic lower back pain   . Colon cancer ([place])    a. s/p chemo  . Colon polyps   . Diabetic peripheral neuropathy ([place])   . DVT (deep venous thrombosis) ([place]) 2013   "twice behind knee on left side" (06/25/2013)  . Glaucoma   . Hyperlipidemia   .  Hypertension   . Hypothyroidism   . Iron deficiency anemia   . Multinodular goiter   . Osteoporosis   . Sleep apnea    a. resolved post weight loss   . Type II diabetes mellitus ([place])     Past Surgical History:  Procedure Laterality Date  . CARDIAC CATHETERIZATION  2006   Never had PCI, 3 caths total. Last one in [place]  . CARDIAC CATHETERIZATION N/A 02/04/2015     Procedure: Left Heart Cath and Coronary Angiography;  Surgeon: Belva Crome, MD;  Location: [place] CV LAB;  Service: Cardiovascular;  Laterality: N/A;  . CATARACT EXTRACTION, BILATERAL Bilateral 2013   "and put stent in my left eye for glaucoma" (06/25/2013)  . CHOLECYSTECTOMY  1980's  . COLECTOMY  10/2010   Tumor removal  . EYE MUSCLE SURGERY Bilateral ~ 1963   "muscles too long; eyes would go out and up; tied muscles to hold my eyes straight" (06/25/2013)  . LEAD REVISION  01-03-2014   atrial lead revision by Dr Rayann Heman  . LEAD REVISION N/A 01/03/2014   Procedure: LEAD REVISION;  Surgeon: Coralyn Connell Bognar, MD;  Location: [place] CATH LAB;  Service: Cardiovascular;  Laterality: N/A;  . PACEMAKER INSERTION  01/02/2014   STJ Assurity dual chamber pacemaker implnated by Dr Lovena Le for SSS  . PERMANENT PACEMAKER INSERTION N/A 01/02/2014   Procedure: PERMANENT PACEMAKER INSERTION;  Surgeon: Evans Lance, MD;  Location: [facility] CATH LAB;  Service: Cardiovascular;  Laterality: N/A;  . PORTACATH PLACEMENT Left 11/2010  . ROUX-EN-Y GASTRIC BYPASS  2009  . ULNAR TUNNEL RELEASE Left ~ 2008  . UMBILICAL HERNIA REPAIR  1970's?    (06/25/2013)     Inpatient Medications: Scheduled Meds: . apixaban  5 mg Oral BID  . arformoterol  15 mcg Nebulization BID  . atorvastatin  80 mg Oral q1800  . buPROPion  150 mg Oral Daily  . doxycycline  100 mg Oral Q12H  . flecainide  75 mg Oral BID  . furosemide  40 mg Intravenous Q12H  . gabapentin  300 mg Oral QHS  . [START ON 05/25/2018] Influenza vac split quadrivalent PF  0.5 mL Intramuscular Tomorrow-1000  . insulin aspart  0-20 Units Subcutaneous TID WC  . insulin aspart  0-5 Units Subcutaneous QHS  . insulin glargine  64 Units Subcutaneous Daily  . ipratropium-albuterol  3 mL Nebulization Q6H  . latanoprost  1 drop Right Eye QHS  . levothyroxine  125 mcg Oral QAC breakfast  . metoprolol succinate  12.5 mg Oral QHS  . potassium chloride SA  40 mEq Oral Daily  .  predniSONE  20 mg Oral Q breakfast  . sodium chloride flush  3 mL Intravenous Q12H   Continuous Infusions: . sodium chloride     PRN Meds: sodium chloride, acetaminophen, HYDROcodone-acetaminophen, nitroGLYCERIN, ondansetron (ZOFRAN) IV, sodium chloride flush  Home Meds: Prior to Admission medications   Medication Sig Start Date End Date Taking? Authorizing Provider  atorvastatin (LIPITOR) 80 MG tablet TAKE 1 TABLET(80 MG) BY MOUTH DAILY Patient taking differently: Take 80 mg by mouth daily at 6 PM.  04/18/18  Yes Hoyt Koch, MD  bimatoprost (LUMIGAN) 0.01 % SOLN Place 1 drop into the right eye at bedtime.    Yes [provider]  buPROPion (WELLBUTRIN XL) 150 MG 24 hr tablet Take 1 tablet (150 mg total) by mouth daily. 05/12/18  Yes Hoyt Koch, MD  ELIQUIS 5 MG TABS tablet TAKE 1 TABLET BY MOUTH  TWICE A DAY Patient taking differently: Take 5 mg by mouth 2 (two) times daily.  05/23/18  Yes Allred, Jeneen Rinks, MD  flecainide (TAMBOCOR) 50 MG tablet Take 1.5 tablets (75 mg total) by mouth 2 (two) times daily. 05/10/18  Yes Seiler, Amber K, NP  furosemide (LASIX) 80 MG tablet Take 1 tablet (80 mg total) by mouth daily. 01/24/18  Yes Hoyt Koch, MD  gabapentin (NEURONTIN) 300 MG capsule Take 300 mg by mouth at bedtime.    Yes [provider]  HYDROcodone-acetaminophen (NORCO/VICODIN) 5-325 MG tablet Take 1 tablet by mouth every 4 (four) hours as needed for moderate pain. 03/21/18  Yes Hoyt Koch, MD  Insulin Glargine (BASAGLAR KWIKPEN) 100 UNIT/ML SOPN Inject 64 Units into the skin daily. 01/09/18  Yes [provider]  levothyroxine (SYNTHROID, LEVOTHROID) 125 MCG tablet Take 125 mcg by mouth daily before breakfast. 01/10/18  Yes [provider]  metoprolol succinate (TOPROL XL) 25 MG 24 hr tablet Take 0.5 tablets (12.5 mg total) by mouth at bedtime. 05/10/18  Yes Seiler, Amber K, NP  nitroGLYCERIN (NITROSTAT) 0.4 MG SL tablet  Place 1 tablet (0.4 mg total) under the tongue every 5 (five) minutes as needed for chest pain. 08/26/17  Yes Belva Crome, MD  potassium chloride SA (K-DUR,KLOR-CON) 20 MEQ tablet TAKE 2 TABLETS BY MOUTH ONCE DAILY Patient taking differently: Take 40 mEq by mouth daily.  08/15/17  Yes Eileen Stanford, PA-C    Allergies:    Allergies  Allergen Reactions  . Other Other (See Comments)    NO Blind Scopes with Naso Gastric tube.  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 Adhesive [Tape]     Tape - burns - pls use paper tape or elastic bandage  . Aspirin Other (See Comments)    S/P gastric bypass surgery, states her MD told her to not take Aspirin.  . Latex     Tape= burns  . Nsaids Other (See Comments)    S/P gastric bypass-told not to take. GI bleeds with NSAIDS    Social History:   Social History   Socioeconomic History  . Marital status: Married    Spouse name: [name]  . Number of children: 0  . Years of education: Not on file  . Highest education level: Not on file  Occupational History  . Occupation: Retired    Comment: office work  Scientific laboratory technician  . Financial resource strain: Not very hard  . Food insecurity:    Worry: Never true    Inability: Never true  . Transportation needs:    Medical: No    Non-medical: No  Tobacco Use  . Smoking status: Former Smoker    Packs/day: 1.00    Years: 30.00    Pack years: 30.00    Types: Cigarettes    Last attempt to quit: 02/2017    Years since quitting: 1.2  . Smokeless tobacco: Never Used  . Tobacco comment: started in her [age] until 2009   Substance and Sexual Activity  . Alcohol use: Not Currently    Alcohol/week: 0.0 standard drinks  . Drug use: No  . Sexual activity: Yes  Lifestyle  . Physical activity:    Days per week: 0 days    Minutes per session: 0 min  . Stress: Only a little  Relationships  . Social connections:    Talks on phone: More than three times a week    Gets together: More than three times a week  Attends religious service: More than 4 times per year    Active member of club or organization: Yes    Attends meetings of clubs or organizations: More than 4 times per year    Relationship status: Married  . Intimate partner violence:    Fear of current or ex partner: No    Emotionally abused: No    Physically abused: No    Forced sexual activity: No  Other Topics Concern  . Not on file  Social History Narrative   Married to husband, [name]   No children   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   Recently moved back here from [place]     Family History:   The patient's family history includes Bladder Cancer in her brother and cousin; Breast cancer in her maternal aunt, mother, and sister; Clotting disorder in her sister; Colon polyps in her brother and brother; Diabetes in her maternal grandmother, mother, and sister; Heart attack in her brother, father, and sister; Heart disease in her father; Pancreatic cancer in her brother; Prostate cancer in her brother.   ROS:  Please see the history of present illness. Does eat ice All other ROS reviewed and negative.     Physical Exam/Data:   Vitals:   05/24/18 1005 05/24/18 1240 05/24/18 1446 05/24/18 1646  BP: (!) 145/48 118/66  (!) 91/52  Pulse: 81 69  70  Resp:  18  16  Temp:  98.5 F (36.9 C)  98.5 F (36.9 C)  TempSrc:  Oral  Oral  SpO2: 98% 94% 94% 95%  Weight:      Height:        Intake/Output Summary (Last 24 hours) at 05/24/2018 1825 Last data filed at 05/24/2018 1715 Gross per 24 hour  Intake 1496 ml  Output 4000 ml  Net -2504 ml   Filed Weights   05/23/18 2305 05/24/18 0222 05/24/18 0431  Weight: 123.6 kg 123.6 kg 122.1 kg   Body mass index is 42.16 kg/m.  General: Well developed, well nourished morbidly obese, in no acute distress. Head: Normocephalic, atraumatic, sclera non-icteric, no xanthomas, nares are without discharge.  Neck: Negative for carotid bruits. JVD not  elevated. Lungs: Diffusely wheezy/rhonchorous. Breathing is unlabored. Heart: RRR with S1 S2. No murmurs, rubs, or gallops appreciated. Abdomen: Soft, non-tender, non-distended with normoactive bowel sounds. No hepatomegaly. No rebound/guarding. No obvious abdominal masses. Msk:  Strength and tone appear normal for age. Extremities: No clubbing or cyanosis. 1+ pale BLE edema.  Distal pedal pulses are 2+ and equal bilaterally. Neuro: Alert and oriented X 3. No facial asymmetry. No focal deficit. Moves all extremities spontaneously. Psych:  Responds to questions appropriately with a normal affect.  EKG:  The EKG was personally reviewed and demonstrates AV paced rhythm  Laboratory Data:  Chemistry Recent Labs  Lab 05/22/18 0535 05/23/18 1840 05/24/18 0815  NA 128* 130* 134*  K 4.7 4.3 3.5  CL 87* 87* 90*  CO2 31 30 32  GLUCOSE 378* 176* 104*  BUN 23 27* 24*  CREATININE 1.23* 1.26* 1.16*  CALCIUM 8.5* 8.8* 8.9  GFRNONAA 42* 40* 45*  GFRAA 48* 47* 52*  ANIONGAP 10 13 12     No results for input(s): PROT, ALBUMIN, AST, ALT, ALKPHOS, BILITOT in the last 168 hours. Hematology Recent Labs  Lab 05/21/18 1813 05/23/18 1840 05/24/18 0815  WBC 9.6 12.8* 9.8  RBC 3.96 3.71* 3.76*  HGB 9.8* 9.2* 9.3*  HCT 31.8* 30.6* 30.7*  MCV 80.3  82.5 81.6  MCH 24.7* 24.8* 24.7*  MCHC 30.8 30.1 30.3  RDW 15.5 15.9* 15.8*  PLT 445* 420* 433*   Cardiac EnzymesNo results for input(s): TROPONINI in the last 168 hours.  Recent Labs  Lab 05/21/18 1820 05/21/18 2153 05/23/18 1902  TROPIPOC 0.00 0.00 0.00    BNP Recent Labs  Lab 05/21/18 1813 05/23/18 1840  BNP 257.3* 196.3*    DDimer  Recent Labs  Lab 05/21/18 1813  DDIMER 0.41    Radiology/Studies:  Dg Chest 2 View  Result Date: 05/23/2018 CLINICAL DATA:  Nausea, shortness of Breath EXAM: CHEST - 2 VIEW COMPARISON:  05/21/2018 FINDINGS: Cardiomegaly, vascular congestion and peribronchial thickening. Left pacer remains in place,  unchanged. No confluent opacities or effusions. No acute bony abnormality. IMPRESSION: Cardiomegaly, vascular congestion. Mild bronchitic changes. Electronically Signed   By: Rolm Baptise M.D.   On: 05/23/2018 19:55   Dg Chest 2 View  Result Date: 05/21/2018 CLINICAL DATA:  Dyspnea EXAM: CHEST - 2 VIEW COMPARISON:  04/25/2017 chest radiograph. FINDINGS: Stable configuration of 2 lead left subclavian pacemaker. Stable cardiomediastinal silhouette with mild cardiomegaly. No pneumothorax. No pleural effusion. Cephalization of the pulmonary vasculature without overt pulmonary edema. No acute consolidative airspace disease. IMPRESSION: Mild cardiomegaly without overt pulmonary edema. Electronically Signed   By: Ilona Sorrel M.D.   On: 05/21/2018 19:01       Cath 01/2015 [name]  CARDIAC CATHETERIZATION  Order# [id number]  Reading physician: Belva Crome, MD Ordering physician: Belva Crome, MD Study date: 02/04/15  Physicians   Panel Physicians Referring Physician Case Authorizing Physician  Belva Crome, MD (Primary)    Procedures   Left Heart Cath and Coronary Angiography  Conclusion   1. Mid RCA lesion, 40% stenosed. 2. 1st RPLB lesion, 90% stenosed. 3. 2nd Mrg lesion, 35% stenosed. 4. Prox LAD lesion, 50% stenosed. 5. Dist LAD lesion, 80% stenosed.    Diffuse, intramyocardial distal LAD with up to 80% stenosis is noted.  The mid RCA, proximal LAD, and second obtuse marginal contains less than 50% narrowing.  The small PDA/posterolateral branch contains ostial 80-90% narrowing  Normal left ventricular function with EF estimated at 60%   RECOMMENDATIONS:  Medical management. No lesions are suitable for PCI. Chest discomfort should be treated with nitroglycerin. Aggressive risk factor modification.    Assessment and Plan:   1. Acute hypoxic respiratory failure, likely multifactorial from AECOPD perpetuating acute on chronic (newly combined) CHF - improving  with multifactorial approach. LVEF is newly depressed; had disease by cath in 2016 but not amenable to PCI. Etiology of LV dysfunction unclear but may need to be clarified further given antiarrhythmic therapy, but challenging given body habitus. She is not presently a good candidate for a lexiscan nuclear stress test given her wheezing. We will follow for now. Unable to titrate med regimen further due to baseline low BP. Continue present regimen as BP tolerates.  2. CAD, managed medically in 2016 - one episode of CP recently, otherwise no escalating episodes since last cath. Troponins neg @ [facility]. Would follow for now. See above. Not on ASA as she is on Eliquis. Check lipid profile in AM with LFTs.  3. CKD stage III - Cr appears at baseline.  4. Severe pulm HTN - ? sequelae of respiratory disease - will need eval for [facility] prior to DC. Would also suggest weight loss and OP f/u of OSA.  5. Morbid obesity - would recommend [facility].  6. Paroxysmal atrial fib/atrial tach - maintaining Av pacing on telemetry, on Eliquis. Would suggest rounding team speak with EP tomorrow about continuing flecainide with LV dysfunction (also has known CAD).  7. H/o iron deficiency anemia - pt reports craving ice. Hgb is slightly lower than baseline - further eval per IM.   For questions or updates, please contact [place] Please consult [email or web address] for contact info under Cardiology/STEMI.    Signed, Charlie Pitter, PA-C  05/24/2018 6:25 PM  Personally seen and examined. Agree with above.  77 year old with chronic diastolic heart failure Saint Jude pacemaker with diffuse nonobstructive CAD on catheterization 2016 on flecanide for atrial tachycardia seen by Dr. Rayann Heman.  Comes in with approximately 1 week history of worsening shortness of breath.  Severe orthopnea.  Class IV heart failure symptoms originally.  Since her hospitalization, she has "become a new person".  She  feels much better after Lasix administration.  No active chest pain.  No bleeding.  When asked about fevers, she did state that last week she felt febrile.  GEN: Well nourished, well developed, in no acute distress  HEENT: normal  Neck: no JVD, carotid bruits, or masses Cardiac: RRR; no murmurs, rubs, or gallops,no edema  Respiratory:  clear to auscultation bilaterally, normal work of breathing GI: soft, nontender, nondistended, + BS MS: no deformity or atrophy  Skin: warm and dry, no rash Neuro:  Alert and Oriented x 3, Strength and sensation are intact Psych: euthymic mood, full affect  Prior cath report reviewed as above. Creatinine 1.16, baseline.  Assessment and plan:  Acute on chronic diastolic/systolic heart failure - Her EF is mildly reduced at 45% currently.  This is a decrease from previously normal EF.  Continue with IV Lasix.  Low level blood pressure, can only tolerate low level metoprolol. - Since she is feeling so much better, I would be an advocate for continued medical management and rechecking her echocardiogram in approximately 3 months.  If it still remains reduced, we could have discussion about potential cardiac catheterization repeat.  Atrial tachycardia -Has been on flecanide for quite some time, stable without any arrhythmias.  She does have underlying moderate, non-flow-limiting CAD.  Now she has a mildly reduced EF.  We will place a call into Dr. Rayann Heman to see if we should continue this medication.  Coronary artery disease - As described above, nonobstructive.  Brief rare episode of chest pain recently.  Troponins were negative.  Eliquis only.  Morbid obesity -Continue to encourage weight loss.  Pulmonary hypertension - Likely multifactorial stemming from sleep apnea perhaps, morbid obesity, left heart disease/diastolic dysfunction.  Candee Furbish, MD

## 2018-05-24 NOTE — H&P (Signed)
History and Physical    Mary [name] Mary [name] DOB: [birth year] DOA: 05/23/2018  PCP: Hoyt Koch, MD  Patient coming from: Home  I have personally briefly reviewed patient's old medical records in [place]  Chief Complaint: SOB  HPI: Mary [name] is a 77 y.o. female with medical history significant of A. fib, CHF, type 2 diabetes, presents with shortness of breath.  Patient was admitted a few days ago to [facility] for CHF exacerbation.  She was upset because she was unable to see cardiology there and left AMA yesterday.  Today she continued to have orthopnea, slight cough and shortness of breath.  Patient had a chest x-ray did show some vascular congestion .she was given 80 mg of IV Lasix in ED.  She had urine output x1.  Patient denies any known history of COPD but has a long history of tobacco use.  sHe initially became ill with cold-like symptoms 2 weeks ago.  She got better for short time but cough and orthopnea resumed .  Her lower extremities have been increasing in its swelling over the past week.  States she is compliant with her medications and follows with a local cardiologist here.  Patient denies any chest pain.     ED Course: Patient had a chest x-ray which showed show some vascular  Congestion.she was given 80 mg of IV Lasix in ED.   Review of Systems: Denies chest pain, positive for orthopnea and lower extremity edema and cough All others reviewed with patient  and are  negative unless otherwise stated   Past Medical History:  Diagnosis Date  . Arthritis   . Atrial tachycardia ([place]) 04/23/2017  . CAD (coronary artery disease)    non obstructive  . 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 Procedure complicated by an atrial lead dislodgment and repeat procedure, 12/2013   . Carotid stenosis    Carotid US 5/16:  Bilateral ICA 1-39%; L vertebral retrograde; L BP 126/49, R BP 140/57  . CHF  (congestive heart failure) ([place])   . Chronic lower back pain   . Colon cancer ([place])    a. s/p chemo  . Colon polyps   . Diabetic peripheral neuropathy ([place])   . DVT (deep venous thrombosis) ([place]) 2013   "twice behind knee on left side" (06/25/2013)  . Glaucoma   . Hyperlipidemia   . Hypertension   . Hypothyroidism   . Iron deficiency anemia   . Multinodular goiter   . Osteoporosis   . Sleep apnea    a. resolved post weight loss   . Type II diabetes mellitus ([place])     Past Surgical History:  Procedure Laterality Date  . CARDIAC CATHETERIZATION  2006   Never had PCI, 3 caths total. Last one in [place]  . CARDIAC CATHETERIZATION N/A 02/04/2015   Procedure: Left Heart Cath and Coronary Angiography;  Surgeon: Belva Crome, MD;  Location: [facility] LAB;  Service: Cardiovascular;  Laterality: N/A;  . CATARACT EXTRACTION, BILATERAL Bilateral 2013   "and put stent in my left eye for glaucoma" (06/25/2013)  . CHOLECYSTECTOMY  1980's  . COLECTOMY  10/2010   Tumor removal  . EYE MUSCLE SURGERY Bilateral ~ 1963   "muscles too long; eyes would go out and up; tied muscles to hold my eyes straight" (06/25/2013)  . LEAD REVISION  01-03-2014   atrial lead revision by Dr Rayann Heman  . LEAD REVISION  N/A 01/03/2014   Procedure: LEAD REVISION;  Surgeon: Coralyn Mark, MD;  Location: [facility] CATH LAB;  Service: Cardiovascular;  Laterality: N/A;  . PACEMAKER INSERTION  01/02/2014   STJ Assurity dual chamber pacemaker implnated by Dr Lovena Le for SSS  . PERMANENT PACEMAKER INSERTION N/A 01/02/2014   Procedure: PERMANENT PACEMAKER INSERTION;  Surgeon: Evans Lance, MD;  Location: [facility] CATH LAB;  Service: Cardiovascular;  Laterality: N/A;  . PORTACATH PLACEMENT Left 11/2010  . ROUX-EN-Y GASTRIC BYPASS  2009  . ULNAR TUNNEL RELEASE Left ~ 2008  . UMBILICAL HERNIA REPAIR  1970's?    (06/25/2013)     reports that she quit smoking about 14 months ago. Her smoking use included cigarettes. She has a 30.00  pack-year smoking history. She has never used smokeless tobacco. She reports that she drank alcohol. She reports that she does not use drugs.  Lives with husband  Allergies  Allergen Reactions  . Other Other (See Comments)    NO Blind Scopes with Naso Gastric tube.  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 Adhesive [Tape]     Tape - burns - pls use paper tape or elastic bandage  . Aspirin Other (See Comments)    S/P gastric bypass surgery, states her MD told her to not take Aspirin.  . Latex     Tape= burns  . Nsaids Other (See Comments)    S/P gastric bypass-told not to take. GI bleeds with NSAIDS    Family History  Problem Relation Age of Onset  . Breast cancer Mother   . Diabetes Mother   . Heart disease Father   . Heart attack Father   . Heart attack Sister   . Heart attack Brother   . Bladder Cancer Brother   . Prostate cancer Brother   . Colon polyps Brother   . Breast cancer Sister   . Breast cancer Maternal Aunt        x 2 aunts  . Pancreatic cancer Brother   . Colon polyps Brother   . Bladder Cancer Cousin   . Clotting disorder Sister   . Diabetes Sister        x 3  . Diabetes Maternal Grandmother      Prior to Admission medications   Medication Sig Start Date End Date Taking? Authorizing Provider  atorvastatin (LIPITOR) 80 MG tablet TAKE 1 TABLET(80 MG) BY MOUTH DAILY Patient taking differently: Take 80 mg by mouth daily at 6 PM.  04/18/18  Yes Hoyt Koch, MD  bimatoprost (LUMIGAN) 0.01 % SOLN Place 1 drop into the right eye at bedtime.    Yes [provider]  buPROPion (WELLBUTRIN XL) 150 MG 24 hr tablet Take 1 tablet (150 mg total) by mouth daily. 05/12/18  Yes Hoyt Koch, MD  ELIQUIS 5 MG TABS tablet TAKE 1 TABLET BY MOUTH TWICE A DAY Patient taking differently: Take 5 mg by mouth 2 (two) times daily.  05/23/18  Yes Allred, Jeneen Rinks, MD  flecainide (TAMBOCOR) 50 MG tablet Take 1.5 tablets (75 mg total) by mouth 2 (two) times daily. 05/10/18   Yes Seiler, Amber K, NP  furosemide (LASIX) 80 MG tablet Take 1 tablet (80 mg total) by mouth daily. 01/24/18  Yes Hoyt Koch, MD  gabapentin (NEURONTIN) 300 MG capsule Take 300 mg by mouth at bedtime.    Yes [provider]  HYDROcodone-acetaminophen (NORCO/VICODIN) 5-325 MG tablet Take 1 tablet by mouth every 4 (four) hours as needed for moderate pain.  03/21/18  Yes Hoyt Koch, MD  Insulin Glargine (BASAGLAR KWIKPEN) 100 UNIT/ML SOPN Inject 64 Units into the skin daily. 01/09/18  Yes [provider]  levothyroxine (SYNTHROID, LEVOTHROID) 125 MCG tablet Take 125 mcg by mouth daily before breakfast. 01/10/18  Yes [provider]  metoprolol succinate (TOPROL XL) 25 MG 24 hr tablet Take 0.5 tablets (12.5 mg total) by mouth at bedtime. 05/10/18  Yes Seiler, Amber K, NP  nitroGLYCERIN (NITROSTAT) 0.4 MG SL tablet Place 1 tablet (0.4 mg total) under the tongue every 5 (five) minutes as needed for chest pain. 08/26/17  Yes Belva Crome, MD  potassium chloride SA (K-DUR,KLOR-CON) 20 MEQ tablet TAKE 2 TABLETS BY MOUTH ONCE DAILY Patient taking differently: Take 40 mEq by mouth daily.  08/15/17  Yes Eileen Stanford, PA-C    Physical Exam: Vitals:   05/23/18 2130 05/23/18 2152 05/23/18 2305 05/23/18 2312  BP: 136/69 111/79  (!) 122/56  Pulse: 71 84  72  Resp: (!) 21 20  18   Temp:    97.8 F (36.6 C)  TempSrc:    Oral  SpO2: 100% 98%  100%  Weight:   (P) 123.6 kg   Height:   (P) 5\' 7"  (1.702 m)     Constitutional: NAD, calm, comfortable ,sitting  upright Vitals:   05/23/18 2130 05/23/18 2152 05/23/18 2305 05/23/18 2312  BP: 136/69 111/79  (!) 122/56  Pulse: 71 84  72  Resp: (!) 21 20  18   Temp:    97.8 F (36.6 C)  TempSrc:    Oral  SpO2: 100% 98%  100%  Weight:   (P) 123.6 kg   Height:   (P) 5\' 7"  (1.702 m)    Eyes: PERRL, lids and conjunctivae normal ENMT: Mucous membranes are moist. Posterior pharynx clear of any exudate or  lesions.Normal dentition.  Neck: normal, supple, no masses,  Respiratory: Diffuse wheezes throughout . Normal respiratory effort. No accessory muscle use.  Cardiovascular: Regular rate and rhythm, no murmurs / rubs / gallops. No extremity edema. 2+ pedal pulses.  Abdomen: no tenderness, no masses palpated. No hepatosplenomegaly. Bowel sounds positive.  Morbid obesity Musculoskeletal: no clubbing / cyanosis. No joint deformity upper and lower extremities.  no contractures. Normal muscle tone.  Skin: no rashes, lesions, ulcers. No induration Neurologic: CN 2-12 grossly intact. Strength 5/5 in all 4.  Psychiatric: Normal judgment and insight. Alert and oriented x 3. Normal mood.     Labs on Admission: I have personally reviewed following labs and imaging studies  CBC: Recent Labs  Lab 05/21/18 1813 05/23/18 1840  WBC 9.6 12.8*  NEUTROABS 6.6  --   HGB 9.8* 9.2*  HCT 31.8* 30.6*  MCV 80.3 82.5  PLT 445* 299*   Basic Metabolic Panel: Recent Labs  Lab 05/21/18 1813 05/22/18 0535 05/23/18 1840  NA 132* 128* 130*  K 3.3* 4.7 4.3  CL 87* 87* 87*  CO2 31 31 30   GLUCOSE 101* 378* 176*  BUN 19 23 27*  CREATININE 1.08* 1.23* 1.26*  CALCIUM 9.2 8.5* 8.8*   GFR: Estimated Creatinine Clearance: 51.9 mL/min (A) (by C-G formula based on SCr of 1.26 mg/dL (H)). Liver Function Tests: No results for input(s): AST, ALT, ALKPHOS, BILITOT, PROT, ALBUMIN in the last 168 hours. No results for input(s): LIPASE, AMYLASE in the last 168 hours. No results for input(s): AMMONIA in the last 168 hours. Coagulation Profile: No results for input(s): INR, PROTIME in the last 168 hours. Cardiac Enzymes: No  results for input(s): CKTOTAL, CKMB, CKMBINDEX, TROPONINI in the last 168 hours. BNP (last 3 results) No results for input(s): PROBNP in the last 8760 hours. HbA1C: No results for input(s): HGBA1C in the last 72 hours. CBG: Recent Labs  Lab 05/22/18 1203 05/23/18 2258  GLUCAP 252* 131*    Lipid Profile: No results for input(s): CHOL, HDL, LDLCALC, TRIG, CHOLHDL, LDLDIRECT in the last 72 hours. Thyroid Function Tests: No results for input(s): TSH, T4TOTAL, FREET4, T3FREE, THYROIDAB in the last 72 hours. Anemia Panel: No results for input(s): VITAMINB12, FOLATE, FERRITIN, TIBC, IRON, RETICCTPCT in the last 72 hours. Urine analysis:    Component Value Date/Time   COLORURINE YELLOW 08/01/2016 [place] 08/01/2016 0539   LABSPEC 1.007 08/01/2016 0539   PHURINE 6.0 08/01/2016 0539   GLUCOSEU NEGATIVE 08/01/2016 0539   HGBUR NEGATIVE 08/01/2016 0539   BILIRUBINUR NEGATIVE 08/01/2016 0539   KETONESUR NEGATIVE 08/01/2016 0539   PROTEINUR NEGATIVE 08/01/2016 0539   NITRITE NEGATIVE 08/01/2016 0539   LEUKOCYTESUR MODERATE (A) 08/01/2016 0539    Radiological Exams on Admission: Dg Chest 2 View  Result Date: 05/23/2018 CLINICAL DATA:  Nausea, shortness of Breath EXAM: CHEST - 2 VIEW COMPARISON:  05/21/2018 FINDINGS: Cardiomegaly, vascular congestion and peribronchial thickening. Left pacer remains in place, unchanged. No confluent opacities or effusions. No acute bony abnormality. IMPRESSION: Cardiomegaly, vascular congestion. Mild bronchitic changes. Electronically Signed   By: Rolm Baptise M.D.   On: 05/23/2018 19:55    EKG: Independently reviewed. dual paced rhythm  Assessment/Plan Principal Problem:   Acute on chronic systolic CHF (congestive heart failure) (HCC) Active Problems:   Obesity   Diabetes mellitus with complication (HCC)   Hypertension   PAF (paroxysmal atrial fibrillation) (HCC)   Cardiac pacemaker in situ - St Jude   Depression   COPD with acute bronchitis (HCC)  chronic anemia CKD   -Bronchodilators, low-dose p.o. steroids, empiric antibiotics, supplemental oxygen as needed to achieve O2 sats greater than 90%, add [place].  Commend pulmonary function test and follow with pulmonary -Admit to telemetry, IV Lasix x one, home Lasix  p.o. morning follow ins and outs and daily weights, restrict fluids.  Trop flat at 0,  Recent  echo shows EF 40-45% down from EF of 60 to 65% in 2017.  Recommend cardiology consultation.  Continue beta-blocker statin -Continue flecainide and Eliquis for history of A. Fib -Carb consistent diet, sliding scale insulin , check hemoglobin A1c, continue home basal insulin regimen -Chronic iron deficiency anemia with a unremarkable colonoscopy per history.  Patient states she is unable to absorb iron due to her history of gastric bypass.  May need IV iron level down to 9.2 from 11 - chronic Kidney disease stable creatinine was between 1.1-1.5   DVT prophylaxis: Continue Eliquis Code Status: Full  Disposition Plan: Home 2 days  Admission status: Observation telemetry    Altair Appenzeller Johnson-Pitts MD [facility] Pager [phone]  If 7PM-7AM, please contact night-coverage [email or web address] Password [facility]  05/24/2018, 12:52 AM

## 2018-05-25 ENCOUNTER — Other Ambulatory Visit (HOSPITAL_COMMUNITY): Payer: Medicare Other

## 2018-05-25 LAB — LIPID PANEL
CHOL/HDL RATIO: 3.5 ratio
Cholesterol: 124 mg/dL (ref 0–200)
HDL: 35 mg/dL — AB (ref >40)
LDL CALC: 73 mg/dL (ref 0–99)
Triglycerides: 81 mg/dL (ref <150)
VLDL: 16 mg/dL (ref 0–40)

## 2018-05-25 LAB — HEPATIC FUNCTION PANEL
ALT: 19 U/L (ref 0–44)
AST: 26 U/L (ref 15–41)
Albumin: 3 g/dL — ABNORMAL LOW (ref 3.5–5.0)
Alkaline Phosphatase: 103 U/L (ref 38–126)
BILIRUBIN DIRECT: 0.1 mg/dL (ref 0.0–0.2)
Indirect Bilirubin: 0.4 mg/dL (ref 0.3–0.9)
TOTAL PROTEIN: 6.7 g/dL (ref 6.5–8.1)
Total Bilirubin: 0.5 mg/dL (ref 0.3–1.2)

## 2018-05-25 LAB — BASIC METABOLIC PANEL
Anion gap: 9 (ref 5–15)
BUN: 26 mg/dL — AB (ref 8–23)
CO2: 35 mmol/L — ABNORMAL HIGH (ref 22–32)
Calcium: 9 mg/dL (ref 8.9–10.3)
Chloride: 94 mmol/L — ABNORMAL LOW (ref 98–111)
Creatinine, Ser: 1.18 mg/dL — ABNORMAL HIGH (ref 0.44–1.00)
GFR calc Af Amer: 51 mL/min — ABNORMAL LOW (ref >60)
GFR, EST NON AFRICAN AMERICAN: 44 mL/min — AB (ref >60)
GLUCOSE: 70 mg/dL (ref 70–99)
POTASSIUM: 4 mmol/L (ref 3.5–5.1)
Sodium: 138 mmol/L (ref 135–145)

## 2018-05-25 LAB — GLUCOSE, CAPILLARY
GLUCOSE-CAPILLARY: 217 mg/dL — AB (ref 70–99)
GLUCOSE-CAPILLARY: 241 mg/dL — AB (ref 70–99)
Glucose-Capillary: 59 mg/dL — ABNORMAL LOW (ref 70–99)
Glucose-Capillary: 97 mg/dL (ref 70–99)
Glucose-Capillary: 156 mg/dL — ABNORMAL HIGH (ref 70–99)

## 2018-05-25 MED ORDER — IPRATROPIUM-ALBUTEROL 0.5-2.5 (3) MG/3ML IN SOLN
3.0000 mL | Freq: Four times a day (QID) | RESPIRATORY_TRACT | Status: DC
  Administered 2018-05-25 – 2018-05-26 (×3): 3 mL via RESPIRATORY_TRACT
  Filled 2018-05-25 (×3): qty 3

## 2018-05-25 MED ORDER — IPRATROPIUM-ALBUTEROL 0.5-2.5 (3) MG/3ML IN SOLN: 3.0000 mL | Freq: Two times a day (BID) | RESPIRATORY_TRACT | Status: DC

## 2018-05-25 MED ORDER — ALBUTEROL SULFATE (2.5 MG/3ML) 0.083% IN NEBU: 2.5000 mg | INHALATION_SOLUTION | Freq: Four times a day (QID) | RESPIRATORY_TRACT | Status: DC | PRN

## 2018-05-25 MED ORDER — INSULIN GLARGINE 100 UNIT/ML ~~LOC~~ SOLN
58.0000 [IU] | Freq: Every day | SUBCUTANEOUS | Status: DC
  Filled 2018-05-25: qty 0.58

## 2018-05-25 NOTE — Plan of Care (Signed)
  Problem: Education: ?Goal: Knowledge of General Education information will improve ?Description: Including pain rating scale, medication(s)/side effects and non-pharmacologic comfort measures ?Outcome: Progressing ?  ?Problem: Clinical Measurements: ?Goal: Ability to maintain clinical measurements within normal limits will improve ?Outcome: Progressing ?Goal: Respiratory complications will improve ?Outcome: Progressing ?  ?Problem: Activity: ?Goal: Risk for activity intolerance will decrease ?Outcome: Progressing ?  ?Problem: Coping: ?Goal: Level of anxiety will decrease ?Outcome: Progressing ?  ?Problem: Pain Managment: ?Goal: General experience of comfort will improve ?Outcome: Progressing ?  ?

## 2018-05-25 NOTE — Progress Notes (Signed)
Curbside consulted Dr. Curt Bears who recommend stop flecainide  Signed, Almyra Deforest PA Pager: [phone]

## 2018-05-25 NOTE — Progress Notes (Addendum)
Progress Note  Patient Name: Mary [name] Date of Encounter: 05/25/2018  Primary Cardiologist: Sinclair Grooms, MD  Electrophysiologist: Dr. Rayann Heman  Subjective   Had viral like symptom 2 weeks ago, complained of LE edema, orthopnea, and PND for the past 1 week. Breathing improving after diuresis last night.   Inpatient Medications    Scheduled Meds: . apixaban  5 mg Oral BID  . arformoterol  15 mcg Nebulization BID  . atorvastatin  80 mg Oral q1800  . buPROPion  150 mg Oral Daily  . doxycycline  100 mg Oral Q12H  . flecainide  75 mg Oral BID  . furosemide  40 mg Intravenous Q12H  . gabapentin  300 mg Oral QHS  . insulin aspart  0-20 Units Subcutaneous TID WC  . insulin aspart  0-5 Units Subcutaneous QHS  . insulin glargine  64 Units Subcutaneous Daily  . ipratropium-albuterol  3 mL Nebulization Q6H  . latanoprost  1 drop Right Eye QHS  . levothyroxine  125 mcg Oral QAC breakfast  . metoprolol succinate  12.5 mg Oral QHS  . potassium chloride SA  40 mEq Oral Daily  . predniSONE  20 mg Oral Q breakfast  . sodium chloride flush  3 mL Intravenous Q12H   Continuous Infusions: . sodium chloride     PRN Meds: sodium chloride, acetaminophen, albuterol, HYDROcodone-acetaminophen, nitroGLYCERIN, ondansetron (ZOFRAN) IV, sodium chloride flush   Vital Signs    Vitals:   05/25/18 0401 05/25/18 0628 05/25/18 0700 05/25/18 0817  BP: (!) 86/42 (!) 89/71 94/61   Pulse: 73 70 72   Resp: 18  14   Temp: 98.4 F (36.9 C)  97.7 F (36.5 C)   TempSrc: Oral  Oral   SpO2: 98%  98% 94%  Weight: 122.7 kg     Height:        Intake/Output Summary (Last 24 hours) at 05/25/2018 1139 Last data filed at 05/25/2018 1100 Gross per 24 hour  Intake 2216 ml  Output 3200 ml  Net -984 ml   Filed Weights   05/24/18 0222 05/24/18 0431 05/25/18 0401  Weight: 123.6 kg 122.1 kg 122.7 kg    Telemetry    Paced rhythm - Personally Reviewed  ECG    Paced rhythm - Personally  Reviewed  Physical Exam   GEN: No acute distress.   Neck: No JVD Cardiac: RRR, no murmurs, rubs, or gallops.  Respiratory: diminished breath sound, worse near bilateral bases GI: Soft, nontender, non-distended  MS: No deformity. Trace edema in bilateral LE Neuro:  Nonfocal  Psych: Normal affect   Labs    Chemistry Recent Labs  Lab 05/23/18 1840 05/24/18 0815 05/25/18 0531  NA 130* 134* 138  K 4.3 3.5 4.0  CL 87* 90* 94*  CO2 30 32 35*  GLUCOSE 176* 104* 70  BUN 27* 24* 26*  CREATININE 1.26* 1.16* 1.18*  CALCIUM 8.8* 8.9 9.0  PROT  --   --  6.7  ALBUMIN  --   --  3.0*  AST  --   --  26  ALT  --   --  19  ALKPHOS  --   --  103  BILITOT  --   --  0.5  GFRNONAA 40* 45* 44*  GFRAA 47* 52* 51*  ANIONGAP 13 12 9      Hematology Recent Labs  Lab 05/21/18 1813 05/23/18 1840 05/24/18 0815  WBC 9.6 12.8* 9.8  RBC 3.96 3.71* 3.76*  HGB 9.8* 9.2* 9.3*  HCT 31.8* 30.6* 30.7*  MCV 80.3 82.5 81.6  MCH 24.7* 24.8* 24.7*  MCHC 30.8 30.1 30.3  RDW 15.5 15.9* 15.8*  PLT 445* 420* 433*    Cardiac EnzymesNo results for input(s): TROPONINI in the last 168 hours.  Recent Labs  Lab 05/21/18 1820 05/21/18 2153 05/23/18 1902  TROPIPOC 0.00 0.00 0.00     BNP Recent Labs  Lab 05/21/18 1813 05/23/18 1840  BNP 257.3* 196.3*     DDimer  Recent Labs  Lab 05/21/18 1813  DDIMER 0.41     Radiology    Dg Chest 2 View  Result Date: 05/23/2018 CLINICAL DATA:  Nausea, shortness of Breath EXAM: CHEST - 2 VIEW COMPARISON:  05/21/2018 FINDINGS: Cardiomegaly, vascular congestion and peribronchial thickening. Left pacer remains in place, unchanged. No confluent opacities or effusions. No acute bony abnormality. IMPRESSION: Cardiomegaly, vascular congestion. Mild bronchitic changes. Electronically Signed   By: Rolm Baptise M.D.   On: 05/23/2018 19:55    Cardiac Studies   Cath 02/04/2015 1. Mid RCA lesion, 40% stenosed. 2. 1st RPLB lesion, 90% stenosed. 3. 2nd Mrg lesion,  35% stenosed. 4. Prox LAD lesion, 50% stenosed. 5. Dist LAD lesion, 80% stenosed.    Diffuse, intramyocardial distal LAD with up to 80% stenosis is noted.  The mid RCA, proximal LAD, and second obtuse marginal contains less than 50% narrowing.  The small PDA/posterolateral branch contains ostial 80-90% narrowing  Normal left ventricular function with EF estimated at 60%   RECOMMENDATIONS:  Medical management. No lesions are suitable for PCI. Chest discomfort should be treated with nitroglycerin. Aggressive risk factor modification.    Echo 923/2019 LV EF: 40% -   45% Study Conclusions  - Left ventricle: The cavity size was normal. Wall thickness was   increased in a pattern of mild LVH. Systolic function was mildly   to moderately reduced. The estimated ejection fraction was in the   range of 40% to 45%. Diffuse hypokinesis. - Aortic valve: Mildly calcified annulus. There was very mild   stenosis. Valve area (VTI): 2.07 cm^2. Valve area (Vmax): 2.31   cm^2. Valve area (Vmean): 2.09 cm^2. - Mitral valve: Moderately calcified annulus. Mildly thickened   leaflets . Valve area by continuity equation (using LVOT flow):   2.05 cm^2. - Left atrium: The atrium was mildly dilated. - Tricuspid valve: There was mild-moderate regurgitation. - Pulmonary arteries: Systolic pressure was severely increased. PA   peak pressure: 74 mm Hg (S).  Patient Profile     77 y.o. female with a hx of chronic diastolic CHF, former tobacco abuse, diffuse CAD by cath 2016 (treated medically), tachybrady syndrome s/p St Jude PPM, carotid artery disease, atrial tachycardia on flecainide, paroxysmal atrial fib (per EP notes), colon CA s/p chemo, prior DVT, OSA, glaucoma, HTN, HLD, hypothyroidism, IDA, multinodular goiter, sleep apnea, morbid obesity s/p weight loss surgery, DM, CKD III who is being seen today for the evaluation of CHF and decreased EF at the request of [place]  Assessment & Plan     1. Acute systolic heart failure  - I/O 3L, weight down 2.5 lbs. Remain volume overloaded.  - continue diuresis. Watch BP, may need to scale back diuresis in order to avoid hypotension.   - Echo showed new LV dysfunction, cannot rule wall motion abnormality, plan to recheck limited echo with definity.   - 2 episodes of chest pain recently, both occurred on last Friday, first episode was a fleeting chest pain lasted a second, second episode lasted less  than 10 min and resolved with single sublingual nitro. No obvious symptom recently to suggest progressive angina.  - if wall motion on echo is ok or diffuse hypokinesis without regional variation, would continue medical therapy and uptitrate HF regimen after diuresis and recheck echo in 3 month, potentially consider cath only if EF remain low at that time.   2. CAD: last cath 02/04/2015 40% mid RCA, 90% 1st RPLB, 35% OM2, 50% prox LAD, 80% distal LAD.   3. Tachybrady syndrome s/p St Jude PPM: followed by Dr. Rayann Heman, paced rhythm  4. Carotid artery disease  5. Atrial tachycardia on flecainide: will curb side EP to see if she can still continue flecainide with LV dysfunction.   6. PAF: on eliquis, low dose flecainide.       For questions or updates, please contact [place] Please consult [email or web address] for contact info under        Signed, Almyra Deforest, [place]  05/25/2018, 11:39 AM   ------------------------------------------------------------- History and all data above reviewed.  Patient examined.  I agree with the findings as above.    Mary [name] is a pleasant 77 year old female currently admitted for acute hypoxic respiratory failure, likely multifactorial. She has been seen in consultation by my colleagues Melina Copa and Dr. Candee Furbish. She had cold-like symptoms approximately a week and a half ago, and subsequently felt that she was improving however had recurrent upper respiratory symptoms over the weekend.  After one episode of  bloody phlegm, her husband convinced her to present for evaluation.  She was commenced on treatment for CHF with diuresis, acute bronchitis with doxycycline and nebulizers.   She is a primary cardiology patient of Dr. Daneen Schick and sees Dr. [name] for EP.  She underwent coronary angiography in 2016 with diffuse coronary artery disease and no severe stenoses.  She has been medically managed for coronary disease, and her most recent echocardiogram prior to this admission was December 2017 demonstrating a preserved ejection fraction, with no obvious regional wall motion abnormality.  On presentation, her left ventricular function had visibly decreased.  After diuresis and respiratory cares, she feels much better.  She continues to wheeze and is not at baseline, however is making progress.  General: The patient is in no acute distress, sitting up comfortably in the bedside chair with supplemental oxygen, able to converse in full sentences. Cardiovascular: regular rhythm, normal rate. Heart sounds distant, obscured by wheezing. Lungs: Coarse crackles bilaterally in all lung fields, with diffuse expiratory and inspiratory wheezes. Abdomen: Soft MSK: 2+ pitting edema bilaterally to above the knee.  All available labs, radiology testing, previous records reviewed. Agree with documented assessment and plan.  We will continue diuresis with a careful eye towards her blood pressure which has been slightly low, however she has been having adequate urine output and mentation, and has improved greatly with diuresis from a symptom standpoint.  Given her newly reduced ejection fraction and documented coronary disease, we will stop her flecainide.  This has been reviewed with our electrophysiology colleagues, and can be discussed as an outpatient with her primary electrophysiologist.  To further clarify the nature of her reduced ejection fraction, which is new to this hospitalization, I have recommended a  limited echocardiogram with Definity intravenous contrast.  My specific question is regional wall motion assessment and better characterization of the ejection fraction if biplane volumes are able to be obtained.  This may help [place] better understand the nature of her heart failure exacerbation, and whether  there is a possibility of more significant coronary disease requiring our attention.  All of this has been explained to Mary [name], and I answered her questions to the best of my ability.  She had no additional concerns or questions at the end of our visit.  She will await limited echocardiography and continued diuresis.  Elouise Munroe  4:07 PM  05/25/2018

## 2018-05-25 NOTE — Progress Notes (Signed)
Hypoglycemic Event  CBG: 59  Treatment: 15 GM carbohydrate snack  Symptoms: None  Follow-up CBG: Time:15 CBG Result:97  Possible Reasons for Event: Unknown    Mary [name]

## 2018-05-25 NOTE — Progress Notes (Signed)
Inpatient Diabetes Program Recommendations  AACE/ADA: New Consensus Statement on Inpatient Glycemic Control (2015)  Target Ranges:  Prepandial:   less than 140 mg/dL      Peak postprandial:   less than 180 mg/dL (1-2 hours)      Critically ill patients:  140 - 180 mg/dL   Lab Results  Component Value Date   GLUCAP 156 (H) 05/25/2018   HGBA1C 9.9 (H) 05/24/2018    Review of Glycemic Control  Diabetes history: DM2 Outpatient Diabetes medications: Lantus 64 units QD Current orders for Inpatient glycemic control: Lantus 64 units QD, Novolog 0-20 units tidwc and hs  HgbA1C - 9.9%  Hypoglycemia this am -   Inpatient Diabetes Program Recommendations:    Decrease Lantus to 58 units QD  To speak with pt regarding HgbA1C of 9.9%.  Will continue to follow.  Thank you. Lorenda Peck, RD, LDN, CDE Inpatient Diabetes Coordinator [phone]

## 2018-05-25 NOTE — Progress Notes (Signed)
PROGRESS NOTE    Mary [name]  RFF:[id number] DOB: 1941/02/27 DOA: 05/23/2018 PCP: Hoyt Koch, MD  Brief Narrative:Mary [name] is a 77 y.o. female with medical history significant of A. fib, CHF, type 2 diabetes, presents with shortness of breath.  Patient was admitted a few days ago to [facility] for CHF exacerbation.  She was upset because she was unable to see cardiology there and left AMA yesterday.  Today she continued to have orthopnea, slight cough and shortness of breath.  Patient had a chest x-ray did show some vascular congestion .she was given 80 mg of IV Lasix in ED.  She had urine output x1.  Patient denies any known history of COPD but has a long history of tobacco use.  sHe initially became ill with cold-like symptoms 2 weeks ago.  She got better for short time but cough and orthopnea resumed .  Her lower extremities have been increasing in its swelling over the past week.  States she is compliant with her medications and follows with a local cardiologist here.  Patient denies any chest pain.     ED Course: Patient had a chest x-ray which showed show some vascular  Congestion.she was given 80 mg of IV Lasix in ED.    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 Chest x ray cardiomegaly, vascular congestion.  Takes 80 mg oral lasix at home.  Continue with 40 mg IV lasix BID.  New oxygen requirement.  Weight; 273---269--270 Cardiology consulted, patient request.  Continue to have LE edema, crackles lung. Continue with lasix.   2-Acute Bronchitis;  COPD exacerbation  Used to smoke.  Bilateral ronchus.  Schedule nebulizer. Change back to Q 6 hours.  Needs PFT outpatient.  Continue with prednisone.  Doxy 5 days.   3-Acute hypoxic respiratory failure;  In setting on HF exacerbation and COPD.  IV lasix. Nebulizer.  New oxygen requirement, currently on 3 L oxygen.  Taper oxygen down.   4-A fib; chronic;  On flecainide and eliquis. Continue.   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  Check iron level.   6-CKD stage III Baseline 1.1-1.2  Monitor on lasix.   DM; continue with lantus. Decrease lantus to 58 units due to hypoglycemia today   DVT prophylaxis: Eliquis Code Status: full code.  Family Communication: care discussed with patient.  Disposition Plan: Remain in patient for IV lasix. New oxygen requirement.   Consultants:   Cardiology    Procedures:   none   Antimicrobials: doxy    Subjective: She is breathing better, not at baseline.  Having wheezing. Still with LE edema  Objective: Vitals:   05/25/18 0401 05/25/18 0628 05/25/18 0700 05/25/18 0817  BP: (!) 86/42 (!) 89/71 94/61   Pulse: 73 70 72   Resp: 18  14   Temp: 98.4 F (36.9 C)  97.7 F (36.5 C)   TempSrc: Oral  Oral   SpO2: 98%  98% 94%  Weight: 122.7 kg     Height:  Intake/Output Summary (Last 24 hours) at 05/25/2018 1126 Last data filed at 05/25/2018 1100 Gross per 24 hour  Intake 2216 ml  Output 3200 ml  Net -984 ml   Filed Weights   05/24/18 0222 05/24/18 0431 05/25/18 0401  Weight: 123.6 kg 122.1 kg 122.7 kg    Examination:  General exam: NAD.  Respiratory system;Bilateral wheezing and crackles.  Cardiovascular system: S 1, S 2 RRR Gastrointestinal system: BS present, soft, nt Central nervous system; non focal.  Extremities: plus 2 edema Skin: no rashes    Data Reviewed: I have personally reviewed following labs and imaging studies  CBC: Recent Labs  Lab 05/21/18 1813  05/23/18 1840 05/24/18 0815  WBC 9.6 12.8* 9.8  NEUTROABS 6.6  --   --   HGB 9.8* 9.2* 9.3*  HCT 31.8* 30.6* 30.7*  MCV 80.3 82.5 81.6  PLT 445* 420* 364*   Basic Metabolic Panel: Recent Labs  Lab 05/21/18 1813 05/22/18 0535 05/23/18 1840 05/24/18 0815 05/25/18 0531  NA 132* 128* 130* 134* 138  K 3.3* 4.7 4.3 3.5 4.0  CL 87* 87* 87* 90* 94*  CO2 31 31 30  32 35*  GLUCOSE 101* 378* 176* 104* 70  BUN 19 23 27* 24* 26*  CREATININE 1.08* 1.23* 1.26* 1.16* 1.18*  CALCIUM 9.2 8.5* 8.8* 8.9 9.0   GFR: Estimated Creatinine Clearance: 55.1 mL/min (A) (by C-G formula based on SCr of 1.18 mg/dL (H)). Liver Function Tests: Recent Labs  Lab 05/25/18 0531  AST 26  ALT 19  ALKPHOS 103  BILITOT 0.5  PROT 6.7  ALBUMIN 3.0*   No results for input(s): LIPASE, AMYLASE in the last 168 hours. No results for input(s): AMMONIA in the last 168 hours. Coagulation Profile: No results for input(s): INR, PROTIME in the last 168 hours. Cardiac Enzymes: No results for input(s): CKTOTAL, CKMB, CKMBINDEX, TROPONINI in the last 168 hours. BNP (last 3 results) No results for input(s): PROBNP in the last 8760 hours. HbA1C: Recent Labs    05/24/18 0231  HGBA1C 9.9*   CBG: Recent Labs  Lab 05/24/18 1156 05/24/18 1628 05/24/18 2122 05/25/18 0730 05/25/18 0810  GLUCAP 279* 227* 128* 59* 97   Lipid Profile: Recent Labs    05/25/18 0531  CHOL 124  HDL 35*  LDLCALC 73  TRIG 81  CHOLHDL 3.5   Thyroid Function Tests: No results for input(s): TSH, T4TOTAL, FREET4, T3FREE, THYROIDAB in the last 72 hours. Anemia Panel: No results for input(s): VITAMINB12, FOLATE, FERRITIN, TIBC, IRON, RETICCTPCT in the last 72 hours. Sepsis Labs: Recent Labs  Lab 05/22/18 1512  PROCALCITON <0.10    No results found for this or any previous visit (from the past 240 hour(s)).       Radiology Studies: Dg Chest 2 View  Result Date: 05/23/2018 CLINICAL DATA:  Nausea, shortness of Breath EXAM:  CHEST - 2 VIEW COMPARISON:  05/21/2018 FINDINGS: Cardiomegaly, vascular congestion and peribronchial thickening. Left pacer remains in place, unchanged. No confluent opacities or effusions. No acute bony abnormality. IMPRESSION: Cardiomegaly, vascular congestion. Mild bronchitic changes. Electronically Signed   By: Rolm Baptise M.D.   On: 05/23/2018 19:55        Scheduled Meds: . apixaban  5 mg Oral BID  . arformoterol  15 mcg Nebulization BID  . atorvastatin  80 mg Oral q1800  . buPROPion  150 mg Oral Daily  . doxycycline  100 mg Oral Q12H  . flecainide  75 mg Oral BID  . furosemide  40 mg  Intravenous Q12H  . gabapentin  300 mg Oral QHS  . insulin aspart  0-20 Units Subcutaneous TID WC  . insulin aspart  0-5 Units Subcutaneous QHS  . insulin glargine  64 Units Subcutaneous Daily  . ipratropium-albuterol  3 mL Nebulization BID  . latanoprost  1 drop Right Eye QHS  . levothyroxine  125 mcg Oral QAC breakfast  . metoprolol succinate  12.5 mg Oral QHS  . potassium chloride SA  40 mEq Oral Daily  . predniSONE  20 mg Oral Q breakfast  . sodium chloride flush  3 mL Intravenous Q12H   Continuous Infusions: . sodium chloride       LOS: 1 day    Time spent: 35 minutes.     Elmarie Shiley, MD [facility] Pager [phone]  If 7PM-7AM, please contact night-coverage [email or web address] Password [email or web address] 05/25/2018, 11:26 AM

## 2018-05-26 ENCOUNTER — Inpatient Hospital Stay (HOSPITAL_COMMUNITY): Payer: Medicare Other

## 2018-05-26 DIAGNOSIS — R0600 Dyspnea, unspecified: Secondary | ICD-10-CM

## 2018-05-26 LAB — BASIC METABOLIC PANEL
Anion gap: 12 (ref 5–15)
BUN: 28 mg/dL — AB (ref 8–23)
CHLORIDE: 91 mmol/L — AB (ref 98–111)
CO2: 36 mmol/L — ABNORMAL HIGH (ref 22–32)
CREATININE: 1.14 mg/dL — AB (ref 0.44–1.00)
Calcium: 9.4 mg/dL (ref 8.9–10.3)
GFR, EST AFRICAN AMERICAN: 53 mL/min — AB (ref >60)
GFR, EST NON AFRICAN AMERICAN: 46 mL/min — AB (ref >60)
Glucose, Bld: 100 mg/dL — ABNORMAL HIGH (ref 70–99)
Potassium: 4 mmol/L (ref 3.5–5.1)
SODIUM: 139 mmol/L (ref 135–145)

## 2018-05-26 LAB — CBC
HCT: 31.2 % — ABNORMAL LOW (ref 36.0–46.0)
HEMOGLOBIN: 9.2 g/dL — AB (ref 12.0–15.0)
MCH: 24.4 pg — ABNORMAL LOW (ref 26.0–34.0)
MCHC: 29.5 g/dL — AB (ref 30.0–36.0)
MCV: 82.8 fL (ref 78.0–100.0)
PLATELETS: 461 10*3/uL — AB (ref 150–400)
RBC: 3.77 MIL/uL — AB (ref 3.87–5.11)
RDW: 15.9 % — ABNORMAL HIGH (ref 11.5–15.5)
WBC: 11.1 10*3/uL — ABNORMAL HIGH (ref 4.0–10.5)

## 2018-05-26 LAB — IRON AND TIBC
Iron: 17 ug/dL — ABNORMAL LOW (ref 28–170)
Saturation Ratios: 3 % — ABNORMAL LOW (ref 10.4–31.8)
TIBC: 493 ug/dL — AB (ref 250–450)
UIBC: 476 ug/dL

## 2018-05-26 LAB — GLUCOSE, CAPILLARY
GLUCOSE-CAPILLARY: 98 mg/dL (ref 70–99)
GLUCOSE-CAPILLARY: 194 mg/dL — AB (ref 70–99)
GLUCOSE-CAPILLARY: 175 mg/dL — AB (ref 70–99)
Glucose-Capillary: 242 mg/dL — ABNORMAL HIGH (ref 70–99)

## 2018-05-26 LAB — FERRITIN: FERRITIN: 11 ng/mL (ref 11–307)

## 2018-05-26 LAB — ECHOCARDIOGRAM LIMITED
Height: 67 in
WEIGHTICAEL: 4308.8 [oz_av]

## 2018-05-26 MED ORDER — APIXABAN 5 MG PO TABS
5.0000 mg | ORAL_TABLET | Freq: Two times a day (BID) | ORAL | Status: AC
  Administered 2018-05-26 – 2018-05-27 (×3): 5 mg via ORAL
  Filled 2018-05-26 (×3): qty 1

## 2018-05-26 MED ORDER — PERFLUTREN LIPID MICROSPHERE
1.0000 mL | INTRAVENOUS | Status: AC | PRN
  Administered 2018-05-26: 2 mL via INTRAVENOUS
  Filled 2018-05-26: qty 10

## 2018-05-26 MED ORDER — SODIUM CHLORIDE 0.9% FLUSH: 3.0000 mL | INTRAVENOUS | Status: DC | PRN

## 2018-05-26 MED ORDER — IPRATROPIUM-ALBUTEROL 0.5-2.5 (3) MG/3ML IN SOLN
3.0000 mL | Freq: Three times a day (TID) | RESPIRATORY_TRACT | Status: DC
  Administered 2018-05-26 – 2018-05-28 (×6): 3 mL via RESPIRATORY_TRACT
  Filled 2018-05-26 (×7): qty 3

## 2018-05-26 MED ORDER — SODIUM CHLORIDE 0.9 % IV SOLN: 250.0000 mL | INTRAVENOUS | Status: DC | PRN

## 2018-05-26 MED ORDER — SODIUM CHLORIDE 0.9 % IV SOLN: INTRAVENOUS | Status: DC

## 2018-05-26 MED ORDER — SODIUM CHLORIDE 0.9 % IV SOLN
510.0000 mg | Freq: Once | INTRAVENOUS | Status: AC
  Administered 2018-05-27: 510 mg via INTRAVENOUS
  Filled 2018-05-26: qty 17

## 2018-05-26 MED ORDER — SODIUM CHLORIDE 0.9 % IV SOLN
INTRAVENOUS | Status: DC
  Administered 2018-05-26: 16:00:00 via INTRAVENOUS

## 2018-05-26 MED ORDER — INSULIN GLARGINE 100 UNIT/ML ~~LOC~~ SOLN: 45.0000 [IU] | Freq: Every day | SUBCUTANEOUS | Status: DC

## 2018-05-26 MED ORDER — SODIUM CHLORIDE 0.9% FLUSH
3.0000 mL | Freq: Two times a day (BID) | INTRAVENOUS | Status: DC
  Administered 2018-05-26 – 2018-05-28 (×4): 3 mL via INTRAVENOUS

## 2018-05-26 NOTE — Progress Notes (Signed)
  Was informed by Dr. Ellyn Hack that Datto that was planned for this afternoon will have to be rescheduled for Monday, due to scheduling conflict in the cath lab due to more urgent cases. Will inform pt and will order heart healthy/ carb modified diet. Order also placed to resume Eliquis 5 mg BID tonight, for only 3 doses. She will need to hold doses starting morning of 9/29 for cath on Monday. Last Eliquis dose before cath will be Saturday evening at 2200.   Lyda Jester, PA-C 05/26/2018

## 2018-05-26 NOTE — Progress Notes (Signed)
Patient is alert and oriented with no distress. Has tolerated lying flat for over 1 hr today, Plan to continue with a cath with poss stent today explained to patient that she may not come back to this room and they are prepared to take belongs and are all packed up. And ready for cath procedure.

## 2018-05-26 NOTE — Progress Notes (Signed)
Progress Note  Patient Name: Mary [name] Date of Encounter: 05/26/2018  Primary Cardiologist: Sinclair Grooms, MD  Electrophysiologist: Dr. Rayann Heman  Subjective   Mary [name] is a pleasant 77 year old female currently admitted for acute hypoxic respiratory failure, likely multifactorial.  After diuresis and bronchodilators she is feeling better today.  She went for a limited echocardiogram with Definity LV enhancement today, preliminary results were discussed with the patient.  Inpatient Medications    Scheduled Meds: . arformoterol  15 mcg Nebulization BID  . atorvastatin  80 mg Oral q1800  . buPROPion  150 mg Oral Daily  . doxycycline  100 mg Oral Q12H  . furosemide  40 mg Intravenous Q12H  . gabapentin  300 mg Oral QHS  . insulin aspart  0-20 Units Subcutaneous TID WC  . insulin aspart  0-5 Units Subcutaneous QHS  . insulin glargine  58 Units Subcutaneous Daily  . ipratropium-albuterol  3 mL Nebulization TID  . latanoprost  1 drop Right Eye QHS  . levothyroxine  125 mcg Oral QAC breakfast  . metoprolol succinate  12.5 mg Oral QHS  . potassium chloride SA  40 mEq Oral Daily  . predniSONE  20 mg Oral Q breakfast  . sodium chloride flush  3 mL Intravenous Q12H  . sodium chloride flush  3 mL Intravenous Q12H   Continuous Infusions: . sodium chloride    . sodium chloride    . sodium chloride     PRN Meds: sodium chloride, sodium chloride, acetaminophen, albuterol, HYDROcodone-acetaminophen, nitroGLYCERIN, ondansetron (ZOFRAN) IV, sodium chloride flush, sodium chloride flush   Vital Signs    Vitals:   05/26/18 0536 05/26/18 0544 05/26/18 0923 05/26/18 0924  BP:  106/67    Pulse:  70    Resp:      Temp:  97.8 F (36.6 C)    TempSrc:  Oral    SpO2:  96% 93% 93%  Weight: 122.2 kg     Height:        Intake/Output Summary (Last 24 hours) at 05/26/2018 1041 Last data filed at 05/26/2018 1019 Gross per 24 hour  Intake 963 ml  Output 3600 ml  Net -2637 ml    Filed Weights   05/24/18 0431 05/25/18 0401 05/26/18 0536  Weight: 122.1 kg 122.7 kg 122.2 kg    Telemetry    Paced rhythm - Personally Reviewed  ECG    No ECG performed today.  Physical Exam   General: The patient is in no acute distress, sitting up comfortably in the bedside chair without supplemental oxygen, able to converse in full sentences. Cardiovascular: regular rhythm, normal rate. Heart sounds distant, obscured by coarse breath sounds. Radial pulse 3/4 right radial, 2/4 left radial. Lungs: rhonchi bilaterally with scattered end expiratory wheeze. Abdomen: Soft MSK: 1+ pitting edema bilaterally to the knee, improved.  Labs    Chemistry Recent Labs  Lab 05/24/18 0815 05/25/18 0531 05/26/18 0701  NA 134* 138 139  K 3.5 4.0 4.0  CL 90* 94* 91*  CO2 32 35* 36*  GLUCOSE 104* 70 100*  BUN 24* 26* 28*  CREATININE 1.16* 1.18* 1.14*  CALCIUM 8.9 9.0 9.4  PROT  --  6.7  --   ALBUMIN  --  3.0*  --   AST  --  26  --   ALT  --  19  --   ALKPHOS  --  103  --   BILITOT  --  0.5  --   [facility]  45* 44* 46*  GFRAA 52* 51* 53*  ANIONGAP 12 9 12      Hematology Recent Labs  Lab 05/23/18 1840 05/24/18 0815 05/26/18 0701  WBC 12.8* 9.8 11.1*  RBC 3.71* 3.76* 3.77*  HGB 9.2* 9.3* 9.2*  HCT 30.6* 30.7* 31.2*  MCV 82.5 81.6 82.8  MCH 24.8* 24.7* 24.4*  MCHC 30.1 30.3 29.5*  RDW 15.9* 15.8* 15.9*  PLT 420* 433* 461*    Cardiac EnzymesNo results for input(s): TROPONINI in the last 168 hours.  Recent Labs  Lab 05/21/18 1820 05/21/18 2153 05/23/18 1902  TROPIPOC 0.00 0.00 0.00     BNP Recent Labs  Lab 05/21/18 1813 05/23/18 1840  BNP 257.3* 196.3*     DDimer  Recent Labs  Lab 05/21/18 1813  DDIMER 0.41     Radiology    No results found.  Cardiac Studies   Cath 02/04/2015 1. Mid RCA lesion, 40% stenosed. 2. 1st RPLB lesion, 90% stenosed. 3. 2nd Mrg lesion, 35% stenosed. 4. Prox LAD lesion, 50% stenosed. 5. Dist LAD lesion, 80%  stenosed.   Diffuse, intramyocardial distal LAD with up to 80% stenosis is noted. The mid RCA, proximal LAD, and second obtuse marginal contains less than 50% narrowing. The small PDA/posterolateral branch contains ostial 80-90% narrowing Normal left ventricular function with EF estimated at 60% RECOMMENDATIONS: Medical management. No lesions are suitable for PCI. Chest discomfort should be treated with nitroglycerin. Aggressive risk factor modification.  Echo 923/2019 LV EF: 40% -   45% Study Conclusions  - Left ventricle: The cavity size was normal. Wall thickness was   increased in a pattern of mild LVH. Systolic function was mildly   to moderately reduced. The estimated ejection fraction was in the   range of 40% to 45%. Diffuse hypokinesis. - Aortic valve: Mildly calcified annulus. There was very mild   stenosis. Valve area (VTI): 2.07 cm^2. Valve area (Vmax): 2.31   cm^2. Valve area (Vmean): 2.09 cm^2. - Mitral valve: Moderately calcified annulus. Mildly thickened   leaflets . Valve area by continuity equation (using LVOT flow):   2.05 cm^2. - Left atrium: The atrium was mildly dilated. - Tricuspid valve: There was mild-moderate regurgitation. - Pulmonary arteries: Systolic pressure was severely increased. PA   peak pressure: 74 mm Hg (S).  The limited echocardiogram performed today with Definity LV enhancement report is pending. Patient Profile     77 y.o. female with a hx of chronic diastolic CHF, former tobacco abuse, diffuse CAD by cath 2016 (treated medically), tachybrady syndrome s/p St Jude PPM, carotid artery disease, atrial tachycardia on flecainide, paroxysmal atrial fib (per EP notes), colon CA s/p chemo, prior DVT, OSA, glaucoma, HTN, HLD, hypothyroidism, IDA, multinodular goiter, sleep apnea, morbid obesity s/p weight loss surgery, DM, CKD III admitted for multifactorial dyspnea.  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 Acute systolic and diastolic heart failure My preliminary interpretation of the limited echocardiogram prior to formal review is that there is apical, septal and anterior mid to distal severe hypokinesis, concerning for LAD distribution ischemia or infarct.  In light of her known coronary artery disease, heart failure exacerbation, and reduced ejection fraction, I would recommend  coronary angiography to determine progression of disease corresponding to new wall motion abnormality.  Her radial pulses are adequate for consideration for radial access for cath.   2. CAD: last cath 02/04/2015 40% mid RCA, 90% 1st RPLB, 35% OM2, 50% prox LAD, 80% distal LAD. Will pursue coronary angiography today to determine nature of likely new wall motion abnormalities.  Coronary angiography was explained in detail to the patient. Risks and benefits of coronary angiography with possible percutaneous coronary intervention were discussed with the patient and she demonstrated understanding. The patient understands that risks included but are not limited to stroke (1 in 1000), death (1 in 78), kidney failure [usually temporary] (1 in 500), bleeding (1 in 200), allergic reaction [possibly serious] (1 in 200).   3. Tachybrady syndrome s/p St Jude PPM: followed by Dr. Rayann Heman, paced rhythm.   4. Carotid artery disease  5. Atrial tachycardia and PAF: Flecainide discontinued in light of coronary disease and reduced ejection fraction.  Patient is chronically anticoagulated with Eliquis, this will be held in anticipation of coronary angiography.    For questions or updates, please contact [place] Please consult [email or web address] for contact info under       Signed, Elouise Munroe, MD  05/26/2018, 10:41 AM

## 2018-05-26 NOTE — Plan of Care (Signed)
  Problem: Education: Goal: Knowledge of General Education information will improve Description Including pain rating scale, medication(s)/side effects and non-pharmacologic comfort measures Outcome: Progressing   Problem: Health Behavior/Discharge Planning: Goal: Ability to manage health-related needs will improve Outcome: Progressing   Problem: Clinical Measurements: Goal: Ability to maintain clinical measurements within normal limits will improve Outcome: Progressing Goal: Will remain free from infection Outcome: Progressing Goal: Diagnostic test results will improve Outcome: Progressing Goal: Respiratory complications will improve Outcome: Progressing Goal: Cardiovascular complication will be avoided Outcome: Progressing   Problem: Clinical Measurements: Goal: Will remain free from infection Outcome: Progressing   Problem: Clinical Measurements: Goal: Diagnostic test results will improve Outcome: Progressing   Problem: Clinical Measurements: Goal: Respiratory complications will improve Outcome: Progressing   Problem: Clinical Measurements: Goal: Cardiovascular complication will be avoided Outcome: Progressing   Problem: Activity: Goal: Risk for activity intolerance will decrease Outcome: Progressing   Problem: Nutrition: Goal: Adequate nutrition will be maintained Outcome: Progressing   Problem: Coping: Goal: Level of anxiety will decrease Outcome: Progressing   Problem: Elimination: Goal: Will not experience complications related to bowel motility Outcome: Progressing Goal: Will not experience complications related to urinary retention Outcome: Progressing   Problem: Pain Managment: Goal: General experience of comfort will improve Outcome: Progressing   Problem: Safety: Goal: Ability to remain free from injury will improve Outcome: Progressing   Problem: Skin Integrity: Goal: Risk for impaired skin integrity will decrease Outcome: Progressing    Problem: Education: Goal: Ability to demonstrate management of disease process will improve Outcome: Progressing Goal: Ability to verbalize understanding of medication therapies will improve Outcome: Progressing Goal: Individualized Educational Video(s) Outcome: Progressing   Problem: Activity: Goal: Capacity to carry out activities will improve Outcome: Progressing   Problem: Cardiac: Goal: Ability to achieve and maintain adequate cardiopulmonary perfusion will improve Outcome: Progressing

## 2018-05-26 NOTE — Progress Notes (Signed)
  Echocardiogram 2D Echocardiogram limited with Definity has been performed.  Jennette Dubin 05/26/2018, 9:04 AM

## 2018-05-26 NOTE — Progress Notes (Signed)
Rounding with MD plan to have a Cath and  check for bed and lying flat tolerance.

## 2018-05-26 NOTE — Progress Notes (Signed)
PT Cancellation Note  Patient Details Name: Mary [name] MRN: [id number] DOB: November 30, 1940   Cancelled Treatment:    Reason Eval/Treat Not Completed: Patient declined, no reason specified attempted to work with patient this morning, she declined wishing to resume therapy after Cath procedure. Will follow.  Reinaldo Berber, PT, DPT Acute Rehabilitation Services Pager: [phone] Office: [phone]     Reinaldo Berber 05/26/2018, 4:19 PM

## 2018-05-26 NOTE — Progress Notes (Addendum)
PROGRESS NOTE    Mary [name]  QBH:[id number] DOB: [birth year] DOA: 05/23/2018 PCP: Hoyt Koch, MD  Brief Narrative:Mary [name] is a 77 y.o. female with medical history significant of A. fib, CHF, type 2 diabetes, presents with shortness of breath.  Patient was admitted a few days ago to [facility] for CHF exacerbation.  She was upset because she was unable to see cardiology there and left AMA yesterday.  Today she continued to have orthopnea, slight cough and shortness of breath.  Patient had a chest x-ray did show some vascular congestion .she was given 80 mg of IV Lasix in ED.  She had urine output x1.  Patient denies any known history of COPD but has a long history of tobacco use.  sHe initially became ill with cold-like symptoms 2 weeks ago.  She got better for short time but cough and orthopnea resumed .  Her lower extremities have been increasing in its swelling over the past week.  States she is compliant with her medications and follows with a local cardiologist here.  Patient denies any chest pain.     ED Course: Patient had a chest x-ray which showed show some vascular  Congestion.she was given 80 mg of IV Lasix in ED.    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 Chest x ray cardiomegaly, vascular congestion.  Takes 80 mg oral lasix at home.  Continue with 40 mg IV lasix BID.  New oxygen requirement. tapering oxygen off.  Weight; 273---269--270--269 Cardiology consulted.  ECHO with wall motion abnormalities. Plan for cardiac cath today.   2-Acute Bronchitis; COPD  exacerbation  Used to smoke.  Schedule nebulizer. Change back to Q 6 hours.  Needs PFT outpatient.  Continue with prednisone for 5 days.   Doxy 5 days.   3-Acute hypoxic respiratory failure;  In setting on HF exacerbation and COPD.  IV lasix. Nebulizer.  New oxygen requirement. Was on 3 L. Now on Room air.    4-A fib; chronic;   eliquis. Flecainide discontinue.   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   iron level. Iron low. Plan to give IV iron tomorrow.   6-CKD stage III Baseline 1.1-1.2  Monitor on lasix.   DM; continue with lantus. Change to be give at HS.  Decrease dose due to hypoglycemia   DVT prophylaxis: Eliquis Code Status: full code.  Family Communication: care discussed with patient.  Disposition Plan: Remain in patient for IV lasix. New oxygen requirement.   Consultants:   Cardiology    Procedures:   none   Antimicrobials: doxy    Subjective: Dyspnea improving. Denies chest pain. Agree to have  cath done  Objective: Vitals:   05/26/18 0544 05/26/18 0923 05/26/18 0924 05/26/18 1454  BP: 106/67     Pulse: 70     Resp:      Temp: 97.8 F (36.6 C)     TempSrc: Oral     SpO2: 96% 93% 93% 93%  Weight:      Height:  Intake/Output Summary (Last 24 hours) at 05/26/2018 1506 Last data filed at 05/26/2018 1114 Gross per 24 hour  Intake 483 ml  Output 3700 ml  Net -3217 ml   Filed Weights   05/24/18 0431 05/25/18 0401 05/26/18 0536  Weight: 122.1 kg 122.7 kg 122.2 kg    Examination:  General exam: NAD Respiratory system; less wheezing. Normal respiratory effort.  Cardiovascular system: S 1, S 2 RRR Gastrointestinal system: BS present, soft, nt, nd Central nervous system; Non focal.  Extremities: less edema lower extremities.  Skin: no rashes.     Data Reviewed: I have personally reviewed following labs and imaging studies  CBC: Recent Labs  Lab 05/21/18 1813 05/23/18 1840  05/24/18 0815 05/26/18 0701  WBC 9.6 12.8* 9.8 11.1*  NEUTROABS 6.6  --   --   --   HGB 9.8* 9.2* 9.3* 9.2*  HCT 31.8* 30.6* 30.7* 31.2*  MCV 80.3 82.5 81.6 82.8  PLT 445* 420* 433* 161*   Basic Metabolic Panel: Recent Labs  Lab 05/22/18 0535 05/23/18 1840 05/24/18 0815 05/25/18 0531 05/26/18 0701  NA 128* 130* 134* 138 139  K 4.7 4.3 3.5 4.0 4.0  CL 87* 87* 90* 94* 91*  CO2 31 30 32 35* 36*  GLUCOSE 378* 176* 104* 70 100*  BUN 23 27* 24* 26* 28*  CREATININE 1.23* 1.26* 1.16* 1.18* 1.14*  CALCIUM 8.5* 8.8* 8.9 9.0 9.4   GFR: Estimated Creatinine Clearance: 56.9 mL/min (A) (by C-G formula based on SCr of 1.14 mg/dL (H)). Liver Function Tests: Recent Labs  Lab 05/25/18 0531  AST 26  ALT 19  ALKPHOS 103  BILITOT 0.5  PROT 6.7  ALBUMIN 3.0*   No results for input(s): LIPASE, AMYLASE in the last 168 hours. No results for input(s): AMMONIA in the last 168 hours. Coagulation Profile: No results for input(s): INR, PROTIME in the last 168 hours. Cardiac Enzymes: No results for input(s): CKTOTAL, CKMB, CKMBINDEX, TROPONINI in the last 168 hours. BNP (last 3 results) No results for input(s): PROBNP in the last 8760 hours. HbA1C: Recent Labs    05/24/18 0231  HGBA1C 9.9*   CBG: Recent Labs  Lab 05/25/18 1132 05/25/18 1558 05/25/18 2217 05/26/18 0731 05/26/18 1152  GLUCAP 156* 217* 241* 98 242*   Lipid Profile: Recent Labs    05/25/18 0531  CHOL 124  HDL 35*  LDLCALC 73  TRIG 81  CHOLHDL 3.5   Thyroid Function Tests: No results for input(s): TSH, T4TOTAL, FREET4, T3FREE, THYROIDAB in the last 72 hours. Anemia Panel: Recent Labs    05/26/18 0701  FERRITIN 11  TIBC 493*  IRON 17*   Sepsis Labs: Recent Labs  Lab 05/22/18 1512  PROCALCITON <0.10    No results found for this or any previous visit (from the past 240 hour(s)).       Radiology Studies: No results found.      Scheduled Meds: . arformoterol  15 mcg Nebulization BID  .  atorvastatin  80 mg Oral q1800  . buPROPion  150 mg Oral Daily  . doxycycline  100 mg Oral Q12H  . furosemide  40 mg Intravenous Q12H  . gabapentin  300 mg Oral QHS  . insulin aspart  0-20 Units Subcutaneous TID WC  . insulin aspart  0-5 Units Subcutaneous QHS  . [START ON 05/27/2018] insulin glargine  45 Units Subcutaneous QHS  . ipratropium-albuterol  3 mL Nebulization TID  . latanoprost  1 drop Right Eye QHS  . levothyroxine  125  mcg Oral QAC breakfast  . metoprolol succinate  12.5 mg Oral QHS  . potassium chloride SA  40 mEq Oral Daily  . predniSONE  20 mg Oral Q breakfast  . sodium chloride flush  3 mL Intravenous Q12H  . sodium chloride flush  3 mL Intravenous Q12H   Continuous Infusions: . sodium chloride    . sodium chloride    . sodium chloride       LOS: 2 days    Time spent: 35 minutes.     Elmarie Shiley, MD [facility] Pager [phone]  If 7PM-7AM, please contact night-coverage [email or web address] Password [facility] 05/26/2018, 3:06 PM

## 2018-05-26 NOTE — Plan of Care (Signed)
  Problem: Activity: Goal: Risk for activity intolerance will decrease Outcome: Progressing   Problem: Coping: Goal: Level of anxiety will decrease Outcome: Progressing   Problem: Activity: Goal: Capacity to carry out activities will improve Outcome: Progressing   Problem: Cardiac: Goal: Ability to achieve and maintain adequate cardiopulmonary perfusion will improve Outcome: Progressing

## 2018-05-26 NOTE — Plan of Care (Signed)
  Problem: Health Behavior/Discharge Planning: Goal: Ability to manage health-related needs will improve Outcome: Progressing   Problem: Education: Goal: Knowledge of General Education information will improve Description Including pain rating scale, medication(s)/side effects and non-pharmacologic comfort measures Outcome: Progressing   Problem: Clinical Measurements: Goal: Will remain free from infection Outcome: Progressing   Problem: Clinical Measurements: Goal: Respiratory complications will improve Outcome: Progressing   Problem: Education: Goal: Ability to demonstrate management of disease process will improve Outcome: Progressing

## 2018-05-26 NOTE — Progress Notes (Signed)
Spoke w patient at bedside, she declines HH. Encouraged her to work w PT and learn exercises that she can continue to do at home.

## 2018-05-27 DIAGNOSIS — I5043 Acute on chronic combined systolic (congestive) and diastolic (congestive) heart failure: Secondary | ICD-10-CM

## 2018-05-27 LAB — GLUCOSE, CAPILLARY
GLUCOSE-CAPILLARY: 141 mg/dL — AB (ref 70–99)
GLUCOSE-CAPILLARY: 210 mg/dL — AB (ref 70–99)
Glucose-Capillary: 141 mg/dL — ABNORMAL HIGH (ref 70–99)
Glucose-Capillary: 250 mg/dL — ABNORMAL HIGH (ref 70–99)

## 2018-05-27 LAB — BASIC METABOLIC PANEL
ANION GAP: 13 (ref 5–15)
BUN: 29 mg/dL — AB (ref 8–23)
CALCIUM: 9.4 mg/dL (ref 8.9–10.3)
CO2: 33 mmol/L — ABNORMAL HIGH (ref 22–32)
Chloride: 89 mmol/L — ABNORMAL LOW (ref 98–111)
Creatinine, Ser: 1.35 mg/dL — ABNORMAL HIGH (ref 0.44–1.00)
GFR calc Af Amer: 43 mL/min — ABNORMAL LOW (ref >60)
GFR, EST NON AFRICAN AMERICAN: 37 mL/min — AB (ref >60)
GLUCOSE: 138 mg/dL — AB (ref 70–99)
POTASSIUM: 4 mmol/L (ref 3.5–5.1)
Sodium: 135 mmol/L (ref 135–145)

## 2018-05-27 MED ORDER — MAGNESIUM HYDROXIDE 400 MG/5ML PO SUSP
30.0000 mL | Freq: Every day | ORAL | Status: DC | PRN
  Administered 2018-05-27: 30 mL via ORAL
  Filled 2018-05-27: qty 30

## 2018-05-27 MED ORDER — HEPARIN (PORCINE) IN NACL 100-0.45 UNIT/ML-% IJ SOLN
1300.0000 [IU]/h | INTRAMUSCULAR | Status: DC
  Administered 2018-05-28: 1200 [IU]/h via INTRAVENOUS
  Administered 2018-05-29: 1300 [IU]/h via INTRAVENOUS
  Filled 2018-05-27 (×2): qty 250

## 2018-05-27 NOTE — Progress Notes (Signed)
Progress Note  Patient Name: [name] Date of Encounter: 05/27/2018  Primary Cardiologist: Sinclair Grooms, MD   Subjective   No CP; dyspnea improving  Inpatient Medications    Scheduled Meds: . apixaban  5 mg Oral BID  . arformoterol  15 mcg Nebulization BID  . atorvastatin  80 mg Oral q1800  . buPROPion  150 mg Oral Daily  . doxycycline  100 mg Oral Q12H  . furosemide  40 mg Intravenous Q12H  . gabapentin  300 mg Oral QHS  . insulin aspart  0-20 Units Subcutaneous TID WC  . insulin aspart  0-5 Units Subcutaneous QHS  . insulin glargine  45 Units Subcutaneous QHS  . ipratropium-albuterol  3 mL Nebulization TID  . latanoprost  1 drop Right Eye QHS  . levothyroxine  125 mcg Oral QAC breakfast  . metoprolol succinate  12.5 mg Oral QHS  . potassium chloride SA  40 mEq Oral Daily  . predniSONE  20 mg Oral Q breakfast  . sodium chloride flush  3 mL Intravenous Q12H  . sodium chloride flush  3 mL Intravenous Q12H   Continuous Infusions: . sodium chloride    . sodium chloride    . sodium chloride Stopped (05/26/18 1730)  . ferumoxytol     PRN Meds: sodium chloride, sodium chloride, acetaminophen, albuterol, HYDROcodone-acetaminophen, nitroGLYCERIN, ondansetron (ZOFRAN) IV, sodium chloride flush, sodium chloride flush   Vital Signs    Vitals:   05/26/18 1523 05/26/18 2004 05/27/18 0100 05/27/18 0447  BP: 111/66 123/82 119/66 (!) 108/51  Pulse: 65 73 70 74  Resp: 18 18 18 18   Temp: 98.5 F (36.9 C) 98.4 F (36.9 C) 98.3 F (36.8 C) 98.2 F (36.8 C)  TempSrc: Oral Oral Oral Oral  SpO2: 100% 95% 94% 94%  Weight:    121.7 kg  Height:        Intake/Output Summary (Last 24 hours) at 05/27/2018 0857 Last data filed at 05/27/2018 0733 Gross per 24 hour  Intake 80.28 ml  Output 2400 ml  Net -2319.72 ml   Filed Weights   05/25/18 0401 05/26/18 0536 05/27/18 0447  Weight: 122.7 kg 122.2 kg 121.7 kg    Telemetry    AV paced - Personally  Reviewed  Physical Exam   GEN: No acute distress.  Obese Neck: No JVD Cardiac: RRR, no murmurs, rubs, or gallops.  Respiratory: Clear to auscultation bilaterally. GI: Soft, nontender, non-distended  MS: trace edema Neuro:  Nonfocal  Psych: Normal affect   Labs    Chemistry Recent Labs  Lab 05/25/18 0531 05/26/18 0701 05/27/18 0744  NA 138 139 135  K 4.0 4.0 4.0  CL 94* 91* 89*  CO2 35* 36* 33*  GLUCOSE 70 100* 138*  BUN 26* 28* 29*  CREATININE 1.18* 1.14* 1.35*  CALCIUM 9.0 9.4 9.4  PROT 6.7  --   --   ALBUMIN 3.0*  --   --   AST 26  --   --   ALT 19  --   --   ALKPHOS 103  --   --   BILITOT 0.5  --   --   GFRNONAA 44* 46* 37*  GFRAA 51* 53* 43*  ANIONGAP 9 12 13      Hematology Recent Labs  Lab 05/23/18 1840 05/24/18 0815 05/26/18 0701  WBC 12.8* 9.8 11.1*  RBC 3.71* 3.76* 3.77*  HGB 9.2* 9.3* 9.2*  HCT 30.6* 30.7* 31.2*  MCV 82.5 81.6 82.8  MCH 24.8*  24.7* 24.4*  MCHC 30.1 30.3 29.5*  RDW 15.9* 15.8* 15.9*  PLT 420* 433* 461*    Recent Labs  Lab 05/21/18 1820 05/21/18 2153 05/23/18 1902  TROPIPOC 0.00 0.00 0.00     BNP Recent Labs  Lab 05/21/18 1813 05/23/18 1840  BNP 257.3* 196.3*     DDimer  Recent Labs  Lab 05/21/18 1813  DDIMER 0.41       Patient Profile     77 y.o. female with a hx of chronic diastolic CHF, former tobacco abuse, diffuse CAD by cath 2016 (treated medically), tachybrady syndrome s/p St Jude PPM, carotid artery disease, atrial tachycardia on flecainide, paroxysmal atrial fib (per EP notes), colon CA s/p chemo, prior DVT, OSA, glaucoma, HTN, HLD, hypothyroidism, IDA, multinodular goiter, sleep apnea, morbid obesity s/p weight loss surgery, DM, CKD IIIwho is being seen today for the evaluation ofCHF.  Echocardiogram September 2019 showed ejection fraction 40 to 45%, mild aortic stenosis, mild left atrial enlargement, mild to moderate tricuspid regurgitation and severe pulmonary hypertension.  Follow-up  echocardiogram with [facility] showed ejection fraction 55 to 60% with hypokinesis of the apical septum.  Assessment & Plan    1 acute/chronic combined systolic/diastolic congestive heart failure-volume status is improving.  Will continue present dose of Lasix today. I/O -2019. Wt 121.7 kg.  We will begin to hold diuretics tomorrow in anticipation of cardiac catheterization.  She will need fluid restriction and low-sodium diet at home.  2 coronary artery disease-follow-up echocardiogram shows preserved LV function but there are new wall motion abnormalities.  Plan is for cardiac catheterization on Monday.  Continue statin.  Add aspirin 81 mg daily.  If no intervention required will discontinue aspirin given need for apixaban.  3 history of paroxysmal atrial fibrillation and atrial tachycardia-patient is AV paced today.  Flecainide discontinued given history of coronary artery disease.  Continue beta-blockade.  May need different antiarrhythmic in the future if atrial fibrillation recurs.  She is on apixaban which will be held beginning tomorrow for cardiac catheterization.  4 iron deficiency-noted on iron studies.  Management per primary care.  5 prior pacemaker  6 chronic stage III kidney disease-we will hold diuretics beginning tomorrow in anticipation of cardiac catheterization.  Follow renal function after procedure.  7 COPD- per primary care  For questions or updates, please contact [place] Please consult [email or web address] for contact info under        Signed, Kirk Ruths, MD  05/27/2018, 8:57 AM

## 2018-05-27 NOTE — Progress Notes (Signed)
ANTICOAGULATION CONSULT NOTE - Initial Consult  Pharmacy Consult for heparin Indication: chest pain/ACS  Allergies  Allergen Reactions  . Other Other (See Comments)    NO Blind Scopes with Naso Gastric tube.  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 Adhesive [Tape]     Tape - burns - pls use paper tape or elastic bandage  . Aspirin Other (See Comments)    S/P gastric bypass surgery, states her MD told her to not take Aspirin.  . Latex     Tape= burns  . Nsaids Other (See Comments)    S/P gastric bypass-told not to take. GI bleeds with NSAIDS    Patient Measurements: Height: 5\' 7"  (170.2 cm) Weight: 268 lb 3.2 oz (121.7 kg) IBW/kg (Calculated) : 61.6 HEPARIN DW (KG): 91   Vital Signs: Temp: 98 F (36.7 C) (09/28 1207) Temp Source: Oral (09/28 1207) BP: 93/80 (09/28 1207) Pulse Rate: 78 (09/28 1207)  Labs: Recent Labs    05/25/18 0531 05/26/18 0701 05/27/18 0744  HGB  --  9.2*  --   HCT  --  31.2*  --   PLT  --  461*  --   CREATININE 1.18* 1.14* 1.35*    Estimated Creatinine Clearance: 47.9 mL/min (A) (by C-G formula based on SCr of 1.35 mg/dL (H)).   Medical History: Past Medical History:  Diagnosis Date  . Arthritis   . Atrial tachycardia ([place]) 04/23/2017  . CAD (coronary artery disease)    non obstructive  . 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 Procedure complicated by an atrial lead dislodgment and repeat procedure, 12/2013   . Carotid stenosis    Carotid US 5/16:  Bilateral ICA 1-39%; L vertebral retrograde; L BP 126/49, R BP 140/57  . Chronic combined systolic and diastolic CHF (congestive heart failure) ([place])   . Chronic lower back pain   . Colon cancer ([place])    a. s/p chemo  . Colon polyps   . Diabetic peripheral neuropathy ([place])   . DVT (deep venous thrombosis) ([place]) 2013   "twice behind knee on left side" (06/25/2013)  . Glaucoma   . Hyperlipidemia   . Hypertension   . Hypothyroidism   .  Iron deficiency anemia   . Multinodular goiter   . Osteoporosis   . PAF (paroxysmal atrial fibrillation) ([place])   . Sleep apnea    a. resolved post weight loss   . Type II diabetes mellitus (HCC)      Assessment: 76 yoF with history of CAD and atrial fibrillation (on apixaban prior to admission) presented with SOB. Initially continued with apixaban. Now cardiac catheterization planned for 9/30, and pharmacy has been consulted to transition to IV heparin (last dose apixaban scheduled for 9/28 at approximately 2200).   CBC stable with Hgb 9.2 and platelets 461. No bleeding documented. Will order baseline HL and aPTT with 9/29 daily AM labs and time heparin infusion to begin when the next apixaban dose would be due (approximately 1000 on 9/29). As apixaban alters results of heparin levels, will need to titrate heparin infusion using aPTT until HL and aPTT levels correlate.  Goal of Therapy:  Heparin level 0.3-0.7 units/ml aPTT 66-102 seconds Monitor platelets by anticoagulation protocol: Yes   Plan:  Start heparin infusion @1200  units/hr on 9/29 morning D/c apixaban after 9/28 evening dose Check aPTT 8 hours after heparin infusion started (baseline HL and aPTT ordered with daily AM labs) Daily HL, CBC while  on heparin Monitor for s/sx bleeding Cardiac cath planned 9/30   Brendolyn Patty, PharmD PGY1 Pharmacy Resident Phone [phone]  05/27/2018   3:55 PM

## 2018-05-27 NOTE — Progress Notes (Signed)
PROGRESS NOTE    Mary [name]  YSA:[id number] DOB: 08-06-1941 DOA: 05/23/2018 PCP: Hoyt Koch, MD  Brief Narrative:Mary [name] is a 77 y.o. female with medical history significant of A. fib, CHF, type 2 diabetes, presents with shortness of breath.  Patient was admitted a few days ago to [facility] for CHF exacerbation.  She was upset because she was unable to see cardiology there and left AMA yesterday.  Today she continued to have orthopnea, slight cough and shortness of breath.  Patient had a chest x-ray did show some vascular congestion .she was given 80 mg of IV Lasix in ED.  She had urine output x1.  Patient denies any known history of COPD but has a long history of tobacco use.  sHe initially became ill with cold-like symptoms 2 weeks ago.  She got better for short time but cough and orthopnea resumed .  Her lower extremities have been increasing in its swelling over the past week.  States she is compliant with her medications and follows with a local cardiologist here.  Patient denies any chest pain.     ED Course: Patient had a chest x-ray which showed show some vascular  Congestion.she was given 80 mg of IV Lasix in ED.    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 Chest x ray cardiomegaly, vascular congestion.  Takes 80 mg oral lasix at home.  Continue with 40 mg IV lasix BID. Plan to hold lasix tomorrow.  New oxygen requirement. tapering oxygen off.  Weight; 273---269--270--269---268 Cardiology consulted.  ECHO with wall motion abnormalities.  Cath on  Monday  2-Acute Bronchitis; COPD exacerbation  Used to smoke.  Schedule nebulizer. Change back to Q 6 hours.  Needs PFT outpatient.  Continue with prednisone for 5 days.   Doxy 5 days.  Improving.   3-Acute hypoxic respiratory failure;  In setting on HF exacerbation and COPD.  IV lasix. Nebulizer.  New oxygen requirement. Was on 3 L. Now on Room air.    4-A fib; chronic;   eliquis. Flecainide discontinue due to new low EF.   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   iron level. Iron low. IV iron today.  Check B 12 level.   6-CKD stage III Baseline 1.1-1.2  Monitor on lasix.  Cr at 1.3 monitor.   DM; hold lantus due to multiples episode of hypoglycemia and poor oral intake   DVT prophylaxis: Eliquis Code Status: full code.  Family Communication: care discussed with patient.  Disposition Plan: Remain in patient for IV lasix. New oxygen requirement.   Consultants:   Cardiology    Procedures:   none   Antimicrobials: doxy    Subjective: Breathing better . Wheezing improved.   Objective: Vitals:   05/26/18 1523 05/26/18 2004 05/27/18 0100 05/27/18 0447  BP: 111/66 123/82 119/66 (!) 108/51  Pulse: 65 73 70 74  Resp: 18 18 18 18   Temp: 98.5 F (36.9 C) 98.4 F (36.9 C) 98.3 F (36.8 C) 98.2 F (36.8 C)  TempSrc: Oral Oral  Oral Oral  SpO2: 100% 95% 94% 94%  Weight:    121.7 kg  Height:        Intake/Output Summary (Last 24 hours) at 05/27/2018 1041 Last data filed at 05/27/2018 0951 Gross per 24 hour  Intake 317.28 ml  Output 2400 ml  Net -2082.72 ml   Filed Weights   05/25/18 0401 05/26/18 0536 05/27/18 0447  Weight: 122.7 kg 122.2 kg 121.7 kg    Examination:  General exam: NAD Respiratory system; bilateral crackles, no wheezing.  Cardiovascular system: S 1, S 2  Gastrointestinal system: BS present, soft, nt Central nervous system; Non focal.  Extremities: trace edema Skin: no rashes.     Data  Reviewed: I have personally reviewed following labs and imaging studies  CBC: Recent Labs  Lab 05/21/18 1813 05/23/18 1840 05/24/18 0815 05/26/18 0701  WBC 9.6 12.8* 9.8 11.1*  NEUTROABS 6.6  --   --   --   HGB 9.8* 9.2* 9.3* 9.2*  HCT 31.8* 30.6* 30.7* 31.2*  MCV 80.3 82.5 81.6 82.8  PLT 445* 420* 433* 409*   Basic Metabolic Panel: Recent Labs  Lab 05/23/18 1840 05/24/18 0815 05/25/18 0531 05/26/18 0701 05/27/18 0744  NA 130* 134* 138 139 135  K 4.3 3.5 4.0 4.0 4.0  CL 87* 90* 94* 91* 89*  CO2 30 32 35* 36* 33*  GLUCOSE 176* 104* 70 100* 138*  BUN 27* 24* 26* 28* 29*  CREATININE 1.26* 1.16* 1.18* 1.14* 1.35*  CALCIUM 8.8* 8.9 9.0 9.4 9.4   GFR: Estimated Creatinine Clearance: 47.9 mL/min (A) (by C-G formula based on SCr of 1.35 mg/dL (H)). Liver Function Tests: Recent Labs  Lab 05/25/18 0531  AST 26  ALT 19  ALKPHOS 103  BILITOT 0.5  PROT 6.7  ALBUMIN 3.0*   No results for input(s): LIPASE, AMYLASE in the last 168 hours. No results for input(s): AMMONIA in the last 168 hours. Coagulation Profile: No results for input(s): INR, PROTIME in the last 168 hours. Cardiac Enzymes: No results for input(s): CKTOTAL, CKMB, CKMBINDEX, TROPONINI in the last 168 hours. BNP (last 3 results) No results for input(s): PROBNP in the last 8760 hours. HbA1C: No results for input(s): HGBA1C in the last 72 hours. CBG: Recent Labs  Lab 05/26/18 0731 05/26/18 1152 05/26/18 1655 05/26/18 2056 05/27/18 0732  GLUCAP 98 242* 194* 175* 141*   Lipid Profile: Recent Labs    05/25/18 0531  CHOL 124  HDL 35*  LDLCALC 73  TRIG 81  CHOLHDL 3.5   Thyroid Function Tests: No results for input(s): TSH, T4TOTAL, FREET4, T3FREE, THYROIDAB in the last 72 hours. Anemia Panel: Recent Labs    05/26/18 0701  FERRITIN 11  TIBC 493*  IRON 17*   Sepsis Labs: Recent Labs  Lab 05/22/18 1512  PROCALCITON <0.10    No results found for this or any previous visit (from the past  240 hour(s)).       Radiology Studies: No results found.      Scheduled Meds: . apixaban  5 mg Oral BID  . arformoterol  15 mcg Nebulization BID  . atorvastatin  80 mg Oral q1800  . buPROPion  150 mg Oral Daily  . doxycycline  100 mg Oral Q12H  . furosemide  40 mg Intravenous Q12H  . gabapentin  300 mg Oral QHS  . insulin aspart  0-20 Units Subcutaneous TID WC  . insulin aspart  0-5 Units Subcutaneous QHS  . ipratropium-albuterol  3 mL Nebulization TID  .  latanoprost  1 drop Right Eye QHS  . levothyroxine  125 mcg Oral QAC breakfast  . metoprolol succinate  12.5 mg Oral QHS  . potassium chloride SA  40 mEq Oral Daily  . predniSONE  20 mg Oral Q breakfast  . sodium chloride flush  3 mL Intravenous Q12H  . sodium chloride flush  3 mL Intravenous Q12H   Continuous Infusions: . sodium chloride    . sodium chloride    . sodium chloride Stopped (05/26/18 1730)  . ferumoxytol       LOS: 3 days    Time spent: 35 minutes.     Elmarie Shiley, MD [facility] Pager [phone]  If 7PM-7AM, please contact night-coverage [email or web address] Password [facility] 05/27/2018, 10:41 AM

## 2018-05-28 LAB — GLUCOSE, CAPILLARY
GLUCOSE-CAPILLARY: 143 mg/dL — AB (ref 70–99)
GLUCOSE-CAPILLARY: 159 mg/dL — AB (ref 70–99)
Glucose-Capillary: 244 mg/dL — ABNORMAL HIGH (ref 70–99)
Glucose-Capillary: 280 mg/dL — ABNORMAL HIGH (ref 70–99)
Glucose-Capillary: 170 mg/dL — ABNORMAL HIGH (ref 70–99)

## 2018-05-28 LAB — BASIC METABOLIC PANEL
ANION GAP: 11 (ref 5–15)
BUN: 31 mg/dL — ABNORMAL HIGH (ref 8–23)
CALCIUM: 9.1 mg/dL (ref 8.9–10.3)
CO2: 35 mmol/L — AB (ref 22–32)
Chloride: 89 mmol/L — ABNORMAL LOW (ref 98–111)
Creatinine, Ser: 1.27 mg/dL — ABNORMAL HIGH (ref 0.44–1.00)
GFR calc Af Amer: 46 mL/min — ABNORMAL LOW (ref >60)
GFR calc non Af Amer: 40 mL/min — ABNORMAL LOW (ref >60)
GLUCOSE: 152 mg/dL — AB (ref 70–99)
Potassium: 3.8 mmol/L (ref 3.5–5.1)
Sodium: 135 mmol/L (ref 135–145)

## 2018-05-28 LAB — APTT
APTT: 31 s (ref 24–36)
aPTT: 65 seconds — ABNORMAL HIGH (ref 24–36)

## 2018-05-28 LAB — CBC
HEMATOCRIT: 30.7 % — AB (ref 36.0–46.0)
Hemoglobin: 9.3 g/dL — ABNORMAL LOW (ref 12.0–15.0)
MCH: 24.9 pg — ABNORMAL LOW (ref 26.0–34.0)
MCHC: 30.3 g/dL (ref 30.0–36.0)
MCV: 82.1 fL (ref 78.0–100.0)
Platelets: 438 10*3/uL — ABNORMAL HIGH (ref 150–400)
RBC: 3.74 MIL/uL — ABNORMAL LOW (ref 3.87–5.11)
RDW: 15.8 % — AB (ref 11.5–15.5)
WBC: 10.9 10*3/uL — AB (ref 4.0–10.5)

## 2018-05-28 LAB — VITAMIN B12: Vitamin B-12: 268 pg/mL (ref 180–914)

## 2018-05-28 LAB — HEPARIN LEVEL (UNFRACTIONATED): Heparin Unfractionated: >2.2 IU/mL — ABNORMAL HIGH (ref 0.30–0.70)

## 2018-05-28 MED ORDER — SODIUM CHLORIDE 0.9 % IV SOLN: 250.0000 mL | INTRAVENOUS | Status: DC | PRN

## 2018-05-28 MED ORDER — SODIUM CHLORIDE 0.9% FLUSH: 3.0000 mL | Freq: Two times a day (BID) | INTRAVENOUS | Status: DC

## 2018-05-28 MED ORDER — SODIUM CHLORIDE 0.9% FLUSH: 3.0000 mL | INTRAVENOUS | Status: DC | PRN

## 2018-05-28 MED ORDER — POTASSIUM CHLORIDE CRYS ER 20 MEQ PO TBCR
40.0000 meq | EXTENDED_RELEASE_TABLET | Freq: Every day | ORAL | Status: DC
  Administered 2018-05-28 – 2018-05-29 (×2): 40 meq via ORAL
  Filled 2018-05-28 (×2): qty 2

## 2018-05-28 MED ORDER — CYANOCOBALAMIN 1000 MCG/ML IJ SOLN
100.0000 ug | Freq: Once | INTRAMUSCULAR | Status: AC
  Administered 2018-05-28: 100 ug via INTRAMUSCULAR
  Filled 2018-05-28: qty 0.1

## 2018-05-28 MED ORDER — ASPIRIN EC 81 MG PO TBEC
81.0000 mg | DELAYED_RELEASE_TABLET | Freq: Every day | ORAL | Status: DC
  Administered 2018-05-28 – 2018-05-30 (×3): 81 mg via ORAL
  Filled 2018-05-28 (×3): qty 1

## 2018-05-28 MED ORDER — SODIUM CHLORIDE 0.9 % IV SOLN
INTRAVENOUS | Status: DC
  Administered 2018-05-29: 04:00:00 via INTRAVENOUS

## 2018-05-28 MED ORDER — POLYETHYLENE GLYCOL 3350 17 G PO PACK
17.0000 g | PACK | Freq: Two times a day (BID) | ORAL | Status: DC
  Administered 2018-05-28 (×2): 17 g via ORAL
  Filled 2018-05-28 (×5): qty 1

## 2018-05-28 NOTE — Progress Notes (Addendum)
Progress Note  Patient Name: Mary [name] Date of Encounter: 05/28/2018  Primary Cardiologist: Sinclair Grooms, MD   Subjective   No dyspnea or CP  Inpatient Medications    Scheduled Meds: . atorvastatin  80 mg Oral q1800  . buPROPion  150 mg Oral Daily  . gabapentin  300 mg Oral QHS  . insulin aspart  0-20 Units Subcutaneous TID WC  . insulin aspart  0-5 Units Subcutaneous QHS  . latanoprost  1 drop Right Eye QHS  . levothyroxine  125 mcg Oral QAC breakfast  . metoprolol succinate  12.5 mg Oral QHS  . polyethylene glycol  17 g Oral BID  . potassium chloride SA  40 mEq Oral Daily  . sodium chloride flush  3 mL Intravenous Q12H  . sodium chloride flush  3 mL Intravenous Q12H   Continuous Infusions: . sodium chloride    . sodium chloride    . sodium chloride Stopped (05/26/18 1730)  . heparin     PRN Meds: sodium chloride, sodium chloride, acetaminophen, albuterol, HYDROcodone-acetaminophen, magnesium hydroxide, nitroGLYCERIN, ondansetron (ZOFRAN) IV, sodium chloride flush, sodium chloride flush   Vital Signs    Vitals:   05/28/18 0601 05/28/18 0855 05/28/18 0901 05/28/18 1005  BP: 132/60   (!) 92/47  Pulse: 71 82  67  Resp: (!) 22 18  20   Temp: 98.1 F (36.7 C)   98 F (36.7 C)  TempSrc: Oral   Oral  SpO2: 94% 95% 95% 98%  Weight:      Height:        Intake/Output Summary (Last 24 hours) at 05/28/2018 1037 Last data filed at 05/28/2018 0730 Gross per 24 hour  Intake 1312.01 ml  Output 1350 ml  Net -37.99 ml   Filed Weights   05/26/18 0536 05/27/18 0447 05/28/18 0426  Weight: 122.2 kg 121.7 kg 120.1 kg    Telemetry    AV paced - Personally Reviewed  Physical Exam   GEN: WD NAD Neck: supple Cardiac: RRR Respiratory: mild exp wheeze GI: Soft, NT/ND MS: 1+ edema Neuro:  Grossly intact   Labs    Chemistry Recent Labs  Lab 05/25/18 0531 05/26/18 0701 05/27/18 0744 05/28/18 0500  NA 138 139 135 135  K 4.0 4.0 4.0 3.8  CL 94* 91*  89* 89*  CO2 35* 36* 33* 35*  GLUCOSE 70 100* 138* 152*  BUN 26* 28* 29* 31*  CREATININE 1.18* 1.14* 1.35* 1.27*  CALCIUM 9.0 9.4 9.4 9.1  PROT 6.7  --   --   --   ALBUMIN 3.0*  --   --   --   AST 26  --   --   --   ALT 19  --   --   --   ALKPHOS 103  --   --   --   BILITOT 0.5  --   --   --   GFRNONAA 44* 46* 37* 40*  GFRAA 51* 53* 43* 46*  ANIONGAP 9 12 13 11      Hematology Recent Labs  Lab 05/24/18 0815 05/26/18 0701 05/28/18 0500  WBC 9.8 11.1* 10.9*  RBC 3.76* 3.77* 3.74*  HGB 9.3* 9.2* 9.3*  HCT 30.7* 31.2* 30.7*  MCV 81.6 82.8 82.1  MCH 24.7* 24.4* 24.9*  MCHC 30.3 29.5* 30.3  RDW 15.8* 15.9* 15.8*  PLT 433* 461* 438*    Recent Labs  Lab 05/21/18 1820 05/21/18 2153 05/23/18 1902  TROPIPOC 0.00 0.00 0.00  BNP Recent Labs  Lab 05/21/18 1813 05/23/18 1840  BNP 257.3* 196.3*     DDimer  Recent Labs  Lab 05/21/18 1813  DDIMER 0.41       Patient Profile     77 y.o. 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  Echocardiogram September 2019 showed ejection fraction 40 to 45%, mild aortic stenosis, mild left atrial enlargement, mild to moderate tricuspid regurgitation and severe pulmonary hypertension.  Follow-up echocardiogram with [facility] showed ejection fraction 55 to 60% with hypokinesis of the apical septum.  Assessment & Plan    1 acute/chronic combined systolic/diastolic congestive heart failure-patient much improved compared to admission.  She is scheduled for cardiac catheterization tomorrow.  We will hold diuretics today and tomorrow in  anticipation of catheterization.  Resume following procedure when renal function stable.    2 coronary artery disease-follow-up echocardiogram shows preserved LV function but there are new wall motion abnormalities.  Plan is to proceed with cardiac catheterization tomorrow morning.  Continue statin and aspirin 81 mg daily (pt does not have true allergy).  If no intervention required will discontinue aspirin given need for apixaban.  3 history of paroxysmal atrial fibrillation and atrial tachycardia-patient remains AV paced today.  Flecainide was discontinued because of underlying coronary artery disease.  Continue beta-blocker.  Will consider different antiarrhythmic if arrhythmias recur.  Apixaban on hold prior to catheterization.  Will resume following procedure.    4 iron deficiency-noted on iron studies.  Management per primary care.  5 prior pacemaker  6 chronic stage III kidney disease-hold diuretics today and tomorrow prior to procedure.  Recheck renal function in a.m.  No ventriculogram.  Limit dye.  Follow renal function after procedure.    7 COPD- per primary care  For questions or updates, please contact [place] Please consult [email or web address] for contact info under        Signed, Kirk Ruths, MD  05/28/2018, 10:37 AM

## 2018-05-28 NOTE — H&P (View-Only) (Signed)
Progress Note  Patient Name: [name] Date of Encounter: 05/28/2018  Primary Cardiologist: Sinclair Grooms, MD   Subjective   No dyspnea or CP  Inpatient Medications    Scheduled Meds: . atorvastatin  80 mg Oral q1800  . buPROPion  150 mg Oral Daily  . gabapentin  300 mg Oral QHS  . insulin aspart  0-20 Units Subcutaneous TID WC  . insulin aspart  0-5 Units Subcutaneous QHS  . latanoprost  1 drop Right Eye QHS  . levothyroxine  125 mcg Oral QAC breakfast  . metoprolol succinate  12.5 mg Oral QHS  . polyethylene glycol  17 g Oral BID  . potassium chloride SA  40 mEq Oral Daily  . sodium chloride flush  3 mL Intravenous Q12H  . sodium chloride flush  3 mL Intravenous Q12H   Continuous Infusions: . sodium chloride    . sodium chloride    . sodium chloride Stopped (05/26/18 1730)  . heparin     PRN Meds: sodium chloride, sodium chloride, acetaminophen, albuterol, HYDROcodone-acetaminophen, magnesium hydroxide, nitroGLYCERIN, ondansetron (ZOFRAN) IV, sodium chloride flush, sodium chloride flush   Vital Signs    Vitals:   05/28/18 0601 05/28/18 0855 05/28/18 0901 05/28/18 1005  BP: 132/60   (!) 92/47  Pulse: 71 82  67  Resp: (!) 22 18  20   Temp: 98.1 F (36.7 C)   98 F (36.7 C)  TempSrc: Oral   Oral  SpO2: 94% 95% 95% 98%  Weight:      Height:        Intake/Output Summary (Last 24 hours) at 05/28/2018 1037 Last data filed at 05/28/2018 0730 Gross per 24 hour  Intake 1312.01 ml  Output 1350 ml  Net -37.99 ml   Filed Weights   05/26/18 0536 05/27/18 0447 05/28/18 0426  Weight: 122.2 kg 121.7 kg 120.1 kg    Telemetry    AV paced - Personally Reviewed  Physical Exam   GEN: WD NAD Neck: supple Cardiac: RRR Respiratory: mild exp wheeze GI: Soft, NT/ND MS: 1+ edema Neuro:  Grossly intact   Labs    Chemistry Recent Labs  Lab 05/25/18 0531 05/26/18 0701 05/27/18 0744 05/28/18 0500  NA 138 139 135 135  K 4.0 4.0 4.0 3.8  CL 94* 91*  89* 89*  CO2 35* 36* 33* 35*  GLUCOSE 70 100* 138* 152*  BUN 26* 28* 29* 31*  CREATININE 1.18* 1.14* 1.35* 1.27*  CALCIUM 9.0 9.4 9.4 9.1  PROT 6.7  --   --   --   ALBUMIN 3.0*  --   --   --   AST 26  --   --   --   ALT 19  --   --   --   ALKPHOS 103  --   --   --   BILITOT 0.5  --   --   --   GFRNONAA 44* 46* 37* 40*  GFRAA 51* 53* 43* 46*  ANIONGAP 9 12 13 11      Hematology Recent Labs  Lab 05/24/18 0815 05/26/18 0701 05/28/18 0500  WBC 9.8 11.1* 10.9*  RBC 3.76* 3.77* 3.74*  HGB 9.3* 9.2* 9.3*  HCT 30.7* 31.2* 30.7*  MCV 81.6 82.8 82.1  MCH 24.7* 24.4* 24.9*  MCHC 30.3 29.5* 30.3  RDW 15.8* 15.9* 15.8*  PLT 433* 461* 438*    Recent Labs  Lab 05/21/18 1820 05/21/18 2153 05/23/18 1902  TROPIPOC 0.00 0.00 0.00  BNP Recent Labs  Lab 05/21/18 1813 05/23/18 1840  BNP 257.3* 196.3*     DDimer  Recent Labs  Lab 05/21/18 1813  DDIMER 0.41       Patient Profile     77 y.o. 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  Echocardiogram September 2019 showed ejection fraction 40 to 45%, mild aortic stenosis, mild left atrial enlargement, mild to moderate tricuspid regurgitation and severe pulmonary hypertension.  Follow-up echocardiogram with [facility] showed ejection fraction 55 to 60% with hypokinesis of the apical septum.  Assessment & Plan    1 acute/chronic combined systolic/diastolic congestive heart failure-patient much improved compared to admission.  She is scheduled for cardiac catheterization tomorrow.  We will hold diuretics today and tomorrow in  anticipation of catheterization.  Resume following procedure when renal function stable.    2 coronary artery disease-follow-up echocardiogram shows preserved LV function but there are new wall motion abnormalities.  Plan is to proceed with cardiac catheterization tomorrow morning.  Continue statin and aspirin 81 mg daily (pt does not have true allergy).  If no intervention required will discontinue aspirin given need for apixaban.  3 history of paroxysmal atrial fibrillation and atrial tachycardia-patient remains AV paced today.  Flecainide was discontinued because of underlying coronary artery disease.  Continue beta-blocker.  Will consider different antiarrhythmic if arrhythmias recur.  Apixaban on hold prior to catheterization.  Will resume following procedure.    4 iron deficiency-noted on iron studies.  Management per primary care.  5 prior pacemaker  6 chronic stage III kidney disease-hold diuretics today and tomorrow prior to procedure.  Recheck renal function in a.m.  No ventriculogram.  Limit dye.  Follow renal function after procedure.    7 COPD- per primary care  For questions or updates, please contact [place] Please consult [email or web address] for contact info under        Signed, Kirk Ruths, MD  05/28/2018, 10:37 AM

## 2018-05-28 NOTE — Progress Notes (Signed)
[place] for heparin Indication: chest pain/ACS  Allergies  Allergen Reactions  . Other Other (See Comments)    NO Blind Scopes with Naso Gastric tube.  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 Adhesive [Tape]     Tape - burns - pls use paper tape or elastic bandage  . Aspirin Other (See Comments)    S/P gastric bypass surgery, states her MD told her to not take Aspirin.  . Latex     Tape= burns  . Nsaids Other (See Comments)    S/P gastric bypass-told not to take. GI bleeds with NSAIDS    Patient Measurements: Height: 5\' 7"  (170.2 cm) Weight: 264 lb 12.8 oz (120.1 kg) IBW/kg (Calculated) : 61.6 HEPARIN DW (KG): 91   Vital Signs: Temp: 98.3 F (36.8 C) (09/29 2037) Temp Source: Oral (09/29 2037) BP: 111/64 (09/29 2037) Pulse Rate: 72 (09/29 2037)  Labs: Recent Labs    05/26/18 0701 05/27/18 0744 05/28/18 0500 05/28/18 2031  HGB 9.2*  --  9.3*  --   HCT 31.2*  --  30.7*  --   PLT 461*  --  438*  --   APTT  --   --  31 65*  HEPARINUNFRC  --   --  >2.20*  --   CREATININE 1.14* 1.35* 1.27*  --     Estimated Creatinine Clearance: 50.6 mL/min (A) (by C-G formula based on SCr of 1.27 mg/dL (H)).   Medical History: Past Medical History:  Diagnosis Date  . Arthritis   . Atrial tachycardia ([place]) 04/23/2017  . CAD (coronary artery disease)    non obstructive  . 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 Procedure complicated by an atrial lead dislodgment and repeat procedure, 12/2013   . Carotid stenosis    Carotid US 5/16:  Bilateral ICA 1-39%; L vertebral retrograde; L BP 126/49, R BP 140/57  . Chronic combined systolic and diastolic CHF (congestive heart failure) ([place])   . Chronic lower back pain   . Colon cancer ([place])    a. s/p chemo  . Colon polyps   . Diabetic peripheral neuropathy ([place])   . DVT (deep venous thrombosis) ([place]) 2013   "twice behind knee on left side"  (06/25/2013)  . Glaucoma   . Hyperlipidemia   . Hypertension   . Hypothyroidism   . Iron deficiency anemia   . Multinodular goiter   . Osteoporosis   . PAF (paroxysmal atrial fibrillation) ([place])   . Sleep apnea    a. resolved post weight loss   . Type II diabetes mellitus (HCC)      Assessment: 73 yoF with history of CAD and atrial fibrillation (on apixaban prior to admission) presented with SOB. Initially continued with apixaban. Now cardiac catheterization planned for 9/30, and pharmacy has been consulted to transition to IV heparin (last dose of apixaban received 9/28 at approximately 2200).  -aPTT= 65 (slightly below goal)    Goal of Therapy:  Heparin level 0.3-0.7 units/ml aPTT 66-102 seconds Monitor platelets by anticoagulation protocol: Yes   Plan:  -Increase heparin to 1300 units/hr -Heparin level and CBC daily  Hildred Laser, PharmD Clinical Pharmacist Please check Amion for pharmacy contact number

## 2018-05-28 NOTE — Progress Notes (Signed)
Patient resting comfortably during shift report. Denies complaints.  

## 2018-05-28 NOTE — Progress Notes (Signed)
PROGRESS NOTE    Mary [name]  OEU:[id number] DOB: 27-Feb-1941 DOA: 05/23/2018 PCP: Hoyt Koch, MD  Brief Narrative:Mary [name] is a 77 y.o. female with medical history significant of A. fib, CHF, type 2 diabetes, presents with shortness of breath.  Patient was admitted a few days ago to [facility] for CHF exacerbation.  She was upset because she was unable to see cardiology there and left AMA yesterday.  Today she continued to have orthopnea, slight cough and shortness of breath.  Patient had a chest x-ray did show some vascular congestion .she was given 80 mg of IV Lasix in ED.  She had urine output x1.  Patient denies any known history of COPD but has a long history of tobacco use.  sHe initially became ill with cold-like symptoms 2 weeks ago.  She got better for short time but cough and orthopnea resumed .  Her lower extremities have been increasing in its swelling over the past week.  States she is compliant with her medications and follows with a local cardiologist here.  Patient denies any chest pain.     ED Course: Patient had a chest x-ray which showed show some vascular  Congestion.she was given 80 mg of IV Lasix in ED.    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 Chest x ray cardiomegaly, vascular congestion.  Takes 80 mg oral lasix at home.  Continue with 40 mg IV lasix BID. Hold lasix today in anticipation for cath tomorrow.  New oxygen requirement. tapering oxygen off.  Weight; 273---269--270--269---268 Cardiology consulted.  ECHO with wall motion abnormalities.    Cath on Monday  2-Acute Bronchitis; COPD exacerbation  Used to smoke.  Schedule nebulizer. Change back to Q 6 hours.  Needs PFT outpatient.  Finished prednisone for 5 days.   Doxy 5 days.  Improving.   3-Acute hypoxic respiratory failure;  In setting on HF exacerbation and COPD.  IV lasix. Nebulizer.  New oxygen requirement. Was on 3 L. Now on Room air.  Improved.   4-A fib; chronic;   eliquis. Flecainide discontinue due to new low EF.   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   iron level. Iron low. IV iron today.  Check B 12 level. Will give injection. B 12 low normal.  Monitor for blood loss on heparin.   6-CKD stage III Baseline 1.1-1.2  Monitor on lasix.  Cr at 1.3 monitor.   DM; hold lantus due to multiples episode of hypoglycemia and poor oral intake  SSI.   Constipation;  Start Miralax.   DVT prophylaxis: heparin gtt prior to cath  Code Status: full code.  Family Communication: care discussed with patient.  Disposition Plan: Remain in patient for IV lasix. New oxygen requirement.   Consultants:   Cardiology    Procedures:   none   Antimicrobials: doxy    Subjective: Dyspnea improved. Mild cough/ Had small amount of blood tissue paper.   Objective: Vitals:   05/28/18 0426 05/28/18 0601 05/28/18 0855 05/28/18 0901  BP:  132/60  Pulse:  71 82   Resp:  (!) 22 18   Temp:  98.1 F (36.7 C)    TempSrc:  Oral    SpO2:  94% 95% 95%  Weight: 120.1 kg     Height:        Intake/Output Summary (Last 24 hours) at 05/28/2018 1004 Last data filed at 05/28/2018 0730 Gross per 24 hour  Intake 1312.01 ml  Output 1350 ml  Net -37.99 ml   Filed Weights   05/26/18 0536 05/27/18 0447 05/28/18 0426  Weight: 122.2 kg 121.7 kg 120.1 kg    Examination:  General exam: NAD Respiratory system; CTA Cardiovascular system: S 1, S 2  Gastrointestinal system: BS present, soft, nt Central nervous system; Non focal.   Extremities: trace edema, feet  Skin: No rashes.     Data Reviewed: I have personally reviewed following labs and imaging studies  CBC: Recent Labs  Lab 05/21/18 1813 05/23/18 1840 05/24/18 0815 05/26/18 0701 05/28/18 0500  WBC 9.6 12.8* 9.8 11.1* 10.9*  NEUTROABS 6.6  --   --   --   --   HGB 9.8* 9.2* 9.3* 9.2* 9.3*  HCT 31.8* 30.6* 30.7* 31.2* 30.7*  MCV 80.3 82.5 81.6 82.8 82.1  PLT 445* 420* 433* 461* 350*   Basic Metabolic Panel: Recent Labs  Lab 05/24/18 0815 05/25/18 0531 05/26/18 0701 05/27/18 0744 05/28/18 0500  NA 134* 138 139 135 135  K 3.5 4.0 4.0 4.0 3.8  CL 90* 94* 91* 89* 89*  CO2 32 35* 36* 33* 35*  GLUCOSE 104* 70 100* 138* 152*  BUN 24* 26* 28* 29* 31*  CREATININE 1.16* 1.18* 1.14* 1.35* 1.27*  CALCIUM 8.9 9.0 9.4 9.4 9.1   GFR: Estimated Creatinine Clearance: 50.6 mL/min (A) (by C-G formula based on SCr of 1.27 mg/dL (H)). Liver Function Tests: Recent Labs  Lab 05/25/18 0531  AST 26  ALT 19  ALKPHOS 103  BILITOT 0.5  PROT 6.7  ALBUMIN 3.0*   No results for input(s): LIPASE, AMYLASE in the last 168 hours. No results for input(s): AMMONIA in the last 168 hours. Coagulation Profile: No results for input(s): INR, PROTIME in the last 168 hours. Cardiac Enzymes: No results for input(s): CKTOTAL, CKMB, CKMBINDEX, TROPONINI in the last 168 hours. BNP (last 3 results) No results for input(s): PROBNP in the last 8760 hours. HbA1C: No results for input(s): HGBA1C in the last 72 hours. CBG: Recent Labs  Lab 05/27/18 1119 05/27/18 1627 05/27/18 2117 05/28/18 0420 05/28/18 0751  GLUCAP 141* 250* 210* 143* 159*   Lipid Profile: No results for input(s): CHOL, HDL, LDLCALC, TRIG, CHOLHDL, LDLDIRECT in the last 72 hours. Thyroid Function Tests: No results for input(s): TSH, T4TOTAL, FREET4, T3FREE, THYROIDAB in the last 72 hours. Anemia Panel: Recent Labs    05/26/18 0701 05/28/18 0500  VITAMINB12  --  268  FERRITIN 11  --   TIBC  493*  --   IRON 17*  --    Sepsis Labs: Recent Labs  Lab 05/22/18 1512  PROCALCITON <0.10    No results found for this or any previous visit (from the past 240 hour(s)).       Radiology Studies: No results found.      Scheduled Meds: . atorvastatin  80 mg Oral q1800  . buPROPion  150 mg Oral Daily  . gabapentin  300 mg Oral QHS  . insulin aspart  0-20 Units Subcutaneous TID WC  . insulin aspart  0-5 Units Subcutaneous QHS  .  latanoprost  1 drop Right Eye QHS  . levothyroxine  125 mcg Oral QAC breakfast  . metoprolol succinate  12.5 mg Oral QHS  . polyethylene glycol  17 g Oral BID  . potassium chloride SA  40 mEq Oral Daily  . sodium chloride flush  3 mL Intravenous Q12H  . sodium chloride flush  3 mL Intravenous Q12H   Continuous Infusions: . sodium chloride    . sodium chloride    . sodium chloride Stopped (05/26/18 1730)  . heparin       LOS: 4 days    Time spent: 35 minutes.     Elmarie Shiley, MD [facility] Pager [phone]  If 7PM-7AM, please contact night-coverage [email or web address] Password [email or web address] 05/28/2018, 10:04 AM

## 2018-05-28 NOTE — Progress Notes (Signed)
ANTICOAGULATION CONSULT NOTE - Initial Consult  Pharmacy Consult for heparin Indication: chest pain/ACS  Allergies  Allergen Reactions  . Other Other (See Comments)    NO Blind Scopes with Naso Gastric tube.  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 Adhesive [Tape]     Tape - burns - pls use paper tape or elastic bandage  . Aspirin Other (See Comments)    S/P gastric bypass surgery, states her MD told her to not take Aspirin.  . Latex     Tape= burns  . Nsaids Other (See Comments)    S/P gastric bypass-told not to take. GI bleeds with NSAIDS    Patient Measurements: Height: 5\' 7"  (170.2 cm) Weight: 264 lb 12.8 oz (120.1 kg) IBW/kg (Calculated) : 61.6 HEPARIN DW (KG): 91   Vital Signs: Temp: 98 F (36.7 C) (09/29 1005) Temp Source: Oral (09/29 1005) BP: 92/47 (09/29 1005) Pulse Rate: 67 (09/29 1005)  Labs: Recent Labs    05/26/18 0701 05/27/18 0744 05/28/18 0500  HGB 9.2*  --  9.3*  HCT 31.2*  --  30.7*  PLT 461*  --  438*  APTT  --   --  31  HEPARINUNFRC  --   --  >2.20*  CREATININE 1.14* 1.35* 1.27*    Estimated Creatinine Clearance: 50.6 mL/min (A) (by C-G formula based on SCr of 1.27 mg/dL (H)).   Medical History: Past Medical History:  Diagnosis Date  . Arthritis   . Atrial tachycardia ([place]) 04/23/2017  . CAD (coronary artery disease)    non obstructive  . 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 Procedure complicated by an atrial lead dislodgment and repeat procedure, 12/2013   . Carotid stenosis    Carotid US 5/16:  Bilateral ICA 1-39%; L vertebral retrograde; L BP 126/49, R BP 140/57  . Chronic combined systolic and diastolic CHF (congestive heart failure) ([place])   . Chronic lower back pain   . Colon cancer ([place])    a. s/p chemo  . Colon polyps   . Diabetic peripheral neuropathy ([place])   . DVT (deep venous thrombosis) ([place]) 2013   "twice behind knee on left side" (06/25/2013)  . Glaucoma   .  Hyperlipidemia   . Hypertension   . Hypothyroidism   . Iron deficiency anemia   . Multinodular goiter   . Osteoporosis   . PAF (paroxysmal atrial fibrillation) ([place])   . Sleep apnea    a. resolved post weight loss   . Type II diabetes mellitus (HCC)      Assessment: 32 yoF with history of CAD and atrial fibrillation (on apixaban prior to admission) presented with SOB. Initially continued with apixaban. Now cardiac catheterization planned for 9/30, and pharmacy has been consulted to transition to IV heparin (last dose of apixaban received 9/28 at approximately 2200).   CBC stable with Hgb 9.3 and platelets 438. No bleeding documented. Baseline HL >2.20 and APTT 31. As apixaban alters results of heparin levels, will need to titrate heparin infusion using aPTT until HL and aPTT levels correlate.  Goal of Therapy:  Heparin level 0.3-0.7 units/ml aPTT 66-102 seconds Monitor platelets by anticoagulation protocol: Yes   Plan:  Start heparin infusion @1200  units/hr 9/29 AM Check aPTT 8 hours after heparin infusion started Daily HL, APTT, CBC while on heparin Monitor for s/sx bleeding Cardiac cath planned 9/30  Thank you for allowing pharmacy to be a part of this patient's care.  Wells Guiles  Marc Morgans, PharmD PGY1 Pharmacy Resident Phone: [phone]  Please check AMION for all [place] phone numbers  05/28/2018   11:26 AM

## 2018-05-28 NOTE — Plan of Care (Signed)
  Problem: Education: Goal: Knowledge of General Education information will improve Description: Including pain rating scale, medication(s)/side effects and non-pharmacologic comfort measures Outcome: Progressing   Problem: Health Behavior/Discharge Planning: Goal: Ability to manage health-related needs will improve Outcome: Progressing   Problem: Clinical Measurements: Goal: Will remain free from infection Outcome: Progressing   Problem: Clinical Measurements: Goal: Respiratory complications will improve Outcome: Progressing   Problem: Clinical Measurements: Goal: Cardiovascular complication will be avoided Outcome: Progressing   

## 2018-05-29 ENCOUNTER — Encounter (HOSPITAL_COMMUNITY)
Admission: EM | Disposition: A | Discharge status: Home / Self Care | Payer: Self-pay | Source: Home / Self Care | Attending: Internal Medicine

## 2018-05-29 ENCOUNTER — Encounter (HOSPITAL_COMMUNITY): Payer: Self-pay | Admitting: Interventional Cardiology

## 2018-05-29 DIAGNOSIS — N183 Chronic kidney disease, stage 3 (moderate): Secondary | ICD-10-CM

## 2018-05-29 DIAGNOSIS — I2510 Atherosclerotic heart disease of native coronary artery without angina pectoris: Secondary | ICD-10-CM

## 2018-05-29 HISTORY — PX: LEFT HEART CATH AND CORONARY ANGIOGRAPHY: CATH118249

## 2018-05-29 LAB — GLUCOSE, CAPILLARY
GLUCOSE-CAPILLARY: 166 mg/dL — AB (ref 70–99)
GLUCOSE-CAPILLARY: 196 mg/dL — AB (ref 70–99)
Glucose-Capillary: 109 mg/dL — ABNORMAL HIGH (ref 70–99)
Glucose-Capillary: 106 mg/dL — ABNORMAL HIGH (ref 70–99)

## 2018-05-29 LAB — CBC
HCT: 30.8 % — ABNORMAL LOW (ref 36.0–46.0)
HEMOGLOBIN: 9.2 g/dL — AB (ref 12.0–15.0)
MCH: 24.8 pg — AB (ref 26.0–34.0)
MCHC: 29.9 g/dL — AB (ref 30.0–36.0)
MCV: 83 fL (ref 78.0–100.0)
PLATELETS: 458 10*3/uL — AB (ref 150–400)
RBC: 3.71 MIL/uL — ABNORMAL LOW (ref 3.87–5.11)
RDW: 15.9 % — AB (ref 11.5–15.5)
WBC: 12.7 10*3/uL — ABNORMAL HIGH (ref 4.0–10.5)

## 2018-05-29 LAB — HEPARIN LEVEL (UNFRACTIONATED): Heparin Unfractionated: 1.18 IU/mL — ABNORMAL HIGH (ref 0.30–0.70)

## 2018-05-29 LAB — BASIC METABOLIC PANEL
Anion gap: 8 (ref 5–15)
BUN: 23 mg/dL (ref 8–23)
CALCIUM: 9.4 mg/dL (ref 8.9–10.3)
CO2: 34 mmol/L — ABNORMAL HIGH (ref 22–32)
CREATININE: 1.14 mg/dL — AB (ref 0.44–1.00)
Chloride: 95 mmol/L — ABNORMAL LOW (ref 98–111)
GFR calc Af Amer: 53 mL/min — ABNORMAL LOW (ref >60)
GFR calc non Af Amer: 46 mL/min — ABNORMAL LOW (ref >60)
Glucose, Bld: 112 mg/dL — ABNORMAL HIGH (ref 70–99)
Potassium: 3.9 mmol/L (ref 3.5–5.1)
Sodium: 137 mmol/L (ref 135–145)

## 2018-05-29 LAB — VITAMIN D 25 HYDROXY (VIT D DEFICIENCY, FRACTURES): VIT D 25 HYDROXY: 26.5 ng/mL — AB (ref 30.0–100.0)

## 2018-05-29 LAB — APTT: APTT: 113 s — AB (ref 24–36)

## 2018-05-29 SURGERY — LEFT HEART CATH AND CORONARY ANGIOGRAPHY
Anesthesia: LOCAL

## 2018-05-29 MED ORDER — HEPARIN SODIUM (PORCINE) 1000 UNIT/ML IJ SOLN
INTRAMUSCULAR | Status: DC | PRN
  Administered 2018-05-29: 5000 [IU] via INTRAVENOUS

## 2018-05-29 MED ORDER — MIDAZOLAM HCL 2 MG/2ML IJ SOLN
INTRAMUSCULAR | Status: AC
  Filled 2018-05-29: qty 2

## 2018-05-29 MED ORDER — SODIUM CHLORIDE 0.9 % IV SOLN: INTRAVENOUS | Status: AC

## 2018-05-29 MED ORDER — LIDOCAINE HCL (PF) 1 % IJ SOLN
INTRAMUSCULAR | Status: AC
  Filled 2018-05-29: qty 30

## 2018-05-29 MED ORDER — HEPARIN (PORCINE) IN NACL 1000-0.9 UT/500ML-% IV SOLN
INTRAVENOUS | Status: AC
  Filled 2018-05-29: qty 1000

## 2018-05-29 MED ORDER — HEPARIN (PORCINE) IN NACL 1000-0.9 UT/500ML-% IV SOLN
INTRAVENOUS | Status: DC | PRN
  Administered 2018-05-29 (×2): 500 mL

## 2018-05-29 MED ORDER — OXYCODONE HCL 5 MG PO TABS: 5.0000 mg | ORAL_TABLET | ORAL | Status: DC | PRN

## 2018-05-29 MED ORDER — SODIUM CHLORIDE 0.9 % IV SOLN: 250.0000 mL | INTRAVENOUS | Status: DC | PRN

## 2018-05-29 MED ORDER — APIXABAN 5 MG PO TABS
5.0000 mg | ORAL_TABLET | Freq: Two times a day (BID) | ORAL | Status: DC
  Administered 2018-05-29 – 2018-05-30 (×2): 5 mg via ORAL
  Filled 2018-05-29 (×2): qty 1

## 2018-05-29 MED ORDER — LIDOCAINE HCL (PF) 1 % IJ SOLN
INTRAMUSCULAR | Status: DC | PRN
  Administered 2018-05-29: 2 mL

## 2018-05-29 MED ORDER — FENTANYL CITRATE (PF) 100 MCG/2ML IJ SOLN
INTRAMUSCULAR | Status: DC | PRN
  Administered 2018-05-29: 25 ug via INTRAVENOUS

## 2018-05-29 MED ORDER — ACETAMINOPHEN 325 MG PO TABS: 650.0000 mg | ORAL_TABLET | ORAL | Status: DC | PRN

## 2018-05-29 MED ORDER — VERAPAMIL HCL 2.5 MG/ML IV SOLN
INTRAVENOUS | Status: DC | PRN
  Administered 2018-05-29: 10 mL via INTRA_ARTERIAL

## 2018-05-29 MED ORDER — HEPARIN SODIUM (PORCINE) 5000 UNIT/ML IJ SOLN: 5000.0000 [IU] | Freq: Three times a day (TID) | INTRAMUSCULAR | Status: DC

## 2018-05-29 MED ORDER — ONDANSETRON HCL 4 MG/2ML IJ SOLN: 4.0000 mg | Freq: Four times a day (QID) | INTRAMUSCULAR | Status: DC | PRN

## 2018-05-29 MED ORDER — VERAPAMIL HCL 2.5 MG/ML IV SOLN
INTRAVENOUS | Status: AC
  Filled 2018-05-29: qty 2

## 2018-05-29 MED ORDER — SODIUM CHLORIDE 0.9% FLUSH
3.0000 mL | Freq: Two times a day (BID) | INTRAVENOUS | Status: DC
  Administered 2018-05-29 – 2018-05-30 (×2): 3 mL via INTRAVENOUS

## 2018-05-29 MED ORDER — IOHEXOL 350 MG/ML SOLN
INTRAVENOUS | Status: DC | PRN
  Administered 2018-05-29: 50 mL via INTRACARDIAC

## 2018-05-29 MED ORDER — FENTANYL CITRATE (PF) 100 MCG/2ML IJ SOLN
INTRAMUSCULAR | Status: AC
  Filled 2018-05-29: qty 2

## 2018-05-29 MED ORDER — SODIUM CHLORIDE 0.9% FLUSH: 3.0000 mL | INTRAVENOUS | Status: DC | PRN

## 2018-05-29 MED ORDER — MIDAZOLAM HCL 2 MG/2ML IJ SOLN
INTRAMUSCULAR | Status: DC | PRN
  Administered 2018-05-29: 1 mg via INTRAVENOUS

## 2018-05-29 SURGICAL SUPPLY — 9 items

## 2018-05-29 NOTE — Plan of Care (Signed)
  Problem: Education: ?Goal: Knowledge of General Education information will improve ?Description: Including pain rating scale, medication(s)/side effects and non-pharmacologic comfort measures ?Outcome: Progressing ?  ?Problem: Health Behavior/Discharge Planning: ?Goal: Ability to manage health-related needs will improve ?Outcome: Progressing ?  ?Problem: Clinical Measurements: ?Goal: Will remain free from infection ?Outcome: Progressing ?  ?Problem: Clinical Measurements: ?Goal: Diagnostic test results will improve ?Outcome: Progressing ?  ?Problem: Clinical Measurements: ?Goal: Cardiovascular complication will be avoided ?Outcome: Progressing ?  ?

## 2018-05-29 NOTE — Progress Notes (Signed)
PT Cancellation Note  Patient Details Name: Mary [name] MRN: [id number] DOB: May 25, 1941   Cancelled Treatment:    Reason Eval/Treat Not Completed: Patient at procedure or test/unavailable attempted to work with patient however she had already left for cath lab. Will attempt to return if time/schedule allow and based on duration of any applicable bed rest orders   Deniece Ree PT, DPT, CBIS  Supplemental Physical Therapist [facility]    Pager [phone] Acute Rehab Office [phone]

## 2018-05-29 NOTE — Progress Notes (Addendum)
Progress Note  Patient Name: Mary [name] Date of Encounter: 05/29/2018  Primary Cardiologist: Sinclair Grooms, MD   Subjective   Patient is doing well post cath.  She says that her breathing is much better, nearly back to baseline.  Her edema is much improved but she still has ankle edema.  She says that she follows a low-sodium diet.  Inpatient Medications    Scheduled Meds: . aspirin EC  81 mg Oral Daily  . atorvastatin  80 mg Oral q1800  . buPROPion  150 mg Oral Daily  . gabapentin  300 mg Oral QHS  . heparin  5,000 Units Subcutaneous Q8H  . insulin aspart  0-20 Units Subcutaneous TID WC  . insulin aspart  0-5 Units Subcutaneous QHS  . latanoprost  1 drop Right Eye QHS  . levothyroxine  125 mcg Oral QAC breakfast  . metoprolol succinate  12.5 mg Oral QHS  . polyethylene glycol  17 g Oral BID  . sodium chloride flush  3 mL Intravenous Q12H   Continuous Infusions: . sodium chloride     PRN Meds: sodium chloride, acetaminophen, albuterol, HYDROcodone-acetaminophen, magnesium hydroxide, nitroGLYCERIN, ondansetron (ZOFRAN) IV, oxyCODONE, sodium chloride flush   Vital Signs    Vitals:   05/29/18 1030 05/29/18 1100 05/29/18 1102 05/29/18 1200  BP: (!) 110/53 98/68 101/71 (!) 105/47  Pulse: 73 75 75 69  Resp:      Temp:      TempSrc:      SpO2:      Weight:      Height:        Intake/Output Summary (Last 24 hours) at 05/29/2018 1557 Last data filed at 05/29/2018 1000 Gross per 24 hour  Intake 251.06 ml  Output 1500 ml  Net -1248.94 ml   Filed Weights   05/27/18 0447 05/28/18 0426 05/29/18 0100  Weight: 121.7 kg 120.1 kg 121.9 kg    Telemetry    AV pacing at 70 bpm- Personally Reviewed  ECG    No new tracings- Personally Reviewed  Physical Exam   GEN: No acute distress.   Neck: No JVD Cardiac: RRR, no murmurs, rubs, or gallops.  Respiratory: Clear to auscultation bilaterally. GI: Soft, nontender, non-distended  MS:  1+ ankle edema; No  deformity. Neuro:  Nonfocal  Psych: Normal affect   Labs    Chemistry Recent Labs  Lab 05/25/18 0531  05/27/18 0744 05/28/18 0500 05/29/18 0531  NA 138   < > 135 135 137  K 4.0   < > 4.0 3.8 3.9  CL 94*   < > 89* 89* 95*  CO2 35*   < > 33* 35* 34*  GLUCOSE 70   < > 138* 152* 112*  BUN 26*   < > 29* 31* 23  CREATININE 1.18*   < > 1.35* 1.27* 1.14*  CALCIUM 9.0   < > 9.4 9.1 9.4  PROT 6.7  --   --   --   --   ALBUMIN 3.0*  --   --   --   --   AST 26  --   --   --   --   ALT 19  --   --   --   --   ALKPHOS 103  --   --   --   --   BILITOT 0.5  --   --   --   --   GFRNONAA 44*   < > 37* 40* 46*  GFRAA 51*   < > 43* 46* 53*  ANIONGAP 9   < > 13 11 8    < > = values in this interval not displayed.     Hematology Recent Labs  Lab 05/26/18 0701 05/28/18 0500 05/29/18 0531  WBC 11.1* 10.9* 12.7*  RBC 3.77* 3.74* 3.71*  HGB 9.2* 9.3* 9.2*  HCT 31.2* 30.7* 30.8*  MCV 82.8 82.1 83.0  MCH 24.4* 24.9* 24.8*  MCHC 29.5* 30.3 29.9*  RDW 15.9* 15.8* 15.9*  PLT 461* 438* 458*    Cardiac EnzymesNo results for input(s): TROPONINI in the last 168 hours.  Recent Labs  Lab 05/23/18 1902  TROPIPOC 0.00     BNP Recent Labs  Lab 05/23/18 1840  BNP 196.3*     DDimer No results for input(s): DDIMER in the last 168 hours.   Radiology    No results found.  Cardiac Studies   Left heart cath 05/29/2018 LEFT HEART CATH AND CORONARY ANGIOGRAPHY  05/29/2018  Conclusion  When compared to prior coronary angiography, no significant change has occurred.  Proximal/ostial 25% left main.  Calcified proximal to mid LAD with 20 to 30% narrowing.  Distal to apical LAD diffusely involved with up to 50 to 70% narrowing.  Apical LAD actually appears improved compared to prior.  Circumflex is patent with irregularities noted in the circumflex and obtuse marginal branches up to 40 to 50%.  Diffuse luminal irregularities throughout the right coronary up to 40%.  Right coronary is  dominant.  Dyssynergy of LV contractility pattern likely related to RV pacing.  There may be focal apical akinesis.  EF is 50%.  LVEDP is normal.  RECOMMENDATIONS:  Primary risk prevention with aggressive lipid-lowering, hemoglobin A1c less than 7, weight loss, evaluation and treatment of probable sleep apnea, blood pressure target less than or equal to 130/80 mmHg, and moderate intensity aerobic activity greater than 150 minutes/week. No indication for antiplatelet therapy at this time.   Echocardiogram 05/26/2018 Study Conclusions - Left ventricle: Systolic function was normal. The estimated   ejection fraction was in the range of 55% to 60%. There is   hypokinesis of the apical septal myocardium.  Impressions: - Limited study with definity to evaluate wall motion; akinesis of   the distal septum with overall preserved LV function.   Patient Profile     77 y.o. 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  Echocardiogram September 2019 showed ejection fraction 40 to 45%, mild aortic stenosis, mild left atrial enlargement, mild to moderate tricuspid regurgitation and severe pulmonary hypertension.  Follow-up echocardiogram with [facility] showed ejection fraction 55 to 60% with hypokinesis of the apical septum. LHC on 05/29/18 showed no significant change from previous.  Assessment & Plan    Acute on chronic combined systolic and diastolic CHF -Patient is on Lasix 80 mg at home.  Has been receiving Lasix 40 mg IV twice daily.  Diuresis currently on hold for cardiac cath today. -Weight has  been slowly coming down from 273 pounds on admission to 268.8 pounds today.  Home weight when she is feeling well is around 261 pounds. -Echo showed preserved EF -Patient  is much improved after diuresis -She underwent cardiac cath today -Check renal function in the morning and resume diuresis if stable  Coronary artery disease -Echocardiogram showed preserved LV function, but new wall motion abnormalities -Left heart cath done today that showed no no changes from prior. -Recommend Primary risk prevention with aggressive lipid-lowering, hemoglobin A1c less than 7, weight loss, evaluation and treatment of probable sleep apnea, blood pressure target less than or equal to 130/80 mmHg, and moderate intensity aerobic activity greater than 150 minutes/week. No indication for antiplatelet therapy at this time. -Continue statin.  Does not need aspirin considering need for a apixaban  History of PAF and atrial tachycardia -Currently AV paced -Flecainide was discontinued given her history of CAD and she is continued on beta-blocker. -On a apixaban for stroke risk reduction.  Held for cardiac cath.  Resume this evening.  Iron deficiency anemia -Hemoglobin 9.2.  Management per primary team.  CKD stage III -Diuretics on hold currently for patient to undergo cardiac cath. -Recheck renal labs in a.m.  COPD exacerbation -On nebulizer, oxygen weaned to room air -Judgment per primary team      For questions or updates, please contact [facility] Please consult [email or web address] for contact info under        Signed, Daune Perch, NP  05/29/2018, 3:57 PM    ------------------------------------------------------------------------------------ History and all data above reviewed.  Patient examined.  I agree with the findings as above.    Yalissa Fink is feeling well after cath,   The patient exam reveals Gen: no acute distress CV: regular rhythm, normal rate. Radial access site bandaged but  perfusion to hand normal and no hematoma appreciated Lungs: clear bilaterally Abd: soft Extremities: 1+ edema bilaterally, with improvement since admission  All available labs, radiology testing, previous records reviewed. Agree with documented assessment and plan.  Mrs. Bansal angiogram performed by her primary cardiologist Dr. [name] demonstrates no progression of coronary artery disease since her previous angiogram and no concerning areas of plaque rupture in the preceding weeks to account for her apical and septal wall motion abnormality.  Given that we have ruled out significant coronary stenosis, I agree that it seems likely her wall motion abnormality is pacing related.  We will continue medical management of coronary artery disease and heart failure, and she should follow-up in 3 to 4 weeks post hospital discharge.  She tells me that she had in fact stopped her flecainide prior to this hospitalization per the guidance of elective physiology, we will leave this medication off for the time being and will seek EP guidance as an outpatient to determine if she needs other PAT or atrial fibrillation therapies.   Elouise Munroe  5:13 PM  05/29/2018

## 2018-05-29 NOTE — Progress Notes (Signed)
PROGRESS NOTE    Mary [name]  CZY:[id number] DOB: 1940-11-14 DOA: 05/23/2018 PCP: Hoyt Koch, MD  Brief Narrative:Mary [name] is a 77 y.o. female with medical history significant of A. fib, CHF, type 2 diabetes, presents with shortness of breath.  Patient was admitted a few days ago to [facility] for CHF exacerbation.  She was upset because she was unable to see cardiology there and left AMA yesterday.  Today she continued to have orthopnea, slight cough and shortness of breath.  Patient had a chest x-ray did show some vascular congestion .she was given 80 mg of IV Lasix in ED.  She had urine output x1.  Patient denies any known history of COPD but has a long history of tobacco use.  sHe initially became ill with cold-like symptoms 2 weeks ago.  She got better for short time but cough and orthopnea resumed .  Her lower extremities have been increasing in its swelling over the past week.  States she is compliant with her medications and follows with a local cardiologist here.  Patient denies any chest pain.     ED Course: Patient had a chest x-ray which showed show some vascular  Congestion.she was given 80 mg of IV Lasix in ED.    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 Chest x ray cardiomegaly, vascular congestion.  Takes 80 mg oral lasix at home.  Continue with 40 mg IV lasix BID. Hold lasix today in anticipation for cath tomorrow.  New oxygen requirement. tapering oxygen off.  Weight; 273---269--270--269---268 Cardiology consulted.  ECHO  with wall motion abnormalities.  Cath with no significant change from prior. Medical management.  Plan to resume lasix tomorrow.   2-Acute Bronchitis; COPD exacerbation  Used to smoke.  Schedule nebulizer. Change back to Q 6 hours.  Needs PFT outpatient.  Finished prednisone for 5 days.   Doxy 5 days.  Improving.   3-Acute hypoxic respiratory failure;  In setting on HF exacerbation and COPD.  IV lasix. Nebulizer.  New oxygen requirement. Was on 3 L. Now on Room air.    4-A fib; chronic;   eliquis. Flecainide discontinue due to new low EF.   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   iron level. Iron low. IV iron today.  Check B 12 level. Will give injection. B 12 low normal.  Monitor for blood loss on heparin.   6-CKD stage III Baseline 1.1-1.2  Monitor on lasix.  Cr at 1.3 monitor.   DM; hold lantus due to multiples episode of hypoglycemia and poor oral intake  SSI.   Constipation;  Continue with  Miralax.   DVT prophylaxis: heparin gtt prior to cath  Code Status: full code.  Family Communication: care discussed with patient.  Disposition Plan: plan to repeat cr tomorrow and resume lasix.   Consultants:   Cardiology    Procedures:   none   Antimicrobials: doxy    Subjective: Breathing better, dyspnea improved.   Objective: Vitals:   05/29/18  1030 05/29/18 1100 05/29/18 1102 05/29/18 1200  BP: (!) 110/53 98/68 101/71 (!) 105/47  Pulse: 73 75 75 69  Resp:      Temp:      TempSrc:      SpO2:      Weight:      Height:        Intake/Output Summary (Last 24 hours) at 05/29/2018 1537 Last data filed at 05/29/2018 1000 Gross per 24 hour  Intake 251.06 ml  Output 1500 ml  Net -1248.94 ml   Filed Weights   05/27/18 0447 05/28/18 0426 05/29/18 0100  Weight: 121.7 kg 120.1 kg 121.9 kg    Examination:  General exam: NAD Respiratory system; CTA Cardiovascular system; S 1, S 2 Gastrointestinal system: BS  present, soft, NT Central nervous system; non focal.  Extremities: trace edema Skin: No rashes.    Data Reviewed: I have personally reviewed following labs and imaging studies  CBC: Recent Labs  Lab 05/23/18 1840 05/24/18 0815 05/26/18 0701 05/28/18 0500 05/29/18 0531  WBC 12.8* 9.8 11.1* 10.9* 12.7*  HGB 9.2* 9.3* 9.2* 9.3* 9.2*  HCT 30.6* 30.7* 31.2* 30.7* 30.8*  MCV 82.5 81.6 82.8 82.1 83.0  PLT 420* 433* 461* 438* 353*   Basic Metabolic Panel: Recent Labs  Lab 05/25/18 0531 05/26/18 0701 05/27/18 0744 05/28/18 0500 05/29/18 0531  NA 138 139 135 135 137  K 4.0 4.0 4.0 3.8 3.9  CL 94* 91* 89* 89* 95*  CO2 35* 36* 33* 35* 34*  GLUCOSE 70 100* 138* 152* 112*  BUN 26* 28* 29* 31* 23  CREATININE 1.18* 1.14* 1.35* 1.27* 1.14*  CALCIUM 9.0 9.4 9.4 9.1 9.4   GFR: Estimated Creatinine Clearance: 56.8 mL/min (A) (by C-G formula based on SCr of 1.14 mg/dL (H)). Liver Function Tests: Recent Labs  Lab 05/25/18 0531  AST 26  ALT 19  ALKPHOS 103  BILITOT 0.5  PROT 6.7  ALBUMIN 3.0*   No results for input(s): LIPASE, AMYLASE in the last 168 hours. No results for input(s): AMMONIA in the last 168 hours. Coagulation Profile: No results for input(s): INR, PROTIME in the last 168 hours. Cardiac Enzymes: No results for input(s): CKTOTAL, CKMB, CKMBINDEX, TROPONINI in the last 168 hours. BNP (last 3 results) No results for input(s): PROBNP in the last 8760 hours. HbA1C: No results for input(s): HGBA1C in the last 72 hours. CBG: Recent Labs  Lab 05/28/18 1136 05/28/18 1631 05/28/18 2124 05/29/18 0722 05/29/18 1120  GLUCAP 244* 280* 170* 109* 106*   Lipid Profile: No results for input(s): CHOL, HDL, LDLCALC, TRIG, CHOLHDL, LDLDIRECT in the last 72 hours. Thyroid Function Tests: No results for input(s): TSH, T4TOTAL, FREET4, T3FREE, THYROIDAB in the last 72 hours. Anemia Panel: Recent Labs    05/28/18 0500  VITAMINB12 268   Sepsis Labs: No results for  input(s): PROCALCITON, LATICACIDVEN in the last 168 hours.  No results found for this or any previous visit (from the past 240 hour(s)).       Radiology Studies: No results found.      Scheduled Meds: . aspirin EC  81 mg Oral Daily  . atorvastatin  80 mg Oral q1800  . buPROPion  150 mg Oral Daily  . gabapentin  300 mg Oral QHS  . heparin  5,000 Units Subcutaneous Q8H  . insulin aspart  0-20 Units Subcutaneous TID WC  . insulin aspart  0-5 Units Subcutaneous QHS  . latanoprost  1 drop Right Eye QHS  . levothyroxine  125  mcg Oral QAC breakfast  . metoprolol succinate  12.5 mg Oral QHS  . polyethylene glycol  17 g Oral BID  . sodium chloride flush  3 mL Intravenous Q12H   Continuous Infusions: . sodium chloride       LOS: 5 days    Time spent: 35 minutes.     Elmarie Shiley, MD [facility] Pager [phone]  If 7PM-7AM, please contact night-coverage [email or web address] Password [facility] 05/29/2018, 3:37 PM

## 2018-05-29 NOTE — Progress Notes (Signed)
Received patient from Cardiac cath.  CCMD notified.  Patient denies any chest pain or shortness of breath. Right wrist TR band  10cc per report cath site level 0.

## 2018-05-29 NOTE — Progress Notes (Signed)
OT Cancellation Note  Patient Details Name: Mary [name] MRN: [id number] DOB: 1940/11/01   Cancelled Treatment:    Reason Eval/Treat Not Completed: Patient at procedure or test/ unavailable. Pt currently in cath lab.  [place], OTR/L Acute Rehab Services Pager [phone] Office [phone]    05/29/2018, 8:59 AM

## 2018-05-29 NOTE — Interval H&P Note (Signed)
Cath Lab Visit (complete for each Cath Lab visit)  Clinical Evaluation Leading to the Procedure:   ACS: No.  Non-ACS:    Anginal Classification: CCS III  Anti-ischemic medical therapy: Maximal Therapy (2 or more classes of medications)  Non-Invasive Test Results: No non-invasive testing performed  Prior CABG: No previous CABG      History and Physical Interval Note:  05/29/2018 8:30 AM  Mary [name]  has presented today for surgery, with the diagnosis of reduced EF  The various methods of treatment have been discussed with the patient and family. After consideration of risks, benefits and other options for treatment, the patient has consented to  Procedure(s): LEFT HEART CATH AND CORONARY ANGIOGRAPHY (N/A) as a surgical intervention .  The patient's history has been reviewed, patient examined, no change in status, stable for surgery.  I have reviewed the patient's chart and labs.  Questions were answered to the patient's satisfaction.     Belva Crome III

## 2018-05-30 ENCOUNTER — Ambulatory Visit: Payer: Medicare Other | Admitting: Interventional Cardiology

## 2018-05-30 LAB — GLUCOSE, CAPILLARY
GLUCOSE-CAPILLARY: 135 mg/dL — AB (ref 70–99)
Glucose-Capillary: 136 mg/dL — ABNORMAL HIGH (ref 70–99)

## 2018-05-30 LAB — BASIC METABOLIC PANEL
ANION GAP: 11 (ref 5–15)
BUN: 19 mg/dL (ref 8–23)
CO2: 30 mmol/L (ref 22–32)
Calcium: 9.1 mg/dL (ref 8.9–10.3)
Chloride: 96 mmol/L — ABNORMAL LOW (ref 98–111)
Creatinine, Ser: 1.23 mg/dL — ABNORMAL HIGH (ref 0.44–1.00)
GFR, EST AFRICAN AMERICAN: 48 mL/min — AB (ref >60)
GFR, EST NON AFRICAN AMERICAN: 42 mL/min — AB (ref >60)
Glucose, Bld: 143 mg/dL — ABNORMAL HIGH (ref 70–99)
POTASSIUM: 4.9 mmol/L (ref 3.5–5.1)
SODIUM: 137 mmol/L (ref 135–145)

## 2018-05-30 MED ORDER — ASPIRIN 81 MG PO TBEC: 81.0000 mg | DELAYED_RELEASE_TABLET | Freq: Every day | ORAL | 0 refills | Status: DC

## 2018-05-30 MED ORDER — BASAGLAR KWIKPEN 100 UNIT/ML ~~LOC~~ SOPN: 20.0000 [IU] | PEN_INJECTOR | Freq: Every day | SUBCUTANEOUS | 0 refills | Status: DC

## 2018-05-30 MED ORDER — ALBUTEROL SULFATE HFA 108 (90 BASE) MCG/ACT IN AERS: 2.0000 | INHALATION_SPRAY | Freq: Four times a day (QID) | RESPIRATORY_TRACT | 2 refills | Status: DC | PRN

## 2018-05-30 MED ORDER — CYANOCOBALAMIN 1000 MCG/ML IJ SOLN
1000.0000 ug | Freq: Once | INTRAMUSCULAR | Status: AC
  Administered 2018-05-30: 1000 ug via INTRAMUSCULAR
  Filled 2018-05-30: qty 1

## 2018-05-30 MED ORDER — FUROSEMIDE 80 MG PO TABS: 80.0000 mg | ORAL_TABLET | Freq: Every day | ORAL | Status: DC

## 2018-05-30 MED ORDER — POLYETHYLENE GLYCOL 3350 17 G PO PACK: 17.0000 g | PACK | Freq: Two times a day (BID) | ORAL | 0 refills | Status: DC

## 2018-05-30 NOTE — Care Management Note (Addendum)
Case Management Note  Patient Details  Name: Mary [name] MRN: [id number] Date of Birth: [birth year]  Subjective/Objective:   Pt admitted with CHF   - multiple readmits               Action/Plan: PTA independent from home with husband - pt uses cane in the home.  Pt has PCP and denied barriers with paying for medications.  Pt declined [facility] as recommended - pts husband will provide 24 hour supervision.  Pt informed CM that she is aware of the need for daily weights and low sodium adherence.  No other CM needs - CM signing off   Expected Discharge Date:  05/30/18               Expected Discharge Plan:  Home/Self Care  In-House Referral:     Discharge planning Services     Post Acute Care Choice:    Choice offered to:     DME Arranged:    DME Agency:     HH Arranged:    [place] Agency:     Status of Service:  Completed, signed off  If discussed at [employer] of Stay Meetings, dates discussed:    Additional Comments: CM discussed multiple readmits for CHF exacerbation - pt refused HH RN to help with disease management. [name], RN 05/30/2018, 1:14 PM

## 2018-05-30 NOTE — Care Management Important Message (Signed)
Important Message  Patient Details  Name: Mary [name] MRN: [id number] Date of Birth: 1941/08/29   Medicare Important Message Given:       Delorse Lek 05/30/2018, 1:27 PM

## 2018-05-30 NOTE — Consult Note (Signed)
   [facility] CM Inpatient Consult   05/30/2018  Mary [name] Jun 03, 1941 [id number]   Patient was screened for hospital admission with HF exacerbation.    Met with the patient regarding the benefits of [facility] Care Management services in the Medicare ACO. Explained that [facility] is a covered benefit of insurance. Review information for [facility] Care Management and a brochure was provided with contact information.  Explained that [facility] does not interfere with or replace any services arranged by the inpatient care management staff.   Patient endorses Dr. Pricilla Holm as her primary care provider.  This physician office provides the transition of care calls and follow up.  Patient states she has good transportation and medication management except, "I ran out of my insulin for a couple of days until I got my check.  We had a big car repair that month but we have been able to manage since then." Patient did accept a brochure, 24 hour nurse advise line with contact information.  She states, "I don't feel that I have any of the needs at this time but I will call if things changes for me."  Patient states she would accept a few calls.  Her score was medium at this time. Will assign to General EMMI follow up calls.  Patient politely declined services with [facility] Care Management.  For questions,   Natividad Brood, RN BSN [facility] Liaison  [phone] business mobile phone Toll free office [phone]

## 2018-05-30 NOTE — Progress Notes (Signed)
PT Cancellation Note  Patient Details Name: Mary [name] MRN: [id number] DOB: Dec 24, 1940   Cancelled Treatment:    Reason Eval/Treat Not Completed: Patient declined, no reason specified Patient adamantly and vehemently refuses PT today, stating "my feet are swollen, my legs are swollen, I'm not doing it, I'm ready to go!" Education provided regarding role and importance of activity in addressing all of her complaints however she continues to refuse PT. She appears to be discharging today, thank you for the opportunity to assist in the care of this patient.   Deniece Ree PT, DPT, CBIS  Supplemental Physical Therapist [facility]    Pager [phone] Acute Rehab Office [phone]

## 2018-05-30 NOTE — Discharge Summary (Signed)
Physician Discharge Summary  Mary [name] GYJ:[id number] DOB: 1940-11-15 DOA: 05/23/2018  PCP: Hoyt Koch, MD  Admit date: 05/23/2018 Discharge date: 05/30/2018  Admitted From: Home  Disposition:  Home   Recommendations for Outpatient Follow-up:  1. Follow up with PCP in 1-2 weeks 2. Please obtain BMP/CBC in one week 3. Follow up with Dr [name] for further care.  4. Needs PFT outpatient.   Home Health; no  Discharge Condition: Stable.  CODE STATUS: Full code.  Diet recommendation: Heart Healthy    Brief/Interim Summary:  Brief Narrative:Mary S Gordonis a 77 y.o.femalewith medical history significant ofA. fib, CHF, type 2 diabetes, presents with shortness of breath. Patient was admitted a few days ago to [facility] for CHF exacerbation. She was upset because she was unable to see cardiology there and left AMA yesterday. Today she continued to have orthopnea,slight cough and shortness of breath. Patient had a chest x-ray did show some vascular congestion .she was given 80 mg of IV Lasix in ED. She had urine output x1. Patient denies any known history of COPD but has a long history of tobacco use. sHe initially became ill with cold-like symptoms 2 weeks ago. She got better for short time but cough and orthopnearesumed. Her lower extremities have been increasing in its swelling over the past week. States she is compliant with her medications and follows with a local cardiologist here. Patient denies any chest pain.    ED Course:Patient had a chest x-raywhich showedshow some vascular Congestion.she was given 80 mg of IV Lasix in ED.    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 Chest x ray cardiomegaly, vascular congestion.  Takes 80 mg oral lasix at home.  Continue with 40 mg IV lasix BID. Hold lasix today in anticipation for cath tomorrow.  New oxygen requirement. tapering oxygen off.  Weight; 273---269--270--269---268 Cardiology consulted.  ECHO with wall motion abnormalities.  Cath with no significant change from prior. Medical management.  Plan to resume lasix today. Cr stable.   2-Acute Bronchitis; COPD exacerbation  Used to smoke.  Schedule nebulizer. Change back to Q 6 hours.  Needs PFT outpatient.  Finished prednisone for 5 days.   Doxy 5 days.  Improving.   3-Acute hypoxic respiratory failure;  In setting on HF exacerbation and COPD.  IV lasix. Nebulizer.  New oxygen requirement. Was on 3 L. Now on Room air.    4-A fib; chronic;   eliquis. Flecainide discontinue due to new low EF.   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   iron level. Iron low. IV iron today.  Check B 12 level. Will give injection prior to discharge. B 12 low normal.    6-CKD stage III Baseline 1.1-1.2  Monitor on lasix.  Cr at 1.2. Improved. Resume lasix.   DM; resume lantus lower home dose. She had hypoglycemia in the hospital  SSI.   Constipation;  Continue with  Miralax.  Had bm   Leukocytosis; due to prednisone. Need repeat labs.  Discharge Diagnoses:  Principal Problem:   Acute on chronic combined systolic and diastolic CHF (congestive heart failure) (HCC) Active Problems:   Obesity   CAD (coronary artery disease)   Diabetes mellitus with complication (HCC)   Hypertension   PAF (paroxysmal atrial fibrillation) (HCC)   Cardiac pacemaker in situ - St Jude   Depression   COPD with acute bronchitis ([place])    Discharge Instructions  Discharge  Instructions    Diet - low sodium heart healthy   Complete by:  As directed    Increase activity slowly   Complete by:  As directed      Allergies as of 05/30/2018      Reactions   Other Other (See Comments)   NO Blind Scopes with Naso Gastric tube.  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   Latex    Tape= burns   Nsaids Other (See Comments)   S/P gastric bypass-told not to take. GI bleeds with NSAIDS      Medication List    STOP taking these medications   flecainide 50 MG tablet Commonly known as:  TAMBOCOR     TAKE these medications   albuterol 108 (90 Base) MCG/ACT inhaler Commonly known as:  PROVENTIL HFA;VENTOLIN HFA Inhale 2 puffs into the lungs every 6 (six) hours as needed for wheezing or shortness of breath.   aspirin 81 MG EC tablet Take 1 tablet (81 mg total) by mouth daily. Start taking on:  05/31/2018   atorvastatin 80 MG tablet Commonly known as:  LIPITOR TAKE 1 TABLET(80 MG) BY MOUTH DAILY What changed:  See the new instructions.   BASAGLAR KWIKPEN 100 UNIT/ML Sopn Inject 0.2 mLs (20 Units total) into the skin daily. What changed:  how much to take   bimatoprost 0.01 % Soln Commonly known as:  LUMIGAN Place 1 drop into the right eye at bedtime.   buPROPion 150 MG 24 hr tablet Commonly known as:  WELLBUTRIN XL Take 1 tablet (150 mg total) by mouth daily.   ELIQUIS 5 MG Tabs tablet Generic drug:  apixaban TAKE 1 TABLET BY MOUTH TWICE A DAY What changed:  how much to take   furosemide 80 MG tablet Commonly known as:  LASIX Take 1 tablet (80 mg total) by mouth daily.   gabapentin 300 MG capsule Commonly known as:  NEURONTIN Take 300 mg by mouth at bedtime.   HYDROcodone-acetaminophen 5-325 MG tablet Commonly known as:  NORCO/VICODIN Take 1 tablet by mouth every 4 (four)  hours as needed for moderate pain.   levothyroxine 125 MCG tablet Commonly known as:  SYNTHROID, LEVOTHROID Take 125 mcg by mouth daily before breakfast.   metoprolol succinate 25 MG 24 hr tablet Commonly known as:  TOPROL-XL Take 0.5 tablets (12.5 mg total) by mouth at bedtime.   nitroGLYCERIN 0.4 MG SL tablet Commonly known as:  NITROSTAT Place 1 tablet (0.4 mg total) under the tongue every 5 (five) minutes as needed for chest pain.   polyethylene glycol packet Commonly known as:  MIRALAX / GLYCOLAX Take 17 g by mouth 2 (two) times daily.   potassium chloride SA 20 MEQ tablet Commonly known as:  K-DUR,KLOR-CON TAKE 2 TABLETS BY MOUTH ONCE DAILY      Follow-up Information    Pricilla Holm  A, MD Follow up in 1 week(s).   Specialty:  Internal Medicine Contact information: [place] [id number] [phone]        Belva Crome, MD .   Specialty:  Cardiology Contact information: [phone] [address] [place] [address] [phone]        [name], MD .   Specialty:  Cardiology Contact information: [address] [place] [address] [phone]          Allergies  Allergen Reactions  . Other Other (See Comments)    NO Blind Scopes with Naso Gastric tube.  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 Adhesive [Tape]     Tape - burns - pls use paper tape or elastic bandage  . Aspirin Other (See Comments)    S/P gastric bypass surgery, states her MD told her to not take Aspirin.  . Latex     Tape= burns  . Nsaids Other (See Comments)    S/P gastric bypass-told not to take. GI bleeds with NSAIDS    Consultations:  Cardiology    Procedures/Studies: Dg Chest 2 View  Result Date: 05/23/2018 CLINICAL DATA:  Nausea, shortness of Breath EXAM: CHEST - 2 VIEW COMPARISON:  05/21/2018 FINDINGS: Cardiomegaly, vascular congestion and peribronchial thickening. Left pacer remains in place, unchanged. No confluent  opacities or effusions. No acute bony abnormality. IMPRESSION: Cardiomegaly, vascular congestion. Mild bronchitic changes. Electronically Signed   By: Rolm Baptise M.D.   On: 05/23/2018 19:55   Dg Chest 2 View  Result Date: 05/21/2018 CLINICAL DATA:  Dyspnea EXAM: CHEST - 2 VIEW COMPARISON:  04/25/2017 chest radiograph. FINDINGS: Stable configuration of 2 lead left subclavian pacemaker. Stable cardiomediastinal silhouette with mild cardiomegaly. No pneumothorax. No pleural effusion. Cephalization of the pulmonary vasculature without overt pulmonary edema. No acute consolidative airspace disease. IMPRESSION: Mild cardiomegaly without overt pulmonary edema. Electronically Signed   By: Ilona Sorrel M.D.   On: 05/21/2018 19:01     Subjective: Breathing better, denies chest  pain   Discharge Exam: Vitals:   05/30/18 0458 05/30/18 1244  BP: 98/76 116/78  Pulse: 74 64  Resp: 18 18  Temp: 98.8 F (37.1 C) 98.5 F (36.9 C)  SpO2: 95% 90%   Vitals:   05/29/18 1918 05/29/18 2114 05/30/18 0458 05/30/18 1244  BP: 100/67 99/65 98/76  116/78  Pulse: 74 69 74 64  Resp: 18  18 18   Temp: 98.6 F (37 C)  98.8 F (37.1 C) 98.5 F (36.9 C)  TempSrc: Oral  Oral Oral  SpO2: 91%  95% 90%  Weight:   122.4 kg   Height:        General: Pt is alert, awake, not in acute distress Cardiovascular: RRR, S1/S2 +, no rubs, no gallops Respiratory: CTA bilaterally, no wheezing, no rhonchi Abdominal: Soft, NT, ND, bowel sounds + Extremities: no edema, no cyanosis    The results of significant diagnostics from this hospitalization (including imaging, microbiology, ancillary and laboratory) are listed below for reference.     Microbiology: No results found for this or any previous visit (from the past 240 hour(s)).   Labs: BNP (last 3 results) Recent Labs    05/21/18 1813 05/23/18 1840  BNP 257.3* 784.7*   Basic Metabolic Panel: Recent Labs  Lab 05/26/18 0701 05/27/18 0744 05/28/18 0500  05/29/18 0531 05/30/18 0435  NA 139 135 135 137 137  K 4.0 4.0 3.8 3.9 4.9  CL 91* 89* 89* 95* 96*  CO2 36* 33* 35* 34* 30  GLUCOSE 100* 138* 152* 112* 143*  BUN 28* 29* 31* 23 19  CREATININE 1.14* 1.35* 1.27* 1.14* 1.23*  CALCIUM 9.4 9.4 9.1 9.4 9.1   Liver Function Tests: Recent Labs  Lab 05/25/18 0531  AST 26  ALT 19  ALKPHOS 103  BILITOT 0.5  PROT 6.7  ALBUMIN 3.0*   No results for input(s): LIPASE, AMYLASE in the last 168 hours. No results for input(s): AMMONIA in the last 168 hours. CBC: Recent Labs  Lab 05/23/18 1840 05/24/18 0815 05/26/18 0701 05/28/18 0500 05/29/18 0531  WBC 12.8* 9.8 11.1* 10.9* 12.7*  HGB 9.2* 9.3* 9.2* 9.3* 9.2*  HCT 30.6* 30.7* 31.2* 30.7* 30.8*  MCV 82.5 81.6 82.8 82.1 83.0  PLT 420* 433* 461* 438* 458*   Cardiac Enzymes: No results for input(s): CKTOTAL, CKMB, CKMBINDEX, TROPONINI in the last 168 hours. BNP: Invalid input(s): POCBNP CBG: Recent Labs  Lab 05/29/18 1120 05/29/18 1626 05/29/18 2116 05/30/18 0744 05/30/18 1144  GLUCAP 106* 166* 196* 135* 136*   D-Dimer No results for input(s): DDIMER in the last 72 hours. Hgb A1c No results for input(s): HGBA1C in the last 72 hours. Lipid Profile No results for input(s): CHOL, HDL, LDLCALC, TRIG, CHOLHDL, LDLDIRECT in the last 72 hours. Thyroid function studies No results for input(s): TSH, T4TOTAL, T3FREE, THYROIDAB in the last 72 hours.  Invalid input(s): FREET3 Anemia work up Recent Labs    05/28/18 0500  VITAMINB12 268   Urinalysis    Component Value Date/Time   COLORURINE YELLOW 08/01/2016 [place] 08/01/2016 0539   LABSPEC 1.007 08/01/2016 0539   PHURINE 6.0 08/01/2016 [place] 08/01/2016 0539   HGBUR NEGATIVE 08/01/2016 0539   BILIRUBINUR NEGATIVE 08/01/2016 0539   KETONESUR NEGATIVE 08/01/2016 0539   PROTEINUR NEGATIVE 08/01/2016 0539   NITRITE NEGATIVE 08/01/2016 0539   LEUKOCYTESUR MODERATE (A) 08/01/2016 0539    Sepsis Labs Invalid input(s): PROCALCITONIN,  WBC,  LACTICIDVEN Microbiology No results found for this or any previous visit (from the past 240 hour(s)).   Time coordinating discharge: 35 minutes.   SIGNED:   Elmarie Shiley, MD  [facility] 05/30/2018, 12:47 PM Pager   If 7PM-7AM, please contact night-coverage [email or web address] Password [email or web address]

## 2018-05-30 NOTE — Progress Notes (Addendum)
Progress Note  Patient Name: Mary [name] Date of Encounter: 05/30/2018  Primary Cardiologist: Sinclair Grooms, MD   Subjective   No chest pain. Pt being discharged today. Will have office call her for follow up appointment with Dr. [name]   Inpatient Medications    Scheduled Meds: . apixaban  5 mg Oral BID  . aspirin EC  81 mg Oral Daily  . atorvastatin  80 mg Oral q1800  . buPROPion  150 mg Oral Daily  . cyanocobalamin  1,000 mcg Intramuscular Once  . gabapentin  300 mg Oral QHS  . insulin aspart  0-20 Units Subcutaneous TID WC  . insulin aspart  0-5 Units Subcutaneous QHS  . latanoprost  1 drop Right Eye QHS  . levothyroxine  125 mcg Oral QAC breakfast  . metoprolol succinate  12.5 mg Oral QHS  . polyethylene glycol  17 g Oral BID  . sodium chloride flush  3 mL Intravenous Q12H   Continuous Infusions: . sodium chloride     PRN Meds: sodium chloride, acetaminophen, albuterol, HYDROcodone-acetaminophen, magnesium hydroxide, nitroGLYCERIN, ondansetron (ZOFRAN) IV, oxyCODONE, sodium chloride flush   Vital Signs    Vitals:   05/29/18 1918 05/29/18 2114 05/30/18 0458 05/30/18 1244  BP: 100/67 99/65 98/76  116/78  Pulse: 74 69 74 64  Resp: 18  18 18   Temp: 98.6 F (37 C)  98.8 F (37.1 C) 98.5 F (36.9 C)  TempSrc: Oral  Oral Oral  SpO2: 91%  95% 90%  Weight:   122.4 kg   Height:        Intake/Output Summary (Last 24 hours) at 05/30/2018 1337 Last data filed at 05/30/2018 0900 Gross per 24 hour  Intake 1123 ml  Output 500 ml  Net 623 ml   Filed Weights   05/28/18 0426 05/29/18 0100 05/30/18 0458  Weight: 120.1 kg 121.9 kg 122.4 kg   Physical Exam   General: Obese, NAD Skin: Warm, dry, intact  Head: Normocephalic, atraumatic, clear, moist mucus membranes. Neck: Negative for carotid bruits. No JVD Lungs:Clear to ausculation bilaterally. No wheezes, rales, or rhonchi. Breathing is unlabored. Cardiovascular: RRR with S1 S2. No murmurs, rubs,  gallops, or LV heave appreciated. Abdomen: Soft, non-tender, non-distended with normoactive bowel sounds. No obvious abdominal masses. MSK: Strength and tone appear normal for age. 5/5 in all extremities Extremities: No edema. No clubbing or cyanosis. DP/PT pulses 2+ bilaterally Neuro: Alert and oriented. No focal deficits. No facial asymmetry. MAE spontaneously. Psych: Responds to questions appropriately with normal affect.    Labs    Chemistry Recent Labs  Lab 05/25/18 0531  05/28/18 0500 05/29/18 0531 05/30/18 0435  NA 138   < > 135 137 137  K 4.0   < > 3.8 3.9 4.9  CL 94*   < > 89* 95* 96*  CO2 35*   < > 35* 34* 30  GLUCOSE 70   < > 152* 112* 143*  BUN 26*   < > 31* 23 19  CREATININE 1.18*   < > 1.27* 1.14* 1.23*  CALCIUM 9.0   < > 9.1 9.4 9.1  PROT 6.7  --   --   --   --   ALBUMIN 3.0*  --   --   --   --   AST 26  --   --   --   --   ALT 19  --   --   --   --   ALKPHOS 103  --   --   --   --  BILITOT 0.5  --   --   --   --   GFRNONAA 44*   < > 40* 46* 42*  GFRAA 51*   < > 46* 53* 48*  ANIONGAP 9   < > 11 8 11    < > = values in this interval not displayed.     Hematology Recent Labs  Lab 05/26/18 0701 05/28/18 0500 05/29/18 0531  WBC 11.1* 10.9* 12.7*  RBC 3.77* 3.74* 3.71*  HGB 9.2* 9.3* 9.2*  HCT 31.2* 30.7* 30.8*  MCV 82.8 82.1 83.0  MCH 24.4* 24.9* 24.8*  MCHC 29.5* 30.3 29.9*  RDW 15.9* 15.8* 15.9*  PLT 461* 438* 458*    Cardiac EnzymesNo results for input(s): TROPONINI in the last 168 hours.  Recent Labs  Lab 05/23/18 1902  TROPIPOC 0.00     BNP Recent Labs  Lab 05/23/18 1840  BNP 196.3*     DDimer No results for input(s): DDIMER in the last 168 hours.   Radiology    No results found.  Telemetry    05/30/18 Paced- Personally Reviewed  ECG    No new tracing as of 05/30/18- Personally Reviewed  Cardiac Studies   Left heart cath 05/29/2018 LEFT HEART CATH AND CORONARY ANGIOGRAPHY  05/29/2018  Conclusion  When compared to  prior coronary angiography, no significant change has occurred.  Proximal/ostial 25% left main.  Calcified proximal to mid LAD with 20 to 30% narrowing. Distal to apical LAD diffusely involved with up to 50 to 70% narrowing. Apical LAD actually appears improved compared to prior.  Circumflex is patent with irregularities noted in the circumflex and obtuse marginal branches up to 40 to 50%.  Diffuse luminal irregularities throughout the right coronary up to 40%. Right coronary is dominant.  Dyssynergy of LV contractility pattern likely related to RV pacing. There may be focal apical akinesis. EF is 50%. LVEDP is normal.  RECOMMENDATIONS:  Primary risk prevention with aggressive lipid-lowering, hemoglobin A1c less than 7, weight loss, evaluation and treatment of probable sleep apnea, blood pressure target less than or equal to 130/80 mmHg, and moderate intensity aerobic activity greater than 150 minutes/week. No indication for antiplatelet therapy at this time.   Echocardiogram 05/26/2018 Study Conclusions - Left ventricle: Systolic function was normal. The estimated ejection fraction was in the range of 55% to 60%. There is hypokinesis of the apical septal myocardium.  Impressions: - Limited study with definity to evaluate wall motion; akinesis of the distal septum with overall preserved LV function.  Patient Profile     77 y.o. 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 Echocardiogram September 2019 showed ejection fraction 40 to 45%, mild aortic stenosis, mild left  atrial enlargement, mild to moderate tricuspid regurgitation and severe pulmonary hypertension. Follow-up echocardiogram with [facility] showed ejection fraction 55 to 60% with hypokinesis of the apical septum. LHC on 05/29/18 showed no significant change from previous.  Assessment & Plan    1. Acute on chronic combined systolic and diastolic CHF: -On Lasix 40mg  IV with good diuresis  -Weight, 269lb today, 273lb on admission  -I&O, net negative 8.8L since admission  -Cardiac cath with CAD  however no change from prior studies >>focus on primary risk prevention  -Echocardiogram with LVEF of 55% to 60% with hypokinesis of the apical septal myocardium  2. Coronary artery disease: -Echocardiogram with preserved EF however with new wall motion abnormalities  -Heart cath from 05/29/18 with no changes from prior cath with recommendations for primary risk prevention strategies -Continue statin, ASA, Toprol-XL   3. History of PAF and atrial tachycardia: -Currently AV paced>>>Flecainide discontinued secondary to known CAD with plans for rate control with beta-blocker  -Eliquis for stroke risk prevention>>>held initially for cath>>resumed evening of cath 05/29/18   4. Iron deficiency anemia: -Hb stable at 9.2 today  -Per primary team   5. CKD stage III: -Creatinine, 1.23 today>>up from 1.14 yesterday  -Baseline appears to be in the 1.0-1.1 range  -Resume diuretic today   6. COPD exacerbation: -Stable, per primary team  -No breathing concerns   7. DM2: -SSI for glucose control while inpatient status  -Management per primary team    Signed, Kathyrn Drown NP-C [facility] Pager: [phone] 05/30/2018, 1:37 PM     For questions or updates, please contact   Please consult [email or web address] for contact info under Cardiology/STEMI.  ------------------------------------------------------------------------------------   Attending Cardiologist note:  History and all data above reviewed.   Patient examined.  I agree with the findings as above.  Mary [name] was seen in her room today with her husband present.  She had no additional questions prior to her discharge from a cardiovascular perspective.  They look forward to following with Dr. [name] as an outpatient.  The patient exam reveals: Gen: no acute distress Extremities: 1+ edema bilaterally, with improvement since admission  All available labs, radiology testing, previous records reviewed. Agree with documented assessment and plan of my colleague as stated above with the following additions or changes:  Principal Problem:   Acute on chronic combined systolic and diastolic CHF (congestive heart failure) ([place]) Active Problems:   Obesity   CAD (coronary artery disease)   Diabetes mellitus with complication (HCC)   Hypertension   PAF (paroxysmal atrial fibrillation) (HCC)   Cardiac pacemaker in situ - St Jude   Depression   COPD with acute bronchitis ([place])   Plan: Plan to continue medical management of coronary artery disease and heart failure.  Follow-up with Dr. [name] and Dr. Rayann Heman as needed.  Elouise Munroe, MD [facility] 2:10 PM  05/30/2018

## 2018-05-30 NOTE — Progress Notes (Addendum)
Patient was discharged and transported in a wheelchair by tech in company of  family member. Patient denied chest pain or shortness of breath. PIV removed no signs and symptoms of swelling and redness noted. D/C instructions and follow up appointments discussed with patient and verbalized understanding.

## 2018-05-31 ENCOUNTER — Encounter: Payer: Self-pay | Admitting: Internal Medicine

## 2018-06-01 ENCOUNTER — Other Ambulatory Visit: Payer: Self-pay | Admitting: Internal Medicine

## 2018-06-01 ENCOUNTER — Telehealth: Payer: Self-pay | Admitting: *Deleted

## 2018-06-01 NOTE — Telephone Encounter (Signed)
Called pt concerning appt that was made for 06/06/18. Pt states she is aware of appt. Inform had some additional questions concerning discharge. Completed TCM below.Mary [name]  Transition Care Management Follow-up Telephone Call   Date discharged? 05/30/18   How have you been since you were released from the hospital? Pt states she is doing alright   Do you understand why you were in the hospital? YES   Do you understand the discharge instructions? YES   Where were you discharged to? Home   Items Reviewed:  Medications reviewed: YES  Allergies reviewed: YES  Dietary changes reviewed: YES, heart healthy  Referrals reviewed: YES, will see cardiology next week for follow-up   Functional Questionnaire:   Activities of Daily Living (ADLs):   She states she are independent in the following: ambulation, bathing and hygiene, feeding, continence, grooming, toileting and dressing States she doesn't require assistance    Any transportation issues/concerns?: NO   Any patient concerns? NO   Confirmed importance and date/time of follow-up visits scheduled YES, 06/06/18  Provider Appointment booked with Dr. Sharlet Salina   Confirmed with patient if condition begins to worsen call PCP or go to the ER.  Patient was given the office number and encouraged to call back with question or concerns.  : YES

## 2018-06-06 ENCOUNTER — Ambulatory Visit (INDEPENDENT_AMBULATORY_CARE_PROVIDER_SITE_OTHER): Payer: Medicare Other | Admitting: Internal Medicine

## 2018-06-06 ENCOUNTER — Encounter: Payer: Self-pay | Admitting: Internal Medicine

## 2018-06-06 ENCOUNTER — Other Ambulatory Visit (INDEPENDENT_AMBULATORY_CARE_PROVIDER_SITE_OTHER): Payer: Medicare Other

## 2018-06-06 VITALS — BP 130/60 | HR 80 | Temp 98.0°F | Ht 67.0 in | Wt 263.0 lb

## 2018-06-06 DIAGNOSIS — J449 Chronic obstructive pulmonary disease, unspecified: Secondary | ICD-10-CM

## 2018-06-06 DIAGNOSIS — I5043 Acute on chronic combined systolic (congestive) and diastolic (congestive) heart failure: Secondary | ICD-10-CM | POA: Diagnosis not present

## 2018-06-06 DIAGNOSIS — G4733 Obstructive sleep apnea (adult) (pediatric): Secondary | ICD-10-CM | POA: Diagnosis not present

## 2018-06-06 DIAGNOSIS — I10 Essential (primary) hypertension: Secondary | ICD-10-CM | POA: Diagnosis not present

## 2018-06-06 LAB — COMPREHENSIVE METABOLIC PANEL
ALK PHOS: 102 U/L (ref 39–117)
ALT: 11 U/L (ref 0–35)
AST: 13 U/L (ref 0–37)
Albumin: 3.8 g/dL (ref 3.5–5.2)
BILIRUBIN TOTAL: 0.6 mg/dL (ref 0.2–1.2)
BUN: 17 mg/dL (ref 6–23)
CALCIUM: 9.2 mg/dL (ref 8.4–10.5)
CO2: 31 mEq/L (ref 19–32)
CREATININE: 1.33 mg/dL — AB (ref 0.40–1.20)
Chloride: 96 mEq/L (ref 96–112)
GFR: 41.17 mL/min — ABNORMAL LOW (ref >60.00)
GLUCOSE: 188 mg/dL — AB (ref 70–99)
Potassium: 3.4 mEq/L — ABNORMAL LOW (ref 3.5–5.1)
Sodium: 138 mEq/L (ref 135–145)
TOTAL PROTEIN: 7.5 g/dL (ref 6.0–8.3)

## 2018-06-06 LAB — CBC
HCT: 33.7 % — ABNORMAL LOW (ref 36.0–46.0)
Hemoglobin: 10.7 g/dL — ABNORMAL LOW (ref 12.0–15.0)
MCHC: 31.7 g/dL (ref 30.0–36.0)
MCV: 81.2 fl (ref 78.0–100.0)
PLATELETS: 408 10*3/uL — AB (ref 150.0–400.0)
RBC: 4.15 Mil/uL (ref 3.87–5.11)
RDW: 17.8 % — ABNORMAL HIGH (ref 11.5–15.5)
WBC: 8.9 10*3/uL (ref 4.0–10.5)

## 2018-06-06 NOTE — Progress Notes (Signed)
   Subjective:    Patient ID: Mary [name], female    DOB: 06/15/1941, 77 y.o.   MRN: [id number]  HPI The patient is a 77 YO female coming in for hospital follow up (in for acute on chronic systolic heart failure, catheterization during stay with medical therapy advised, diuresed and then discharged). She has been stable since leaving the hospital. Denies falls. Wearing compression stockings. Did have more swelling and we advised her to double lasix which she has done with good relief. Denies fevers or chills. Some mild SOB which is stable. She denies chest pains. Denies diarrhea or constipation. Is still some fatigued but trying to do more activity at home. Using cane sometimes. Breathing is markedly improved from flare. Cough is minimal. Weighing daily and no sustained weight gain.  PMH, [facility], social history reviewed and updated  Review of Systems  Constitutional: Positive for activity change and fatigue. Negative for appetite change, chills, fever and unexpected weight change.  HENT: Negative.   Eyes: Negative.   Respiratory: Negative for cough, chest tightness and shortness of breath.   Cardiovascular: Negative for chest pain, palpitations and leg swelling.  Gastrointestinal: Negative for abdominal distention, abdominal pain, constipation, diarrhea, nausea and vomiting.  Musculoskeletal: Negative.   Skin: Negative.   Neurological: Negative.   Psychiatric/Behavioral: Negative.       Objective:   Physical Exam  Constitutional: She is oriented to person, place, and time. She appears well-developed and well-nourished.  Overweight  HENT:  Head: Normocephalic and atraumatic.  Eyes: EOM are normal.  Neck: Normal range of motion.  Cardiovascular: Normal rate and regular rhythm.  Pulmonary/Chest: Effort normal and breath sounds normal. No respiratory distress. She has no wheezes. She has no rales.  Abdominal: Soft. Bowel sounds are normal. She exhibits no distension. There is no tenderness.  There is no rebound.  Musculoskeletal: She exhibits edema. She exhibits no tenderness.  1+ bilaterally, compression stockings on  Neurological: She is alert and oriented to person, place, and time. Coordination normal.  Cane  Skin: Skin is warm and dry.  Psychiatric: She has a normal mood and affect.   Vitals:   06/06/18 1434  BP: 130/60  Pulse: 80  Temp: 98 F (36.7 C)  TempSrc: Oral  SpO2: 95%  Weight: 263 lb (119.3 kg)  Height: 5\' 7"  (1.702 m)      Assessment & Plan:

## 2018-06-06 NOTE — Patient Instructions (Signed)
We will check the labs today and call you back about the results.   We will get the home sleep study.

## 2018-06-07 ENCOUNTER — Encounter: Payer: Self-pay | Admitting: Internal Medicine

## 2018-06-07 ENCOUNTER — Ambulatory Visit (INDEPENDENT_AMBULATORY_CARE_PROVIDER_SITE_OTHER): Payer: Medicare Other | Admitting: Internal Medicine

## 2018-06-07 VITALS — BP 154/68 | HR 80 | Ht 67.0 in | Wt 259.6 lb

## 2018-06-07 DIAGNOSIS — I471 Supraventricular tachycardia: Secondary | ICD-10-CM

## 2018-06-07 DIAGNOSIS — I25118 Atherosclerotic heart disease of native coronary artery with other forms of angina pectoris: Secondary | ICD-10-CM

## 2018-06-07 DIAGNOSIS — I442 Atrioventricular block, complete: Secondary | ICD-10-CM

## 2018-06-07 DIAGNOSIS — G4733 Obstructive sleep apnea (adult) (pediatric): Secondary | ICD-10-CM | POA: Insufficient documentation

## 2018-06-07 DIAGNOSIS — I10 Essential (primary) hypertension: Secondary | ICD-10-CM | POA: Diagnosis not present

## 2018-06-07 DIAGNOSIS — I4719 Other supraventricular tachycardia: Secondary | ICD-10-CM

## 2018-06-07 LAB — CUP PACEART INCLINIC DEVICE CHECK
Battery Remaining Longevity: 87 mo
Battery Voltage: 2.98 V
Brady Statistic RA Percent Paced: 87 %
Implantable Lead Implant Date: 20150506
Implantable Lead Implant Date: 20150506
Implantable Lead Location: 753860
Lead Channel Impedance Value: 362.5 Ohm
Lead Channel Pacing Threshold Amplitude: 0.75 V
Lead Channel Pacing Threshold Amplitude: 0.75 V
Lead Channel Pacing Threshold Amplitude: 0.75 V
Lead Channel Pacing Threshold Pulse Width: 0.4 ms
Lead Channel Pacing Threshold Pulse Width: 0.4 ms
Lead Channel Pacing Threshold Pulse Width: 0.4 ms
Lead Channel Sensing Intrinsic Amplitude: 0.8 mV
Lead Channel Setting Pacing Amplitude: 2.5 V
Lead Channel Setting Pacing Amplitude: 2.5 V
MDC IDC LEAD LOCATION: 753859
MDC IDC MSMT LEADCHNL RV IMPEDANCE VALUE: 425 Ohm
MDC IDC MSMT LEADCHNL RV PACING THRESHOLD AMPLITUDE: 0.75 V
MDC IDC MSMT LEADCHNL RV PACING THRESHOLD PULSEWIDTH: 0.4 ms
MDC IDC MSMT LEADCHNL RV SENSING INTR AMPL: 12 mV
MDC IDC PG IMPLANT DT: 20150506
MDC IDC PG SERIAL: 3013730
MDC IDC SESS DTM: 20191009160119
MDC IDC SET LEADCHNL RV PACING PULSEWIDTH: 0.4 ms
MDC IDC SET LEADCHNL RV SENSING SENSITIVITY: 4 mV
MDC IDC STAT BRADY RV PERCENT PACED: 99.99 %

## 2018-06-07 NOTE — Assessment & Plan Note (Signed)
Appears to be resolved. Will keep current lasix dosing. Checking CBC and CMP today and adjust if needed for kidney function.

## 2018-06-07 NOTE — Assessment & Plan Note (Signed)
Prior diagnosis, not on current therapy. Was told she was stopping breathing at the hospital while sleeping. Ordered home sleep test.

## 2018-06-07 NOTE — Assessment & Plan Note (Signed)
Controlled currently without flare. Using albuterol prn.

## 2018-06-07 NOTE — Patient Instructions (Signed)
Medication Instructions:  Your physician has recommended you make the following change in your medication:   STOP: Aspirin   If you need a refill on your cardiac medications before your next appointment, please call your pharmacy.   Lab work: None Ordered  If you have labs (blood work) drawn today and your tests are completely normal, you will receive your results only by: Marland Kitchen MyChart Message (if you have MyChart) OR . A paper copy in the mail If you have any lab test that is abnormal or we need to change your treatment, we will call you to review the results.  Testing/Procedures: None ordered  Follow-Up: . Your physician recommends that you schedule a follow-up appointment in: 6 months with Chanetta Marshall, NP .   Any Other Special Instructions Will Be Listed Below (If Applicable).

## 2018-06-07 NOTE — Assessment & Plan Note (Signed)
BP at goal on lasix and metoprolol. Checking CMP and adjust as needed.

## 2018-06-07 NOTE — Progress Notes (Signed)
PCP: Hoyt Koch, MD Primary Cardiologist: Dr [name] Primary EP:  Dr Rayann Heman  Mary [name] is a 77 y.o. female who presents today for routine electrophysiology followup. She was recently hospitalized with acute on chronic combined systolic and diastolic dysfunction.  She was diuresed 8L.  She had cath which was stable.  Though she did not have obstructive CAD, her flecainide was discontinued with plans for rate control.  Its does not appear that EP was part of this decision.  Today, she denies symptoms of palpitations, chest pain, dizziness, presyncope, or syncope. She continues to have SOB.  She was supposed to follow-up with Dr [name] but appears to have been scheduled to see me by mistake.  The patient is otherwise without complaint today.   Past Medical History:  Diagnosis Date  . Arthritis   . Atrial tachycardia ([place]) 04/23/2017  . CAD (coronary artery disease)    non obstructive  . 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 Procedure complicated by an atrial lead dislodgment and repeat procedure, 12/2013   . Carotid stenosis    Carotid US 5/16:  Bilateral ICA 1-39%; L vertebral retrograde; L BP 126/49, R BP 140/57  . Chronic combined systolic and diastolic CHF (congestive heart failure) ([place])   . Chronic lower back pain   . Colon cancer ([place])    a. s/p chemo  . Colon polyps   . Diabetic peripheral neuropathy ([place])   . DVT (deep venous thrombosis) ([place]) 2013   "twice behind knee on left side" (06/25/2013)  . Glaucoma   . Hyperlipidemia   . Hypertension   . Hypothyroidism   . Iron deficiency anemia   . Multinodular goiter   . Osteoporosis   . PAF (paroxysmal atrial fibrillation) ([place])   . Sleep apnea    a. resolved post weight loss   . Type II diabetes mellitus ([place])    Past Surgical History:  Procedure Laterality Date  . CARDIAC CATHETERIZATION  2006   Never had PCI, 3 caths total. Last one in [place]    . CARDIAC CATHETERIZATION N/A 02/04/2015   Procedure: Left Heart Cath and Coronary Angiography;  Surgeon: Belva Crome, MD;  Location: [facility];  Service: Cardiovascular;  Laterality: N/A;  . CATARACT EXTRACTION, BILATERAL Bilateral 2013   "and put stent in my left eye for glaucoma" (06/25/2013)  . CHOLECYSTECTOMY  1980's  . COLECTOMY  10/2010   Tumor removal  . EYE MUSCLE SURGERY Bilateral ~ 1963   "muscles too long; eyes would go out and up; tied muscles to hold my eyes straight" (06/25/2013)  . LEAD REVISION  01-03-2014   atrial lead revision by Dr Rayann Heman  . LEAD REVISION N/A 01/03/2014   Procedure: LEAD REVISION;  Surgeon: Coralyn Mark, MD;  Location: [facility];  Service: Cardiovascular;  Laterality: N/A;  . LEFT HEART CATH AND CORONARY ANGIOGRAPHY N/A 05/29/2018   Procedure: LEFT HEART CATH AND CORONARY ANGIOGRAPHY;  Surgeon: Belva Crome, MD;  Location: [facility];  Service: Cardiovascular;  Laterality: N/A;  . PACEMAKER INSERTION  01/02/2014   STJ Assurity dual chamber pacemaker implnated by Dr Lovena Le for SSS  . PERMANENT PACEMAKER INSERTION N/A 01/02/2014   Procedure: PERMANENT PACEMAKER INSERTION;  Surgeon: Evans Lance, MD;  Location: [facility];  Service: Cardiovascular;  Laterality: N/A;  . PORTACATH PLACEMENT Left 11/2010  . ROUX-EN-Y GASTRIC BYPASS  2009  . ULNAR  TUNNEL RELEASE Left ~ 2008  . UMBILICAL HERNIA REPAIR  1970's?    (06/25/2013)    ROS- all systems are reviewed and negative except as per HPI above  Current Outpatient Medications  Medication Sig Dispense Refill  . albuterol (PROVENTIL HFA;VENTOLIN HFA) 108 (90 Base) MCG/ACT inhaler Inhale 2 puffs into the lungs every 6 (six) hours as needed for wheezing or shortness of breath. 1 Inhaler 2  . aspirin EC 81 MG EC tablet Take 1 tablet (81 mg total) by mouth daily. 30 tablet 0  . atorvastatin (LIPITOR) 80 MG tablet TAKE 1 TABLET(80 MG) BY MOUTH DAILY (Patient taking differently: Take 80 mg by  mouth daily at 6 PM. ) 90 tablet 0  . bimatoprost (LUMIGAN) 0.01 % SOLN Place 1 drop into the right eye at bedtime.     Marland Kitchen buPROPion (WELLBUTRIN XL) 150 MG 24 hr tablet Take 1 tablet (150 mg total) by mouth daily. 90 tablet 1  . ELIQUIS 5 MG TABS tablet TAKE 1 TABLET BY MOUTH TWICE A DAY (Patient taking differently: Take 5 mg by mouth 2 (two) times daily. ) 180 tablet 1  . furosemide (LASIX) 80 MG tablet TAKE 1 TABLET(80 MG) BY MOUTH DAILY 30 tablet 2  . gabapentin (NEURONTIN) 300 MG capsule Take 300 mg by mouth at bedtime.     Marland Kitchen HYDROcodone-acetaminophen (NORCO/VICODIN) 5-325 MG tablet Take 1 tablet by mouth every 4 (four) hours as needed for moderate pain. 30 tablet 0  . Insulin Glargine (BASAGLAR KWIKPEN) 100 UNIT/ML SOPN Inject 0.2 mLs (20 Units total) into the skin daily. 10 pen 0  . levothyroxine (SYNTHROID, LEVOTHROID) 125 MCG tablet Take 125 mcg by mouth daily before breakfast.  5  . metoprolol succinate (TOPROL XL) 25 MG 24 hr tablet Take 0.5 tablets (12.5 mg total) by mouth at bedtime. 90 tablet 3  . nitroGLYCERIN (NITROSTAT) 0.4 MG SL tablet Place 1 tablet (0.4 mg total) under the tongue every 5 (five) minutes as needed for chest pain. 75 tablet 2  . polyethylene glycol (MIRALAX / GLYCOLAX) packet Take 17 g by mouth 2 (two) times daily. 14 each 0  . potassium chloride SA (K-DUR,KLOR-CON) 20 MEQ tablet TAKE 2 TABLETS BY MOUTH ONCE DAILY (Patient taking differently: Take 40 mEq by mouth daily. ) 180 tablet 2   No current facility-administered medications for this visit.     Physical Exam: Vitals:   06/07/18 1458  BP: (!) 154/68  Pulse: 80  SpO2: 96%  Weight: 259 lb 9.6 oz (117.8 kg)  Height: 5\' 7"  (1.702 m)    GEN- The patient is well appearing, alert and oriented x 3 today.   Head- normocephalic, atraumatic Eyes-  Sclera clear, conjunctiva pink Ears- hearing intact Oropharynx- clear Lungs- Clear to ausculation bilaterally, normal work of breathing Chest- pacemaker pocket is  well healed Heart- Regular rate and rhythm, no murmurs, rubs or gallops, PMI not laterally displaced GI- soft, NT, ND, + BS Extremities- no clubbing, cyanosis, or edema  Pacemaker interrogation- reviewed in detail today,  See PACEART report  ekg tracing ordered today is personally reviewed and shows sinus rhythm with V pacing  Assessment and Plan:  1. 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 Ectopic atrial tachycardia Unfortunately, flecainide was stopped with recent admission, though she has not had obstructive CAD Currently no arrhythmias by device interrogation Continue to follow Could  consider restarting flecainide vs tikosyn or amioadrone.  For now, will follow conservatively.  I think when you look at her overall picture and risks/ benefits associated with options, she would be best to return to flecainide as she did very well with this medicine and has not had obstructive CAD On eliquis Stop ASA  3. HTN Stable No change required today  4. Obesity Body mass index is 40.66 kg/m. Wt Readings from Last 3 Encounters:  06/07/18 259 lb 9.6 oz (117.8 kg)  06/06/18 263 lb (119.3 kg)  05/30/18 269 lb 12.8 oz (122.4 kg)  lifestyle modification   5. Nonobstructive CAD Stop ASA Follow-up with Dr Leonia Reader Return to see EP NP every 6 months  [name] MD, [facility] 06/07/2018 3:11 PM

## 2018-06-13 ENCOUNTER — Encounter: Payer: Self-pay | Admitting: Internal Medicine

## 2018-06-13 MED ORDER — FUROSEMIDE 80 MG PO TABS: 80.0000 mg | ORAL_TABLET | Freq: Two times a day (BID) | ORAL | 1 refills | Status: DC

## 2018-06-19 ENCOUNTER — Ambulatory Visit (INDEPENDENT_AMBULATORY_CARE_PROVIDER_SITE_OTHER): Payer: Medicare Other | Admitting: *Deleted

## 2018-06-19 DIAGNOSIS — I442 Atrioventricular block, complete: Secondary | ICD-10-CM | POA: Diagnosis not present

## 2018-06-19 NOTE — Progress Notes (Signed)
Remote pacemaker transmission.   

## 2018-06-20 ENCOUNTER — Ambulatory Visit (INDEPENDENT_AMBULATORY_CARE_PROVIDER_SITE_OTHER): Payer: Medicare Other | Admitting: Cardiology

## 2018-06-20 ENCOUNTER — Encounter: Payer: Self-pay | Admitting: Cardiology

## 2018-06-20 ENCOUNTER — Ambulatory Visit
Admission: RE | Admit: 2018-06-20 | Discharge: 2018-06-20 | Disposition: A | Discharge status: Home / Self Care | Payer: Medicare Other | Source: Ambulatory Visit | Attending: Cardiology | Admitting: Cardiology

## 2018-06-20 VITALS — BP 120/68 | HR 103 | Ht 67.0 in | Wt 266.8 lb

## 2018-06-20 DIAGNOSIS — I509 Heart failure, unspecified: Secondary | ICD-10-CM

## 2018-06-20 DIAGNOSIS — I5033 Acute on chronic diastolic (congestive) heart failure: Secondary | ICD-10-CM

## 2018-06-20 DIAGNOSIS — I25118 Atherosclerotic heart disease of native coronary artery with other forms of angina pectoris: Secondary | ICD-10-CM | POA: Diagnosis not present

## 2018-06-20 DIAGNOSIS — I480 Paroxysmal atrial fibrillation: Secondary | ICD-10-CM

## 2018-06-20 DIAGNOSIS — D649 Anemia, unspecified: Secondary | ICD-10-CM | POA: Diagnosis not present

## 2018-06-20 DIAGNOSIS — I10 Essential (primary) hypertension: Secondary | ICD-10-CM

## 2018-06-20 DIAGNOSIS — R0602 Shortness of breath: Secondary | ICD-10-CM | POA: Diagnosis not present

## 2018-06-20 DIAGNOSIS — Z950 Presence of cardiac pacemaker: Secondary | ICD-10-CM | POA: Diagnosis not present

## 2018-06-20 MED ORDER — METOLAZONE 2.5 MG PO TABS: 2.5000 mg | ORAL_TABLET | Freq: Every day | ORAL | 3 refills | Status: DC

## 2018-06-20 NOTE — Progress Notes (Signed)
Cardiology Office Note   Date:  06/20/2018   ID:  Mary [name], DOB 1941-02-19, MRN [id number]  PCP:  Hoyt Koch, MD  Cardiologist:   DR. [name] EP Dr. Rayann Heman    Chief Complaint  Patient presents with  . Hospitalization Follow-up      History of Present Illness: Mary [name] is a 77 y.o. female who presents for post hospitalization for heart failure.   She has a hx of chronic diastolic HF, non obstructive diffuse CAD treated medicaly. Tachybrady syndrome with St Jude PPM, carotid artery disease, on flecainide, and  paroxysmal atrial fib (per EP notes), colon CA s/p chemo, prior DVT, OSA, glaucoma, HTN, HLD, hypothyroidism, IDA, multinodular goiter, sleep apnea, morbid obesity s/p weight loss surgery, DM, CKD III   She was admitted 05/24/18 for heart failure.   Cardiac cath 05/29/18 with non obstructive CAD.  EF was 50% pt was diuresed to 268 lbs.  She was AV paced in hospital.  Did have anemia at Hgb 9.2  COPD exacerbation /Bronchitis. Flecainide was stopped.   Saw Dr. Rayann Heman 06/08/18  SR with V pacing.  Dr. Rayann Heman noted " Could consider restarting flecainide vs tikosyn or amioadrone.  For now, will follow conservatively.  I think when you look at her overall picture and risks/ benefits associated with options, she would be best to return to flecainide as she did very well with this medicine and has not had obstructive CAD"  ASA was stopped, she is on Eliquis  Over last few days has had increase of her SOB, cannot lie flat at night to sleep. No fever, + productive cough with white mucus. +edema. + wt gain.  Her best dry wt is 261 lbs.  At discharge she was 268 lbs.  Today 266 lbs but this is up from wt of 259 on 06/07/18.  No chest pain.  Her PCP had increased lasix to 80 mg BID.  Very SOB with exertion.  She does not want to be re-admitted if possible.    Past Medical History:  Diagnosis Date  . Arthritis   . Atrial tachycardia ([place]) 04/23/2017  . CAD (coronary  artery disease)    non obstructive  . 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 Procedure complicated by an atrial lead dislodgment and repeat procedure, 12/2013   . Carotid stenosis    Carotid US 5/16:  Bilateral ICA 1-39%; L vertebral retrograde; L BP 126/49, R BP 140/57  . Chronic combined systolic and diastolic CHF (congestive heart failure) ([place])   . Chronic lower back pain   . Colon cancer ([place])    a. s/p chemo  . Colon polyps   . Diabetic peripheral neuropathy ([place])   . DVT (deep venous thrombosis) ([place]) 2013   "twice behind knee on left side" (06/25/2013)  . Glaucoma   . Hyperlipidemia   . Hypertension   . Hypothyroidism   . Iron deficiency anemia   . Multinodular goiter   . Osteoporosis   . PAF (paroxysmal atrial fibrillation) ([place])   . Sleep apnea    a. resolved post weight loss   . Type II diabetes mellitus ([place])     Past Surgical History:  Procedure Laterality Date  . CARDIAC CATHETERIZATION  2006   Never had PCI, 3 caths total. Last one in [place]  . CARDIAC CATHETERIZATION N/A 02/04/2015   Procedure: Left Heart Cath and Coronary Angiography;  Surgeon: Belva Crome, MD;  Location: [facility];  Service: Cardiovascular;  Laterality: N/A;  . CATARACT EXTRACTION, BILATERAL Bilateral 2013   "and put stent in my left eye for glaucoma" (06/25/2013)  . CHOLECYSTECTOMY  1980's  . COLECTOMY  10/2010   Tumor removal  . EYE MUSCLE SURGERY Bilateral ~ 1963   "muscles too long; eyes would go out and up; tied muscles to hold my eyes straight" (06/25/2013)  . LEAD REVISION  01-03-2014   atrial lead revision by Dr Rayann Heman  . LEAD REVISION N/A 01/03/2014   Procedure: LEAD REVISION;  Surgeon: Coralyn Mark, MD;  Location: [facility];  Service: Cardiovascular;  Laterality: N/A;  . LEFT HEART CATH AND CORONARY ANGIOGRAPHY N/A 05/29/2018   Procedure: LEFT HEART CATH AND CORONARY ANGIOGRAPHY;  Surgeon: Belva Crome, MD;   Location: [facility];  Service: Cardiovascular;  Laterality: N/A;  . PACEMAKER INSERTION  01/02/2014   STJ Assurity dual chamber pacemaker implnated by Dr Lovena Le for SSS  . PERMANENT PACEMAKER INSERTION N/A 01/02/2014   Procedure: PERMANENT PACEMAKER INSERTION;  Surgeon: Evans Lance, MD;  Location: [facility] C[facility] LAB;  Service: Cardiovascular;  Laterality: N/A;  . PORTACATH PLACEMENT Left 11/2010  . ROUX-EN-Y GASTRIC BYPASS  2009  . ULNAR TUNNEL RELEASE Left ~ 2008  . UMBILICAL HERNIA REPAIR  1970's?    (06/25/2013)     Current Outpatient Medications  Medication Sig Dispense Refill  . albuterol (PROVENTIL HFA;VENTOLIN HFA) 108 (90 Base) MCG/ACT inhaler Inhale 2 puffs into the lungs every 6 (six) hours as needed for wheezing or shortness of breath. 1 Inhaler 2  . apixaban (ELIQUIS) 5 MG TABS tablet Take 5 mg by mouth 2 (two) times daily.    Marland Kitchen atorvastatin (LIPITOR) 80 MG tablet Take 80 mg by mouth daily at 6 PM.    . bimatoprost (LUMIGAN) 0.01 % SOLN Place 1 drop into the right eye at bedtime.     Marland Kitchen buPROPion (WELLBUTRIN XL) 150 MG 24 hr tablet Take 1 tablet (150 mg total) by mouth daily. 90 tablet 1  . furosemide (LASIX) 80 MG tablet Take 1 tablet (80 mg total) by mouth 2 (two) times daily. 180 tablet 1  . gabapentin (NEURONTIN) 300 MG capsule Take 300 mg by mouth at bedtime.     Marland Kitchen HYDROcodone-acetaminophen (NORCO/VICODIN) 5-325 MG tablet Take 1 tablet by mouth every 4 (four) hours as needed for moderate pain. 30 tablet 0  . Insulin Glargine (BASAGLAR KWIKPEN) 100 UNIT/ML SOPN Inject 0.2 mLs (20 Units total) into the skin daily. 10 pen 0  . levothyroxine (SYNTHROID, LEVOTHROID) 125 MCG tablet Take 125 mcg by mouth daily before breakfast.  5  . metoprolol succinate (TOPROL XL) 25 MG 24 hr tablet Take 0.5 tablets (12.5 mg total) by mouth at bedtime. 90 tablet 3  . nitroGLYCERIN (NITROSTAT) 0.4 MG SL tablet Place 1 tablet (0.4 mg total) under the tongue every 5 (five) minutes as needed for  chest pain. 75 tablet 2  . polyethylene glycol (MIRALAX / GLYCOLAX) packet Take 17 g by mouth 2 (two) times daily. 14 each 0  . potassium chloride SA (K-DUR,KLOR-CON) 20 MEQ tablet Take 40 mEq by mouth daily.     No current facility-administered medications for this visit.     Allergies:   Other; Adhesive [tape]; Aspirin; Latex; and Nsaids    Social History:  The patient  reports that she quit smoking about 15 months ago. Her smoking use included cigarettes. She has a 30.00 pack-year smoking  history. She has never used smokeless tobacco. She reports that she drank alcohol. She reports that she does not use drugs.   Family History:  The patient's family history includes Bladder Cancer in her brother and cousin; Breast cancer in her maternal aunt, mother, and sister; Clotting disorder in her sister; Colon polyps in her brother and brother; Diabetes in her maternal grandmother, mother, and sister; Heart attack in her brother, father, and sister; Heart disease in her father; Pancreatic cancer in her brother; Prostate cancer in her brother.    ROS:  General:no colds or fevers, no weight changes Skin:no rashes or ulcers HEENT:no blurred vision, no congestion CV:see HPI PUL:see HPI GI:no diarrhea constipation or melena, no indigestion GU:no hematuria, no dysuria MS:no joint pain, no claudication Neuro:no syncope, no lightheadedness Endo:no diabetes, + thyroid disease  Wt Readings from Last 3 Encounters:  06/20/18 266 lb 12.8 oz (121 kg)  06/07/18 259 lb 9.6 oz (117.8 kg)  06/06/18 263 lb (119.3 kg)     PHYSICAL EXAM: VS:  BP 120/68   Pulse (!) 103   Ht 5\' 7"  (1.702 m)   Wt 266 lb 12.8 oz (121 kg)   SpO2 94%   BMI 41.79 kg/m  , BMI Body mass index is 41.79 kg/m. General:Pleasant affect, NAD Skin:Warm and dry, brisk capillary refill HEENT:normocephalic, sclera clear, mucus membranes moist Neck:supple, + JVD, no bruits  Heart:S1S2 RRR without murmur, gallup, rub or  click Lungs:with rales, rhonchi, and wheezes ASN:KNLZ, non tender, + BS, do not palpate liver spleen or masses Ext: 2-3+lower ext edema, 2+ pedal pulses, 2+ radial pulses SHE IS WEARING SUPPORT HOSE.  Neuro:alert and oriented X 3, MAE, follows commands, + facial symmetry    EKG:  EKG is ordered today. The ekg ordered today demonstrates a sensing and V pacing, Dr. Curt Bears reviewed with me.     Recent Labs: 03/21/2018: TSH 2.22 05/23/2018: B Natriuretic Peptide 196.3 06/06/2018: ALT 11; BUN 17; Creatinine, Ser 1.33; Hemoglobin 10.7; Platelets 408.0; Potassium 3.4; Sodium 138    Lipid Panel    Component Value Date/Time   CHOL 124 05/25/2018 0531   TRIG 81 05/25/2018 0531   HDL 35 (L) 05/25/2018 0531   CHOLHDL 3.5 05/25/2018 0531   VLDL 16 05/25/2018 0531   LDLCALC 73 05/25/2018 0531   LDLDIRECT 79.0 03/23/2016 1529       Other studies Reviewed: Additional studies/ records that were reviewed today include: . Echo 05/26/18 Study Conclusions  - Left ventricle: Systolic function was normal. The estimated   ejection fraction was in the range of 55% to 60%. There is   hypokinesis of the apical septal myocardium.  Impressions:  - Limited study with definity to evaluate wall motion; akinesis of   the distal septum with overall preserved LV function. Cardiac Cath 05/29/18  When compared to prior coronary angiography, no significant change has occurred.  Proximal/ostial 25% left main.  Calcified proximal to mid LAD with 20 to 30% narrowing.  Distal to apical LAD diffusely involved with up to 50 to 70% narrowing.  Apical LAD actually appears improved compared to prior.  Circumflex is patent with irregularities noted in the circumflex and obtuse marginal branches up to 40 to 50%.  Diffuse luminal irregularities throughout the right coronary up to 40%.  Right coronary is dominant.  Dyssynergy of LV contractility pattern likely related to RV pacing.  There may be focal apical  akinesis.  EF is 50%.  LVEDP is normal.  RECOMMENDATIONS:   Primary risk  prevention with aggressive lipid-lowering, hemoglobin A1c less than 7, weight loss, evaluation and treatment of probable sleep apnea, blood pressure target less than or equal to 130/80 mmHg, and moderate intensity aerobic activity greater than 150 minutes/week.   ASSESSMENT AND PLAN:  1.  Acute on chronic diastolic HF with wt gain and edema along with SOB, has to sit up at night. --will check CXR, with recent hospitalization to rule out PNA check CBC with her anemia as cause, BMP and pro bnp.  Follow up on Friday this week, though if symptoms increase she should go to ER.  Will add metolazone 2.5 mg daily, first dose today for diuresis.   2.  PAF, currently SR with a sensing and V pacing.  If recurrent a fib, possible resume flecainide vs amiodarone per Dr. Rayann Heman  Anticoagulation on Eliquis   3.  Non obstructive CAD on recent cardiac cath.  4.  Has ST Jude PPM    5.  COPD recent exacerbation and bronchitis.    Current medicines are reviewed with the patient today.  The patient Has no concerns regarding medicines.  The following changes have been made:  See above Labs/ tests ordered today include:see above  Disposition:   FU:  see above  Signed, Cecilie Kicks, NP  06/20/2018 [facility] [place], [place], [place], [place] Phone: [phone]; Fax: [phone]

## 2018-06-20 NOTE — Patient Instructions (Signed)
Medication Instructions:  START: Metolazone (Zaroxolyn) 2.5 MG daily before your morning dose of lasix  If you need a refill on your cardiac medications before your next appointment, please call your pharmacy.   Lab work: TODAY: BMP, BNP & CBC  If you have labs (blood work) drawn today and your tests are completely normal, you will receive your results only by: Marland Kitchen MyChart Message (if you have MyChart) OR . A paper copy in the mail If you have any lab test that is abnormal or we need to change your treatment, we will call you to review the results.  Testing/Procedures: A chest x-ray takes a picture of the organs and structures inside the chest, including the heart, lungs, and blood vessels. This test can show several things, including, whether the heart is enlarges; whether fluid is building up in the lungs; and whether pacemaker / defibrillator leads are still in place.   Follow-Up: Your are scheduled to see Cecilie Kicks NP, on 06/23/18 @ 2:00 PM   Any Other Special Instructions Will Be Listed Below (If Applicable).

## 2018-06-21 LAB — BASIC METABOLIC PANEL
BUN/Creatinine Ratio: 17 (ref 12–28)
BUN: 21 mg/dL (ref 8–27)
CALCIUM: 9.4 mg/dL (ref 8.7–10.3)
CO2: 26 mmol/L (ref 20–29)
CREATININE: 1.22 mg/dL — AB (ref 0.57–1.00)
Chloride: 97 mmol/L (ref 96–106)
GFR calc Af Amer: 50 mL/min/{1.73_m2} — ABNORMAL LOW (ref >59)
GFR, EST NON AFRICAN AMERICAN: 43 mL/min/{1.73_m2} — AB (ref >59)
Glucose: 215 mg/dL — ABNORMAL HIGH (ref 65–99)
POTASSIUM: 4.6 mmol/L (ref 3.5–5.2)
Sodium: 142 mmol/L (ref 134–144)

## 2018-06-21 LAB — CBC
HEMOGLOBIN: 10.9 g/dL — AB (ref 11.1–15.9)
Hematocrit: 34.3 % (ref 34.0–46.6)
MCH: 26.2 pg — AB (ref 26.6–33.0)
MCHC: 31.8 g/dL (ref 31.5–35.7)
MCV: 83 fL (ref 79–97)
Platelets: 409 10*3/uL (ref 150–450)
RBC: 4.16 x10E6/uL (ref 3.77–5.28)
RDW: 18.5 % — ABNORMAL HIGH (ref 12.3–15.4)
WBC: 9.7 10*3/uL (ref 3.4–10.8)

## 2018-06-21 LAB — PRO B NATRIURETIC PEPTIDE: NT-PRO BNP: 836 pg/mL — AB (ref 0–738)

## 2018-06-23 ENCOUNTER — Encounter: Payer: Self-pay | Admitting: Cardiology

## 2018-06-23 ENCOUNTER — Ambulatory Visit (INDEPENDENT_AMBULATORY_CARE_PROVIDER_SITE_OTHER): Payer: Medicare Other | Admitting: Cardiology

## 2018-06-23 VITALS — BP 141/73 | HR 74 | Ht 67.0 in | Wt 260.0 lb

## 2018-06-23 DIAGNOSIS — Z79899 Other long term (current) drug therapy: Secondary | ICD-10-CM

## 2018-06-23 DIAGNOSIS — I10 Essential (primary) hypertension: Secondary | ICD-10-CM | POA: Diagnosis not present

## 2018-06-23 DIAGNOSIS — I480 Paroxysmal atrial fibrillation: Secondary | ICD-10-CM | POA: Diagnosis not present

## 2018-06-23 DIAGNOSIS — Z720 Tobacco use: Secondary | ICD-10-CM

## 2018-06-23 DIAGNOSIS — I25118 Atherosclerotic heart disease of native coronary artery with other forms of angina pectoris: Secondary | ICD-10-CM | POA: Diagnosis not present

## 2018-06-23 DIAGNOSIS — I5033 Acute on chronic diastolic (congestive) heart failure: Secondary | ICD-10-CM | POA: Diagnosis not present

## 2018-06-23 DIAGNOSIS — Z950 Presence of cardiac pacemaker: Secondary | ICD-10-CM | POA: Diagnosis not present

## 2018-06-23 DIAGNOSIS — J449 Chronic obstructive pulmonary disease, unspecified: Secondary | ICD-10-CM | POA: Diagnosis not present

## 2018-06-23 MED ORDER — METOLAZONE 2.5 MG PO TABS: 2.5000 mg | ORAL_TABLET | ORAL | 3 refills | Status: DC

## 2018-06-23 NOTE — Progress Notes (Signed)
Cardiology Office Note   Date:  06/23/2018   ID:  DAVI ROTAN, DOB 1941-07-26, MRN [id number]  PCP:  Hoyt Koch, MD  Cardiologist:  Dr. [name]     Chief Complaint  Patient presents with  . Congestive Heart Failure      History of Present Illness: KEIMORA SWARTOUT is a 77 y.o. female who presents for acute HF. Was started on metolazone on last visit.    She has a hx of chronic diastolic HF, non obstructive diffuse CAD treated medicaly. Tachybrady syndrome with St Jude PPM, carotid artery disease, on flecainide, and  paroxysmal atrial fib (per EP notes), colon CA s/p chemo, prior DVT, OSA, glaucoma, HTN, HLD, hypothyroidism, IDA, multinodular goiter, sleep apnea, morbid obesity s/p weight loss surgery, DM, CKD III  She was admitted 05/24/18 for heart failure.   Cardiac cath 05/29/18 with non obstructive CAD.  EF was 50% pt was diuresed to 268 lbs.  She was AV paced in hospital.  Did have anemia at Hgb 9.2  COPD exacerbation /Bronchitis. Flecainide was stopped.   Saw Dr. Rayann Heman 06/08/18  SR with V pacing.  Dr. Rayann Heman noted " Could consider restarting flecainide vs tikosyn or amioadrone. For now, will follow conservatively. I think when you look at her overall picture and risks/ benefits associated with options, she would be best to return to flecainide as she did very well with this medicine and has not had obstructive CAD"  ASA was stopped, she is on Eliquis  Over last few days has had increase of her SOB, cannot lie flat at night to sleep. No fever, + productive cough with white mucus. +edema. + wt gain.  Her best dry wt is 261 lbs.  At discharge she was 268 lbs.  Today 266 lbs but this is up from wt of 259 on 06/07/18.  No chest pain.  Her PCP had increased lasix to 80 mg BID.  Very SOB with exertion.  She did not want to be re-admitted if possible.    Metolazone added to meds on visit 06/20/18 , labs revealed CHF and CXR without PNA  She was in SR on last visit.   anticoagulation on Eliquis .  Today she is back and is improved her wt is down 6 lbs and she is breathing much easier.  No chest pain.  She also mentions she has stopped smoking again.  She had resumed but stopped yesterday.     Past Medical History:  Diagnosis Date  . Arthritis   . Atrial tachycardia ([place]) 04/23/2017  . CAD (coronary artery disease)    non obstructive  . 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 Procedure complicated by an atrial lead dislodgment and repeat procedure, 12/2013   . Carotid stenosis    Carotid US 5/16:  Bilateral ICA 1-39%; L vertebral retrograde; L BP 126/49, R BP 140/57  . Chronic combined systolic and diastolic CHF (congestive heart failure) ([place])   . Chronic lower back pain   . Colon cancer ([place])    a. s/p chemo  . Colon polyps   . Diabetic peripheral neuropathy ([place])   . DVT (deep venous thrombosis) ([place]) 2013   "twice behind knee on left side" (06/25/2013)  . Glaucoma   . Hyperlipidemia   . Hypertension   . Hypothyroidism   . Iron deficiency anemia   . Multinodular goiter   . Osteoporosis   . PAF (paroxysmal atrial fibrillation) (  [place])   . Sleep apnea    a. resolved post weight loss   . Type II diabetes mellitus ([place])     Past Surgical History:  Procedure Laterality Date  . CARDIAC CATHETERIZATION  2006   Never had PCI, 3 caths total. Last one in [place]  . CARDIAC CATHETERIZATION N/A 02/04/2015   Procedure: Left Heart Cath and Coronary Angiography;  Surgeon: Belva Crome, MD;  Location: [facility];  Service: Cardiovascular;  Laterality: N/A;  . CATARACT EXTRACTION, BILATERAL Bilateral 2013   "and put stent in my left eye for glaucoma" (06/25/2013)  . CHOLECYSTECTOMY  1980's  . COLECTOMY  10/2010   Tumor removal  . EYE MUSCLE SURGERY Bilateral ~ 1963   "muscles too long; eyes would go out and up; tied muscles to hold my eyes straight" (06/25/2013)  . LEAD REVISION   01-03-2014   atrial lead revision by Dr Rayann Heman  . LEAD REVISION N/A 01/03/2014   Procedure: LEAD REVISION;  Surgeon: Coralyn Mark, MD;  Location: [facility];  Service: Cardiovascular;  Laterality: N/A;  . LEFT HEART CATH AND CORONARY ANGIOGRAPHY N/A 05/29/2018   Procedure: LEFT HEART CATH AND CORONARY ANGIOGRAPHY;  Surgeon: Belva Crome, MD;  Location: [facility];  Service: Cardiovascular;  Laterality: N/A;  . PACEMAKER INSERTION  01/02/2014   STJ Assurity dual chamber pacemaker implnated by Dr Lovena Le for SSS  . PERMANENT PACEMAKER INSERTION N/A 01/02/2014   Procedure: PERMANENT PACEMAKER INSERTION;  Surgeon: Evans Lance, MD;  Location: [facility] C[facility];  Service: Cardiovascular;  Laterality: N/A;  . PORTACATH PLACEMENT Left 11/2010  . ROUX-EN-Y GASTRIC BYPASS  2009  . ULNAR TUNNEL RELEASE Left ~ 2008  . UMBILICAL HERNIA REPAIR  1970's?    (06/25/2013)     Current Outpatient Medications  Medication Sig Dispense Refill  . albuterol (PROVENTIL HFA;VENTOLIN HFA) 108 (90 Base) MCG/ACT inhaler Inhale 2 puffs into the lungs every 6 (six) hours as needed for wheezing or shortness of breath. 1 Inhaler 2  . apixaban (ELIQUIS) 5 MG TABS tablet Take 5 mg by mouth 2 (two) times daily.    Marland Kitchen atorvastatin (LIPITOR) 80 MG tablet Take 80 mg by mouth daily at 6 PM.    . bimatoprost (LUMIGAN) 0.01 % SOLN Place 1 drop into the right eye at bedtime.     Marland Kitchen buPROPion (WELLBUTRIN XL) 150 MG 24 hr tablet Take 1 tablet (150 mg total) by mouth daily. 90 tablet 1  . furosemide (LASIX) 80 MG tablet Take 1 tablet (80 mg total) by mouth 2 (two) times daily. 180 tablet 1  . gabapentin (NEURONTIN) 300 MG capsule Take 300 mg by mouth at bedtime.     Marland Kitchen HYDROcodone-acetaminophen (NORCO/VICODIN) 5-325 MG tablet Take 1 tablet by mouth every 4 (four) hours as needed for moderate pain. 30 tablet 0  . Insulin Glargine (BASAGLAR KWIKPEN) 100 UNIT/ML SOPN Inject 0.2 mLs (20 Units total) into the skin daily. 10 pen 0  .  levothyroxine (SYNTHROID, LEVOTHROID) 125 MCG tablet Take 125 mcg by mouth daily before breakfast.  5  . metolazone (ZAROXOLYN) 2.5 MG tablet Take 1 tablet (2.5 mg total) by mouth daily. Take 1 pill before your morning dose of lasix 90 tablet 3  . metoprolol succinate (TOPROL XL) 25 MG 24 hr tablet Take 0.5 tablets (12.5 mg total) by mouth at bedtime. 90 tablet 3  . nitroGLYCERIN (NITROSTAT) 0.4 MG SL tablet Place 1 tablet (0.4 mg total) under the tongue  every 5 (five) minutes as needed for chest pain. 75 tablet 2  . polyethylene glycol (MIRALAX / GLYCOLAX) packet Take 17 g by mouth 2 (two) times daily. 14 each 0  . potassium chloride SA (K-DUR,KLOR-CON) 20 MEQ tablet Take 40 mEq by mouth daily.     No current facility-administered medications for this visit.     Allergies:   Other; Adhesive [tape]; Aspirin; Latex; and Nsaids    Social History:  The patient  reports that she quit smoking about 15 months ago. Her smoking use included cigarettes. She has a 30.00 pack-year smoking history. She has never used smokeless tobacco. She reports that she drank alcohol. She reports that she does not use drugs.   Family History:  The patient's family history includes Bladder Cancer in her brother and cousin; Breast cancer in her maternal aunt, mother, and sister; Clotting disorder in her sister; Colon polyps in her brother and brother; Diabetes in her maternal grandmother, mother, and sister; Heart attack in her brother, father, and sister; Heart disease in her father; Pancreatic cancer in her brother; Prostate cancer in her brother.    ROS:  General:no colds or fevers, + weight loss Skin:no rashes or ulcers HEENT:no blurred vision, no congestion CV:see HPI PUL:see HPI GI:no diarrhea constipation or melena, no indigestion GU:no hematuria, no dysuria MS:no joint pain, no claudication Neuro:no syncope, no lightheadedness Endo:+ diabetes- one episode of hypoglycemia, + thyroid disease  Wt Readings from  Last 3 Encounters:  06/23/18 260 lb (117.9 kg)  06/20/18 266 lb 12.8 oz (121 kg)  06/07/18 259 lb 9.6 oz (117.8 kg)     PHYSICAL EXAM: VS:  BP (!) 141/73   Pulse 74   Ht 5\' 7"  (1.702 m)   Wt 260 lb (117.9 kg)   BMI 40.72 kg/m  , BMI Body mass index is 40.72 kg/m. General:Pleasant affect, NAD Skin:Warm and dry, brisk capillary refill HEENT:normocephalic, sclera clear, mucus membranes moist Neck:supple, no JVD, no bruits  Heart:S1S2 RRR without murmur, gallup, rub or click Lungs:improved with less rales, rhonchi, and wheezes but improved.  KPT:WSFK, non tender, + BS, do not palpate liver spleen or masses Ext:no lower ext edema, 2+ pedal pulses, 2+ radial pulses Neuro:alert and oriented X 3, MAE, follows commands, + facial symmetry    EKG:  EKG is NOT ordered today.   Recent Labs: 03/21/2018: TSH 2.22 05/23/2018: B Natriuretic Peptide 196.3 06/06/2018: ALT 11 06/20/2018: BUN 21; Creatinine, Ser 1.22; Hemoglobin 10.9; NT-Pro BNP 836; Platelets 409; Potassium 4.6; Sodium 142    Lipid Panel    Component Value Date/Time   CHOL 124 05/25/2018 0531   TRIG 81 05/25/2018 0531   HDL 35 (L) 05/25/2018 0531   CHOLHDL 3.5 05/25/2018 0531   VLDL 16 05/25/2018 0531   LDLCALC 73 05/25/2018 0531   LDLDIRECT 79.0 03/23/2016 1529       Other studies Reviewed: Additional studies/ records that were reviewed today include: . Echo 05/26/18 Study Conclusions  - Left ventricle: Systolic function was normal. The estimated ejection fraction was in the range of 55% to 60%. There is hypokinesis of the apical septal myocardium.  Impressions:  - Limited study with definity to evaluate wall motion; akinesis of the distal septum with overall preserved LV function. Cardiac Cath 05/29/18  When compared to prior coronary angiography, no significant change has occurred.  Proximal/ostial 25% left main.  Calcified proximal to mid LAD with 20 to 30% narrowing. Distal to apical LAD  diffusely involved with up to 50  to 70% narrowing. Apical LAD actually appears improved compared to prior.  Circumflex is patent with irregularities noted in the circumflex and obtuse marginal branches up to 40 to 50%.  Diffuse luminal irregularities throughout the right coronary up to 40%. Right coronary is dominant.  Dyssynergy of LV contractility pattern likely related to RV pacing. There may be focal apical akinesis. EF is 50%. LVEDP is normal.  RECOMMENDATIONS:   Primary risk prevention with aggressive lipid-lowering, hemoglobin A1c less than 7, weight loss, evaluation and treatment of probable sleep apnea, blood pressure target less than or equal to 130/80 mmHg, and moderate intensity aerobic activity greater than 150 minutes/week.  cardiac cath 05/29/18  When compared to prior coronary angiography, no significant change has occurred.  Proximal/ostial 25% left main.  Calcified proximal to mid LAD with 20 to 30% narrowing.  Distal to apical LAD diffusely involved with up to 50 to 70% narrowing.  Apical LAD actually appears improved compared to prior.  Circumflex is patent with irregularities noted in the circumflex and obtuse marginal branches up to 40 to 50%.  Diffuse luminal irregularities throughout the right coronary up to 40%.  Right coronary is dominant.  Dyssynergy of LV contractility pattern likely related to RV pacing.  There may be focal apical akinesis.  EF is 50%.  LVEDP is normal.  RECOMMENDATIONS:   Primary risk prevention with aggressive lipid-lowering, hemoglobin A1c less than 7, weight loss, evaluation and treatment of probable sleep apnea, blood pressure target less than or equal to 130/80 mmHg, and moderate intensity aerobic activity greater than 150 minutes/week.   No indication for antiplatelet therapy at this time  ASSESSMENT AND PLAN:  1.  Acute on chronic diastolic HF on the 86LJ, now with addition of metolazone wt is down 6 lbs and edema  improved along with respirations.   Will check BMP today - she will be on 2000 mg Na diet and 2L fluid restriction.  I am decreasing the metolazone to 2.5 mg every other day until Wed. Then she will begin on Tuesday and Sat each week.  She will follow up in 2 weeks with Dr. [name] or myself.    2.  PAF maintaining SR with AV pacing, today by exam, on 06/20/18 by EKG.  If recurrent a fib , possible resume flecainide per Dr. Rayann Heman vs. Amiodarone.  She is on anticoagulation with Eliquis. No bleeding.    3.  Non obstructive CAD on cath last month  4.  Has ST jude ppm  5.  COPD with exacerbation and bronchitis.  - she has an inhaler, but she did use her husbands nebulizer.   6.  Tobacco use, has stopped again. Yesterday.      Current medicines are reviewed with the patient today.  The patient Has no concerns regarding medicines.  The following changes have been made:  See above Labs/ tests ordered today include:see above  Disposition:   FU:  see above  Signed, Cecilie Kicks, NP  06/23/2018 2:25 PM    [facility] [place], [place] [place], [place] Phone: [phone]; Fax: [phone]

## 2018-06-23 NOTE — Patient Instructions (Addendum)
Medication Instructions:  Your physician has recommended you make the following change in your medication:  1.  REDUCE the Metolazone to [place], Saturday, 06/24/18.  AFTER Tuesday, 06/27/18, ONLY TAKE IT ON TUESDAYS AND SATURDAYS   If you need a refill on your cardiac medications before your next appointment, please call your pharmacy.   Lab work: TODAY:  BMET  If you have labs (blood work) drawn today and your tests are completely normal, you will receive your results only by: Marland Kitchen MyChart Message (if you have MyChart) OR . A paper copy in the mail If you have any lab test that is abnormal or we need to change your treatment, we will call you to review the results.  Testing/Procedures: None ordered  Follow-Up: Your physician recommends that you schedule a follow-up appointment in: 07/04/18 ARRIVE AT 1:45 TO SEE Cecilie Kicks, NP  Any Other Special Instructions Will Be Listed Below (If Applicable).

## 2018-06-24 LAB — BASIC METABOLIC PANEL
BUN/Creatinine Ratio: 18 (ref 12–28)
BUN: 28 mg/dL — AB (ref 8–27)
CALCIUM: 9.5 mg/dL (ref 8.7–10.3)
CO2: 29 mmol/L (ref 20–29)
Chloride: 88 mmol/L — ABNORMAL LOW (ref 96–106)
Creatinine, Ser: 1.58 mg/dL — ABNORMAL HIGH (ref 0.57–1.00)
GFR, EST AFRICAN AMERICAN: 36 mL/min/{1.73_m2} — AB (ref >59)
GFR, EST NON AFRICAN AMERICAN: 32 mL/min/{1.73_m2} — AB (ref >59)
Glucose: 132 mg/dL — ABNORMAL HIGH (ref 65–99)
Potassium: 3.2 mmol/L — ABNORMAL LOW (ref 3.5–5.2)
Sodium: 136 mmol/L (ref 134–144)

## 2018-06-26 ENCOUNTER — Telehealth: Payer: Self-pay | Admitting: *Deleted

## 2018-06-26 DIAGNOSIS — G4733 Obstructive sleep apnea (adult) (pediatric): Secondary | ICD-10-CM | POA: Diagnosis not present

## 2018-06-26 DIAGNOSIS — Z79899 Other long term (current) drug therapy: Secondary | ICD-10-CM

## 2018-06-26 NOTE — Telephone Encounter (Signed)
-----   Message from Isaiah Serge, NP sent at 06/25/2018  8:52 PM EDT ----- Ask pt to take 80 meq Kdur Monday, and BMP on Tuesday thanks

## 2018-06-26 NOTE — Telephone Encounter (Signed)
Spoke with pt re: lab results. She will increase K+ to 80 meq today only then go back to her normal 40 meq daily and repeat BMET tomorrow, 06/27/18. Pt verbalized understanding and thanked me for the call.

## 2018-06-27 ENCOUNTER — Other Ambulatory Visit: Payer: Self-pay | Admitting: *Deleted

## 2018-06-27 ENCOUNTER — Other Ambulatory Visit: Payer: Medicare Other

## 2018-06-27 DIAGNOSIS — Z79899 Other long term (current) drug therapy: Secondary | ICD-10-CM

## 2018-06-27 DIAGNOSIS — G4733 Obstructive sleep apnea (adult) (pediatric): Secondary | ICD-10-CM

## 2018-06-27 LAB — BASIC METABOLIC PANEL
BUN / CREAT RATIO: 29 — AB (ref 12–28)
BUN: 49 mg/dL — ABNORMAL HIGH (ref 8–27)
CHLORIDE: 84 mmol/L — AB (ref 96–106)
CO2: 30 mmol/L — ABNORMAL HIGH (ref 20–29)
CREATININE: 1.68 mg/dL — AB (ref 0.57–1.00)
Calcium: 9.4 mg/dL (ref 8.7–10.3)
GFR, EST AFRICAN AMERICAN: 34 mL/min/{1.73_m2} — AB (ref >59)
GFR, EST NON AFRICAN AMERICAN: 29 mL/min/{1.73_m2} — AB (ref >59)
Glucose: 169 mg/dL — ABNORMAL HIGH (ref 65–99)
Potassium: 3.3 mmol/L — ABNORMAL LOW (ref 3.5–5.2)
Sodium: 135 mmol/L (ref 134–144)

## 2018-06-28 ENCOUNTER — Telehealth: Payer: Self-pay | Admitting: *Deleted

## 2018-06-28 DIAGNOSIS — E876 Hypokalemia: Secondary | ICD-10-CM

## 2018-06-28 NOTE — Telephone Encounter (Signed)
-----   Message from Isaiah Serge, NP sent at 06/28/2018  9:14 AM EDT ----- Stop metolazone, continue lasix, Kdur take 40 meq  BID today and then daily,  Recheck BMP on Friday stat

## 2018-06-30 ENCOUNTER — Other Ambulatory Visit: Payer: Medicare Other

## 2018-06-30 ENCOUNTER — Telehealth: Payer: Self-pay

## 2018-06-30 DIAGNOSIS — E876 Hypokalemia: Secondary | ICD-10-CM

## 2018-06-30 LAB — BASIC METABOLIC PANEL
BUN/Creatinine Ratio: 32 — ABNORMAL HIGH (ref 12–28)
BUN: 52 mg/dL — ABNORMAL HIGH (ref 8–27)
CO2: 34 mmol/L — AB (ref 20–29)
CREATININE: 1.61 mg/dL — AB (ref 0.57–1.00)
Calcium: 9.1 mg/dL (ref 8.7–10.3)
Chloride: 86 mmol/L — ABNORMAL LOW (ref 96–106)
GFR, EST AFRICAN AMERICAN: 36 mL/min/{1.73_m2} — AB (ref >59)
GFR, EST NON AFRICAN AMERICAN: 31 mL/min/{1.73_m2} — AB (ref >59)
Glucose: 245 mg/dL — ABNORMAL HIGH (ref 65–99)
Potassium: 3.1 mmol/L — ABNORMAL LOW (ref 3.5–5.2)
Sodium: 130 mmol/L — ABNORMAL LOW (ref 134–144)

## 2018-06-30 NOTE — Telephone Encounter (Signed)
-----   Message from Isaiah Serge, NP sent at 06/30/2018  4:23 PM EDT ----- She needs Kdur 40 meq twice a day today and tomorrow. Then daily. No more metolazone.  Her glucose is running high.  Watch diet.   BMP on Monday.  Hope to be back to normal then.

## 2018-06-30 NOTE — Telephone Encounter (Signed)
Patient aware of lab results. Per Cecilie Kicks NP, She needs Kdur 40 meq twice a day today and tomorrow. Then daily. No more metolazone.  Her glucose is running high.  Watch diet.   BMP on Monday.  Hope to be back to normal then. Patient verbalized understanding. Patient will come in on Monday for BMET.

## 2018-07-03 ENCOUNTER — Other Ambulatory Visit: Payer: Medicare Other | Admitting: *Deleted

## 2018-07-03 DIAGNOSIS — E876 Hypokalemia: Secondary | ICD-10-CM

## 2018-07-04 DIAGNOSIS — G4733 Obstructive sleep apnea (adult) (pediatric): Secondary | ICD-10-CM | POA: Diagnosis not present

## 2018-07-04 LAB — BASIC METABOLIC PANEL WITH GFR
BUN/Creatinine Ratio: 25 (ref 12–28)
BUN: 33 mg/dL — ABNORMAL HIGH (ref 8–27)
CO2: 30 mmol/L — ABNORMAL HIGH (ref 20–29)
Calcium: 9.4 mg/dL (ref 8.7–10.3)
Chloride: 89 mmol/L — ABNORMAL LOW (ref 96–106)
Creatinine, Ser: 1.34 mg/dL — ABNORMAL HIGH (ref 0.57–1.00)
GFR calc Af Amer: 44 mL/min/1.73 — ABNORMAL LOW
GFR calc non Af Amer: 39 mL/min/1.73 — ABNORMAL LOW
Glucose: 150 mg/dL — ABNORMAL HIGH (ref 65–99)
Potassium: 3.6 mmol/L (ref 3.5–5.2)
Sodium: 139 mmol/L (ref 134–144)

## 2018-07-06 ENCOUNTER — Encounter: Payer: Self-pay | Admitting: Internal Medicine

## 2018-07-06 ENCOUNTER — Telehealth: Payer: Self-pay | Admitting: Internal Medicine

## 2018-07-06 DIAGNOSIS — G4733 Obstructive sleep apnea (adult) (pediatric): Secondary | ICD-10-CM

## 2018-07-06 NOTE — Telephone Encounter (Signed)
Please call and let patient know that she does have severe sleep apnea and needs CPAP. We can try a machine that adjusts the flow to what she needs (an auto titration unit) or we can have her do an overnight study where they find the right settings for the machine and then get her a machine with right settings.

## 2018-07-06 NOTE — Telephone Encounter (Signed)
LVM for patient to call back and let us know which CPAP machine she would like

## 2018-07-07 ENCOUNTER — Encounter: Payer: Self-pay | Admitting: Cardiology

## 2018-07-07 ENCOUNTER — Ambulatory Visit (INDEPENDENT_AMBULATORY_CARE_PROVIDER_SITE_OTHER): Payer: Medicare Other | Admitting: Cardiology

## 2018-07-07 VITALS — BP 120/74 | HR 80 | Ht 67.0 in | Wt 261.8 lb

## 2018-07-07 DIAGNOSIS — D509 Iron deficiency anemia, unspecified: Secondary | ICD-10-CM | POA: Diagnosis not present

## 2018-07-07 DIAGNOSIS — I25118 Atherosclerotic heart disease of native coronary artery with other forms of angina pectoris: Secondary | ICD-10-CM | POA: Diagnosis not present

## 2018-07-07 DIAGNOSIS — E876 Hypokalemia: Secondary | ICD-10-CM | POA: Diagnosis not present

## 2018-07-07 DIAGNOSIS — I480 Paroxysmal atrial fibrillation: Secondary | ICD-10-CM | POA: Diagnosis not present

## 2018-07-07 DIAGNOSIS — R0602 Shortness of breath: Secondary | ICD-10-CM | POA: Diagnosis not present

## 2018-07-07 DIAGNOSIS — I5033 Acute on chronic diastolic (congestive) heart failure: Secondary | ICD-10-CM | POA: Diagnosis not present

## 2018-07-07 MED ORDER — METOLAZONE 2.5 MG PO TABS: ORAL_TABLET | ORAL | 3 refills | Status: DC

## 2018-07-07 NOTE — Progress Notes (Signed)
Cardiology Office Note   Date:  07/09/2018   ID:  Mary [name], DOB 25-Jul-1941, MRN [id number]  PCP:  Hoyt Koch, MD  Cardiologist:  Dr. [name]    Chief Complaint  Patient presents with  . Congestive Heart Failure      History of Present Illness: Mary [name] is a 77 y.o. female who presents for heart failure.  She has a hx of chronic diastolic HF, non obstructive diffuse CAD treated medicaly. Tachybrady syndrome with St Jude PPM, carotid artery disease,on flecainide,andparoxysmal atrial fib (per EP notes), colon CA s/p chemo, prior DVT, OSA, glaucoma, HTN, HLD, hypothyroidism, IDA, multinodular goiter, sleep apnea, morbid obesity s/p weight loss surgery, DM, CKD III  She was admitted 05/24/18 for heart failure. Cardiac cath 05/29/18 with non obstructive CAD. EF was 50% pt was diuresed to 268 lbs. She was AV paced in hospital. Did have anemia at Hgb 9.2  COPD exacerbation /Bronchitis. Flecainide was stopped.   Saw Dr. Rayann Heman 06/08/18 SR with V pacing. Dr. Rayann Heman noted " Could consider restarting flecainide vs tikosyn or amioadrone. For now, will follow conservatively. I think when you look at her overall picture and risks/ benefits associated with options, she would be best to return to flecainide as she did very well with this medicine and has not had obstructive CAD"  ASA was stopped, she is on Eliquis  Over last few days has had increase of her SOB, cannot lie flat at night to sleep. No fever, + productive cough with white mucus. +edema. + wt gain. Her best dry wt is 261 lbs. At discharge she was 268 lbs. Today 266 lbs but this is up from wt of 259 on 06/07/18.  No chest pain. Her PCP had increased lasix to 80 mg BID. Very SOB with exertion. She did not want to be re-admitted if possible.   Metolazone added to meds on visit 06/20/18 , labs revealed CHF and CXR without PNA  She was in SR on last visit.  anticoagulation on Eliquis .  Last  visit she was improved her wt is down 6 lbs and she was breathing much easier.  No chest pain.  She also mentions she has stopped smoking again.  She had resumed but stopped yesterday.   on lab check her Cr increased so metolazone was stopped.   Also with hypokalemia.   Today back and wt on our scales is similar she stated at home her wt is up.  She also has started smoking again but plans to stop today.  She has albuterol for her wheezes.  She does sound more hoarse today and does have non productive cough in the room.  She is having more SOB.  Again does not use salt.  She is concerned about Iron level she did receive IV iron in hospitalization.    Past Medical History:  Diagnosis Date  . Arthritis   . Atrial tachycardia ([place]) 04/23/2017  . CAD (coronary artery disease)    non obstructive  . 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 Procedure complicated by an atrial lead dislodgment and repeat procedure, 12/2013   . Carotid stenosis    Carotid US 5/16:  Bilateral ICA 1-39%; L vertebral retrograde; L BP 126/49, R BP 140/57  . Chronic combined systolic and diastolic CHF (congestive heart failure) ([place])   . Chronic lower back pain   . Colon cancer ([place])    a. s/p chemo  .  Colon polyps   . Diabetic peripheral neuropathy ([place])   . DVT (deep venous thrombosis) ([place]) 2013   "twice behind knee on left side" (06/25/2013)  . Glaucoma   . Hyperlipidemia   . Hypertension   . Hypothyroidism   . Iron deficiency anemia   . Multinodular goiter   . Osteoporosis   . PAF (paroxysmal atrial fibrillation) ([place])   . Sleep apnea    a. resolved post weight loss   . Type II diabetes mellitus ([place])     Past Surgical History:  Procedure Laterality Date  . CARDIAC CATHETERIZATION  2006   Never had PCI, 3 caths total. Last one in [place]  . CARDIAC CATHETERIZATION N/A 02/04/2015   Procedure: Left Heart Cath and Coronary Angiography;  Surgeon: Belva Crome, MD;  Location: [facility];  Service: Cardiovascular;  Laterality: N/A;  . CATARACT EXTRACTION, BILATERAL Bilateral 2013   "and put stent in my left eye for glaucoma" (06/25/2013)  . CHOLECYSTECTOMY  1980's  . COLECTOMY  10/2010   Tumor removal  . EYE MUSCLE SURGERY Bilateral ~ 1963   "muscles too long; eyes would go out and up; tied muscles to hold my eyes straight" (06/25/2013)  . LEAD REVISION  01-03-2014   atrial lead revision by Dr Rayann Heman  . LEAD REVISION N/A 01/03/2014   Procedure: LEAD REVISION;  Surgeon: Coralyn Mark, MD;  Location: [facility];  Service: Cardiovascular;  Laterality: N/A;  . LEFT HEART CATH AND CORONARY ANGIOGRAPHY N/A 05/29/2018   Procedure: LEFT HEART CATH AND CORONARY ANGIOGRAPHY;  Surgeon: Belva Crome, MD;  Location: [facility];  Service: Cardiovascular;  Laterality: N/A;  . PACEMAKER INSERTION  01/02/2014   STJ Assurity dual chamber pacemaker implnated by Dr Lovena Le for SSS  . PERMANENT PACEMAKER INSERTION N/A 01/02/2014   Procedure: PERMANENT PACEMAKER INSERTION;  Surgeon: Evans Lance, MD;  Location: [facility];  Service: Cardiovascular;  Laterality: N/A;  . PORTACATH PLACEMENT Left 11/2010  . ROUX-EN-Y GASTRIC BYPASS  2009  . ULNAR TUNNEL RELEASE Left ~ 2008  . UMBILICAL HERNIA REPAIR  1970's?    (06/25/2013)     Current Outpatient Medications  Medication Sig Dispense Refill  . albuterol (PROVENTIL HFA;VENTOLIN HFA) 108 (90 Base) MCG/ACT inhaler Inhale 2 puffs into the lungs every 6 (six) hours as needed for wheezing or shortness of breath. 1 Inhaler 2  . apixaban (ELIQUIS) 5 MG TABS tablet Take 5 mg by mouth 2 (two) times daily.    Marland Kitchen atorvastatin (LIPITOR) 80 MG tablet Take 80 mg by mouth daily at 6 PM.    . bimatoprost (LUMIGAN) 0.01 % SOLN Place 1 drop into the right eye at bedtime.     Marland Kitchen buPROPion (WELLBUTRIN XL) 150 MG 24 hr tablet Take 1 tablet (150 mg total) by mouth daily. 90 tablet 1  . furosemide (LASIX) 80 MG tablet Take  1 tablet (80 mg total) by mouth 2 (two) times daily. 180 tablet 1  . gabapentin (NEURONTIN) 300 MG capsule Take 300 mg by mouth at bedtime.     Marland Kitchen HYDROcodone-acetaminophen (NORCO/VICODIN) 5-325 MG tablet Take 1 tablet by mouth every 4 (four) hours as needed for moderate pain. 30 tablet 0  . Insulin Glargine (BASAGLAR KWIKPEN) 100 UNIT/ML SOPN Inject 0.2 mLs (20 Units total) into the skin daily. 10 pen 0  . levothyroxine (SYNTHROID, LEVOTHROID) 125 MCG tablet Take 125 mcg by mouth daily before breakfast.  5  . metoprolol succinate (TOPROL  XL) 25 MG 24 hr tablet Take 0.5 tablets (12.5 mg total) by mouth at bedtime. 90 tablet 3  . nitroGLYCERIN (NITROSTAT) 0.4 MG SL tablet Place 1 tablet (0.4 mg total) under the tongue every 5 (five) minutes as needed for chest pain. 75 tablet 2  . polyethylene glycol (MIRALAX / GLYCOLAX) packet Take 17 g by mouth 2 (two) times daily. 14 each 0  . potassium chloride SA (K-DUR,KLOR-CON) 20 MEQ tablet Take 40 mEq by mouth daily.    . metolazone (ZAROXOLYN) 2.5 MG tablet Take 1 tablet by mouth every Wed and Sat, 30 min prior to morning dose of furosemide 90 tablet 3   No current facility-administered medications for this visit.     Allergies:   Other; Adhesive [tape]; Aspirin; Latex; and Nsaids    Social History:  The patient  reports that she quit smoking about 16 months ago. Her smoking use included cigarettes. She has a 30.00 pack-year smoking history. She has never used smokeless tobacco. She reports that she drank alcohol. She reports that she does not use drugs.   Family History:  The patient's family history includes Bladder Cancer in her brother and cousin; Breast cancer in her maternal aunt, mother, and sister; Clotting disorder in her sister; Colon polyps in her brother and brother; Diabetes in her maternal grandmother, mother, and sister; Heart attack in her brother, father, and sister; Heart disease in her father; Pancreatic cancer in her brother; Prostate  cancer in her brother.    ROS:  General:no colds or fevers, no weight changes Skin:no rashes or ulcers HEENT:no blurred vision, no congestion CV:see HPI PUL:see HPI GI:no diarrhea constipation or melena, no indigestion GU:no hematuria, no dysuria MS:no joint pain, no claudication Neuro:no syncope, no lightheadedness Endo:+ diabetes stable , no thyroid disease  Wt Readings from Last 3 Encounters:  07/07/18 261 lb 12.8 oz (118.8 kg)  06/23/18 260 lb (117.9 kg)  06/20/18 266 lb 12.8 oz (121 kg)     PHYSICAL EXAM: VS:  BP 120/74   Pulse 80   Ht 5\' 7"  (1.702 m)   Wt 261 lb 12.8 oz (118.8 kg)   SpO2 96%   BMI 41.00 kg/m  , BMI Body mass index is 41 kg/m. General:Pleasant affect, NAD Skin:Warm and dry, brisk capillary refill HEENT:normocephalic, sclera clear, mucus membranes moist Neck:supple, no JVD, no bruits  Heart:S1S2 RRR without murmur, gallup, rub or click Lungs:clear without rales, occrhonchi, + wheezes CVE:LFYB, non tender, + BS, do not palpate liver spleen or masses Ext:1-2+ lower ext edema,, 2+ radial pulses Neuro:alert and oriented X 3, MAE, follows commands, + facial symmetry    EKG:  EKG is NOT ordered today.  Recent Labs: 03/21/2018: TSH 2.22 05/23/2018: B Natriuretic Peptide 196.3 06/06/2018: ALT 11 06/20/2018: Hemoglobin 10.9; NT-Pro BNP 836; Platelets 409 07/03/2018: BUN 33; Creatinine, Ser 1.34; Potassium 3.6; Sodium 139    Lipid Panel    Component Value Date/Time   CHOL 124 05/25/2018 0531   TRIG 81 05/25/2018 0531   HDL 35 (L) 05/25/2018 0531   CHOLHDL 3.5 05/25/2018 0531   VLDL 16 05/25/2018 0531   LDLCALC 73 05/25/2018 0531   LDLDIRECT 79.0 03/23/2016 1529       Other studies Reviewed: Additional studies/ records that were reviewed today include:  See previous notes on cath and echo, unable to bring over today.   ASSESSMENT AND PLAN:  1.  Heart failure, increasing SOB though wt on our scales is similar to last visit..  But will  add  metolazone on Sat and Wed. 2.5 mg prior to AM lasix.  Check BMP on Wed.  On days of metolazone take an extra lasix.  Difficult to control edema and not increase Cr.    2.   COPD with wheezes today - she has an inhaler may need pulmonary visit in future.   3.  PAF maintaining SR and pacing.  On anticoagulation with eliquis no bleeding.   4.   Hypokalemia replacing will recheck on Wed  5.  Iron def anemia, will check Iron level and send to pcp if low also check CBC  Follow up with Dr. [name] in  4-6 weeks.     Current medicines are reviewed with the patient today.  The patient Has no concerns regarding medicines.  The following changes have been made:  See above Labs/ tests ordered today include:see above  Disposition:   FU:  see above  Signed, Cecilie Kicks, NP  07/09/2018 3:13 PM    [facility] [place], [place], [place], [place] Phone: [phone]; Fax: [phone]

## 2018-07-07 NOTE — Patient Instructions (Addendum)
Medication Instructions:  Your physician has recommended you make the following change in your medication:  1.) restart metolazone 2.5 mg every Wed and Saturday, 30 min prior to morning dose of furosemide.   If you need a refill on your cardiac medications before your next appointment, please call your pharmacy.   Lab work: On 07/12/18: bmet, cbc, iron  If you have labs (blood work) drawn today and your tests are completely normal, you will receive your results only by: Marland Kitchen MyChart Message (if you have MyChart) OR . A paper copy in the mail If you have any lab test that is abnormal or we need to change your treatment, we will call you to review the results.  Testing/Procedures: none  Follow-Up:  Any Other Special Instructions Will Be Listed Below (If Applicable). Low carbohydrate diet

## 2018-07-13 ENCOUNTER — Other Ambulatory Visit: Payer: Medicare Other

## 2018-07-13 ENCOUNTER — Other Ambulatory Visit: Payer: Self-pay | Admitting: *Deleted

## 2018-07-13 ENCOUNTER — Other Ambulatory Visit: Payer: Medicare Other | Admitting: *Deleted

## 2018-07-13 DIAGNOSIS — D509 Iron deficiency anemia, unspecified: Secondary | ICD-10-CM

## 2018-07-13 DIAGNOSIS — E876 Hypokalemia: Secondary | ICD-10-CM

## 2018-07-13 DIAGNOSIS — Z79899 Other long term (current) drug therapy: Secondary | ICD-10-CM

## 2018-07-13 DIAGNOSIS — I480 Paroxysmal atrial fibrillation: Secondary | ICD-10-CM

## 2018-07-13 LAB — BASIC METABOLIC PANEL
BUN / CREAT RATIO: 22 (ref 12–28)
BUN: 29 mg/dL — ABNORMAL HIGH (ref 8–27)
CHLORIDE: 87 mmol/L — AB (ref 96–106)
CO2: 31 mmol/L — ABNORMAL HIGH (ref 20–29)
Calcium: 9.6 mg/dL (ref 8.7–10.3)
Creatinine, Ser: 1.34 mg/dL — ABNORMAL HIGH (ref 0.57–1.00)
GFR calc non Af Amer: 39 mL/min/{1.73_m2} — ABNORMAL LOW (ref >59)
GFR, EST AFRICAN AMERICAN: 44 mL/min/{1.73_m2} — AB (ref >59)
Glucose: 134 mg/dL — ABNORMAL HIGH (ref 65–99)
POTASSIUM: 3.2 mmol/L — AB (ref 3.5–5.2)
SODIUM: 136 mmol/L (ref 134–144)

## 2018-07-13 LAB — CBC
HEMATOCRIT: 35.1 % (ref 34.0–46.6)
HEMOGLOBIN: 11.7 g/dL (ref 11.1–15.9)
MCH: 26.2 pg — AB (ref 26.6–33.0)
MCHC: 33.3 g/dL (ref 31.5–35.7)
MCV: 79 fL (ref 79–97)
Platelets: 422 10*3/uL (ref 150–450)
RBC: 4.47 x10E6/uL (ref 3.77–5.28)
RDW: 17.1 % — AB (ref 12.3–15.4)
WBC: 10.8 10*3/uL (ref 3.4–10.8)

## 2018-07-13 LAB — IRON: Iron: 40 ug/dL (ref 27–139)

## 2018-07-20 ENCOUNTER — Other Ambulatory Visit: Payer: Medicare Other | Admitting: *Deleted

## 2018-07-20 DIAGNOSIS — Z79899 Other long term (current) drug therapy: Secondary | ICD-10-CM

## 2018-07-20 DIAGNOSIS — I480 Paroxysmal atrial fibrillation: Secondary | ICD-10-CM | POA: Diagnosis not present

## 2018-07-20 DIAGNOSIS — E876 Hypokalemia: Secondary | ICD-10-CM | POA: Diagnosis not present

## 2018-07-21 ENCOUNTER — Telehealth: Payer: Self-pay

## 2018-07-21 DIAGNOSIS — E876 Hypokalemia: Secondary | ICD-10-CM

## 2018-07-21 LAB — BASIC METABOLIC PANEL
BUN/Creatinine Ratio: 32 — ABNORMAL HIGH (ref 12–28)
BUN: 39 mg/dL — ABNORMAL HIGH (ref 8–27)
CHLORIDE: 84 mmol/L — AB (ref 96–106)
CO2: 32 mmol/L — AB (ref 20–29)
Calcium: 9.3 mg/dL (ref 8.7–10.3)
Creatinine, Ser: 1.23 mg/dL — ABNORMAL HIGH (ref 0.57–1.00)
GFR calc Af Amer: 49 mL/min/{1.73_m2} — ABNORMAL LOW (ref >59)
GFR calc non Af Amer: 43 mL/min/{1.73_m2} — ABNORMAL LOW (ref >59)
Glucose: 149 mg/dL — ABNORMAL HIGH (ref 65–99)
POTASSIUM: 3.1 mmol/L — AB (ref 3.5–5.2)
SODIUM: 134 mmol/L (ref 134–144)

## 2018-07-21 MED ORDER — POTASSIUM CHLORIDE CRYS ER 20 MEQ PO TBCR: 40.0000 meq | EXTENDED_RELEASE_TABLET | Freq: Two times a day (BID) | ORAL | 5 refills | Status: DC

## 2018-07-21 NOTE — Telephone Encounter (Signed)
-----   Message from Isaiah Serge, NP sent at 07/21/2018  7:44 AM EST ----- Have pt take Kdur 40 meq BID , but for today only take 3 times a day, then recheck BMP in 2 weeks.  Otherwise labs are good.

## 2018-07-21 NOTE — Telephone Encounter (Signed)
Called and made patient aware of lab results. Instructed patient to take 40 mEq of K-dur three times today only and then starting tomorrow she is to take 40 mEq BID of K-dur. Patient has follow up appointment scheduled with Cecilie Kicks, NP on 12/5 and will have her BMET rechecked at that time. Patient verbalized understanding and thanked me for the call. Rx sent in to patient's preferred pharmacy.

## 2018-08-02 NOTE — Progress Notes (Signed)
Cardiology Office Note   Date:  08/03/2018   ID:  Mary [name], DOB 05-Jan-1941, MRN [id number]  PCP:  Hoyt Koch, MD  Cardiologist:  Dr. [name]    Chief Complaint  Patient presents with  . Congestive Heart Failure      History of Present Illness: Mary [name] is a 77 y.o. female who presents for CHF   She has a hx of chronic diastolic HF, non obstructive diffuse CAD treated medicaly. Tachybrady syndrome with St Jude PPM, carotid artery disease,on flecainide,andparoxysmal atrial fib (per EP notes), colon CA s/p chemo, prior DVT, OSA, glaucoma, HTN, HLD, hypothyroidism, IDA, multinodular goiter, sleep apnea, morbid obesity s/p weight loss surgery, DM, CKD III  She was admitted 05/24/18 for heart failure. Cardiac cath 05/29/18 with non obstructive CAD. EF was 50% pt was diuresed to 268 lbs. She was AV paced in hospital. Did have anemia at Hgb 9.2  COPD exacerbation /Bronchitis. Flecainide was stopped.   Saw Dr. Rayann Heman 06/08/18 SR with V pacing. Dr. Rayann Heman noted " Could consider restarting flecainide vs tikosyn or amioadrone. For now, will follow conservatively. I think when you look at her overall picture and risks/ benefits associated with options, she would be best to return to flecainide as she did very well with this medicine and has not had obstructive CAD"  ASA was stopped, she is on Eliquis  Over last few days has had increase of her SOB, cannot lie flat at night to sleep. No fever, + productive cough with white mucus. +edema. + wt gain. Her best dry wt is 261 lbs. At discharge she was 268 lbs. Today 266 lbs but this is up from wt of 259 on 06/07/18.  No chest pain. Her PCP had increased lasix to 80 mg BID. Very SOB with exertion. She didnot want to be re-admitted if possible.   Metolazone added to [facility] visit 06/20/18, labs revealed CHF and CXR without PNA She was in SR on last visit. anticoagulation on Eliquis .  Last visit she was  improved her wt is down 6 lbs and she was breathing much easier. No chest pain. She also mentions she has stopped smoking again. She had resumed but stopped yesterday. on lab check her Cr increased so metolazone was stopped.   Also with hypokalemia.     07/07/18 back and wt on our scales is similar she stated at home her wt is up.  She also has started smoking again but plans to stop today.  She has albuterol for her wheezes.  She does sound more hoarse today and does have non productive cough in the room.  She is having more SOB.  Again does not use salt.  She is concerned about Iron level she did receive IV iron in hospitalization.    Today she look improved, no acute SOB though continues to prop up to sleep.  She did have sleep study and has CPAP at home and did rest when she wore it.  Continues to smoke 1 PPD.  She and her husband both.  No chest pain.  Wt is down from last visit by 5 lbs.  Mild lower ext edema.    Past Medical History:  Diagnosis Date  . Arthritis   . Atrial tachycardia ([place]) 04/23/2017  . CAD (coronary artery disease)    non obstructive  . 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 Procedure complicated by an atrial lead dislodgment and  repeat procedure, 12/2013   . Carotid stenosis    Carotid US 5/16:  Bilateral ICA 1-39%; L vertebral retrograde; L BP 126/49, R BP 140/57  . Chronic combined systolic and diastolic CHF (congestive heart failure) ([place])   . Chronic lower back pain   . Colon cancer ([place])    a. s/p chemo  . Colon polyps   . Diabetic peripheral neuropathy ([place])   . DVT (deep venous thrombosis) ([place]) 2013   "twice behind knee on left side" (06/25/2013)  . Glaucoma   . Hyperlipidemia   . Hypertension   . Hypothyroidism   . Iron deficiency anemia   . Multinodular goiter   . Osteoporosis   . PAF (paroxysmal atrial fibrillation) ([place])   . Sleep apnea    a. resolved post weight loss   . Type II  diabetes mellitus ([place])     Past Surgical History:  Procedure Laterality Date  . CARDIAC CATHETERIZATION  2006   Never had PCI, 3 caths total. Last one in [place]  . CARDIAC CATHETERIZATION N/A 02/04/2015   Procedure: Left Heart Cath and Coronary Angiography;  Surgeon: Belva Crome, MD;  Location: [facility];  Service: Cardiovascular;  Laterality: N/A;  . CATARACT EXTRACTION, BILATERAL Bilateral 2013   "and put stent in my left eye for glaucoma" (06/25/2013)  . CHOLECYSTECTOMY  1980's  . COLECTOMY  10/2010   Tumor removal  . EYE MUSCLE SURGERY Bilateral ~ 1963   "muscles too long; eyes would go out and up; tied muscles to hold my eyes straight" (06/25/2013)  . LEAD REVISION  01-03-2014   atrial lead revision by Dr Rayann Heman  . LEAD REVISION N/A 01/03/2014   Procedure: LEAD REVISION;  Surgeon: Coralyn Mark, MD;  Location: [facility];  Service: Cardiovascular;  Laterality: N/A;  . LEFT HEART CATH AND CORONARY ANGIOGRAPHY N/A 05/29/2018   Procedure: LEFT HEART CATH AND CORONARY ANGIOGRAPHY;  Surgeon: Belva Crome, MD;  Location: [facility];  Service: Cardiovascular;  Laterality: N/A;  . PACEMAKER INSERTION  01/02/2014   STJ Assurity dual chamber pacemaker implnated by Dr Lovena Le for SSS  . PERMANENT PACEMAKER INSERTION N/A 01/02/2014   Procedure: PERMANENT PACEMAKER INSERTION;  Surgeon: Evans Lance, MD;  Location: [facility];  Service: Cardiovascular;  Laterality: N/A;  . PORTACATH PLACEMENT Left 11/2010  . ROUX-EN-Y GASTRIC BYPASS  2009  . ULNAR TUNNEL RELEASE Left ~ 2008  . UMBILICAL HERNIA REPAIR  1970's?    (06/25/2013)     Current Outpatient Medications  Medication Sig Dispense Refill  . albuterol (PROVENTIL HFA;VENTOLIN HFA) 108 (90 Base) MCG/ACT inhaler Inhale 2 puffs into the lungs every 6 (six) hours as needed for wheezing or shortness of breath. 1 Inhaler 2  . apixaban (ELIQUIS) 5 MG TABS tablet Take 5 mg by mouth 2 (two) times daily.    Marland Kitchen atorvastatin  (LIPITOR) 80 MG tablet Take 80 mg by mouth daily at 6 PM.    . bimatoprost (LUMIGAN) 0.01 % SOLN Place 1 drop into the right eye at bedtime.     Marland Kitchen buPROPion (WELLBUTRIN XL) 150 MG 24 hr tablet Take 1 tablet (150 mg total) by mouth daily. 90 tablet 1  . furosemide (LASIX) 80 MG tablet Take 1 tablet (80 mg total) by mouth 2 (two) times daily. 180 tablet 1  . gabapentin (NEURONTIN) 300 MG capsule Take 300 mg by mouth 2 (two) times daily.     Marland Kitchen HYDROcodone-acetaminophen (NORCO/VICODIN) 5-325 MG tablet  Take 1 tablet by mouth every 4 (four) hours as needed for moderate pain. 30 tablet 0  . Insulin Glargine (BASAGLAR KWIKPEN) 100 UNIT/ML SOPN Inject 0.2 mLs (20 Units total) into the skin daily. 10 pen 0  . levothyroxine (SYNTHROID, LEVOTHROID) 125 MCG tablet Take 125 mcg by mouth daily before breakfast.  5  . metolazone (ZAROXOLYN) 2.5 MG tablet Take 1 tablet by mouth every Wed and Sat, 30 min prior to morning dose of furosemide 90 tablet 3  . metoprolol succinate (TOPROL XL) 25 MG 24 hr tablet Take 0.5 tablets (12.5 mg total) by mouth at bedtime. 90 tablet 3  . nitroGLYCERIN (NITROSTAT) 0.4 MG SL tablet Place 1 tablet (0.4 mg total) under the tongue every 5 (five) minutes as needed for chest pain. 75 tablet 2  . potassium chloride SA (K-DUR,KLOR-CON) 20 MEQ tablet Take 2 tablets (40 mEq total) by mouth 2 (two) times daily. 120 tablet 5   No current facility-administered medications for this visit.     Allergies:   Other; Adhesive [tape]; Aspirin; Latex; and Nsaids    Social History:  The patient  reports that she quit smoking about 17 months ago. Her smoking use included cigarettes. She has a 30.00 pack-year smoking history. She has never used smokeless tobacco. She reports that she drank alcohol. She reports that she does not use drugs.   Family History:  The patient's family history includes Bladder Cancer in her brother and cousin; Breast cancer in her maternal aunt, mother, and sister; Clotting  disorder in her sister; Colon polyps in her brother and brother; Diabetes in her maternal grandmother, mother, and sister; Heart attack in her brother, father, and sister; Heart disease in her father; Pancreatic cancer in her brother; Prostate cancer in her brother.    ROS:  General:no colds or fevers, no weight changes Skin:no rashes or ulcers HEENT:no blurred vision, no congestion CV:see HPI PUL:see HPI GI:no diarrhea constipation or melena, no indigestion GU:no hematuria, no dysuria MS:no joint pain, no claudication Neuro:no syncope, no lightheadedness Endo:no diabetes, + thyroid disease  Wt Readings from Last 3 Encounters:  08/03/18 255 lb 1.9 oz (115.7 kg)  07/07/18 261 lb 12.8 oz (118.8 kg)  06/23/18 260 lb (117.9 kg)     PHYSICAL EXAM: VS:  BP 132/64   Pulse 76   Ht 5\' 7"  (1.702 m)   Wt 255 lb 1.9 oz (115.7 kg)   SpO2 95%   BMI 39.96 kg/m  , BMI Body mass index is 39.96 kg/m. General:Pleasant affect, NAD Skin:Warm and dry, brisk capillary refill HEENT:normocephalic, sclera clear, mucus membranes moist Neck:supple, no JVD, no bruits sitting upright Heart:S1S2 RRR without murmur, gallup, rub or click Lungs:clear without rales, rhonchi, + wheezes NWG:NFAO, non tender, + BS, do not palpate liver spleen or masses Ext:tr lower ext edema with support stockings in place , 1+ pedal pulses, 2+ radial pulses Neuro:alert and oriented X 3, MAE, follows commands, + facial symmetry    EKG:  EKG is NOT ordered today.    Recent Labs: 03/21/2018: TSH 2.22 05/23/2018: B Natriuretic Peptide 196.3 06/06/2018: ALT 11 06/20/2018: NT-Pro BNP 836 07/13/2018: Hemoglobin 11.7; Platelets 422 07/20/2018: BUN 39; Creatinine, Ser 1.23; Potassium 3.1; Sodium 134    Lipid Panel    Component Value Date/Time   CHOL 124 05/25/2018 0531   TRIG 81 05/25/2018 0531   HDL 35 (L) 05/25/2018 0531   CHOLHDL 3.5 05/25/2018 0531   VLDL 16 05/25/2018 0531   LDLCALC 73 05/25/2018 0531  LDLDIRECT  79.0 03/23/2016 1529       Other studies Reviewed: Additional studies/ records that were reviewed today include: . Cardiac cath 05/29/18  When compared to prior coronary angiography, no significant change has occurred.  Proximal/ostial 25% left main.  Calcified proximal to mid LAD with 20 to 30% narrowing.  Distal to apical LAD diffusely involved with up to 50 to 70% narrowing.  Apical LAD actually appears improved compared to prior.  Circumflex is patent with irregularities noted in the circumflex and obtuse marginal branches up to 40 to 50%.  Diffuse luminal irregularities throughout the right coronary up to 40%.  Right coronary is dominant.  Dyssynergy of LV contractility pattern likely related to RV pacing.  There may be focal apical akinesis.  EF is 50%.  LVEDP is normal.  RECOMMENDATIONS:   Primary risk prevention with aggressive lipid-lowering, hemoglobin A1c less than 7, weight loss, evaluation and treatment of probable sleep apnea, blood pressure target less than or equal to 130/80 mmHg, and moderate intensity aerobic activity greater than 150 minutes/week.  Echo 05/26/18 Study Conclusions  - Left ventricle: Systolic function was normal. The estimated   ejection fraction was in the range of 55% to 60%. There is   hypokinesis of the apical septal myocardium.  Impressions:  - Limited study with definity to evaluate wall motion; akinesis of   the distal septum with overall preserved LV function.  ASSESSMENT AND PLAN:  1.  Chronic diastolic HF stable today no change in meds, will check BMP follow up with Dr. [name] in Jan.   2.  Hypokalemia will check with Dr. [name] without metolazone twice a week her edema returns.  Some of her dyspnea is most likely related to her tobacco.   3.  PAF maintaining SR on anticoagulation with Eliquis     Current medicines are reviewed with the patient today.  The patient Has no concerns regarding medicines.  The following  changes have been made:  See above Labs/ tests ordered today include:see above  Disposition:   FU:  see above  Signed, Cecilie Kicks, NP  08/03/2018 11:45 AM    [facility], [place], [address] [place], [place] Phone: [phone]; Fax: [phone]

## 2018-08-03 ENCOUNTER — Encounter: Payer: Self-pay | Admitting: Cardiology

## 2018-08-03 ENCOUNTER — Ambulatory Visit (INDEPENDENT_AMBULATORY_CARE_PROVIDER_SITE_OTHER): Payer: Medicare Other | Admitting: Cardiology

## 2018-08-03 ENCOUNTER — Other Ambulatory Visit: Payer: Medicare Other

## 2018-08-03 VITALS — BP 132/64 | HR 76 | Ht 67.0 in | Wt 255.1 lb

## 2018-08-03 DIAGNOSIS — Z79899 Other long term (current) drug therapy: Secondary | ICD-10-CM

## 2018-08-03 DIAGNOSIS — I480 Paroxysmal atrial fibrillation: Secondary | ICD-10-CM

## 2018-08-03 DIAGNOSIS — I25118 Atherosclerotic heart disease of native coronary artery with other forms of angina pectoris: Secondary | ICD-10-CM

## 2018-08-03 DIAGNOSIS — E876 Hypokalemia: Secondary | ICD-10-CM

## 2018-08-03 DIAGNOSIS — Z720 Tobacco use: Secondary | ICD-10-CM | POA: Diagnosis not present

## 2018-08-03 DIAGNOSIS — I5032 Chronic diastolic (congestive) heart failure: Secondary | ICD-10-CM

## 2018-08-03 LAB — BASIC METABOLIC PANEL
BUN/Creatinine Ratio: 28 (ref 12–28)
BUN: 37 mg/dL — ABNORMAL HIGH (ref 8–27)
CALCIUM: 9.4 mg/dL (ref 8.7–10.3)
CO2: 30 mmol/L — ABNORMAL HIGH (ref 20–29)
Chloride: 87 mmol/L — ABNORMAL LOW (ref 96–106)
Creatinine, Ser: 1.33 mg/dL — ABNORMAL HIGH (ref 0.57–1.00)
GFR calc Af Amer: 45 mL/min/{1.73_m2} — ABNORMAL LOW (ref >59)
GFR calc non Af Amer: 39 mL/min/{1.73_m2} — ABNORMAL LOW (ref >59)
GLUCOSE: 202 mg/dL — AB (ref 65–99)
POTASSIUM: 3.2 mmol/L — AB (ref 3.5–5.2)
SODIUM: 136 mmol/L (ref 134–144)

## 2018-08-03 NOTE — Patient Instructions (Addendum)
Medication Instructions:  Your physician recommends that you continue on your current medications as directed. Please refer to the Current Medication list given to you today.  If you need a refill on your cardiac medications before your next appointment, please call your pharmacy.   Lab work: TODAY:  BMET  If you have labs (blood work) drawn today and your tests are completely normal, you will receive your results only by: Marland Kitchen MyChart Message (if you have MyChart) OR . A paper copy in the mail If you have any lab test that is abnormal or we need to change your treatment, we will call you to review the results.  Testing/Procedures: None ordered  Follow-Up: Your physician recommends that you schedule a follow-up appointment in: [place] January AS SCHEDULED   Any Other Special Instructions Will Be Listed Below (If Applicable).

## 2018-08-07 ENCOUNTER — Telehealth: Payer: Self-pay | Admitting: *Deleted

## 2018-08-07 DIAGNOSIS — Z79899 Other long term (current) drug therapy: Secondary | ICD-10-CM

## 2018-08-07 NOTE — Telephone Encounter (Signed)
Called pt re: lab results, left a message for her to call back  

## 2018-08-07 NOTE — Telephone Encounter (Signed)
-----   Message from Isaiah Serge, NP sent at 08/04/2018  2:39 PM EST ----- Let [name] know I discussed with Dr. [name] and she needs to stop metolazone and begin aldactone 12.5 mg daily.  BMET in 1 week.

## 2018-08-09 MED ORDER — SPIRONOLACTONE 25 MG PO TABS: 12.5000 mg | ORAL_TABLET | Freq: Every day | ORAL | 3 refills | Status: DC

## 2018-08-17 ENCOUNTER — Other Ambulatory Visit: Payer: Medicare Other

## 2018-08-17 DIAGNOSIS — Z79899 Other long term (current) drug therapy: Secondary | ICD-10-CM | POA: Diagnosis not present

## 2018-08-18 LAB — BASIC METABOLIC PANEL
BUN / CREAT RATIO: 20 (ref 12–28)
BUN: 25 mg/dL (ref 8–27)
CHLORIDE: 91 mmol/L — AB (ref 96–106)
CO2: 28 mmol/L (ref 20–29)
Calcium: 9 mg/dL (ref 8.7–10.3)
Creatinine, Ser: 1.28 mg/dL — ABNORMAL HIGH (ref 0.57–1.00)
GFR calc Af Amer: 47 mL/min/{1.73_m2} — ABNORMAL LOW (ref >59)
GFR calc non Af Amer: 41 mL/min/{1.73_m2} — ABNORMAL LOW (ref >59)
GLUCOSE: 217 mg/dL — AB (ref 65–99)
Potassium: 4.5 mmol/L (ref 3.5–5.2)
SODIUM: 135 mmol/L (ref 134–144)

## 2018-08-19 ENCOUNTER — Other Ambulatory Visit: Payer: Self-pay | Admitting: Internal Medicine

## 2018-08-20 LAB — CUP PACEART REMOTE DEVICE CHECK
Battery Remaining Longevity: 92 mo
Battery Remaining Percentage: 95.5 %
Battery Voltage: 2.98 V
Brady Statistic AP VP Percent: 58 %
Brady Statistic AP VS Percent: 1 %
Brady Statistic AS VS Percent: 1 %
Brady Statistic RA Percent Paced: 58 %
Date Time Interrogation Session: 20191021150921
Implantable Lead Implant Date: 20150506
Implantable Lead Implant Date: 20150506
Implantable Lead Location: 753860
Implantable Pulse Generator Implant Date: 20150506
Lead Channel Impedance Value: 360 Ohm
Lead Channel Pacing Threshold Amplitude: 0.75 V
Lead Channel Pacing Threshold Amplitude: 0.75 V
Lead Channel Pacing Threshold Pulse Width: 0.4 ms
Lead Channel Pacing Threshold Pulse Width: 0.4 ms
Lead Channel Sensing Intrinsic Amplitude: 0.8 mV
Lead Channel Setting Pacing Amplitude: 2.5 V
Lead Channel Setting Pacing Amplitude: 2.5 V
Lead Channel Setting Pacing Pulse Width: 0.4 ms
MDC IDC LEAD LOCATION: 753859
MDC IDC MSMT LEADCHNL RV IMPEDANCE VALUE: 410 Ohm
MDC IDC MSMT LEADCHNL RV SENSING INTR AMPL: 12 mV
MDC IDC SET LEADCHNL RV SENSING SENSITIVITY: 4 mV
MDC IDC STAT BRADY AS VP PERCENT: 42 %
MDC IDC STAT BRADY RV PERCENT PACED: 99 %
Pulse Gen Model: 2240
Pulse Gen Serial Number: 3013730

## 2018-09-05 ENCOUNTER — Encounter: Payer: Self-pay | Admitting: Internal Medicine

## 2018-09-05 ENCOUNTER — Ambulatory Visit (INDEPENDENT_AMBULATORY_CARE_PROVIDER_SITE_OTHER): Payer: Medicare Other | Admitting: Internal Medicine

## 2018-09-05 VITALS — BP 150/78 | HR 83 | Temp 98.5°F | Ht 67.0 in | Wt 258.0 lb

## 2018-09-05 DIAGNOSIS — M8949 Other hypertrophic osteoarthropathy, multiple sites: Secondary | ICD-10-CM

## 2018-09-05 DIAGNOSIS — G4733 Obstructive sleep apnea (adult) (pediatric): Secondary | ICD-10-CM | POA: Diagnosis not present

## 2018-09-05 DIAGNOSIS — M150 Primary generalized (osteo)arthritis: Secondary | ICD-10-CM

## 2018-09-05 DIAGNOSIS — F17210 Nicotine dependence, cigarettes, uncomplicated: Secondary | ICD-10-CM | POA: Diagnosis not present

## 2018-09-05 DIAGNOSIS — J449 Chronic obstructive pulmonary disease, unspecified: Secondary | ICD-10-CM | POA: Diagnosis not present

## 2018-09-05 DIAGNOSIS — Z720 Tobacco use: Secondary | ICD-10-CM

## 2018-09-05 DIAGNOSIS — M159 Polyosteoarthritis, unspecified: Secondary | ICD-10-CM

## 2018-09-05 MED ORDER — HYDROCODONE-ACETAMINOPHEN 5-325 MG PO TABS: 1.0000 | ORAL_TABLET | ORAL | 0 refills | Status: DC | PRN

## 2018-09-05 NOTE — Progress Notes (Signed)
   Subjective:   Patient ID: Mary [name], female    DOB: 1941/02/27, 78 y.o.   MRN: [id number]  HPI The patient is a 78 YO female coming in for several concerns including follow up starting CPAP for her OSA (using 3-4 hours per night, some days per week denies problems with mask or fit, does feel improvement in daytime sleepiness, cognition), and osteoarthritis (uses hydrocodone rarely, last fill July and generally uses only if last resort, normally takes tylenol instead, denies recent falls, has prior knee replacement, denies swelling or redness in joints, she denies side effects or getting from another provider, on eliquis so cannot take nsaids regularly), and her breathing (she is doing okay with that, some worse in the winter time, activity is limited due to breathing and arthritis, uses CPAP at night time now which is helping some, uses albuterol prn but not more lately, denies fevers or chills).   Review of Systems  Constitutional: Positive for activity change and fatigue. Negative for appetite change, fever and unexpected weight change.  HENT: Negative.   Eyes: Negative.   Respiratory: Positive for shortness of breath. Negative for cough and chest tightness.   Cardiovascular: Negative for chest pain, palpitations and leg swelling.  Gastrointestinal: Negative for abdominal distention, abdominal pain, constipation, diarrhea, nausea and vomiting.  Musculoskeletal: Positive for arthralgias and gait problem. Negative for joint swelling, myalgias and neck pain.  Skin: Negative.   Neurological: Positive for weakness. Negative for dizziness, light-headedness and headaches.  Psychiatric/Behavioral: Negative.     Objective:  Physical Exam Constitutional:      Appearance: She is well-developed. She is obese.  HENT:     Head: Normocephalic and atraumatic.  Neck:     Musculoskeletal: Normal range of motion.  Cardiovascular:     Rate and Rhythm: Normal rate and regular rhythm.  Pulmonary:    Effort: Pulmonary effort is normal. No respiratory distress.     Breath sounds: Rhonchi present. No wheezing or rales.     Comments: Stable lung exam Abdominal:     General: Bowel sounds are normal. There is no distension.     Palpations: Abdomen is soft.     Tenderness: There is no abdominal tenderness. There is no rebound.  Musculoskeletal:        General: Tenderness present.  Skin:    General: Skin is warm and dry.  Neurological:     Mental Status: She is alert and oriented to person, place, and time.     Coordination: Coordination normal.     Vitals:   09/05/18 1538  BP: (!) 150/78  Pulse: 83  Temp: 98.5 F (36.9 C)  TempSrc: Oral  SpO2: 96%  Weight: 258 lb (117 kg)  Height: 5\' 7"  (1.702 m)    Assessment & Plan:

## 2018-09-05 NOTE — Patient Instructions (Addendum)
We will not make any changes today.  We will check the iron levels next time.

## 2018-09-08 NOTE — Assessment & Plan Note (Signed)
Seems stable today without flare. Using albuterol prn and we talked about inhalers to add on and she declines at this time.

## 2018-09-08 NOTE — Assessment & Plan Note (Signed)
Time spent counseling about tobacco usage: 3 minutes. I have asked about smoking and is smoking same as usual. The patient is advised to quit. The patient is not willing to quit. They would like to try to quit in the next 12 months. We will follow up with them in 12 months.  

## 2018-09-08 NOTE — Assessment & Plan Note (Signed)
Rx for hydrocodone today small supply which she uses judiciously. She is aware of risk of dependence, addiction, death and is willing to accept. She denies side effects. Database reviewed and no inappropriate fills. #30 no refills today done.

## 2018-09-08 NOTE — Assessment & Plan Note (Signed)
Using CPAP but not enough for insurance guidelines and instructed her in this so she can use properly. She is getting benefit when using.

## 2018-09-18 ENCOUNTER — Ambulatory Visit (INDEPENDENT_AMBULATORY_CARE_PROVIDER_SITE_OTHER): Payer: Medicare Other

## 2018-09-18 DIAGNOSIS — I442 Atrioventricular block, complete: Secondary | ICD-10-CM | POA: Diagnosis not present

## 2018-09-19 LAB — CUP PACEART REMOTE DEVICE CHECK
Battery Remaining Longevity: 88 mo
Battery Remaining Percentage: 95.5 %
Battery Voltage: 2.96 V
Brady Statistic AP VP Percent: 74 %
Brady Statistic AP VS Percent: 1 %
Brady Statistic AS VP Percent: 26 %
Brady Statistic AS VS Percent: 1 %
Brady Statistic RA Percent Paced: 73 %
Brady Statistic RV Percent Paced: 99 %
Implantable Lead Implant Date: 20150506
Implantable Lead Implant Date: 20150506
Implantable Lead Location: 753859
Implantable Lead Location: 753860
Implantable Pulse Generator Implant Date: 20150506
Lead Channel Impedance Value: 380 Ohm
Lead Channel Impedance Value: 410 Ohm
Lead Channel Pacing Threshold Amplitude: 0.75 V
Lead Channel Pacing Threshold Amplitude: 0.75 V
Lead Channel Pacing Threshold Pulse Width: 0.4 ms
Lead Channel Pacing Threshold Pulse Width: 0.4 ms
Lead Channel Sensing Intrinsic Amplitude: 0.9 mV
Lead Channel Sensing Intrinsic Amplitude: 12 mV
Lead Channel Setting Pacing Amplitude: 2.5 V
Lead Channel Setting Pacing Amplitude: 2.5 V
Lead Channel Setting Pacing Pulse Width: 0.4 ms
Lead Channel Setting Sensing Sensitivity: 4 mV
MDC IDC PG SERIAL: 3013730
MDC IDC SESS DTM: 20200120103525
Pulse Gen Model: 2240

## 2018-09-19 NOTE — Progress Notes (Signed)
Remote pacemaker transmission.   

## 2018-09-24 NOTE — Progress Notes (Signed)
Cardiology Office Note:    Date:  09/25/2018   ID:  Mary [name], DOB 1940/11/09, MRN [id number]  PCP:  Hoyt Koch, MD  Cardiologist:  Sinclair Grooms, MD   Referring MD: Hoyt Koch, *   Chief Complaint  Patient presents with  . Atrial Fibrillation  . Congestive Heart Failure    History of Present Illness:    Mary [name] is a 78 y.o. 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  She feels great.  She has switched providers.  She now sees Dr. Sharlet Salina.  Was previously followed at [facility] endocrinology.  Last hemoglobin A1c recorded was greater than 11.  Recently diagnosed with sleep apnea and notices dramatic improvement in her energy and sleep pattern.  Lower extremity swelling has improved.  She denies angina, prolonged palpitations, syncope, and bleeding.  No recent falls or injury.  Past Medical History:  Diagnosis Date  . Arthritis   . Atrial tachycardia ([place]) 04/23/2017  . CAD (coronary artery disease)    non obstructive  . 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 Procedure complicated by an atrial lead dislodgment and repeat procedure, 12/2013   . Carotid stenosis    Carotid US 5/16:  Bilateral ICA 1-39%; L vertebral retrograde; L BP 126/49, R BP 140/57  . Chronic combined systolic and diastolic CHF (congestive heart failure) ([place])   . Chronic lower back pain   . Colon cancer ([place])    a. s/p chemo  . Colon polyps   . Diabetic peripheral neuropathy ([place])   . DVT (deep venous thrombosis) ([place]) 2013   "twice behind knee on left side" (06/25/2013)  . Glaucoma   . Hyperlipidemia   . Hypertension   . Hypothyroidism   . Iron deficiency anemia   . Multinodular goiter   . Osteoporosis   . PAF (paroxysmal atrial  fibrillation) ([place])   . Sleep apnea    a. resolved post weight loss   . Type II diabetes mellitus ([place])     Past Surgical History:  Procedure Laterality Date  . CARDIAC CATHETERIZATION  2006   Never had PCI, 3 caths total. Last one in [place]  . CARDIAC CATHETERIZATION N/A 02/04/2015   Procedure: Left Heart Cath and Coronary Angiography;  Surgeon: Belva Crome, MD;  Location: [place] CV LAB;  Service: Cardiovascular;  Laterality: N/A;  . CATARACT EXTRACTION, BILATERAL Bilateral 2013   "and put stent in my left eye for glaucoma" (06/25/2013)  . CHOLECYSTECTOMY  1980's  . COLECTOMY  10/2010   Tumor removal  . EYE MUSCLE SURGERY Bilateral ~ 1963   "muscles too long; eyes would go out and up; tied muscles to hold my eyes straight" (06/25/2013)  . LEAD REVISION  01-03-2014   atrial lead revision by Dr Rayann Heman  . LEAD REVISION N/A 01/03/2014   Procedure: LEAD REVISION;  Surgeon: Coralyn Mark, MD;  Location: [place] CATH LAB;  Service: Cardiovascular;  Laterality: N/A;  . LEFT HEART CATH AND CORONARY ANGIOGRAPHY N/A 05/29/2018   Procedure: LEFT HEART CATH AND CORONARY ANGIOGRAPHY;  Surgeon: Belva Crome, MD;  Location: [employer] CV LAB;  Service: Cardiovascular;  Laterality: N/A;  . PACEMAKER INSERTION  01/02/2014   STJ Assurity dual chamber pacemaker implnated by  Dr Lovena Le for SSS  . PERMANENT PACEMAKER INSERTION N/A 01/02/2014   Procedure: PERMANENT PACEMAKER INSERTION;  Surgeon: Evans Lance, MD;  Location: [facility] CATH LAB;  Service: Cardiovascular;  Laterality: N/A;  . PORTACATH PLACEMENT Left 11/2010  . ROUX-EN-Y GASTRIC BYPASS  2009  . ULNAR TUNNEL RELEASE Left ~ 2008  . UMBILICAL HERNIA REPAIR  1970's?    (06/25/2013)    Current Medications: Current Meds  Medication Sig  . albuterol (PROVENTIL HFA;VENTOLIN HFA) 108 (90 Base) MCG/ACT inhaler Inhale 2 puffs into the lungs every 6 (six) hours as needed for wheezing or shortness of breath.  Marland Kitchen apixaban (ELIQUIS) 5 MG TABS tablet Take 5 mg  by mouth 2 (two) times daily.  Marland Kitchen atorvastatin (LIPITOR) 80 MG tablet TAKE 1 TABLET(80 MG) BY MOUTH DAILY  . bimatoprost (LUMIGAN) 0.01 % SOLN Place 1 drop into the right eye at bedtime.   Marland Kitchen buPROPion (WELLBUTRIN XL) 150 MG 24 hr tablet Take 1 tablet (150 mg total) by mouth daily.  . furosemide (LASIX) 80 MG tablet Take 1 tablet (80 mg total) by mouth 2 (two) times daily.  Marland Kitchen gabapentin (NEURONTIN) 300 MG capsule Take 300 mg by mouth 2 (two) times daily.   Marland Kitchen HYDROcodone-acetaminophen (NORCO/VICODIN) 5-325 MG tablet Take 1 tablet by mouth every 4 (four) hours as needed for moderate pain.  . Insulin Glargine (BASAGLAR KWIKPEN) 100 UNIT/ML SOPN Inject 0.2 mLs (20 Units total) into the skin daily.  Marland Kitchen levothyroxine (SYNTHROID, LEVOTHROID) 125 MCG tablet Take 125 mcg by mouth daily before breakfast.  . metoprolol succinate (TOPROL XL) 25 MG 24 hr tablet Take 0.5 tablets (12.5 mg total) by mouth at bedtime.  . nitroGLYCERIN (NITROSTAT) 0.4 MG SL tablet Place 1 tablet (0.4 mg total) under the tongue every 5 (five) minutes as needed for chest pain.  . potassium chloride SA (K-DUR,KLOR-CON) 20 MEQ tablet Take 2 tablets (40 mEq total) by mouth 2 (two) times daily.  Marland Kitchen spironolactone (ALDACTONE) 25 MG tablet Take 0.5 tablets (12.5 mg total) by mouth daily.     Allergies:   Other; Adhesive [tape]; Aspirin; Latex; and Nsaids   Social History   Socioeconomic History  . Marital status: Married    Spouse name: [name]  . Number of children: 0  . Years of education: Not on file  . Highest education level: Not on file  Occupational History  . Occupation: Retired    Comment: office work  Scientific laboratory technician  . Financial resource strain: Not very hard  . Food insecurity:    Worry: Never true    Inability: Never true  . Transportation needs:    Medical: No    Non-medical: No  Tobacco Use  . Smoking status: Former Smoker    Packs/day: 1.00    Years: 30.00    Pack years: 30.00    Types: Cigarettes    Last  attempt to quit: 02/2017    Years since quitting: 1.5  . Smokeless tobacco: Never Used  . Tobacco comment: started in her [age] until 2009   Substance and Sexual Activity  . Alcohol use: Not Currently    Alcohol/week: 0.0 standard drinks  . Drug use: No  . Sexual activity: Yes  Lifestyle  . Physical activity:    Days per week: 0 days    Minutes per session: 0 min  . Stress: Only a little  Relationships  . Social connections:    Talks on phone: More than three times a week    Gets together:  More than three times a week    Attends religious service: More than 4 times per year    Active member of club or organization: Yes    Attends meetings of clubs or organizations: More than 4 times per year    Relationship status: Married  Other Topics Concern  . Not on file  Social History Narrative   Married to husband, [name]   No children   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   Recently moved back here from [place]      Family History: The patient's family history includes Bladder Cancer in her brother and cousin; Breast cancer in her maternal aunt, mother, and sister; Clotting disorder in her sister; Colon polyps in her brother and brother; Diabetes in her maternal grandmother, mother, and sister; Heart attack in her brother, father, and sister; Heart disease in her father; Pancreatic cancer in her brother; Prostate cancer in her brother.  ROS:   Please see the history of present illness.    Cough, still smoking, peripheral edema, difficulty with balance, easy bruising, chronic back pain.  All other systems reviewed and are negative.  EKGs/Labs/Other Studies Reviewed:    The following studies were reviewed today: Echocardiography performed September 2019: Study Conclusions  - Left ventricle: Systolic function was normal. The estimated   ejection fraction was in the range of 55% to 60%. There is   hypokinesis of the apical septal  myocardium.  Impressions:  - Limited study with definity to evaluate wall motion; akinesis of   the distal septum with overall preserved LV function.  Coronary angiography performed May 29, 2018:   When compared to prior coronary angiography, no significant change has occurred.  Proximal/ostial 25% left main.  Calcified proximal to mid LAD with 20 to 30% narrowing.  Distal to apical LAD diffusely involved with up to 50 to 70% narrowing.  Apical LAD actually appears improved compared to prior.  Circumflex is patent with irregularities noted in the circumflex and obtuse marginal branches up to 40 to 50%.  Diffuse luminal irregularities throughout the right coronary up to 40%.  Right coronary is dominant.  Dyssynergy of LV contractility pattern likely related to RV pacing.  There may be focal apical akinesis.  EF is 50%.  LVEDP is normal.  RECOMMENDATIONS:   Primary risk prevention with aggressive lipid-lowering, hemoglobin A1c less than 7, weight loss, evaluation and treatment of probable sleep apnea, blood pressure target less than or equal to 130/80 mmHg, and moderate intensity aerobic activity greater than 150 minutes/week     EKG:  EKG performed on 06/20/2018 and personally reviewed today revealed AV synchrony with atrial sensing and ventricular pacing.  No change when compared to historical tracings done earlier in 2019.  Recent Labs: 03/21/2018: TSH 2.22 05/23/2018: B Natriuretic Peptide 196.3 06/06/2018: ALT 11 06/20/2018: NT-Pro BNP 836 07/13/2018: Hemoglobin 11.7; Platelets 422 08/17/2018: BUN 25; Creatinine, Ser 1.28; Potassium 4.5; Sodium 135  Recent Lipid Panel    Component Value Date/Time   CHOL 124 05/25/2018 0531   TRIG 81 05/25/2018 0531   HDL 35 (L) 05/25/2018 0531   CHOLHDL 3.5 05/25/2018 0531   VLDL 16 05/25/2018 0531   LDLCALC 73 05/25/2018 0531   LDLDIRECT 79.0 03/23/2016 1529    Physical Exam:    VS:  BP 118/78   Pulse 71   Ht 5\' 7"   (1.702 m)   Wt 252 lb 9.6 oz (114.6 kg)   SpO2 95%   BMI 39.56  kg/m     Wt Readings from Last 3 Encounters:  09/25/18 252 lb 9.6 oz (114.6 kg)  09/05/18 258 lb (117 kg)  08/03/18 255 lb 1.9 oz (115.7 kg)     GEN: Obese.  Appears older than stated age.. No acute distress HEENT: Normal NECK: No JVD. LYMPHATICS: No lymphadenopathy CARDIAC: RRR.  No murmur, no gallop, trace bilateral lower extremity edema VASCULAR: 2+ bilateral radial and carotid pulses, no carotid bruits RESPIRATORY:  Clear to auscultation without rales, wheezing or rhonchi  ABDOMEN: Soft, non-tender, non-distended, No pulsatile mass, MUSCULOSKELETAL: No deformity  SKIN: Warm and dry NEUROLOGIC:  Alert and oriented x 3 PSYCHIATRIC:  Normal affect   ASSESSMENT:    1. Chronic heart failure with preserved ejection fraction (HCC)   2. Paroxysmal atrial fibrillation ([place])   3. Chronic obstructive pulmonary disease, unspecified COPD type ([place])   4. Tobacco abuse   5. Essential hypertension   6. Cardiac pacemaker in situ - St Jude   7. CAD in native artery   8. Chronic anticoagulation    PLAN:    In order of problems listed above:  1. Volume status is significantly improved.  The identification of sleep apnea will help with control of recurrent heart failure.  In setting of diabetes we will also investigate/consider SGLT2 therapy. 2. Currently without recurrent atrial fibrillation.  Control of sleep apnea will be helpful. 3. Strongly encouraged smoking cessation. 4. Tobacco use is devastating in her situation.  We have a long conversation.  She aims to discontinue on February 1.  She is doing this in concert with her husband. 5. Target blood pressure 130/80 mmHg or less. 6. Pacer function appears normal. 7. Nonobstructive coronary disease without ischemic symptoms. 8. Continue apixaban.  Monitor for bleeding.  Clinical follow-up with team member in 6 months.  Me in 1 year.  SGLT2 therapy will enhance  cardioprotection.  Smoking cessation is huge.  Greater than 50% of the time during this office visit was spent in education, counseling, and coordination of care related to underlying disease process and testing as outlined.     Medication Adjustments/Labs and Tests Ordered: Current medicines are reviewed at length with the patient today.  Concerns regarding medicines are outlined above.  No orders of the defined types were placed in this encounter.  No orders of the defined types were placed in this encounter.   Patient Instructions  Medication Instructions:  Your physician recommends that you continue on your current medications as directed. Please refer to the Current Medication list given to you today.  If you need a refill on your cardiac medications before your next appointment, please call your pharmacy.   Lab work: None ordered If you have labs (blood work) drawn today and your tests are completely normal, you will receive your results only by: Marland Kitchen MyChart Message (if you have MyChart) OR . A paper copy in the mail If you have any lab test that is abnormal or we need to change your treatment, we will call you to review the results.  Testing/Procedures: None ordered  Follow-Up: At [facility], you and your health needs are our priority.  As part of our continuing mission to provide you with exceptional heart care, we have created designated Provider Care Teams.  These Care Teams include your primary Cardiologist (physician) and Advanced Practice Providers (APPs -  Physician Assistants and Nurse Practitioners) who all work together to provide you with the care you need, when you need it. . You  will need a follow up appointment in 6 months.  Please call our office 2 months in advance to schedule this appointment.  You may see Sinclair Grooms, MD or one of the following Advanced Practice Providers on your designated Care Team:   . Truitt Merle, NP . Cecilie Kicks,  NP . Kathyrn Drown, NP   Any Other Special Instructions Will Be Listed Below (If Applicable).     Signed, Sinclair Grooms, MD  09/25/2018 3:13 PM    [place]

## 2018-09-25 ENCOUNTER — Ambulatory Visit (INDEPENDENT_AMBULATORY_CARE_PROVIDER_SITE_OTHER): Payer: Medicare Other | Admitting: Interventional Cardiology

## 2018-09-25 ENCOUNTER — Telehealth: Payer: Self-pay | Admitting: Pharmacist

## 2018-09-25 ENCOUNTER — Encounter: Payer: Self-pay | Admitting: Interventional Cardiology

## 2018-09-25 VITALS — BP 118/78 | HR 71 | Ht 67.0 in | Wt 252.6 lb

## 2018-09-25 DIAGNOSIS — J449 Chronic obstructive pulmonary disease, unspecified: Secondary | ICD-10-CM

## 2018-09-25 DIAGNOSIS — I2510 Atherosclerotic heart disease of native coronary artery without angina pectoris: Secondary | ICD-10-CM

## 2018-09-25 DIAGNOSIS — Z7901 Long term (current) use of anticoagulants: Secondary | ICD-10-CM | POA: Diagnosis not present

## 2018-09-25 DIAGNOSIS — I480 Paroxysmal atrial fibrillation: Secondary | ICD-10-CM

## 2018-09-25 DIAGNOSIS — Z950 Presence of cardiac pacemaker: Secondary | ICD-10-CM

## 2018-09-25 DIAGNOSIS — I10 Essential (primary) hypertension: Secondary | ICD-10-CM

## 2018-09-25 DIAGNOSIS — I5032 Chronic diastolic (congestive) heart failure: Secondary | ICD-10-CM

## 2018-09-25 DIAGNOSIS — Z720 Tobacco use: Secondary | ICD-10-CM | POA: Diagnosis not present

## 2018-09-25 MED ORDER — SEMAGLUTIDE(0.25 OR 0.5MG/DOS) 2 MG/1.5ML ~~LOC~~ SOPN: 0.2500 mg | PEN_INJECTOR | SUBCUTANEOUS | 11 refills | Status: DC

## 2018-09-25 NOTE — Telephone Encounter (Addendum)
Discussed CV protective DM medications with patient. EGFR is < 45 so will avoid SGLT2i use. Pt would benefit from addition of GLP1-RA therapy. Will start once weekly Ozempic 0.53m for CV benefit and weight loss. Activated copay card for pt in clinic for $25 monthly copay. Pt previously took metformin, however her PCP advised her to discontinue therapy ~2 years ago due to reduced renal function. Hgb A1c 9.9% in September 2019. Scheduled follow up in pharmacy clinic in 4 weeks to further titrate dose if needed. Pt will continue on insulin glargine (averaging ~60 units daily). Advised pt to call clinic prior to follow up visit with any hypoglycemia, may be able to decrease insulin dose.

## 2018-09-25 NOTE — Patient Instructions (Signed)
Medication Instructions:  Your physician recommends that you continue on your current medications as directed. Please refer to the Current Medication list given to you today.  If you need a refill on your cardiac medications before your next appointment, please call your pharmacy.   Lab work: None ordered  If you have labs (blood work) drawn today and your tests are completely normal, you will receive your results only by: . MyChart Message (if you have MyChart) OR . A paper copy in the mail If you have any lab test that is abnormal or we need to change your treatment, we will call you to review the results.  Testing/Procedures: None ordered  Follow-Up: At [facility], you and your health needs are our priority.  As part of our continuing mission to provide you with exceptional heart care, we have created designated Provider Care Teams.  These Care Teams include your primary Cardiologist (physician) and Advanced Practice Providers (APPs -  Physician Assistants and Nurse Practitioners) who all work together to provide you with the care you need, when you need it. You will need a follow up appointment in 6 months.  Please call our office 2 months in advance to schedule this appointment.  You may see Henry W Smith III, MD or one of the following Advanced Practice Providers on your designated Care Team:   Lori Gerhardt, NP Laura Ingold, NP . Jill McDaniel, NP  Any Other Special Instructions Will Be Listed Below (If Applicable).    

## 2018-09-26 NOTE — Telephone Encounter (Signed)
Okay 

## 2018-09-26 NOTE — Addendum Note (Signed)
Addended by: SUPPLE, MEGAN E on: 09/26/2018 02:06 PM   Modules accepted: Orders

## 2018-09-26 NOTE — Telephone Encounter (Signed)
Pt called clinic to report that her Ozempic would cost $270 per month even with the copay card. She is also concerned that she has had a few nodules on her thyroid that have been biopsied, as well as a goiter. GLP1-RA are contraindicated in patients with a history of medullary thyroid cancer or MEN syndrome type 2. Discussed with pt that she would still be able to start a different GLP1-RA, however she does not wish to do this due to concern over her thyroid as well as family history of many cancers (no hx of thyroid cancer though)  She will not qualify for SGLT2i therapy either unfortunately since her eGFR is < 73m/min. Will forward message to Dr STamala Julianas an FJuluis Rainier

## 2018-10-24 ENCOUNTER — Ambulatory Visit: Payer: Medicare Other

## 2018-11-10 ENCOUNTER — Other Ambulatory Visit: Payer: Self-pay | Admitting: Internal Medicine

## 2018-11-10 NOTE — Telephone Encounter (Signed)
Age 78, weight 114.6kg, SCr 1.28 on 08/17/18 in Epic. Last OV 2 months ago with Dr [name] on 09/25/18.

## 2018-12-18 ENCOUNTER — Ambulatory Visit (INDEPENDENT_AMBULATORY_CARE_PROVIDER_SITE_OTHER): Payer: Medicare Other | Admitting: *Deleted

## 2018-12-18 ENCOUNTER — Other Ambulatory Visit: Payer: Self-pay

## 2018-12-18 DIAGNOSIS — I442 Atrioventricular block, complete: Secondary | ICD-10-CM

## 2018-12-18 LAB — CUP PACEART REMOTE DEVICE CHECK
Battery Remaining Longevity: 88 mo
Battery Remaining Percentage: 95.5 %
Battery Voltage: 2.96 V
Brady Statistic AP VP Percent: 74 %
Brady Statistic AP VS Percent: 1 %
Brady Statistic AS VP Percent: 26 %
Brady Statistic AS VS Percent: 1 %
Brady Statistic RA Percent Paced: 74 %
Brady Statistic RV Percent Paced: 99 %
Date Time Interrogation Session: 20200420112127
Implantable Lead Implant Date: 20150506
Implantable Lead Implant Date: 20150506
Implantable Lead Location: 753859
Implantable Lead Location: 753860
Implantable Pulse Generator Implant Date: 20150506
Lead Channel Impedance Value: 380 Ohm
Lead Channel Impedance Value: 430 Ohm
Lead Channel Pacing Threshold Amplitude: 0.75 V
Lead Channel Pacing Threshold Amplitude: 0.75 V
Lead Channel Pacing Threshold Pulse Width: 0.4 ms
Lead Channel Pacing Threshold Pulse Width: 0.4 ms
Lead Channel Sensing Intrinsic Amplitude: 0.8 mV
Lead Channel Sensing Intrinsic Amplitude: 12 mV
Lead Channel Setting Pacing Amplitude: 2.5 V
Lead Channel Setting Pacing Amplitude: 2.5 V
Lead Channel Setting Pacing Pulse Width: 0.4 ms
Lead Channel Setting Sensing Sensitivity: 4 mV
Pulse Gen Model: 2240
Pulse Gen Serial Number: 3013730

## 2018-12-26 ENCOUNTER — Encounter: Payer: Self-pay | Admitting: Cardiology

## 2018-12-26 NOTE — Progress Notes (Signed)
Remote pacemaker transmission.   

## 2019-01-29 ENCOUNTER — Telehealth: Payer: Self-pay

## 2019-01-29 NOTE — Telephone Encounter (Signed)
LVM for patient to call back and reschedule her appointment as Dr. Sharlet Salina will not be in the office the afternoon she is scheduled. Patient can either schedule for an earlier time Tuesday the 9th or can schedule for a later day in the week Dr. Sharlet Salina will be in office on Thursday and Friday the 11th and 12th.

## 2019-02-06 ENCOUNTER — Telehealth: Payer: Self-pay

## 2019-02-06 ENCOUNTER — Ambulatory Visit: Payer: Medicare Other | Admitting: Internal Medicine

## 2019-02-06 NOTE — Telephone Encounter (Signed)
LVM for patient to call back and see if patient can come in at 2 instead of 220 to keep space between our in office appointment and to also go over screening and check in process

## 2019-02-08 ENCOUNTER — Ambulatory Visit (INDEPENDENT_AMBULATORY_CARE_PROVIDER_SITE_OTHER): Payer: Medicare Other | Admitting: Internal Medicine

## 2019-02-08 ENCOUNTER — Other Ambulatory Visit: Payer: Self-pay

## 2019-02-08 ENCOUNTER — Encounter: Payer: Self-pay | Admitting: Internal Medicine

## 2019-02-08 ENCOUNTER — Other Ambulatory Visit (INDEPENDENT_AMBULATORY_CARE_PROVIDER_SITE_OTHER): Payer: Medicare Other

## 2019-02-08 VITALS — BP 100/70 | HR 75 | Temp 98.4°F | Ht 67.0 in | Wt 243.0 lb

## 2019-02-08 DIAGNOSIS — E118 Type 2 diabetes mellitus with unspecified complications: Secondary | ICD-10-CM

## 2019-02-08 DIAGNOSIS — M159 Polyosteoarthritis, unspecified: Secondary | ICD-10-CM

## 2019-02-08 DIAGNOSIS — M150 Primary generalized (osteo)arthritis: Secondary | ICD-10-CM | POA: Diagnosis not present

## 2019-02-08 DIAGNOSIS — C189 Malignant neoplasm of colon, unspecified: Secondary | ICD-10-CM

## 2019-02-08 DIAGNOSIS — I2510 Atherosclerotic heart disease of native coronary artery without angina pectoris: Secondary | ICD-10-CM | POA: Diagnosis not present

## 2019-02-08 DIAGNOSIS — I5043 Acute on chronic combined systolic (congestive) and diastolic (congestive) heart failure: Secondary | ICD-10-CM | POA: Diagnosis not present

## 2019-02-08 DIAGNOSIS — M8949 Other hypertrophic osteoarthropathy, multiple sites: Secondary | ICD-10-CM

## 2019-02-08 LAB — CBC
HCT: 35.3 % — ABNORMAL LOW (ref 36.0–46.0)
Hemoglobin: 11.6 g/dL — ABNORMAL LOW (ref 12.0–15.0)
MCHC: 33 g/dL (ref 30.0–36.0)
MCV: 82.9 fl (ref 78.0–100.0)
Platelets: 415 10*3/uL — ABNORMAL HIGH (ref 150.0–400.0)
RBC: 4.26 Mil/uL (ref 3.87–5.11)
RDW: 15.8 % — ABNORMAL HIGH (ref 11.5–15.5)
WBC: 10 10*3/uL (ref 4.0–10.5)

## 2019-02-08 LAB — COMPREHENSIVE METABOLIC PANEL
ALT: 10 U/L (ref 0–35)
AST: 13 U/L (ref 0–37)
Albumin: 3.7 g/dL (ref 3.5–5.2)
Alkaline Phosphatase: 118 U/L — ABNORMAL HIGH (ref 39–117)
BUN: 17 mg/dL (ref 6–23)
CO2: 29 mEq/L (ref 19–32)
Calcium: 9.1 mg/dL (ref 8.4–10.5)
Chloride: 92 mEq/L — ABNORMAL LOW (ref 96–112)
Creatinine, Ser: 1.13 mg/dL (ref 0.40–1.20)
GFR: 46.67 mL/min — ABNORMAL LOW (ref >60.00)
Glucose, Bld: 304 mg/dL — ABNORMAL HIGH (ref 70–99)
Potassium: 4.1 mEq/L (ref 3.5–5.1)
Sodium: 132 mEq/L — ABNORMAL LOW (ref 135–145)
Total Bilirubin: 0.4 mg/dL (ref 0.2–1.2)
Total Protein: 7.1 g/dL (ref 6.0–8.3)

## 2019-02-08 LAB — HEMOGLOBIN A1C: Hgb A1c MFr Bld: 11.7 % — ABNORMAL HIGH (ref 4.6–6.5)

## 2019-02-08 LAB — BRAIN NATRIURETIC PEPTIDE: Pro B Natriuretic peptide (BNP): 166 pg/mL — ABNORMAL HIGH (ref 0.0–100.0)

## 2019-02-08 MED ORDER — HYDROCODONE-ACETAMINOPHEN 5-325 MG PO TABS: 1.0000 | ORAL_TABLET | ORAL | 0 refills | Status: DC | PRN

## 2019-02-08 NOTE — Patient Instructions (Addendum)
We will have you increase to 5 pills lasix daily for 1 week.   Let us know Monday how you are doing.   We have sent in the hydrocodone prescription for you.

## 2019-02-08 NOTE — Progress Notes (Signed)
   Subjective:   Patient ID: Mary [name], female    DOB: 07/25/1941, 78 y.o.   MRN: [id number]  HPI The patient is a 78 YO female coming in for concerns about weight gain in fluid (more swelling in stomach and legs, has been taking lasix as prescribed and increased from 2 pills daily to 3 pills daily, is urinating afterwards but not as much as usual, denies change in diet or exercise, more SOB now and mild cough dry, denies fevers or chills). She is also having more arthritis pain and needs refill of hydrocodone (uses rarely, with the increasing fluid has needed more for pain in her legs, denies overuse, last filled in January), and diabetes follow up (needs labs done, has not had monitoring in awhile, diet same but she admits could be better, taking basaglar only right now, denies low sugars, does have some neuropathy but stable).   Review of Systems  Constitutional: Positive for activity change, appetite change, fatigue and unexpected weight change.  HENT: Negative.   Eyes: Negative.   Respiratory: Positive for cough and shortness of breath. Negative for chest tightness.   Cardiovascular: Positive for leg swelling. Negative for chest pain and palpitations.  Gastrointestinal: Negative for abdominal distention, abdominal pain, constipation, diarrhea, nausea and vomiting.  Musculoskeletal: Positive for arthralgias, back pain and gait problem.  Skin: Negative.   Neurological: Negative for dizziness, syncope, weakness and light-headedness.  Psychiatric/Behavioral: Negative.     Objective:  Physical Exam Constitutional:      Appearance: She is well-developed. She is obese.     Comments: Chronically ill appearing  HENT:     Head: Normocephalic and atraumatic.  Neck:     Musculoskeletal: Normal range of motion.  Cardiovascular:     Rate and Rhythm: Normal rate and regular rhythm.  Pulmonary:     Effort: Pulmonary effort is normal. No respiratory distress.     Breath sounds: Wheezing and  rales present.     Comments: Some wheezing mild and some rales in the bases Abdominal:     General: Bowel sounds are normal. There is distension.     Palpations: Abdomen is soft.     Tenderness: There is no abdominal tenderness. There is no rebound.  Musculoskeletal:     Right lower leg: Edema present.     Left lower leg: Edema present.     Comments: 2+ edema to mid thighs bilateral  Skin:    General: Skin is warm and dry.  Neurological:     Mental Status: She is alert and oriented to person, place, and time.     Coordination: Coordination normal.     Vitals:   02/08/19 1417  BP: 100/70  Pulse: 75  Temp: 98.4 F (36.9 C)  TempSrc: Oral  SpO2: 96%  Weight: 243 lb (110.2 kg)  Height: 5\' 7"  (1.702 m)    Assessment & Plan:

## 2019-02-09 LAB — CEA: CEA: 2.1 ng/mL

## 2019-02-09 NOTE — Assessment & Plan Note (Signed)
Needs CEA for monitoring.

## 2019-02-09 NOTE — Assessment & Plan Note (Signed)
Will treat for flare with 5 pills lasix 80 mg daily and call back in 2-3 days with update. If worsening SOB or lack of improvement may need to seek care in the ER. Checking CXR and BNP and CMP today. Adjust as needed.

## 2019-02-09 NOTE — Assessment & Plan Note (Signed)
Refill hydrocodone which she uses rarely. [place] narcotic database reviewed and no inappropriate fills.

## 2019-02-09 NOTE — Assessment & Plan Note (Signed)
Needs HgA1c today and adjust basaglar as needed. No low sugars. Has neuropathy which is stable but on gabapentin. She is on statin.

## 2019-02-12 ENCOUNTER — Ambulatory Visit (INDEPENDENT_AMBULATORY_CARE_PROVIDER_SITE_OTHER)
Admission: RE | Admit: 2019-02-12 | Discharge: 2019-02-12 | Disposition: A | Discharge status: Home / Self Care | Payer: Medicare Other | Source: Ambulatory Visit | Attending: Internal Medicine | Admitting: Internal Medicine

## 2019-02-12 ENCOUNTER — Other Ambulatory Visit: Payer: Self-pay

## 2019-02-12 ENCOUNTER — Encounter: Payer: Self-pay | Admitting: Internal Medicine

## 2019-02-12 ENCOUNTER — Telehealth: Payer: Self-pay

## 2019-02-12 DIAGNOSIS — I5043 Acute on chronic combined systolic (congestive) and diastolic (congestive) heart failure: Secondary | ICD-10-CM

## 2019-02-12 DIAGNOSIS — R0602 Shortness of breath: Secondary | ICD-10-CM | POA: Diagnosis not present

## 2019-02-12 NOTE — Telephone Encounter (Signed)
fyi

## 2019-02-12 NOTE — Telephone Encounter (Signed)
Copied from [place]. Topic: General - Other >> Feb 12, 2019  8:14 AM Mary [name] wrote:  Pt wanted Dr Sharlet Salina to know that she still swelling and will come in today to have the Xray

## 2019-02-14 ENCOUNTER — Emergency Department (HOSPITAL_COMMUNITY): Payer: Medicare Other

## 2019-02-14 ENCOUNTER — Telehealth: Payer: Self-pay | Admitting: Interventional Cardiology

## 2019-02-14 ENCOUNTER — Emergency Department (HOSPITAL_COMMUNITY)
Admission: EM | Admit: 2019-02-14 | Discharge: 2019-02-14 | Disposition: A | Discharge status: Home / Self Care | Payer: Medicare Other | Attending: Emergency Medicine | Admitting: Emergency Medicine

## 2019-02-14 ENCOUNTER — Other Ambulatory Visit: Payer: Self-pay

## 2019-02-14 DIAGNOSIS — Z794 Long term (current) use of insulin: Secondary | ICD-10-CM | POA: Diagnosis not present

## 2019-02-14 DIAGNOSIS — Z9884 Bariatric surgery status: Secondary | ICD-10-CM | POA: Insufficient documentation

## 2019-02-14 DIAGNOSIS — Z91040 Latex allergy status: Secondary | ICD-10-CM | POA: Diagnosis not present

## 2019-02-14 DIAGNOSIS — Z950 Presence of cardiac pacemaker: Secondary | ICD-10-CM | POA: Diagnosis not present

## 2019-02-14 DIAGNOSIS — I110 Hypertensive heart disease with heart failure: Secondary | ICD-10-CM | POA: Insufficient documentation

## 2019-02-14 DIAGNOSIS — Z87891 Personal history of nicotine dependence: Secondary | ICD-10-CM | POA: Insufficient documentation

## 2019-02-14 DIAGNOSIS — R600 Localized edema: Secondary | ICD-10-CM | POA: Insufficient documentation

## 2019-02-14 DIAGNOSIS — I5042 Chronic combined systolic (congestive) and diastolic (congestive) heart failure: Secondary | ICD-10-CM | POA: Insufficient documentation

## 2019-02-14 DIAGNOSIS — R2243 Localized swelling, mass and lump, lower limb, bilateral: Secondary | ICD-10-CM | POA: Diagnosis present

## 2019-02-14 DIAGNOSIS — I517 Cardiomegaly: Secondary | ICD-10-CM | POA: Diagnosis not present

## 2019-02-14 DIAGNOSIS — E119 Type 2 diabetes mellitus without complications: Secondary | ICD-10-CM | POA: Diagnosis not present

## 2019-02-14 DIAGNOSIS — I2510 Atherosclerotic heart disease of native coronary artery without angina pectoris: Secondary | ICD-10-CM | POA: Insufficient documentation

## 2019-02-14 DIAGNOSIS — M7989 Other specified soft tissue disorders: Secondary | ICD-10-CM | POA: Diagnosis not present

## 2019-02-14 DIAGNOSIS — Z85038 Personal history of other malignant neoplasm of large intestine: Secondary | ICD-10-CM | POA: Insufficient documentation

## 2019-02-14 DIAGNOSIS — J449 Chronic obstructive pulmonary disease, unspecified: Secondary | ICD-10-CM | POA: Insufficient documentation

## 2019-02-14 DIAGNOSIS — R609 Edema, unspecified: Secondary | ICD-10-CM

## 2019-02-14 DIAGNOSIS — E039 Hypothyroidism, unspecified: Secondary | ICD-10-CM | POA: Diagnosis not present

## 2019-02-14 LAB — CBC WITH DIFFERENTIAL/PLATELET
Abs Immature Granulocytes: 0.04 10*3/uL (ref 0.00–0.07)
Basophils Absolute: 0.1 10*3/uL (ref 0.0–0.1)
Basophils Relative: 1 %
Eosinophils Absolute: 0.2 10*3/uL (ref 0.0–0.5)
Eosinophils Relative: 2 %
HCT: 35.5 % — ABNORMAL LOW (ref 36.0–46.0)
Hemoglobin: 11 g/dL — ABNORMAL LOW (ref 12.0–15.0)
Immature Granulocytes: 0 %
Lymphocytes Relative: 28 %
Lymphs Abs: 2.6 10*3/uL (ref 0.7–4.0)
MCH: 26.4 pg (ref 26.0–34.0)
MCHC: 31 g/dL (ref 30.0–36.0)
MCV: 85.3 fL (ref 80.0–100.0)
Monocytes Absolute: 0.8 10*3/uL (ref 0.1–1.0)
Monocytes Relative: 9 %
Neutro Abs: 5.6 10*3/uL (ref 1.7–7.7)
Neutrophils Relative %: 60 %
Platelets: 358 10*3/uL (ref 150–400)
RBC: 4.16 MIL/uL (ref 3.87–5.11)
RDW: 15.3 % (ref 11.5–15.5)
WBC: 9.3 10*3/uL (ref 4.0–10.5)
nRBC: 0 % (ref 0.0–0.2)

## 2019-02-14 LAB — BASIC METABOLIC PANEL
Anion gap: 10 (ref 5–15)
BUN: 12 mg/dL (ref 8–23)
CO2: 27 mmol/L (ref 22–32)
Calcium: 9.2 mg/dL (ref 8.9–10.3)
Chloride: 98 mmol/L (ref 98–111)
Creatinine, Ser: 1.02 mg/dL — ABNORMAL HIGH (ref 0.44–1.00)
GFR calc Af Amer: >60 mL/min (ref >60)
GFR calc non Af Amer: 53 mL/min — ABNORMAL LOW (ref >60)
Glucose, Bld: 91 mg/dL (ref 70–99)
Potassium: 3.8 mmol/L (ref 3.5–5.1)
Sodium: 135 mmol/L (ref 135–145)

## 2019-02-14 LAB — BRAIN NATRIURETIC PEPTIDE: B Natriuretic Peptide: 84.2 pg/mL (ref 0.0–100.0)

## 2019-02-14 MED ORDER — FUROSEMIDE 10 MG/ML IJ SOLN
80.0000 mg | Freq: Once | INTRAMUSCULAR | Status: AC
  Administered 2019-02-14: 40 mg via INTRAVENOUS
  Filled 2019-02-14: qty 8

## 2019-02-14 NOTE — ED Triage Notes (Signed)
Pt c/o bilateral leg swelling x 1 week ; pt states " this happens when im in heart failure "  She further states that she has been taking 5 lasix pills/ day per her pcp ; pt denies any chest pain but report slight sob

## 2019-02-14 NOTE — ED Provider Notes (Signed)
[facility] EMERGENCY DEPARTMENT Provider Note   CSN: [id number] Arrival date & time: 02/14/19  1335     History   Chief Complaint Chief Complaint  Patient presents with  . Leg Swelling    HPI Mary [name] is a 78 y.o. female.     The history is provided by the patient and medical records. No language interpreter was used.     78 year old female with history of CHF with EF 55-60%, CAD, COPD, diabetes presenting with concerns of leg swelling.  Patient report for nearly 2 weeks she has noticed increasing swelling to both lower legs.  She report tightness to her legs but does not complain of any fever chills chest pain worsening shortness of breath or worsening cough.  She was seen and evaluated by her primary care doctor 6 days ago for her complaint.  She had a chest x-ray performed without signs of pneumonia.  She was told to increase her Lasix from 2 pills to 5 pills daily in which she has been compliant.  She has monitor her water intake very carefully and denies any other medication changes or dietary changes.  She did not notice any significant improvement in her leg swelling and yesterday her PCP recommend patient to come to ER for further care.  She mention in the past when she has a CHF exacerbation like this she was admitted to the hospital for several days.  Patient denies any recent sick contact and no recent fever or productive cough.  No prior history of DVT.  She does have history of COPD and does endorse some wheezing.  Past Medical History:  Diagnosis Date  . Arthritis   . Atrial tachycardia ([place]) 04/23/2017  . CAD (coronary artery disease)    non obstructive  . 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 Procedure complicated by an atrial lead dislodgment and repeat procedure, 12/2013   . Carotid stenosis    Carotid US 5/16:  Bilateral ICA 1-39%; L vertebral retrograde; L BP 126/49, R BP 140/57   . Chronic combined systolic and diastolic CHF (congestive heart failure) ([place])   . Chronic lower back pain   . Colon cancer ([place])    a. s/p chemo  . Colon polyps   . Diabetic peripheral neuropathy ([place])   . DVT (deep venous thrombosis) ([employer]) 2013   "twice behind knee on left side" (06/25/2013)  . Glaucoma   . Hyperlipidemia   . Hypertension   . Hypothyroidism   . Iron deficiency anemia   . Multinodular goiter   . Osteoporosis   . PAF (paroxysmal atrial fibrillation) ([place])   . Sleep apnea    a. resolved post weight loss   . Type II diabetes mellitus [facility])     Patient Active Problem List   Diagnosis Date Noted  . OSA (obstructive sleep apnea) 06/07/2018  . COPD (chronic obstructive pulmonary disease) ([place]) 05/24/2018  . Acute on chronic combined systolic and diastolic CHF (congestive heart failure) ([place]) 05/24/2018  . Acute respiratory failure with hypoxia ([place]) 05/21/2018  . Osteoarthritis 05/25/2017  . Atrial tachycardia ([place]) 04/23/2017  . Second degree AV block, Mobitz type II   . Depression 08/02/2016  . Chronic diastolic heart failure ([place]) 08/02/2016  . Tobacco abuse 08/02/2016  . Right lumbar radiculitis 06/09/2016  . Tear of medial meniscus of right knee, current 04/22/2016  . Chronic venous insufficiency 02/21/2015  . 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 Atrioventricular block, complete ([place]) 01/02/2014  . Chronic anticoagulation 10/09/2013  . PAF (paroxysmal atrial fibrillation) ([place]) 08/29/2013  . Obesity 05/05/2013  . CAD in native artery   . Diabetes mellitus with complication ([place])   . Hypothyroidism   . Hypertension   . Colon cancer ([place])   . Hyperlipidemia     Past Surgical History:  Procedure Laterality Date  . CARDIAC CATHETERIZATION  2006   Never had PCI, 3 caths total. Last one in [place]  . CARDIAC CATHETERIZATION N/A 02/04/2015   Procedure: Left Heart Cath and Coronary Angiography;  Surgeon:  Belva Crome, MD;  Location: [facility] CV LAB;  Service: Cardiovascular;  Laterality: N/A;  . CATARACT EXTRACTION, BILATERAL Bilateral 2013   "and put stent in my left eye for glaucoma" (06/25/2013)  . CHOLECYSTECTOMY  1980's  . COLECTOMY  10/2010   Tumor removal  . EYE MUSCLE SURGERY Bilateral ~ 1963   "muscles too long; eyes would go out and up; tied muscles to hold my eyes straight" (06/25/2013)  . LEAD REVISION  01-03-2014   atrial lead revision by Dr Rayann Heman  . LEAD REVISION N/A 01/03/2014   Procedure: LEAD REVISION;  Surgeon: Coralyn Mark, MD;  Location: [facility] CATH LAB;  Service: Cardiovascular;  Laterality: N/A;  . LEFT HEART CATH AND CORONARY ANGIOGRAPHY N/A 05/29/2018   Procedure: LEFT HEART CATH AND CORONARY ANGIOGRAPHY;  Surgeon: Belva Crome, MD;  Location: [facility] CV LAB;  Service: Cardiovascular;  Laterality: N/A;  . PACEMAKER INSERTION  01/02/2014   STJ Assurity dual chamber pacemaker implnated by Dr Lovena Le for SSS  . PERMANENT PACEMAKER INSERTION N/A 01/02/2014   Procedure: PERMANENT PACEMAKER INSERTION;  Surgeon: Evans Lance, MD;  Location: [facility] CATH LAB;  Service: Cardiovascular;  Laterality: N/A;  . PORTACATH PLACEMENT Left 11/2010  . ROUX-EN-Y GASTRIC BYPASS  2009  . ULNAR TUNNEL RELEASE Left ~ 2008  . UMBILICAL HERNIA REPAIR  1970's?    (06/25/2013)     OB History   No obstetric history on file.      Home Medications    Prior to Admission medications   Medication Sig Start Date End Date Taking? Authorizing Provider  albuterol (PROVENTIL HFA;VENTOLIN HFA) 108 (90 Base) MCG/ACT inhaler Inhale 2 puffs into the lungs every 6 (six) hours as needed for wheezing or shortness of breath. 05/30/18   Regalado, Belkys A, MD  atorvastatin (LIPITOR) 80 MG tablet TAKE 1 TABLET(80 MG) BY MOUTH DAILY 08/21/18   Hoyt Koch, MD  bimatoprost (LUMIGAN) 0.01 % SOLN Place 1 drop into the right eye at bedtime.     [provider]  buPROPion (WELLBUTRIN XL) 150 MG  24 hr tablet Take 1 tablet (150 mg total) by mouth daily. 05/12/18   Hoyt Koch, MD  ELIQUIS 5 MG TABS tablet TAKE 1 TABLET BY MOUTH TWICE A DAY 11/10/18   Allred, Jeneen Rinks, MD  furosemide (LASIX) 80 MG tablet Take 1 tablet (80 mg total) by mouth 2 (two) times daily. 06/13/18   Hoyt Koch, MD  gabapentin (NEURONTIN) 300 MG capsule Take 300 mg by mouth 2 (two) times daily.     [provider]  HYDROcodone-acetaminophen (NORCO/VICODIN) 5-325 MG tablet Take 1 tablet by mouth every 4 (four) hours as needed for moderate pain. 02/08/19   Hoyt Koch, MD  Insulin Glargine (BASAGLAR KWIKPEN) 100 UNIT/ML SOPN Inject 0.2 mLs (20 Units total) into the skin daily.  05/30/18   Regalado, Belkys A, MD  levothyroxine (SYNTHROID, LEVOTHROID) 125 MCG tablet Take 125 mcg by mouth daily before breakfast. 01/10/18   [provider]  metoprolol succinate (TOPROL XL) 25 MG 24 hr tablet Take 0.5 tablets (12.5 mg total) by mouth at bedtime. 05/10/18   Patsey Berthold, NP  nitroGLYCERIN (NITROSTAT) 0.4 MG SL tablet Place 1 tablet (0.4 mg total) under the tongue every 5 (five) minutes as needed for chest pain. 08/26/17   Belva Crome, MD  potassium chloride SA (K-DUR,KLOR-CON) 20 MEQ tablet Take 2 tablets (40 mEq total) by mouth 2 (two) times daily. 07/21/18   Isaiah Serge, NP  spironolactone (ALDACTONE) 25 MG tablet Take 0.5 tablets (12.5 mg total) by mouth daily. 08/09/18 11/07/18  Isaiah Serge, NP    Family History Family History  Problem Relation Age of Onset  . Breast cancer Mother   . Diabetes Mother   . Heart disease Father   . Heart attack Father   . Heart attack Sister   . Heart attack Brother   . Bladder Cancer Brother   . Prostate cancer Brother   . Colon polyps Brother   . Breast cancer Sister   . Breast cancer Maternal Aunt        x 2 aunts  . Pancreatic cancer Brother   . Colon polyps Brother   . Bladder Cancer Cousin   . Clotting disorder Sister   .  Diabetes Sister        x 3  . Diabetes Maternal Grandmother     Social History Social History   Tobacco Use  . Smoking status: Former Smoker    Packs/day: 1.00    Years: 30.00    Pack years: 30.00    Types: Cigarettes    Quit date: 02/2017    Years since quitting: 1.9  . Smokeless tobacco: Never Used  . Tobacco comment: started in her [age] until 2009   Substance Use Topics  . Alcohol use: Not Currently    Alcohol/week: 0.0 standard drinks  . Drug use: No     Allergies   Other, Adhesive [tape], Aspirin, Latex, and Nsaids   Review of Systems Review of Systems  All other systems reviewed and are negative.    Physical Exam Updated Vital Signs BP (!) 142/51   Pulse 70   Temp 97.9 F (36.6 C) (Oral)   Resp 18   SpO2 95%   Physical Exam Vitals signs and nursing note reviewed.  Constitutional:      General: She is not in acute distress.    Appearance: She is well-developed. She is obese.  HENT:     Head: Atraumatic.  Eyes:     Conjunctiva/sclera: Conjunctivae normal.  Neck:     Musculoskeletal: Neck supple.     Comments: No JVD Cardiovascular:     Rate and Rhythm: Normal rate.     Heart sounds: No friction rub. No gallop.   Pulmonary:     Effort: Pulmonary effort is normal.     Breath sounds: Wheezing and rhonchi present.  Abdominal:     General: Bowel sounds are normal. There is distension.     Tenderness: There is no abdominal tenderness.  Musculoskeletal:        General: Swelling (2+ pitting edema to bilateral lower extremities with intact distal pedal pulses.) present.  Skin:    Findings: No rash.  Neurological:     Mental Status: She is alert and oriented to person,  place, and time.  Psychiatric:        Mood and Affect: Mood normal.      ED Treatments / Results  Labs (all labs ordered are listed, but only abnormal results are displayed) Labs Reviewed  BASIC METABOLIC PANEL - Abnormal; Notable for the following components:      Result  Value   Creatinine, Ser 1.02 (*)    GFR calc non Af Amer 53 (*)    All other components within normal limits  CBC WITH DIFFERENTIAL/PLATELET - Abnormal; Notable for the following components:   Hemoglobin 11.0 (*)    HCT 35.5 (*)    All other components within normal limits  BRAIN NATRIURETIC PEPTIDE    EKG EKG Interpretation  Date/Time:  Wednesday February 14 2019 13:48:38 EDT Ventricular Rate:  71 PR Interval:    QRS Duration: 172 QT Interval:  469 QTC Calculation: 510 R Axis:   -83 Text Interpretation:  Atrial-ventricular dual-paced rhythm No further analysis attempted due to paced rhythm No significant change since last tracing Confirmed by Isla Pence [id number]) on 02/14/2019 2:17:11 PM   Radiology Dg Chest Port 1 View  Result Date: 02/14/2019 CLINICAL DATA:  Bilateral lower extremity swelling EXAM: PORTABLE CHEST 1 VIEW COMPARISON:  02/12/2019 FINDINGS: Unchanged position of left chest wall pacemaker leads. Mild cardiomegaly. No pulmonary edema or consolidation. No pleural effusion or pneumothorax. IMPRESSION: No pulmonary edema. Electronically Signed   By: Ulyses Jarred M.D.   On: 02/14/2019 14:21    Procedures Procedures (including critical care time)  Medications Ordered in ED Medications  furosemide (LASIX) injection 80 mg (40 mg Intravenous Given 02/14/19 1507)     Initial Impression / Assessment and Plan / ED Course  I have reviewed the triage vital signs and the nursing notes.  Pertinent labs & imaging results that were available during my care of the patient were reviewed by me and considered in my medical decision making (see chart for details).        BP 113/79   Pulse 70   Temp 97.9 F (36.6 C) (Oral)   Resp (!) 22   Ht 5\' 7"  (1.702 m)   Wt 110.2 kg   SpO2 97%   BMI 38.06 kg/m    Final Clinical Impressions(s) / ED Diagnoses   Final diagnoses:  Peripheral edema    ED Discharge Orders    None     2:16 PM Patient here with peripheral  edema.  History of CHF.  She is on Lasix.  She has been increasing her Lasix per Dr. states neck swelling has not improved.  She does not have any shortness of breath no fever.  Examination revealed 2+ lateral pitting edema without signs of DVT or cellulitis.  Work-up today is unremarkable.  Normal renal function, normal BNP, chest x-ray with pleural effusion.  Patient received a dose of Lasix via IV and she is stable for discharge.  Recommend continue wearing compression of housing and follow-up with PCP.  Care discussed with Dr. Gilford Raid.    Domenic Moras, PA-C 02/14/19 1542    Isla Pence, MD 02/15/19 1534

## 2019-02-14 NOTE — Telephone Encounter (Signed)
Spoke with husband, DPR on file.  Last week pt started having severe swelling.  Seen PCP on Thurs and diuretics were increased.  Husband states PCP called and said if no improvement, go to ER.  Pt went to ER and husband now calling stating he is concerned because they are sending her home.  He states last time she got this swollen she had to have IV diuresis.  Swelling is in bilateral feet, ankles and calves.  Husband unsure of weight gain.  Scheduled pt to see Cecilie Kicks, NP tomorrow.  Screened for COVID per protocol.  Husband denies any sx or exposure.  Pt appreciative for call.

## 2019-02-14 NOTE — Progress Notes (Signed)
Cardiology Office Note   Date:  02/16/2019   ID:  Mary [name], DOB 04-29-1941, MRN [id number]  PCP:  Hoyt Koch, MD  Cardiologist:  Dr. [name]    Chief Complaint  Patient presents with  . Hospitalization Follow-up  . Leg Swelling      History of Present Illness: Mary [name] is a 78 y.o. female who presents for ER hospitalization follow up for lower ext edema and SOB.    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  Distal to apical LAD diffusely involved with up to 50 to 70% narrowing.  Apical LAD actually appears improved compared to prior. Circumflex is patent with irregularities noted in the circumflex and obtuse marginal branches up to 40 to 50%  Diffuse luminal irregularities throughout the right coronary up to 40%.  Right coronary is dominant, Dyssynergy of LV contractility pattern likely related to RV pacing.  There may be focal apical akinesis.  EF is 50%.  LVEDP is normal  Echo 05/26/18 with EF 55-60% mild AS mild to mod TR PA pk pressure 74 mmHg   She feels great.  She has switched providers.  She now sees Dr. Sharlet Salina.  Was previously followed at [facility] endocrinology.  Last hemoglobin A1c recorded was greater than 11.  Recently diagnosed with sleep apnea and notices dramatic improvement in her energy and sleep pattern.  Lower extremity swelling has improved.  Seen 02/14/19 in ER for lower ext edema after her PCP had increased her po lasix to 80 mg daily - though her pills are 80 mg tabs In ER she had 2+ le edema Was AV paced on monitor she was given lasix IV , recommended continued support stockings and discharged.     BNP was 84 CBC was fairly normal and Cr was 1.02 down from 1.13  CXR was without  edema.  Pacer stable and mild cardiomegaly.    St jude PPM   Today BP lower  After her IV lasix she has put out 1230 per her measurement.  Her husband stated her lasix ws 40 mg tabs,   I called [facility] and she is on 80 mg BID when he went home he called back and confirmed 80 mg tabs.   With low BP no lightheaded or dizzy.  Her edema is improved.  Wt is down 2 lbs in a day.  She is wearing support stockings.  No chest pain and still with some SOB. She still smokes though increased stress with her brother in hospice for cancer. Not eating more salt she is aware that salt will increase her edema.      Past Medical History:  Diagnosis Date  . Arthritis   . Atrial tachycardia ([place]) 04/23/2017  . CAD (coronary artery disease)    non obstructive  . 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 Procedure complicated by an atrial lead dislodgment and repeat procedure, 12/2013   . Carotid stenosis    Carotid US 5/16:  Bilateral ICA 1-39%; L vertebral retrograde; L BP 126/49, R BP 140/57  . Chronic combined systolic and diastolic CHF (congestive heart failure) ([place])   . Chronic lower back pain   . Colon cancer (  [place])    a. s/p chemo  . Colon polyps   . Diabetic peripheral neuropathy ([place])   . DVT (deep venous thrombosis) ([place]) 2013   "twice behind knee on left side" (06/25/2013)  . Glaucoma   . Hyperlipidemia   . Hypertension   . Hypothyroidism   . Iron deficiency anemia   . Multinodular goiter   . Osteoporosis   . PAF (paroxysmal atrial fibrillation) ([place])   . Sleep apnea    a. resolved post weight loss   . Type II diabetes mellitus ([place])     Past Surgical History:  Procedure Laterality Date  . CARDIAC CATHETERIZATION  2006   Never had PCI, 3 caths total. Last one in [place]  . CARDIAC CATHETERIZATION N/A 02/04/2015   Procedure: Left Heart Cath and Coronary Angiography;  Surgeon: Belva Crome, MD;  Location: [place] [facility];   Service: Cardiovascular;  Laterality: N/A;  . CATARACT EXTRACTION, BILATERAL Bilateral 2013   "and put stent in my left eye for glaucoma" (06/25/2013)  . CHOLECYSTECTOMY  1980's  . COLECTOMY  10/2010   Tumor removal  . EYE MUSCLE SURGERY Bilateral ~ 1963   "muscles too long; eyes would go out and up; tied muscles to hold my eyes straight" (06/25/2013)  . LEAD REVISION  01-03-2014   atrial lead revision by Dr Rayann Heman  . LEAD REVISION N/A 01/03/2014   Procedure: LEAD REVISION;  Surgeon: Coralyn Mark, MD;  Location: [place] C[facility];  Service: Cardiovascular;  Laterality: N/A;  . LEFT HEART CATH AND CORONARY ANGIOGRAPHY N/A 05/29/2018   Procedure: LEFT HEART CATH AND CORONARY ANGIOGRAPHY;  Surgeon: Belva Crome, MD;  Location: [place] [facility];  Service: Cardiovascular;  Laterality: N/A;  . PACEMAKER INSERTION  01/02/2014   STJ Assurity dual chamber pacemaker implnated by Dr Lovena Le for SSS  . PERMANENT PACEMAKER INSERTION N/A 01/02/2014   Procedure: PERMANENT PACEMAKER INSERTION;  Surgeon: Evans Lance, MD;  Location: [facility] CATH LAB;  Service: Cardiovascular;  Laterality: N/A;  . PORTACATH PLACEMENT Left 11/2010  . ROUX-EN-Y GASTRIC BYPASS  2009  . ULNAR TUNNEL RELEASE Left ~ 2008  . UMBILICAL HERNIA REPAIR  1970's?    (06/25/2013)     Current Outpatient Medications  Medication Sig Dispense Refill  . albuterol (PROVENTIL HFA;VENTOLIN HFA) 108 (90 Base) MCG/ACT inhaler Inhale 2 puffs into the lungs every 6 (six) hours as needed for wheezing or shortness of breath. 1 Inhaler 2  . atorvastatin (LIPITOR) 80 MG tablet TAKE 1 TABLET(80 MG) BY MOUTH DAILY (Patient taking differently: Take 80 mg by mouth daily. ) 90 tablet 2  . bimatoprost (LUMIGAN) 0.01 % SOLN Place 1 drop into the right eye at bedtime.     Marland Kitchen buPROPion (WELLBUTRIN XL) 150 MG 24 hr tablet Take 1 tablet (150 mg total) by mouth daily. (Patient taking differently: Take 150 mg by mouth every evening. ) 90 tablet 1  . ELIQUIS 5 MG TABS tablet  TAKE 1 TABLET BY MOUTH TWICE A DAY (Patient taking differently: Take 5 mg by mouth 2 (two) times daily. ) 180 tablet 1  . furosemide (LASIX) 80 MG tablet Take 1 tablet (80 mg total) by mouth 2 (two) times daily. 180 tablet 1  . gabapentin (NEURONTIN) 300 MG capsule Take 300 mg by mouth every evening.     Marland Kitchen HYDROcodone-acetaminophen (NORCO/VICODIN) 5-325 MG tablet Take 1 tablet by mouth every 4 (four) hours as needed for moderate pain. 30 tablet 0  . Insulin Glargine (  BASAGLAR KWIKPEN) 100 UNIT/ML SOPN Inject 0.2 mLs (20 Units total) into the skin daily. (Patient taking differently: Inject 20 Units into the skin at bedtime. ) 10 pen 0  . levothyroxine (SYNTHROID, LEVOTHROID) 125 MCG tablet Take 125 mcg by mouth daily before breakfast.  5  . metoprolol succinate (TOPROL XL) 25 MG 24 hr tablet Take 0.5 tablets (12.5 mg total) by mouth at bedtime. 90 tablet 3  . nitroGLYCERIN (NITROSTAT) 0.4 MG SL tablet Place 1 tablet (0.4 mg total) under the tongue every 5 (five) minutes as needed for chest pain. 75 tablet 2  . potassium chloride SA (K-DUR,KLOR-CON) 20 MEQ tablet Take 2 tablets (40 mEq total) by mouth 2 (two) times daily. 120 tablet 5  . spironolactone (ALDACTONE) 25 MG tablet Take 0.5 tablets (12.5 mg total) by mouth daily. 45 tablet 3   No current facility-administered medications for this visit.     Allergies:   Other, Adhesive [tape], Aspirin, Latex, and Nsaids    Social History:  The patient  reports that she quit smoking about 1 years ago. Her smoking use included cigarettes. She has a 30.00 pack-year smoking history. She has never used smokeless tobacco. She reports previous alcohol use. She reports that she does not use drugs.   Family History:  The patient's family history includes Bladder Cancer in her brother and cousin; Breast cancer in her maternal aunt, mother, and sister; Clotting disorder in her sister; Colon polyps in her brother and brother; Diabetes in her maternal grandmother,  mother, and sister; Heart attack in her brother, father, and sister; Heart disease in her father; Pancreatic cancer in her brother; Prostate cancer in her brother.    ROS:  General:no colds or fevers, mild decrease in weight  Skin:no rashes or ulcers HEENT:no blurred vision, no congestion CV:see HPI PUL:see HPI GI:no diarrhea constipation or melena, no indigestion GU:no hematuria, no dysuria MS:no joint pain, no claudication Neuro:no syncope, no lightheadedness Endo:+ diabetes, + thyroid disease  Wt Readings from Last 3 Encounters:  02/15/19 241 lb 6.4 oz (109.5 kg)  02/14/19 243 lb (110.2 kg)  02/08/19 243 lb (110.2 kg)     PHYSICAL EXAM: VS:  BP 90/60   Pulse 96   Ht 5\' 7"  (1.702 m)   Wt 241 lb 6.4 oz (109.5 kg)   SpO2 95%   BMI 37.81 kg/m  , BMI Body mass index is 37.81 kg/m. General:Pleasant affect, NAD Skin:Warm and dry, brisk capillary refill HEENT:normocephalic, sclera clear, mucus membranes moist Neck:supple, no JVD sitting in chair, no bruits  Heart:S1S2 RRR without murmur, gallup, rub or click Lungs:clear without rales, rhonchi, or wheezes ASN:KNLZ, non tender, + BS, do not palpate liver spleen or masses Ext:1-2+ lower ext edema, 2+ pedal pulses, 2+ radial pulses Neuro:alert and oriented X 3, MAE, follows commands, + facial symmetry    EKG:  EKG is NOT ordered today. The ekg from ER AV pacing.    Recent Labs: 03/21/2018: TSH 2.22 02/08/2019: ALT 10; Pro B Natriuretic peptide (BNP) 166.0 02/14/2019: B Natriuretic Peptide 84.2; BUN 12; Creatinine, Ser 1.02; Hemoglobin 11.0; Platelets 358; Potassium 3.8; Sodium 135    Lipid Panel    Component Value Date/Time   CHOL 124 05/25/2018 0531   TRIG 81 05/25/2018 0531   HDL 35 (L) 05/25/2018 0531   CHOLHDL 3.5 05/25/2018 0531   VLDL 16 05/25/2018 0531   LDLCALC 73 05/25/2018 0531   LDLDIRECT 79.0 03/23/2016 1529       Other studies Reviewed: Additional studies/  records that were reviewed today include: .  Echocardiography performed September 2019: Study Conclusions  - Left ventricle: Systolic function was normal. The estimated ejection fraction was in the range of 55% to 60%. There is hypokinesis of the apical septal myocardium.  Impressions:  - Limited study with definity to evaluate wall motion; akinesis of the distal septum with overall preserved LV function.  Coronary angiography performed May 29, 2018:   When compared to prior coronary angiography, no significant change has occurred.  Proximal/ostial 25% left main.  Calcified proximal to mid LAD with 20 to 30% narrowing. Distal to apical LAD diffusely involved with up to 50 to 70% narrowing. Apical LAD actually appears improved compared to prior.  Circumflex is patent with irregularities noted in the circumflex and obtuse marginal branches up to 40 to 50%.  Diffuse luminal irregularities throughout the right coronary up to 40%. Right coronary is dominant.  Dyssynergy of LV contractility pattern likely related to RV pacing. There may be focal apical akinesis. EF is 50%. LVEDP is normal.  RECOMMENDATIONS:   Primary risk prevention with aggressive lipid-lowering, hemoglobin A1c less than 7, weight loss, evaluation and treatment of probable sleep apnea, blood pressure target less than or equal to 130/80 mmHg, and moderate intensity aerobic activity greater than 150 minutes/week      ASSESSMENT AND PLAN:  1.  Acute diastolic HF.  With lower ext edema.  Improving after IV lasix in ER, her husband thought they on gave an IM injection so he was upset it was not IV, when in fact it was.  She diuresed at least 1230 per her measurement.  Down 2 lbs.  She is feeling better.  She is hypotensive so will ask her to only take 40 mg lasix tonight, then back to 80 mg BID,  If fluid returns the would add metolazone. Labs are stable.  Will have her seen next week for follow up  2.    PAF with SR  AV pacing on EKG on  eliquis continue  3. OSA wears her CPAP feels better with this.  4.   NON obstructive CAD no angina  5.   Tobacco use she is too stressed to stop currently.  Again encouraged to stop.  6.  Insulin dependant DM per IM thought for SGLT 2 is consideration.   Current medicines are reviewed with the patient today.  The patient Has no concerns regarding medicines.  The following changes have been made:  See above Labs/ tests ordered today include:see above  Disposition:   FU:  see above  Signed, Cecilie Kicks, NP  02/16/2019 12:50 PM    [facility] [address] [place] [place], [place] Phone: [phone]; Fax: [phone]

## 2019-02-14 NOTE — Telephone Encounter (Signed)
Patient spouse called stating he took her to the ER today, with increased swelling, they gave her a shot and are planning on discharging her. He is concerned because she has CHF.  He would like to speak to someone, he state she needs IV medication to help he get rid of the fluid.  This has been going on for awhile now, her PCP had increased her Lasix but then had her decrease it again, and told her if it didn't improve to go to the ER and that is were she is at now.

## 2019-02-15 ENCOUNTER — Ambulatory Visit (INDEPENDENT_AMBULATORY_CARE_PROVIDER_SITE_OTHER): Payer: Medicare Other | Admitting: Cardiology

## 2019-02-15 ENCOUNTER — Encounter: Payer: Self-pay | Admitting: Cardiology

## 2019-02-15 ENCOUNTER — Telehealth: Payer: Self-pay | Admitting: Cardiology

## 2019-02-15 VITALS — BP 90/60 | HR 96 | Ht 67.0 in | Wt 241.4 lb

## 2019-02-15 DIAGNOSIS — Z7901 Long term (current) use of anticoagulants: Secondary | ICD-10-CM

## 2019-02-15 DIAGNOSIS — I480 Paroxysmal atrial fibrillation: Secondary | ICD-10-CM

## 2019-02-15 DIAGNOSIS — Z720 Tobacco use: Secondary | ICD-10-CM

## 2019-02-15 DIAGNOSIS — J449 Chronic obstructive pulmonary disease, unspecified: Secondary | ICD-10-CM

## 2019-02-15 DIAGNOSIS — I5033 Acute on chronic diastolic (congestive) heart failure: Secondary | ICD-10-CM

## 2019-02-15 DIAGNOSIS — I2510 Atherosclerotic heart disease of native coronary artery without angina pectoris: Secondary | ICD-10-CM

## 2019-02-15 NOTE — Telephone Encounter (Signed)
Returned pts call and let her know to take 1/2 of the 80 mg Lasix tonight and to go back to 1 tablet twice a day tomorrow, 02/16/2019.  Pt thanked me for the follow-up.

## 2019-02-15 NOTE — Telephone Encounter (Signed)
New Message   Patient is calling to advise that her furosemide does say 80mg  and the tablets are marked as 80mg .

## 2019-02-15 NOTE — Patient Instructions (Addendum)
Medication Instructions:  Your physician has recommended you make the following change in your medication:  TONIGHT only, take 1/2 of the 80 mg of Lasix, tomorrow, 02/16/2019, go back to the original dose of Lasix 80 mg taking 1 tablet twice a day  If you need a refill on your cardiac medications before your next appointment, please call your pharmacy.   Lab work: None ordered If you have labs (blood work) drawn today and your tests are completely normal, you will receive your results only by: Marland Kitchen MyChart Message (if you have MyChart) OR . A paper copy in the mail If you have any lab test that is abnormal or we need to change your treatment, we will call you to review the results.  Testing/Procedures: None ordered  Follow-Up: At [facility], you and your health needs are our priority.  As part of our continuing mission to provide you with exceptional heart care, we have created designated Provider Care Teams.  These Care Teams include your primary Cardiologist (physician) and Advanced Practice Providers (APPs -  Physician Assistants and Nurse Practitioners) who all work together to provide you with the care you need, when you need it. . You have been scheduled for another in-office visit with Richardson Dopp, PA-C, 02/28/2019.  Arrive at 1:30 for registration  Any Other Special Instructions Will Be Listed Below (If Applicable). Make sure to wear support stockings

## 2019-02-16 ENCOUNTER — Encounter: Payer: Self-pay | Admitting: Internal Medicine

## 2019-02-16 ENCOUNTER — Encounter: Payer: Self-pay | Admitting: Cardiology

## 2019-02-27 ENCOUNTER — Telehealth: Payer: Self-pay | Admitting: Physician Assistant

## 2019-02-27 NOTE — Telephone Encounter (Signed)
New Message          COVID-19 Pre-Screening Questions:   In the past 7 to 10 days have you had a cough,  shortness of breath, headache, congestion, fever (100 or greater) body aches, chills, sore throat, or sudden loss of taste or sense of smell? NO  Have you been around anyone with known Covid 19. NO  Have you been around anyone who is awaiting Covid 19 test results in the past 7 to 10 days? NO  Have you been around anyone who has been exposed to Covid 19, or has mentioned symptoms of Covid 19 within the past 7 to 10 days? NO Pts husband says he will need to assist pt with her wheel chair, he answer NO to all COVID questions   If you have any concerns/questions about symptoms patients report during screening (either on the phone or at threshold). Contact the provider seeing the patient or DOD for further guidance.  If neither are available contact a member of the leadership team.

## 2019-02-28 ENCOUNTER — Ambulatory Visit (INDEPENDENT_AMBULATORY_CARE_PROVIDER_SITE_OTHER): Payer: Medicare Other | Admitting: Physician Assistant

## 2019-02-28 ENCOUNTER — Encounter: Payer: Self-pay | Admitting: Physician Assistant

## 2019-02-28 ENCOUNTER — Other Ambulatory Visit: Payer: Self-pay

## 2019-02-28 VITALS — BP 122/62 | HR 77 | Ht 69.0 in | Wt 238.0 lb

## 2019-02-28 DIAGNOSIS — I10 Essential (primary) hypertension: Secondary | ICD-10-CM

## 2019-02-28 DIAGNOSIS — I480 Paroxysmal atrial fibrillation: Secondary | ICD-10-CM | POA: Diagnosis not present

## 2019-02-28 DIAGNOSIS — I5033 Acute on chronic diastolic (congestive) heart failure: Secondary | ICD-10-CM | POA: Diagnosis not present

## 2019-02-28 DIAGNOSIS — I2510 Atherosclerotic heart disease of native coronary artery without angina pectoris: Secondary | ICD-10-CM | POA: Diagnosis not present

## 2019-02-28 MED ORDER — POTASSIUM CHLORIDE CRYS ER 20 MEQ PO TBCR: 40.0000 meq | EXTENDED_RELEASE_TABLET | Freq: Two times a day (BID) | ORAL | 5 refills | Status: DC

## 2019-02-28 MED ORDER — TORSEMIDE 20 MG PO TABS: 20.0000 mg | ORAL_TABLET | Freq: Two times a day (BID) | ORAL | 3 refills | Status: DC

## 2019-02-28 NOTE — Patient Instructions (Addendum)
Medication Instructions:  Stop taking Lasix   Take Torsemide  20 mg  For 3 days Take 40 mg am /20 mg pm then  20 mg twice a day   Take  Potassium  40 meq  For  3 days  Take 60 meq am / 40 meq pm  Then 40 mg twice day    If you need a refill on your cardiac medications before your next appointment, please call your pharmacy.   Lab work: BMET IN ONE WEEK  ON SAME DAY S APPOINTMENT   If you have labs (blood work) drawn today and your tests are completely normal, you will receive your results only by: Marland Kitchen MyChart Message (if you have MyChart) OR . A paper copy in the mail If you have any lab test that is abnormal or we need to change your treatment, we will call you to review the results.  Testing/Procedures: NONE ORDERED  TODAY    Follow-Up: With Richardson Dopp or available  in one week   Any Other Special Instructions Will Be Listed Below (If Applicable).

## 2019-02-28 NOTE — Progress Notes (Signed)
Cardiology Office Note:    Date:  02/28/2019   ID:  Mary [name], DOB [birth year], MRN [id number]  PCP:  Mary Koch, MD  Cardiologist:  Mary Grooms, MD   Electrophysiologist:  Mary Grayer, MD   Referring MD: Mary [name], *   Chief Complaint  Patient presents with  . Follow-up    CHF     History of Present Illness:    Mary [name] is a 78 y.o. female with:   Coronary artery disease (non-obs by [facility] in 04/2018)  Chronic diastolic CHF  Parox AFib  Tachy-brady syndrome s/p Pacer  Hypertension   Hyperlipidemia  Diabetes Mellitus  Ms. Salts was seen in the emergency room 02/14/2019.  She was given a dose of IV Lasix for volume excess.  She saw Mary Kicks, NP the next day on 02/15/2019.  Her Lasix has been adjusted somewhat since then.  She returns for follow up.  She is here with her husband.  Her breathing and swelling have improved since that time.  She is still not to baseline.  She has to sleep sitting up.  She also notes paroxysmal nocturnal dyspnea.  She uses CPAP at night.  She has not had chest pain or syncope.  She has had some wheezing.  She has a nonproductive cough.  She denies fever.  She denies any bleeding issues.   Prior CV studies:   The following studies were reviewed today:  Cardiac catheterization 05/29/2018 LM ostial 25 LAD proximal 50, mid 70 LCx proximal 40, mid 40; OM2 50 RCA proximal 40; RPL B 90 EF 50-55 EDP 15   Echocardiogram 05/26/2018 with contrast EF 55-60, apical septal hypokinesis  Echo 05/22/2018 Mild LVH, EF 40-45, diffuse HK, very mild aortic stenosis, mild LAE, mild to moderate TR, PASP 74   Past Medical History:  Diagnosis Date  . Arthritis   . Atrial tachycardia ([place]) 04/23/2017  . CAD (coronary artery disease)    non obstructive  . 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 Procedure complicated by an atrial lead dislodgment and repeat  procedure, 12/2013   . Carotid stenosis    Carotid US 5/16:  Bilateral ICA 1-39%; L vertebral retrograde; L BP 126/49, R BP 140/57  . Chronic combined systolic and diastolic CHF (congestive heart failure) ([place])   . Chronic lower back pain   . Colon cancer ([place])    a. s/p chemo  . Colon polyps   . Diabetic peripheral neuropathy ([place])   . DVT (deep venous thrombosis) ([place]) 2013   "twice behind knee on left side" (06/25/2013)  . Glaucoma   . Hyperlipidemia   . Hypertension   . Hypothyroidism   . Iron deficiency anemia   . Multinodular goiter   . Osteoporosis   . PAF (paroxysmal atrial fibrillation) ([place])   . Sleep apnea    a. resolved post weight loss   . Type II diabetes mellitus ([place])    Surgical Hx: The patient  has a past surgical history that includes Roux-en-Y Gastric Bypass (2009); Cardiac catheterization (2006); Cholecystectomy (1980's); Umbilical hernia repair (1970's?); Ulnar tunnel release (Left, ~ 2008); Cataract extraction, bilateral (Bilateral, 2013); Eye muscle surgery (Bilateral, ~ 1963); Portacath placement (Left, 11/2010); Colectomy (10/2010); Pacemaker insertion (01/02/2014); Lead revision (01-03-2014); permanent pacemaker insertion (N/A, 01/02/2014); Lead revision (N/A, 01/03/2014); Cardiac catheterization (N/A, 02/04/2015); and LEFT HEART CATH AND CORONARY ANGIOGRAPHY (N/A, 05/29/2018).   Current Medications: Current Meds  Medication Sig  . albuterol (PROVENTIL HFA;VENTOLIN HFA) 108 (90 Base) MCG/ACT inhaler Inhale 2 puffs into the lungs every 6 (six) hours as needed for wheezing or shortness of breath.  Marland Kitchen atorvastatin (LIPITOR) 80 MG tablet TAKE 1 TABLET(80 MG) BY MOUTH DAILY  . bimatoprost (LUMIGAN) 0.01 % SOLN Place 1 drop into the right eye at bedtime.   Marland Kitchen buPROPion (WELLBUTRIN XL) 150 MG 24 hr tablet Take 1 tablet (150 mg total) by mouth daily.  Marland Kitchen ELIQUIS 5 MG TABS tablet TAKE 1 TABLET BY MOUTH TWICE A DAY  . gabapentin (NEURONTIN) 300 MG capsule Take 300 mg by mouth  every evening.   Marland Kitchen HYDROcodone-acetaminophen (NORCO/VICODIN) 5-325 MG tablet Take 1 tablet by mouth every 4 (four) hours as needed for moderate pain.  . Insulin Glargine (BASAGLAR KWIKPEN) 100 UNIT/ML SOPN Inject 0.2 mLs (20 Units total) into the skin daily. (Patient taking differently: Inject 20 Units into the skin at bedtime. )  . levothyroxine (SYNTHROID, LEVOTHROID) 125 MCG tablet Take 125 mcg by mouth daily before breakfast.  . metoprolol succinate (TOPROL XL) 25 MG 24 hr tablet Take 0.5 tablets (12.5 mg total) by mouth at bedtime.  . nitroGLYCERIN (NITROSTAT) 0.4 MG SL tablet Place 1 tablet (0.4 mg total) under the tongue every 5 (five) minutes as needed for chest pain.  . potassium chloride SA (K-DUR) 20 MEQ tablet Take 2 tablets (40 mEq total) by mouth 2 (two) times daily.  . [DISCONTINUED] furosemide (LASIX) 80 MG tablet Take 1 tablet (80 mg total) by mouth 2 (two) times daily.  . [DISCONTINUED] potassium chloride SA (K-DUR,KLOR-CON) 20 MEQ tablet Take 2 tablets (40 mEq total) by mouth 2 (two) times daily.     Allergies:   Other, Adhesive [tape], Aspirin, Latex, and Nsaids   Social History   Tobacco Use  . Smoking status: Former Smoker    Packs/day: 1.00    Years: 30.00    Pack years: 30.00    Types: Cigarettes    Quit date: 02/2017    Years since quitting: 2.0  . Smokeless tobacco: Never Used  . Tobacco comment: started in her [age] until 2009   Substance Use Topics  . Alcohol use: Not Currently    Alcohol/week: 0.0 standard drinks  . Drug use: No     Family Hx: The patient's family history includes Bladder Cancer in her brother and cousin; Breast cancer in her maternal aunt, mother, and sister; Clotting disorder in her sister; Colon polyps in her brother and brother; Diabetes in her maternal grandmother, mother, and sister; Heart attack in her brother, father, and sister; Heart disease in her father; Pancreatic cancer in her brother; Prostate cancer in her brother.  ROS:    Please see the history of present illness.    ROS All other systems reviewed and are negative.   EKGs/Labs/Other Test Reviewed:    EKG:  EKG is not ordered today.  The ekg ordered today demonstrates n/a  Recent Labs: 03/21/2018: TSH 2.22 02/08/2019: ALT 10; Pro B Natriuretic peptide (BNP) 166.0 02/14/2019: B Natriuretic Peptide 84.2; BUN 12; Creatinine, Ser 1.02; Hemoglobin 11.0; Platelets 358; Potassium 3.8; Sodium 135   Recent Lipid Panel Lab Results  Component Value Date/Time   CHOL 124 05/25/2018 05:31 AM   TRIG 81 05/25/2018 05:31 AM   HDL 35 (L) 05/25/2018 05:31 AM   CHOLHDL 3.5 05/25/2018 05:31 AM   LDLCALC 73 05/25/2018 05:31 AM   LDLDIRECT 79.0 03/23/2016 03:29 PM    Physical Exam:  VS:  BP 122/62   Pulse 77   Ht 5\' 9"  (1.753 m)   Wt 238 lb (108 kg)   SpO2 94%   BMI 35.15 kg/m     Wt Readings from Last 3 Encounters:  02/28/19 238 lb (108 kg)  02/15/19 241 lb 6.4 oz (109.5 kg)  02/14/19 243 lb (110.2 kg)     Physical Exam  Constitutional: She is oriented to person, place, and time. She appears well-developed and well-nourished. No distress.  HENT:  Head: Normocephalic and atraumatic.  Eyes: No scleral icterus.  Neck: JVD (JVP 5-6 cm) present. No thyromegaly present.  Cardiovascular: Normal rate and regular rhythm.  No murmur heard. Pulmonary/Chest: Effort normal and breath sounds normal. She has no rales.  Abdominal: Soft. There is no hepatomegaly.  Musculoskeletal:        General: Edema (1+ bilat LE edema) present.  Lymphadenopathy:    She has no cervical adenopathy.  Neurological: She is alert and oriented to person, place, and time.  Skin: Skin is warm and dry.  Psychiatric: She has a normal mood and affect.    ASSESSMENT & PLAN:    1. Acute on chronic diastolic congestive heart failure ([place]) EF 55-60 by echo in September 2019.  She remains volume overloaded.  Recent chest x-ray in the emergency room demonstrated no edema.  Interestingly  enough, her BNP was also normal.  However, she clearly has evidence of volume excess on exam.  I do not think she is responding well to furosemide.  I recommend that we change her over to torsemide and see her back again next week.  -DC furosemide  -Start torsemide 40 mg every morning and 20 mg every afternoon x3 days  -After 3 days, reduce torsemide to 20 mg twice daily  -Increase potassium to 60 mEq in the morning, 40 mEq in the evening x3 days  -After 3 days resume usual dose of potassium  -Follow-up 1 week with follow-up BMET  2. Paroxysmal atrial fibrillation ([facility]) Most recent ECG in the emergency room demonstrated AV pacing.  Continue anticoagulation with Apixaban.  3. Coronary artery disease involving native coronary artery of native heart without angina pectoris Moderate nonobstructive disease by cardiac catheterization September 2019.  She denies anginal symptoms.  Continue statin therapy.  4. Essential hypertension The patient's blood pressure is controlled on her current regimen.  Continue current therapy.    Dispo:  Return in about 1 week (around 03/07/2019) for Close Follow Up, w/ Richardson Dopp, PA-C, or PA/NP on Dr. Thompson Caul team.   Medication Adjustments/Labs and Tests Ordered: Current medicines are reviewed at length with the patient today.  Concerns regarding medicines are outlined above.  Tests Ordered: Orders Placed This Encounter  Procedures  . Basic metabolic panel   Medication Changes: Meds ordered this encounter  Medications  . torsemide (DEMADEX) 20 MG tablet    Sig: Take 1 tablet (20 mg total) by mouth 2 (two) times daily.    Dispense:  180 tablet    Refill:  3  . potassium chloride SA (K-DUR) 20 MEQ tablet    Sig: Take 2 tablets (40 mEq total) by mouth 2 (two) times daily.    Dispense:  120 tablet    Refill:  5    Signed, Richardson Dopp, PA-C  02/28/2019 5:01 PM    [facility] [address], [place], [place]  [address] Phone: [phone]; Fax: [phone]

## 2019-03-06 ENCOUNTER — Ambulatory Visit: Payer: Medicare Other | Admitting: Physician Assistant

## 2019-03-06 ENCOUNTER — Other Ambulatory Visit: Payer: Medicare Other

## 2019-03-18 ENCOUNTER — Other Ambulatory Visit: Payer: Self-pay

## 2019-03-18 ENCOUNTER — Emergency Department (HOSPITAL_COMMUNITY): Payer: Medicare Other

## 2019-03-18 ENCOUNTER — Encounter (HOSPITAL_COMMUNITY): Payer: Self-pay | Admitting: Emergency Medicine

## 2019-03-18 ENCOUNTER — Emergency Department (HOSPITAL_COMMUNITY)
Admission: EM | Admit: 2019-03-18 | Discharge: 2019-03-18 | Disposition: A | Discharge status: Home / Self Care | Payer: Medicare Other | Attending: Emergency Medicine | Admitting: Emergency Medicine

## 2019-03-18 DIAGNOSIS — Y999 Unspecified external cause status: Secondary | ICD-10-CM | POA: Insufficient documentation

## 2019-03-18 DIAGNOSIS — S0512XA Contusion of eyeball and orbital tissues, left eye, initial encounter: Secondary | ICD-10-CM | POA: Diagnosis not present

## 2019-03-18 DIAGNOSIS — I2510 Atherosclerotic heart disease of native coronary artery without angina pectoris: Secondary | ICD-10-CM | POA: Insufficient documentation

## 2019-03-18 DIAGNOSIS — Z23 Encounter for immunization: Secondary | ICD-10-CM | POA: Diagnosis not present

## 2019-03-18 DIAGNOSIS — I480 Paroxysmal atrial fibrillation: Secondary | ICD-10-CM | POA: Insufficient documentation

## 2019-03-18 DIAGNOSIS — R51 Headache: Secondary | ICD-10-CM | POA: Insufficient documentation

## 2019-03-18 DIAGNOSIS — I5043 Acute on chronic combined systolic (congestive) and diastolic (congestive) heart failure: Secondary | ICD-10-CM | POA: Diagnosis not present

## 2019-03-18 DIAGNOSIS — Y9389 Activity, other specified: Secondary | ICD-10-CM | POA: Insufficient documentation

## 2019-03-18 DIAGNOSIS — W19XXXA Unspecified fall, initial encounter: Secondary | ICD-10-CM

## 2019-03-18 DIAGNOSIS — J449 Chronic obstructive pulmonary disease, unspecified: Secondary | ICD-10-CM | POA: Insufficient documentation

## 2019-03-18 DIAGNOSIS — S0012XA Contusion of left eyelid and periocular area, initial encounter: Secondary | ICD-10-CM | POA: Insufficient documentation

## 2019-03-18 DIAGNOSIS — I110 Hypertensive heart disease with heart failure: Secondary | ICD-10-CM | POA: Diagnosis not present

## 2019-03-18 DIAGNOSIS — Z950 Presence of cardiac pacemaker: Secondary | ICD-10-CM | POA: Diagnosis not present

## 2019-03-18 DIAGNOSIS — S0990XA Unspecified injury of head, initial encounter: Secondary | ICD-10-CM | POA: Diagnosis not present

## 2019-03-18 DIAGNOSIS — Y92008 Other place in unspecified non-institutional (private) residence as the place of occurrence of the external cause: Secondary | ICD-10-CM | POA: Insufficient documentation

## 2019-03-18 DIAGNOSIS — E119 Type 2 diabetes mellitus without complications: Secondary | ICD-10-CM | POA: Diagnosis not present

## 2019-03-18 DIAGNOSIS — S0993XA Unspecified injury of face, initial encounter: Secondary | ICD-10-CM | POA: Diagnosis not present

## 2019-03-18 DIAGNOSIS — W01198A Fall on same level from slipping, tripping and stumbling with subsequent striking against other object, initial encounter: Secondary | ICD-10-CM | POA: Insufficient documentation

## 2019-03-18 DIAGNOSIS — S199XXA Unspecified injury of neck, initial encounter: Secondary | ICD-10-CM | POA: Diagnosis not present

## 2019-03-18 MED ORDER — ACETAMINOPHEN 325 MG PO TABS
650.0000 mg | ORAL_TABLET | Freq: Once | ORAL | Status: AC
  Administered 2019-03-18: 650 mg via ORAL
  Filled 2019-03-18: qty 2

## 2019-03-18 MED ORDER — TETANUS-DIPHTH-ACELL PERTUSSIS 5-2.5-18.5 LF-MCG/0.5 IM SUSP
0.5000 mL | Freq: Once | INTRAMUSCULAR | Status: AC
  Administered 2019-03-18: 0.5 mL via INTRAMUSCULAR
  Filled 2019-03-18: qty 0.5

## 2019-03-18 NOTE — Discharge Instructions (Signed)
Your head imaging today was reassuring with no signs of broken bones or bleeding.  You can take Tylenol every 6 hours as needed for pain.  Apply ice to areas of swelling.  Your tetanus was updated.   Follow-up with your primary care provider for reevaluation of your symptoms if they persist.  Please return to the emergency department if any concerning signs or symptoms develop such as persistent vomiting, severe headaches, vision changes, weakness to one side of the body, fevers, or loss of consciousness.

## 2019-03-18 NOTE — ED Provider Notes (Signed)
[facility] Provider Note   CSN: [id number] Arrival date & time: 03/18/19  1913    History   Chief Complaint Chief Complaint  Patient presents with   Fall    HPI Mary [name] is a 79 y.o. female with history of CAD, DVT, hyperlipidemia, hypertension, hypothyroidism, paroxysmal A. fib with the cardiac pacemaker, type 2 diabetes mellitus, COPD, and CHF presents for evaluation of acute onset, persistent swelling and right-sided facial pain secondary to mechanical fall at around 5:30 PM.  She reports that she has chronic low back pain which will occasionally result in cramps and spasms in her lower extremities.  She states that when this occurs she will take a tablet of hydrocodone.  She states around 5:30 PM her right lower extremity began to climb up and so she stood up to go get water from her kitchen so that she could take her hydrocodone.  She states that when she stood up she believes her right lower extremity gave out resulting in the left side of her face striking the wall.  She states she then slid down the wall.  Does not think that she lost consciousness but she did lay on the ground for around half an hour attempting to stand up.  Does not think she had any prodrome leading up to the fall.  Typically ambulates with the aid of a walker or a cane but did not use one today on her way to get a glass of water.  Denies any chest pain, shortness of breath, abdominal pain, nausea, vomiting, vision changes, or confusion surrounding the fall.  Denies any new numbness or tingling in her arms or legs.  Unsure if her tetanus is up-to-date.  He is currently anticoagulated on Eliquis.  She notes soreness to the left side of the face with some associated swelling.     The history is provided by the patient.    Past Medical History:  Diagnosis Date   Arthritis    Atrial tachycardia ([place]) 04/23/2017   CAD (coronary artery disease)    non obstructive    Cardiac pacemaker in situ - St Jude 05/13/2014   Permanent pacemaker for second-degree heart block. Procedure complicated by an atrial lead dislodgment and repeat procedure, 12/2013    Carotid stenosis    Carotid US 5/16:  Bilateral ICA 1-39%; L vertebral retrograde; L BP 126/49, R BP 140/57   Chronic combined systolic and diastolic CHF (congestive heart failure) (HCC)    Chronic lower back pain    Colon cancer (HCC)    a. s/p chemo   Colon polyps    Diabetic peripheral neuropathy (HCC)    DVT (deep venous thrombosis) ([place]) 2013   "twice behind knee on left side" (06/25/2013)   Glaucoma    Hyperlipidemia    Hypertension    Hypothyroidism    Iron deficiency anemia    Multinodular goiter    Osteoporosis    PAF (paroxysmal atrial fibrillation) ([place])    Sleep apnea    a. resolved post weight loss    Type II diabetes mellitus ([place])     Patient Active Problem List   Diagnosis Date Noted   OSA (obstructive sleep apnea) 06/07/2018   COPD (chronic obstructive pulmonary disease) ([place]) 05/24/2018   Acute on chronic combined systolic and diastolic CHF (congestive heart failure) ([place]) 05/24/2018   Acute respiratory failure with hypoxia ([place]) 05/21/2018   Osteoarthritis 05/25/2017   Atrial tachycardia ([place]) 04/23/2017   Second degree  AV block, Mobitz type II    Depression 08/02/2016   Chronic diastolic heart failure ([place]) 08/02/2016   Tobacco abuse 08/02/2016   Right lumbar radiculitis 06/09/2016   Tear of medial meniscus of right knee, current 04/22/2016   Chronic venous insufficiency 02/21/2015   Hx of gastric bypass 02/20/2015   Cardiac pacemaker in situ - St Jude 05/13/2014   Atrioventricular block, complete (HCC) 01/02/2014   Chronic anticoagulation 10/09/2013   PAF (paroxysmal atrial fibrillation) ([place]) 08/29/2013   Obesity 05/05/2013   CAD in native artery    Diabetes mellitus with complication ([place])    Hypothyroidism    Hypertension      Colon cancer ([place])    Hyperlipidemia     Past Surgical History:  Procedure Laterality Date   CARDIAC CATHETERIZATION  2006   Never had PCI, 3 caths total. Last one in [place] N/A 02/04/2015   Procedure: Left Heart Cath and Coronary Angiography;  Surgeon: Belva Crome, MD;  Location: [facility];  Service: Cardiovascular;  Laterality: N/A;   CATARACT EXTRACTION, BILATERAL Bilateral 2013   "and put stent in my left eye for glaucoma" (06/25/2013)   CHOLECYSTECTOMY  1980's   COLECTOMY  10/2010   Tumor removal   EYE MUSCLE SURGERY Bilateral ~ 1963   "muscles too long; eyes would go out and up; tied muscles to hold my eyes straight" (06/25/2013)   LEAD REVISION  01-03-2014   atrial lead revision by Dr Rayann Heman   LEAD REVISION N/A 01/03/2014   Procedure: LEAD REVISION;  Surgeon: Coralyn Mark, MD;  Location: [facility] CATH LAB;  Service: Cardiovascular;  Laterality: N/A;   LEFT HEART CATH AND CORONARY ANGIOGRAPHY N/A 05/29/2018   Procedure: LEFT HEART CATH AND CORONARY ANGIOGRAPHY;  Surgeon: Belva Crome, MD;  Location: [facility];  Service: Cardiovascular;  Laterality: N/A;   PACEMAKER INSERTION  01/02/2014   STJ Assurity dual chamber pacemaker implnated by Dr Lovena Le for SSS   PERMANENT PACEMAKER INSERTION N/A 01/02/2014   Procedure: PERMANENT PACEMAKER INSERTION;  Surgeon: Evans Lance, MD;  Location: [facility] CATH LAB;  Service: Cardiovascular;  Laterality: N/A;   PORTACATH PLACEMENT Left 11/2010   ROUX-EN-Y GASTRIC BYPASS  2009   ULNAR TUNNEL RELEASE Left ~ 0814   UMBILICAL HERNIA REPAIR  1970's?    (06/25/2013)     OB History   No obstetric history on file.      Home Medications    Prior to Admission medications   Medication Sig Start Date End Date Taking? Authorizing Provider  albuterol (PROVENTIL HFA;VENTOLIN HFA) 108 (90 Base) MCG/ACT inhaler Inhale 2 puffs into the lungs every 6 (six) hours as needed for wheezing or shortness of  breath. 05/30/18   Regalado, Belkys A, MD  atorvastatin (LIPITOR) 80 MG tablet TAKE 1 TABLET(80 MG) BY MOUTH DAILY 08/21/18   Hoyt Koch, MD  bimatoprost (LUMIGAN) 0.01 % SOLN Place 1 drop into the right eye at bedtime.     [provider]  buPROPion (WELLBUTRIN XL) 150 MG 24 hr tablet Take 1 tablet (150 mg total) by mouth daily. 05/12/18   Hoyt Koch, MD  ELIQUIS 5 MG TABS tablet TAKE 1 TABLET BY MOUTH TWICE A DAY 11/10/18   Allred, Jeneen Rinks, MD  gabapentin (NEURONTIN) 300 MG capsule Take 300 mg by mouth every evening.     [provider]  HYDROcodone-acetaminophen (NORCO/VICODIN) 5-325 MG tablet Take 1 tablet by mouth every 4 (four) hours as  needed for moderate pain. 02/08/19   Hoyt Koch, MD  Insulin Glargine (BASAGLAR KWIKPEN) 100 UNIT/ML SOPN Inject 0.2 mLs (20 Units total) into the skin daily. Patient taking differently: Inject 20 Units into the skin at bedtime.  05/30/18   Regalado, Belkys A, MD  levothyroxine (SYNTHROID, LEVOTHROID) 125 MCG tablet Take 125 mcg by mouth daily before breakfast. 01/10/18   [provider]  metoprolol succinate (TOPROL XL) 25 MG 24 hr tablet Take 0.5 tablets (12.5 mg total) by mouth at bedtime. 05/10/18   Patsey Berthold, NP  nitroGLYCERIN (NITROSTAT) 0.4 MG SL tablet Place 1 tablet (0.4 mg total) under the tongue every 5 (five) minutes as needed for chest pain. 08/26/17   Belva Crome, MD  potassium chloride SA (K-DUR) 20 MEQ tablet Take 2 tablets (40 mEq total) by mouth 2 (two) times daily. 02/28/19   Richardson Dopp T, PA-C  spironolactone (ALDACTONE) 25 MG tablet Take 0.5 tablets (12.5 mg total) by mouth daily. 08/09/18 02/14/19  Isaiah Serge, NP  torsemide (DEMADEX) 20 MG tablet Take 1 tablet (20 mg total) by mouth 2 (two) times daily. 02/28/19 05/29/19  Liliane Shi, PA-C    Family History Family History  Problem Relation Age of Onset   Breast cancer Mother    Diabetes Mother    Heart disease Father     Heart attack Father    Heart attack Sister    Heart attack Brother    Bladder Cancer Brother    Prostate cancer Brother    Colon polyps Brother    Breast cancer Sister    Breast cancer Maternal Aunt        x 2 aunts   Pancreatic cancer Brother    Colon polyps Brother    Bladder Cancer Cousin    Clotting disorder Sister    Diabetes Sister        x 3   Diabetes Maternal Grandmother     Social History Social History   Tobacco Use   Smoking status: Former Smoker    Packs/day: 1.00    Years: 30.00    Pack years: 30.00    Types: Cigarettes    Quit date: 02/2017    Years since quitting: 2.0   Smokeless tobacco: Never Used   Tobacco comment: started in her [age] until 2009   Substance Use Topics   Alcohol use: Not Currently    Alcohol/week: 0.0 standard drinks   Drug use: No     Allergies   Other, Adhesive [tape], Aspirin, Latex, and Nsaids   Review of Systems Review of Systems  Constitutional: Negative for chills and fever.  HENT: Positive for facial swelling.   Respiratory: Negative for shortness of breath.   Cardiovascular: Negative for chest pain.  Gastrointestinal: Negative for abdominal pain, nausea and vomiting.  Musculoskeletal: Positive for back pain (chronic, unchanged).  Neurological: Positive for headaches. Negative for syncope, weakness and numbness.  All other systems reviewed and are negative.    Physical Exam Updated Vital Signs BP 102/63    Pulse 70    Temp 98.4 F (36.9 C) (Oral)    Resp (!) 24    Ht 5\' 7"  (1.702 m)    Wt 108 kg    SpO2 92%    BMI 37.29 kg/m   Physical Exam Vitals signs and nursing note reviewed.  Constitutional:      General: She is not in acute distress.    Appearance: She is well-developed.  HENT:  Head: Normocephalic.     Comments: Mild ecchymosis and swelling around the left orbit and zygomatic arch.  No active bleeding.  No battle signs,no rhinorrhea.  No septal hematoma.  Dentition appears  stable.  No trismus or jaw malalignment.  No crepitus or deformity. Eyes:     General:        Right eye: No discharge.        Left eye: No discharge.     Extraocular Movements: Extraocular movements intact.     Conjunctiva/sclera: Conjunctivae normal.     Pupils: Pupils are equal, round, and reactive to light.  Neck:     Musculoskeletal: Normal range of motion and neck supple.     Vascular: No JVD.     Trachea: No tracheal deviation.     Comments: No midline cervical spine TTP, no paraspinal muscle tenderness, no deformity, crepitus, or step-off noted  Cardiovascular:     Rate and Rhythm: Normal rate and regular rhythm.     Comments: 2+ radial and DP/PT pulses bilaterally.  1+ pitting edema of the bilateral lower extremities which patient reports is chronic and actually improved from baseline. Pulmonary:     Effort: Pulmonary effort is normal.     Comments: Soft scattered expiratory wheezes, speaking in full sentences without difficulty with no increased work of breathing.  Atraumatic examination of the chest with no tenderness, crepitus, ecchymosis, or flail segment Chest:     Chest wall: No tenderness.  Abdominal:     General: Bowel sounds are normal. There is no distension.     Palpations: Abdomen is soft.     Tenderness: There is no abdominal tenderness. There is no guarding or rebound.     Comments: Atraumatic examination  Musculoskeletal: Normal range of motion.     Comments: No midline TSP or LSP TTP, no paraspinal muscle tenderness, no deformity, crepitus, or step-off noted   Skin:    General: Skin is warm and dry.     Findings: No erythema.  Neurological:     General: No focal deficit present.     Mental Status: She is alert and oriented to person, place, and time.     Sensory: No sensory deficit.     Comments: Mental Status:  Alert, thought content appropriate, able to give a coherent history. Speech fluent without evidence of aphasia. Able to follow 2 step commands  without difficulty.  Cranial Nerves:  II:  Peripheral visual fields grossly normal, pupils equal, round, reactive to light III,IV, VI: ptosis not present, extra-ocular motions intact bilaterally  V,VII: smile symmetric, facial light touch sensation equal VIII: hearing grossly normal to voice  X: uvula elevates symmetrically  XI: bilateral shoulder shrug symmetric and strong XII: midline tongue extension without fassiculations Motor:  Normal tone. 5/5 strength of BUE major muscle groups including strong and equal grip strength and 4+/5 strength of BLE major muscle groups Sensory: light touch normal in all extremities.    Psychiatric:        Behavior: Behavior normal.      ED Treatments / Results  Labs (all labs ordered are listed, but only abnormal results are displayed) Labs Reviewed - No data to display  EKG None  Radiology Ct Head Wo Contrast  Result Date: 03/18/2019 CLINICAL DATA:  Fall. Head trauma. On anticoagulation. EXAM: CT HEAD WITHOUT CONTRAST CT MAXILLOFACIAL WITHOUT CONTRAST CT CERVICAL SPINE WITHOUT CONTRAST TECHNIQUE: Multidetector CT imaging of the head, cervical spine, and maxillofacial structures were performed using the standard protocol without  intravenous contrast. Multiplanar CT image reconstructions of the cervical spine and maxillofacial structures were also generated. COMPARISON:  Head CT 01/27/2015 FINDINGS: CT HEAD FINDINGS Brain: There is no evidence of acute infarct, intracranial hemorrhage, midline shift, or extra-axial fluid collection. Mild cerebral atrophy is unchanged. Patchy cerebral white matter hypodensities have progressed and are nonspecific but compatible with moderate chronic small vessel ischemic disease. An 8 mm focus of extra-axial calcification over the left frontal convexity is unchanged and may be associated with frontal skull hyperostosis or reflect a small, inconsequential meningioma. Vascular: Calcified atherosclerosis at the skull base.  No hyperdense vessel. Skull: No fracture or suspicious osseous lesion. Other: None. CT MAXILLOFACIAL FINDINGS Osseous: No fracture or, mandibular dislocation, or suspicious osseous lesion. Orbits: Bilateral cataract extraction. Sinuses: Trace chronic left mastoid effusion. Clear paranasal sinuses. Soft tissues: Mild left facial soft tissue swelling. CT CERVICAL SPINE FINDINGS Alignment: Cervical spine straightening. No listhesis. Skull base and vertebrae: No acute fracture or suspicious osseous lesion. Soft tissues and spinal canal: No prevertebral fluid or swelling. No visible canal hematoma. Disc levels: Focally advanced disc space narrowing at C6-7 with degenerative endplate changes and spurring and suspected mild spinal stenosis. C5-6 disc bulging with mild spinal stenosis. No osseous neural foraminal stenosis. Upper chest: Clear lung apices. Other: Mild-to-moderate asymmetric enlargement of the right thyroid lobe, incompletely imaged. Punctate calcifications posteriorly in the left submandibular gland. IMPRESSION: 1. No evidence of acute intracranial abnormality. 2. Moderate chronic small vessel ischemic disease. 3. No maxillofacial fracture. 4. No evidence of acute cervical spine fracture or traumatic subluxation. Electronically Signed   By: Logan Bores M.D.   On: 03/18/2019 21:14   Ct Cervical Spine Wo Contrast  Result Date: 03/18/2019 CLINICAL DATA:  Fall. Head trauma. On anticoagulation. EXAM: CT HEAD WITHOUT CONTRAST CT MAXILLOFACIAL WITHOUT CONTRAST CT CERVICAL SPINE WITHOUT CONTRAST TECHNIQUE: Multidetector CT imaging of the head, cervical spine, and maxillofacial structures were performed using the standard protocol without intravenous contrast. Multiplanar CT image reconstructions of the cervical spine and maxillofacial structures were also generated. COMPARISON:  Head CT 01/27/2015 FINDINGS: CT HEAD FINDINGS Brain: There is no evidence of acute infarct, intracranial hemorrhage, midline shift, or  extra-axial fluid collection. Mild cerebral atrophy is unchanged. Patchy cerebral white matter hypodensities have progressed and are nonspecific but compatible with moderate chronic small vessel ischemic disease. An 8 mm focus of extra-axial calcification over the left frontal convexity is unchanged and may be associated with frontal skull hyperostosis or reflect a small, inconsequential meningioma. Vascular: Calcified atherosclerosis at the skull base. No hyperdense vessel. Skull: No fracture or suspicious osseous lesion. Other: None. CT MAXILLOFACIAL FINDINGS Osseous: No fracture or, mandibular dislocation, or suspicious osseous lesion. Orbits: Bilateral cataract extraction. Sinuses: Trace chronic left mastoid effusion. Clear paranasal sinuses. Soft tissues: Mild left facial soft tissue swelling. CT CERVICAL SPINE FINDINGS Alignment: Cervical spine straightening. No listhesis. Skull base and vertebrae: No acute fracture or suspicious osseous lesion. Soft tissues and spinal canal: No prevertebral fluid or swelling. No visible canal hematoma. Disc levels: Focally advanced disc space narrowing at C6-7 with degenerative endplate changes and spurring and suspected mild spinal stenosis. C5-6 disc bulging with mild spinal stenosis. No osseous neural foraminal stenosis. Upper chest: Clear lung apices. Other: Mild-to-moderate asymmetric enlargement of the right thyroid lobe, incompletely imaged. Punctate calcifications posteriorly in the left submandibular gland. IMPRESSION: 1. No evidence of acute intracranial abnormality. 2. Moderate chronic small vessel ischemic disease. 3. No maxillofacial fracture. 4. No evidence of acute cervical spine fracture or  traumatic subluxation. Electronically Signed   By: Logan Bores M.D.   On: 03/18/2019 21:14   Ct Maxillofacial Wo Contrast  Result Date: 03/18/2019 CLINICAL DATA:  Fall. Head trauma. On anticoagulation. EXAM: CT HEAD WITHOUT CONTRAST CT MAXILLOFACIAL WITHOUT CONTRAST CT  CERVICAL SPINE WITHOUT CONTRAST TECHNIQUE: Multidetector CT imaging of the head, cervical spine, and maxillofacial structures were performed using the standard protocol without intravenous contrast. Multiplanar CT image reconstructions of the cervical spine and maxillofacial structures were also generated. COMPARISON:  Head CT 01/27/2015 FINDINGS: CT HEAD FINDINGS Brain: There is no evidence of acute infarct, intracranial hemorrhage, midline shift, or extra-axial fluid collection. Mild cerebral atrophy is unchanged. Patchy cerebral white matter hypodensities have progressed and are nonspecific but compatible with moderate chronic small vessel ischemic disease. An 8 mm focus of extra-axial calcification over the left frontal convexity is unchanged and may be associated with frontal skull hyperostosis or reflect a small, inconsequential meningioma. Vascular: Calcified atherosclerosis at the skull base. No hyperdense vessel. Skull: No fracture or suspicious osseous lesion. Other: None. CT MAXILLOFACIAL FINDINGS Osseous: No fracture or, mandibular dislocation, or suspicious osseous lesion. Orbits: Bilateral cataract extraction. Sinuses: Trace chronic left mastoid effusion. Clear paranasal sinuses. Soft tissues: Mild left facial soft tissue swelling. CT CERVICAL SPINE FINDINGS Alignment: Cervical spine straightening. No listhesis. Skull base and vertebrae: No acute fracture or suspicious osseous lesion. Soft tissues and spinal canal: No prevertebral fluid or swelling. No visible canal hematoma. Disc levels: Focally advanced disc space narrowing at C6-7 with degenerative endplate changes and spurring and suspected mild spinal stenosis. C5-6 disc bulging with mild spinal stenosis. No osseous neural foraminal stenosis. Upper chest: Clear lung apices. Other: Mild-to-moderate asymmetric enlargement of the right thyroid lobe, incompletely imaged. Punctate calcifications posteriorly in the left submandibular gland. IMPRESSION:  1. No evidence of acute intracranial abnormality. 2. Moderate chronic small vessel ischemic disease. 3. No maxillofacial fracture. 4. No evidence of acute cervical spine fracture or traumatic subluxation. Electronically Signed   By: Logan Bores M.D.   On: 03/18/2019 21:14    Procedures Procedures (including critical care time)  Medications Ordered in ED Medications  Tdap (BOOSTRIX) injection 0.5 mL (0.5 mLs Intramuscular Given 03/18/19 2128)  acetaminophen (TYLENOL) tablet 650 mg (650 mg Oral Given 03/18/19 2127)     Initial Impression / Assessment and Plan / ED Course  I have reviewed the triage vital signs and the nursing notes.  Pertinent labs & imaging results that were available during my care of the patient were reviewed by me and considered in my medical decision making (see chart for details).        Patient presenting for evaluation of some periorbital ecchymosis and swelling to the left side after mechanical fall due to chronic back pain and subsequent lower extremity muscle spasm/cramps.  Patient afebrile, vital signs are stable.  She is nontoxic in appearance.  She is neurovascularly intact.  Currently anticoagulated on Eliquis.  Doubt ACS/MI or PE in the absence of prodrome or any complaint of chest pain, shortness of breath, or increased work of breathing.  No evidence of serious head injury, intrathoracic, or intra-abdominal injury.  No midline spine tenderness.  Given she is currently anticoagulated we will obtain imaging to rule out acute traumatic injury.  Imaging unremarkable with no evidence of acute intracranial abnormality, ICH, SAH, skull fracture, or cervical spine injury.  No evidence of facial fracture.  Patient resting comfortably no apparent distress.  Tetanus was updated in the ED.  Recommend follow-up with  PCP if symptoms persist.  Discussed strict ED return precautions. Patient verbalized understanding of and agreement with plan and is safe for discharge home at  this time. Patient seen and evaluated by Dr. Venora Maples who agrees with assessment and plan at this time.   Final Clinical Impressions(s) / ED Diagnoses   Final diagnoses:  Fall, initial encounter  Minor head injury, initial encounter    ED Discharge Orders    None       Debroah Baller 03/18/19 2320    Jola Schmidt, MD 03/18/19 2337

## 2019-03-18 NOTE — ED Triage Notes (Signed)
Pt presents after fall caused by cramping to R leg while at home @ 1730, pt struck wall with L side of face, red areas noted. Pt denies LOC was on floor for @ 1 hour before being able to get up.  Pt does take Eliquis.

## 2019-03-19 ENCOUNTER — Ambulatory Visit (INDEPENDENT_AMBULATORY_CARE_PROVIDER_SITE_OTHER): Payer: Medicare Other | Admitting: *Deleted

## 2019-03-19 DIAGNOSIS — I442 Atrioventricular block, complete: Secondary | ICD-10-CM | POA: Diagnosis not present

## 2019-03-19 LAB — CUP PACEART REMOTE DEVICE CHECK
Date Time Interrogation Session: 20200720163019
Implantable Lead Implant Date: 20150506
Implantable Lead Implant Date: 20150506
Implantable Lead Location: 753859
Implantable Lead Location: 753860
Implantable Pulse Generator Implant Date: 20150506
Pulse Gen Model: 2240
Pulse Gen Serial Number: 3013730

## 2019-03-27 ENCOUNTER — Other Ambulatory Visit: Payer: Self-pay | Admitting: *Deleted

## 2019-03-27 DIAGNOSIS — I5033 Acute on chronic diastolic (congestive) heart failure: Secondary | ICD-10-CM

## 2019-03-27 DIAGNOSIS — Z79899 Other long term (current) drug therapy: Secondary | ICD-10-CM

## 2019-03-27 DIAGNOSIS — I10 Essential (primary) hypertension: Secondary | ICD-10-CM

## 2019-03-27 NOTE — Telephone Encounter (Signed)
Please call the patient. She can take an extra Torsemide 20 mg once daily as needed for increased swelling. She did not have any follow up lab work after I saw her recently.  She needs a BMET. PLAN:  1. Ok to take extra Torsemide 20 mg once daily as needed for increased swelling. 2. Please schedule a follow up BMET this week. Richardson Dopp, PA-C    03/27/2019 4:50 PM

## 2019-03-30 ENCOUNTER — Other Ambulatory Visit: Payer: Medicare Other

## 2019-04-02 ENCOUNTER — Encounter: Payer: Self-pay | Admitting: Cardiology

## 2019-04-02 ENCOUNTER — Other Ambulatory Visit: Payer: Self-pay

## 2019-04-02 ENCOUNTER — Other Ambulatory Visit: Payer: Medicare Other | Admitting: *Deleted

## 2019-04-02 DIAGNOSIS — Z79899 Other long term (current) drug therapy: Secondary | ICD-10-CM

## 2019-04-02 DIAGNOSIS — I5033 Acute on chronic diastolic (congestive) heart failure: Secondary | ICD-10-CM | POA: Diagnosis not present

## 2019-04-02 DIAGNOSIS — I10 Essential (primary) hypertension: Secondary | ICD-10-CM | POA: Diagnosis not present

## 2019-04-02 NOTE — Progress Notes (Signed)
Remote pacemaker transmission.   

## 2019-04-03 LAB — BASIC METABOLIC PANEL
BUN/Creatinine Ratio: 17 (ref 12–28)
BUN: 20 mg/dL (ref 8–27)
CO2: 28 mmol/L (ref 20–29)
Calcium: 8.9 mg/dL (ref 8.7–10.3)
Chloride: 94 mmol/L — ABNORMAL LOW (ref 96–106)
Creatinine, Ser: 1.15 mg/dL — ABNORMAL HIGH (ref 0.57–1.00)
GFR calc Af Amer: 53 mL/min/{1.73_m2} — ABNORMAL LOW (ref >59)
GFR calc non Af Amer: 46 mL/min/{1.73_m2} — ABNORMAL LOW (ref >59)
Glucose: 130 mg/dL — ABNORMAL HIGH (ref 65–99)
Potassium: 4.8 mmol/L (ref 3.5–5.2)
Sodium: 135 mmol/L (ref 134–144)

## 2019-04-06 DIAGNOSIS — Z1509 Genetic susceptibility to other malignant neoplasm: Secondary | ICD-10-CM | POA: Diagnosis not present

## 2019-04-06 DIAGNOSIS — Z1371 Encounter for nonprocreative screening for genetic disease carrier status: Secondary | ICD-10-CM | POA: Diagnosis not present

## 2019-04-06 DIAGNOSIS — Z85038 Personal history of other malignant neoplasm of large intestine: Secondary | ICD-10-CM | POA: Diagnosis not present

## 2019-04-06 DIAGNOSIS — Z809 Family history of malignant neoplasm, unspecified: Secondary | ICD-10-CM | POA: Diagnosis not present

## 2019-04-09 ENCOUNTER — Other Ambulatory Visit: Payer: Self-pay

## 2019-04-09 MED ORDER — NITROGLYCERIN 0.4 MG SL SUBL: 0.4000 mg | SUBLINGUAL_TABLET | SUBLINGUAL | 2 refills | Status: DC | PRN

## 2019-04-13 DIAGNOSIS — Z85038 Personal history of other malignant neoplasm of large intestine: Secondary | ICD-10-CM | POA: Diagnosis not present

## 2019-04-22 ENCOUNTER — Other Ambulatory Visit: Payer: Self-pay | Admitting: Internal Medicine

## 2019-04-24 ENCOUNTER — Other Ambulatory Visit: Payer: Self-pay

## 2019-04-24 MED ORDER — HYDROCODONE-ACETAMINOPHEN 5-325 MG PO TABS: 1.0000 | ORAL_TABLET | ORAL | 0 refills | Status: DC | PRN

## 2019-04-24 NOTE — Telephone Encounter (Signed)
Control database checked last refill: 02/08/2019 LOV: 02/08/2019 [id number]

## 2019-04-26 ENCOUNTER — Encounter: Payer: Self-pay | Admitting: Internal Medicine

## 2019-05-09 ENCOUNTER — Encounter: Payer: Self-pay | Admitting: Interventional Cardiology

## 2019-05-09 ENCOUNTER — Ambulatory Visit (INDEPENDENT_AMBULATORY_CARE_PROVIDER_SITE_OTHER): Payer: Medicare Other | Admitting: Interventional Cardiology

## 2019-05-09 ENCOUNTER — Other Ambulatory Visit: Payer: Self-pay

## 2019-05-09 VITALS — BP 142/60 | HR 72 | Ht 67.0 in | Wt 236.0 lb

## 2019-05-09 DIAGNOSIS — Z720 Tobacco use: Secondary | ICD-10-CM

## 2019-05-09 DIAGNOSIS — I25118 Atherosclerotic heart disease of native coronary artery with other forms of angina pectoris: Secondary | ICD-10-CM

## 2019-05-09 DIAGNOSIS — Z7901 Long term (current) use of anticoagulants: Secondary | ICD-10-CM | POA: Diagnosis not present

## 2019-05-09 DIAGNOSIS — I5032 Chronic diastolic (congestive) heart failure: Secondary | ICD-10-CM

## 2019-05-09 DIAGNOSIS — I10 Essential (primary) hypertension: Secondary | ICD-10-CM

## 2019-05-09 DIAGNOSIS — Z7189 Other specified counseling: Secondary | ICD-10-CM | POA: Diagnosis not present

## 2019-05-09 DIAGNOSIS — J449 Chronic obstructive pulmonary disease, unspecified: Secondary | ICD-10-CM

## 2019-05-09 DIAGNOSIS — I2510 Atherosclerotic heart disease of native coronary artery without angina pectoris: Secondary | ICD-10-CM | POA: Diagnosis not present

## 2019-05-09 DIAGNOSIS — I480 Paroxysmal atrial fibrillation: Secondary | ICD-10-CM | POA: Diagnosis not present

## 2019-05-09 NOTE — Patient Instructions (Signed)
Medication Instructions:  Your physician recommends that you continue on your current medications as directed. Please refer to the Current Medication list given to you today.  If you need a refill on your cardiac medications before your next appointment, please call your pharmacy.   Lab work: None If you have labs (blood work) drawn today and your tests are completely normal, you will receive your results only by: . MyChart Message (if you have MyChart) OR . A paper copy in the mail If you have any lab test that is abnormal or we need to change your treatment, we will call you to review the results.  Testing/Procedures: None  Follow-Up: At [facility], you and your health needs are our priority.  As part of our continuing mission to provide you with exceptional heart care, we have created designated Provider Care Teams.  These Care Teams include your primary Cardiologist (physician) and Advanced Practice Providers (APPs -  Physician Assistants and Nurse Practitioners) who all work together to provide you with the care you need, when you need it. You will need a follow up appointment in 3 months.  Please call our office 2 months in advance to schedule this appointment.  You may see Henry W Smith III, MD or one of the following Advanced Practice Providers on your designated Care Team:   Lori Gerhardt, NP Laura Ingold, NP . Jill McDaniel, NP  Any Other Special Instructions Will Be Listed Below (If Applicable).    

## 2019-05-09 NOTE — Progress Notes (Signed)
Cardiology Office Note:    Date:  05/09/2019   ID:  Mary [name], DOB [birth year], MRN [id number]  PCP:  Hoyt Koch, MD  Cardiologist:  Sinclair Grooms, MD   Referring MD: Hoyt Koch, *   Chief Complaint  Patient presents with  . Congestive Heart Failure  . Coronary Artery Disease  . Atrial Fibrillation    History of Present Illness:    Mary [name] is a 78 y.o. 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  Her breathing is been stable since an adjustment in diuretic therapy switching to torsemide from furosemide.  She now takes 40 a.m. and 20 p.m.  She is not weighing herself regularly.  She denies angina.  For several years she has been unable to sleep in her bed.  Her weight has decreased from 252 lbs in 08/2018 down to 236 pounds.    Past Medical History:  Diagnosis Date  . Arthritis   . Atrial tachycardia ([place]) 04/23/2017  . CAD (coronary artery disease)    non obstructive  . 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 Procedure complicated by an atrial lead dislodgment and repeat procedure, 12/2013   . Carotid stenosis    Carotid US 5/16:  Bilateral ICA 1-39%; L vertebral retrograde; L BP 126/49, R BP 140/57  . Chronic combined systolic and diastolic CHF (congestive heart failure) ([place])   . Chronic lower back pain   . Colon cancer ([place])    a. s/p chemo  . Colon polyps   . Diabetic peripheral neuropathy ([place])   . DVT (deep venous thrombosis) ([place]) 2013   "twice behind knee on left side" (06/25/2013)  . Glaucoma   . Hyperlipidemia   . Hypertension   . Hypothyroidism   . Iron deficiency anemia   . Multinodular goiter   . Osteoporosis   . PAF (paroxysmal atrial fibrillation) ([place])   . Sleep apnea    a. resolved  post weight loss   . Type II diabetes mellitus ([place])     Past Surgical History:  Procedure Laterality Date  . CARDIAC CATHETERIZATION  2006   Never had PCI, 3 caths total. Last one in [place]  . CARDIAC CATHETERIZATION N/A 02/04/2015   Procedure: Left Heart Cath and Coronary Angiography;  Surgeon: Belva Crome, MD;  Location: [facility];  Service: Cardiovascular;  Laterality: N/A;  . CATARACT EXTRACTION, BILATERAL Bilateral 2013   "and put stent in my left eye for glaucoma" (06/25/2013)  . CHOLECYSTECTOMY  1980's  . COLECTOMY  10/2010   Tumor removal  . EYE MUSCLE SURGERY Bilateral ~ 1963   "muscles too long; eyes would go out and up; tied muscles to hold my eyes straight" (06/25/2013)  . LEAD REVISION  01-03-2014   atrial lead revision by Dr Rayann Heman  . LEAD REVISION N/A 01/03/2014   Procedure: LEAD REVISION;  Surgeon: Coralyn Mark, MD;  Location: [facility];  Service: Cardiovascular;  Laterality: N/A;  . LEFT HEART CATH AND CORONARY ANGIOGRAPHY N/A 05/29/2018   Procedure: LEFT HEART CATH AND CORONARY ANGIOGRAPHY;  Surgeon: Belva Crome, MD;  Location: [facility];  Service: Cardiovascular;  Laterality: N/A;  . PACEMAKER INSERTION  01/02/2014   STJ Assurity dual chamber pacemaker implnated  by Dr Lovena Le for SSS  . PERMANENT PACEMAKER INSERTION N/A 01/02/2014   Procedure: PERMANENT PACEMAKER INSERTION;  Surgeon: Evans Lance, MD;  Location[facility];  Service: Cardiovascular;  Laterality: N/A;  . PORTACATH PLACEMENT Left 11/2010  . ROUX-EN-Y GASTRIC BYPASS  2009  . ULNAR TUNNEL RELEASE Left ~ 2008  . UMBILICAL HERNIA REPAIR  1970's?    (06/25/2013)    Current Medications: Current Meds  Medication Sig  . albuterol (PROVENTIL HFA;VENTOLIN HFA) 108 (90 Base) MCG/ACT inhaler Inhale 2 puffs into the lungs every 6 (six) hours as needed for wheezing or shortness of breath.  Marland Kitchen atorvastatin (LIPITOR) 80 MG tablet TAKE 1 TABLET(80 MG) BY MOUTH DAILY  . bimatoprost (LUMIGAN)  0.01 % SOLN Place 1 drop into the right eye at bedtime.   Marland Kitchen buPROPion (WELLBUTRIN XL) 150 MG 24 hr tablet TAKE 1 TABLET(150 MG) BY MOUTH DAILY  . ELIQUIS 5 MG TABS tablet TAKE 1 TABLET BY MOUTH TWICE A DAY  . gabapentin (NEURONTIN) 300 MG capsule Take 300 mg by mouth every evening.   Marland Kitchen HYDROcodone-acetaminophen (NORCO/VICODIN) 5-325 MG tablet Take 1 tablet by mouth every 4 (four) hours as needed for moderate pain.  . Insulin Glargine (BASAGLAR KWIKPEN) 100 UNIT/ML SOPN Inject 0.2 mLs (20 Units total) into the skin daily.  Marland Kitchen levothyroxine (SYNTHROID, LEVOTHROID) 125 MCG tablet Take 125 mcg by mouth daily before breakfast.  . metoprolol succinate (TOPROL XL) 25 MG 24 hr tablet Take 0.5 tablets (12.5 mg total) by mouth at bedtime.  . nitroGLYCERIN (NITROSTAT) 0.4 MG SL tablet Place 1 tablet (0.4 mg total) under the tongue every 5 (five) minutes as needed for chest pain.  . potassium chloride SA (K-DUR) 20 MEQ tablet Take 2 tablets (40 mEq total) by mouth 2 (two) times daily.  Marland Kitchen spironolactone (ALDACTONE) 25 MG tablet Take 0.5 tablets (12.5 mg total) by mouth daily.  Marland Kitchen torsemide (DEMADEX) 20 MG tablet Take 1 tablet (20 mg total) by mouth 2 (two) times daily.     Allergies:   Other, Adhesive [tape], Aspirin, Latex, and Nsaids   Social History   Socioeconomic History  . Marital status: Married    Spouse name: [name]  . Number of children: 0  . Years of education: Not on file  . Highest education level: Not on file  Occupational History  . Occupation: Retired    Comment: office work  Scientific laboratory technician  . Financial resource strain: Not very hard  . Food insecurity    Worry: Never true    Inability: Never true  . Transportation needs    Medical: No    Non-medical: No  Tobacco Use  . Smoking status: Former Smoker    Packs/day: 1.00    Years: 30.00    Pack years: 30.00    Types: Cigarettes    Quit date: 02/2017    Years since quitting: 2.1  . Smokeless tobacco: Never Used  . Tobacco  comment: started in her [age] until 2009   Substance and Sexual Activity  . Alcohol use: Not Currently    Alcohol/week: 0.0 standard drinks  . Drug use: No  . Sexual activity: Yes  Lifestyle  . Physical activity    Days per week: 0 days    Minutes per session: 0 min  . Stress: Only a little  Relationships  . Social connections    Talks on phone: More than three times a week    Gets together: More than three times a week  Attends religious service: More than 4 times per year    Active member of club or organization: Yes    Attends meetings of clubs or organizations: More than 4 times per year    Relationship status: Married  Other Topics Concern  . Not on file  Social History Narrative   Married to husband, [name]   No children   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   Recently moved back here from [place]      Family History: The patient's family history includes Bladder Cancer in her brother and cousin; Breast cancer in her maternal aunt, mother, and sister; Clotting disorder in her sister; Colon polyps in her brother and brother; Diabetes in her maternal grandmother, mother, and sister; Heart attack in her brother, father, and sister; Heart disease in her father; Pancreatic cancer in her brother; Prostate cancer in her brother.  ROS:   Please see the history of present illness.    Still smoking cigarettes.  Diet has been poor.  Argumentative about stopping smoking.  Her husband, [name], is pretty much the same way all other systems reviewed and are negative.  EKGs/Labs/Other Studies Reviewed:    The following studies were reviewed today:  2D Doppler echocardiogram 05/26/2018: Study Conclusions  - Left ventricle: Systolic function was normal. The estimated   ejection fraction was in the range of 55% to 60%. There is   hypokinesis of the apical septal myocardium.  Impressions:  - Limited study with definity to evaluate wall motion;  akinesis of   the distal septum with overall preserved LV function.   EKG:  EKG performed on 02/14/2019, AV sequential pacing is noted.  Recent Labs: 02/08/2019: ALT 10; Pro B Natriuretic peptide (BNP) 166.0 02/14/2019: B Natriuretic Peptide 84.2; Hemoglobin 11.0; Platelets 358 04/02/2019: BUN 20; Creatinine, Ser 1.15; Potassium 4.8; Sodium 135  Recent Lipid Panel    Component Value Date/Time   CHOL 124 05/25/2018 0531   TRIG 81 05/25/2018 0531   HDL 35 (L) 05/25/2018 0531   CHOLHDL 3.5 05/25/2018 0531   VLDL 16 05/25/2018 0531   LDLCALC 73 05/25/2018 0531   LDLDIRECT 79.0 03/23/2016 1529    Physical Exam:    VS:  BP (!) 142/60   Pulse 72   Ht 5\' 7"  (1.702 m)   Wt 236 lb (107 kg)   SpO2 93%   BMI 36.96 kg/m     Wt Readings from Last 3 Encounters:  05/09/19 236 lb (107 kg)  03/18/19 238 lb 1.6 oz (108 kg)  02/28/19 238 lb (108 kg)     GEN: Moderate obesity. No acute distress HEENT: Normal NECK: No JVD. LYMPHATICS: No lymphadenopathy CARDIAC:  RRR without murmur, gallop, or edema. VASCULAR:  Normal Pulses. No bruits. RESPIRATORY:  Clear to auscultation without rales, wheezing or rhonchi  ABDOMEN: Soft, non-tender, non-distended, No pulsatile mass, MUSCULOSKELETAL: No deformity  SKIN: Warm and dry NEUROLOGIC:  Alert and oriented x 3 PSYCHIATRIC:  Normal affect   ASSESSMENT:    1. Chronic diastolic heart failure ([place])   2. Essential hypertension   3. Coronary artery disease of native artery of native heart with stable angina pectoris ([place])   4. PAF (paroxysmal atrial fibrillation) ([place])   5. Morbid obesity ([place])   6. Chronic obstructive pulmonary disease, unspecified COPD type ([place])   7. Chronic anticoagulation   8. Tobacco abuse   9. Educated About Covid-19 Virus Infection    PLAN:    In order of problems  listed above:  1. No evidence of volume overload and in fact since January her weight is down nearly 20 pounds.  Torsemide 40 a.m. and 20 p.m. seems to be  holding her volume stable.  I have encouraged her to weigh daily under the same circumstances on the same set of scales.  A 3 pound increase overnight or 5 pounds in 1 week should prompt a phone call to allow preventive action to avoid hospitalization. 2. 130/80 is ideal blood pressure.  Today's blood pressure is reasonable.  No change in therapy at this time.  Low-salt diet is encouraged as much as possible. 3. Secondary prevention is discussed. 4. Continue current therapy to control atrial arrhythmia.  Apixaban will be used to avoid thromboembolic stroke. 5. Not specifically addressed other than encouraging activity and decreasing caloric intake. 6. I encouraged the patient to stop smoking as always.  She gets a cranky attitude when you talk about smoking cessation.  Her husband is not helping.  They cannot both agree to discontinue smoking, so they continue smoking. 7. Continue Eliquis twice daily. 8. Discussed above under #6. 9. Social distancing, mask wearing, and handwashing is stressed.  Overall education and awareness concerning primary/secondary risk prevention was discussed in detail: LDL less than 70, hemoglobin A1c less than 7, blood pressure target less than 130/80 mmHg, >150 minutes of moderate aerobic activity per week, avoidance of smoking, weight control (via diet and exercise), and continued surveillance/management of/for obstructive sleep apnea.    Medication Adjustments/Labs and Tests Ordered: Current medicines are reviewed at length with the patient today.  Concerns regarding medicines are outlined above.  No orders of the defined types were placed in this encounter.  No orders of the defined types were placed in this encounter.   Patient Instructions  Medication Instructions:  Your physician recommends that you continue on your current medications as directed. Please refer to the Current Medication list given to you today.  If you need a refill on your cardiac  medications before your next appointment, please call your pharmacy.   Lab work: None If you have labs (blood work) drawn today and your tests are completely normal, you will receive your results only by: Marland Kitchen MyChart Message (if you have MyChart) OR . A paper copy in the mail If you have any lab test that is abnormal or we need to change your treatment, we will call you to review the results.  Testing/Procedures: None  Follow-Up: At [facility], you and your health needs are our priority.  As part of our continuing mission to provide you with exceptional heart care, we have created designated Provider Care Teams.  These Care Teams include your primary Cardiologist (physician) and Advanced Practice Providers (APPs -  Physician Assistants and Nurse Practitioners) who all work together to provide you with the care you need, when you need it. You will need a follow up appointment in 3 months.  Please call our office 2 months in advance to schedule this appointment.  You may see Sinclair Grooms, MD or one of the following Advanced Practice Providers on your designated Care Team:   Truitt Merle, NP Cecilie Kicks, NP . Kathyrn Drown, NP  Any Other Special Instructions Will Be Listed Below (If Applicable).       Signed, Sinclair Grooms, MD  05/09/2019 5:29 PM    [facility]

## 2019-05-22 NOTE — Progress Notes (Signed)
Subjective:   Mary [name] is a 78 y.o. female who presents for Medicare Annual (Subsequent) preventive examination.  Review of Systems:   Cardiac Risk Factors include: diabetes mellitus;advanced age (>1men, >64 women);dyslipidemia;hypertension Sleep patterns: feels rested on waking, gets up 1-2 times nightly to void and sleeps 6 hours nightly. Wears C-PAP  Home Safety/Smoke Alarms: Feels safe in home. Smoke alarms in place.  Living environment; residence and Adult nurse: apartment, equipment: Radio producer, Type: [place], [place], Type: Conservation officer, nature, [employer], Type: Tub Therapist, nutritional. Lives with husband, no needs for DME, very limited support system Seat Belt Safety/Bike Helmet: Wears seat belt.      Objective:     Vitals: BP 123/64    Pulse 72    Resp 19    Ht 5\' 7"  (1.702 m)    Wt 227 lb (103 kg)    SpO2 97%    BMI 35.55 kg/m   Body mass index is 35.55 kg/m.  Advanced Directives 05/23/2019 02/14/2019 05/24/2018 05/23/2018 05/22/2018 05/21/2018 12/20/2017  Does Patient Have a Medical Advance Directive? No Yes - No No No No  Does patient want to make changes to medical advance directive? No - Patient declined - - - - - -  Would patient like information on creating a medical advance directive? - - No - Patient declined - No - Patient declined No - Patient declined -  Pre-existing out of facility DNR order (yellow form or pink MOST form) - - - - - - -    Tobacco Social History   Tobacco Use  Smoking Status Former Smoker   Packs/day: 1.00   Years: 30.00   Pack years: 30.00   Types: Cigarettes   Quit date: 02/2017   Years since quitting: 2.2  Smokeless Tobacco Never Used  Tobacco Comment   started in her [age] until 2009      Counseling given: Not Answered Comment: started in her twenty until 2009   Past Medical History:  Diagnosis Date   Arthritis    Atrial tachycardia ([place]) 04/23/2017   CAD (coronary artery disease)    non  obstructive   Cardiac pacemaker in situ - St Jude 05/13/2014   Permanent pacemaker for second-degree heart block. Procedure complicated by an atrial lead dislodgment and repeat procedure, 12/2013    Carotid stenosis    Carotid US 5/16:  Bilateral ICA 1-39%; L vertebral retrograde; L BP 126/49, R BP 140/57   Chronic combined systolic and diastolic CHF (congestive heart failure) (HCC)    Chronic lower back pain    Colon cancer (HCC)    a. s/p chemo   Colon polyps    Diabetic peripheral neuropathy (HCC)    DVT (deep venous thrombosis) ([place]) 2013   "twice behind knee on left side" (06/25/2013)   Glaucoma    Hyperlipidemia    Hypertension    Hypothyroidism    Iron deficiency anemia    Multinodular goiter    Osteoporosis    PAF (paroxysmal atrial fibrillation) (HCC)    Sleep apnea    a. resolved post weight loss    Type II diabetes mellitus ([place])    Past Surgical History:  Procedure Laterality Date   CARDIAC CATHETERIZATION  2006   Never had PCI, 3 caths total. Last one in [place] N/A 02/04/2015   Procedure: Left Heart Cath and Coronary Angiography;  Surgeon: Belva Crome, MD;  Location: [facility];  Service: Cardiovascular;  Laterality: N/A;   CATARACT EXTRACTION, BILATERAL Bilateral 2013   "and put stent in my left eye for glaucoma" (06/25/2013)   CHOLECYSTECTOMY  1980's   COLECTOMY  10/2010   Tumor removal   EYE MUSCLE SURGERY Bilateral ~ 1963   "muscles too long; eyes would go out and up; tied muscles to hold my eyes straight" (06/25/2013)   LEAD REVISION  01-03-2014   atrial lead revision by Dr Rayann Heman   LEAD REVISION N/A 01/03/2014   Procedure: LEAD REVISION;  Surgeon: Coralyn Mark, MD;  [facility];  Service: Cardiovascular;  Laterality: N/A;   LEFT HEART CATH AND CORONARY ANGIOGRAPHY N/A 05/29/2018   Procedure: LEFT HEART CATH AND CORONARY ANGIOGRAPHY;  Surgeon: Belva Crome, MD;  Location: [facility];  Service: Cardiovascular;  Laterality: N/A;   PACEMAKER INSERTION  01/02/2014   STJ Assurity dual chamber pacemaker implnated by Dr Lovena Le for SSS   PERMANENT PACEMAKER INSERTION N/A 01/02/2014   Procedure: PERMANENT PACEMAKER INSERTION;  Surgeon: Evans Lance, MD;  Location: [facility] CATH LAB;  Service: Cardiovascular;  Laterality: N/A;   PORTACATH PLACEMENT Left 11/2010   ROUX-EN-Y GASTRIC BYPASS  2009   ULNAR TUNNEL RELEASE Left ~ [id number]   UMBILICAL HERNIA REPAIR  1970's?    (06/25/2013)   Family History  Problem Relation Age of Onset   Breast cancer Mother    Diabetes Mother    Heart disease Father    Heart attack Father    Heart attack Sister    Heart attack Brother    Bladder Cancer Brother    Prostate cancer Brother    Colon polyps Brother    Breast cancer Sister    Breast cancer Maternal Aunt        x 2 aunts   Pancreatic cancer Brother    Colon polyps Brother    Bladder Cancer Cousin    Clotting disorder Sister    Diabetes Sister        x 3   Diabetes Maternal Grandmother    Social History   Socioeconomic History   Marital status: Married    Spouse name: [name]   Number of children: 0   Years of education: Not on file   Highest education level: Not on file  Occupational History   Occupation: Retired    Comment: office work  Scientist, product/process development strain: Not very hard   Food insecurity    Worry: Never true    Inability: Never true   Transportation needs    Medical: No    Non-medical: No  Tobacco Use   Smoking status: Former Smoker    Packs/day: 1.00    Years: 30.00    Pack years: 30.00    Types: Cigarettes    Quit date: 02/2017    Years since quitting: 2.2   Smokeless tobacco: Never Used   Tobacco comment: started in her [age] until 2009   Substance and Sexual Activity   Alcohol use: Not Currently    Alcohol/week: 0.0 standard drinks   Drug use: No   Sexual activity: Yes  Lifestyle   Physical  activity    Days per week: 0 days    Minutes per session: 0 min   Stress: Not at all  Relationships   Social connections    Talks on phone: More than three times a week    Gets together: More than three times a week    Attends religious service: More than  4 times per year    Active member of club or organization: Yes    Attends meetings of clubs or organizations: More than 4 times per year    Relationship status: Married  Other Topics Concern   Not on file  Social History Narrative   Married to husband, [name]   No children   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   Recently moved back here from [place]     Outpatient Encounter Medications as of 05/23/2019  Medication Sig   albuterol (PROVENTIL HFA;VENTOLIN HFA) 108 (90 Base) MCG/ACT inhaler Inhale 2 puffs into the lungs every 6 (six) hours as needed for wheezing or shortness of breath.   atorvastatin (LIPITOR) 80 MG tablet TAKE 1 TABLET(80 MG) BY MOUTH DAILY   bimatoprost (LUMIGAN) 0.01 % SOLN Place 1 drop into the right eye at bedtime.    buPROPion (WELLBUTRIN XL) 150 MG 24 hr tablet TAKE 1 TABLET(150 MG) BY MOUTH DAILY   ELIQUIS 5 MG TABS tablet TAKE 1 TABLET BY MOUTH TWICE A DAY   gabapentin (NEURONTIN) 300 MG capsule Take 300 mg by mouth every evening.    HYDROcodone-acetaminophen (NORCO/VICODIN) 5-325 MG tablet Take 1 tablet by mouth every 4 (four) hours as needed for moderate pain.   Insulin Glargine (BASAGLAR KWIKPEN) 100 UNIT/ML SOPN Inject 0.2 mLs (20 Units total) into the skin daily.   levothyroxine (SYNTHROID, LEVOTHROID) 125 MCG tablet Take 125 mcg by mouth daily before breakfast.   metoprolol succinate (TOPROL XL) 25 MG 24 hr tablet Take 0.5 tablets (12.5 mg total) by mouth at bedtime.   nitroGLYCERIN (NITROSTAT) 0.4 MG SL tablet Place 1 tablet (0.4 mg total) under the tongue every 5 (five) minutes as needed for chest pain.   potassium chloride SA (K-DUR) 20 MEQ tablet Take 2  tablets (40 mEq total) by mouth 2 (two) times daily.   torsemide (DEMADEX) 20 MG tablet Take 1 tablet (20 mg total) by mouth 2 (two) times daily. (Patient taking differently: Take 20 mg by mouth 3 (three) times daily. )   spironolactone (ALDACTONE) 25 MG tablet Take 0.5 tablets (12.5 mg total) by mouth daily.   No facility-administered encounter medications on file as of 05/23/2019.     Activities of Daily Living In your present state of health, do you have any difficulty performing the following activities: 05/23/2019 05/24/2018  Hearing? N -  Vision? N -  Difficulty concentrating or making decisions? N -  Walking or climbing stairs? N -  Dressing or bathing? N -  Doing errands, shopping? N N  Preparing Food and eating ? N -  Using the Toilet? N -  In the past six months, have you accidently leaked urine? N -  Do you have problems with loss of bowel control? N -  Managing your Medications? N -  Managing your Finances? N -  Housekeeping or managing your Housekeeping? N -  Some recent data might be hidden    Patient Care Team: Hoyt Koch, MD as PCP - General (Internal Medicine) Belva Crome, MD as PCP - Cardiology (Cardiology) [name], MD as PCP - Electrophysiology (Cardiology) Belva Crome, MD as Consulting Physician (Cardiology) Roel Cluck, MD as Referring Physician (Ophthalmology) Irene Shipper, MD as Consulting Physician (Gastroenterology) Delrae Rend, MD as Consulting Physician (Endocrinology)    Assessment:   This is a routine wellness examination for [place]. Physical assessment deferred to PCP.  Exercise Activities and Dietary recommendations Current Exercise  Habits: The patient does not participate in regular exercise at present, Exercise limited by: orthopedic condition(s);cardiac condition(s) Discussed doing chair exercises and A[employer]Y senior TV exercise program resource provided.   Diet (meal preparation, eat out, water intake, caffeinated  beverages, dairy products, fruits and vegetables): in general, a "healthy" diet   Reports poor appetite at times. Is on fluid restriction due to CHF.   Reviewed heart healthy and diabetic diet.  Discussed supplementing with Ensure pudding and coupons were provided.    Goals     Patient Stated     Work towards lowering my blood sugar and Hgb A1c, by monitoring sugar and carbohydrates, begin chair exercises, stay as healthy and as independent as possible.       Fall Risk Fall Risk  05/23/2019 05/23/2019 12/01/2017 08/04/2017 08/10/2016  Falls in the past year? 1 1 Yes Yes Yes  Comment - - - Emmi Telephone Survey: data to providers prior to load -  Number falls in past yr: 1 1 2  or more 2 or more 2 or more  Comment - - - Emmi Telephone Survey Actual Response = 8 -  Injury with Fall? 1 1 - No No  Risk Factor Category  - - High Fall Risk - -  Risk for fall due to : Impaired mobility;Impaired balance/gait Impaired balance/gait;Impaired mobility Impaired mobility;Impaired balance/gait - -  Follow up Falls prevention discussed;Education provided Education provided;Falls prevention discussed - - -   Is the patient's home free of loose throw rugs in walkways, pet beds, electrical cords, etc?   yes      Grab bars in the bathroom? yes      Handrails on the stairs?   yes      Adequate lighting?   yes  Depression Screen PHQ 2/9 Scores 05/23/2019 02/08/2019 12/01/2017 08/10/2016  PHQ - 2 Score 1 1 0 0  PHQ- 9 Score 3 8 1  -     Cognitive Function MMSE - Mini Mental State Exam 12/01/2017  Orientation to time 5  Orientation to Place 5  Registration 3  Attention/ Calculation 5  Recall 2  Language- name 2 objects 2  Language- repeat 1  Language- follow 3 step command 3  Language- read & follow direction 1  Write a sentence 1  Copy design 1  Total score 29       Ad8 score reviewed for issues:  Issues making decisions: no  Less interest in hobbies / activities: no  Repeats questions, stories  (family complaining): no  Trouble using ordinary gadgets (microwave, computer, phone):no  Forgets the month or year: no  Mismanaging finances: no  Remembering appts: no  Daily problems with thinking and/or memory: no Ad8 score is= 0  Immunization History  Administered Date(s) Administered   Fluad Quad(high Dose 65+) 05/23/2019   Influenza, High Dose Seasonal PF 09/12/2014, 08/10/2016, 05/23/2017, 05/25/2018   Influenza,inj,Quad PF,6+ Mos 06/26/2013, 08/04/2015   Pneumococcal Conjugate-13 09/12/2014   Pneumococcal-Unspecified 05/05/2011   Tdap 03/18/2019   Screening Tests Health Maintenance  Topic Date Due   OPHTHALMOLOGY EXAM  12/04/1950   URINE MICROALBUMIN  12/04/1950   HEMOGLOBIN A1C  08/10/2019   FOOT EXAM  05/22/2020   COLONOSCOPY  12/21/2022   TETANUS/TDAP  03/17/2029   INFLUENZA VACCINE  Completed   PNA vac Low Risk Adult  Completed   DEXA SCAN  Addressed      Plan:    Reviewed health maintenance screenings with patient today and relevant education, vaccines, and/or referrals were provided.  I have personally reviewed and noted the following in the patients chart:    Medical and social history  Use of alcohol, tobacco or illicit drugs   Current medications and supplements  Functional ability and status  Nutritional status  Physical activity  Advanced directives  List of other physicians  Vitals  Screenings to include cognitive, depression, and falls  Referrals and appointments  In addition, I have reviewed and discussed with patient certain preventive protocols, quality metrics, and best practice recommendations. A written personalized care plan for preventive services as well as general preventive health recommendations were provided to patient.     Michiel Cowboy, RN  05/23/2019

## 2019-05-23 ENCOUNTER — Other Ambulatory Visit: Payer: Self-pay

## 2019-05-23 ENCOUNTER — Ambulatory Visit (INDEPENDENT_AMBULATORY_CARE_PROVIDER_SITE_OTHER): Payer: Medicare Other | Admitting: *Deleted

## 2019-05-23 VITALS — BP 123/64 | HR 72 | Resp 19 | Ht 67.0 in | Wt 227.0 lb

## 2019-05-23 DIAGNOSIS — E118 Type 2 diabetes mellitus with unspecified complications: Secondary | ICD-10-CM

## 2019-05-23 DIAGNOSIS — Z23 Encounter for immunization: Secondary | ICD-10-CM | POA: Diagnosis not present

## 2019-05-23 DIAGNOSIS — Z0000 Encounter for general adult medical examination without abnormal findings: Secondary | ICD-10-CM

## 2019-05-23 NOTE — Patient Instructions (Addendum)
Continue doing brain stimulating activities (puzzles, reading, adult coloring books, staying active) to keep memory sharp.   Continue to eat heart healthy diet (full of fruits, vegetables, whole grains, lean protein, water--limit salt, fat, and sugar intake) and increase physical activity as tolerated.  If you cannot attend class in person, you can still exercise at home. Video taped versions of A[employer] classes are shown on [employer] (GTN) at 8 am and 1 pm Mondays through Fridays. You can also purchase a copy of the AHOY DVD by calling [place] (GTN) [employer]. GTN is available on [employer] channel 13 with a digital cable box and on [employer] channel 31. GTN is also available on [employer], channel 99. To view GTN, go to channel 99, press OK, select [place], then select GTN to start the channel.   Mary [name] , Thank you for taking time to come for your Medicare Wellness Visit. I appreciate your ongoing commitment to your health goals. Please review the following plan we discussed and let me know if I can assist you in the future.   These are the goals we discussed: Goals    . Patient Stated     Work towards lowering my blood sugar and Hgb A1c, by monitoring sugar and carbohydrates, begin chair exercises, stay as healthy and as independent as possible.       This is a list of the screening recommended for you and due dates:  Health Maintenance  Topic Date Due  . Eye exam for diabetics  12/04/1950  . Urine Protein Check  12/04/1950  . Complete foot exam   03/23/2017  . Flu Shot  03/31/2019  . Hemoglobin A1C  08/10/2019  . Colon Cancer Screening  12/21/2022  . Tetanus Vaccine  03/17/2029  . Pneumonia vaccines  Completed  . DEXA scan (bone density measurement)  Addressed    Preventive Care 31 Years and Older, Female Preventive care refers to lifestyle choices and visits with your health care provider that can promote health and  wellness. This includes:  A yearly physical exam. This is also called an annual well check.  Regular dental and eye exams.  Immunizations.  Screening for certain conditions.  Healthy lifestyle choices, such as diet and exercise. What can I expect for my preventive care visit? Physical exam Your health care provider will check:  Height and weight. These may be used to calculate body mass index (BMI), which is a measurement that tells if you are at a healthy weight.  Heart rate and blood pressure.  Your skin for abnormal spots. Counseling Your health care provider may ask you questions about:  Alcohol, tobacco, and drug use.  Emotional well-being.  Home and relationship well-being.  Sexual activity.  Eating habits.  History of falls.  Memory and ability to understand (cognition).  Work and work Statistician.  Pregnancy and menstrual history. What immunizations do I need?  Influenza (flu) vaccine  This is recommended every year. Tetanus, diphtheria, and pertussis (Tdap) vaccine  You may need a Td booster every 10 years. Varicella (chickenpox) vaccine  You may need this vaccine if you have not already been vaccinated. Zoster (shingles) vaccine  You may need this after age 4. Pneumococcal conjugate (PCV13) vaccine  One dose is recommended after age 36. Pneumococcal polysaccharide (PPSV23) vaccine  One dose is recommended after age 65. Measles, mumps, and rubella (MMR) vaccine  You may need at least one dose of MMR if you were born in 1957 or  later. You may also need a second dose. Meningococcal conjugate (MenACWY) vaccine  You may need this if you have certain conditions. Hepatitis A vaccine  You may need this if you have certain conditions or if you travel or work in places where you may be exposed to hepatitis A. Hepatitis B vaccine  You may need this if you have certain conditions or if you travel or work in places where you may be exposed to  hepatitis B. Haemophilus influenzae type b (Hib) vaccine  You may need this if you have certain conditions. You may receive vaccines as individual doses or as more than one vaccine together in one shot (combination vaccines). Talk with your health care provider about the risks and benefits of combination vaccines. What tests do I need? Blood tests  Lipid and cholesterol levels. These may be checked every 5 years, or more frequently depending on your overall health.  Hepatitis C test.  Hepatitis B test. Screening  Lung cancer screening. You may have this screening every year starting at age 80 if you have a 30-pack-year history of smoking and currently smoke or have quit within the past 15 years.  Colorectal cancer screening. All adults should have this screening starting at age 67 and continuing until age 76. Your health care provider may recommend screening at age 56 if you are at increased risk. You will have tests every 1-10 years, depending on your results and the type of screening test.  Diabetes screening. This is done by checking your blood sugar (glucose) after you have not eaten for a while (fasting). You may have this done every 1-3 years.  Mammogram. This may be done every 1-2 years. Talk with your health care provider about how often you should have regular mammograms.  BRCA-related cancer screening. This may be done if you have a family history of breast, ovarian, tubal, or peritoneal cancers. Other tests  Sexually transmitted disease (STD) testing.  Bone density scan. This is done to screen for osteoporosis. You may have this done starting at age 19. Follow these instructions at home: Eating and drinking  Eat a diet that includes fresh fruits and vegetables, whole grains, lean protein, and low-fat dairy products. Limit your intake of foods with high amounts of sugar, saturated fats, and salt.  Take vitamin and mineral supplements as recommended by your health care  provider.  Do not drink alcohol if your health care provider tells you not to drink.  If you drink alcohol: ? Limit how much you have to 0-1 drink a day. ? Be aware of how much alcohol is in your drink. In the [place] one drink equals one 12 oz bottle of beer (355 mL), one 5 oz glass of [place] Sudol (148 mL), or one 1 oz glass of hard liquor (44 mL). Lifestyle  Take daily care of your teeth and gums.  Stay active. Exercise for at least 30 minutes on 5 or more days each week.  Do not use any products that contain nicotine or tobacco, such as cigarettes, e-cigarettes, and chewing tobacco. If you need help quitting, ask your health care provider.  If you are sexually active, practice safe sex. Use a condom or other form of protection in order to prevent STIs (sexually transmitted infections).  Talk with your health care provider about taking a low-dose aspirin or statin. What's next?  Go to your health care provider once a year for a well check visit.  Ask your health care provider how often you  should have your eyes and teeth checked.  Stay up to date on all vaccines. This information is not intended to replace advice given to you by your health care provider. Make sure you discuss any questions you have with your health care provider. Document Released: 09/12/2015 Document Revised: 08/10/2018 Document Reviewed: 08/10/2018 Elsevier Patient Education  2020 [employer].

## 2019-05-24 NOTE — Progress Notes (Signed)
Medical screening examination/treatment/procedure(s) were performed by non-physician practitioner and as supervising physician I was immediately available for consultation/collaboration. I agree with above. Aracelia Brinson A Chudney Scheffler, MD 

## 2019-06-18 ENCOUNTER — Ambulatory Visit (INDEPENDENT_AMBULATORY_CARE_PROVIDER_SITE_OTHER): Payer: Medicare Other | Admitting: *Deleted

## 2019-06-18 DIAGNOSIS — I441 Atrioventricular block, second degree: Secondary | ICD-10-CM | POA: Diagnosis not present

## 2019-06-18 DIAGNOSIS — I480 Paroxysmal atrial fibrillation: Secondary | ICD-10-CM

## 2019-06-18 LAB — CUP PACEART REMOTE DEVICE CHECK
Battery Remaining Longevity: 79 mo
Battery Remaining Percentage: 89 %
Battery Voltage: 2.95 V
Brady Statistic AP VP Percent: 82 %
Brady Statistic AP VS Percent: 1 %
Brady Statistic AS VP Percent: 18 %
Brady Statistic AS VS Percent: 1 %
Brady Statistic RA Percent Paced: 81 %
Brady Statistic RV Percent Paced: 99 %
Date Time Interrogation Session: 20201019124219
Implantable Lead Implant Date: 20150506
Implantable Lead Implant Date: 20150506
Implantable Lead Location: 753859
Implantable Lead Location: 753860
Implantable Pulse Generator Implant Date: 20150506
Lead Channel Impedance Value: 350 Ohm
Lead Channel Impedance Value: 400 Ohm
Lead Channel Pacing Threshold Amplitude: 0.75 V
Lead Channel Pacing Threshold Amplitude: 0.75 V
Lead Channel Pacing Threshold Pulse Width: 0.4 ms
Lead Channel Pacing Threshold Pulse Width: 0.4 ms
Lead Channel Sensing Intrinsic Amplitude: 0.5 mV
Lead Channel Sensing Intrinsic Amplitude: 12 mV
Lead Channel Setting Pacing Amplitude: 2.5 V
Lead Channel Setting Pacing Amplitude: 2.5 V
Lead Channel Setting Pacing Pulse Width: 0.4 ms
Lead Channel Setting Sensing Sensitivity: 4 mV
Pulse Gen Model: 2240
Pulse Gen Serial Number: 3013730

## 2019-07-08 ENCOUNTER — Encounter: Payer: Self-pay | Admitting: Internal Medicine

## 2019-07-09 MED ORDER — HYDROCODONE-ACETAMINOPHEN 5-325 MG PO TABS: 1.0000 | ORAL_TABLET | ORAL | 0 refills | Status: DC | PRN

## 2019-07-09 NOTE — Progress Notes (Signed)
Remote pacemaker transmission.   

## 2019-07-26 ENCOUNTER — Encounter: Payer: Self-pay | Admitting: Internal Medicine

## 2019-07-26 DIAGNOSIS — Z20822 Contact with and (suspected) exposure to covid-19: Secondary | ICD-10-CM

## 2019-08-02 ENCOUNTER — Other Ambulatory Visit: Payer: Self-pay

## 2019-08-02 DIAGNOSIS — Z20828 Contact with and (suspected) exposure to other viral communicable diseases: Secondary | ICD-10-CM | POA: Diagnosis not present

## 2019-08-02 DIAGNOSIS — Z20822 Contact with and (suspected) exposure to covid-19: Secondary | ICD-10-CM

## 2019-08-06 LAB — NOVEL CORONAVIRUS, NAA: SARS-CoV-2, NAA: NOT DETECTED

## 2019-08-08 ENCOUNTER — Ambulatory Visit: Payer: Medicare Other | Admitting: Interventional Cardiology

## 2019-08-08 ENCOUNTER — Encounter: Payer: Self-pay | Admitting: Internal Medicine

## 2019-08-13 ENCOUNTER — Other Ambulatory Visit: Payer: Self-pay | Admitting: Nurse Practitioner

## 2019-09-16 ENCOUNTER — Other Ambulatory Visit: Payer: Self-pay | Admitting: Internal Medicine

## 2019-09-17 ENCOUNTER — Ambulatory Visit (INDEPENDENT_AMBULATORY_CARE_PROVIDER_SITE_OTHER): Payer: Medicare Other | Admitting: *Deleted

## 2019-09-17 DIAGNOSIS — I442 Atrioventricular block, complete: Secondary | ICD-10-CM | POA: Diagnosis not present

## 2019-09-17 LAB — CUP PACEART REMOTE DEVICE CHECK
Battery Remaining Longevity: 81 mo
Battery Remaining Percentage: 89 %
Battery Voltage: 2.95 V
Brady Statistic AP VP Percent: 84 %
Brady Statistic AP VS Percent: 1 %
Brady Statistic AS VP Percent: 16 %
Brady Statistic AS VS Percent: 1 %
Brady Statistic RA Percent Paced: 84 %
Brady Statistic RV Percent Paced: 99 %
Date Time Interrogation Session: 20210118040014
Implantable Lead Implant Date: 20150506
Implantable Lead Implant Date: 20150506
Implantable Lead Location: 753859
Implantable Lead Location: 753860
Implantable Pulse Generator Implant Date: 20150506
Lead Channel Impedance Value: 380 Ohm
Lead Channel Impedance Value: 440 Ohm
Lead Channel Pacing Threshold Amplitude: 0.75 V
Lead Channel Pacing Threshold Amplitude: 0.75 V
Lead Channel Pacing Threshold Pulse Width: 0.4 ms
Lead Channel Pacing Threshold Pulse Width: 0.4 ms
Lead Channel Sensing Intrinsic Amplitude: 0.5 mV
Lead Channel Sensing Intrinsic Amplitude: 12 mV
Lead Channel Setting Pacing Amplitude: 2.5 V
Lead Channel Setting Pacing Amplitude: 2.5 V
Lead Channel Setting Pacing Pulse Width: 0.4 ms
Lead Channel Setting Sensing Sensitivity: 4 mV
Pulse Gen Model: 2240
Pulse Gen Serial Number: 3013730

## 2019-09-18 NOTE — Progress Notes (Signed)
PPM Remote  

## 2019-09-19 ENCOUNTER — Ambulatory Visit: Payer: Medicare Other | Attending: Internal Medicine

## 2019-09-19 DIAGNOSIS — Z23 Encounter for immunization: Secondary | ICD-10-CM | POA: Diagnosis not present

## 2019-09-19 NOTE — Progress Notes (Signed)
   Covid-19 Vaccination Clinic  Name:  Mary [name]    MRN: [id number] DOB: [birth year]  09/19/2019  [name] was observed post Covid-19 immunization for 15 minutes without incidence. She was provided with Vaccine Information Sheet and instruction to access the V-Safe system.   [name] was instructed to call 911 with any severe reactions post vaccine: Marland Kitchen Difficulty breathing  . Swelling of your face and throat  . A fast heartbeat  . A bad rash all over your body  . Dizziness and weakness    Immunizations Administered    Name Date Dose VIS Date Route   Pfizer COVID-19 Vaccine 09/19/2019  2:18 PM 0.3 mL 08/10/2019 Intramuscular   Manufacturer: [place]   Lot: [id number]   [place]: [id number]

## 2019-09-24 DIAGNOSIS — I509 Heart failure, unspecified: Secondary | ICD-10-CM | POA: Diagnosis not present

## 2019-09-24 DIAGNOSIS — E039 Hypothyroidism, unspecified: Secondary | ICD-10-CM | POA: Diagnosis not present

## 2019-09-24 DIAGNOSIS — E049 Nontoxic goiter, unspecified: Secondary | ICD-10-CM | POA: Diagnosis not present

## 2019-09-24 DIAGNOSIS — Z794 Long term (current) use of insulin: Secondary | ICD-10-CM | POA: Diagnosis not present

## 2019-09-24 DIAGNOSIS — E1142 Type 2 diabetes mellitus with diabetic polyneuropathy: Secondary | ICD-10-CM | POA: Diagnosis not present

## 2019-10-04 ENCOUNTER — Other Ambulatory Visit: Payer: Self-pay

## 2019-10-04 ENCOUNTER — Encounter: Payer: Self-pay | Admitting: Internal Medicine

## 2019-10-04 ENCOUNTER — Ambulatory Visit (INDEPENDENT_AMBULATORY_CARE_PROVIDER_SITE_OTHER): Payer: Medicare Other | Admitting: Internal Medicine

## 2019-10-04 DIAGNOSIS — M25531 Pain in right wrist: Secondary | ICD-10-CM

## 2019-10-04 MED ORDER — PREDNISONE 20 MG PO TABS: 40.0000 mg | ORAL_TABLET | Freq: Every day | ORAL | 0 refills | Status: DC

## 2019-10-04 NOTE — Progress Notes (Signed)
   Subjective:   Patient ID: Mary [name], female    DOB: July 05, 1941, 79 y.o.   MRN: [id number]  HPI The patient is a 79 YO female coming in for concerns about swelling in her right wrist area. Started about 1 week ago. Hurting quite a lot. Denies injury to the area. Does use her hands quite a lot on computer etc. Did have what looked like a red streak going up her arm so she wanted to get this evaluated. She has not tried anything for this as she was not sure what she could take. Denies fevers or chills. Denies weakness or numbness in her hand. Has been using salon pas which does help and ice helps.   Review of Systems  Constitutional: Negative.   HENT: Negative.   Eyes: Negative.   Respiratory: Negative for cough, chest tightness and shortness of breath.   Cardiovascular: Negative for chest pain, palpitations and leg swelling.  Gastrointestinal: Negative for abdominal distention, abdominal pain, constipation, diarrhea, nausea and vomiting.  Musculoskeletal: Positive for joint swelling and myalgias.  Skin: Negative.   Neurological: Negative.   Psychiatric/Behavioral: Negative.     Objective:  Physical Exam Constitutional:      Appearance: She is well-developed. She is obese.  HENT:     Head: Normocephalic and atraumatic.  Cardiovascular:     Rate and Rhythm: Normal rate and regular rhythm.  Pulmonary:     Effort: Pulmonary effort is normal. No respiratory distress.     Breath sounds: Normal breath sounds. No wheezing or rales.  Abdominal:     General: Bowel sounds are normal. There is no distension.     Palpations: Abdomen is soft.     Tenderness: There is no abdominal tenderness. There is no rebound.  Musculoskeletal:        General: Tenderness present.     Cervical back: Normal range of motion.     Comments: Tenderness/soft tissue swelling right radial aspect wrist, no rash or red tracking marks appreciated, tender to touch over the swollen aspect  Skin:    General: Skin is  warm and dry.  Neurological:     Mental Status: She is alert and oriented to person, place, and time.     Coordination: Coordination normal.     Vitals:   10/04/19 1351  BP: 126/82  Pulse: 73  Temp: 97.9 F (36.6 C)  TempSrc: Oral  SpO2: 98%  Weight: 229 lb (103.9 kg)  Height: 5\' 7"  (1.702 m)    This visit occurred during the SARS-CoV-2 public health emergency.  Safety protocols were in place, including screening questions prior to the visit, additional usage of staff PPE, and extensive cleaning of exam room while observing appropriate contact time as indicated for disinfecting solutions.   Assessment & Plan:

## 2019-10-04 NOTE — Patient Instructions (Signed)
We have sent in prednisone to take 2 pills daily for 5 days.  You can take tylenol for pain.

## 2019-10-05 DIAGNOSIS — M25531 Pain in right wrist: Secondary | ICD-10-CM | POA: Insufficient documentation

## 2019-10-05 DIAGNOSIS — M79641 Pain in right hand: Secondary | ICD-10-CM | POA: Insufficient documentation

## 2019-10-05 NOTE — Assessment & Plan Note (Signed)
Suspect tendonitis and rx prednisone short burst. Can take tylenol safely and continue using salon pas and ice.

## 2019-10-10 ENCOUNTER — Ambulatory Visit: Payer: Medicare Other | Attending: Internal Medicine

## 2019-10-10 DIAGNOSIS — Z23 Encounter for immunization: Secondary | ICD-10-CM

## 2019-10-10 NOTE — Progress Notes (Signed)
   Covid-19 Vaccination Clinic  Name:  Mary [name]    MRN: [id number] DOB: September 09, 1940  10/10/2019  [name] was observed post Covid-19 immunization for 15 minutes without incidence. She was provided with Vaccine Information Sheet and instruction to access the V-Safe system.   [name] was instructed to call 911 with any severe reactions post vaccine: Marland Kitchen Difficulty breathing  . Swelling of your face and throat  . A fast heartbeat  . A bad rash all over your body  . Dizziness and weakness    Immunizations Administered    Name Date Dose VIS Date Route   Pfizer COVID-19 Vaccine 10/10/2019 10:15 AM 0.3 mL 08/10/2019 Intramuscular   Manufacturer: [employer], [employer]   Lot: [id number]   [place]: [id number]

## 2019-11-01 NOTE — Progress Notes (Signed)
Cardiology Office Note:    Date:  11/02/2019   ID:  Mary [name], DOB 04-20-1941, MRN [id number]  PCP:  Hoyt Koch, MD  Cardiologist:  Sinclair Grooms, MD   Referring MD: Hoyt Koch, *   Chief Complaint  Patient presents with  . Coronary Artery Disease  . Congestive Heart Failure    History of Present Illness:    Mary [name] is a 79 y.o. 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  Poor dietary constraints related to fluid and salt intake.  Had a liverwurst sandwich and drank a lot of fluid on Wednesday and last evening (Thursday) noted lower extremity swelling and orthopnea.  Weight increased from 218pounds to 224 pounds on her home scales.  She took an extra Demadex last evening and states that the swelling is somewhat better but she slept in the recliner instead of in bed.  She has had no racing or pounding heart.  She denies chest discomfort.  Past Medical History:  Diagnosis Date  . Arthritis   . Atrial tachycardia ([place]) 04/23/2017  . CAD (coronary artery disease)    non obstructive  . 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 Procedure complicated by an atrial lead dislodgment and repeat procedure, 12/2013   . Carotid stenosis    Carotid US 5/16:  Bilateral ICA 1-39%; L vertebral retrograde; L BP 126/49, R BP 140/57  . Chronic combined systolic and diastolic CHF (congestive heart failure) ([place])   . Chronic lower back pain   . Colon cancer ([place])    a. s/p chemo  . Colon polyps   . Diabetic peripheral neuropathy ([place])   . DVT (deep venous thrombosis) ([place]) 2013   "twice behind knee on left side" (06/25/2013)  . Glaucoma   . Hyperlipidemia   . Hypertension   . Hypothyroidism   . Iron deficiency anemia   .  Multinodular goiter   . Osteoporosis   . PAF (paroxysmal atrial fibrillation) ([place])   . Sleep apnea    a. resolved post weight loss   . Type II diabetes mellitus ([place])     Past Surgical History:  Procedure Laterality Date  . CARDIAC CATHETERIZATION  2006   Never had PCI, 3 caths total. Last one in [place]  . CARDIAC CATHETERIZATION N/A 02/04/2015   Procedure: Left Heart Cath and Coronary Angiography;  Surgeon: Belva Crome, MD;  Location: [place] [facility];  Service: Cardiovascular;  Laterality: N/A;  . CATARACT EXTRACTION, BILATERAL Bilateral 2013   "and put stent in my left eye for glaucoma" (06/25/2013)  . CHOLECYSTECTOMY  1980's  . COLECTOMY  10/2010   Tumor removal  . EYE MUSCLE SURGERY Bilateral ~ 1963   "muscles too long; eyes would go out and up; tied muscles to hold my eyes straight" (06/25/2013)  . LEAD REVISION  01-03-2014   atrial lead revision by Dr Rayann Heman  . LEAD REVISION N/A 01/03/2014   Procedure: LEAD REVISION;  Surgeon: Coralyn Mark, MD;  Location: [facility];  Service: Cardiovascular;  Laterality: N/A;  . LEFT HEART CATH AND CORONARY ANGIOGRAPHY N/A 05/29/2018   Procedure: LEFT HEART CATH AND CORONARY ANGIOGRAPHY;  Surgeon: Belva Crome, MD;  Location: [facility];  Service: Cardiovascular;  Laterality: N/A;  .  PACEMAKER INSERTION  01/02/2014   STJ Assurity dual chamber pacemaker implnated by Dr Lovena Le for SSS  . PERMANENT PACEMAKER INSERTION N/A 01/02/2014   Procedure: PERMANENT PACEMAKER INSERTION;  Surgeon: Evans Lance, MD;  Location: [facility];  Service: Cardiovascular;  Laterality: N/A;  . PORTACATH PLACEMENT Left 11/2010  . ROUX-EN-Y GASTRIC BYPASS  2009  . ULNAR TUNNEL RELEASE Left ~ 2008  . UMBILICAL HERNIA REPAIR  1970's?    (06/25/2013)    Current Medications: Current Meds  Medication Sig  . albuterol (PROVENTIL HFA;VENTOLIN HFA) 108 (90 Base) MCG/ACT inhaler Inhale 2 puffs into the lungs every 6 (six) hours as needed for wheezing or  shortness of breath.  Marland Kitchen atorvastatin (LIPITOR) 80 MG tablet TAKE 1 TABLET(80 MG) BY MOUTH DAILY  . bimatoprost (LUMIGAN) 0.01 % SOLN Place 1 drop into the right eye at bedtime.   Marland Kitchen buPROPion (WELLBUTRIN XL) 150 MG 24 hr tablet TAKE 1 TABLET(150 MG) BY MOUTH DAILY  . ELIQUIS 5 MG TABS tablet TAKE 1 TABLET BY MOUTH TWICE A DAY  . gabapentin (NEURONTIN) 300 MG capsule Take 300 mg by mouth every evening.   Marland Kitchen HYDROcodone-acetaminophen (NORCO/VICODIN) 5-325 MG tablet Take 1 tablet by mouth every 4 (four) hours as needed for moderate pain.  . Insulin Glargine (BASAGLAR KWIKPEN) 100 UNIT/ML SOPN Inject 0.2 mLs (20 Units total) into the skin daily.  Marland Kitchen levothyroxine (SYNTHROID, LEVOTHROID) 125 MCG tablet Take 125 mcg by mouth daily before breakfast.  . metoprolol succinate (TOPROL-XL) 25 MG 24 hr tablet TAKE 1/2 TABLETS AT BEDTIME  . nitroGLYCERIN (NITROSTAT) 0.4 MG SL tablet Place 1 tablet (0.4 mg total) under the tongue every 5 (five) minutes as needed for chest pain.  . potassium chloride SA (K-DUR) 20 MEQ tablet Take 2 tablets (40 mEq total) by mouth 2 (two) times daily.  Marland Kitchen spironolactone (ALDACTONE) 25 MG tablet Take 0.5 tablets (12.5 mg total) by mouth daily.  Marland Kitchen torsemide (DEMADEX) 20 MG tablet Take 20 mg by mouth 3 (three) times daily. 2 in the Am; 1 in the PM     Allergies:   Other, Adhesive [tape], Aspirin, Latex, and Nsaids   Social History   Socioeconomic History  . Marital status: Married    Spouse name: [name]  . Number of children: 0  . Years of education: Not on file  . Highest education level: Not on file  Occupational History  . Occupation: Retired    Comment: office work  Tobacco Use  . Smoking status: Former Smoker    Packs/day: 1.00    Years: 30.00    Pack years: 30.00    Types: Cigarettes    Quit date: 02/2017    Years since quitting: 2.6  . Smokeless tobacco: Never Used  . Tobacco comment: started in her [age] until 2009   Substance and Sexual Activity  . Alcohol  use: Not Currently    Alcohol/week: 0.0 standard drinks  . Drug use: No  . Sexual activity: Yes  Other Topics Concern  . Not on file  Social History Narrative   Married to husband, [name]   No children   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   Recently moved back here from [place] Strain:   . Difficulty of Paying Living Expenses: Not on file  Food Insecurity:   . Worried About Charity fundraiser in the Last Year: Not on file  .  Ran Out of Food in the Last Year: Not on file  Transportation Needs:   . Lack of Transportation (Medical): Not on file  . Lack of Transportation (Non-Medical): Not on file  Physical Activity:   . Days of Exercise per Week: Not on file  . Minutes of Exercise per Session: Not on file  Stress: No Stress Concern Present  . Feeling of Stress : Not at all  Social Connections:   . Frequency of Communication with Friends and Family: Not on file  . Frequency of Social Gatherings with Friends and Family: Not on file  . Attends Religious Services: Not on file  . Active Member of Clubs or Organizations: Not on file  . Attends Archivist Meetings: Not on file  . Marital Status: Not on file     Family History: The patient's family history includes Bladder Cancer in her brother and cousin; Breast cancer in her maternal aunt, mother, and sister; Clotting disorder in her sister; Colon polyps in her brother and brother; Diabetes in her maternal grandmother, mother, and sister; Heart attack in her brother, father, and sister; Heart disease in her father; Pancreatic cancer in her brother; Prostate cancer in her brother.  ROS:   Please see the history of present illness.    She eats tomato soup which I encouraged against, saltine crackers, has not needed to use nitroglycerin, denies racing heart, is compliant with her medications, has not needed to use nitroglycerin.  All other systems  reviewed and are negative.  EKGs/Labs/Other Studies Reviewed:    The following studies were reviewed today:  2D Doppler echocardiogram 2019: Study Conclusions   - Left ventricle: Systolic function was normal. The estimated  ejection fraction was in the range of 55% to 60%. There is  hypokinesis of the apical septal myocardium.   Impressions:   - Limited study with definity to evaluate wall motion; akinesis of  the distal septum with overall preserved LV function.    EKG:  EKG performed on February 15, 2019 demonstrates AV sequential pacing.  Recent Labs: 02/08/2019: ALT 10; Pro B Natriuretic peptide (BNP) 166.0 02/14/2019: B Natriuretic Peptide 84.2; Hemoglobin 11.0; Platelets 358 04/02/2019: BUN 20; Creatinine, Ser 1.15; Potassium 4.8; Sodium 135  Recent Lipid Panel    Component Value Date/Time   CHOL 124 05/25/2018 0531   TRIG 81 05/25/2018 0531   HDL 35 (L) 05/25/2018 0531   CHOLHDL 3.5 05/25/2018 0531   VLDL 16 05/25/2018 0531   LDLCALC 73 05/25/2018 0531   LDLDIRECT 79.0 03/23/2016 1529    Physical Exam:    VS:  BP 98/68   Pulse 74   Ht 5\' 7"  (1.702 m)   Wt 220 lb 12.8 oz (100.2 kg)   SpO2 97%   BMI 34.58 kg/m     Wt Readings from Last 3 Encounters:  11/02/19 220 lb 12.8 oz (100.2 kg)  10/04/19 229 lb (103.9 kg)  05/23/19 227 lb (103 kg)     GEN: Moderate obesity.. No acute distress HEENT: Normal NECK: Mildly elevated above the clavicle with the patient sitting LYMPHATICS: No lymphadenopathy CARDIAC: S4 gallop is audible RRR without murmur, S3 gallop.  There is 2+ bilateral pedal and lower extremity edema.Marland Kitchen VASCULAR: normal Pulses. No bruits. RESPIRATORY: Faint expiratory wheezes both lungs. ABDOMEN: Soft, non-tender, non-distended, No pulsatile mass, MUSCULOSKELETAL: No deformity  SKIN: Warm and dry NEUROLOGIC:  Alert and oriented x 3 PSYCHIATRIC:  Normal affect   ASSESSMENT:    1. PAF (paroxysmal atrial fibrillation) ([place])  2. Chronic  diastolic heart failure ([place])   3. Essential hypertension   4. Coronary artery disease of native artery of native heart with stable angina pectoris ([place])   5. Morbid obesity ([place])   6. Chronic obstructive pulmonary disease, unspecified COPD type ([place])   7. Chronic anticoagulation   8. Tobacco abuse   9. Educated about COVID-19 virus infection    PLAN:    In order of problems listed above:  1. No clinical evidence of atrial fibrillation based upon today's exam or the last EKG or pacemaker interrogation. 2. Clinically volume overloaded with lower extremity edema.  Home weight calendar reviewed and within the past 24 hours and increase in weight from 218 to 224 pounds as noted.  This results from dietary indiscretion of both salt and water.  We discussed 2 g sodium and 1500 cc fluid restriction.  Okay to take 40 mg of torsemide twice daily until weight is back near 218 pounds.  She should call if weight does not respond. 3. Blood pressure is borderline.  Target should be between 110 and [id number] mmHg systolic. 4. Secondary prevention discussed. 5. She is losing weight, she says because of decrease caloric intake. 6. Has wheezing on exam today.  Still smokes. 7. .  Most recent kidney function is stable to support her current dose. 8. Again encouraged to discontinue smoking.  No commitment from the patient. 9. COVID-19 vaccine is endorsed.  Social distancing and mask wearing is also being practiced.  Overall education and awareness concerning primary/secondary risk prevention was discussed in detail: LDL less than 70, hemoglobin A1c less than 7, blood pressure target less than 130/80 mmHg, >150 minutes of moderate aerobic activity per week, avoidance of smoking, weight control (via diet and exercise), and continued surveillance/management of/for obstructive sleep apnea.    Medication Adjustments/Labs and Tests Ordered: Current medicines are reviewed at length with the patient today.  Concerns  regarding medicines are outlined above.  No orders of the defined types were placed in this encounter.  No orders of the defined types were placed in this encounter.   Patient Instructions  Medication Instructions:  Your physician recommends that you continue on your current medications as directed. Please refer to the Current Medication list given to you today.   *If you need a refill on your cardiac medications before your next appointment, please call your pharmacy*   Lab Work: None If you have labs (blood work) drawn today and your tests are completely normal, you will receive your results only by: Marland Kitchen MyChart Message (if you have MyChart) OR . A paper copy in the mail If you have any lab test that is abnormal or we need to change your treatment, we will call you to review the results.   Testing/Procedures: None   Follow-Up: At [facility], you and your health needs are our priority.  As part of our continuing mission to provide you with exceptional heart care, we have created designated Provider Care Teams.  These Care Teams include your primary Cardiologist (physician) and Advanced Practice Providers (APPs -  Physician Assistants and Nurse Practitioners) who all work together to provide you with the care you need, when you need it.  We recommend signing up for the patient portal called "MyChart".  Sign up information is provided on this After Visit Summary.  MyChart is used to connect with patients for Virtual Visits (Telemedicine).  Patients are able to view lab/test results, encounter notes, upcoming appointments, etc.  Non-urgent messages can  be sent to your provider as well.   To learn more about what you can do with MyChart, go to [email or web address].    Your next appointment:   6 month(s)  The format for your next appointment:   In Person  Provider:   You may see Sinclair Grooms, MD or one of the following Advanced Practice Providers on your designated Care  Team:    Truitt Merle, NP  Cecilie Kicks, NP  Kathyrn Drown, NP    Other Instructions      Signed, Sinclair Grooms, MD  11/02/2019 5:13 PM    [place]

## 2019-11-02 ENCOUNTER — Encounter: Payer: Self-pay | Admitting: Interventional Cardiology

## 2019-11-02 ENCOUNTER — Ambulatory Visit (INDEPENDENT_AMBULATORY_CARE_PROVIDER_SITE_OTHER): Payer: Medicare Other | Admitting: Interventional Cardiology

## 2019-11-02 ENCOUNTER — Other Ambulatory Visit: Payer: Self-pay

## 2019-11-02 VITALS — BP 98/68 | HR 74 | Ht 67.0 in | Wt 220.8 lb

## 2019-11-02 DIAGNOSIS — I480 Paroxysmal atrial fibrillation: Secondary | ICD-10-CM

## 2019-11-02 DIAGNOSIS — J449 Chronic obstructive pulmonary disease, unspecified: Secondary | ICD-10-CM

## 2019-11-02 DIAGNOSIS — I10 Essential (primary) hypertension: Secondary | ICD-10-CM

## 2019-11-02 DIAGNOSIS — I5032 Chronic diastolic (congestive) heart failure: Secondary | ICD-10-CM

## 2019-11-02 DIAGNOSIS — I25118 Atherosclerotic heart disease of native coronary artery with other forms of angina pectoris: Secondary | ICD-10-CM

## 2019-11-02 DIAGNOSIS — Z7189 Other specified counseling: Secondary | ICD-10-CM

## 2019-11-02 DIAGNOSIS — Z720 Tobacco use: Secondary | ICD-10-CM

## 2019-11-02 DIAGNOSIS — Z7901 Long term (current) use of anticoagulants: Secondary | ICD-10-CM | POA: Diagnosis not present

## 2019-11-02 NOTE — Patient Instructions (Signed)

## 2019-11-17 DIAGNOSIS — W19XXXA Unspecified fall, initial encounter: Secondary | ICD-10-CM | POA: Diagnosis not present

## 2019-11-17 DIAGNOSIS — Y998 Other external cause status: Secondary | ICD-10-CM | POA: Diagnosis not present

## 2019-11-17 DIAGNOSIS — S0003XA Contusion of scalp, initial encounter: Secondary | ICD-10-CM | POA: Diagnosis not present

## 2019-11-17 DIAGNOSIS — D320 Benign neoplasm of cerebral meninges: Secondary | ICD-10-CM | POA: Diagnosis not present

## 2019-11-17 DIAGNOSIS — Z8679 Personal history of other diseases of the circulatory system: Secondary | ICD-10-CM | POA: Diagnosis not present

## 2019-11-17 DIAGNOSIS — Z8639 Personal history of other endocrine, nutritional and metabolic disease: Secondary | ICD-10-CM | POA: Diagnosis not present

## 2019-11-17 DIAGNOSIS — I6782 Cerebral ischemia: Secondary | ICD-10-CM | POA: Diagnosis not present

## 2019-11-17 DIAGNOSIS — Z87891 Personal history of nicotine dependence: Secondary | ICD-10-CM | POA: Diagnosis not present

## 2019-11-17 DIAGNOSIS — S098XXA Other specified injuries of head, initial encounter: Secondary | ICD-10-CM | POA: Diagnosis not present

## 2019-11-17 DIAGNOSIS — W0110XA Fall on same level from slipping, tripping and stumbling with subsequent striking against unspecified object, initial encounter: Secondary | ICD-10-CM | POA: Diagnosis not present

## 2019-11-20 ENCOUNTER — Other Ambulatory Visit: Payer: Self-pay

## 2019-11-20 ENCOUNTER — Ambulatory Visit (INDEPENDENT_AMBULATORY_CARE_PROVIDER_SITE_OTHER): Payer: Medicare Other | Admitting: Family

## 2019-11-20 VITALS — BP 110/68 | HR 74 | Temp 98.2°F | Ht 67.0 in | Wt 223.0 lb

## 2019-11-20 DIAGNOSIS — W19XXXD Unspecified fall, subsequent encounter: Secondary | ICD-10-CM

## 2019-11-20 DIAGNOSIS — M25531 Pain in right wrist: Secondary | ICD-10-CM | POA: Diagnosis not present

## 2019-11-20 DIAGNOSIS — S0990XD Unspecified injury of head, subsequent encounter: Secondary | ICD-10-CM

## 2019-11-20 MED ORDER — HYDROCODONE-ACETAMINOPHEN 5-325 MG PO TABS: 1.0000 | ORAL_TABLET | ORAL | 0 refills | Status: DC | PRN

## 2019-11-20 NOTE — Progress Notes (Signed)
Mary [name] is a 79 y.o. female with the following history as recorded in EpicCare:  Patient Active Problem List   Diagnosis Date Noted  . Right wrist pain 10/05/2019  . OSA (obstructive sleep apnea) 06/07/2018  . COPD (chronic obstructive pulmonary disease) ([place]) 05/24/2018  . Acute on chronic combined systolic and diastolic CHF (congestive heart failure) ([place]) 05/24/2018  . Acute respiratory failure with hypoxia ([place]) 05/21/2018  . Osteoarthritis 05/25/2017  . Atrial tachycardia ([place]) 04/23/2017  . Second degree AV block, Mobitz type II   . Depression 08/02/2016  . Chronic diastolic heart failure ([place]) 08/02/2016  . Tobacco abuse 08/02/2016  . Right lumbar radiculitis 06/09/2016  . Tear of medial meniscus of right knee, current 04/22/2016  . Chronic venous insufficiency 02/21/2015  . 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 Atrioventricular block, complete ([place]) 01/02/2014  . Chronic anticoagulation 10/09/2013  . PAF (paroxysmal atrial fibrillation) ([place]) 08/29/2013  . Obesity 05/05/2013  . CAD in native artery   . Diabetes mellitus with complication ([place])   . Hypothyroidism   . Hypertension   . Colon cancer ([place])   . Hyperlipidemia     Current Outpatient Medications  Medication Sig Dispense Refill  . albuterol (PROVENTIL HFA;VENTOLIN HFA) 108 (90 Base) MCG/ACT inhaler Inhale 2 puffs into the lungs every 6 (six) hours as needed for wheezing or shortness of breath. 1 Inhaler 2  . atorvastatin (LIPITOR) 80 MG tablet TAKE 1 TABLET(80 MG) BY MOUTH DAILY 90 tablet 0  . bimatoprost (LUMIGAN) 0.01 % SOLN Place 1 drop into the right eye at bedtime.     Marland Kitchen buPROPion (WELLBUTRIN XL) 150 MG 24 hr tablet TAKE 1 TABLET(150 MG) BY MOUTH DAILY 90 tablet 1  . ELIQUIS 5 MG TABS tablet TAKE 1 TABLET BY MOUTH TWICE A DAY 180 tablet 1  . gabapentin (NEURONTIN) 300 MG capsule Take 300 mg by mouth every evening.     Marland Kitchen HYDROcodone-acetaminophen  (NORCO/VICODIN) 5-325 MG tablet Take 1 tablet by mouth every 4 (four) hours as needed for moderate pain. 30 tablet 0  . Insulin Glargine (BASAGLAR KWIKPEN) 100 UNIT/ML SOPN Inject 0.2 mLs (20 Units total) into the skin daily. 10 pen 0  . levothyroxine (SYNTHROID, LEVOTHROID) 125 MCG tablet Take 125 mcg by mouth daily before breakfast.  5  . metoprolol succinate (TOPROL-XL) 25 MG 24 hr tablet TAKE 1/2 TABLETS AT BEDTIME 90 tablet 3  . nitroGLYCERIN (NITROSTAT) 0.4 MG SL tablet Place 1 tablet (0.4 mg total) under the tongue every 5 (five) minutes as needed for chest pain. 25 tablet 2  . potassium chloride SA (K-DUR) 20 MEQ tablet Take 2 tablets (40 mEq total) by mouth 2 (two) times daily. 120 tablet 5  . torsemide (DEMADEX) 20 MG tablet Take 20 mg by mouth 3 (three) times daily. 2 in the Am; 1 in the PM    . spironolactone (ALDACTONE) 25 MG tablet Take 0.5 tablets (12.5 mg total) by mouth daily. 45 tablet 3   No current facility-administered medications for this visit.    Allergies: Other, Adhesive [tape], Aspirin, Latex, and Nsaids  Past Medical History:  Diagnosis Date  . Arthritis   . Atrial tachycardia ([place]) 04/23/2017  . CAD (coronary artery disease)    non obstructive  . 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 Procedure complicated by an atrial lead dislodgment and repeat procedure, 12/2013   .  Carotid stenosis    Carotid US 5/16:  Bilateral ICA 1-39%; L vertebral retrograde; L BP 126/49, R BP 140/57  . Chronic combined systolic and diastolic CHF (congestive heart failure) ([place])   . Chronic lower back pain   . Colon cancer ([place])    a. s/p chemo  . Colon polyps   . Diabetic peripheral neuropathy ([place])   . DVT (deep venous thrombosis) ([place]) 2013   "twice behind knee on left side" (06/25/2013)  . Glaucoma   . Hyperlipidemia   . Hypertension   . Hypothyroidism   . Iron deficiency anemia   . Multinodular goiter   .  Osteoporosis   . PAF (paroxysmal atrial fibrillation) ([place])   . Sleep apnea    a. resolved post weight loss   . Type II diabetes mellitus ([place])     Past Surgical History:  Procedure Laterality Date  . CARDIAC CATHETERIZATION  2006   Never had PCI, 3 caths total. Last one in [place]  . CARDIAC CATHETERIZATION N/A 02/04/2015   Procedure: Left Heart Cath and Coronary Angiography;  Surgeon: Belva Crome, MD;  Location: [place] LAB;  Service: Cardiovascular;  Laterality: N/A;  . CATARACT EXTRACTION, BILATERAL Bilateral 2013   "and put stent in my left eye for glaucoma" (06/25/2013)  . CHOLECYSTECTOMY  1980's  . COLECTOMY  10/2010   Tumor removal  . EYE MUSCLE SURGERY Bilateral ~ 1963   "muscles too long; eyes would go out and up; tied muscles to hold my eyes straight" (06/25/2013)  . LEAD REVISION  01-03-2014   atrial lead revision by Dr Rayann Heman  . LEAD REVISION N/A 01/03/2014   Procedure: LEAD REVISION;  Surgeon: Coralyn Mark, MD;  Location: [facility] CATH LAB;  Service: Cardiovascular;  Laterality: N/A;  . LEFT HEART CATH AND CORONARY ANGIOGRAPHY N/A 05/29/2018   Procedure: LEFT HEART CATH AND CORONARY ANGIOGRAPHY;  Surgeon: Belva Crome, MD;  Location: [place] LAB;  Service: Cardiovascular;  Laterality: N/A;  . PACEMAKER INSERTION  01/02/2014   STJ Assurity dual chamber pacemaker implnated by Dr Lovena Le for SSS  . PERMANENT PACEMAKER INSERTION N/A 01/02/2014   Procedure: PERMANENT PACEMAKER INSERTION;  Surgeon: Evans Lance, MD;  Location: [facility] CATH LAB;  Service: Cardiovascular;  Laterality: N/A;  . PORTACATH PLACEMENT Left 11/2010  . ROUX-EN-Y GASTRIC BYPASS  2009  . ULNAR TUNNEL RELEASE Left ~ 2008  . UMBILICAL HERNIA REPAIR  1970's?    (06/25/2013)    Family History  Problem Relation Age of Onset  . Breast cancer Mother   . Diabetes Mother   . Heart disease Father   . Heart attack Father   . Heart attack Sister   . Heart attack Brother   . Bladder Cancer Brother   .  Prostate cancer Brother   . Colon polyps Brother   . Breast cancer Sister   . Breast cancer Maternal Aunt        x 2 aunts  . Pancreatic cancer Brother   . Colon polyps Brother   . Bladder Cancer Cousin   . Clotting disorder Sister   . Diabetes Sister        x 3  . Diabetes Maternal Grandmother     Social History   Tobacco Use  . Smoking status: Former Smoker    Packs/day: 1.00    Years: 30.00    Pack years: 30.00    Types: Cigarettes    Quit date: 02/2017    Years since  quitting: 2.7  . Smokeless tobacco: Never Used  . Tobacco comment: started in her [age] until 2009   Substance Use Topics  . Alcohol use: Not Currently    Alcohol/week: 0.0 standard drinks    Subjective:  Accompanied by her husband today; tripped over her dog and hit her head on back of table on Saturday evening; went to ER to be sure that no bleeding due to use of Eliquis; CT imaging was all normal and no fracture or bleeding noted; per patient, she has been feeling fine; did have mild headache yesterday and episode of blurred vision but symptoms have since resolved;   Objective:  Vitals:   11/20/19 0958  BP: 110/68  Pulse: 74  Temp: 98.2 F (36.8 C)  TempSrc: Oral  SpO2: 97%  Weight: 223 lb (101.2 kg)  Height: 5\' 7"  (1.702 m)    General: Well developed, well nourished, in no acute distress  Skin : Warm and dry.  Head: Normocephalic and atraumatic  Eyes: Sclera and conjunctiva clear; pupils round and reactive to light; extraocular movements intact  Ears: External normal; canals clear; tympanic membranes normal  Oropharynx: Pink, supple. No suspicious lesions  Neck: Supple without thyromegaly, adenopathy  Lungs: Respirations unlabored; clear to auscultation bilaterally without wheeze, rales, rhonchi  CVS exam: normal rate and regular rhythm.  Musculoskeletal: No deformities; no active joint inflammation  Extremities: No edema, cyanosis, clubbing  Vessels: Symmetric bilaterally  Neurologic:  Alert and oriented; speech intact; face symmetrical; in wheelchair  Assessment:  1. Traumatic injury of head, subsequent encounter   2. Fall, subsequent encounter   3. Right wrist pain     Plan:  Reviewed notes and images from recent ER visit; Physical exam is reassuring in office; patient notes she is feeling very good; Do not feel MRI is needed at that time; Refill updated on Norco as requested;  She is encouraged to follow-up with her orthopedist about persisting right wrist pain; she agrees;   This visit occurred during the SARS-CoV-2 public health emergency.  Safety protocols were in place, including screening questions prior to the visit, additional usage of staff PPE, and extensive cleaning of exam room while observing appropriate contact time as indicated for disinfecting solutions.     No follow-ups on file.  No orders of the defined types were placed in this encounter.   Requested Prescriptions   Signed Prescriptions Disp Refills  . HYDROcodone-acetaminophen (NORCO/VICODIN) 5-325 MG tablet 30 tablet 0    Sig: Take 1 tablet by mouth every 4 (four) hours as needed for moderate pain.

## 2019-12-17 ENCOUNTER — Ambulatory Visit (INDEPENDENT_AMBULATORY_CARE_PROVIDER_SITE_OTHER): Payer: Medicare Other | Admitting: *Deleted

## 2019-12-17 DIAGNOSIS — I442 Atrioventricular block, complete: Secondary | ICD-10-CM

## 2019-12-17 LAB — CUP PACEART REMOTE DEVICE CHECK
Battery Remaining Longevity: 72 mo
Battery Remaining Percentage: 80 %
Battery Voltage: 2.93 V
Brady Statistic AP VP Percent: 86 %
Brady Statistic AP VS Percent: 1 %
Brady Statistic AS VP Percent: 14 %
Brady Statistic AS VS Percent: 1 %
Brady Statistic RA Percent Paced: 86 %
Brady Statistic RV Percent Paced: 99 %
Date Time Interrogation Session: 20210419060917
Implantable Lead Implant Date: 20150506
Implantable Lead Implant Date: 20150506
Implantable Lead Location: 753859
Implantable Lead Location: 753860
Implantable Pulse Generator Implant Date: 20150506
Lead Channel Impedance Value: 380 Ohm
Lead Channel Impedance Value: 430 Ohm
Lead Channel Pacing Threshold Amplitude: 0.75 V
Lead Channel Pacing Threshold Amplitude: 0.75 V
Lead Channel Pacing Threshold Pulse Width: 0.4 ms
Lead Channel Pacing Threshold Pulse Width: 0.4 ms
Lead Channel Sensing Intrinsic Amplitude: 0.7 mV
Lead Channel Sensing Intrinsic Amplitude: 11.3 mV
Lead Channel Setting Pacing Amplitude: 2.5 V
Lead Channel Setting Pacing Amplitude: 2.5 V
Lead Channel Setting Pacing Pulse Width: 0.4 ms
Lead Channel Setting Sensing Sensitivity: 4 mV
Pulse Gen Model: 2240
Pulse Gen Serial Number: 3013730

## 2019-12-18 NOTE — Progress Notes (Signed)
PPM Remote  

## 2019-12-24 ENCOUNTER — Encounter: Payer: Self-pay | Admitting: *Deleted

## 2020-01-01 ENCOUNTER — Other Ambulatory Visit: Payer: Self-pay | Admitting: Cardiology

## 2020-01-04 ENCOUNTER — Other Ambulatory Visit: Payer: Self-pay | Admitting: Interventional Cardiology

## 2020-01-04 MED ORDER — SPIRONOLACTONE 25 MG PO TABS: 12.5000 mg | ORAL_TABLET | Freq: Every day | ORAL | 3 refills | Status: DC

## 2020-01-04 NOTE — Telephone Encounter (Signed)
Pharmacist with [facility] states he is requesting to confirm RX for spironolactone (ALDACTONE) 25 MG tablet medication. Please return call to [place] at [phone].

## 2020-01-04 NOTE — Telephone Encounter (Signed)
Pt's medication was sent to pt's pharmacy as requested. Confirmation received.  °

## 2020-01-05 ENCOUNTER — Encounter: Payer: Self-pay | Admitting: Internal Medicine

## 2020-01-07 ENCOUNTER — Other Ambulatory Visit: Payer: Self-pay | Admitting: *Deleted

## 2020-01-07 MED ORDER — SPIRONOLACTONE 25 MG PO TABS: 12.5000 mg | ORAL_TABLET | Freq: Every day | ORAL | 3 refills | Status: DC

## 2020-01-10 IMAGING — CR DG CHEST 2V
2 series · 2 of 2 positions shown · non-contrast
Comparison: 04/25/2017 chest radiograph.

CLINICAL DATA: Dyspnea

EXAM:
CHEST - 2 VIEW

[w chest lat]
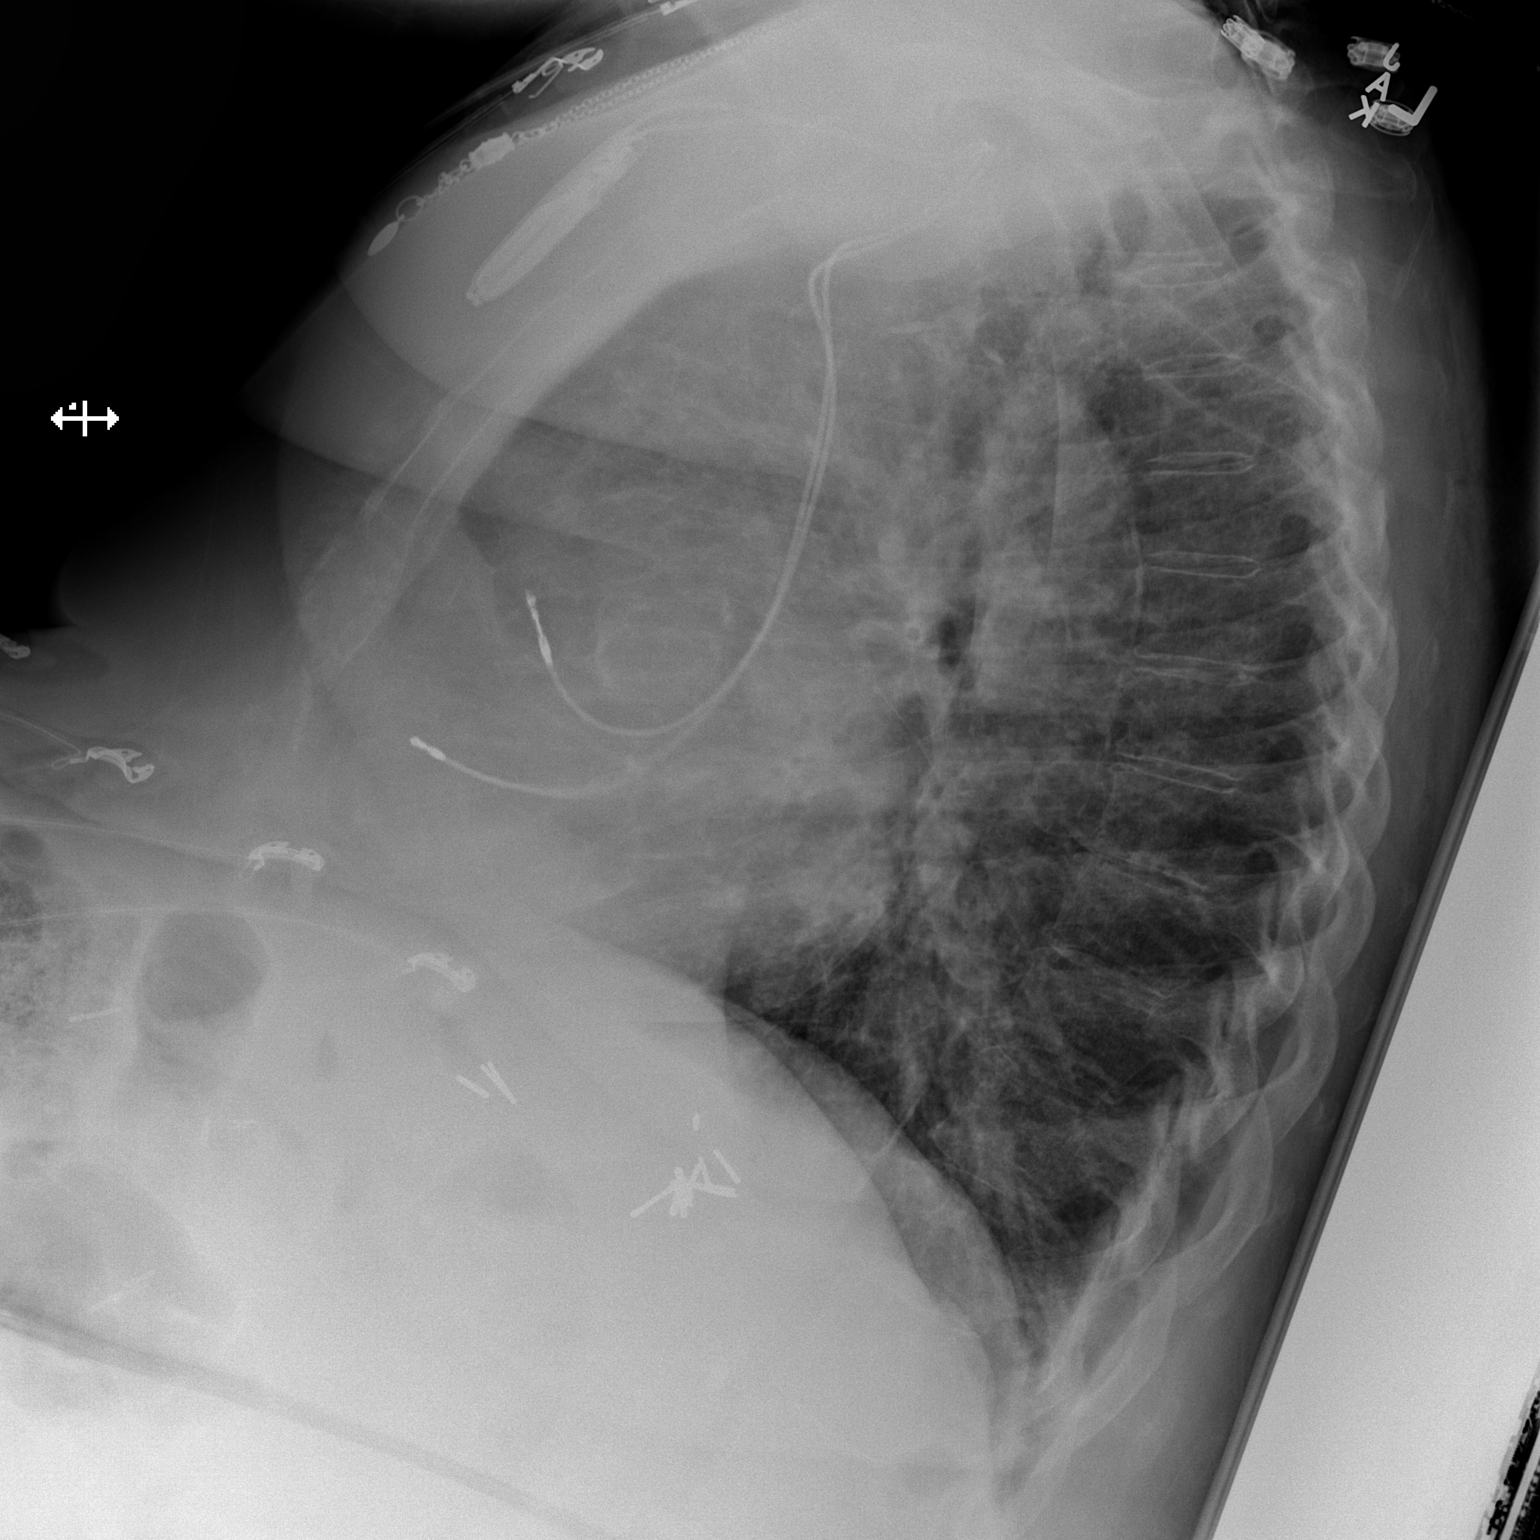

[x chest ap]
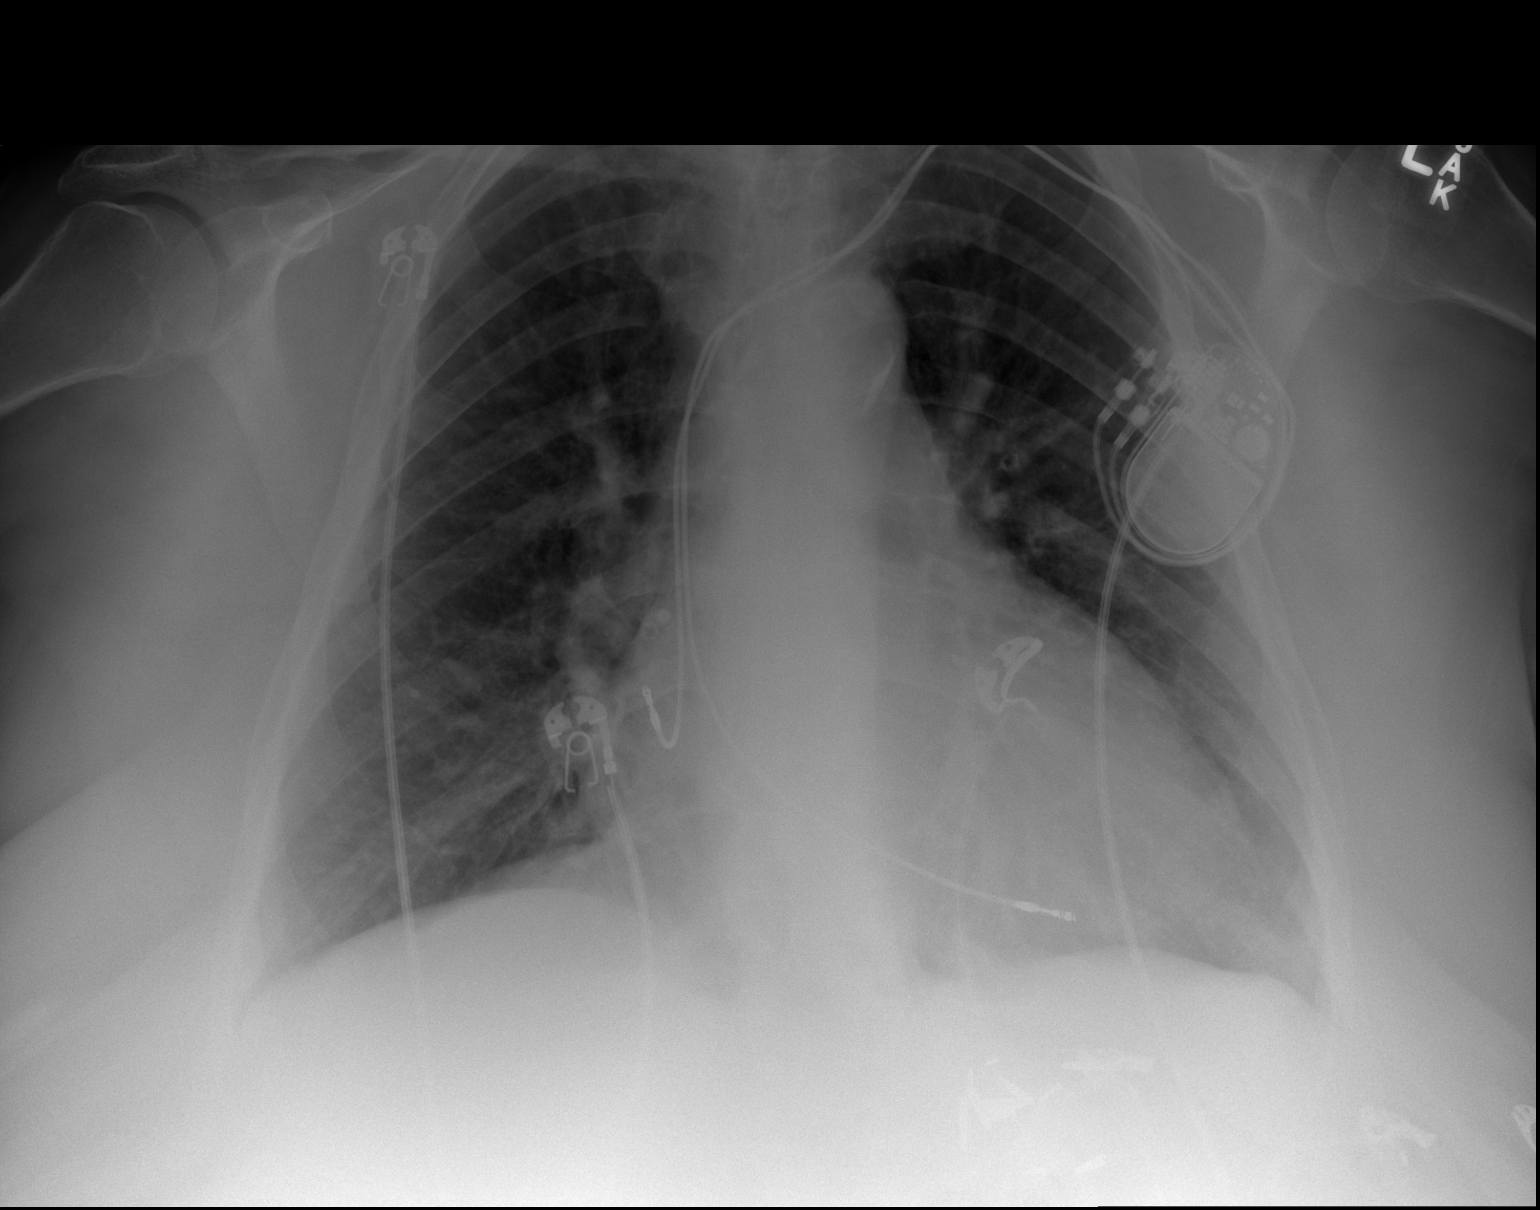

[2 of 2 positions shown; findings below may reference images not displayed]

FINDINGS: Stable configuration of 2 lead left subclavian pacemaker. Stable
cardiomediastinal silhouette with mild cardiomegaly. No
pneumothorax. No pleural effusion. Cephalization of the pulmonary
vasculature without overt pulmonary edema. No acute consolidative
airspace disease.
IMPRESSION: Mild cardiomegaly without overt pulmonary edema.

## 2020-01-12 IMAGING — CR DG CHEST 2V
2 series · 2 of 2 positions shown · non-contrast
Comparison: 05/21/2018

CLINICAL DATA: Nausea, shortness of Breath

EXAM:
CHEST - 2 VIEW

[chest lat]
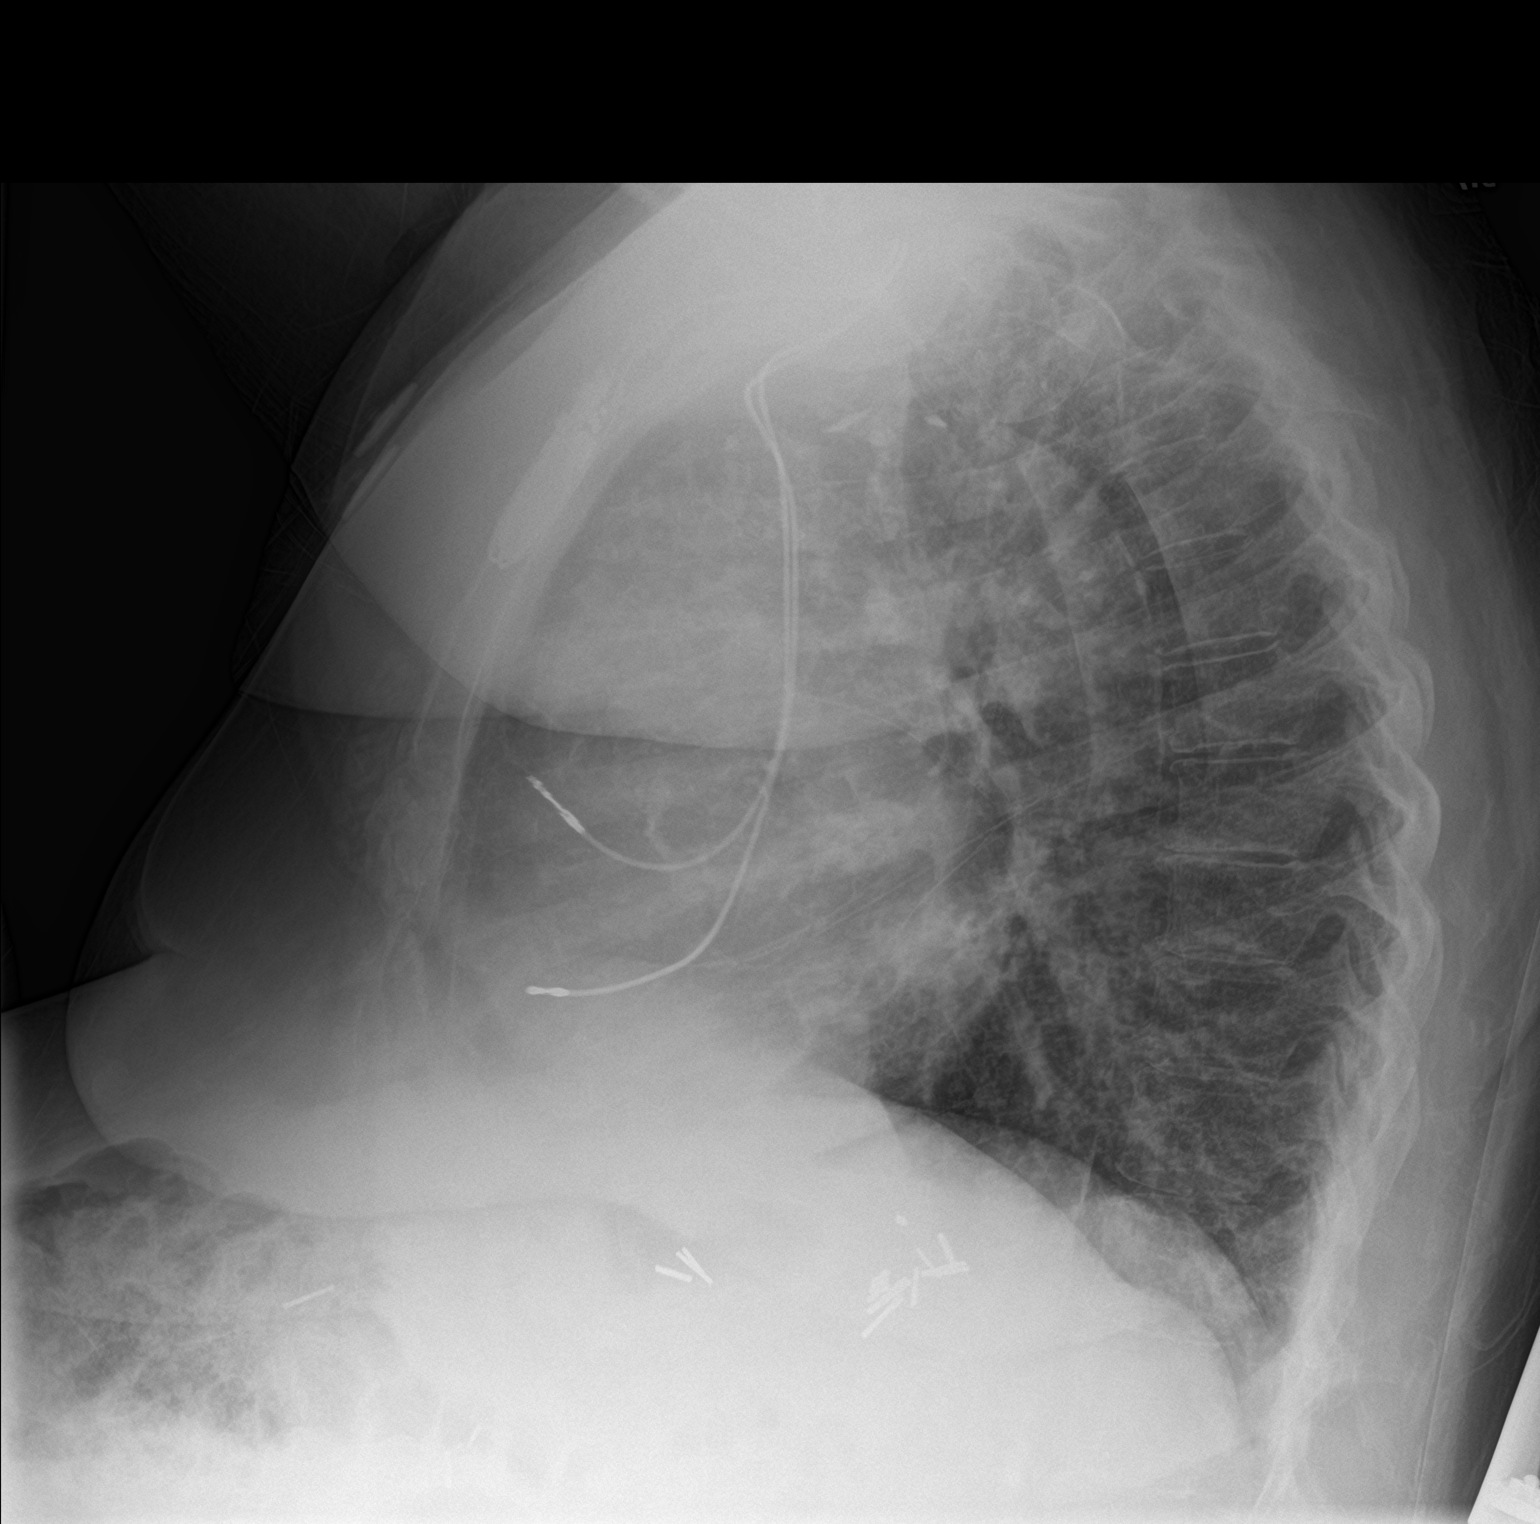

[chest ap]
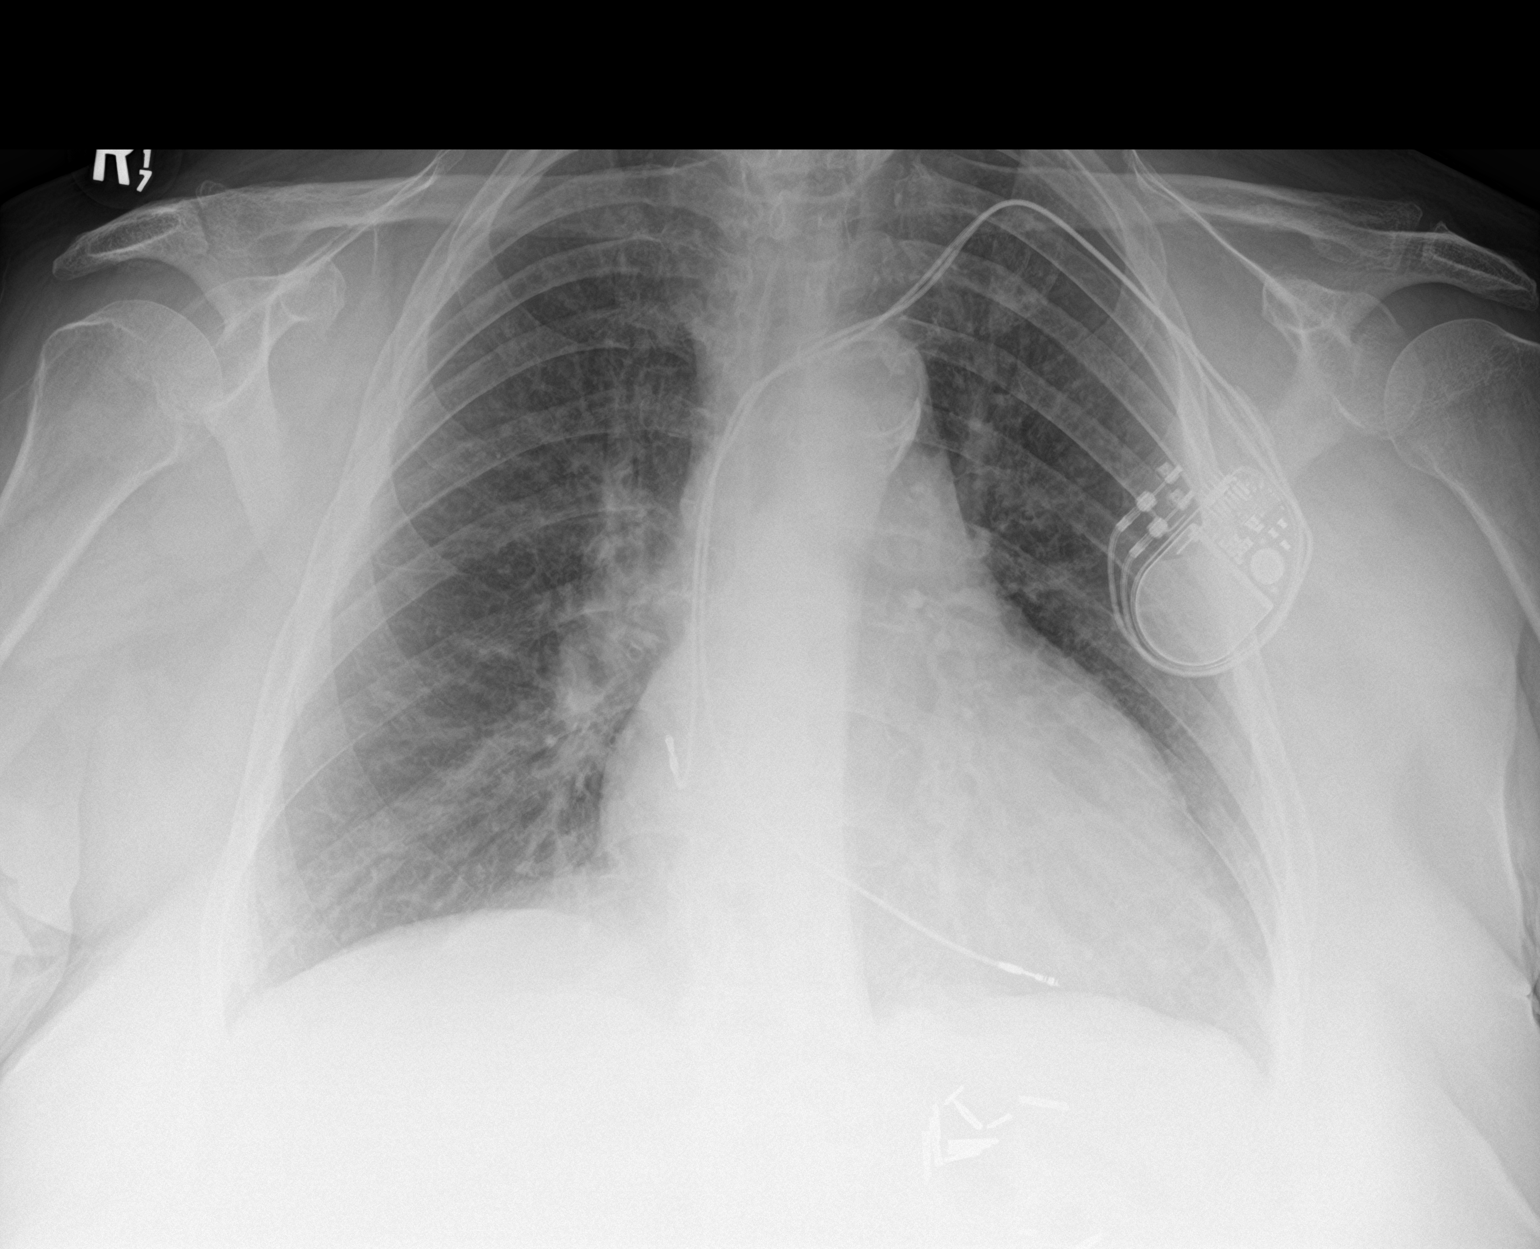

[2 of 2 positions shown; findings below may reference images not displayed]

FINDINGS: Cardiomegaly, vascular congestion and peribronchial thickening. Left
pacer remains in place, unchanged. No confluent opacities or
effusions. No acute bony abnormality.
IMPRESSION: Cardiomegaly, vascular congestion.

Mild bronchitic changes.

## 2020-01-16 DIAGNOSIS — E1142 Type 2 diabetes mellitus with diabetic polyneuropathy: Secondary | ICD-10-CM | POA: Diagnosis not present

## 2020-01-16 DIAGNOSIS — E039 Hypothyroidism, unspecified: Secondary | ICD-10-CM | POA: Diagnosis not present

## 2020-01-22 DIAGNOSIS — I509 Heart failure, unspecified: Secondary | ICD-10-CM | POA: Diagnosis not present

## 2020-01-22 DIAGNOSIS — E1142 Type 2 diabetes mellitus with diabetic polyneuropathy: Secondary | ICD-10-CM | POA: Diagnosis not present

## 2020-01-22 DIAGNOSIS — Z794 Long term (current) use of insulin: Secondary | ICD-10-CM | POA: Diagnosis not present

## 2020-01-22 DIAGNOSIS — E039 Hypothyroidism, unspecified: Secondary | ICD-10-CM | POA: Diagnosis not present

## 2020-01-22 DIAGNOSIS — E049 Nontoxic goiter, unspecified: Secondary | ICD-10-CM | POA: Diagnosis not present

## 2020-01-23 DIAGNOSIS — H02834 Dermatochalasis of left upper eyelid: Secondary | ICD-10-CM | POA: Diagnosis not present

## 2020-01-23 DIAGNOSIS — Z961 Presence of intraocular lens: Secondary | ICD-10-CM | POA: Diagnosis not present

## 2020-01-23 DIAGNOSIS — H401131 Primary open-angle glaucoma, bilateral, mild stage: Secondary | ICD-10-CM | POA: Diagnosis not present

## 2020-01-23 DIAGNOSIS — H02831 Dermatochalasis of right upper eyelid: Secondary | ICD-10-CM | POA: Diagnosis not present

## 2020-01-23 DIAGNOSIS — E119 Type 2 diabetes mellitus without complications: Secondary | ICD-10-CM | POA: Diagnosis not present

## 2020-01-23 DIAGNOSIS — Z794 Long term (current) use of insulin: Secondary | ICD-10-CM | POA: Diagnosis not present

## 2020-02-09 IMAGING — CR DG CHEST 2V
2 series · 2 of 2 positions shown · non-contrast
Comparison: 05/23/2018, 05/21/2018 and 04/25/2017

CLINICAL DATA: Chest congestion.

EXAM:
CHEST - 2 VIEW

[w chest pa]
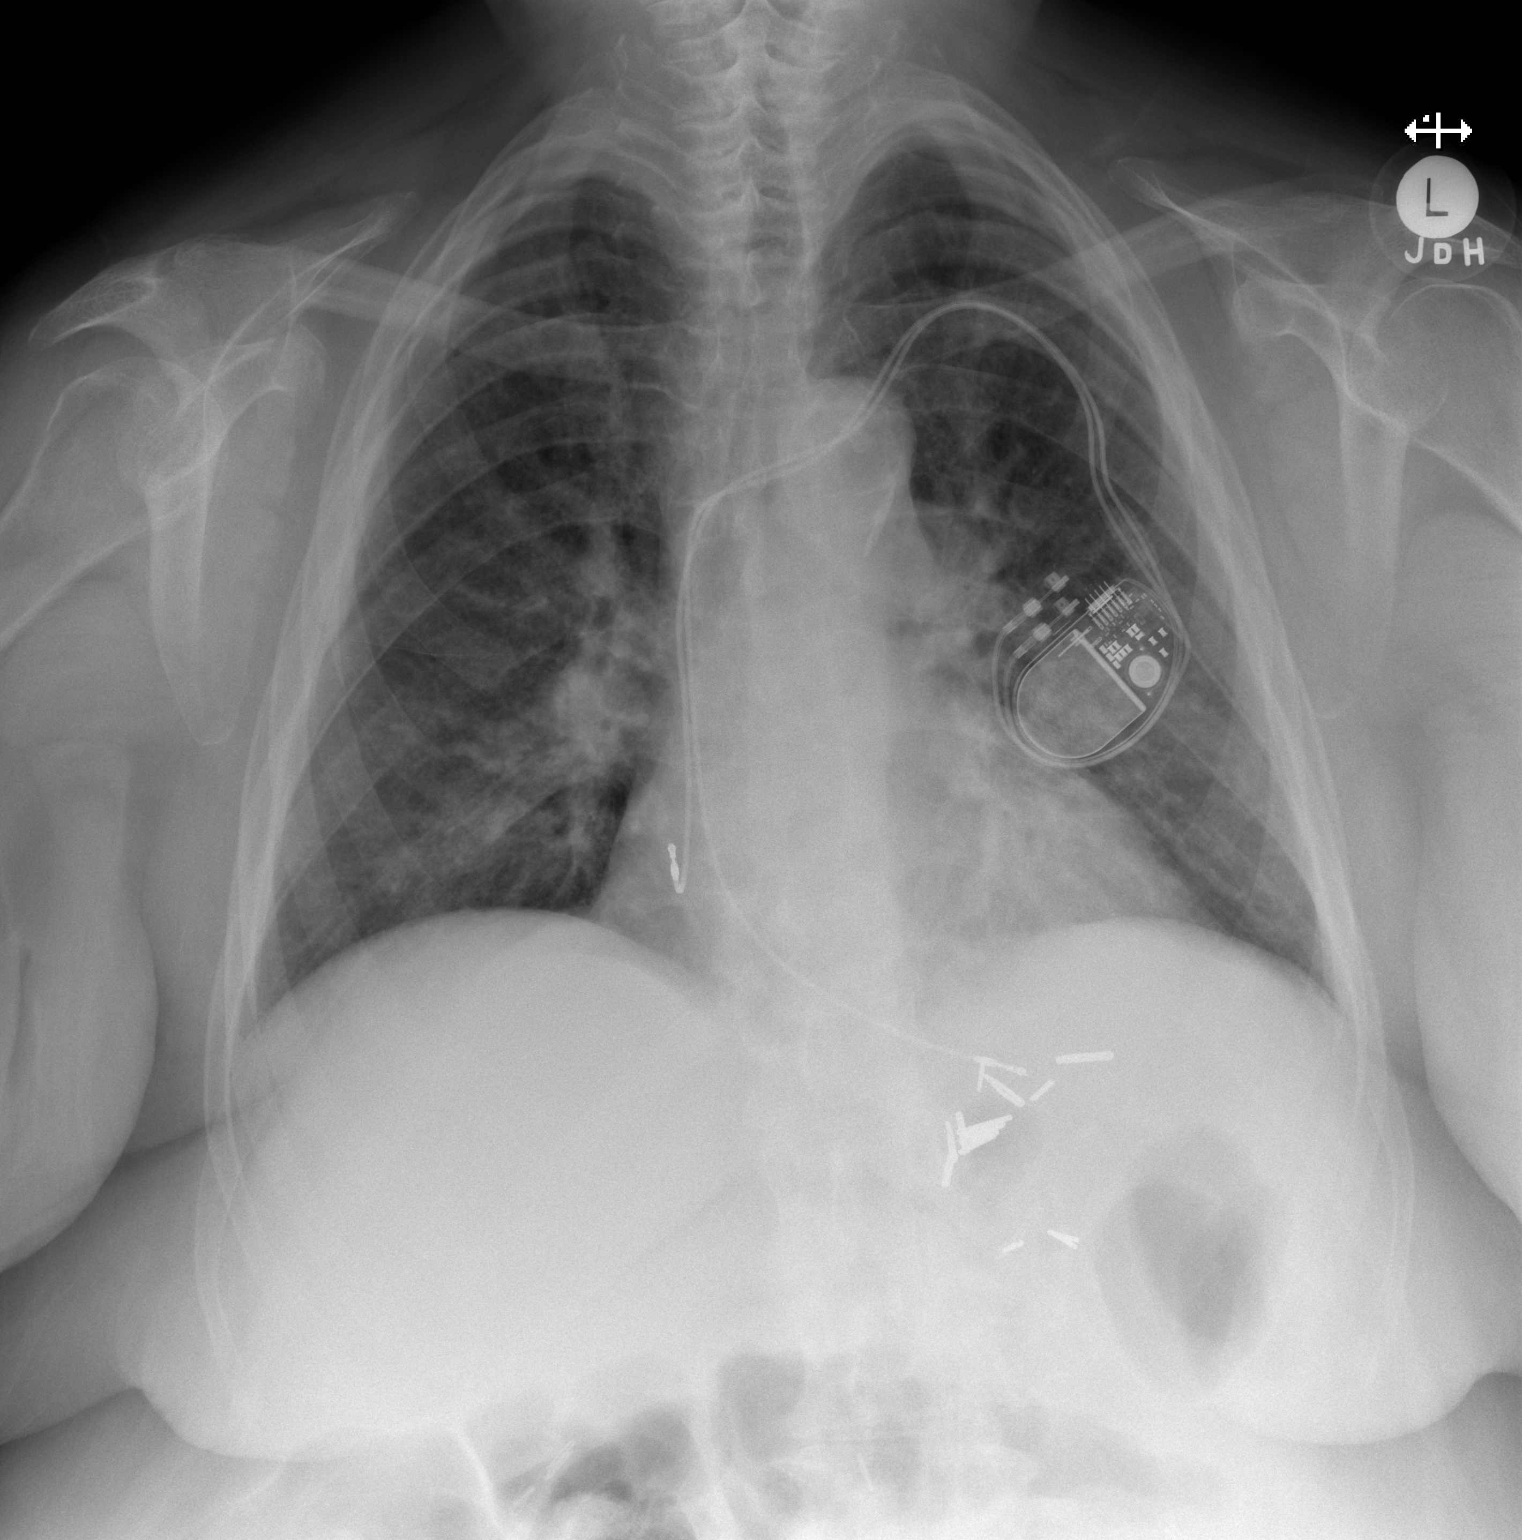

[w chest lat]
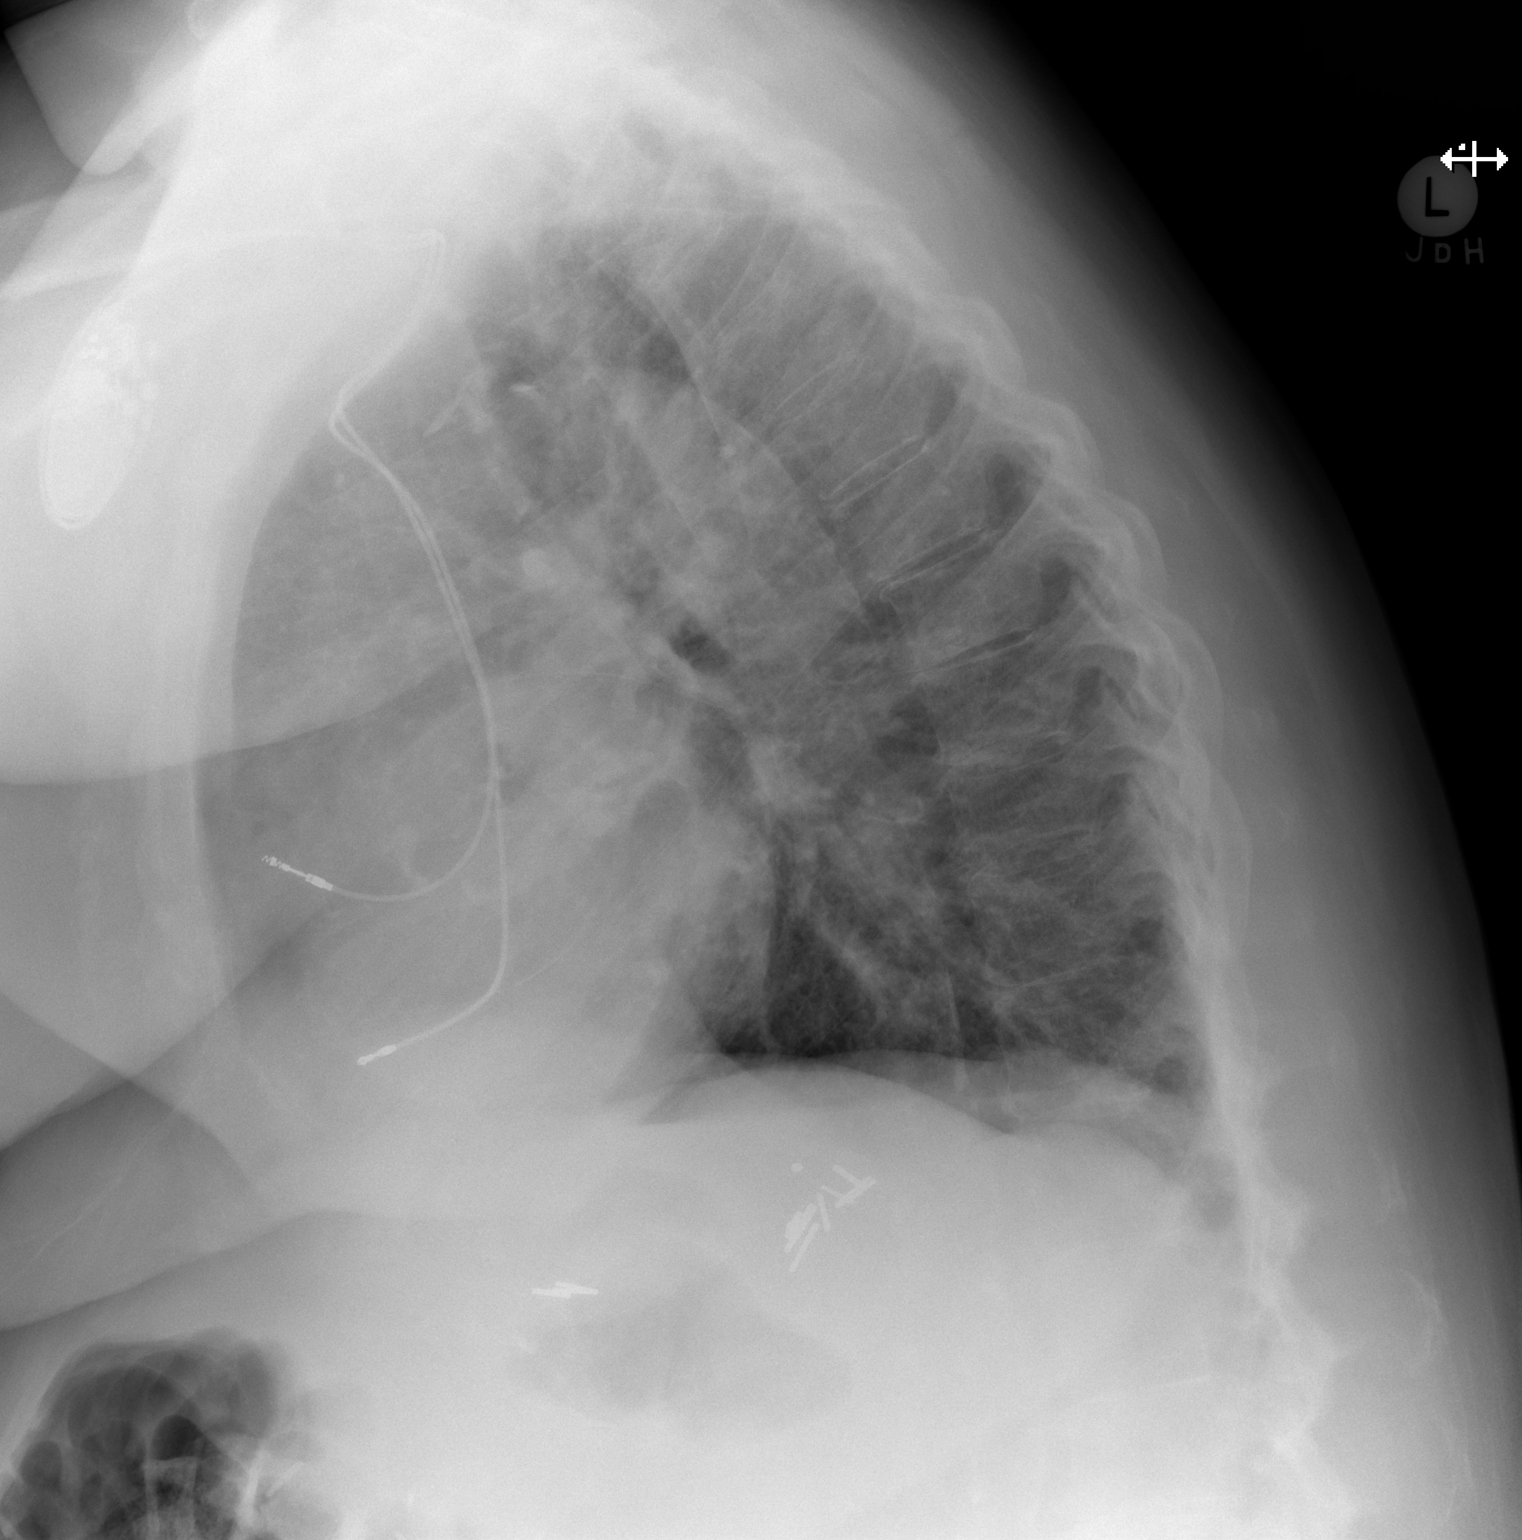

[2 of 2 positions shown; findings below may reference images not displayed]

FINDINGS: Heart size is normal. There is new pulmonary vascular congestion
without pleural effusions or discrete pulmonary edema.

Aortic atherosclerosis.  Pacemaker in place.

No significant bone abnormality.
IMPRESSION: New pulmonary vascular congestion.  No pulmonary edema or effusions.

Aortic Atherosclerosis (TI4P9-COS.S).

## 2020-03-17 ENCOUNTER — Ambulatory Visit (INDEPENDENT_AMBULATORY_CARE_PROVIDER_SITE_OTHER): Payer: Medicare Other | Admitting: *Deleted

## 2020-03-17 DIAGNOSIS — I441 Atrioventricular block, second degree: Secondary | ICD-10-CM | POA: Diagnosis not present

## 2020-03-17 LAB — CUP PACEART REMOTE DEVICE CHECK
Battery Remaining Longevity: 73 mo
Battery Remaining Percentage: 80 %
Battery Voltage: 2.93 V
Brady Statistic AP VP Percent: 87 %
Brady Statistic AP VS Percent: 1 %
Brady Statistic AS VP Percent: 12 %
Brady Statistic AS VS Percent: 1 %
Brady Statistic RA Percent Paced: 87 %
Brady Statistic RV Percent Paced: 99 %
Date Time Interrogation Session: 20210719064939
Implantable Lead Implant Date: 20150506
Implantable Lead Implant Date: 20150506
Implantable Lead Location: 753859
Implantable Lead Location: 753860
Implantable Pulse Generator Implant Date: 20150506
Lead Channel Impedance Value: 360 Ohm
Lead Channel Impedance Value: 450 Ohm
Lead Channel Pacing Threshold Amplitude: 0.75 V
Lead Channel Pacing Threshold Amplitude: 0.75 V
Lead Channel Pacing Threshold Pulse Width: 0.4 ms
Lead Channel Pacing Threshold Pulse Width: 0.4 ms
Lead Channel Sensing Intrinsic Amplitude: 0.5 mV
Lead Channel Sensing Intrinsic Amplitude: 5.7 mV
Lead Channel Setting Pacing Amplitude: 2.5 V
Lead Channel Setting Pacing Amplitude: 2.5 V
Lead Channel Setting Pacing Pulse Width: 0.4 ms
Lead Channel Setting Sensing Sensitivity: 4 mV
Pulse Gen Model: 2240
Pulse Gen Serial Number: 3013730

## 2020-03-19 NOTE — Progress Notes (Signed)
Remote pacemaker transmission.   

## 2020-04-16 ENCOUNTER — Other Ambulatory Visit: Payer: Self-pay | Admitting: Physician Assistant

## 2020-04-22 ENCOUNTER — Other Ambulatory Visit: Payer: Self-pay | Admitting: Physician Assistant

## 2020-05-19 ENCOUNTER — Encounter (HOSPITAL_BASED_OUTPATIENT_CLINIC_OR_DEPARTMENT_OTHER): Payer: Self-pay | Admitting: Emergency Medicine

## 2020-05-19 ENCOUNTER — Other Ambulatory Visit: Payer: Self-pay

## 2020-05-19 ENCOUNTER — Emergency Department (HOSPITAL_BASED_OUTPATIENT_CLINIC_OR_DEPARTMENT_OTHER)
Admission: EM | Admit: 2020-05-19 | Discharge: 2020-05-19 | Disposition: A | Discharge status: Home / Self Care | Payer: Medicare Other | Attending: Emergency Medicine | Admitting: Emergency Medicine

## 2020-05-19 DIAGNOSIS — Z79899 Other long term (current) drug therapy: Secondary | ICD-10-CM | POA: Diagnosis not present

## 2020-05-19 DIAGNOSIS — I110 Hypertensive heart disease with heart failure: Secondary | ICD-10-CM | POA: Insufficient documentation

## 2020-05-19 DIAGNOSIS — Z950 Presence of cardiac pacemaker: Secondary | ICD-10-CM | POA: Diagnosis not present

## 2020-05-19 DIAGNOSIS — Z91040 Latex allergy status: Secondary | ICD-10-CM | POA: Insufficient documentation

## 2020-05-19 DIAGNOSIS — I2510 Atherosclerotic heart disease of native coronary artery without angina pectoris: Secondary | ICD-10-CM | POA: Insufficient documentation

## 2020-05-19 DIAGNOSIS — I5043 Acute on chronic combined systolic (congestive) and diastolic (congestive) heart failure: Secondary | ICD-10-CM | POA: Diagnosis not present

## 2020-05-19 DIAGNOSIS — J449 Chronic obstructive pulmonary disease, unspecified: Secondary | ICD-10-CM | POA: Insufficient documentation

## 2020-05-19 DIAGNOSIS — Z87891 Personal history of nicotine dependence: Secondary | ICD-10-CM | POA: Insufficient documentation

## 2020-05-19 DIAGNOSIS — Z79890 Hormone replacement therapy (postmenopausal): Secondary | ICD-10-CM | POA: Diagnosis not present

## 2020-05-19 DIAGNOSIS — Z794 Long term (current) use of insulin: Secondary | ICD-10-CM | POA: Insufficient documentation

## 2020-05-19 DIAGNOSIS — E039 Hypothyroidism, unspecified: Secondary | ICD-10-CM | POA: Insufficient documentation

## 2020-05-19 DIAGNOSIS — Z7901 Long term (current) use of anticoagulants: Secondary | ICD-10-CM | POA: Insufficient documentation

## 2020-05-19 DIAGNOSIS — E1169 Type 2 diabetes mellitus with other specified complication: Secondary | ICD-10-CM | POA: Diagnosis not present

## 2020-05-19 DIAGNOSIS — R21 Rash and other nonspecific skin eruption: Secondary | ICD-10-CM | POA: Diagnosis not present

## 2020-05-19 DIAGNOSIS — L309 Dermatitis, unspecified: Secondary | ICD-10-CM

## 2020-05-19 DIAGNOSIS — Z85038 Personal history of other malignant neoplasm of large intestine: Secondary | ICD-10-CM | POA: Insufficient documentation

## 2020-05-19 HISTORY — DX: Personal history of other infectious and parasitic diseases: Z86.19

## 2020-05-19 MED ORDER — BETAMETHASONE DIPROPIONATE 0.05 % EX OINT: TOPICAL_OINTMENT | Freq: Two times a day (BID) | CUTANEOUS | 0 refills | Status: DC

## 2020-05-19 NOTE — ED Triage Notes (Signed)
Rash to back and abdomen since Friday.  Some itching and burning.  Appears herpetic

## 2020-05-19 NOTE — ED Provider Notes (Signed)
[facility] EMERGENCY DEPARTMENT Provider Note   CSN: [id number] Arrival date & time: 05/19/20  3825     History Chief Complaint  Patient presents with  . Rash    possible shingles, not oozing    Mary [name] is a 79 y.o. female.  HPI      79yo female with history below presents with concern for rash.  Rash present since Thursday or Friday, itchy and painful. Located across back bilaterally and abdomen. No fevers, no shortness of breath, no other associated symptoms. No new medications, no new soap or detergent.  No similar rashes in family.   Past Medical History:  Diagnosis Date  . Arthritis   . Atrial tachycardia ([place]) 04/23/2017  . CAD (coronary artery disease)    non obstructive  . 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 Procedure complicated by an atrial lead dislodgment and repeat procedure, 12/2013   . Carotid stenosis    Carotid US 5/16:  Bilateral ICA 1-39%; L vertebral retrograde; L BP 126/49, R BP 140/57  . Chronic combined systolic and diastolic CHF (congestive heart failure) ([place])   . Chronic lower back pain   . Colon cancer ([place])    a. s/p chemo  . Colon polyps   . Diabetic peripheral neuropathy ([place])   . DVT (deep venous thrombosis) ([place]) 2013   "twice behind knee on left side" (06/25/2013)  . Glaucoma   . History of chicken pox   . Hyperlipidemia   . Hypertension   . Hypothyroidism   . Iron deficiency anemia   . Multinodular goiter   . Osteoporosis   . PAF (paroxysmal atrial fibrillation) ([place])   . Sleep apnea    a. resolved post weight loss   . Type II diabetes mellitus [facility])     Patient Active Problem List   Diagnosis Date Noted  . Right wrist pain 10/05/2019  . OSA (obstructive sleep apnea) 06/07/2018  . COPD (chronic obstructive pulmonary disease) ([place]) 05/24/2018  . Acute on chronic combined systolic and diastolic CHF (congestive heart failure) ([place]) 05/24/2018  .  Acute respiratory failure with hypoxia ([place]) 05/21/2018  . Osteoarthritis 05/25/2017  . Atrial tachycardia ([place]) 04/23/2017  . Second degree AV block, Mobitz type II   . Depression 08/02/2016  . Chronic diastolic heart failure ([place]) 08/02/2016  . Tobacco abuse 08/02/2016  . Right lumbar radiculitis 06/09/2016  . Tear of medial meniscus of right knee, current 04/22/2016  . Chronic venous insufficiency 02/21/2015  . 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 Atrioventricular block, complete ([place]) 01/02/2014  . Chronic anticoagulation 10/09/2013  . PAF (paroxysmal atrial fibrillation) ([place]) 08/29/2013  . Obesity 05/05/2013  . CAD in native artery   . Diabetes mellitus with complication ([place])   . Hypothyroidism   . Hypertension   . Colon cancer ([place])   . Hyperlipidemia     Past Surgical History:  Procedure Laterality Date  . CARDIAC CATHETERIZATION  2006   Never had PCI, 3 caths total. Last one in [place]  . CARDIAC CATHETERIZATION N/A 02/04/2015   Procedure: Left Heart Cath and Coronary Angiography;  Surgeon: Belva Crome, MD;  Location: [facility] CV LAB;  Service: Cardiovascular;  Laterality: N/A;  . CATARACT EXTRACTION, BILATERAL Bilateral 2013   "and put stent in my left eye for glaucoma" (06/25/2013)  . CHOLECYSTECTOMY  1980's  . COLECTOMY  10/2010  Tumor removal  . EYE MUSCLE SURGERY Bilateral ~ 1963   "muscles too long; eyes would go out and up; tied muscles to hold my eyes straight" (06/25/2013)  . LEAD REVISION  01-03-2014   atrial lead revision by Dr Rayann Heman  . LEAD REVISION N/A 01/03/2014   Procedure: LEAD REVISION;  Surgeon: Coralyn Mark, MD;  Location: [facility] CATH LAB;  Service: Cardiovascular;  Laterality: N/A;  . LEFT HEART CATH AND CORONARY ANGIOGRAPHY N/A 05/29/2018   Procedure: LEFT HEART CATH AND CORONARY ANGIOGRAPHY;  Surgeon: Belva Crome, MD;  Location: [facility] CV LAB;  Service: Cardiovascular;  Laterality: N/A;  .  PACEMAKER INSERTION  01/02/2014   STJ Assurity dual chamber pacemaker implnated by Dr Lovena Le for SSS  . PERMANENT PACEMAKER INSERTION N/A 01/02/2014   Procedure: PERMANENT PACEMAKER INSERTION;  Surgeon: Evans Lance, MD;  Location: [facility] CATH LAB;  Service: Cardiovascular;  Laterality: N/A;  . PORTACATH PLACEMENT Left 11/2010  . ROUX-EN-Y GASTRIC BYPASS  2009  . ULNAR TUNNEL RELEASE Left ~ 2008  . UMBILICAL HERNIA REPAIR  1970's?    (06/25/2013)     OB History   No obstetric history on file.     Family History  Problem Relation Age of Onset  . Breast cancer Mother   . Diabetes Mother   . Heart disease Father   . Heart attack Father   . Heart attack Sister   . Heart attack Brother   . Bladder Cancer Brother   . Prostate cancer Brother   . Colon polyps Brother   . Breast cancer Sister   . Breast cancer Maternal Aunt        x 2 aunts  . Pancreatic cancer Brother   . Colon polyps Brother   . Bladder Cancer Cousin   . Clotting disorder Sister   . Diabetes Sister        x 3  . Diabetes Maternal Grandmother     Social History   Tobacco Use  . Smoking status: Former Smoker    Packs/day: 1.00    Years: 30.00    Pack years: 30.00    Types: Cigarettes    Quit date: 02/2017    Years since quitting: 3.2  . Smokeless tobacco: Never Used  . Tobacco comment: started in her [age] until 2009   Vaping Use  . Vaping Use: Never used  Substance Use Topics  . Alcohol use: Not Currently    Alcohol/week: 0.0 standard drinks  . Drug use: No    Home Medications Prior to Admission medications   Medication Sig Start Date End Date Taking? Authorizing Provider  albuterol (PROVENTIL HFA;VENTOLIN HFA) 108 (90 Base) MCG/ACT inhaler Inhale 2 puffs into the lungs every 6 (six) hours as needed for wheezing or shortness of breath. 05/30/18   Regalado, Belkys A, MD  atorvastatin (LIPITOR) 80 MG tablet TAKE 1 TABLET(80 MG) BY MOUTH DAILY 09/17/19   Hoyt Koch, MD  betamethasone  dipropionate (DIPROLENE) 0.05 % ointment Apply topically 2 (two) times daily. 05/19/20   Gareth Morgan, MD  bimatoprost (LUMIGAN) 0.01 % SOLN Place 1 drop into the right eye at bedtime.     [provider]  buPROPion (WELLBUTRIN XL) 150 MG 24 hr tablet TAKE 1 TABLET(150 MG) BY MOUTH DAILY 04/23/19   Hoyt Koch, MD  ELIQUIS 5 MG TABS tablet TAKE 1 TABLET BY MOUTH TWICE A DAY 11/10/18   Allred, Jeneen Rinks, MD  gabapentin (NEURONTIN) 300 MG capsule Take 300 mg by  mouth every evening.     [provider]  HYDROcodone-acetaminophen (NORCO/VICODIN) 5-325 MG tablet Take 1 tablet by mouth every 4 (four) hours as needed for moderate pain. 11/20/19   Marrian Salvage, FNP  Insulin Glargine (BASAGLAR KWIKPEN) 100 UNIT/ML SOPN Inject 0.2 mLs (20 Units total) into the skin daily. 05/30/18   Regalado, Belkys A, MD  levothyroxine (SYNTHROID, LEVOTHROID) 125 MCG tablet Take 125 mcg by mouth daily before breakfast. 01/10/18   [provider]  metoprolol succinate (TOPROL-XL) 25 MG 24 hr tablet TAKE 1/2 TABLETS AT BEDTIME 08/13/19   Belva Crome, MD  nitroGLYCERIN (NITROSTAT) 0.4 MG SL tablet Place 1 tablet (0.4 mg total) under the tongue every 5 (five) minutes as needed for chest pain. 04/09/19   Belva Crome, MD  Potassium Chloride ER 20 MEQ TBCR TAKE 2 TABLETS BY MOUTH TWICE A DAY 04/23/20   Belva Crome, MD  potassium chloride SA (K-DUR) 20 MEQ tablet Take 2 tablets (40 mEq total) by mouth 2 (two) times daily. 02/28/19   Richardson Dopp T, PA-C  spironolactone (ALDACTONE) 25 MG tablet Take 0.5 tablets (12.5 mg total) by mouth daily. 01/07/20   Belva Crome, MD  torsemide (DEMADEX) 20 MG tablet TAKE 1 TABLET BY MOUTH TWICE A DAY 04/16/20   Allred, Jeneen Rinks, MD    Allergies    Other, Adhesive [tape], Aspirin, Latex, and Nsaids  Review of Systems   Review of Systems  Constitutional: Negative for fever.  HENT: Negative for sore throat.   Eyes: Negative for visual disturbance.    Respiratory: Negative for cough and shortness of breath.   Cardiovascular: Negative for chest pain.  Gastrointestinal: Negative for abdominal pain, nausea and vomiting.  Genitourinary: Negative for difficulty urinating.  Musculoskeletal: Negative for back pain and neck pain.  Skin: Positive for rash.  Neurological: Negative for syncope and headaches.    Physical Exam Updated Vital Signs BP (!) 147/68   Pulse 72   Temp 98.9 F (37.2 C) (Oral)   Resp 16   Ht 5\' 7"  (1.702 m)   Wt 101.2 kg   SpO2 100%   BMI 34.94 kg/m   Physical Exam Vitals and nursing note reviewed.  Constitutional:      General: She is not in acute distress.    Appearance: Normal appearance. She is not ill-appearing, toxic-appearing or diaphoretic.  HENT:     Head: Normocephalic.  Eyes:     Conjunctiva/sclera: Conjunctivae normal.  Cardiovascular:     Rate and Rhythm: Normal rate and regular rhythm.     Pulses: Normal pulses.  Pulmonary:     Effort: Pulmonary effort is normal. No respiratory distress.  Musculoskeletal:        General: No deformity or signs of injury.     Cervical back: No rigidity.  Skin:    General: Skin is warm and dry.     Coloration: Skin is not jaundiced or pale.     Findings: Erythema (1cm erythematous plaques present over bilareral back scattered, scattered across abdomen, per pt similar on upper legs) and rash present.  Neurological:     General: No focal deficit present.     Mental Status: She is alert and oriented to person, place, and time.       ED Results / Procedures / Treatments   Labs (all labs ordered are listed, but only abnormal results are displayed) Labs Reviewed - No data to display  EKG None  Radiology No results found.  Procedures  Procedures (including critical care time)  Medications Ordered in ED Medications - No data to display  ED Course  I have reviewed the triage vital signs and the nursing notes.  Pertinent labs & imaging results  that were available during my care of the patient were reviewed by me and considered in my medical decision making (see chart for details).    MDM Rules/Calculators/A&P                           79yo female with history below presents with concern for rash.   Rash does not have the appearance of SSS, TEN, erythroderma, scabies, RMSF or hives.   No new medications. Possible contact dermatitis or atopic dermatitis. Distribution and appearance not consistent with shingles. Given rx for betamethasone.  Recommend follow up with PCP.    Final Clinical Impression(s) / ED Diagnoses Final diagnoses:  Rash  Dermatitis    Rx / DC Orders ED Discharge Orders         Ordered    betamethasone dipropionate (DIPROLENE) 0.05 % ointment  2 times daily        05/19/20 1231           Gareth Morgan, MD 05/19/20 2237

## 2020-05-20 ENCOUNTER — Other Ambulatory Visit: Payer: Self-pay | Admitting: Internal Medicine

## 2020-05-20 MED ORDER — APIXABAN 5 MG PO TABS
5.0000 mg | ORAL_TABLET | Freq: Two times a day (BID) | ORAL | 1 refills | Status: DC
Start: 2020-05-20 — End: 2020-11-03

## 2020-05-20 NOTE — Telephone Encounter (Signed)
Eliquis 5mg  refill request received. Patient is 79 years old, weight-101.2kg, Crea-1.21 on 01/16/2020 via [facility] at [place], [place], and last seen by Dr. [name] on 11/02/2019. Dose is appropriate based on dosing criteria. Will send in refill to requested pharmacy.

## 2020-05-20 NOTE — Telephone Encounter (Signed)
   *  STAT* If patient is at the pharmacy, call can be transferred to refill team.   1. Which medications need to be refilled? (please list name of each medication and dose if known) ELIQUIS 5 MG TABS tablet  2. Which pharmacy/location (including street and city if local pharmacy) is medication to be sent to? [facility] , [address] [place], [place] [address]  3. Do they need a 30 day or 90 day supply? 90 days   Pt is completely put of medication

## 2020-05-24 NOTE — Progress Notes (Signed)
Cardiology Office Note:    Date:  05/29/2020   ID:  Mary [name], DOB 1940-11-14, MRN [id number]  PCP:  Hoyt Koch, MD  Cardiologist:  Sinclair Grooms, MD   Referring MD: Hoyt Koch, *   Chief Complaint  Patient presents with  . Atrial Fibrillation  . Congestive Heart Failure    History of Present Illness:    Mary [name] is a 79 y.o. 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  "I feel great and I feel like I am wasting your time".  Should I cut my fluid medicine back to 20 mg twice daily from 40 mg a.m. and 20 mg p.m. daily?Marland Kitchen  She feels better because she is breathing better.  She is not having swelling.  She can lie down in bed.  She is purposefully cut back on sodium and fluid intake in her diet.  Past Medical History:  Diagnosis Date  . Arthritis   . Atrial tachycardia ([place]) 04/23/2017  . CAD (coronary artery disease)    non obstructive  . 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 Procedure complicated by an atrial lead dislodgment and repeat procedure, 12/2013   . Carotid stenosis    Carotid US 5/16:  Bilateral ICA 1-39%; L vertebral retrograde; L BP 126/49, R BP 140/57  . Chronic combined systolic and diastolic CHF (congestive heart failure) ([place])   . Chronic lower back pain   . Colon cancer ([place])    a. s/p chemo  . Colon polyps   . Diabetic peripheral neuropathy ([place])   . DVT (deep venous thrombosis) ([place]) 2013   "twice behind knee on left side" (06/25/2013)  . Glaucoma   . History of chicken pox   . Hyperlipidemia   . Hypertension   . Hypothyroidism   . Iron deficiency anemia   . Multinodular goiter   . Osteoporosis   . PAF (paroxysmal atrial fibrillation) ([place])   . Sleep apnea    a. resolved post weight  loss   . Type II diabetes mellitus ([place])     Past Surgical History:  Procedure Laterality Date  . CARDIAC CATHETERIZATION  2006   Never had PCI, 3 caths total. Last one in [place]  . CARDIAC CATHETERIZATION N/A 02/04/2015   Procedure: Left Heart Cath and Coronary Angiography;  Surgeon: Belva Crome, MD;  Location: [facility];  Service: Cardiovascular;  Laterality: N/A;  . CATARACT EXTRACTION, BILATERAL Bilateral 2013   "and put stent in my left eye for glaucoma" (06/25/2013)  . CHOLECYSTECTOMY  1980's  . COLECTOMY  10/2010   Tumor removal  . EYE MUSCLE SURGERY Bilateral ~ 1963   "muscles too long; eyes would go out and up; tied muscles to hold my eyes straight" (06/25/2013)  . LEAD REVISION  01-03-2014   atrial lead revision by Dr Rayann Heman  . LEAD REVISION N/A 01/03/2014   Procedure: LEAD REVISION;  Surgeon: Coralyn Mark, MD;  Location: [facility];  Service: Cardiovascular;  Laterality: N/A;  . LEFT HEART CATH AND CORONARY ANGIOGRAPHY N/A 05/29/2018   Procedure: LEFT HEART CATH AND CORONARY ANGIOGRAPHY;  Surgeon: Belva Crome, MD;  Location: [facility];  Service: Cardiovascular;  Laterality: N/A;  . PACEMAKER INSERTION  01/02/2014   STJ Assurity  dual chamber pacemaker implnated by Dr Lovena Le for SSS  . PERMANENT PACEMAKER INSERTION N/A 01/02/2014   Procedure: PERMANENT PACEMAKER INSERTION;  Surgeon: Evans Lance, MD;  Location[facility];  Service: Cardiovascular;  Laterality: N/A;  . PORTACATH PLACEMENT Left 11/2010  . ROUX-EN-Y GASTRIC BYPASS  2009  . ULNAR TUNNEL RELEASE Left ~ 2008  . UMBILICAL HERNIA REPAIR  1970's?    (06/25/2013)    Current Medications: Current Meds  Medication Sig  . albuterol (PROVENTIL HFA;VENTOLIN HFA) 108 (90 Base) MCG/ACT inhaler Inhale 2 puffs into the lungs every 6 (six) hours as needed for wheezing or shortness of breath.  Marland Kitchen apixaban (ELIQUIS) 5 MG TABS tablet Take 1 tablet (5 mg total) by mouth 2 (two) times daily.  Marland Kitchen atorvastatin  (LIPITOR) 80 MG tablet TAKE 1 TABLET(80 MG) BY MOUTH DAILY  . betamethasone dipropionate (DIPROLENE) 0.05 % ointment Apply topically 2 (two) times daily.  . bimatoprost (LUMIGAN) 0.01 % SOLN Place 1 drop into the right eye at bedtime.   Marland Kitchen buPROPion (WELLBUTRIN XL) 150 MG 24 hr tablet TAKE 1 TABLET(150 MG) BY MOUTH DAILY  . HYDROcodone-acetaminophen (NORCO/VICODIN) 5-325 MG tablet Take 1 tablet by mouth every 4 (four) hours as needed for moderate pain.  Marland Kitchen insulin glargine (LANTUS) 100 UNIT/ML injection Inject into the skin daily. Sliding Scale  . levothyroxine (SYNTHROID, LEVOTHROID) 125 MCG tablet Take 125 mcg by mouth daily before breakfast.  . metoprolol succinate (TOPROL-XL) 25 MG 24 hr tablet TAKE 1/2 TABLETS AT BEDTIME  . nitroGLYCERIN (NITROSTAT) 0.4 MG SL tablet Place 1 tablet (0.4 mg total) under the tongue every 5 (five) minutes as needed for chest pain.  . potassium chloride SA (K-DUR) 20 MEQ tablet Take 2 tablets (40 mEq total) by mouth 2 (two) times daily.  Marland Kitchen spironolactone (ALDACTONE) 25 MG tablet Take 0.5 tablets (12.5 mg total) by mouth daily.  Marland Kitchen torsemide (DEMADEX) 20 MG tablet Take 20 mg by mouth 3 (three) times daily. 2 in the AM; 1 in the PM     Allergies:   Other, Adhesive [tape], Aspirin, Latex, and Nsaids   Social History   Socioeconomic History  . Marital status: Married    Spouse name: [name]  . Number of children: 0  . Years of education: Not on file  . Highest education level: Not on file  Occupational History  . Occupation: Retired    Comment: office work  Tobacco Use  . Smoking status: Former Smoker    Packs/day: 1.00    Years: 30.00    Pack years: 30.00    Types: Cigarettes    Quit date: 02/2017    Years since quitting: 3.2  . Smokeless tobacco: Never Used  . Tobacco comment: started in her [age] until 2009   Vaping Use  . Vaping Use: Never used  Substance and Sexual Activity  . Alcohol use: Not Currently    Alcohol/week: 0.0 standard drinks  .  Drug use: No  . Sexual activity: Yes  Other Topics Concern  . Not on file  Social History Narrative   Married to husband, [name]   No children   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   Recently moved back here from [place] Strain:   . Difficulty of Paying Living Expenses: Not on file  Food Insecurity:   . Worried About Charity fundraiser in the Last Year: Not on file  .  Ran Out of Food in the Last Year: Not on file  Transportation Needs:   . Lack of Transportation (Medical): Not on file  . Lack of Transportation (Non-Medical): Not on file  Physical Activity:   . Days of Exercise per Week: Not on file  . Minutes of Exercise per Session: Not on file  Stress:   . Feeling of Stress : Not on file  Social Connections:   . Frequency of Communication with Friends and Family: Not on file  . Frequency of Social Gatherings with Friends and Family: Not on file  . Attends Religious Services: Not on file  . Active Member of Clubs or Organizations: Not on file  . Attends Archivist Meetings: Not on file  . Marital Status: Not on file     Family History: The patient's family history includes Bladder Cancer in her brother and cousin; Breast cancer in her maternal aunt, mother, and sister; Clotting disorder in her sister; Colon polyps in her brother and brother; Diabetes in her maternal grandmother, mother, and sister; Heart attack in her brother, father, and sister; Heart disease in her father; Pancreatic cancer in her brother; Prostate cancer in her brother.  ROS:   Please see the history of present illness.    She feels somewhat sad at times because her back gives continuous discomfort.  But all in all she is excepting of this.  All other systems reviewed and are negative.  EKGs/Labs/Other Studies Reviewed:    The following studies were reviewed today:  LDL cholesterol was 104 in May, creatinine 1.30 Dec 2019 and hemoglobin A1c 11.6.  EKG:  EKG AV sequential pacing.  When compared to tracing from 02/15/2019, noted  Recent Labs: No results found for requested labs within last 8760 hours.  Recent Lipid Panel    Component Value Date/Time   CHOL 124 05/25/2018 0531   TRIG 81 05/25/2018 0531   HDL 35 (L) 05/25/2018 0531   CHOLHDL 3.5 05/25/2018 0531   VLDL 16 05/25/2018 0531   LDLCALC 73 05/25/2018 0531   LDLDIRECT 79.0 03/23/2016 1529    Physical Exam:    VS:  BP (!) 104/58   Pulse 70   Ht 5\' 7"  (1.702 m)   Wt 215 lb (97.5 kg)   BMI 33.67 kg/m     Wt Readings from Last 3 Encounters:  05/29/20 215 lb (97.5 kg)  05/19/20 223 lb 1.7 oz (101.2 kg)  11/20/19 223 lb (101.2 kg)     GEN: Losing weight. No acute distress HEENT: Normal NECK: No JVD. LYMPHATICS: No lymphadenopathy CARDIAC:  RRR without murmur, gallop, or edema. VASCULAR:  Normal Pulses. No bruits. RESPIRATORY:  Clear to auscultation without rales, wheezing or rhonchi  ABDOMEN: Soft, non-tender, non-distended, No pulsatile mass, MUSCULOSKELETAL: No deformity  SKIN: Warm and dry NEUROLOGIC:  Alert and oriented x 3 PSYCHIATRIC:  Normal affect   ASSESSMENT:    1. PAF (paroxysmal atrial fibrillation) ([place])   2. Essential hypertension   3. Atrioventricular block, complete ([place])   4. Coronary artery disease of native artery of native heart with stable angina pectoris ([place])   5. Chronic anticoagulation   6. Chronic diastolic heart failure ([place])   7. Chronic obstructive pulmonary disease, unspecified COPD type ([place])   8. Tobacco abuse   9. Educated about COVID-19 virus infection    PLAN:    In order of problems listed above:  1. She is in sinus rhythm today with AV sequential pacing on Toprol-XL 25 mg/day  2. Blood pressure is lowish.  We will determine if we should continue Aldactone 12.5 mg daily and Demadex 40 in the morning and 20 in the evening.  A basic metabolic panel and BNP will be  performed. 3. Pacer is functioning normally based upon the EKG 4. No symptoms of angina.  We reviewed secondary prevention.  She is unable to exercise because of her back. 5. Continue Eliquis 5 mg twice daily.  Adjustment if kidney function is abnormal today. 6. Continue current diuretic regimen.  Consider decreasing diuretic intensity.  I believe the patient has cut back on sodium intake and fluid intake. 7. Did not discuss 8. Continues to smoke 9. Looking forward to getting a booster for the COVID-19 vaccine.  Plan 73-month follow-up   Medication Adjustments/Labs and Tests Ordered: Current medicines are reviewed at length with the patient today.  Concerns regarding medicines are outlined above.  Orders Placed This Encounter  Procedures  . EKG 12-Lead   No orders of the defined types were placed in this encounter.   Patient Instructions  Medication Instructions:  Your physician recommends that you continue on your current medications as directed. Please refer to the Current Medication list given to you today.  *If you need a refill on your cardiac medications before your next appointment, please call your pharmacy*   Lab Work: BMET, BNP today  If you have labs (blood work) drawn today and your tests are completely normal, you will receive your results only by: Marland Kitchen MyChart Message (if you have MyChart) OR . A paper copy in the mail If you have any lab test that is abnormal or we need to change your treatment, we will call you to review the results.   Testing/Procedures: None   Follow-Up: At [facility], you and your health needs are our priority.  As part of our continuing mission to provide you with exceptional heart care, we have created designated Provider Care Teams.  These Care Teams include your primary Cardiologist (physician) and Advanced Practice Providers (APPs -  Physician Assistants and Nurse Practitioners) who all work together to provide you with the care you  need, when you need it.  We recommend signing up for the patient portal called "MyChart".  Sign up information is provided on this After Visit Summary.  MyChart is used to connect with patients for Virtual Visits (Telemedicine).  Patients are able to view lab/test results, encounter notes, upcoming appointments, etc.  Non-urgent messages can be sent to your provider as well.   To learn more about what you can do with MyChart, go to [email or web address].    Your next appointment:   6 month(s)  The format for your next appointment:   In Person  Provider:   You may see Sinclair Grooms, MD or one of the following Advanced Practice Providers on your designated Care Team:    Truitt Merle, NP  Cecilie Kicks, NP  Kathyrn Drown, NP    Other Instructions      Signed, Sinclair Grooms, MD  05/29/2020 4:04 PM    [place]

## 2020-05-29 ENCOUNTER — Ambulatory Visit (INDEPENDENT_AMBULATORY_CARE_PROVIDER_SITE_OTHER): Payer: Medicare Other | Admitting: Interventional Cardiology

## 2020-05-29 ENCOUNTER — Other Ambulatory Visit: Payer: Self-pay

## 2020-05-29 ENCOUNTER — Encounter: Payer: Self-pay | Admitting: Interventional Cardiology

## 2020-05-29 VITALS — BP 104/58 | HR 70 | Ht 67.0 in | Wt 215.0 lb

## 2020-05-29 DIAGNOSIS — Z7901 Long term (current) use of anticoagulants: Secondary | ICD-10-CM | POA: Diagnosis not present

## 2020-05-29 DIAGNOSIS — Z720 Tobacco use: Secondary | ICD-10-CM

## 2020-05-29 DIAGNOSIS — I442 Atrioventricular block, complete: Secondary | ICD-10-CM | POA: Diagnosis not present

## 2020-05-29 DIAGNOSIS — I5032 Chronic diastolic (congestive) heart failure: Secondary | ICD-10-CM

## 2020-05-29 DIAGNOSIS — I25118 Atherosclerotic heart disease of native coronary artery with other forms of angina pectoris: Secondary | ICD-10-CM | POA: Diagnosis not present

## 2020-05-29 DIAGNOSIS — Z7189 Other specified counseling: Secondary | ICD-10-CM

## 2020-05-29 DIAGNOSIS — J449 Chronic obstructive pulmonary disease, unspecified: Secondary | ICD-10-CM

## 2020-05-29 DIAGNOSIS — I480 Paroxysmal atrial fibrillation: Secondary | ICD-10-CM | POA: Diagnosis not present

## 2020-05-29 DIAGNOSIS — I10 Essential (primary) hypertension: Secondary | ICD-10-CM

## 2020-05-29 NOTE — Patient Instructions (Signed)
Medication Instructions:  Your physician recommends that you continue on your current medications as directed. Please refer to the Current Medication list given to you today.  *If you need a refill on your cardiac medications before your next appointment, please call your pharmacy*   Lab Work: BMET, BNP today  If you have labs (blood work) drawn today and your tests are completely normal, you will receive your results only by: Marland Kitchen MyChart Message (if you have MyChart) OR . A paper copy in the mail If you have any lab test that is abnormal or we need to change your treatment, we will call you to review the results.   Testing/Procedures: None   Follow-Up: At [facility], you and your health needs are our priority.  As part of our continuing mission to provide you with exceptional heart care, we have created designated Provider Care Teams.  These Care Teams include your primary Cardiologist (physician) and Advanced Practice Providers (APPs -  Physician Assistants and Nurse Practitioners) who all work together to provide you with the care you need, when you need it.  We recommend signing up for the patient portal called "MyChart".  Sign up information is provided on this After Visit Summary.  MyChart is used to connect with patients for Virtual Visits (Telemedicine).  Patients are able to view lab/test results, encounter notes, upcoming appointments, etc.  Non-urgent messages can be sent to your provider as well.   To learn more about what you can do with MyChart, go to [email or web address].    Your next appointment:   6 month(s)  The format for your next appointment:   In Person  Provider:   You may see Sinclair Grooms, MD or one of the following Advanced Practice Providers on your designated Care Team:    Truitt Merle, NP  Cecilie Kicks, NP  Kathyrn Drown, NP    Other Instructions

## 2020-05-30 LAB — BASIC METABOLIC PANEL
BUN/Creatinine Ratio: 19 (ref 12–28)
BUN: 20 mg/dL (ref 8–27)
CO2: 30 mmol/L — ABNORMAL HIGH (ref 20–29)
Calcium: 9.3 mg/dL (ref 8.7–10.3)
Chloride: 97 mmol/L (ref 96–106)
Creatinine, Ser: 1.08 mg/dL — ABNORMAL HIGH (ref 0.57–1.00)
GFR calc Af Amer: 57 mL/min/{1.73_m2} — ABNORMAL LOW (ref >59)
GFR calc non Af Amer: 49 mL/min/{1.73_m2} — ABNORMAL LOW (ref >59)
Glucose: 234 mg/dL — ABNORMAL HIGH (ref 65–99)
Potassium: 4.4 mmol/L (ref 3.5–5.2)
Sodium: 140 mmol/L (ref 134–144)

## 2020-05-30 LAB — PRO B NATRIURETIC PEPTIDE: NT-Pro BNP: 1027 pg/mL — ABNORMAL HIGH (ref 0–738)

## 2020-06-16 ENCOUNTER — Ambulatory Visit (INDEPENDENT_AMBULATORY_CARE_PROVIDER_SITE_OTHER): Payer: Medicare Other

## 2020-06-16 DIAGNOSIS — I441 Atrioventricular block, second degree: Secondary | ICD-10-CM | POA: Diagnosis not present

## 2020-06-17 LAB — CUP PACEART REMOTE DEVICE CHECK
Battery Remaining Longevity: 65 mo
Battery Remaining Percentage: 72 %
Battery Voltage: 2.92 V
Brady Statistic AP VP Percent: 89 %
Brady Statistic AP VS Percent: 1 %
Brady Statistic AS VP Percent: 11 %
Brady Statistic AS VS Percent: 1 %
Brady Statistic RA Percent Paced: 89 %
Brady Statistic RV Percent Paced: 99 %
Date Time Interrogation Session: 20211018093006
Implantable Lead Implant Date: 20150506
Implantable Lead Implant Date: 20150506
Implantable Lead Location: 753859
Implantable Lead Location: 753860
Implantable Pulse Generator Implant Date: 20150506
Lead Channel Impedance Value: 360 Ohm
Lead Channel Impedance Value: 450 Ohm
Lead Channel Pacing Threshold Amplitude: 0.75 V
Lead Channel Pacing Threshold Amplitude: 0.75 V
Lead Channel Pacing Threshold Pulse Width: 0.4 ms
Lead Channel Pacing Threshold Pulse Width: 0.4 ms
Lead Channel Sensing Intrinsic Amplitude: 0.5 mV
Lead Channel Sensing Intrinsic Amplitude: 12 mV
Lead Channel Setting Pacing Amplitude: 2.5 V
Lead Channel Setting Pacing Amplitude: 2.5 V
Lead Channel Setting Pacing Pulse Width: 0.4 ms
Lead Channel Setting Sensing Sensitivity: 4 mV
Pulse Gen Model: 2240
Pulse Gen Serial Number: 3013730

## 2020-06-19 ENCOUNTER — Telehealth: Payer: Self-pay | Admitting: Internal Medicine

## 2020-06-19 MED ORDER — BUPROPION HCL ER (XL) 150 MG PO TB24: ORAL_TABLET | ORAL | 1 refills | Status: DC

## 2020-06-19 NOTE — Telephone Encounter (Signed)
buPROPion (WELLBUTRIN XL) 150 MG 24 hr tablet [id number] [place], [place] - [address], [address] AT [facility] [address] Phone:  [phone]  Fax:  [phone]     Patient requesting a refill  Last seen- 11/20/19 Next apt- N/A

## 2020-06-20 NOTE — Progress Notes (Signed)
Remote pacemaker transmission.   

## 2020-07-31 ENCOUNTER — Ambulatory Visit (INDEPENDENT_AMBULATORY_CARE_PROVIDER_SITE_OTHER): Payer: Medicare Other | Admitting: Internal Medicine

## 2020-07-31 ENCOUNTER — Other Ambulatory Visit: Payer: Self-pay

## 2020-07-31 ENCOUNTER — Encounter: Payer: Self-pay | Admitting: Internal Medicine

## 2020-07-31 VITALS — BP 132/74 | HR 73 | Temp 98.4°F | Ht 67.0 in | Wt 217.0 lb

## 2020-07-31 DIAGNOSIS — I5043 Acute on chronic combined systolic (congestive) and diastolic (congestive) heart failure: Secondary | ICD-10-CM | POA: Diagnosis not present

## 2020-07-31 DIAGNOSIS — E785 Hyperlipidemia, unspecified: Secondary | ICD-10-CM

## 2020-07-31 DIAGNOSIS — E118 Type 2 diabetes mellitus with unspecified complications: Secondary | ICD-10-CM

## 2020-07-31 DIAGNOSIS — M8949 Other hypertrophic osteoarthropathy, multiple sites: Secondary | ICD-10-CM | POA: Diagnosis not present

## 2020-07-31 DIAGNOSIS — I25118 Atherosclerotic heart disease of native coronary artery with other forms of angina pectoris: Secondary | ICD-10-CM | POA: Diagnosis not present

## 2020-07-31 DIAGNOSIS — E1169 Type 2 diabetes mellitus with other specified complication: Secondary | ICD-10-CM

## 2020-07-31 DIAGNOSIS — M159 Polyosteoarthritis, unspecified: Secondary | ICD-10-CM

## 2020-07-31 MED ORDER — HYDROCODONE-ACETAMINOPHEN 5-325 MG PO TABS: 1.0000 | ORAL_TABLET | ORAL | 0 refills | Status: DC | PRN

## 2020-07-31 NOTE — Patient Instructions (Signed)
We will check the blood work today. ° ° °

## 2020-07-31 NOTE — Progress Notes (Signed)
   Subjective:   Patient ID: Mary [name], female    DOB: [birth year], 79 y.o.   MRN: [id number]  HPI The patient is a 79 YO female coming in for follow up diabetes (taking lantus and denies low sugars, checks sugars but not consistently) and diastolic heart failure (doing well with some weight loss and fluid restriction and salt restriction, denies swollen legs much now, some SOB chronic but stable) and cholesterol (taking lipitor 80 mg daily, denies side effects or missed doses, denies chest pains or stroke symptoms).   Review of Systems  Constitutional: Negative.   HENT: Negative.   Eyes: Negative.   Respiratory: Positive for shortness of breath. Negative for cough and chest tightness.   Cardiovascular: Negative for chest pain, palpitations and leg swelling.  Gastrointestinal: Negative for abdominal distention, abdominal pain, constipation, diarrhea, nausea and vomiting.  Musculoskeletal: Positive for arthralgias.  Skin: Negative.   Neurological: Negative.   Psychiatric/Behavioral: Negative.     Objective:  Physical Exam Constitutional:      Appearance: She is well-developed. She is obese.  HENT:     Head: Normocephalic and atraumatic.  Cardiovascular:     Rate and Rhythm: Normal rate and regular rhythm.  Pulmonary:     Effort: Pulmonary effort is normal. No respiratory distress.     Breath sounds: Normal breath sounds. No wheezing or rales.  Abdominal:     General: Bowel sounds are normal. There is no distension.     Palpations: Abdomen is soft.     Tenderness: There is no abdominal tenderness. There is no rebound.  Musculoskeletal:     Cervical back: Normal range of motion.  Skin:    General: Skin is warm and dry.  Neurological:     Mental Status: She is alert and oriented to person, place, and time.     Coordination: Coordination abnormal.     Comments: Slow gait     Vitals:   07/31/20 1548  BP: 132/74  Pulse: 73  Temp: 98.4 F (36.9 C)  TempSrc: Oral  SpO2:  97%  Weight: 217 lb (98.4 kg)  Height: 5\' 7"  (1.702 m)    This visit occurred during the SARS-CoV-2 public health emergency.  Safety protocols were in place, including screening questions prior to the visit, additional usage of staff PPE, and extensive cleaning of exam room while observing appropriate contact time as indicated for disinfecting solutions.   Assessment & Plan:

## 2020-08-01 LAB — COMPREHENSIVE METABOLIC PANEL
ALT: 7 U/L (ref 0–35)
AST: 12 U/L (ref 0–37)
Albumin: 3.8 g/dL (ref 3.5–5.2)
Alkaline Phosphatase: 123 U/L — ABNORMAL HIGH (ref 39–117)
BUN: 21 mg/dL (ref 6–23)
CO2: 32 mEq/L (ref 19–32)
Calcium: 9.4 mg/dL (ref 8.4–10.5)
Chloride: 95 mEq/L — ABNORMAL LOW (ref 96–112)
Creatinine, Ser: 1.4 mg/dL — ABNORMAL HIGH (ref 0.40–1.20)
GFR: 36 mL/min — ABNORMAL LOW (ref >60.00)
Glucose, Bld: 312 mg/dL — ABNORMAL HIGH (ref 70–99)
Potassium: 4.7 mEq/L (ref 3.5–5.1)
Sodium: 136 mEq/L (ref 135–145)
Total Bilirubin: 0.4 mg/dL (ref 0.2–1.2)
Total Protein: 7.3 g/dL (ref 6.0–8.3)

## 2020-08-01 LAB — CBC
HCT: 37.3 % (ref 36.0–46.0)
Hemoglobin: 12.3 g/dL (ref 12.0–15.0)
MCHC: 33 g/dL (ref 30.0–36.0)
MCV: 89.7 fl (ref 78.0–100.0)
Platelets: 353 10*3/uL (ref 150.0–400.0)
RBC: 4.16 Mil/uL (ref 3.87–5.11)
RDW: 12.9 % (ref 11.5–15.5)
WBC: 7.8 10*3/uL (ref 4.0–10.5)

## 2020-08-01 LAB — LIPID PANEL
Cholesterol: 208 mg/dL — ABNORMAL HIGH (ref 0–200)
HDL: 35.1 mg/dL — ABNORMAL LOW (ref >39.00)
NonHDL: 172.87
Total CHOL/HDL Ratio: 6
Triglycerides: 305 mg/dL — ABNORMAL HIGH (ref 0.0–149.0)
VLDL: 61 mg/dL — ABNORMAL HIGH (ref 0.0–40.0)

## 2020-08-01 LAB — LDL CHOLESTEROL, DIRECT: Direct LDL: 116 mg/dL

## 2020-08-01 LAB — TSH: TSH: 21.48 u[IU]/mL — ABNORMAL HIGH (ref 0.35–4.50)

## 2020-08-01 NOTE — Assessment & Plan Note (Signed)
Rx hydrocodone which she uses rarely. [place] narcotic database reviewed and no inappropriate fills.

## 2020-08-01 NOTE — Assessment & Plan Note (Signed)
Taking lipitor 80 mg daily and checking lipid panel and adjust as needed.

## 2020-08-01 NOTE — Assessment & Plan Note (Addendum)
Foot exam done, HgA1c with endo. Reminded about eye exam. On statin. Taking lantus. Checking CMP and adjust as needed.

## 2020-08-01 NOTE — Assessment & Plan Note (Signed)
No flare today, taking statin, torsemide and spironolactone and metoprolol.

## 2020-08-28 ENCOUNTER — Other Ambulatory Visit: Payer: Self-pay | Admitting: Internal Medicine

## 2020-08-28 ENCOUNTER — Encounter: Payer: Self-pay | Admitting: Internal Medicine

## 2020-08-28 DIAGNOSIS — Z1231 Encounter for screening mammogram for malignant neoplasm of breast: Secondary | ICD-10-CM

## 2020-09-12 ENCOUNTER — Ambulatory Visit: Payer: Medicare Other

## 2020-09-15 ENCOUNTER — Ambulatory Visit (INDEPENDENT_AMBULATORY_CARE_PROVIDER_SITE_OTHER): Payer: Medicare Other

## 2020-09-15 DIAGNOSIS — I441 Atrioventricular block, second degree: Secondary | ICD-10-CM

## 2020-09-16 LAB — CUP PACEART REMOTE DEVICE CHECK
Battery Remaining Longevity: 50 mo
Battery Remaining Percentage: 56 %
Battery Voltage: 2.89 V
Brady Statistic AP VP Percent: 90 %
Brady Statistic AP VS Percent: 1 %
Brady Statistic AS VP Percent: 9.7 %
Brady Statistic AS VS Percent: 1 %
Brady Statistic RA Percent Paced: 90 %
Brady Statistic RV Percent Paced: 99 %
Date Time Interrogation Session: 20220117040012
Implantable Lead Implant Date: 20150506
Implantable Lead Implant Date: 20150506
Implantable Lead Location: 753859
Implantable Lead Location: 753860
Implantable Pulse Generator Implant Date: 20150506
Lead Channel Impedance Value: 360 Ohm
Lead Channel Impedance Value: 430 Ohm
Lead Channel Pacing Threshold Amplitude: 0.75 V
Lead Channel Pacing Threshold Amplitude: 0.75 V
Lead Channel Pacing Threshold Pulse Width: 0.4 ms
Lead Channel Pacing Threshold Pulse Width: 0.4 ms
Lead Channel Sensing Intrinsic Amplitude: 0.5 mV
Lead Channel Sensing Intrinsic Amplitude: 6.3 mV
Lead Channel Setting Pacing Amplitude: 2.5 V
Lead Channel Setting Pacing Amplitude: 2.5 V
Lead Channel Setting Pacing Pulse Width: 0.4 ms
Lead Channel Setting Sensing Sensitivity: 4 mV
Pulse Gen Model: 2240
Pulse Gen Serial Number: 3013730

## 2020-09-29 NOTE — Patient Instructions (Signed)
Remote pacemaker transmission.   

## 2020-09-29 NOTE — Progress Notes (Signed)
Remote pacemaker transmission.   

## 2020-10-06 ENCOUNTER — Ambulatory Visit (HOSPITAL_BASED_OUTPATIENT_CLINIC_OR_DEPARTMENT_OTHER): Payer: Medicare Other

## 2020-10-13 ENCOUNTER — Other Ambulatory Visit: Payer: Self-pay

## 2020-10-13 ENCOUNTER — Ambulatory Visit (HOSPITAL_BASED_OUTPATIENT_CLINIC_OR_DEPARTMENT_OTHER)
Admission: RE | Admit: 2020-10-13 | Discharge: 2020-10-13 | Disposition: A | Discharge status: Home / Self Care | Payer: Medicare Other | Source: Ambulatory Visit | Attending: Internal Medicine | Admitting: Internal Medicine

## 2020-10-13 DIAGNOSIS — Z1231 Encounter for screening mammogram for malignant neoplasm of breast: Secondary | ICD-10-CM

## 2020-11-01 ENCOUNTER — Other Ambulatory Visit: Payer: Self-pay | Admitting: Internal Medicine

## 2020-11-03 NOTE — Telephone Encounter (Signed)
Eliquis 5mg  refill request received. Patient is 80 years old, weight-98.4kg, Crea-1.40 on 07/31/2020, Diagnosis-Afib, and last seen by Dr. [name] on 05/29/2020. Dose is appropriate based on dosing criteria. Will send in refill to requested pharmacy.

## 2020-11-24 ENCOUNTER — Other Ambulatory Visit: Payer: Self-pay | Admitting: Interventional Cardiology

## 2020-11-24 ENCOUNTER — Telehealth: Payer: Self-pay | Admitting: Internal Medicine

## 2020-11-24 DIAGNOSIS — M159 Polyosteoarthritis, unspecified: Secondary | ICD-10-CM

## 2020-11-24 DIAGNOSIS — M8949 Other hypertrophic osteoarthropathy, multiple sites: Secondary | ICD-10-CM

## 2020-11-24 MED ORDER — HYDROCODONE-ACETAMINOPHEN 5-325 MG PO TABS: 1.0000 | ORAL_TABLET | ORAL | 0 refills | Status: DC | PRN

## 2020-11-24 NOTE — Telephone Encounter (Signed)
See below

## 2020-11-24 NOTE — Telephone Encounter (Signed)
Patient calling to request refill for HYDROcodone-acetaminophen (NORCO/VICODIN) 5-325 MG tablet  Ph[facility]acy [id number] - [place], [place] - [place], [address] AT [facility] [address]

## 2020-12-10 ENCOUNTER — Ambulatory Visit: Payer: Medicare Other | Admitting: Interventional Cardiology

## 2020-12-12 ENCOUNTER — Emergency Department (HOSPITAL_BASED_OUTPATIENT_CLINIC_OR_DEPARTMENT_OTHER): Payer: Medicare Other

## 2020-12-12 ENCOUNTER — Other Ambulatory Visit: Payer: Self-pay

## 2020-12-12 ENCOUNTER — Encounter (HOSPITAL_BASED_OUTPATIENT_CLINIC_OR_DEPARTMENT_OTHER): Payer: Self-pay | Admitting: Emergency Medicine

## 2020-12-12 ENCOUNTER — Emergency Department (HOSPITAL_BASED_OUTPATIENT_CLINIC_OR_DEPARTMENT_OTHER)
Admission: EM | Admit: 2020-12-12 | Discharge: 2020-12-13 | Disposition: A | Discharge status: Home / Self Care | Payer: Medicare Other | Attending: Emergency Medicine | Admitting: Emergency Medicine

## 2020-12-12 DIAGNOSIS — Z79899 Other long term (current) drug therapy: Secondary | ICD-10-CM | POA: Diagnosis not present

## 2020-12-12 DIAGNOSIS — Z955 Presence of coronary angioplasty implant and graft: Secondary | ICD-10-CM | POA: Insufficient documentation

## 2020-12-12 DIAGNOSIS — Z87891 Personal history of nicotine dependence: Secondary | ICD-10-CM | POA: Diagnosis not present

## 2020-12-12 DIAGNOSIS — Z7901 Long term (current) use of anticoagulants: Secondary | ICD-10-CM | POA: Diagnosis not present

## 2020-12-12 DIAGNOSIS — I110 Hypertensive heart disease with heart failure: Secondary | ICD-10-CM | POA: Insufficient documentation

## 2020-12-12 DIAGNOSIS — S9032XA Contusion of left foot, initial encounter: Secondary | ICD-10-CM | POA: Insufficient documentation

## 2020-12-12 DIAGNOSIS — E1140 Type 2 diabetes mellitus with diabetic neuropathy, unspecified: Secondary | ICD-10-CM | POA: Diagnosis not present

## 2020-12-12 DIAGNOSIS — I5043 Acute on chronic combined systolic (congestive) and diastolic (congestive) heart failure: Secondary | ICD-10-CM | POA: Diagnosis not present

## 2020-12-12 DIAGNOSIS — Z91040 Latex allergy status: Secondary | ICD-10-CM | POA: Diagnosis not present

## 2020-12-12 DIAGNOSIS — W101XXA Fall (on)(from) sidewalk curb, initial encounter: Secondary | ICD-10-CM | POA: Diagnosis not present

## 2020-12-12 DIAGNOSIS — S8991XA Unspecified injury of right lower leg, initial encounter: Secondary | ICD-10-CM | POA: Diagnosis not present

## 2020-12-12 DIAGNOSIS — J449 Chronic obstructive pulmonary disease, unspecified: Secondary | ICD-10-CM | POA: Insufficient documentation

## 2020-12-12 DIAGNOSIS — E039 Hypothyroidism, unspecified: Secondary | ICD-10-CM | POA: Diagnosis not present

## 2020-12-12 DIAGNOSIS — I2510 Atherosclerotic heart disease of native coronary artery without angina pectoris: Secondary | ICD-10-CM | POA: Diagnosis not present

## 2020-12-12 DIAGNOSIS — I4891 Unspecified atrial fibrillation: Secondary | ICD-10-CM | POA: Diagnosis not present

## 2020-12-12 DIAGNOSIS — Z85038 Personal history of other malignant neoplasm of large intestine: Secondary | ICD-10-CM | POA: Diagnosis not present

## 2020-12-12 DIAGNOSIS — Z794 Long term (current) use of insulin: Secondary | ICD-10-CM | POA: Diagnosis not present

## 2020-12-12 DIAGNOSIS — S99922A Unspecified injury of left foot, initial encounter: Secondary | ICD-10-CM | POA: Diagnosis present

## 2020-12-12 NOTE — ED Triage Notes (Signed)
Pt states fell over curb yesterday and noticed today swelling and brusing to left foot. Pt is diabetic and had meds during cancer treatment that has caused neuropathy.

## 2020-12-12 NOTE — ED Provider Notes (Signed)
[facility] Provider Note   CSN: [id number] Arrival date & time: 12/12/20  2315     History Chief Complaint  Patient presents with  . Fall  . Foot Injury    Mary [name] is a 80 y.o. female.  Patient presents to the emergency department for evaluation of bruising to her left foot.  Patient reports that she tripped over a curb yesterday and fell, landing on her right knee.  She did not fall away to the ground or hit her head.  Patient did not notice any pain other than some mild pain in the right knee until tonight.  Tonight she noticed that she has swelling and bruising of her toes on her left foot.  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  No open wounds.        Past Medical History:  Diagnosis Date  . Arthritis   . Atrial tachycardia ([place]) 04/23/2017  . CAD (coronary artery disease)    non obstructive  . 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 Procedure complicated by an atrial lead dislodgment and repeat procedure, 12/2013   . Carotid stenosis    Carotid US 5/16:  Bilateral ICA 1-39%; L vertebral retrograde; L BP 126/49, R BP 140/57  . Chronic combined systolic and diastolic CHF (congestive heart failure) ([place])   . Chronic lower back pain   . Colon cancer ([place])    a. s/p chemo  . Colon polyps   . Diabetic peripheral neuropathy ([place])   . DVT (deep venous thrombosis) ([place]) 2013   "twice behind knee on left side" (06/25/2013)  . Glaucoma   . History of chicken pox   . Hyperlipidemia   . Hypertension   . Hypothyroidism   . Iron deficiency anemia   . Multinodular goiter   . Osteoporosis   . PAF (paroxysmal atrial fibrillation) ([place])   . Sleep apnea    a. resolved post weight loss   . Type II diabetes mellitus [facility])     Patient Active Problem List   Diagnosis Date Noted  . Right wrist pain 10/05/2019  .  OSA (obstructive sleep apnea) 06/07/2018  . COPD (chronic obstructive pulmonary disease) ([place]) 05/24/2018  . Acute on chronic combined systolic and diastolic CHF (congestive heart failure) ([place]) 05/24/2018  . Acute respiratory failure with hypoxia ([place]) 05/21/2018  . Osteoarthritis 05/25/2017  . Atrial tachycardia ([place]) 04/23/2017  . Second degree AV block, Mobitz type II   . Depression 08/02/2016  . Chronic diastolic heart failure ([place]) 08/02/2016  . Tobacco abuse 08/02/2016  . Right lumbar radiculitis 06/09/2016  . Tear of medial meniscus of right knee, current 04/22/2016  . Chronic venous insufficiency 02/21/2015  . 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 Atrioventricular block, complete ([place]) 01/02/2014  . Chronic anticoagulation 10/09/2013  . PAF (paroxysmal atrial fibrillation) ([place]) 08/29/2013  . Obesity 05/05/2013  . CAD in native artery   . Diabetes mellitus with complication ([place])   . Hypothyroidism   . Hypertension   . Colon cancer ([place])   . Hyperlipidemia associated with type 2 diabetes mellitus [facility])     Past Surgical History:  Procedure Laterality Date  . CARDIAC CATHETERIZATION  2006   Never had PCI, 3 caths total. Last one in [place]  . CARDIAC CATHETERIZATION N/A 02/04/2015   Procedure: Left  Heart Cath and Coronary Angiography;  Surgeon: Belva Crome, MD;  Location: [facility] CV LAB;  Service: Cardiovascular;  Laterality: N/A;  . CATARACT EXTRACTION, BILATERAL Bilateral 2013   "and put stent in my left eye for glaucoma" (06/25/2013)  . CHOLECYSTECTOMY  1980's  . COLECTOMY  10/2010   Tumor removal  . EYE MUSCLE SURGERY Bilateral ~ 1963   "muscles too long; eyes would go out and up; tied muscles to hold my eyes straight" (06/25/2013)  . LEAD REVISION  01-03-2014   atrial lead revision by Dr Rayann Heman  . LEAD REVISION N/A 01/03/2014   Procedure: LEAD REVISION;  Surgeon: Coralyn Mark, MD;  Location: [facility] CATH LAB;   Service: Cardiovascular;  Laterality: N/A;  . LEFT HEART CATH AND CORONARY ANGIOGRAPHY N/A 05/29/2018   Procedure: LEFT HEART CATH AND CORONARY ANGIOGRAPHY;  Surgeon: Belva Crome, MD;  Location: [facility] CV LAB;  Service: Cardiovascular;  Laterality: N/A;  . PACEMAKER INSERTION  01/02/2014   STJ Assurity dual chamber pacemaker implnated by Dr Lovena Le for SSS  . PERMANENT PACEMAKER INSERTION N/A 01/02/2014   Procedure: PERMANENT PACEMAKER INSERTION;  Surgeon: Evans Lance, MD;  Location: [facility] CATH LAB;  Service: Cardiovascular;  Laterality: N/A;  . PORTACATH PLACEMENT Left 11/2010  . ROUX-EN-Y GASTRIC BYPASS  2009  . ULNAR TUNNEL RELEASE Left ~ 2008  . UMBILICAL HERNIA REPAIR  1970's?    (06/25/2013)     OB History   No obstetric history on file.     Family History  Problem Relation Age of Onset  . Breast cancer Mother   . Diabetes Mother   . Heart disease Father   . Heart attack Father   . Heart attack Sister   . Heart attack Brother   . Bladder Cancer Brother   . Prostate cancer Brother   . Colon polyps Brother   . Breast cancer Sister   . Breast cancer Maternal Aunt        x 2 aunts  . Pancreatic cancer Brother   . Colon polyps Brother   . Bladder Cancer Cousin   . Clotting disorder Sister   . Diabetes Sister        x 3  . Diabetes Maternal Grandmother     Social History   Tobacco Use  . Smoking status: Former Smoker    Packs/day: 1.00    Years: 30.00    Pack years: 30.00    Types: Cigarettes    Quit date: 02/2017    Years since quitting: 3.7  . Smokeless tobacco: Never Used  . Tobacco comment: started in her [age] until 2009   Vaping Use  . Vaping Use: Never used  Substance Use Topics  . Alcohol use: Not Currently    Alcohol/week: 0.0 standard drinks  . Drug use: No    Home Medications Prior to Admission medications   Medication Sig Start Date End Date Taking? Authorizing Provider  albuterol (PROVENTIL HFA;VENTOLIN HFA) 108 (90 Base) MCG/ACT  inhaler Inhale 2 puffs into the lungs every 6 (six) hours as needed for wheezing or shortness of breath. 05/30/18   Regalado, Belkys A, MD  atorvastatin (LIPITOR) 80 MG tablet TAKE 1 TABLET(80 MG) BY MOUTH DAILY 09/17/19   Hoyt Koch, MD  betamethasone dipropionate (DIPROLENE) 0.05 % ointment Apply topically 2 (two) times daily. 05/19/20   Gareth Morgan, MD  bimatoprost (LUMIGAN) 0.01 % SOLN Place 1 drop into the right eye at bedtime.     [provider]  buPROPion (WELLBUTRIN XL) 150 MG 24 hr tablet TAKE 1 TABLET(150 MG) BY MOUTH DAILY 06/19/20   Hoyt Koch, MD  ELIQUIS 5 MG TABS tablet TAKE 1 TABLET BY MOUTH TWICE A DAY 11/03/20   Belva Crome, MD  HYDROcodone-acetaminophen (NORCO/VICODIN) 5-325 MG tablet Take 1 tablet by mouth every 4 (four) hours as needed for moderate pain. 11/24/20   Hoyt Koch, MD  insulin glargine (LANTUS) 100 UNIT/ML injection Inject into the skin daily. Sliding Scale    [provider]  levothyroxine (SYNTHROID, LEVOTHROID) 125 MCG tablet Take 125 mcg by mouth daily before breakfast. 01/10/18   [provider]  metoprolol succinate (TOPROL-XL) 25 MG 24 hr tablet TAKE 1/2 TABLETS AT BEDTIME 08/13/19   Belva Crome, MD  nitroGLYCERIN (NITROSTAT) 0.4 MG SL tablet Place 1 tablet (0.4 mg total) under the tongue every 5 (five) minutes as needed for chest pain. 04/09/19   Belva Crome, MD  Potassium Chloride ER 20 MEQ TBCR TAKE 2 TABLETS BY MOUTH TWICE A DAY 11/25/20   Belva Crome, MD  potassium chloride SA (K-DUR) 20 MEQ tablet Take 2 tablets (40 mEq total) by mouth 2 (two) times daily. 02/28/19   Richardson Dopp T, PA-C  spironolactone (ALDACTONE) 25 MG tablet Take 0.5 tablets (12.5 mg total) by mouth daily. 01/07/20   Belva Crome, MD  torsemide (DEMADEX) 20 MG tablet Take 20 mg by mouth 3 (three) times daily. 2 in the AM; 1 in the PM    [provider]    Allergies    Other, Adhesive [tape], Aspirin, Latex,  and Nsaids  Review of Systems   Review of Systems  Musculoskeletal: Positive for arthralgias.  Skin: Positive for color change.  All other systems reviewed and are negative.   Physical Exam Updated Vital Signs BP 115/85   Pulse 72   Temp 98.3 F (36.8 C) (Oral)   Resp 18   Ht 5\' 7"  (1.702 m)   Wt 94.8 kg   SpO2 99%   BMI 32.73 kg/m   Physical Exam Vitals and nursing note reviewed.  Constitutional:      General: She is not in acute distress.    Appearance: Normal appearance. She is well-developed.  HENT:     Head: Normocephalic and atraumatic.     Right Ear: Hearing normal.     Left Ear: Hearing normal.     Nose: Nose normal.  Eyes:     Conjunctiva/sclera: Conjunctivae normal.     Pupils: Pupils are equal, round, and reactive to light.  Cardiovascular:     Rate and Rhythm: Regular rhythm.     Heart sounds: S1 normal and S2 normal. No murmur heard. No friction rub. No gallop.   Pulmonary:     Effort: Pulmonary effort is normal. No respiratory distress.     Breath sounds: Normal breath sounds.  Chest:     Chest wall: No tenderness.  Abdominal:     General: Bowel sounds are normal.     Palpations: Abdomen is soft.     Tenderness: There is no abdominal tenderness. There is no guarding or rebound. Negative signs include Murphy's sign and McBurney's sign.     Hernia: No hernia is present.  Musculoskeletal:        General: Swelling (Toes on left foot) present. Normal range of motion.     Cervical back: Normal range of motion and neck supple.     Right knee: No swelling, deformity,  effusion, erythema, ecchymosis or lacerations. Normal range of motion. Tenderness present.     Right foot: Swelling present. No deformity.  Skin:    General: Skin is warm and dry.     Findings: Bruising (Toes on left foot) present. No rash.  Neurological:     Mental Status: She is alert and oriented to person, place, and time.     GCS: GCS eye subscore is 4. GCS verbal subscore is 5. GCS  motor subscore is 6.     Cranial Nerves: No cranial nerve deficit.     Sensory: No sensory deficit.     Coordination: Coordination normal.  Psychiatric:        Speech: Speech normal.        Behavior: Behavior normal.        Thought Content: Thought content normal.     ED Results / Procedures / Treatments   Labs (all labs ordered are listed, but only abnormal results are displayed) Labs Reviewed - No data to display  EKG None  Radiology DG Tibia/Fibula Left  Result Date: 12/13/2020 CLINICAL DATA:  Recent fall with left lower leg pain, initial encounter EXAM: LEFT TIBIA AND FIBULA - 2 VIEW COMPARISON:  None. FINDINGS: Degenerative changes about the knee joint are seen. Diffuse soft tissue calcifications are noted. No acute fracture or dislocation is seen. No acute soft tissue abnormality is noted. IMPRESSION: Degenerative change without acute bony abnormality. Electronically Signed   By: Inez Catalina M.D.   On: 12/13/2020 00:31   DG Knee Complete 4 Views Right  Result Date: 12/13/2020 CLINICAL DATA:  Recent fall with right knee pain, initial encounter EXAM: RIGHT KNEE - COMPLETE 4+ VIEW COMPARISON:  None. FINDINGS: Tricompartmental degenerative changes are noted. No acute fracture or dislocation is noted. No joint effusion is seen. IMPRESSION: Degenerative change without acute abnormality. Electronically Signed   By: Inez Catalina M.D.   On: 12/13/2020 00:28   DG Foot Complete Left  Result Date: 12/13/2020 CLINICAL DATA:  Recent fall with pain and swelling in the left foot, initial encounter EXAM: LEFT FOOT - COMPLETE 3+ VIEW COMPARISON:  None. FINDINGS: Mild osteopenia is noted. Mild degenerative changes of the tarsal bones and calcaneal spurring is seen. Degenerative changes at the first MTP joint are noted. No definitive acute fracture is seen. IMPRESSION: Chronic changes without acute abnormality. Electronically Signed   By: Inez Catalina M.D.   On: 12/13/2020 00:30     Procedures Procedures   Medications Ordered in ED Medications - No data to display  ED Course  I have reviewed the triage vital signs and the nursing notes.  Pertinent labs & imaging results that were available during my care of the patient were reviewed by me and considered in my medical decision making (see chart for details).    MDM Rules/Calculators/A&P                          Patient presents to the emergency department for evaluation of right knee and left foot injury.  Patient tripped over a curb yesterday but did not noticed injury.  She reports that she has neuropathy in both of her feet and is not experiencing any pain.  She noticed the bruising tonight so she came in for evaluation.  She does have bruising and slight swelling of the toes and distal portion of the foot.  X-ray, however, is negative.  Symptoms likely secondary to her Eliquis use.  No skin  breakdown.  She was counseled that she needs to be very careful with the swelling that it does not rub on anything and cause a wound.  We will fit with a cam boot.  Remainder of examination unremarkable.  She did not hit her head.  No concern for intracranial injury.  No spinal complaints.  Final Clinical Impression(s) / ED Diagnoses Final diagnoses:  Contusion of left foot, initial encounter    Rx / DC Orders ED Discharge Orders    None       Gizzelle Lacomb, Gwenyth Allegra, MD 12/13/20 [phone]

## 2020-12-21 NOTE — Progress Notes (Incomplete)
Cardiology Office Note:    Date:  12/21/2020   ID:  Mary [name], DOB 06-12-1941, MRN [id number]  PCP:  Hoyt Koch, MD  Cardiologist:  Sinclair Grooms, MD   Referring MD: Hoyt Koch, *   No chief complaint on file.   History of Present Illness:    Mary [name] is a 80 y.o. 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 Jude), PAF on Eliquis, DVT ( on Eliquis), HTN, HLD, OSA,andnon-obstructiveCADby cath (01/2015).  ***  Past Medical History:  Diagnosis Date  . Arthritis   . Atrial tachycardia ([place]) 04/23/2017  . CAD (coronary artery disease)    non obstructive  . 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 Procedure complicated by an atrial lead dislodgment and repeat procedure, 12/2013   . Carotid stenosis    Carotid US 5/16:  Bilateral ICA 1-39%; L vertebral retrograde; L BP 126/49, R BP 140/57  . Chronic combined systolic and diastolic CHF (congestive heart failure) ([place])   . Chronic lower back pain   . Colon cancer ([place])    a. s/p chemo  . Colon polyps   . Diabetic peripheral neuropathy ([place])   . DVT (deep venous thrombosis) ([place]) 2013   "twice behind knee on left side" (06/25/2013)  . Glaucoma   . History of chicken pox   . Hyperlipidemia   . Hypertension   . Hypothyroidism   . Iron deficiency anemia   . Multinodular goiter   . Osteoporosis   . PAF (paroxysmal atrial fibrillation) ([place])   . Sleep apnea    a. resolved post weight loss   . Type II diabetes mellitus ([place])     Past Surgical History:  Procedure Laterality Date  . CARDIAC CATHETERIZATION  2006   Never had PCI, 3 caths total. Last one in [place]  . CARDIAC CATHETERIZATION N/A 02/04/2015   Procedure: Left Heart Cath and Coronary Angiography;  Surgeon: Belva Crome, MD;  Location: [facility] CV LAB;  Service: Cardiovascular;  Laterality: N/A;   . CATARACT EXTRACTION, BILATERAL Bilateral 2013   "and put stent in my left eye for glaucoma" (06/25/2013)  . CHOLECYSTECTOMY  1980's  . COLECTOMY  10/2010   Tumor removal  . EYE MUSCLE SURGERY Bilateral ~ 1963   "muscles too long; eyes would go out and up; tied muscles to hold my eyes straight" (06/25/2013)  . LEAD REVISION  01-03-2014   atrial lead revision by Dr Rayann Heman  . LEAD REVISION N/A 01/03/2014   Procedure: LEAD REVISION;  Surgeon: Coralyn Mark, MD;  Location: [facility] CATH LAB;  Service: Cardiovascular;  Laterality: N/A;  . LEFT HEART CATH AND CORONARY ANGIOGRAPHY N/A 05/29/2018   Procedure: LEFT HEART CATH AND CORONARY ANGIOGRAPHY;  Surgeon: Belva Crome, MD;  Location: [facility] CV LAB;  Service: Cardiovascular;  Laterality: N/A;  . PACEMAKER INSERTION  01/02/2014   STJ Assurity dual chamber pacemaker implnated by Dr Lovena Le for SSS  . PERMANENT PACEMAKER INSERTION N/A 01/02/2014   Procedure: PERMANENT PACEMAKER INSERTION;  Surgeon: Evans Lance, MD;  Location: [facility] CATH LAB;  Service: Cardiovascular;  Laterality: N/A;  . PORTACATH PLACEMENT Left 11/2010  . ROUX-EN-Y GASTRIC BYPASS  2009  . ULNAR TUNNEL RELEASE Left ~ 2008  . UMBILICAL HERNIA REPAIR  1970's?    (06/25/2013)    Current Medications: No outpatient medications have been  marked as taking for the 12/22/20 encounter (Appointment) with Belva Crome, MD.     Allergies:   Other, Adhesive [tape], Aspirin, Latex, and Nsaids   Social History   Socioeconomic History  . Marital status: Married    Spouse name: Mary [name]  . Number of children: 0  . Years of education: Not on file  . Highest education level: Not on file  Occupational History  . Occupation: Retired    Comment: office work  Tobacco Use  . Smoking status: Former Smoker    Packs/day: 1.00    Years: 30.00    Pack years: 30.00    Types: Cigarettes    Quit date: 02/2017    Years since quitting: 3.8  . Smokeless tobacco: Never Used  . Tobacco comment:  started in her [age] until 2009   Vaping Use  . Vaping Use: Never used  Substance and Sexual Activity  . Alcohol use: Not Currently    Alcohol/week: 0.0 standard drinks  . Drug use: No  . Sexual activity: Yes  Other Topics Concern  . Not on file  Social History Narrative   Married to husband, Mary [name]   No children   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   Recently moved back here from [place] Strain: Not on [employer] Insecurity: Not on file  Transportation Needs: Not on file  Physical Activity: Not on file  Stress: Not on file  Social Connections: Not on file     Family History: The patient's family history includes Bladder Cancer in her brother and cousin; Breast cancer in her maternal aunt, mother, and sister; Clotting disorder in her sister; Colon polyps in her brother and brother; Diabetes in her maternal grandmother, mother, and sister; Heart attack in her brother, father, and sister; Heart disease in her father; Pancreatic cancer in her brother; Prostate cancer in her brother.  ROS:   Please see the history of present illness.    *** All other systems reviewed and are negative.  EKGs/Labs/Other Studies Reviewed:    The following studies were reviewed today: ***  EKG:  EKG ***  Recent Labs: 05/29/2020: NT-Pro BNP 1,027 07/31/2020: ALT 7; BUN 21; Creatinine, Ser 1.40; Hemoglobin 12.3; Platelets 353.0; Potassium 4.7; Sodium 136; TSH 21.48  Recent Lipid Panel    Component Value Date/Time   CHOL 208 (H) 07/31/2020 1625   TRIG 305.0 (H) 07/31/2020 1625   HDL 35.10 (L) 07/31/2020 1625   CHOLHDL 6 07/31/2020 1625   VLDL 61.0 (H) 07/31/2020 1625   LDLCALC 73 05/25/2018 0531   LDLDIRECT 116.0 07/31/2020 1625    Physical Exam:    VS:  There were no vitals taken for this visit.    Wt Readings from Last 3 Encounters:  12/12/20 209 lb (94.8 kg)  07/31/20 217 lb (98.4 kg)  05/29/20 215 lb  (97.5 kg)     GEN: ***. No acute distress HEENT: Normal NECK: No JVD. LYMPHATICS: No lymphadenopathy CARDIAC: *** murmur. RRR *** gallop, or edema. VASCULAR: *** Normal Pulses. No bruits. RESPIRATORY:  Clear to auscultation without rales, wheezing or rhonchi  ABDOMEN: Soft, non-tender, non-distended, No pulsatile mass, MUSCULOSKELETAL: No deformity  SKIN: Warm and dry NEUROLOGIC:  Alert and oriented x 3 PSYCHIATRIC:  Normal affect   ASSESSMENT:    1. Coronary artery disease of native artery of native heart with stable angina pectoris ([place])   2. Essential hypertension  3. PAF (paroxysmal atrial fibrillation) ([place])   4. Chronic anticoagulation   5. Chronic diastolic heart failure ([place])   6. Chronic obstructive pulmonary disease, unspecified COPD type ([place])   7. Tobacco abuse    PLAN:    In order of problems listed above:  1. ***   Medication Adjustments/Labs and Tests Ordered: Current medicines are reviewed at length with the patient today.  Concerns regarding medicines are outlined above.  No orders of the defined types were placed in this encounter.  No orders of the defined types were placed in this encounter.   There are no Patient Instructions on file for this visit.   Signed, Sinclair Grooms, MD  12/21/2020 5:16 PM    [place]

## 2020-12-22 ENCOUNTER — Ambulatory Visit: Payer: Medicare Other | Admitting: Interventional Cardiology

## 2020-12-22 DIAGNOSIS — Z720 Tobacco use: Secondary | ICD-10-CM

## 2020-12-22 DIAGNOSIS — I10 Essential (primary) hypertension: Secondary | ICD-10-CM

## 2020-12-22 DIAGNOSIS — Z7901 Long term (current) use of anticoagulants: Secondary | ICD-10-CM

## 2020-12-22 DIAGNOSIS — I5032 Chronic diastolic (congestive) heart failure: Secondary | ICD-10-CM

## 2020-12-22 DIAGNOSIS — I480 Paroxysmal atrial fibrillation: Secondary | ICD-10-CM

## 2020-12-22 DIAGNOSIS — J449 Chronic obstructive pulmonary disease, unspecified: Secondary | ICD-10-CM

## 2020-12-22 DIAGNOSIS — I25118 Atherosclerotic heart disease of native coronary artery with other forms of angina pectoris: Secondary | ICD-10-CM

## 2020-12-23 ENCOUNTER — Encounter: Payer: Self-pay | Admitting: Internal Medicine

## 2021-01-06 ENCOUNTER — Other Ambulatory Visit: Payer: Self-pay

## 2021-01-06 MED ORDER — METOPROLOL SUCCINATE ER 25 MG PO TB24: ORAL_TABLET | ORAL | 1 refills | Status: DC

## 2021-01-06 MED ORDER — SPIRONOLACTONE 25 MG PO TABS: 12.5000 mg | ORAL_TABLET | Freq: Every day | ORAL | 1 refills | Status: DC

## 2021-01-06 NOTE — Telephone Encounter (Signed)
Pt's medication was sent to pt's pharmacy as requested. Confirmation received.  °

## 2021-01-22 ENCOUNTER — Encounter: Payer: Self-pay | Admitting: Internal Medicine

## 2021-01-22 DIAGNOSIS — M8949 Other hypertrophic osteoarthropathy, multiple sites: Secondary | ICD-10-CM

## 2021-01-22 DIAGNOSIS — M159 Polyosteoarthritis, unspecified: Secondary | ICD-10-CM

## 2021-01-27 MED ORDER — HYDROCODONE-ACETAMINOPHEN 5-325 MG PO TABS: 1.0000 | ORAL_TABLET | ORAL | 0 refills | Status: DC | PRN

## 2021-02-03 ENCOUNTER — Other Ambulatory Visit: Payer: Self-pay

## 2021-02-03 ENCOUNTER — Inpatient Hospital Stay (HOSPITAL_BASED_OUTPATIENT_CLINIC_OR_DEPARTMENT_OTHER)
Admission: EM | Admit: 2021-02-03 | Discharge: 2021-02-07 | DRG: 638 | Disposition: A | Discharge status: Home / Self Care | Payer: Medicare Other | Attending: Family Medicine | Admitting: Family Medicine

## 2021-02-03 ENCOUNTER — Telehealth: Payer: Self-pay | Admitting: Internal Medicine

## 2021-02-03 ENCOUNTER — Encounter (HOSPITAL_BASED_OUTPATIENT_CLINIC_OR_DEPARTMENT_OTHER): Payer: Self-pay

## 2021-02-03 ENCOUNTER — Emergency Department (HOSPITAL_BASED_OUTPATIENT_CLINIC_OR_DEPARTMENT_OTHER): Payer: Medicare Other

## 2021-02-03 DIAGNOSIS — Z86718 Personal history of other venous thrombosis and embolism: Secondary | ICD-10-CM

## 2021-02-03 DIAGNOSIS — L97529 Non-pressure chronic ulcer of other part of left foot with unspecified severity: Secondary | ICD-10-CM | POA: Diagnosis present

## 2021-02-03 DIAGNOSIS — E1122 Type 2 diabetes mellitus with diabetic chronic kidney disease: Secondary | ICD-10-CM | POA: Diagnosis present

## 2021-02-03 DIAGNOSIS — I480 Paroxysmal atrial fibrillation: Secondary | ICD-10-CM | POA: Diagnosis present

## 2021-02-03 DIAGNOSIS — Z833 Family history of diabetes mellitus: Secondary | ICD-10-CM

## 2021-02-03 DIAGNOSIS — L03116 Cellulitis of left lower limb: Secondary | ICD-10-CM | POA: Diagnosis present

## 2021-02-03 DIAGNOSIS — Z8042 Family history of malignant neoplasm of prostate: Secondary | ICD-10-CM

## 2021-02-03 DIAGNOSIS — R739 Hyperglycemia, unspecified: Secondary | ICD-10-CM

## 2021-02-03 DIAGNOSIS — Z8052 Family history of malignant neoplasm of bladder: Secondary | ICD-10-CM

## 2021-02-03 DIAGNOSIS — E11628 Type 2 diabetes mellitus with other skin complications: Principal | ICD-10-CM | POA: Diagnosis present

## 2021-02-03 DIAGNOSIS — I959 Hypotension, unspecified: Secondary | ICD-10-CM | POA: Diagnosis not present

## 2021-02-03 DIAGNOSIS — E11621 Type 2 diabetes mellitus with foot ulcer: Secondary | ICD-10-CM | POA: Diagnosis present

## 2021-02-03 DIAGNOSIS — Z803 Family history of malignant neoplasm of breast: Secondary | ICD-10-CM

## 2021-02-03 DIAGNOSIS — E785 Hyperlipidemia, unspecified: Secondary | ICD-10-CM | POA: Diagnosis present

## 2021-02-03 DIAGNOSIS — Z832 Family history of diseases of the blood and blood-forming organs and certain disorders involving the immune mechanism: Secondary | ICD-10-CM

## 2021-02-03 DIAGNOSIS — T451X5A Adverse effect of antineoplastic and immunosuppressive drugs, initial encounter: Secondary | ICD-10-CM | POA: Diagnosis present

## 2021-02-03 DIAGNOSIS — M1990 Unspecified osteoarthritis, unspecified site: Secondary | ICD-10-CM | POA: Diagnosis present

## 2021-02-03 DIAGNOSIS — Z87891 Personal history of nicotine dependence: Secondary | ICD-10-CM

## 2021-02-03 DIAGNOSIS — D509 Iron deficiency anemia, unspecified: Secondary | ICD-10-CM | POA: Diagnosis present

## 2021-02-03 DIAGNOSIS — G620 Drug-induced polyneuropathy: Secondary | ICD-10-CM | POA: Diagnosis present

## 2021-02-03 DIAGNOSIS — I5042 Chronic combined systolic (congestive) and diastolic (congestive) heart failure: Secondary | ICD-10-CM | POA: Diagnosis present

## 2021-02-03 DIAGNOSIS — I130 Hypertensive heart and chronic kidney disease with heart failure and stage 1 through stage 4 chronic kidney disease, or unspecified chronic kidney disease: Secondary | ICD-10-CM | POA: Diagnosis present

## 2021-02-03 DIAGNOSIS — I2510 Atherosclerotic heart disease of native coronary artery without angina pectoris: Secondary | ICD-10-CM | POA: Diagnosis present

## 2021-02-03 DIAGNOSIS — M869 Osteomyelitis, unspecified: Secondary | ICD-10-CM | POA: Diagnosis present

## 2021-02-03 DIAGNOSIS — Z800 Family history of malignant neoplasm of digestive organs: Secondary | ICD-10-CM

## 2021-02-03 DIAGNOSIS — Z20822 Contact with and (suspected) exposure to covid-19: Secondary | ICD-10-CM | POA: Diagnosis present

## 2021-02-03 DIAGNOSIS — N1831 Chronic kidney disease, stage 3a: Secondary | ICD-10-CM | POA: Diagnosis present

## 2021-02-03 DIAGNOSIS — I441 Atrioventricular block, second degree: Secondary | ICD-10-CM | POA: Diagnosis present

## 2021-02-03 DIAGNOSIS — Z9221 Personal history of antineoplastic chemotherapy: Secondary | ICD-10-CM

## 2021-02-03 DIAGNOSIS — E039 Hypothyroidism, unspecified: Secondary | ICD-10-CM | POA: Diagnosis present

## 2021-02-03 DIAGNOSIS — Z8719 Personal history of other diseases of the digestive system: Secondary | ICD-10-CM

## 2021-02-03 DIAGNOSIS — E876 Hypokalemia: Secondary | ICD-10-CM | POA: Diagnosis not present

## 2021-02-03 DIAGNOSIS — Z7901 Long term (current) use of anticoagulants: Secondary | ICD-10-CM

## 2021-02-03 DIAGNOSIS — M545 Low back pain, unspecified: Secondary | ICD-10-CM | POA: Diagnosis present

## 2021-02-03 DIAGNOSIS — E1169 Type 2 diabetes mellitus with other specified complication: Secondary | ICD-10-CM | POA: Diagnosis present

## 2021-02-03 DIAGNOSIS — E1142 Type 2 diabetes mellitus with diabetic polyneuropathy: Secondary | ICD-10-CM | POA: Diagnosis present

## 2021-02-03 DIAGNOSIS — M810 Age-related osteoporosis without current pathological fracture: Secondary | ICD-10-CM | POA: Diagnosis present

## 2021-02-03 DIAGNOSIS — Z950 Presence of cardiac pacemaker: Secondary | ICD-10-CM

## 2021-02-03 DIAGNOSIS — Z8249 Family history of ischemic heart disease and other diseases of the circulatory system: Secondary | ICD-10-CM

## 2021-02-03 DIAGNOSIS — Z8371 Family history of colonic polyps: Secondary | ICD-10-CM

## 2021-02-03 DIAGNOSIS — Z85038 Personal history of other malignant neoplasm of large intestine: Secondary | ICD-10-CM

## 2021-02-03 DIAGNOSIS — R262 Difficulty in walking, not elsewhere classified: Secondary | ICD-10-CM | POA: Diagnosis present

## 2021-02-03 DIAGNOSIS — L089 Local infection of the skin and subcutaneous tissue, unspecified: Secondary | ICD-10-CM | POA: Diagnosis present

## 2021-02-03 DIAGNOSIS — E1165 Type 2 diabetes mellitus with hyperglycemia: Secondary | ICD-10-CM | POA: Diagnosis present

## 2021-02-03 DIAGNOSIS — G8929 Other chronic pain: Secondary | ICD-10-CM | POA: Diagnosis present

## 2021-02-03 DIAGNOSIS — H409 Unspecified glaucoma: Secondary | ICD-10-CM | POA: Diagnosis present

## 2021-02-03 LAB — BASIC METABOLIC PANEL
Anion gap: 9 (ref 5–15)
BUN: 24 mg/dL — ABNORMAL HIGH (ref 8–23)
CO2: 29 mmol/L (ref 22–32)
Calcium: 8.5 mg/dL — ABNORMAL LOW (ref 8.9–10.3)
Chloride: 90 mmol/L — ABNORMAL LOW (ref 98–111)
Creatinine, Ser: 1.38 mg/dL — ABNORMAL HIGH (ref 0.44–1.00)
GFR, Estimated: 39 mL/min — ABNORMAL LOW (ref >60)
Glucose, Bld: 507 mg/dL (ref 70–99)
Potassium: 4 mmol/L (ref 3.5–5.1)
Sodium: 128 mmol/L — ABNORMAL LOW (ref 135–145)

## 2021-02-03 LAB — CBC WITH DIFFERENTIAL/PLATELET
Abs Immature Granulocytes: 0.01 10*3/uL (ref 0.00–0.07)
Basophils Absolute: 0.1 10*3/uL (ref 0.0–0.1)
Basophils Relative: 1 %
Eosinophils Absolute: 0.1 10*3/uL (ref 0.0–0.5)
Eosinophils Relative: 1 %
HCT: 36.2 % (ref 36.0–46.0)
Hemoglobin: 11.7 g/dL — ABNORMAL LOW (ref 12.0–15.0)
Immature Granulocytes: 0 %
Lymphocytes Relative: 25 %
Lymphs Abs: 2 10*3/uL (ref 0.7–4.0)
MCH: 27.8 pg (ref 26.0–34.0)
MCHC: 32.3 g/dL (ref 30.0–36.0)
MCV: 86 fL (ref 80.0–100.0)
Monocytes Absolute: 0.6 10*3/uL (ref 0.1–1.0)
Monocytes Relative: 8 %
Neutro Abs: 5.1 10*3/uL (ref 1.7–7.7)
Neutrophils Relative %: 65 %
Platelets: 329 10*3/uL (ref 150–400)
RBC: 4.21 MIL/uL (ref 3.87–5.11)
RDW: 13.4 % (ref 11.5–15.5)
WBC: 7.8 10*3/uL (ref 4.0–10.5)
nRBC: 0 % (ref 0.0–0.2)

## 2021-02-03 LAB — CBG MONITORING, ED
Glucose-Capillary: 477 mg/dL — ABNORMAL HIGH (ref 70–99)
Glucose-Capillary: 351 mg/dL — ABNORMAL HIGH (ref 70–99)

## 2021-02-03 LAB — LACTIC ACID, PLASMA: Lactic Acid, Venous: 1.3 mmol/L (ref 0.5–1.9)

## 2021-02-03 MED ORDER — ACETAMINOPHEN 325 MG PO TABS
650.0000 mg | ORAL_TABLET | Freq: Once | ORAL | Status: AC
  Administered 2021-02-03: 650 mg via ORAL
  Filled 2021-02-03: qty 2

## 2021-02-03 MED ORDER — INSULIN GLARGINE 100 UNIT/ML ~~LOC~~ SOLN
10.0000 [IU] | Freq: Once | SUBCUTANEOUS | Status: AC
  Administered 2021-02-03: 10 [IU] via SUBCUTANEOUS
  Filled 2021-02-03: qty 1

## 2021-02-03 MED ORDER — INSULIN ASPART 100 UNIT/ML IJ SOLN
12.0000 [IU] | Freq: Once | INTRAMUSCULAR | Status: AC
  Administered 2021-02-03: 12 [IU] via SUBCUTANEOUS

## 2021-02-03 MED ORDER — INSULIN REGULAR HUMAN 100 UNIT/ML IJ SOLN: 12.0000 [IU] | Freq: Once | INTRAMUSCULAR | Status: DC

## 2021-02-03 MED ORDER — VANCOMYCIN HCL 750 MG/150ML IV SOLN
750.0000 mg | INTRAVENOUS | Status: DC
  Administered 2021-02-04 – 2021-02-06 (×3): 750 mg via INTRAVENOUS
  Filled 2021-02-03 (×4): qty 150

## 2021-02-03 MED ORDER — PIPERACILLIN-TAZOBACTAM 3.375 G IVPB 30 MIN
3.3750 g | Freq: Once | INTRAVENOUS | Status: AC
  Administered 2021-02-03: 3.375 g via INTRAVENOUS
  Filled 2021-02-03: qty 50

## 2021-02-03 MED ORDER — SODIUM CHLORIDE 0.9 % IV BOLUS
500.0000 mL | Freq: Once | INTRAVENOUS | Status: AC
  Administered 2021-02-03: 500 mL via INTRAVENOUS

## 2021-02-03 MED ORDER — SODIUM CHLORIDE 0.9 % IV SOLN: INTRAVENOUS | Status: DC | PRN

## 2021-02-03 MED ORDER — VANCOMYCIN HCL IN DEXTROSE 1-5 GM/200ML-% IV SOLN
1000.0000 mg | Freq: Once | INTRAVENOUS | Status: AC
  Administered 2021-02-03: 1000 mg via INTRAVENOUS
  Filled 2021-02-03: qty 200

## 2021-02-03 NOTE — Progress Notes (Signed)
Pharmacy Antibiotic Note  Mary [name] is a 80 y.o. female admitted on 02/03/2021 with cellulitis.  Pharmacy has been consulted for vancomycin dosing.  WBC wnl, SCR 1.38  Plan: -Vancomycin 1 gm IV load followed by Vancomycin 750 mg IV Q 24 hrs. Goal AUC 400-550. Expected AUC: 521 SCr used: 1.38 -F/u maintenance gram negative coverage -Monitor CBC, renal fx, cultures and clinical progress -Vanc levels as indicated    Height: 5\' 7"  (170.2 cm) Weight: 92.5 kg (204 lb) IBW/kg (Calculated) : 61.6  Temp (24hrs), Avg:98.7 F (37.1 C), Min:98.7 F (37.1 C), Max:98.7 F (37.1 C)  Recent Labs  Lab 02/03/21 2016  WBC 7.8  CREATININE 1.38*    Estimated Creatinine Clearance: 38.6 mL/min (A) (by C-G formula based on SCr of 1.38 mg/dL (H)).    Allergies  Allergen Reactions  . Other Other (See Comments)    NO Blind Scopes with Naso Gastric tube.  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 2009 Also, a (name not recalled) chemo med caused PERMANENT NEUROPATHY and affected sense of taste  . Adhesive [Tape]     Tape - burns - pls use paper tape or elastic bandage  . Aspirin Other (See Comments)    S/P gastric bypass surgery, states her MD told her to not take Aspirin.  . Latex     Tape= burns  . Nsaids Other (See Comments)    S/P gastric bypass-told not to take. GI bleeds with NSAIDS    Antimicrobials this admission: Cefepime 6/7 >>  Zosyn 6/7 >>   Dose adjustments this admission:  Microbiology results: 6/7 BCx:    Thank you for allowing pharmacy to be a part of this patient's care.  Albertina Parr, PharmD., BCPS, BCCCP Clinical Pharmacist Please refer to [facility] for unit-specific pharmacist

## 2021-02-03 NOTE — Telephone Encounter (Signed)
Patient called and said that she has a sore on the padding of her left big tow. She said that she noticed it 2 days ago. Patient also said that she cannot touch it and it is swollen. Transferred to team health

## 2021-02-03 NOTE — ED Triage Notes (Signed)
Pt c/o left foot redness, blisters, swelling x 4 days-denies injury-NAD-to triage in w/c

## 2021-02-03 NOTE — ED Provider Notes (Signed)
[facility] EMERGENCY DEPARTMENT Provider Note   CSN: [id number] Arrival date & time: 02/03/21  1746     History Chief Complaint  Patient presents with  . Foot Pain    Mary [name] is a 80 y.o. female.  HPI Patient is a 80 year old female with a past medical history significant for colon cancer status post chemo with secondary neuropathy from her chemotherapy agent.  She states that 3 days ago she started having bilateral foot pain states that over the past 2 days   Patient states that this weekend she took longer for her and noticed that on the left side her foot was red and seem to be swollen.  Seems of gotten worse over the course of the last few days.  She denies any fevers or chills.  No nausea vomiting or diarrhea.  No chest pain shortness of breath lightheadedness or dizziness.  She states that the pain is severe when she is walking and aches which she is unable to walk.  Denies any falls or injuries.  Denies any cuts or abrasions to her foot that she knows of.     Past Medical History:  Diagnosis Date  . Arthritis   . Atrial tachycardia ([place]) 04/23/2017  . CAD (coronary artery disease)    non obstructive  . 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 Procedure complicated by an atrial lead dislodgment and repeat procedure, 12/2013   . Carotid stenosis    Carotid US 5/16:  Bilateral ICA 1-39%; L vertebral retrograde; L BP 126/49, R BP 140/57  . Chronic combined systolic and diastolic CHF (congestive heart failure) ([place])   . Chronic lower back pain   . Colon cancer ([place])    a. s/p chemo  . Colon polyps   . Diabetic peripheral neuropathy ([place])   . DVT (deep venous thrombosis) ([place]) 2013   "twice behind knee on left side" (06/25/2013)  . Glaucoma   . History of chicken pox   . Hyperlipidemia   . Hypertension   . Hypothyroidism   . Iron deficiency anemia   . Multinodular goiter   . Osteoporosis   .  PAF (paroxysmal atrial fibrillation) ([place])   . Sleep apnea    a. resolved post weight loss   . Type II diabetes mellitus [facility])     Patient Active Problem List   Diagnosis Date Noted  . Diabetic infection of left foot ([place]) 02/03/2021  . Right wrist pain 10/05/2019  . OSA (obstructive sleep apnea) 06/07/2018  . COPD (chronic obstructive pulmonary disease) ([place]) 05/24/2018  . Acute on chronic combined systolic and diastolic CHF (congestive heart failure) ([place]) 05/24/2018  . Acute respiratory failure with hypoxia ([place]) 05/21/2018  . Osteoarthritis 05/25/2017  . Atrial tachycardia ([place]) 04/23/2017  . Second degree AV block, Mobitz type II   . Depression 08/02/2016  . Chronic diastolic heart failure ([place]) 08/02/2016  . Tobacco abuse 08/02/2016  . Right lumbar radiculitis 06/09/2016  . Tear of medial meniscus of right knee, current 04/22/2016  . Chronic venous insufficiency 02/21/2015  . 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 Atrioventricular block, complete ([place]) 01/02/2014  . Chronic anticoagulation 10/09/2013  . PAF (paroxysmal atrial fibrillation) ([place]) 08/29/2013  . Obesity 05/05/2013  . CAD in native artery   . Diabetes mellitus with complication ([place])   . Hypothyroidism   . Hypertension   . Colon cancer (  [place])   . Hyperlipidemia associated with type 2 diabetes mellitus ([place])     Past Surgical History:  Procedure Laterality Date  . CARDIAC CATHETERIZATION  2006   Never had PCI, 3 caths total. Last one in [place]  . CARDIAC CATHETERIZATION N/A 02/04/2015   Procedure: Left Heart Cath and Coronary Angiography;  Surgeon: Belva Crome, MD;  Location: [facility] CV LAB;  Service: Cardiovascular;  Laterality: N/A;  . CATARACT EXTRACTION, BILATERAL Bilateral 2013   "and put stent in my left eye for glaucoma" (06/25/2013)  . CHOLECYSTECTOMY  1980's  . COLECTOMY  10/2010   Tumor removal  . EYE MUSCLE SURGERY Bilateral ~ 1963    "muscles too long; eyes would go out and up; tied muscles to hold my eyes straight" (06/25/2013)  . LEAD REVISION  01-03-2014   atrial lead revision by Dr Rayann Heman  . LEAD REVISION N/A 01/03/2014   Procedure: LEAD REVISION;  Surgeon: Coralyn Mark, MD;  Location: [facility] CATH LAB;  Service: Cardiovascular;  Laterality: N/A;  . LEFT HEART CATH AND CORONARY ANGIOGRAPHY N/A 05/29/2018   Procedure: LEFT HEART CATH AND CORONARY ANGIOGRAPHY;  Surgeon: Belva Crome, MD;  Location: [facility] CV LAB;  Service: Cardiovascular;  Laterality: N/A;  . PACEMAKER INSERTION  01/02/2014   STJ Assurity dual chamber pacemaker implnated by Dr Lovena Le for SSS  . PERMANENT PACEMAKER INSERTION N/A 01/02/2014   Procedure: PERMANENT PACEMAKER INSERTION;  Surgeon: Evans Lance, MD;  Location: [facility] C[facility] LAB;  Service: Cardiovascular;  Laterality: N/A;  . PORTACATH PLACEMENT Left 11/2010  . ROUX-EN-Y GASTRIC BYPASS  2009  . ULNAR TUNNEL RELEASE Left ~ 2008  . UMBILICAL HERNIA REPAIR  1970's?    (06/25/2013)     OB History   No obstetric history on file.     Family History  Problem Relation Age of Onset  . Breast cancer Mother   . Diabetes Mother   . Heart disease Father   . Heart attack Father   . Heart attack Sister   . Heart attack Brother   . Bladder Cancer Brother   . Prostate cancer Brother   . Colon polyps Brother   . Breast cancer Sister   . Breast cancer Maternal Aunt        x 2 aunts  . Pancreatic cancer Brother   . Colon polyps Brother   . Bladder Cancer Cousin   . Clotting disorder Sister   . Diabetes Sister        x 3  . Diabetes Maternal Grandmother     Social History   Tobacco Use  . Smoking status: Former Smoker    Packs/day: 1.00    Years: 30.00    Pack years: 30.00    Types: Cigarettes    Quit date: 02/2017    Years since quitting: 3.9  . Smokeless tobacco: Never Used  . Tobacco comment: started in her [age] until 2009   Vaping Use  . Vaping Use: Never used  Substance Use Topics   . Alcohol use: Not Currently    Alcohol/week: 0.0 standard drinks  . Drug use: No    Home Medications Prior to Admission medications   Medication Sig Start Date End Date Taking? Authorizing Provider  albuterol (PROVENTIL HFA;VENTOLIN HFA) 108 (90 Base) MCG/ACT inhaler Inhale 2 puffs into the lungs every 6 (six) hours as needed for wheezing or shortness of breath. 05/30/18   Regalado, Belkys A, MD  atorvastatin (LIPITOR) 80 MG tablet TAKE 1 TABLET(80  MG) BY MOUTH DAILY 09/17/19   Hoyt Koch, MD  betamethasone dipropionate (DIPROLENE) 0.05 % ointment Apply topically 2 (two) times daily. 05/19/20   Gareth Morgan, MD  bimatoprost (LUMIGAN) 0.01 % SOLN Place 1 drop into the right eye at bedtime.     [provider]  buPROPion (WELLBUTRIN XL) 150 MG 24 hr tablet TAKE 1 TABLET(150 MG) BY MOUTH DAILY 06/19/20   Hoyt Koch, MD  ELIQUIS 5 MG TABS tablet TAKE 1 TABLET BY MOUTH TWICE A DAY 11/03/20   Belva Crome, MD  HYDROcodone-acetaminophen (NORCO/VICODIN) 5-325 MG tablet Take 1 tablet by mouth every 4 (four) hours as needed for moderate pain. 01/27/21   Hoyt Koch, MD  insulin glargine (LANTUS) 100 UNIT/ML injection Inject into the skin daily. Sliding Scale    [provider]  levothyroxine (SYNTHROID, LEVOTHROID) 125 MCG tablet Take 125 mcg by mouth daily before breakfast. 01/10/18   [provider]  metoprolol succinate (TOPROL-XL) 25 MG 24 hr tablet TAKE 1/2 TABLETS AT BEDTIME 01/06/21   Belva Crome, MD  nitroGLYCERIN (NITROSTAT) 0.4 MG SL tablet Place 1 tablet (0.4 mg total) under the tongue every 5 (five) minutes as needed for chest pain. 04/09/19   Belva Crome, MD  Potassium Chloride ER 20 MEQ TBCR TAKE 2 TABLETS BY MOUTH TWICE A DAY 11/25/20   Belva Crome, MD  potassium chloride SA (K-DUR) 20 MEQ tablet Take 2 tablets (40 mEq total) by mouth 2 (two) times daily. 02/28/19   Richardson Dopp T, PA-C  spironolactone (ALDACTONE) 25 MG  tablet Take 0.5 tablets (12.5 mg total) by mouth daily. 01/06/21   Belva Crome, MD  torsemide (DEMADEX) 20 MG tablet Take 20 mg by mouth 3 (three) times daily. 2 in the AM; 1 in the PM    [provider]    Allergies    Other, Adhesive [tape], Aspirin, Latex, and Nsaids  Review of Systems   Review of Systems  Constitutional: Negative for chills and fever.  HENT: Negative for congestion.   Eyes: Negative for pain.  Respiratory: Negative for cough and shortness of breath.   Cardiovascular: Negative for chest pain and leg swelling.  Gastrointestinal: Negative for abdominal pain and vomiting.  Genitourinary: Negative for dysuria.  Musculoskeletal: Negative for myalgias.  Skin: Positive for wound. Negative for rash.  Neurological: Negative for dizziness and headaches.    Physical Exam Updated Vital Signs BP 124/77   Pulse 70   Temp 98.7 F (37.1 C) (Oral)   Resp 18   Ht 5\' 7"  (1.702 m)   Wt 92.5 kg   SpO2 100%   BMI 31.95 kg/m   Physical Exam Vitals and nursing note reviewed.  Constitutional:      General: She is not in acute distress.    Appearance: Normal appearance. She is not ill-appearing.  HENT:     Head: Normocephalic and atraumatic.  Eyes:     General: No scleral icterus.       Right eye: No discharge.        Left eye: No discharge.     Conjunctiva/sclera: Conjunctivae normal.  Pulmonary:     Effort: Pulmonary effort is normal.     Breath sounds: No stridor.  Musculoskeletal:     Comments: Left MTP with redness and swelling.  There is tenderness to palpation of this area See pictures below There is some fluctuance in this area.  No purulence expressed.  Skin:    General:  Skin is warm and dry.  Neurological:     Mental Status: She is alert and oriented to person, place, and time. Mental status is at baseline.  Psychiatric:        Mood and Affect: Mood normal.        Behavior: Behavior normal.         ED Results / Procedures / Treatments    Labs (all labs ordered are listed, but only abnormal results are displayed) Labs Reviewed  CBC WITH DIFFERENTIAL/PLATELET - Abnormal; Notable for the following components:      Result Value   Hemoglobin 11.7 (*)    All other components within normal limits  BASIC METABOLIC PANEL - Abnormal; Notable for the following components:   Sodium 128 (*)    Chloride 90 (*)    Glucose, Bld 507 (*)    BUN 24 (*)    Creatinine, Ser 1.38 (*)    Calcium 8.5 (*)    GFR, Estimated 39 (*)    All other components within normal limits  CBG MONITORING, ED - Abnormal; Notable for the following components:   Glucose-Capillary 477 (*)    All other components within normal limits  CBG MONITORING, ED - Abnormal; Notable for the following components:   Glucose-Capillary 351 (*)    All other components within normal limits  CULTURE, BLOOD (ROUTINE X 2)  CULTURE, BLOOD (ROUTINE X 2)  SARS CORONAVIRUS 2 (TAT 6-24 HRS)  RESP PANEL BY RT-PCR (FLU A&B, COVID) ARPGX2  LACTIC ACID, PLASMA  URINALYSIS, ROUTINE W REFLEX MICROSCOPIC    EKG None  Radiology DG Foot Complete Left  Result Date: 02/03/2021 CLINICAL DATA:  Left foot wound under the first MTP joint with erythema and swelling EXAM: LEFT FOOT - COMPLETE 3+ VIEW COMPARISON:  12/12/2020 left foot radiographs FINDINGS: Generalized left foot soft tissue swelling. Vascular calcifications throughout the soft tissues. No fracture or dislocation. Cortical erosion at plantar base of the proximal phalanx in the left first toe. Mild first MTP joint osteoarthritis. No radiopaque foreign bodies. IMPRESSION: 1. Generalized left foot soft tissue swelling. Cortical erosion at the plantar base of the proximal phalanx in the left first toe, worrisome for acute osteomyelitis. Consider MRI of the left foot without IV contrast as clinically warranted. 2. Mild first MTP joint osteoarthritis. Electronically Signed   By: Ilona Sorrel M.D.   On: 02/03/2021 20:29     Procedures Procedures   Medications Ordered in ED Medications  vancomycin (VANCOREADY) IVPB 750 mg/150 mL (has no administration in time range)  0.9 %  sodium chloride infusion ( Intravenous New Bag/Given 02/03/21 2149)  acetaminophen (TYLENOL) tablet 650 mg (has no administration in time range)  traZODone (DESYREL) tablet 25 mg (has no administration in time range)  polyethylene glycol (MIRALAX / GLYCOLAX) packet 17 g (has no administration in time range)  ondansetron (ZOFRAN) tablet 4 mg (has no administration in time range)    Or  ondansetron (ZOFRAN) injection 4 mg (has no administration in time range)  acetaminophen (TYLENOL) tablet 650 mg (650 mg Oral Given 02/03/21 2004)  piperacillin-tazobactam (ZOSYN) IVPB 3.375 g (0 g Intravenous Stopped 02/03/21 2233)  vancomycin (VANCOCIN) IVPB 1000 mg/200 mL premix (0 mg Intravenous Stopped 02/03/21 2337)  insulin aspart (novoLOG) injection 12 Units (12 Units Subcutaneous Given 02/03/21 2201)  sodium chloride 0.9 % bolus 500 mL (0 mLs Intravenous Stopped 02/04/21 0021)  insulin glargine (LANTUS) injection 10 Units (10 Units Subcutaneous Given 02/03/21 2333)  oxyCODONE-acetaminophen (PERCOCET/ROXICET) 5-325 MG per tablet  1 tablet (1 tablet Oral Given 02/04/21 0022)    ED Course  I have reviewed the triage vital signs and the nursing notes.  Pertinent labs & imaging results that were available during my care of the patient were reviewed by me and considered in my medical decision making (see chart for details).  Patient is a 80 year old female presenting today with diabetic foot ulcer.  There is some purulence present.  She is afebrile with no systemic symptoms.  Her symptoms seem to have been going on for the last 3 to 4 days seems to have been worsened over the past 1 or 2 days.  Has a history of diabetes states she has not used her Lantus for the past 2 days.  She uses sliding scale Lantus insulin.  Physical exam concerning for infected foot  ulcer given the timeline and her neuropathy some concern for osteomyelitis.  We will obtain x-ray and lab work.  CBC without leukocytosis or anemia.  Sodium 128 chloride 90 consistent with some dehydration and this may be some pseudohyponatremia due to her hyperglycemia.  No anion gap, not in DKA.  This is likely due to her noncompliance with her insulin over the past 2 days.  Her reason for not taking insulin is her concern over her foot infection.  BUN somewhat elevated will gently rehydrate with IV fluids.  Creatinine 1.38 which is not severely changed from 6 months ago.  Clinical Course as of 02/04/21 0026  Tue Feb 03, 2021  2312 Discussed with Cleon Dew MD of internal medicine who will [facility] patient to [facility].  Will administer 500 mL of NS, will provide with Lantus for long-acting insulin coverage.  Patient updated on plan. [WF]  Wed Feb 04, 2021  0017 PRN order placed for tylenol every 6 hours 650mg .  (PRN will start at 2am-->6 hours after her first dose of 650).   Will provide with one dose of percocet (325 of tylenol). She has been here 6.5 hours and will have had a total of 1g of tylenol in that time.  [WF]    Clinical Course User Index [WF] Tedd Sias, [place]   X-ray concerning for osteomyelitis.  Vanco and Zosyn began empirically after to ask blood cultures were obtained.  Lactic within normal limit initially.  She is having no systemic symptoms no indication for repeat lactic.  Patient given 1 dose of insulin short acting as well as long-acting insulin.  Hospitalist has already accepted patient.  Will place hospitalist as needed orders including Tylenol, Zofran, trazodone, MiraLAX.  MDM Rules/Calculators/A&P                          Patient reassessed 12:26 AM at end of shift.  Denies any symptoms at this time apart from some pain in foot. Will provide with one dose of percocet.   Final Clinical Impression(s) / ED Diagnoses Final diagnoses:  Diabetic foot infection  ([place])  Hyperglycemia    Rx / DC Orders ED Discharge Orders    None       Tedd Sias, [place] 02/04/21 1660    Sherwood Gambler, MD 02/04/21 [phone]

## 2021-02-04 DIAGNOSIS — L97529 Non-pressure chronic ulcer of other part of left foot with unspecified severity: Secondary | ICD-10-CM | POA: Diagnosis present

## 2021-02-04 DIAGNOSIS — R262 Difficulty in walking, not elsewhere classified: Secondary | ICD-10-CM | POA: Diagnosis present

## 2021-02-04 DIAGNOSIS — I2510 Atherosclerotic heart disease of native coronary artery without angina pectoris: Secondary | ICD-10-CM | POA: Diagnosis present

## 2021-02-04 DIAGNOSIS — E039 Hypothyroidism, unspecified: Secondary | ICD-10-CM | POA: Diagnosis present

## 2021-02-04 DIAGNOSIS — E1169 Type 2 diabetes mellitus with other specified complication: Secondary | ICD-10-CM | POA: Diagnosis present

## 2021-02-04 DIAGNOSIS — I959 Hypotension, unspecified: Secondary | ICD-10-CM | POA: Diagnosis not present

## 2021-02-04 DIAGNOSIS — E876 Hypokalemia: Secondary | ICD-10-CM | POA: Diagnosis not present

## 2021-02-04 DIAGNOSIS — E1142 Type 2 diabetes mellitus with diabetic polyneuropathy: Secondary | ICD-10-CM | POA: Diagnosis present

## 2021-02-04 DIAGNOSIS — E1165 Type 2 diabetes mellitus with hyperglycemia: Secondary | ICD-10-CM | POA: Diagnosis present

## 2021-02-04 DIAGNOSIS — Z20822 Contact with and (suspected) exposure to covid-19: Secondary | ICD-10-CM | POA: Diagnosis present

## 2021-02-04 DIAGNOSIS — D509 Iron deficiency anemia, unspecified: Secondary | ICD-10-CM | POA: Diagnosis present

## 2021-02-04 DIAGNOSIS — M869 Osteomyelitis, unspecified: Secondary | ICD-10-CM | POA: Diagnosis present

## 2021-02-04 DIAGNOSIS — T451X5A Adverse effect of antineoplastic and immunosuppressive drugs, initial encounter: Secondary | ICD-10-CM | POA: Diagnosis present

## 2021-02-04 DIAGNOSIS — L089 Local infection of the skin and subcutaneous tissue, unspecified: Secondary | ICD-10-CM

## 2021-02-04 DIAGNOSIS — L03116 Cellulitis of left lower limb: Secondary | ICD-10-CM | POA: Diagnosis present

## 2021-02-04 DIAGNOSIS — I480 Paroxysmal atrial fibrillation: Secondary | ICD-10-CM | POA: Diagnosis present

## 2021-02-04 DIAGNOSIS — E11621 Type 2 diabetes mellitus with foot ulcer: Secondary | ICD-10-CM | POA: Diagnosis present

## 2021-02-04 DIAGNOSIS — N1831 Chronic kidney disease, stage 3a: Secondary | ICD-10-CM | POA: Diagnosis present

## 2021-02-04 DIAGNOSIS — E11628 Type 2 diabetes mellitus with other skin complications: Secondary | ICD-10-CM | POA: Diagnosis not present

## 2021-02-04 DIAGNOSIS — I5042 Chronic combined systolic (congestive) and diastolic (congestive) heart failure: Secondary | ICD-10-CM | POA: Diagnosis present

## 2021-02-04 DIAGNOSIS — E1122 Type 2 diabetes mellitus with diabetic chronic kidney disease: Secondary | ICD-10-CM | POA: Diagnosis present

## 2021-02-04 DIAGNOSIS — I441 Atrioventricular block, second degree: Secondary | ICD-10-CM | POA: Diagnosis present

## 2021-02-04 DIAGNOSIS — I130 Hypertensive heart and chronic kidney disease with heart failure and stage 1 through stage 4 chronic kidney disease, or unspecified chronic kidney disease: Secondary | ICD-10-CM | POA: Diagnosis present

## 2021-02-04 DIAGNOSIS — H409 Unspecified glaucoma: Secondary | ICD-10-CM | POA: Diagnosis present

## 2021-02-04 DIAGNOSIS — G620 Drug-induced polyneuropathy: Secondary | ICD-10-CM | POA: Diagnosis present

## 2021-02-04 LAB — RESP PANEL BY RT-PCR (FLU A&B, COVID) ARPGX2
Influenza A by PCR: NEGATIVE
Influenza B by PCR: NEGATIVE
SARS Coronavirus 2 by RT PCR: NEGATIVE

## 2021-02-04 LAB — COMPREHENSIVE METABOLIC PANEL
ALT: 9 U/L (ref 0–44)
AST: 12 U/L — ABNORMAL LOW (ref 15–41)
Albumin: 3.1 g/dL — ABNORMAL LOW (ref 3.5–5.0)
Alkaline Phosphatase: 108 U/L (ref 38–126)
Anion gap: 9 (ref 5–15)
BUN: 24 mg/dL — ABNORMAL HIGH (ref 8–23)
CO2: 30 mmol/L (ref 22–32)
Calcium: 8.9 mg/dL (ref 8.9–10.3)
Chloride: 95 mmol/L — ABNORMAL LOW (ref 98–111)
Creatinine, Ser: 1.31 mg/dL — ABNORMAL HIGH (ref 0.44–1.00)
GFR, Estimated: 41 mL/min — ABNORMAL LOW (ref >60)
Glucose, Bld: 176 mg/dL — ABNORMAL HIGH (ref 70–99)
Potassium: 3.4 mmol/L — ABNORMAL LOW (ref 3.5–5.1)
Sodium: 134 mmol/L — ABNORMAL LOW (ref 135–145)
Total Bilirubin: 0.5 mg/dL (ref 0.3–1.2)
Total Protein: 6.7 g/dL (ref 6.5–8.1)

## 2021-02-04 LAB — CBC WITH DIFFERENTIAL/PLATELET
Abs Immature Granulocytes: 0.02 10*3/uL (ref 0.00–0.07)
Basophils Absolute: 0.1 10*3/uL (ref 0.0–0.1)
Basophils Relative: 1 %
Eosinophils Absolute: 0.2 10*3/uL (ref 0.0–0.5)
Eosinophils Relative: 2 %
HCT: 35.5 % — ABNORMAL LOW (ref 36.0–46.0)
Hemoglobin: 11.4 g/dL — ABNORMAL LOW (ref 12.0–15.0)
Immature Granulocytes: 0 %
Lymphocytes Relative: 33 %
Lymphs Abs: 2.6 10*3/uL (ref 0.7–4.0)
MCH: 27.9 pg (ref 26.0–34.0)
MCHC: 32.1 g/dL (ref 30.0–36.0)
MCV: 86.8 fL (ref 80.0–100.0)
Monocytes Absolute: 0.6 10*3/uL (ref 0.1–1.0)
Monocytes Relative: 7 %
Neutro Abs: 4.5 10*3/uL (ref 1.7–7.7)
Neutrophils Relative %: 57 %
Platelets: 302 10*3/uL (ref 150–400)
RBC: 4.09 MIL/uL (ref 3.87–5.11)
RDW: 13.5 % (ref 11.5–15.5)
WBC: 7.9 10*3/uL (ref 4.0–10.5)
nRBC: 0 % (ref 0.0–0.2)

## 2021-02-04 LAB — GLUCOSE, CAPILLARY
Glucose-Capillary: 164 mg/dL — ABNORMAL HIGH (ref 70–99)
Glucose-Capillary: 223 mg/dL — ABNORMAL HIGH (ref 70–99)
Glucose-Capillary: 236 mg/dL — ABNORMAL HIGH (ref 70–99)
Glucose-Capillary: 308 mg/dL — ABNORMAL HIGH (ref 70–99)

## 2021-02-04 LAB — CBG MONITORING, ED: Glucose-Capillary: 139 mg/dL — ABNORMAL HIGH (ref 70–99)

## 2021-02-04 LAB — MAGNESIUM: Magnesium: 1.8 mg/dL (ref 1.7–2.4)

## 2021-02-04 LAB — HEPARIN LEVEL (UNFRACTIONATED): Heparin Unfractionated: 0.84 IU/mL — ABNORMAL HIGH (ref 0.30–0.70)

## 2021-02-04 LAB — SARS CORONAVIRUS 2 (TAT 6-24 HRS): SARS Coronavirus 2: NEGATIVE

## 2021-02-04 LAB — APTT: aPTT: 28 seconds (ref 24–36)

## 2021-02-04 MED ORDER — LEVOTHYROXINE SODIUM 25 MCG PO TABS
137.0000 ug | ORAL_TABLET | Freq: Every day | ORAL | Status: DC
  Administered 2021-02-04 – 2021-02-07 (×4): 137 ug via ORAL
  Filled 2021-02-04 (×4): qty 1

## 2021-02-04 MED ORDER — ATORVASTATIN CALCIUM 40 MG PO TABS
80.0000 mg | ORAL_TABLET | Freq: Every day | ORAL | Status: DC
  Administered 2021-02-04 – 2021-02-07 (×4): 80 mg via ORAL
  Filled 2021-02-04 (×4): qty 2

## 2021-02-04 MED ORDER — ONDANSETRON HCL 4 MG/2ML IJ SOLN: 4.0000 mg | Freq: Four times a day (QID) | INTRAMUSCULAR | Status: DC | PRN

## 2021-02-04 MED ORDER — BUPROPION HCL ER (XL) 150 MG PO TB24
150.0000 mg | ORAL_TABLET | Freq: Every day | ORAL | Status: DC
  Administered 2021-02-04 – 2021-02-07 (×4): 150 mg via ORAL
  Filled 2021-02-04 (×4): qty 1

## 2021-02-04 MED ORDER — APIXABAN 5 MG PO TABS
5.0000 mg | ORAL_TABLET | Freq: Two times a day (BID) | ORAL | Status: DC
  Administered 2021-02-04 – 2021-02-07 (×6): 5 mg via ORAL
  Filled 2021-02-04 (×6): qty 1

## 2021-02-04 MED ORDER — INSULIN ASPART 100 UNIT/ML IJ SOLN
0.0000 [IU] | Freq: Three times a day (TID) | INTRAMUSCULAR | Status: DC
  Administered 2021-02-04 (×2): 7 [IU] via SUBCUTANEOUS
  Administered 2021-02-04: 4 [IU] via SUBCUTANEOUS
  Administered 2021-02-05 (×2): 15 [IU] via SUBCUTANEOUS
  Administered 2021-02-06 (×2): 7 [IU] via SUBCUTANEOUS
  Administered 2021-02-06 – 2021-02-07 (×2): 4 [IU] via SUBCUTANEOUS

## 2021-02-04 MED ORDER — POLYETHYLENE GLYCOL 3350 17 G PO PACK
17.0000 g | PACK | Freq: Every day | ORAL | Status: DC | PRN
  Filled 2021-02-04: qty 1

## 2021-02-04 MED ORDER — SODIUM CHLORIDE 0.9 % IV SOLN
2.0000 g | Freq: Two times a day (BID) | INTRAVENOUS | Status: DC
  Administered 2021-02-04 – 2021-02-07 (×7): 2 g via INTRAVENOUS
  Filled 2021-02-04 (×7): qty 2

## 2021-02-04 MED ORDER — NITROGLYCERIN 0.4 MG SL SUBL: 0.4000 mg | SUBLINGUAL_TABLET | SUBLINGUAL | Status: DC | PRN

## 2021-02-04 MED ORDER — HYDROCODONE-ACETAMINOPHEN 5-325 MG PO TABS
1.0000 | ORAL_TABLET | ORAL | Status: DC | PRN
  Administered 2021-02-05 – 2021-02-07 (×2): 1 via ORAL
  Filled 2021-02-04 (×2): qty 1

## 2021-02-04 MED ORDER — POTASSIUM CHLORIDE CRYS ER 20 MEQ PO TBCR
40.0000 meq | EXTENDED_RELEASE_TABLET | Freq: Two times a day (BID) | ORAL | Status: DC
  Administered 2021-02-04 – 2021-02-07 (×7): 40 meq via ORAL
  Filled 2021-02-04 (×7): qty 2

## 2021-02-04 MED ORDER — VITAMIN D (ERGOCALCIFEROL) 1.25 MG (50000 UNIT) PO CAPS
50000.0000 [IU] | ORAL_CAPSULE | ORAL | Status: DC
  Administered 2021-02-04: 50000 [IU] via ORAL
  Filled 2021-02-04: qty 1

## 2021-02-04 MED ORDER — SPIRONOLACTONE 12.5 MG HALF TABLET
12.5000 mg | ORAL_TABLET | Freq: Every day | ORAL | Status: DC
  Administered 2021-02-04 – 2021-02-07 (×4): 12.5 mg via ORAL
  Filled 2021-02-04 (×4): qty 1

## 2021-02-04 MED ORDER — MORPHINE SULFATE (PF) 2 MG/ML IV SOLN: 2.0000 mg | INTRAVENOUS | Status: DC | PRN

## 2021-02-04 MED ORDER — ONDANSETRON HCL 4 MG PO TABS
4.0000 mg | ORAL_TABLET | Freq: Four times a day (QID) | ORAL | Status: DC | PRN
  Filled 2021-02-04: qty 1

## 2021-02-04 MED ORDER — LATANOPROST 0.005 % OP SOLN
1.0000 [drp] | Freq: Every day | OPHTHALMIC | Status: DC
  Administered 2021-02-04 – 2021-02-06 (×3): 1 [drp] via OPHTHALMIC
  Filled 2021-02-04: qty 2.5

## 2021-02-04 MED ORDER — TORSEMIDE 20 MG PO TABS
20.0000 mg | ORAL_TABLET | Freq: Two times a day (BID) | ORAL | Status: DC
  Administered 2021-02-04 – 2021-02-07 (×6): 20 mg via ORAL
  Filled 2021-02-04 (×7): qty 1

## 2021-02-04 MED ORDER — HEPARIN (PORCINE) 25000 UT/250ML-% IV SOLN
1200.0000 [IU]/h | INTRAVENOUS | Status: DC
  Administered 2021-02-04: 1200 [IU]/h via INTRAVENOUS
  Filled 2021-02-04: qty 250

## 2021-02-04 MED ORDER — ACETAMINOPHEN 325 MG PO TABS: 650.0000 mg | ORAL_TABLET | Freq: Four times a day (QID) | ORAL | Status: DC | PRN

## 2021-02-04 MED ORDER — OXYCODONE-ACETAMINOPHEN 5-325 MG PO TABS
1.0000 | ORAL_TABLET | ORAL | Status: AC
  Administered 2021-02-04: 1 via ORAL
  Filled 2021-02-04: qty 1

## 2021-02-04 MED ORDER — METOPROLOL SUCCINATE ER 25 MG PO TB24
12.5000 mg | ORAL_TABLET | Freq: Every day | ORAL | Status: DC
  Administered 2021-02-04 – 2021-02-06 (×3): 12.5 mg via ORAL
  Filled 2021-02-04 (×3): qty 1

## 2021-02-04 MED ORDER — TRAZODONE HCL 50 MG PO TABS: 25.0000 mg | ORAL_TABLET | Freq: Every evening | ORAL | Status: DC | PRN

## 2021-02-04 MED ORDER — INSULIN ASPART 100 UNIT/ML IJ SOLN
0.0000 [IU] | Freq: Every day | INTRAMUSCULAR | Status: DC
  Administered 2021-02-04 – 2021-02-05 (×2): 4 [IU] via SUBCUTANEOUS

## 2021-02-04 NOTE — Telephone Encounter (Signed)
See below

## 2021-02-04 NOTE — Plan of Care (Signed)
Care plan initiated.

## 2021-02-04 NOTE — Consult Note (Addendum)
  Subjective:  Patient ID: Mary [name], female    DOB: 1941/01/11,  MRN: [id number]  A 80 y.o. female presents with medical history significant of DM2, HLD, HTN, a fib with left hallux blister with cellulitis up to the midshaft of the metatarsal.  Patient states she does not have any pain.  She has neuropathy from likely chemo as well as in setting of diabetes.  She came to the emergency room after primary care physician recommended given the nature of how the foot looked.  She had x-ray done which was concerning for osteomyelitis.  The primary team consulted me for further evaluation. Objective:   Vitals:   02/04/21 0611 02/04/21 1000  BP: 110/69 108/77  Pulse: 69 70  Resp: 19 20  Temp: 97.8 F (36.6 C) 97.8 F (36.6 C)  SpO2: 100% 100%   General AA&O x3. Normal mood and affect.  Vascular Dorsalis pedis and posterior tibial pulses 2/4 bilat. Brisk capillary refill to all digits. Pedal hair present.  Neurologic Epicritic sensation grossly intact.  Dermatologic  left first metatarsophalangeal joint fracture blister status postdebridement with granular skin.  Does not probe down to deep tissue. Redness noted up to the MPJ joint.  No malodor present.  No purulent drainage noted.  Orthopedic: MMT 5/5 in dorsiflexion, plantarflexion, inversion, and eversion. Normal joint ROM without pain or crepitus.      Assessment & Plan:  Patient was evaluated and treated and all questions answered.  Left submetatarsal 1 friction blister with infection -All questions and concerns were addressed with the patient in extensive detail -X-rays were reviewed at this time I am not concerned for osteomyelitis as there is no clinical indication of probing down to bone.  Patient may have had a previous fracture from unknown etiology which seems to be more consistent with the presentation of the x-ray. -At this time I will hold off on MRI. -Patient will need 48 hours of antibiotics and can be discharged on  p.o. doxycycline for 14 days. -She can be weightbearing as tolerated with surgical shoe -I will see her in clinic 1 week from discharge at [facility] wound care with Betadine wet-to-dry dressing changes once a day.   Felipa Furnace, DPM  Accessible via secure chat for questions or concerns.

## 2021-02-04 NOTE — Progress Notes (Signed)
Pharmacy Antibiotic Note  Mary [name] is a 80 y.o. female admitted on 02/03/2021 with osteomyelitis.  Pharmacy has been consulted for vancomycin and cefepime dosing.  Pt with PMH significant for chemotherapy induced neuropathy presenting with bilateral foot pain. Broad spectrum antibiotics initiated.   Today, 02/04/21  WBC WNL  SCr 1.31, CrCl ~40 mL/min  Afebrile  Plan:  Cefepime 2 g IV q12h  Vancomycin 750 mg IV q24h for estimated AUC 499  Goal vancomycin AUC 400-550. Check steady state levels as needed  Monitor renal function, culture data  Height: 5\' 7"  (170.2 cm) Weight: 92.5 kg (204 lb) IBW/kg (Calculated) : 61.6  Temp (24hrs), Avg:98.3 F (36.8 C), Min:97.8 F (36.6 C), Max:98.7 F (37.1 C)  Recent Labs  Lab 02/03/21 2016 02/03/21 2125 02/04/21 0742  WBC 7.8  --  7.9  CREATININE 1.38*  --  1.31*  LATICACIDVEN  --  1.3  --     Estimated Creatinine Clearance: 40.7 mL/min (A) (by C-G formula based on SCr of 1.31 mg/dL (H)).    Allergies  Allergen Reactions  . Other Other (See Comments)    NO Blind Scopes with Naso Gastric tube.  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 2009 Also, a (name not recalled) chemo med caused PERMANENT NEUROPATHY and affected sense of taste  . Adhesive [Tape]     Tape - burns - pls use paper tape or elastic bandage  . Aspirin Other (See Comments)    S/P gastric bypass surgery, states her MD told her to not take Aspirin.  . Latex     Tape= burns  . Nsaids Other (See Comments)    S/P gastric bypass-told not to take. GI bleeds with NSAIDS    Antimicrobials this admission: vancomycin 6/7 >>  cefepime 6/8 >>  Pip/tazo 6/7 x1 dose  Dose adjustments this admission:  Microbiology results: 6/7 BCx: ngtd  Thank you for allowing pharmacy to be a part of this patient's care.  Lenis Noon, PharmD 02/04/2021 8:44 AM

## 2021-02-04 NOTE — Telephone Encounter (Signed)
Agree with ER.

## 2021-02-04 NOTE — Telephone Encounter (Signed)
[employer] FY 6.7.22I:  ---Caller states she has diabetic neuropathy. Has a blister on the Lft bottom of the foot and is having some swelling. Toe is very red and swollen. Onset 2 days ago when noticed. Too painful to walk on. Uses cane w/ assistance.  Advised pt to go to the ED now, pt understood

## 2021-02-04 NOTE — Progress Notes (Signed)
ANTICOAGULATION CONSULT NOTE - Initial Consult  Pharmacy Consult for heparin Indication: atrial fibrillation, apixaban on hold  Allergies  Allergen Reactions  . Other Other (See Comments)    NO Blind Scopes with Naso Gastric tube.  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 2009 Also, a (name not recalled) chemo med caused PERMANENT NEUROPATHY and affected sense of taste  . Adhesive [Tape]     Tape - burns - pls use paper tape or elastic bandage  . Aspirin Other (See Comments)    S/P gastric bypass surgery, states her MD told her to not take Aspirin.  . Latex     Tape= burns  . Nsaids Other (See Comments)    S/P gastric bypass-told not to take. GI bleeds with NSAIDS    Patient Measurements: Height: 5\' 7"  (170.2 cm) Weight: 92.5 kg (204 lb) IBW/kg (Calculated) : 61.6 Heparin Dosing Weight: 81 kg  Vital Signs: Temp: 97.8 F (36.6 C) (06/08 0611) Temp Source: Oral (06/08 0611) BP: 110/69 (06/08 0611) Pulse Rate: 69 (06/08 0611)  Labs: Recent Labs    02/03/21 2016 02/04/21 0742  HGB 11.7* 11.4*  HCT 36.2 35.5*  PLT 329 302  CREATININE 1.38* 1.31*    Estimated Creatinine Clearance: 40.7 mL/min (A) (by C-G formula based on SCr of 1.31 mg/dL (H)).   Medical History: Past Medical History:  Diagnosis Date  . Arthritis   . Atrial tachycardia ([place]) 04/23/2017  . CAD (coronary artery disease)    non obstructive  . 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 Procedure complicated by an atrial lead dislodgment and repeat procedure, 12/2013   . Carotid stenosis    Carotid US 5/16:  Bilateral ICA 1-39%; L vertebral retrograde; L BP 126/49, R BP 140/57  . Chronic combined systolic and diastolic CHF (congestive heart failure) ([place])   . Chronic lower back pain   . Colon cancer ([place])    a. s/p chemo  . Colon polyps   . Diabetic peripheral neuropathy ([place])   . DVT (deep venous thrombosis) ([place]) 2013   "twice behind knee on left side"  (06/25/2013)  . Glaucoma   . History of chicken pox   . Hyperlipidemia   . Hypertension   . Hypothyroidism   . Iron deficiency anemia   . Multinodular goiter   . Osteoporosis   . PAF (paroxysmal atrial fibrillation) ([place])   . Sleep apnea    a. resolved post weight loss   . Type II diabetes mellitus ([facility])     Medications: Apixaban 5 mg PO BID PTA for afib -Last dose reported on 6/7  Assessment: Pt is a 62 yoF with PMH of atrial fibrillation for which she is prescribed apixaban. Presenting with foot wound, concern for OM. Pharmacy consulted to dose heparin for afib while apixaban is being held for possible surgical intervention.   Today, 02/04/21  CBC: Hgb slightly low at baseline (11.4), Plt WNL  Baseline HL and aPTT ordered, anticipate HL to be falsely elevated due to recent DOAC  SCr 1.31, CrCl ~40 mL/min  Goal of Therapy:  Heparin level 0.3-0.7 units/ml aPTT 66-102 seconds Monitor platelets by anticoagulation protocol: Yes   Plan:   No bolus since transitioning from apixaban  Initiate heparin infusion at 1200 units/hr  HL/aPTT in 8 hours. Monitor using aPTT until aPTT and HL correlate  CBC, HL/aPTT daily while on heparin infusion  Monitor for signs of bleeding  Follow for ability to resume apixaban  Stanton Kidney  Shelda Jakes, PharmD 02/04/2021,9:02 AM

## 2021-02-04 NOTE — H&P (Signed)
History and Physical    IMAGINE NEST XBD:[id number] DOB: Aug 12, 1941 DOA: 02/03/2021  PCP: Hoyt Koch, MD  Patient coming from: Home  Chief Complaint: left foot pain  HPI: Mary [name] is a 80 y.o. female with medical history significant of DM2, HLD, HTN, a fib. Presenting with left foot pain. She noticed a throbbing pain in her left foot 2 days ago. She wasn't aware of any injury, but says that she has neuropathy. She had her husband look at her foot. He saw what looked like a blister and significant swelling under her left toe. She tried to get an appt w/ a podiatrist. However, they didn't have any new patient openings til July. She spoke with her PCP who recommended that she come to the ED. She denies any other aggravating or alleviating factors.   ED Course: She was found to be hyperglycemic. She was given insulin and fluid. Left foot XR was concerning for osteomyelitis. She was started on vanc, zosyn. TRH was called for admission.   Review of Systems:  Denies CP, dyspnea, palpitations, lightheadedness, dizziness, N/V/D, fevers. Review of systems is otherwise negative for all not mentioned in HPI.   PMHx Past Medical History:  Diagnosis Date  . Arthritis   . Atrial tachycardia ([place]) 04/23/2017  . CAD (coronary artery disease)    non obstructive  . 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 Procedure complicated by an atrial lead dislodgment and repeat procedure, 12/2013   . Carotid stenosis    Carotid US 5/16:  Bilateral ICA 1-39%; L vertebral retrograde; L BP 126/49, R BP 140/57  . Chronic combined systolic and diastolic CHF (congestive heart failure) ([place])   . Chronic lower back pain   . Colon cancer ([place])    a. s/p chemo  . Colon polyps   . Diabetic peripheral neuropathy ([place])   . DVT (deep venous thrombosis) ([place]) 2013   "twice behind knee on left side" (06/25/2013)  . Glaucoma   . History of chicken pox   .  Hyperlipidemia   . Hypertension   . Hypothyroidism   . Iron deficiency anemia   . Multinodular goiter   . Osteoporosis   . PAF (paroxysmal atrial fibrillation) ([place])   . Sleep apnea    a. resolved post weight loss   . Type II diabetes mellitus ([place])     PSHx Past Surgical History:  Procedure Laterality Date  . CARDIAC CATHETERIZATION  2006   Never had PCI, 3 caths total. Last one in [place]  . CARDIAC CATHETERIZATION N/A 02/04/2015   Procedure: Left Heart Cath and Coronary Angiography;  Surgeon: Belva Crome, MD;  Location: [facility];  Service: Cardiovascular;  Laterality: N/A;  . CATARACT EXTRACTION, BILATERAL Bilateral 2013   "and put stent in my left eye for glaucoma" (06/25/2013)  . CHOLECYSTECTOMY  1980's  . COLECTOMY  10/2010   Tumor removal  . EYE MUSCLE SURGERY Bilateral ~ 1963   "muscles too long; eyes would go out and up; tied muscles to hold my eyes straight" (06/25/2013)  . LEAD REVISION  01-03-2014   atrial lead revision by Dr Rayann Heman  . LEAD REVISION N/A 01/03/2014   Procedure: LEAD REVISION;  Surgeon: Coralyn Mark, MD;  Location: [facility];  Service: Cardiovascular;  Laterality: N/A;  . LEFT HEART CATH AND CORONARY ANGIOGRAPHY N/A 05/29/2018   Procedure: LEFT HEART CATH AND CORONARY ANGIOGRAPHY;  Surgeon: Daneen Schick  W, MD;  Location: [facility];  Service: Cardiovascular;  Laterality: N/A;  . PACEMAKER INSERTION  01/02/2014   STJ Assurity dual chamber pacemaker implnated by Dr Lovena Le for SSS  . PERMANENT PACEMAKER INSERTION N/A 01/02/2014   Procedure: PERMANENT PACEMAKER INSERTION;  Surgeon: Evans Lance, MD;  Location: [facility] C[facility];  Service: Cardiovascular;  Laterality: N/A;  . PORTACATH PLACEMENT Left 11/2010  . ROUX-EN-Y GASTRIC BYPASS  2009  . ULNAR TUNNEL RELEASE Left ~ 2008  . UMBILICAL HERNIA REPAIR  1970's?    (06/25/2013)    SocHx  reports that she quit smoking about 3 years ago. Her smoking use included cigarettes. She has a 30.00  pack-year smoking history. She has never used smokeless tobacco. She reports previous alcohol use. She reports that she does not use drugs.  Allergies  Allergen Reactions  . Other Other (See Comments)    NO Blind Scopes with Naso Gastric tube.  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 2009 Also, a (name not recalled) chemo med caused PERMANENT NEUROPATHY and affected sense of taste  . Adhesive [Tape]     Tape - burns - pls use paper tape or elastic bandage  . Aspirin Other (See Comments)    S/P gastric bypass surgery, states her MD told her to not take Aspirin.  . Latex     Tape= burns  . Nsaids Other (See Comments)    S/P gastric bypass-told not to take. GI bleeds with NSAIDS    FamHx Family History  Problem Relation Age of Onset  . Breast cancer Mother   . Diabetes Mother   . Heart disease Father   . Heart attack Father   . Heart attack Sister   . Heart attack Brother   . Bladder Cancer Brother   . Prostate cancer Brother   . Colon polyps Brother   . Breast cancer Sister   . Breast cancer Maternal Aunt        x 2 aunts  . Pancreatic cancer Brother   . Colon polyps Brother   . Bladder Cancer Cousin   . Clotting disorder Sister   . Diabetes Sister        x 3  . Diabetes Maternal Grandmother     Prior to Admission medications   Medication Sig Start Date End Date Taking? Authorizing Provider  albuterol (PROVENTIL HFA;VENTOLIN HFA) 108 (90 Base) MCG/ACT inhaler Inhale 2 puffs into the lungs every 6 (six) hours as needed for wheezing or shortness of breath. 05/30/18   Regalado, Belkys A, MD  atorvastatin (LIPITOR) 80 MG tablet TAKE 1 TABLET(80 MG) BY MOUTH DAILY 09/17/19   Hoyt Koch, MD  betamethasone dipropionate (DIPROLENE) 0.05 % ointment Apply topically 2 (two) times daily. 05/19/20   Gareth Morgan, MD  bimatoprost (LUMIGAN) 0.01 % SOLN Place 1 drop into the right eye at bedtime.     [provider]  buPROPion (WELLBUTRIN XL) 150 MG 24 hr tablet TAKE 1 TABLET(150  MG) BY MOUTH DAILY 06/19/20   Hoyt Koch, MD  ELIQUIS 5 MG TABS tablet TAKE 1 TABLET BY MOUTH TWICE A DAY 11/03/20   Belva Crome, MD  HYDROcodone-acetaminophen (NORCO/VICODIN) 5-325 MG tablet Take 1 tablet by mouth every 4 (four) hours as needed for moderate pain. 01/27/21   Hoyt Koch, MD  insulin glargine (LANTUS) 100 UNIT/ML injection Inject into the skin daily. Sliding Scale    [provider]  levothyroxine (SYNTHROID, LEVOTHROID) 125 MCG tablet Take 125 mcg by mouth  daily before breakfast. 01/10/18   [provider]  metoprolol succinate (TOPROL-XL) 25 MG 24 hr tablet TAKE 1/2 TABLETS AT BEDTIME 01/06/21   Belva Crome, MD  nitroGLYCERIN (NITROSTAT) 0.4 MG SL tablet Place 1 tablet (0.4 mg total) under the tongue every 5 (five) minutes as needed for chest pain. 04/09/19   Belva Crome, MD  Potassium Chloride ER 20 MEQ TBCR TAKE 2 TABLETS BY MOUTH TWICE A DAY 11/25/20   Belva Crome, MD  potassium chloride SA (K-DUR) 20 MEQ tablet Take 2 tablets (40 mEq total) by mouth 2 (two) times daily. 02/28/19   Richardson Dopp T, PA-C  spironolactone (ALDACTONE) 25 MG tablet Take 0.5 tablets (12.5 mg total) by mouth daily. 01/06/21   Belva Crome, MD  torsemide (DEMADEX) 20 MG tablet Take 20 mg by mouth 3 (three) times daily. 2 in the AM; 1 in the PM    [provider]    Physical Exam: Vitals:   02/04/21 0200 02/04/21 0300 02/04/21 0400 02/04/21 0611  BP: 100/73 94/69 97/73  110/69  Pulse: 70   69  Resp: 16 10 15 19   Temp:    97.8 F (36.6 C)  TempSrc:    Oral  SpO2: 97% 98% 98% 100%  Weight:      Height:        General: 80 y.o. female resting in bed in NAD Eyes: PERRL, normal sclera ENMT: Nares patent w/o discharge, orophaynx clear, dentition normal, ears w/o discharge/lesions/ulcers Neck: Supple, trachea midline Cardiovascular: RRR, +S1, S2, no m/g/r, equal pulses throughout Respiratory: CTABL, no w/r/r, normal WOB GI: BS+, NDNT, no masses  noted, no organomegaly noted MSK: No e/c/c, blister noted at base of left great toe, somewhat TTP Skin: No rashes, bruises, ulcerations noted Neuro: A&O x 3, no focal deficits Psyc: Appropriate interaction and affect, calm/cooperative  Labs on Admission: I have personally reviewed following labs and imaging studies  CBC: Recent Labs  Lab 02/03/21 2016  WBC 7.8  NEUTROABS 5.1  HGB 11.7*  HCT 36.2  MCV 86.0  PLT 914   Basic Metabolic Panel: Recent Labs  Lab 02/03/21 2016  NA 128*  K 4.0  CL 90*  CO2 29  GLUCOSE 507*  BUN 24*  CREATININE 1.38*  CALCIUM 8.5*   GFR: Estimated Creatinine Clearance: 38.6 mL/min (A) (by C-G formula based on SCr of 1.38 mg/dL (H)). Liver Function Tests: No results for input(s): AST, ALT, ALKPHOS, BILITOT, PROT, ALBUMIN in the last 168 hours. No results for input(s): LIPASE, AMYLASE in the last 168 hours. No results for input(s): AMMONIA in the last 168 hours. Coagulation Profile: No results for input(s): INR, PROTIME in the last 168 hours. Cardiac Enzymes: No results for input(s): CKTOTAL, CKMB, CKMBINDEX, TROPONINI in the last 168 hours. BNP (last 3 results) Recent Labs    05/29/20 1608  PROBNP 1,027*   HbA1C: No results for input(s): HGBA1C in the last 72 hours. CBG: Recent Labs  Lab 02/03/21 2153 02/03/21 2328 02/04/21 0317  GLUCAP 477* 351* 139*   Lipid Profile: No results for input(s): CHOL, HDL, LDLCALC, TRIG, CHOLHDL, LDLDIRECT in the last 72 hours. Thyroid Function Tests: No results for input(s): TSH, T4TOTAL, FREET4, T3FREE, THYROIDAB in the last 72 hours. Anemia Panel: No results for input(s): VITAMINB12, FOLATE, FERRITIN, TIBC, IRON, RETICCTPCT in the last 72 hours. Urine analysis:    Component Value Date/Time   COLORURINE YELLOW 08/01/2016 [place] 08/01/2016 0539   LABSPEC 1.007 08/01/2016 0539  PHURINE 6.0 08/01/2016 0539   GLUCOSEU NEGATIVE 08/01/2016 0539   HGBUR NEGATIVE 08/01/2016 0539    BILIRUBINUR NEGATIVE 08/01/2016 [place] 08/01/2016 0539   PROTEINUR NEGATIVE 08/01/2016 0539   NITRITE NEGATIVE 08/01/2016 0539   LEUKOCYTESUR MODERATE (A) 08/01/2016 0539    Radiological Exams on Admission: DG Foot Complete Left  Result Date: 02/03/2021 CLINICAL DATA:  Left foot wound under the first MTP joint with erythema and swelling EXAM: LEFT FOOT - COMPLETE 3+ VIEW COMPARISON:  12/12/2020 left foot radiographs FINDINGS: Generalized left foot soft tissue swelling. Vascular calcifications throughout the soft tissues. No fracture or dislocation. Cortical erosion at plantar base of the proximal phalanx in the left first toe. Mild first MTP joint osteoarthritis. No radiopaque foreign bodies. IMPRESSION: 1. Generalized left foot soft tissue swelling. Cortical erosion at the plantar base of the proximal phalanx in the left first toe, worrisome for acute osteomyelitis. Consider MRI of the left foot without IV contrast as clinically warranted. 2. Mild first MTP joint osteoarthritis. Electronically Signed   By: Ilona Sorrel M.D.   On: 02/03/2021 20:29    EKG: None obtain in ED.   Assessment/Plan Left great toe osteomyelitis     - admitted to inpt, tele     - started on vanc, zosyn; will switch to vanc, cefepime to watch out for renal function     - spoke with podiatry; they will see her today, hold on further imaging for now (unable to get MRI d/t pacer)     - will hold her eliquis and switch her to heparin gtt until decision made on whether or not she would need surgery  DM2 uncontrolled     - check A1c     - place on SSI, DM diet, glucose checks     - she came in with a glucose of 500+; it's much improved this morning; her last A1c was 11.7 in 01/2019     - she says she takes lantus on a sliding scale; will continue the lantus 12 units qHS for now  CKD3a     - baseline Scr seems to be between 1.1 and 1.4. She's at 1.38 now; follow  A fib on chronic  anticoagulation 2nd degree AV block; s/p pacer placement Chronic diastolic HF     - no CP, dyspnea, increased peripheral edema     - continue home regimen, except for eliquis; will change that to heparin gtt for now   Hypokalemia     - replace K+, check Mg2+  Hypothyroidism     - continue home synthroid  HLD     - continue home statin  DVT prophylaxis: heparin gtt Code Status: FULL  Family Communication: None at bedside  Consults called: Podiatry (Dr. Posey Pronto)   Status is: Inpatient  Remains inpatient appropriate because:Inpatient level of care appropriate due to severity of illness   Dispo: The patient is from: Home              Anticipated d/c is to: Home              Patient currently is not medically stable to d/c.   Difficult to place patient No  Time spent coordinating admission: 70 minutes  [facility]  If 7PM-7AM, please contact night-coverage [email or web address]  02/04/2021, 7:14 AM

## 2021-02-05 LAB — COMPREHENSIVE METABOLIC PANEL
ALT: 11 U/L (ref 0–44)
AST: 16 U/L (ref 15–41)
Albumin: 3.2 g/dL — ABNORMAL LOW (ref 3.5–5.0)
Alkaline Phosphatase: 103 U/L (ref 38–126)
Anion gap: 7 (ref 5–15)
BUN: 24 mg/dL — ABNORMAL HIGH (ref 8–23)
CO2: 27 mmol/L (ref 22–32)
Calcium: 9 mg/dL (ref 8.9–10.3)
Chloride: 99 mmol/L (ref 98–111)
Creatinine, Ser: 1.33 mg/dL — ABNORMAL HIGH (ref 0.44–1.00)
GFR, Estimated: 41 mL/min — ABNORMAL LOW (ref >60)
Glucose, Bld: 264 mg/dL — ABNORMAL HIGH (ref 70–99)
Potassium: 4.5 mmol/L (ref 3.5–5.1)
Sodium: 133 mmol/L — ABNORMAL LOW (ref 135–145)
Total Bilirubin: 0.5 mg/dL (ref 0.3–1.2)
Total Protein: 6.8 g/dL (ref 6.5–8.1)

## 2021-02-05 LAB — BLOOD CULTURE ID PANEL (REFLEXED) - BCID2

## 2021-02-05 LAB — GLUCOSE, CAPILLARY
Glucose-Capillary: 329 mg/dL — ABNORMAL HIGH (ref 70–99)
Glucose-Capillary: 302 mg/dL — ABNORMAL HIGH (ref 70–99)
Glucose-Capillary: 90 mg/dL (ref 70–99)
Glucose-Capillary: 303 mg/dL — ABNORMAL HIGH (ref 70–99)

## 2021-02-05 LAB — CBC
HCT: 34.9 % — ABNORMAL LOW (ref 36.0–46.0)
Hemoglobin: 11.2 g/dL — ABNORMAL LOW (ref 12.0–15.0)
MCH: 27.9 pg (ref 26.0–34.0)
MCHC: 32.1 g/dL (ref 30.0–36.0)
MCV: 87 fL (ref 80.0–100.0)
Platelets: 305 10*3/uL (ref 150–400)
RBC: 4.01 MIL/uL (ref 3.87–5.11)
RDW: 13.6 % (ref 11.5–15.5)
WBC: 7.6 10*3/uL (ref 4.0–10.5)
nRBC: 0 % (ref 0.0–0.2)

## 2021-02-05 LAB — HEMOGLOBIN A1C
Hgb A1c MFr Bld: >15.5 % — ABNORMAL HIGH (ref 4.8–5.6)
Mean Plasma Glucose: >398 mg/dL

## 2021-02-05 MED ORDER — INSULIN GLARGINE 100 UNIT/ML ~~LOC~~ SOLN
12.0000 [IU] | Freq: Every day | SUBCUTANEOUS | Status: DC
  Administered 2021-02-05 – 2021-02-07 (×3): 12 [IU] via SUBCUTANEOUS
  Filled 2021-02-05 (×3): qty 0.12

## 2021-02-05 NOTE — Progress Notes (Signed)
PHARMACY - PHYSICIAN COMMUNICATION CRITICAL VALUE ALERT - BLOOD CULTURE IDENTIFICATION (BCID)  Mary [name] is an 80 y.o. female who presented to [facility] on 02/03/2021 with a chief complaint of left foot pain.  Assessment:  Patient with left great toe osteomyelitis. Afebrile, WBC WNL, VSS.  Aerobic bottle of 1 blood cx set + Staph species, no resistance.  Possible contaminant and currently on antibiotics that will cover this if not.   Name of physician (or Provider) Contacted: J Mansy  Current antibiotics: Vancomycin + Cefepime  Changes to prescribed antibiotics recommended:  Patient is on recommended antibiotics - No changes needed Continue to monitor cultures.   Results for orders placed or performed during the hospital encounter of 02/03/21  Blood Culture ID Panel (Reflexed) (Collected: 02/03/2021  9:29 PM)  Result Value Ref Range   Enterococcus faecalis NOT DETECTED NOT DETECTED   Enterococcus Faecium NOT DETECTED NOT DETECTED   Listeria monocytogenes NOT DETECTED NOT DETECTED   Staphylococcus species DETECTED (A) NOT DETECTED   Staphylococcus aureus (BCID) NOT DETECTED NOT DETECTED   Staphylococcus epidermidis NOT DETECTED NOT DETECTED   Staphylococcus lugdunensis NOT DETECTED NOT DETECTED   Streptococcus species NOT DETECTED NOT DETECTED   Streptococcus agalactiae NOT DETECTED NOT DETECTED   Streptococcus pneumoniae NOT DETECTED NOT DETECTED   Streptococcus pyogenes NOT DETECTED NOT DETECTED   A.calcoaceticus-baumannii NOT DETECTED NOT DETECTED   Bacteroides fragilis NOT DETECTED NOT DETECTED   Enterobacterales NOT DETECTED NOT DETECTED   Enterobacter cloacae complex NOT DETECTED NOT DETECTED   Escherichia coli NOT DETECTED NOT DETECTED   Klebsiella aerogenes NOT DETECTED NOT DETECTED   Klebsiella oxytoca NOT DETECTED NOT DETECTED   Klebsiella pneumoniae NOT DETECTED NOT DETECTED   Proteus species NOT DETECTED NOT DETECTED   Salmonella species NOT DETECTED NOT  DETECTED   Serratia marcescens NOT DETECTED NOT DETECTED   Haemophilus influenzae NOT DETECTED NOT DETECTED   Neisseria meningitidis NOT DETECTED NOT DETECTED   Pseudomonas aeruginosa NOT DETECTED NOT DETECTED   Stenotrophomonas maltophilia NOT DETECTED NOT DETECTED   Candida albicans NOT DETECTED NOT DETECTED   Candida auris NOT DETECTED NOT DETECTED   Candida glabrata NOT DETECTED NOT DETECTED   Candida krusei NOT DETECTED NOT DETECTED   Candida parapsilosis NOT DETECTED NOT DETECTED   Candida tropicalis NOT DETECTED NOT DETECTED   Cryptococcus neoformans/gattii NOT DETECTED NOT DETECTED    Netta Cedars PharmD 02/05/2021  5:37 AM

## 2021-02-05 NOTE — Progress Notes (Signed)
Inpatient Diabetes Program Recommendations  AACE/ADA: New Consensus Statement on Inpatient Glycemic Control (2015)  Target Ranges:  Prepandial:   less than 140 mg/dL      Peak postprandial:   less than 180 mg/dL (1-2 hours)      Critically ill patients:  140 - 180 mg/dL   Lab Results  Component Value Date   GLUCAP 329 (H) 02/05/2021   HGBA1C 11.7 (H) 02/08/2019    Review of Glycemic Control Results for Mary [name], Mary [name] (MRN [id number]) as of 02/05/2021 11:05  Ref. Range 02/04/2021 07:53 02/04/2021 12:18 02/04/2021 16:40 02/04/2021 19:55 02/05/2021 10:03  Glucose-Capillary Latest Ref Range: 70 - 99 mg/dL 164 (H) Novolog 4 units 223 (H) Novolog 7 units 236 (H) Novolog 7 units 308 (H) Novolog 4 units 329 (H) Novolog 15 units   Diabetes history: DM2 Outpatient Diabetes medications: Lantus sliding scale Current orders for Inpatient glycemic control: Novolog correction scale 0-20 units tid + hs 0-5 units  Inpatient Diabetes Program Recommendations:   -Lantus 12 units qd ( 0.15 units/kg x 92.5 kg = 14 units) Secure chat sent to Dr. Dwyane Dee.  Thank you, Nani Gasser. Jatasia Gundrum, RN, MSN, CDE  Diabetes Coordinator Inpatient Glycemic Control Team Team Pager [phone] (8am-5pm) 02/05/2021 11:15 AM

## 2021-02-05 NOTE — Progress Notes (Signed)
PROGRESS NOTE    Mary [name]  IZT:[id number] DOB: [birth year] DOA: 02/03/2021 PCP: Hoyt Koch, MD   Brief Narrative:  This 80 years old female with PMH significant for DM2, HLD, HTN, A. fib presented in the ED with left foot pain.  Patient reports having throbbing left foot pain for 2 days, she denies any injury but says she has neuropathy.  Patient reports her husband has looked at her foot and stated that she has significant swelling under the left toe associated with a blister.  She tried to get an appointment with podiatrist however there is no availability until July.  Her PCP has advised her to go to the ER.  She was found to be hyperglycemic in the ER and was given insulin and IV fluids, left foot x-ray was concerning for osteomyelitis.  Patient was started on IV vancomycin and cefepime.  Podiatry was consulted recommended there is no indication for surgery at this point, recommended IV antibiotic for 48 hours followed by doxycycline p.o. for 2 weeks and outpatient follow-up.   Assessment & Plan:   Active Problems:   Diabetic infection of left foot ([place])  Left great toe osteomyelitis: She has chronic nonhealing wound/ulcer under left great toe. Left foot x-ray concerning for osteomyelitis. Patient was started on antibiotics (IV vancomycin and cefepime). Podiatry was consulted states there is no indication for surgery at this point. Recommended IV antibiotic for 48 hours followed by doxycycline for 2 weeks and outpatient clinic follow-up in 1 week.  DM2 uncontrolled: Hemoglobin A1c 11.7 in 6/20 Obtain hemoglobin A1c. Continue Lantus 12 units and moderate sliding scale.  CKD stage IIIa: Serum creatinine at baseline.  A. fib on chronic anticoagulation. Continue Eliquis.  Heart rate controlled S/p pacemaker placement.  Hypokalemia: Improved  Hypothyroidism: continue Synthroid   Hyperlipidemia : continue home statin.    DVT prophylaxis: Eliquis Code Status:  Full code Family Communication: No family at bedside Disposition Plan:     Status is: Inpatient  Remains inpatient appropriate because:Inpatient level of care appropriate due to severity of illness  Dispo: The patient is from: Home              Anticipated d/c is to: Home              Patient currently is not medically stable to d/c.   Difficult to place patient No  Consultants:  Podiatry  Procedures:  None Antimicrobials:   Anti-infectives (From admission, onward)    Start     Dose/Rate Route Frequency Ordered Stop   02/04/21 1800  vancomycin (VANCOREADY) IVPB 750 mg/150 mL        750 mg 150 mL/hr over 60 Minutes Intravenous Every 24 hours 02/03/21 2123     02/04/21 0930  ceFEPIme (MAXIPIME) 2 g in sodium chloride 0.9 % 100 mL IVPB        2 g 200 mL/hr over 30 Minutes Intravenous Every 12 hours 02/04/21 0840     02/03/21 2130  vancomycin (VANCOCIN) IVPB 1000 mg/200 mL premix        1,000 mg 200 mL/hr over 60 Minutes Intravenous  Once 02/03/21 2123 02/03/21 2337   02/03/21 2115  piperacillin-tazobactam (ZOSYN) IVPB 3.375 g        3.375 g 100 mL/hr over 30 Minutes Intravenous  Once 02/03/21 2102 02/03/21 2233        Subjective: Patient was seen and examined at bedside.  Overnight events noted.  She denies any pain.  She reports she  has neuropathy.  Is she states she was told that she does not need surgery,  she needs oral antibiotics.  Objective: Vitals:   02/04/21 1959 02/05/21 0539 02/05/21 0940 02/05/21 0950  BP: 99/63 103/68 (!) 81/47 103/64  Pulse: 74 75 72   Resp: 12 14 15    Temp:  99.1 F (37.3 C) 98.2 F (36.8 C)   TempSrc:  Oral Oral   SpO2: 98% 93% 97%   Weight:      Height:        Intake/Output Summary (Last 24 hours) at 02/05/2021 1514 Last data filed at 02/05/2021 0630 Gross per 24 hour  Intake 400.73 ml  Output 200 ml  Net 200.73 ml   Filed Weights   02/03/21 1755 02/04/21 0611  Weight: 92.5 kg 92.5 kg    Examination:  General exam:  Appears calm and comfortable, not in any acute distress. Respiratory system: Clear to auscultation. Respiratory effort normal. Cardiovascular system: S1 & S2 heard, RRR. No JVD, murmurs, rubs, gallops or clicks. No pedal edema. Gastrointestinal system: Abdomen is nondistended, soft and nontender. No organomegaly or masses felt. Normal bowel sounds heard. Central nervous system: Alert and oriented. No focal neurological deficits. Extremities: Symmetric 5 x 5 power.  An open wound noted under left  great toe.   Mild erythema noted around the wound. Skin: No rashes, lesions or ulcers Psychiatry: Judgement and insight appear normal. Mood & affect appropriate.     Data Reviewed: I have personally reviewed following labs and imaging studies  CBC: Recent Labs  Lab 02/03/21 2016 02/04/21 0742 02/05/21 0350  WBC 7.8 7.9 7.6  NEUTROABS 5.1 4.5  --   HGB 11.7* 11.4* 11.2*  HCT 36.2 35.5* 34.9*  MCV 86.0 86.8 87.0  PLT 329 302 160   Basic Metabolic Panel: Recent Labs  Lab 02/03/21 2016 02/04/21 0742 02/05/21 0350  NA 128* 134* 133*  K 4.0 3.4* 4.5  CL 90* 95* 99  CO2 29 30 27   GLUCOSE 507* 176* 264*  BUN 24* 24* 24*  CREATININE 1.38* 1.31* 1.33*  CALCIUM 8.5* 8.9 9.0  MG  --  1.8  --    GFR: Estimated Creatinine Clearance: 40.1 mL/min (A) (by C-G formula based on SCr of 1.33 mg/dL (H)). Liver Function Tests: Recent Labs  Lab 02/04/21 0742 02/05/21 0350  AST 12* 16  ALT 9 11  ALKPHOS 108 103  BILITOT 0.5 0.5  PROT 6.7 6.8  ALBUMIN 3.1* 3.2*   No results for input(s): LIPASE, AMYLASE in the last 168 hours. No results for input(s): AMMONIA in the last 168 hours. Coagulation Profile: No results for input(s): INR, PROTIME in the last 168 hours. Cardiac Enzymes: No results for input(s): CKTOTAL, CKMB, CKMBINDEX, TROPONINI in the last 168 hours. BNP (last 3 results) Recent Labs    05/29/20 1608  PROBNP 1,027*   HbA1C: Recent Labs    02/04/21 0737  HGBA1C >15.5*    CBG: Recent Labs  Lab 02/04/21 1218 02/04/21 1640 02/04/21 1955 02/05/21 1003 02/05/21 1202  GLUCAP 223* 236* 308* 329* 302*   Lipid Profile: No results for input(s): CHOL, HDL, LDLCALC, TRIG, CHOLHDL, LDLDIRECT in the last 72 hours. Thyroid Function Tests: No results for input(s): TSH, T4TOTAL, FREET4, T3FREE, THYROIDAB in the last 72 hours. Anemia Panel: No results for input(s): VITAMINB12, FOLATE, FERRITIN, TIBC, IRON, RETICCTPCT in the last 72 hours. Sepsis Labs: Recent Labs  Lab 02/03/21 2125  LATICACIDVEN 1.3    Recent Results (from the past 240 hour(s))  Blood culture (routine x 2)     Status: None (Preliminary result)   Collection Time: 02/03/21  9:25 PM   Specimen: BLOOD RIGHT HAND  Result Value Ref Range Status   Specimen Description   Final    BLOOD RIGHT HAND Performed at [facility], [place] [address], [place], [place] [address]    Special Requests   Final    BOTTLES DRAWN AEROBIC AND ANAEROBIC Blood Culture adequate volume Performed at [facility], [address] [place], [place] [address]    Culture   Final    NO GROWTH 2 DAYS Performed at [facility], [place] [address] [place], [place] [address]    Report Status PENDING  Incomplete  SARS CORONAVIRUS 2 (TAT 6-24 HRS) Nasopharyngeal Peripheral     Status: None   Collection Time: 02/03/21  9:25 PM   Specimen: Peripheral; Nasopharyngeal  Result Value Ref Range Status   SARS Coronavirus 2 NEGATIVE NEGATIVE Final    Comment: (NOTE) SARS-CoV-2 target nucleic acids are NOT DETECTED.  The SARS-CoV-2 RNA is generally detectable in upper and lower respiratory specimens during the acute phase of infection. Negative results do not preclude SARS-CoV-2 infection, do not rule out co-infections with other pathogens, and should not be used as the sole basis for treatment or other patient management decisions. Negative results must be combined with clinical observations, patient history,  and epidemiological information. The expected result is Negative.  Fact Sheet for Patients: SugarRoll.be  Fact Sheet for Healthcare Providers: https://www.woods-mathews.com/  This test is not yet approved or cleared by the [place] FDA and  has been authorized for detection and/or diagnosis of SARS-CoV-2 by FDA under an Emergency Use Authorization (EUA). This EUA will remain  in effect (meaning this test can be used) for the duration of the COVID-19 declaration under Se ction 564(b)(1) of the Act, 21 U.S.C. section 360bbb-3(b)(1), unless the authorization is terminated or revoked sooner.  Performed at [facility], [place] [address], [place], [place] [address]   Blood culture (routine x 2)     Status: None (Preliminary result)   Collection Time: 02/03/21  9:29 PM   Specimen: BLOOD  Result Value Ref Range Status   Specimen Description   Final    BLOOD RIGHT ANTECUBITAL Performed at [facility], [address] [place], [place] [address]    Special Requests   Final    BOTTLES DRAWN AEROBIC AND ANAEROBIC Blood Culture adequate volume Performed at [facility], [address] [place], [place] [address]    Culture  Setup Time   Final    GRAM POSITIVE COCCI IN CLUSTERS IN BOTH AEROBIC AND ANAEROBIC BOTTLES CRITICAL RESULT CALLED TO, READ BACK BY AND VERIFIED WITH: PHARMD Randall Hiss [id number] FCP Performed at [facility], [place] [address], [place], [place] [address]    Culture [facility] POSITIVE COCCI  Final   Report Status PENDING  Incomplete  Blood Culture ID Panel (Reflexed)     Status: Abnormal   Collection Time: 02/03/21  9:29 PM  Result Value Ref Range Status   Enterococcus faecalis NOT DETECTED NOT DETECTED Final   Enterococcus Faecium NOT DETECTED NOT DETECTED Final   Listeria monocytogenes NOT DETECTED NOT DETECTED Final   Staphylococcus species DETECTED (A) NOT DETECTED Final    Comment:  CRITICAL RESULT CALLED TO, READ BACK BY AND VERIFIED WITH: [place] L [id number] FCP    Staphylococcus  aureus (BCID) NOT DETECTED NOT DETECTED Final   Staphylococcus epidermidis NOT DETECTED NOT DETECTED Final   Staphylococcus lugdunensis NOT DETECTED NOT DETECTED Final   Streptococcus species NOT DETECTED NOT DETECTED Final   Streptococcus agalactiae NOT DETECTED NOT DETECTED Final   Streptococcus pneumoniae NOT DETECTED NOT DETECTED Final   Streptococcus pyogenes NOT DETECTED NOT DETECTED Final   A.calcoaceticus-baumannii NOT DETECTED NOT DETECTED Final   Bacteroides fragilis NOT DETECTED NOT DETECTED Final   Enterobacterales NOT DETECTED NOT DETECTED Final   Enterobacter cloacae complex NOT DETECTED NOT DETECTED Final   Escherichia coli NOT DETECTED NOT DETECTED Final   Klebsiella aerogenes NOT DETECTED NOT DETECTED Final   Klebsiella oxytoca NOT DETECTED NOT DETECTED Final   Klebsiella pneumoniae NOT DETECTED NOT DETECTED Final   Proteus species NOT DETECTED NOT DETECTED Final   Salmonella species NOT DETECTED NOT DETECTED Final   Serratia marcescens NOT DETECTED NOT DETECTED Final   Haemophilus influenzae NOT DETECTED NOT DETECTED Final   Neisseria meningitidis NOT DETECTED NOT DETECTED Final   Pseudomonas aeruginosa NOT DETECTED NOT DETECTED Final   Stenotrophomonas maltophilia NOT DETECTED NOT DETECTED Final   Candida albicans NOT DETECTED NOT DETECTED Final   Candida auris NOT DETECTED NOT DETECTED Final   Candida glabrata NOT DETECTED NOT DETECTED Final   Candida krusei NOT DETECTED NOT DETECTED Final   Candida parapsilosis NOT DETECTED NOT DETECTED Final   Candida tropicalis NOT DETECTED NOT DETECTED Final   Cryptococcus neoformans/gattii NOT DETECTED NOT DETECTED Final    Comment: Performed at [facility], [address], [place], [place] [address]  Resp Panel by RT-PCR (Flu A&B, Covid) Nasopharyngeal Swab     Status: None   Collection Time: 02/04/21  1:40  AM   Specimen: Nasopharyngeal Swab; Nasopharyngeal(NP) swabs in vial transport medium  Result Value Ref Range Status   SARS Coronavirus 2 by RT PCR NEGATIVE NEGATIVE Final    Comment: (NOTE) SARS-CoV-2 target nucleic acids are NOT DETECTED.  The SARS-CoV-2 RNA is generally detectable in upper respiratory specimens during the acute phase of infection. The lowest concentration of SARS-CoV-2 viral copies this assay can detect is 138 copies/mL. A negative result does not preclude SARS-Cov-2 infection and should not be used as the sole basis for treatment or other patient management decisions. A negative result may occur with  improper specimen collection/handling, submission of specimen other than nasopharyngeal swab, presence of viral mutation(s) within the areas targeted by this assay, and inadequate number of viral copies(<138 copies/mL). A negative result must be combined with clinical observations, patient history, and epidemiological information. The expected result is Negative.  Fact Sheet for Patients:  EntrepreneurPulse.com.au  Fact Sheet for Healthcare Providers:  IncredibleEmployment.be  This test is no t yet approved or cleared by the [place] FDA and  has been authorized for detection and/or diagnosis of SARS-CoV-2 by FDA under an Emergency Use Authorization (EUA). This EUA will remain  in effect (meaning this test can be used) for the duration of the COVID-19 declaration under Section 564(b)(1) of the Act, 21 U.S.C.section 360bbb-3(b)(1), unless the authorization is terminated  or revoked sooner.       Influenza A by PCR NEGATIVE NEGATIVE Final   Influenza B by PCR NEGATIVE NEGATIVE Final    Comment: (NOTE) The Xpert Xpress SARS-CoV-2/FLU/RSV plus assay is intended as an aid in the diagnosis of influenza from Nasopharyngeal swab specimens and should not be used as a sole basis for treatment. Nasal washings and aspirates are  unacceptable for Xpert Xpress SARS-CoV-2/FLU/RSV testing.  Fact Sheet for Patients: EntrepreneurPulse.com.au  Fact Sheet for Healthcare Providers: IncredibleEmployment.be  This test is not yet approved or cleared by the [place] FDA and has been authorized for detection and/or diagnosis of SARS-CoV-2 by FDA under an Emergency Use Authorization (EUA). This EUA will remain in effect (meaning this test can be used) for the duration of the COVID-19 declaration under Section 564(b)(1) of the Act, 21 U.S.C. section 360bbb-3(b)(1), unless the authorization is terminated or revoked.  Performed at [facility], [address] [place], [place] [address]     Radiology Studies: DG Foot Complete Left  Result Date: 02/03/2021 CLINICAL DATA:  Left foot wound under the first MTP joint with erythema and swelling EXAM: LEFT FOOT - COMPLETE 3+ VIEW COMPARISON:  12/12/2020 left foot radiographs FINDINGS: Generalized left foot soft tissue swelling. Vascular calcifications throughout the soft tissues. No fracture or dislocation. Cortical erosion at plantar base of the proximal phalanx in the left first toe. Mild first MTP joint osteoarthritis. No radiopaque foreign bodies. IMPRESSION: 1. Generalized left foot soft tissue swelling. Cortical erosion at the plantar base of the proximal phalanx in the left first toe, worrisome for acute osteomyelitis. Consider MRI of the left foot without IV contrast as clinically warranted. 2. Mild first MTP joint osteoarthritis. Electronically Signed   By: Ilona Sorrel M.D.   On: 02/03/2021 20:29     Scheduled Meds:  apixaban  5 mg Oral BID   atorvastatin  80 mg Oral Daily   buPROPion  150 mg Oral Daily   insulin aspart  0-20 Units Subcutaneous TID WC   insulin aspart  0-5 Units Subcutaneous QHS   insulin glargine  12 Units Subcutaneous Daily   latanoprost  1 drop Right Eye QHS   levothyroxine  137 mcg Oral Q0600    metoprolol succinate  12.5 mg Oral QHS   potassium chloride SA  40 mEq Oral BID   spironolactone  12.5 mg Oral Daily   torsemide  20 mg Oral BID   Vitamin D (Ergocalciferol)  50,000 Units Oral Q7 days   Continuous Infusions:  sodium chloride Stopped (02/04/21 0318)   ceFEPime (MAXIPIME) IV 2 g (02/05/21 1006)   vancomycin Stopped (02/04/21 2331)     LOS: 1 day    Time spent: 35 mins    Jakayden Cancio, MD [facility]   If 7PM-7AM, please contact night-coverage

## 2021-02-06 LAB — GLUCOSE, CAPILLARY
Glucose-Capillary: 239 mg/dL — ABNORMAL HIGH (ref 70–99)
Glucose-Capillary: 210 mg/dL — ABNORMAL HIGH (ref 70–99)
Glucose-Capillary: 170 mg/dL — ABNORMAL HIGH (ref 70–99)
Glucose-Capillary: 155 mg/dL — ABNORMAL HIGH (ref 70–99)

## 2021-02-06 LAB — CBC
HCT: 33.8 % — ABNORMAL LOW (ref 36.0–46.0)
Hemoglobin: 10.8 g/dL — ABNORMAL LOW (ref 12.0–15.0)
MCH: 27.9 pg (ref 26.0–34.0)
MCHC: 32 g/dL (ref 30.0–36.0)
MCV: 87.3 fL (ref 80.0–100.0)
Platelets: 300 10*3/uL (ref 150–400)
RBC: 3.87 MIL/uL (ref 3.87–5.11)
RDW: 13.6 % (ref 11.5–15.5)
WBC: 8.1 10*3/uL (ref 4.0–10.5)
nRBC: 0 % (ref 0.0–0.2)

## 2021-02-06 LAB — BASIC METABOLIC PANEL
Anion gap: 9 (ref 5–15)
BUN: 28 mg/dL — ABNORMAL HIGH (ref 8–23)
CO2: 30 mmol/L (ref 22–32)
Calcium: 8.9 mg/dL (ref 8.9–10.3)
Chloride: 94 mmol/L — ABNORMAL LOW (ref 98–111)
Creatinine, Ser: 1.4 mg/dL — ABNORMAL HIGH (ref 0.44–1.00)
GFR, Estimated: 38 mL/min — ABNORMAL LOW (ref >60)
Glucose, Bld: 207 mg/dL — ABNORMAL HIGH (ref 70–99)
Potassium: 4.1 mmol/L (ref 3.5–5.1)
Sodium: 133 mmol/L — ABNORMAL LOW (ref 135–145)

## 2021-02-06 LAB — MAGNESIUM: Magnesium: 1.6 mg/dL — ABNORMAL LOW (ref 1.7–2.4)

## 2021-02-06 LAB — HEMOGLOBIN AND HEMATOCRIT, BLOOD
HCT: 37.4 % (ref 36.0–46.0)
Hemoglobin: 11.7 g/dL — ABNORMAL LOW (ref 12.0–15.0)

## 2021-02-06 LAB — PHOSPHORUS: Phosphorus: 4.3 mg/dL (ref 2.5–4.6)

## 2021-02-06 MED ORDER — ADULT MULTIVITAMIN W/MINERALS CH
1.0000 | ORAL_TABLET | Freq: Every day | ORAL | Status: DC
  Administered 2021-02-06 – 2021-02-07 (×2): 1 via ORAL
  Filled 2021-02-06 (×2): qty 1

## 2021-02-06 MED ORDER — SODIUM CHLORIDE 0.9 % IV BOLUS
500.0000 mL | Freq: Once | INTRAVENOUS | Status: AC
  Administered 2021-02-06: 500 mL via INTRAVENOUS

## 2021-02-06 MED ORDER — JUVEN PO PACK
1.0000 | PACK | Freq: Two times a day (BID) | ORAL | Status: DC
  Administered 2021-02-06 – 2021-02-07 (×2): 1 via ORAL
  Filled 2021-02-06 (×3): qty 1

## 2021-02-06 MED ORDER — PROSOURCE PLUS PO LIQD
30.0000 mL | Freq: Two times a day (BID) | ORAL | Status: DC
  Administered 2021-02-06 (×2): 30 mL via ORAL
  Filled 2021-02-06 (×2): qty 30

## 2021-02-06 MED ORDER — MAGNESIUM SULFATE 2 GM/50ML IV SOLN
2.0000 g | Freq: Once | INTRAVENOUS | Status: AC
  Administered 2021-02-06: 2 g via INTRAVENOUS
  Filled 2021-02-06: qty 50

## 2021-02-06 NOTE — Progress Notes (Signed)
Initial Nutrition Assessment  DOCUMENTATION CODES:   Obesity unspecified  INTERVENTION:  - will order 30 ml Prosource Plus BID, each supplement provides 100 kcal and 15 grams protein.  - will order 1 packet Juven BID, each packet provides 95 calories, 2.5 grams of protein (collagen), and 9.8 grams of carbohydrate (3 grams sugar); also contains 7 grams of L-arginine and L-glutamine, 300 mg vitamin C, 15 mg vitamin E, 1.2 mcg vitamin B-12, 9.5 mg zinc, 200 mg calcium, and 1.5 g  Calcium Beta-hydroxy-Beta-methylbutyrate to support wound healing - will order 1 tablet multivitamin with minerals/day. - referral for outpatient DM diet education.    NUTRITION DIAGNOSIS:   Increased nutrient needs related to acute illness, wound healing as evidenced by estimated needs.  GOAL:   Patient will meet greater than or equal to 90% of their needs  MONITOR:   PO intake, Supplement acceptance, Labs, Weight trends, Skin  REASON FOR ASSESSMENT:   Malnutrition Screening Tool  ASSESSMENT:   80 year old female with medical history of type 2 DM, HLD, HTN, neuropathy, and A. fib. She presented to the ED d/t L foot pain with throbbing sensation x2 days. In the ED, L foot xray was concerning for osteomyelitis. Podiatry consulted and reported no indication for surgery at this time and recommended IV abx x48 hours followed by PO doxycycline for 14 days and outpatient follow-up.  No intakes documented since admission. Patient laying in bed with no family or visitors at bedside. She reports that she was initially to go home today but BP has been low so MD told her she will stay one more night.  She lives with her husband in an apartment and they have several vegetable plants and herbs on their patio. Patient reports eating little to no meat and that she primarily eats vegetables (especially beans) and fruit. Began discussion about portion sizes and effect of these foods on blood sugar. She limits fat and opts for  the low-fat versions of items.   She denies chewing or swallowing difficulties but states she often will eat very small portions and feel full; can happen with as little as 2 bites.   Patient reports that she was close to 300 lb at one point and that since October 2021 she has lost, partly intentionally and partly unintentionally d/t early satiety, close to 50 lb. Weight on 6/8 was 204 lb. Weight on 05/29/20 was 214 lb. This indicates 10 lb weight loss (4.7% body weight); not significant for time frame.  Patient has hx of neuropathy and has pain any time she is on her feet.   She reports checking her CBG frequently--unable to elicit more detail on this. She reports a CBGs are usually ~100 mg/dl when she is more active and that often CBGs are in the 300 mg/dl range d/t inactivity.     Labs reviewed; HgbA1c on 02/04/21: > 15.5%, CBGs: 239 and 210 mg/dl, Na: 133 mmol/l, BUN: 28 mg/dl, creatinine: 1.4 mg/dl, Mg: 1.6 mg/dl, GFR: 38 ml/min. Medications reviewed; sliding scale novolog, 12 units lantus/day, 137 mcg oral synthroid/day, 2 g IV Mg sulfate x1 run 6/10, 40 mEq Klor-Con BID, 12.5 mg aldactone/day, 50000 units drisdol every 7 days starting 6/8.     NUTRITION - FOCUSED PHYSICAL EXAM:  Completed; no muscle or fat depletions; mild edema to BLE.  Diet Order:   Diet Order             Diet Carb Modified Fluid consistency: Thin; Room service appropriate? Yes  Diet effective now                   EDUCATION NEEDS:   Education needs have been addressed  Skin:  Skin Assessment: Reviewed RN Assessment  Last BM:  6/8  Height:   Ht Readings from Last 1 Encounters:  02/04/21 5\' 7"  (1.702 m)    Weight:   Wt Readings from Last 1 Encounters:  02/04/21 92.5 kg      Estimated Nutritional Needs:  Kcal:  1650-1850 kcal Protein:  80-90 grams Fluid:  >/= 1.7 L/day      Mary Matin, MS, RD, LDN, CNSC Inpatient Clinical Dietitian RD pager # available in [facility]  After  hours/weekend pager # available in [facility]

## 2021-02-06 NOTE — Progress Notes (Signed)
Inpatient Diabetes Program Recommendations  AACE/ADA: New Consensus Statement on Inpatient Glycemic Control (2015)  Target Ranges:  Prepandial:   less than 140 mg/dL      Peak postprandial:   less than 180 mg/dL (1-2 hours)      Critically ill patients:  140 - 180 mg/dL   Lab Results  Component Value Date   GLUCAP 239 (H) 02/06/2021   HGBA1C >15.5 (H) 02/04/2021    Review of Glycemic Control Results for Mary [name], Mary [name] (MRN [id number]) as of 02/06/2021 10:46  Ref. Range 02/05/2021 10:03 02/05/2021 12:02 02/05/2021 17:43 02/05/2021 21:46 02/06/2021 07:18  Glucose-Capillary Latest Ref Range: 70 - 99 mg/dL 329 (H) Novolog 15 units 302 (H) Novolog 15 units 90 303 (H) Novolog 4 units 239 (H) Novolog 7 units   Diabetes history: DM2 Outpatient Diabetes medications: Lantus sliding scale Current orders for Inpatient glycemic control: Lantus 12 units + Novolog correction scale 0-20 units tid + hs 0-5 units  Inpatient Diabetes Program Recommendations:   -Add Novolog 4 units tid meal coverage if eats 50% -Decrease Novolog correction to 0-9 units tid + hs 0-5 units Secure chat sent to Dr. Dwyane Dee.  Thank you, Nani Gasser. Daviyon Widmayer, RN, MSN, CDE  Diabetes Coordinator Inpatient Glycemic Control Team Team Pager [phone] (8am-5pm) 02/06/2021 10:45 AM

## 2021-02-06 NOTE — Progress Notes (Signed)
PROGRESS NOTE    Mary [name]  GQQ:[id number] DOB: 10/09/1940 DOA: 02/03/2021 PCP: Hoyt Koch, MD   Brief Narrative:  This 81 years old female with PMH significant for DM2, HLD, HTN, A. fib presented in the ED with left foot pain.  Patient reports having throbbing left foot pain for 2 days, she denies any injury but says she has neuropathy.  Patient reports her husband has looked at her foot and stated that she has significant swelling under the left toe associated with a blister.  She tried to get an appointment with podiatrist however there is no availability until July.  Her PCP has advised her to go to the ER.  She was found to be hyperglycemic in the ER and was given insulin and IV fluids, left foot x-ray was concerning for osteomyelitis.  Patient was started on IV vancomycin and cefepime.  Podiatry was consulted recommended there is no indication for surgery at this point, recommended IV antibiotic for 48 hours followed by doxycycline p.o. for 2 weeks and outpatient follow-up.   Assessment & Plan:   Active Problems:   Diabetic infection of left foot ([place])  Left great toe osteomyelitis: She has chronic nonhealing wound/ulcer under left great toe. Left foot x-ray concerning for osteomyelitis. Patient was started on antibiotics (IV vancomycin and cefepime). Podiatry was consulted states there is no indication for surgery at this point. Recommended IV antibiotic for 48 hours followed by doxycycline for 2 weeks and outpatient clinic follow-up in 1 week.  DM2 uncontrolled: Hemoglobin A1c 11.7 in 6/20 Continue Lantus 12 units and moderate sliding scale.  CKD stage IIIa: Serum creatinine at baseline.  A. fib on chronic anticoagulation. Continue Eliquis.  Heart rate controlled S/p pacemaker placement.  Hypokalemia: Improved  Hypothyroidism:  continue Synthroid  Hyperlipidemia :  continue home statin.  Hypotension: Will hold blood pressure medications, Normal saline  bolus, blood pressure slightly improved. Will continue to monitor.   DVT prophylaxis: Eliquis Code Status: Full code Family Communication: No family at bedside Disposition Plan:     Status is: Inpatient  Remains inpatient appropriate because:Inpatient level of care appropriate due to severity of illness  Dispo: The patient is from: Home              Anticipated d/c is to: Home              Patient currently is not medically stable to d/c.   Difficult to place patient No  Consultants:  Podiatry  Procedures:  None Antimicrobials:   Anti-infectives (From admission, onward)    Start     Dose/Rate Route Frequency Ordered Stop   02/04/21 1800  vancomycin (VANCOREADY) IVPB 750 mg/150 mL        750 mg 150 mL/hr over 60 Minutes Intravenous Every 24 hours 02/03/21 2123     02/04/21 0930  ceFEPIme (MAXIPIME) 2 g in sodium chloride 0.9 % 100 mL IVPB        2 g 200 mL/hr over 30 Minutes Intravenous Every 12 hours 02/04/21 0840     02/03/21 2130  vancomycin (VANCOCIN) IVPB 1000 mg/200 mL premix        1,000 mg 200 mL/hr over 60 Minutes Intravenous  Once 02/03/21 2123 02/03/21 2337   02/03/21 2115  piperacillin-tazobactam (ZOSYN) IVPB 3.375 g        3.375 g 100 mL/hr over 30 Minutes Intravenous  Once 02/03/21 2102 02/03/21 2233        Subjective: Patient was seen and examined  at bedside.  Overnight events noted.  She denies any pain.   She reports she has neuropathy.  RN reported blood pressure has been low, appears dry on exam.  Objective: Vitals:   02/05/21 0950 02/05/21 2144 02/06/21 0501 02/06/21 1149  BP: 103/64 (!) 131/113 (!) 83/55 105/75  Pulse:  74 74 73  Resp:  18 18 20   Temp:  97.6 F (36.4 C) 98.4 F (36.9 C) (!) 97.3 F (36.3 C)  TempSrc:  Oral Oral Oral  SpO2:  97% 97% 96%  Weight:      Height:        Intake/Output Summary (Last 24 hours) at 02/06/2021 1515 Last data filed at 02/06/2021 1417 Gross per 24 hour  Intake 480 ml  Output --  Net 480 ml     Filed Weights   02/03/21 1755 02/04/21 0611  Weight: 92.5 kg 92.5 kg    Examination:  General exam: Appears calm and comfortable, not in any acute distress. Respiratory system: Clear to auscultation. Respiratory effort normal. Cardiovascular system: S1 & S2 heard, RRR. No JVD, murmurs, rubs, gallops or clicks. No pedal edema. Gastrointestinal system: Abdomen is nondistended, soft and nontender. No organomegaly or masses felt.  Normal bowel sounds heard. Central nervous system: Alert and oriented. No focal neurological deficits. Extremities: Symmetric 5 x 5 power.  An open wound noted under left  great toe.   Mild erythema noted around the wound. Skin: No rashes, lesions or ulcers Psychiatry: Judgement and insight appear normal. Mood & affect appropriate.     Data Reviewed: I have personally reviewed following labs and imaging studies  CBC: Recent Labs  Lab 02/03/21 2016 02/04/21 0742 02/05/21 0350 02/06/21 0357  WBC 7.8 7.9 7.6 8.1  NEUTROABS 5.1 4.5  --   --   HGB 11.7* 11.4* 11.2* 10.8*  HCT 36.2 35.5* 34.9* 33.8*  MCV 86.0 86.8 87.0 87.3  PLT 329 302 305 801    Basic Metabolic Panel: Recent Labs  Lab 02/03/21 2016 02/04/21 0742 02/05/21 0350 02/06/21 0357  NA 128* 134* 133* 133*  K 4.0 3.4* 4.5 4.1  CL 90* 95* 99 94*  CO2 29 30 27 30   GLUCOSE 507* 176* 264* 207*  BUN 24* 24* 24* 28*  CREATININE 1.38* 1.31* 1.33* 1.40*  CALCIUM 8.5* 8.9 9.0 8.9  MG  --  1.8  --  1.6*  PHOS  --   --   --  4.3    GFR: Estimated Creatinine Clearance: 38.1 mL/min (A) (by C-G formula based on SCr of 1.4 mg/dL (H)). Liver Function Tests: Recent Labs  Lab 02/04/21 0742 02/05/21 0350  AST 12* 16  ALT 9 11  ALKPHOS 108 103  BILITOT 0.5 0.5  PROT 6.7 6.8  ALBUMIN 3.1* 3.2*    No results for input(s): LIPASE, AMYLASE in the last 168 hours. No results for input(s): AMMONIA in the last 168 hours. Coagulation Profile: No results for input(s): INR, PROTIME in the  last 168 hours. Cardiac Enzymes: No results for input(s): CKTOTAL, CKMB, CKMBINDEX, TROPONINI in the last 168 hours. BNP (last 3 results) Recent Labs    05/29/20 1608  PROBNP 1,027*    HbA1C: Recent Labs    02/04/21 0737  HGBA1C >15.5*    CBG: Recent Labs  Lab 02/05/21 1202 02/05/21 1743 02/05/21 2146 02/06/21 0718 02/06/21 1144  GLUCAP 302* 90 303* 239* 210*    Lipid Profile: No results for input(s): CHOL, HDL, LDLCALC, TRIG, CHOLHDL, LDLDIRECT in the last 72 hours.  Thyroid Function Tests: No results for input(s): TSH, T4TOTAL, FREET4, T3FREE, THYROIDAB in the last 72 hours. Anemia Panel: No results for input(s): VITAMINB12, FOLATE, FERRITIN, TIBC, IRON, RETICCTPCT in the last 72 hours. Sepsis Labs: Recent Labs  Lab 02/03/21 2125  LATICACIDVEN 1.3     Recent Results (from the past 240 hour(s))  Blood culture (routine x 2)     Status: None (Preliminary result)   Collection Time: 02/03/21  9:25 PM   Specimen: BLOOD RIGHT HAND  Result Value Ref Range Status   Specimen Description   Final    BLOOD RIGHT HAND Performed at [facility], [place] [address] [place], [place] [address]    Special Requests   Final    BOTTLES DRAWN AEROBIC AND ANAEROBIC Blood Culture adequate volume Performed at [facility], [address] [place], [place] [address]    Culture   Final    NO GROWTH 3 DAYS Performed at [facility], [place] [address], [place], [place] [address]    Report Status PENDING  Incomplete  SARS CORONAVIRUS 2 (TAT 6-24 HRS) Nasopharyngeal Peripheral     Status: None   Collection Time: 02/03/21  9:25 PM   Specimen: Peripheral; Nasopharyngeal  Result Value Ref Range Status   SARS Coronavirus 2 NEGATIVE NEGATIVE Final    Comment: (NOTE) SARS-CoV-2 target nucleic acids are NOT DETECTED.  The SARS-CoV-2 RNA is generally detectable in upper and lower respiratory specimens during the acute phase of infection. Negative results do not  preclude SARS-CoV-2 infection, do not rule out co-infections with other pathogens, and should not be used as the sole basis for treatment or other patient management decisions. Negative results must be combined with clinical observations, patient history, and epidemiological information. The expected result is Negative.  Fact Sheet for Patients: SugarRoll.be  Fact Sheet for Healthcare Providers: https://www.woods-mathews.com/  This test is not yet approved or cleared by the [place] FDA and  has been authorized for detection and/or diagnosis of SARS-CoV-2 by FDA under an Emergency Use Authorization (EUA). This EUA will remain  in effect (meaning this test can be used) for the duration of the COVID-19 declaration under Se ction 564(b)(1) of the Act, 21 U.S.C. section 360bbb-3(b)(1), unless the authorization is terminated or revoked sooner.  Performed at [facility], [place] [address] [place], [place] [address]   Blood culture (routine x 2)     Status: Abnormal (Preliminary result)   Collection Time: 02/03/21  9:29 PM   Specimen: BLOOD  Result Value Ref Range Status   Specimen Description   Final    BLOOD RIGHT ANTECUBITAL Performed at [facility], [address] [place],  [address]    Special Requests   Final    BOTTLES DRAWN AEROBIC AND ANAEROBIC Blood Culture adequate volume Performed at [facility], [address] [place], [place] [address]    Culture  Setup Time   Final    GRAM POSITIVE COCCI IN CLUSTERS IN BOTH AEROBIC AND ANAEROBIC BOTTLES CRITICAL RESULT CALLED TO, READ BACK BY AND VERIFIED WITH: PHARMD MICHELLE L [id number] FCP    Culture (A)  Final    STAPHYLOCOCCUS SAPROPHYTICUS THE SIGNIFICANCE OF ISOLATING THIS ORGANISM FROM A SINGLE SET OF BLOOD CULTURES WHEN MULTIPLE SETS ARE DRAWN IS UNCERTAIN. PLEASE NOTIFY THE MICROBIOLOGY DEPARTMENT WITHIN ONE WEEK IF SPECIATION AND  SENSITIVITIES ARE REQUIRED. Performed at [facility], [place] [address], [place], [place]  [address]    Report Status PENDING  Incomplete  Blood Culture ID Panel (Reflexed)     Status: Abnormal   Collection Time: 02/03/21  9:29 PM  Result Value Ref Range Status   Enterococcus faecalis NOT DETECTED NOT DETECTED Final   Enterococcus Faecium NOT DETECTED NOT DETECTED Final   Listeria monocytogenes NOT DETECTED NOT DETECTED Final   Staphylococcus species DETECTED (A) NOT DETECTED Final    Comment: CRITICAL RESULT CALLED TO, READ BACK BY AND VERIFIED WITH: PHARMD MICHELLE L [id number] FCP    Staphylococcus aureus (BCID) NOT DETECTED NOT DETECTED Final   Staphylococcus epidermidis NOT DETECTED NOT DETECTED Final   Staphylococcus lugdunensis NOT DETECTED NOT DETECTED Final   Streptococcus species NOT DETECTED NOT DETECTED Final   Streptococcus agalactiae NOT DETECTED NOT DETECTED Final   Streptococcus pneumoniae NOT DETECTED NOT DETECTED Final   Streptococcus pyogenes NOT DETECTED NOT DETECTED Final   A.calcoaceticus-baumannii NOT DETECTED NOT DETECTED Final   Bacteroides fragilis NOT DETECTED NOT DETECTED Final   Enterobacterales NOT DETECTED NOT DETECTED Final   Enterobacter cloacae complex NOT DETECTED NOT DETECTED Final   Escherichia coli NOT DETECTED NOT DETECTED Final   Klebsiella aerogenes NOT DETECTED NOT DETECTED Final   Klebsiella oxytoca NOT DETECTED NOT DETECTED Final   Klebsiella pneumoniae NOT DETECTED NOT DETECTED Final   Proteus species NOT DETECTED NOT DETECTED Final   Salmonella species NOT DETECTED NOT DETECTED Final   Serratia marcescens NOT DETECTED NOT DETECTED Final   Haemophilus influenzae NOT DETECTED NOT DETECTED Final   Neisseria meningitidis NOT DETECTED NOT DETECTED Final   Pseudomonas aeruginosa NOT DETECTED NOT DETECTED Final   Stenotrophomonas maltophilia NOT DETECTED NOT DETECTED Final   Candida albicans NOT DETECTED NOT DETECTED Final   Candida  auris NOT DETECTED NOT DETECTED Final   Candida glabrata NOT DETECTED NOT DETECTED Final   Candida krusei NOT DETECTED NOT DETECTED Final   Candida parapsilosis NOT DETECTED NOT DETECTED Final   Candida tropicalis NOT DETECTED NOT DETECTED Final   Cryptococcus neoformans/gattii NOT DETECTED NOT DETECTED Final    Comment: Performed at [facility], [address] [place],  [address]  Resp Panel by RT-PCR (Flu A&B, Covid) Nasopharyngeal Swab     Status: None   Collection Time: 02/04/21  1:40 AM   Specimen: Nasopharyngeal Swab; Nasopharyngeal(NP) swabs in vial transport medium  Result Value Ref Range Status   SARS Coronavirus 2 by RT PCR NEGATIVE NEGATIVE Final    Comment: (NOTE) SARS-CoV-2 target nucleic acids are NOT DETECTED.  The SARS-CoV-2 RNA is generally detectable in upper respiratory specimens during the acute phase of infection. The lowest concentration of SARS-CoV-2 viral copies this assay can detect is 138 copies/mL. A negative result does not preclude SARS-Cov-2 infection and should not be used as the sole basis for treatment or other patient management decisions. A negative result may occur with  improper specimen collection/handling, submission of specimen other than nasopharyngeal swab, presence of viral mutation(s) within the areas targeted by this assay, and inadequate number of viral copies(<138 copies/mL). A negative result must be combined with clinical observations, patient history, and epidemiological information. The expected result is Negative.  Fact Sheet for Patients:  EntrepreneurPulse.com.au  Fact Sheet for Healthcare Providers:  IncredibleEmployment.be  This test is no t yet approved or cleared by the [place] FDA and  has been authorized for detection and/or diagnosis of SARS-CoV-2 by FDA under an Emergency Use Authorization (EUA). This EUA will remain  in effect (  meaning this test can be used) for  the duration of the COVID-19 declaration under Section 564(b)(1) of the Act, 21 U.S.C.section 360bbb-3(b)(1), unless the authorization is terminated  or revoked sooner.       Influenza A by PCR NEGATIVE NEGATIVE Final   Influenza B by PCR NEGATIVE NEGATIVE Final    Comment: (NOTE) The Xpert Xpress SARS-CoV-2/FLU/RSV plus assay is intended as an aid in the diagnosis of influenza from Nasopharyngeal swab specimens and should not be used as a sole basis for treatment. Nasal washings and aspirates are unacceptable for Xpert Xpress SARS-CoV-2/FLU/RSV testing.  Fact Sheet for Patients: EntrepreneurPulse.com.au  Fact Sheet for Healthcare Providers: IncredibleEmployment.be  This test is not yet approved or cleared by the [place] FDA and has been authorized for detection and/or diagnosis of SARS-CoV-2 by FDA under an Emergency Use Authorization (EUA). This EUA will remain in effect (meaning this test can be used) for the duration of the COVID-19 declaration under Section 564(b)(1) of the Act, 21 U.S.C. section 360bbb-3(b)(1), unless the authorization is terminated or revoked.  Performed at [facility], [address], [place], [place] [address]      Radiology Studies: No results found.   Scheduled Meds:  (feeding supplement) PROSource Plus  30 mL Oral BID BM   apixaban  5 mg Oral BID   atorvastatin  80 mg Oral Daily   buPROPion  150 mg Oral Daily   insulin aspart  0-20 Units Subcutaneous TID WC   insulin aspart  0-5 Units Subcutaneous QHS   insulin glargine  12 Units Subcutaneous Daily   latanoprost  1 drop Right Eye QHS   levothyroxine  137 mcg Oral Q0600   metoprolol succinate  12.5 mg Oral QHS   multivitamin with minerals  1 tablet Oral Daily   nutrition supplement (JUVEN)  1 packet Oral BID BM   potassium chloride SA  40 mEq Oral BID   spironolactone  12.5 mg Oral Daily   torsemide  20 mg Oral BID   Vitamin D  (Ergocalciferol)  50,000 Units Oral Q7 days   Continuous Infusions:  sodium chloride Stopped (02/04/21 0318)   ceFEPime (MAXIPIME) IV 2 g (02/06/21 1014)   magnesium sulfate bolus IVPB     vancomycin 750 mg (02/05/21 1837)     LOS: 2 days    Time spent: 25 mins    Arpita Fentress, MD [facility]   If 7PM-7AM, please contact night-coverage

## 2021-02-06 NOTE — Care Management Important Message (Signed)
Important Message  Patient Details IM Letter given to the Patient. Name: Mary [name] MRN: [id number] Date of Birth: 10/13/1940   Medicare Important Message Given:  Yes     Kerin Salen 02/06/2021, 11:47 AM

## 2021-02-07 LAB — CULTURE, BLOOD (ROUTINE X 2): Special Requests: ADEQUATE

## 2021-02-07 LAB — BASIC METABOLIC PANEL
Anion gap: 9 (ref 5–15)
BUN: 31 mg/dL — ABNORMAL HIGH (ref 8–23)
CO2: 28 mmol/L (ref 22–32)
Calcium: 8.9 mg/dL (ref 8.9–10.3)
Chloride: 94 mmol/L — ABNORMAL LOW (ref 98–111)
Creatinine, Ser: 1.29 mg/dL — ABNORMAL HIGH (ref 0.44–1.00)
GFR, Estimated: 42 mL/min — ABNORMAL LOW (ref >60)
Glucose, Bld: 189 mg/dL — ABNORMAL HIGH (ref 70–99)
Potassium: 4.6 mmol/L (ref 3.5–5.1)
Sodium: 131 mmol/L — ABNORMAL LOW (ref 135–145)

## 2021-02-07 LAB — MAGNESIUM: Magnesium: 2.2 mg/dL (ref 1.7–2.4)

## 2021-02-07 LAB — GLUCOSE, CAPILLARY: Glucose-Capillary: 183 mg/dL — ABNORMAL HIGH (ref 70–99)

## 2021-02-07 LAB — PHOSPHORUS: Phosphorus: 4 mg/dL (ref 2.5–4.6)

## 2021-02-07 MED ORDER — DOXYCYCLINE MONOHYDRATE 100 MG PO TABS: 100.0000 mg | ORAL_TABLET | Freq: Two times a day (BID) | ORAL | 0 refills | Status: AC

## 2021-02-07 NOTE — Discharge Instructions (Signed)
Advised to take doxycycline 100 mg twice daily for 14 days. Advised to follow-up with [name] in 1 week.   Advised weightbearing as tolerated.

## 2021-02-07 NOTE — Discharge Summary (Addendum)
Physician Discharge Summary  Mary [name] IDP:[id number] DOB: 1940-11-01 DOA: 02/03/2021  PCP: Mary Koch, MD  Admit date: 02/03/2021  Discharge date: 02/07/2021  Admitted From: Home.  Disposition:   Home.  Recommendations for Outpatient Follow-up:  Follow up with PCP in 1-2 weeks. Please obtain BMP/CBC in one week. Advised to take doxycycline 100 mg twice daily for 14 days. 4.   Advised to follow-up with Dr. Boneta [name] in 1 week.   5.   Advised weightbearing as tolerated.  Home Health: None Equipment/Devices:None  Discharge Condition: Stable CODE STATUS:Full code Diet recommendation: Heart Healthy   Brief [facility] Course: This 80 years old female with PMH significant for DM2, HLD, HTN, A. fibrillation presented in the ED with left foot pain.  Patient reports having throbbing left foot pain for 2 days, she denies any injury but says she has neuropathy.  Patient reports her husband has looked at her foot and stated that she has significant swelling under the left toe associated with a blister.  She tried to get an appointment with podiatrist however there is no availability until July.  Her PCP has advised her to go to the ER.  She was found to be hyperglycemic in the ER and was given insulin and IV fluids, left foot x-ray was concerning for osteomyelitis.  Patient was started on IV vancomycin and cefepime.  Patient was admitted for suspected osteomyelitis.  Podiatry was consulted, recommended there is no indication for surgery at this point, recommended IV antibiotics for 48 hours followed by doxycycline p.o. for 2 weeks and outpatient follow-up.  Patient was continued on IV antibiotics, she was advised weightbearing as tolerated.  Patient feels better and want to be discharged home.  Patient is discharged home on doxycycline twice a day for 14 days.  She has appointment with Dr. Boneta [name] in 1 week.  She was managed for below problems during hospitalization.     Discharge Diagnoses:  Active Problems:   Diabetic infection of left foot ([place])  Left great toe osteomyelitis: She has chronic nonhealing wound/ulcer under left great toe. Left foot x-ray concerning for osteomyelitis. Patient was started on antibiotics (IV vancomycin and cefepime). Podiatry was consulted states there is no indication for surgery at this point. Recommended IV antibiotic for 48 hours followed by doxycycline for 2 weeks and outpatient clinic follow-up in 1 week.   DM2 uncontrolled: Hemoglobin A1c 11.7 in 6/20 Continue Lantus 12 units and moderate sliding scale.   CKD stage IIIa: Serum creatinine at baseline.   A. fib on chronic anticoagulation. Continue Eliquis.  Heart rate controlled S/p pacemaker placement.   Hypokalemia: Improved   Hypothyroidism:  continue Synthroid   Hyperlipidemia : continue home statin.   Hypotension: Normal saline bolus, blood pressure slightly improved. Resumed Home BP medications  Discharge Instructions  Discharge Instructions     Amb Referral to Nutrition and Diabetic Education   Complete by: As directed    Call MD for:  difficulty breathing, headache or visual disturbances   Complete by: As directed    Call MD for:  persistant nausea and vomiting   Complete by: As directed    Call MD for:  redness, tenderness, or signs of infection (pain, swelling, redness, odor or green/yellow discharge around incision site)   Complete by: As directed    Call MD for:  severe uncontrolled pain   Complete by: As directed    Diet - low sodium heart healthy   Complete by: As directed  Diet Carb Modified   Complete by: As directed    Discharge instructions   Complete by: As directed    Advised to take doxycycline twice a day for 14 days. Advised weightbearing as tolerated. Advised to follow-up with Dr. Boneta [name] in 1 week.   Increase activity slowly   Complete by: As directed       Allergies as of 02/07/2021       Reactions    Other Other (See Comments)   NO Blind Scopes with Naso Gastric tube.  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   Latex    Tape= burns   Nsaids Other (See Comments)   S/P gastric bypass-told not to take. GI bleeds with NSAIDS        Medication List     TAKE these medications    acetaminophen 325 MG tablet Commonly known as: TYLENOL Take 650 mg by mouth every 6 (six) hours as needed for mild pain, fever or headache.   atorvastatin 80 MG tablet Commonly known as: LIPITOR TAKE 1 TABLET(80 MG) BY MOUTH DAILY What changed: See the new instructions.   bimatoprost 0.01 % Soln Commonly known as: LUMIGAN Place 1 drop into the right eye at bedtime.   buPROPion 150 MG 24 hr tablet Commonly known as: WELLBUTRIN XL TAKE 1 TABLET(150 MG) BY MOUTH DAILY What changed:  how much to take how to take this when to take this additional instructions   doxycycline 100 MG tablet Commonly known as: ADOXA Take 1 tablet (100 mg total) by mouth 2 (two) times daily for 14 days.   Eliquis 5 MG Tabs tablet Generic drug: apixaban TAKE 1 TABLET BY MOUTH TWICE A DAY What changed: how much to take   HYDROcodone-acetaminophen 5-325 MG tablet Commonly known as: NORCO/VICODIN Take 1 tablet by mouth every 4 (four) hours as needed for moderate pain.   insulin glargine 100 UNIT/ML injection Commonly known as: LANTUS Inject into the skin daily. Sliding Scale   levothyroxine 137 MCG tablet Commonly known as: SYNTHROID Take 137 mcg by mouth every morning.   metoprolol succinate 25 MG 24 hr tablet Commonly known as: TOPROL-XL TAKE 1/2 TABLETS AT BEDTIME What changed:  how much to take how to take this when to take this additional  instructions   nitroGLYCERIN 0.4 MG SL tablet Commonly known as: NITROSTAT Place 1 tablet (0.4 mg total) under the tongue every 5 (five) minutes as needed for chest pain.   Potassium Chloride ER 20 MEQ Tbcr TAKE 2 TABLETS BY MOUTH TWICE A DAY What changed:  how much to take when to take this   spironolactone 25 MG tablet Commonly known as: ALDACTONE Take 0.5 tablets (12.5 mg total) by mouth daily.   torsemide 20 MG tablet Commonly known as: DEMADEX Take 20 mg by mouth 2 (two) times daily.   Vitamin D (Ergocalciferol) 1.25 MG (50000 UNIT) Caps capsule Commonly known as: DRISDOL Take 50,000 Units by mouth every 7 (seven) days.        Follow-up Information     Mary Koch, MD Follow up in 1 week(s).   Specialty: Internal Medicine Contact information: [place] [address] [phone]  Felipa Furnace, DPM Follow up in 1 week(s).   Specialty: Podiatry Contact information: [address] [address] [phone]                Allergies  Allergen Reactions   Other Other (See Comments)    NO Blind Scopes with Naso Gastric tube.  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   Latex     Tape= burns   Nsaids Other (See Comments)    S/P gastric bypass-told not to take. GI bleeds with NSAIDS    Consultations: Podiatry   Procedures/Studies: DG Foot Complete Left  Result Date: 02/03/2021 CLINICAL DATA:  Left foot wound under the first MTP joint with erythema and swelling EXAM: LEFT FOOT - COMPLETE 3+ VIEW COMPARISON:  12/12/2020 left foot radiographs FINDINGS: Generalized left foot soft tissue swelling. Vascular calcifications throughout the soft tissues.  No fracture or dislocation. Cortical erosion at plantar base of the proximal phalanx in the left first toe. Mild first MTP joint osteoarthritis. No radiopaque foreign bodies. IMPRESSION: 1. Generalized left foot soft tissue swelling. Cortical erosion at the plantar base of the proximal phalanx in the left first toe, worrisome for acute osteomyelitis. Consider MRI of the left foot without IV contrast as clinically warranted. 2. Mild first MTP joint osteoarthritis. Electronically Signed   By: Ilona Sorrel M.D.   On: 02/03/2021 20:29        Subjective: Patient is seen and examined at bedside.  Overnight events noted.  Patient reports feeling better. Patient want to be discharged home. Patient is being discharged home on doxycycline for 2 weeks.  Discharge Exam: Vitals:   02/06/21 2016 02/07/21 0433  BP: (!) 129/54 (!) 125/57  Pulse: 71 74  Resp: 19 20  Temp: 97.6 F (36.4 C) 97.6 F (36.4 C)  SpO2: 97% 99%   Vitals:   02/06/21 1149 02/06/21 1704 02/06/21 2016 02/07/21 0433  BP: 105/75 135/76 (!) 129/54 (!) 125/57  Pulse: 73 71 71 74  Resp: 20  19 20   Temp: (!) 97.3 F (36.3 C)  97.6 F (36.4 C) 97.6 F (36.4 C)  TempSrc: Oral  Oral Oral  SpO2: 96%  97% 99%  Weight:      Height:        General: Pt is alert, awake, not in acute distress Cardiovascular: RRR, S1/S2 +, no rubs, no gallops Respiratory: CTA bilaterally, no wheezing, no rhonchi Abdominal: Soft, NT, ND, bowel sounds + Extremities: no edema, no cyanosis    The results of significant diagnostics from this hospitalization (including imaging, microbiology, ancillary and laboratory) are listed below for reference.     Microbiology: Recent Results (from the past 240 hour(s))  Blood culture (routine x 2)     Status: None (Preliminary result)   Collection Time: 02/03/21  9:25 PM   Specimen: BLOOD RIGHT HAND  Result Value Ref Range Status   Specimen Description   Final    BLOOD RIGHT HAND Performed at [facility], [place] [address], [place], [place] [address]    Special Requests   Final    BOTTLES DRAWN AEROBIC AND ANAEROBIC Blood Culture adequate volume Performed at [facility], [address] [place], [place] [address]    Culture   Final    NO GROWTH 4 DAYS Performed at [facility], [place] [address], [place], [place] [address]    Report Status PENDING  Incomplete  SARS CORONAVIRUS 2 (TAT 6-24 HRS) Nasopharyngeal Peripheral     Status: None   Collection Time: 02/03/21  9:25 PM   Specimen: Peripheral; Nasopharyngeal  Result Value Ref Range Status   SARS Coronavirus 2 NEGATIVE NEGATIVE Final    Comment: (NOTE) SARS-CoV-2 target nucleic acids are NOT DETECTED.  The SARS-CoV-2 RNA is generally detectable in upper and lower respiratory specimens during the acute phase of infection. Negative results do not preclude SARS-CoV-2 infection, do not rule out co-infections with other pathogens, and should not be used as the sole basis for treatment or other patient management decisions. Negative results must be combined with clinical observations, patient history, and epidemiological information. The expected result is Negative.  Fact Sheet for Patients: SugarRoll.be  Fact Sheet for Healthcare Providers: https://www.woods-mathews.com/  This test is not yet approved or cleared by the [place] FDA and  has been authorized for detection and/or diagnosis of SARS-CoV-2 by FDA under an Emergency Use Authorization (EUA). This EUA will remain  in effect (meaning this test can be used) for the duration of the COVID-19 declaration under Se ction 564(b)(1) of the Act, 21 U.S.C. section 360bbb-3(b)(1), unless the authorization is terminated or revoked sooner.  Performed at [facility], [place] [address] [place], [place] [address]   Blood culture (routine x 2)     Status: Abnormal   Collection Time: 02/03/21  9:29 PM   Specimen:  BLOOD  Result Value Ref Range Status   Specimen Description   Final    BLOOD RIGHT ANTECUBITAL Performed at [facility], [address] [place], [place] [address]    Special Requests   Final    BOTTLES DRAWN AEROBIC AND ANAEROBIC Blood Culture adequate volume Performed at [facility], [address] [place], [place] [address]    Culture  Setup Time   Final    GRAM POSITIVE COCCI IN CLUSTERS IN BOTH AEROBIC AND ANAEROBIC BOTTLES CRITICAL RESULT CALLED TO, READ BACK BY AND VERIFIED WITH: PHARMD MICHELLE L [id number] FCP    Culture (A)  Final    STAPHYLOCOCCUS SAPROPHYTICUS THE SIGNIFICANCE OF ISOLATING THIS ORGANISM FROM A SINGLE SET OF BLOOD CULTURES WHEN MULTIPLE SETS ARE DRAWN IS UNCERTAIN. PLEASE NOTIFY THE MICROBIOLOGY DEPARTMENT WITHIN ONE WEEK IF SPECIATION AND SENSITIVITIES ARE REQUIRED. Performed at [facility], [place] [address] [place], [place] [address]    Report Status 02/07/2021 FINAL  Final  Blood Culture ID Panel (Reflexed)     Status: Abnormal   Collection Time: 02/03/21  9:29 PM  Result Value Ref Range Status   Enterococcus faecalis NOT DETECTED NOT DETECTED Final   Enterococcus Faecium NOT DETECTED NOT DETECTED Final   Listeria monocytogenes NOT DETECTED NOT DETECTED Final   Staphylococcus species DETECTED (A) NOT DETECTED Final    Comment: CRITICAL RESULT CALLED TO, READ BACK BY AND VERIFIED WITH: PHARMD MICHELLE L [id number] FCP    Staphylococcus aureus (BCID) NOT DETECTED NOT DETECTED Final   Staphylococcus epidermidis NOT DETECTED NOT DETECTED Final   Staphylococcus lugdunensis NOT DETECTED NOT DETECTED Final   Streptococcus species NOT DETECTED NOT DETECTED Final   Streptococcus agalactiae NOT DETECTED NOT DETECTED Final   Streptococcus  pneumoniae NOT DETECTED NOT DETECTED Final   Streptococcus pyogenes NOT DETECTED NOT DETECTED Final   A.calcoaceticus-baumannii NOT DETECTED NOT DETECTED Final   Bacteroides fragilis NOT  DETECTED NOT DETECTED Final   Enterobacterales NOT DETECTED NOT DETECTED Final   Enterobacter cloacae complex NOT DETECTED NOT DETECTED Final   Escherichia coli NOT DETECTED NOT DETECTED Final   Klebsiella aerogenes NOT DETECTED NOT DETECTED Final   Klebsiella oxytoca NOT DETECTED NOT DETECTED Final   Klebsiella pneumoniae NOT DETECTED NOT DETECTED Final   Proteus species NOT DETECTED NOT DETECTED Final   Salmonella species NOT DETECTED NOT DETECTED Final   Serratia marcescens NOT DETECTED NOT DETECTED Final   Haemophilus influenzae NOT DETECTED NOT DETECTED Final   Neisseria meningitidis NOT DETECTED NOT DETECTED Final   Pseudomonas aeruginosa NOT DETECTED NOT DETECTED Final   Stenotrophomonas maltophilia NOT DETECTED NOT DETECTED Final   Candida albicans NOT DETECTED NOT DETECTED Final   Candida auris NOT DETECTED NOT DETECTED Final   Candida glabrata NOT DETECTED NOT DETECTED Final   Candida krusei NOT DETECTED NOT DETECTED Final   Candida parapsilosis NOT DETECTED NOT DETECTED Final   Candida tropicalis NOT DETECTED NOT DETECTED Final   Cryptococcus neoformans/gattii NOT DETECTED NOT DETECTED Final    Comment: Performed at [facility], [address], [place], [place] [address]  Resp Panel by RT-PCR (Flu A&B, Covid) Nasopharyngeal Swab     Status: None   Collection Time: 02/04/21  1:40 AM   Specimen: Nasopharyngeal Swab; Nasopharyngeal(NP) swabs in vial transport medium  Result Value Ref Range Status   SARS Coronavirus 2 by RT PCR NEGATIVE NEGATIVE Final    Comment: (NOTE) SARS-CoV-2 target nucleic acids are NOT DETECTED.  The SARS-CoV-2 RNA is generally detectable in upper respiratory specimens during the acute phase of infection. The lowest concentration of SARS-CoV-2 viral copies this assay can detect is 138 copies/mL. A negative result does not preclude SARS-Cov-2 infection and should not be used as the sole basis for treatment or other patient management decisions.  A negative result may occur with  improper specimen collection/handling, submission of specimen other than nasopharyngeal swab, presence of viral mutation(s) within the areas targeted by this assay, and inadequate number of viral copies(<138 copies/mL). A negative result must be combined with clinical observations, patient history, and epidemiological information. The expected result is Negative.  Fact Sheet for Patients:  EntrepreneurPulse.com.au  Fact Sheet for Healthcare Providers:  IncredibleEmployment.be  This test is no t yet approved or cleared by the [place] FDA and  has been authorized for detection and/or diagnosis of SARS-CoV-2 by FDA under an Emergency Use Authorization (EUA). This EUA will remain  in effect (meaning this test can be used) for the duration of the COVID-19 declaration under Section 564(b)(1) of the Act, 21 U.S.C.section 360bbb-3(b)(1), unless the authorization is terminated  or revoked sooner.       Influenza A by PCR NEGATIVE NEGATIVE Final   Influenza B by PCR NEGATIVE NEGATIVE Final    Comment: (NOTE) The Xpert Xpress SARS-CoV-2/FLU/RSV plus assay is intended as an aid in the diagnosis of influenza from Nasopharyngeal swab specimens and should not be used as a sole basis for treatment. Nasal washings and aspirates are unacceptable for Xpert Xpress SARS-CoV-2/FLU/RSV testing.  Fact Sheet for Patients: EntrepreneurPulse.com.au  Fact Sheet for Healthcare Providers: IncredibleEmployment.be  This test is not yet approved or cleared by the [place] FDA and has been authorized for detection and/or diagnosis of SARS-CoV-2 by FDA under  an Emergency Use Authorization (EUA). This EUA will remain in effect (meaning this test can be used) for the duration of the COVID-19 declaration under Section 564(b)(1) of the Act, 21 U.S.C. section 360bbb-3(b)(1), unless the authorization  is terminated or revoked.  Performed at [facility], [address] [place], [place] [address]      Labs: BNP (last 3 results) No results for input(s): BNP in the last 8760 hours. Basic Metabolic Panel: Recent Labs  Lab 02/03/21 2016 02/04/21 0742 02/05/21 0350 02/06/21 0357 02/07/21 0400  NA 128* 134* 133* 133* 131*  K 4.0 3.4* 4.5 4.1 4.6  CL 90* 95* 99 94* 94*  CO2 29 30 27 30 28   GLUCOSE 507* 176* 264* 207* 189*  BUN 24* 24* 24* 28* 31*  CREATININE 1.38* 1.31* 1.33* 1.40* 1.29*  CALCIUM 8.5* 8.9 9.0 8.9 8.9  MG  --  1.8  --  1.6* 2.2  PHOS  --   --   --  4.3 4.0   Liver Function Tests: Recent Labs  Lab 02/04/21 0742 02/05/21 0350  AST 12* 16  ALT 9 11  ALKPHOS 108 103  BILITOT 0.5 0.5  PROT 6.7 6.8  ALBUMIN 3.1* 3.2*   No results for input(s): LIPASE, AMYLASE in the last 168 hours. No results for input(s): AMMONIA in the last 168 hours. CBC: Recent Labs  Lab 02/03/21 2016 02/04/21 0742 02/05/21 0350 02/06/21 0357 02/06/21 1544  WBC 7.8 7.9 7.6 8.1  --   NEUTROABS 5.1 4.5  --   --   --   HGB 11.7* 11.4* 11.2* 10.8* 11.7*  HCT 36.2 35.5* 34.9* 33.8* 37.4  MCV 86.0 86.8 87.0 87.3  --   PLT 329 302 305 300  --    Cardiac Enzymes: No results for input(s): CKTOTAL, CKMB, CKMBINDEX, TROPONINI in the last 168 hours. BNP: Invalid input(s): POCBNP CBG: Recent Labs  Lab 02/06/21 0718 02/06/21 1144 02/06/21 1612 02/06/21 2218 02/07/21 0748  GLUCAP 239* 210* 170* 155* 183*   D-Dimer No results for input(s): DDIMER in the last 72 hours. Hgb A1c No results for input(s): HGBA1C in the last 72 hours. Lipid Profile No results for input(s): CHOL, HDL, LDLCALC, TRIG, CHOLHDL, LDLDIRECT in the last 72 hours. Thyroid function studies No results for input(s): TSH, T4TOTAL, T3FREE, THYROIDAB in the last 72 hours.  Invalid input(s): FREET3 Anemia work up No results for input(s): VITAMINB12, FOLATE, FERRITIN, TIBC, IRON, RETICCTPCT in the  last 72 hours. Urinalysis    Component Value Date/Time   COLORURINE YELLOW 08/01/2016 [place] 08/01/2016 0539   LABSPEC 1.007 08/01/2016 0539   PHURINE 6.0 08/01/2016 0539   GLUCOSEU NEGATIVE 08/01/2016 0539   HGBUR NEGATIVE 08/01/2016 0539   BILIRUBINUR NEGATIVE 08/01/2016 0539   KETONESUR NEGATIVE 08/01/2016 0539   PROTEINUR NEGATIVE 08/01/2016 0539   NITRITE NEGATIVE 08/01/2016 0539   LEUKOCYTESUR MODERATE (A) 08/01/2016 0539   Sepsis Labs Invalid input(s): PROCALCITONIN,  WBC,  LACTICIDVEN Microbiology Recent Results (from the past 240 hour(s))  Blood culture (routine x 2)     Status: None (Preliminary result)   Collection Time: 02/03/21  9:25 PM   Specimen: BLOOD RIGHT HAND  Result Value Ref Range Status   Specimen Description   Final    BLOOD RIGHT HAND Performed at [facility], [place] [address], [place], [place] [address]    Special Requests   Final    BOTTLES DRAWN AEROBIC AND ANAEROBIC Blood Culture adequate volume  Performed at [facility], [address], [place], [place] [address]    Culture   Final    NO GROWTH 4 DAYS Performed at [facility], [place] [address] [place], [place] [address]    Report Status PENDING  Incomplete  SARS CORONAVIRUS 2 (TAT 6-24 HRS) Nasopharyngeal Peripheral     Status: None   Collection Time: 02/03/21  9:25 PM   Specimen: Peripheral; Nasopharyngeal  Result Value Ref Range Status   SARS Coronavirus 2 NEGATIVE NEGATIVE Final    Comment: (NOTE) SARS-CoV-2 target nucleic acids are NOT DETECTED.  The SARS-CoV-2 RNA is generally detectable in upper and lower respiratory specimens during the acute phase of infection. Negative results do not preclude SARS-CoV-2 infection, do not rule out co-infections with other pathogens, and should not be used as the sole basis for treatment or other patient management decisions. Negative results must be combined with clinical observations, patient history, and  epidemiological information. The expected result is Negative.  Fact Sheet for Patients: SugarRoll.be  Fact Sheet for Healthcare Providers: https://www.woods-mathews.com/  This test is not yet approved or cleared by the [place] FDA and  has been authorized for detection and/or diagnosis of SARS-CoV-2 by FDA under an Emergency Use Authorization (EUA). This EUA will remain  in effect (meaning this test can be used) for the duration of the COVID-19 declaration under Se ction 564(b)(1) of the Act, 21 U.S.C. section 360bbb-3(b)(1), unless the authorization is terminated or revoked sooner.  Performed at [facility], [place] [address] [place], [place] [address]   Blood culture (routine x 2)     Status: Abnormal   Collection Time: 02/03/21  9:29 PM   Specimen: BLOOD  Result Value Ref Range Status   Specimen Description   Final    BLOOD RIGHT ANTECUBITAL Performed at [facility], [address] [place], [place] [address]    Special Requests   Final    BOTTLES DRAWN AEROBIC AND ANAEROBIC Blood Culture adequate volume Performed at [facility], [address] [place], [place] [address]    Culture  Setup Time   Final    GRAM POSITIVE COCCI IN CLUSTERS IN BOTH AEROBIC AND ANAEROBIC BOTTLES CRITICAL RESULT CALLED TO, READ BACK BY AND VERIFIED WITH: PHARMD MICHELLE L [id number] FCP    Culture (A)  Final    STAPHYLOCOCCUS SAPROPHYTICUS THE SIGNIFICANCE OF ISOLATING THIS ORGANISM FROM A SINGLE SET OF BLOOD CULTURES WHEN MULTIPLE SETS ARE DRAWN IS UNCERTAIN. PLEASE NOTIFY THE MICROBIOLOGY DEPARTMENT WITHIN ONE WEEK IF SPECIATION AND SENSITIVITIES ARE REQUIRED. Performed at [facility], [place] [address] [place], [place] [address]    Report Status 02/07/2021 FINAL  Final  Blood Culture ID Panel (Reflexed)     Status: Abnormal   Collection Time: 02/03/21  9:29 PM  Result Value Ref Range Status   Enterococcus  faecalis NOT DETECTED NOT DETECTED Final   Enterococcus Faecium NOT DETECTED NOT DETECTED Final   Listeria monocytogenes NOT DETECTED NOT DETECTED Final   Staphylococcus species DETECTED (A) NOT DETECTED Final    Comment: CRITICAL RESULT CALLED TO, READ BACK BY AND VERIFIED WITH: PHARMD MICHELLE L [id number] FCP    Staphylococcus aureus (BCID) NOT DETECTED NOT DETECTED Final   Staphylococcus epidermidis NOT DETECTED NOT DETECTED Final   Staphylococcus lugdunensis NOT DETECTED NOT DETECTED Final   Streptococcus species NOT DETECTED NOT DETECTED Final   Streptococcus agalactiae NOT DETECTED NOT DETECTED  Final   Streptococcus pneumoniae NOT DETECTED NOT DETECTED Final   Streptococcus pyogenes NOT DETECTED NOT DETECTED Final   A.calcoaceticus-baumannii NOT DETECTED NOT DETECTED Final   Bacteroides fragilis NOT DETECTED NOT DETECTED Final   Enterobacterales NOT DETECTED NOT DETECTED Final   Enterobacter cloacae complex NOT DETECTED NOT DETECTED Final   Escherichia coli NOT DETECTED NOT DETECTED Final   Klebsiella aerogenes NOT DETECTED NOT DETECTED Final   Klebsiella oxytoca NOT DETECTED NOT DETECTED Final   Klebsiella pneumoniae NOT DETECTED NOT DETECTED Final   Proteus species NOT DETECTED NOT DETECTED Final   Salmonella species NOT DETECTED NOT DETECTED Final   Serratia marcescens NOT DETECTED NOT DETECTED Final   Haemophilus influenzae NOT DETECTED NOT DETECTED Final   Neisseria meningitidis NOT DETECTED NOT DETECTED Final   Pseudomonas aeruginosa NOT DETECTED NOT DETECTED Final   Stenotrophomonas maltophilia NOT DETECTED NOT DETECTED Final   Candida albicans NOT DETECTED NOT DETECTED Final   Candida auris NOT DETECTED NOT DETECTED Final   Candida glabrata NOT DETECTED NOT DETECTED Final   Candida krusei NOT DETECTED NOT DETECTED Final   Candida parapsilosis NOT DETECTED NOT DETECTED Final   Candida tropicalis NOT DETECTED NOT DETECTED Final   Cryptococcus neoformans/gattii NOT  DETECTED NOT DETECTED Final    Comment: Performed at [facility], [address], [place], [place] [address]  Resp Panel by RT-PCR (Flu A&B, Covid) Nasopharyngeal Swab     Status: None   Collection Time: 02/04/21  1:40 AM   Specimen: Nasopharyngeal Swab; Nasopharyngeal(NP) swabs in vial transport medium  Result Value Ref Range Status   SARS Coronavirus 2 by RT PCR NEGATIVE NEGATIVE Final    Comment: (NOTE) SARS-CoV-2 target nucleic acids are NOT DETECTED.  The SARS-CoV-2 RNA is generally detectable in upper respiratory specimens during the acute phase of infection. The lowest concentration of SARS-CoV-2 viral copies this assay can detect is 138 copies/mL. A negative result does not preclude SARS-Cov-2 infection and should not be used as the sole basis for treatment or other patient management decisions. A negative result may occur with  improper specimen collection/handling, submission of specimen other than nasopharyngeal swab, presence of viral mutation(s) within the areas targeted by this assay, and inadequate number of viral copies(<138 copies/mL). A negative result must be combined with clinical observations, patient history, and epidemiological information. The expected result is Negative.  Fact Sheet for Patients:  EntrepreneurPulse.com.au  Fact Sheet for Healthcare Providers:  IncredibleEmployment.be  This test is no t yet approved or cleared by the [place] FDA and  has been authorized for detection and/or diagnosis of SARS-CoV-2 by FDA under an Emergency Use Authorization (EUA). This EUA will remain  in effect (meaning this test can be used) for the duration of the COVID-19 declaration under Section 564(b)(1) of the Act, 21 U.S.C.section 360bbb-3(b)(1), unless the authorization is terminated  or revoked sooner.       Influenza A by PCR NEGATIVE NEGATIVE Final   Influenza B by PCR NEGATIVE NEGATIVE Final    Comment:  (NOTE) The Xpert Xpress SARS-CoV-2/FLU/RSV plus assay is intended as an aid in the diagnosis of influenza from Nasopharyngeal swab specimens and should not be used as a sole basis for treatment. Nasal washings and aspirates are unacceptable for Xpert Xpress SARS-CoV-2/FLU/RSV testing.  Fact Sheet for Patients: EntrepreneurPulse.com.au  Fact Sheet for Healthcare Providers: IncredibleEmployment.be  This test is not yet approved or cleared by the [place] FDA and has been authorized for detection and/or diagnosis of  SARS-CoV-2 by FDA under an Emergency Use Authorization (EUA). This EUA will remain in effect (meaning this test can be used) for the duration of the COVID-19 declaration under Section 564(b)(1) of the Act, 21 U.S.C. section 360bbb-3(b)(1), unless the authorization is terminated or revoked.  Performed at [facility], [address] [place], [place] [address]      Time coordinating discharge: Over 30 minutes  SIGNED:   Shawna Clamp, MD  [facility] 02/07/2021, 11:09 AM Pager   If 7PM-7AM, please contact night-coverage [email or web address]

## 2021-02-07 NOTE — Plan of Care (Signed)
Discharge education completed with pt. Pt to be discharged home with self care via personal vehicle. Pt verbalizes understanding and cooperation with discharge education at this time and has no further questions or concerns   Problem: Education: Goal: Knowledge of General Education information will improve Description: Including pain rating scale, medication(s)/side effects and non-pharmacologic comfort measures Outcome: Adequate for Discharge   Problem: Health Behavior/Discharge Planning: Goal: Ability to manage health-related needs will improve Outcome: Adequate for Discharge   Problem: Clinical Measurements: Goal: Ability to maintain clinical measurements within normal limits will improve Outcome: Adequate for Discharge Goal: Will remain free from infection Outcome: Adequate for Discharge Goal: Diagnostic test results will improve Outcome: Adequate for Discharge Goal: Respiratory complications will improve Outcome: Adequate for Discharge Goal: Cardiovascular complication will be avoided Outcome: Adequate for Discharge   Problem: Activity: Goal: Risk for activity intolerance will decrease Outcome: Adequate for Discharge   Problem: Nutrition: Goal: Adequate nutrition will be maintained Outcome: Adequate for Discharge   Problem: Coping: Goal: Level of anxiety will decrease Outcome: Adequate for Discharge   Problem: Elimination: Goal: Will not experience complications related to bowel motility Outcome: Adequate for Discharge Goal: Will not experience complications related to urinary retention Outcome: Adequate for Discharge   Problem: Pain Managment: Goal: General experience of comfort will improve Outcome: Adequate for Discharge   Problem: Safety: Goal: Ability to remain free from injury will improve Outcome: Adequate for Discharge   Problem: Skin Integrity: Goal: Risk for impaired skin integrity will decrease Outcome: Adequate for Discharge

## 2021-02-08 LAB — CULTURE, BLOOD (ROUTINE X 2)
Culture: NO GROWTH
Special Requests: ADEQUATE

## 2021-02-09 ENCOUNTER — Telehealth: Payer: Self-pay

## 2021-02-09 NOTE — Telephone Encounter (Signed)
Transition Care Management Unsuccessful Follow-up Telephone Call  Date of discharge and from where:  02/07/2021 from [facility]  Attempts:  1st Attempt  Reason for unsuccessful TCM follow-up call:  Left voice message

## 2021-02-10 ENCOUNTER — Encounter: Payer: Self-pay | Admitting: Internal Medicine

## 2021-02-10 ENCOUNTER — Telehealth: Payer: Self-pay

## 2021-02-10 NOTE — Telephone Encounter (Cosign Needed)
Transition Care Management Unsuccessful Follow-up Telephone Call  Date of discharge and from where:  02/07/2021 from [facility]  Attempts:  2nd Attempt  Reason for unsuccessful TCM follow-up call:  spoke with husband, wife is not able to ambulate or able to come in for hospital f/u.  She will be following up with [name] regarding diabetic foot  infection.

## 2021-02-10 NOTE — Telephone Encounter (Signed)
I would recommend to see if they can do virtual visit for TCM since they cannot get to office.

## 2021-02-11 NOTE — Telephone Encounter (Signed)
Pt has an OV 02/12/21.

## 2021-02-11 NOTE — Telephone Encounter (Signed)
I understand the billing concerns but my concern is that the patient needs a follow up so if she cannot get to office she still should be seen.

## 2021-02-11 NOTE — Telephone Encounter (Signed)
TCM are not covered if done via video (which I was advised by my leader).

## 2021-02-11 NOTE — Telephone Encounter (Signed)
I sent her a message to contact the office.

## 2021-02-12 ENCOUNTER — Ambulatory Visit: Payer: Medicare Other | Admitting: Internal Medicine

## 2021-02-12 ENCOUNTER — Other Ambulatory Visit: Payer: Self-pay

## 2021-02-12 DIAGNOSIS — E118 Type 2 diabetes mellitus with unspecified complications: Secondary | ICD-10-CM

## 2021-02-12 DIAGNOSIS — E11628 Type 2 diabetes mellitus with other skin complications: Secondary | ICD-10-CM

## 2021-02-12 DIAGNOSIS — B379 Candidiasis, unspecified: Secondary | ICD-10-CM

## 2021-02-12 DIAGNOSIS — T3695XA Adverse effect of unspecified systemic antibiotic, initial encounter: Secondary | ICD-10-CM | POA: Diagnosis not present

## 2021-02-12 DIAGNOSIS — L089 Local infection of the skin and subcutaneous tissue, unspecified: Secondary | ICD-10-CM

## 2021-02-12 MED ORDER — FLUCONAZOLE 150 MG PO TABS: 150.0000 mg | ORAL_TABLET | ORAL | 0 refills | Status: DC

## 2021-02-12 NOTE — Patient Instructions (Signed)
We have sent in the diflucan to take 1 pill every 3 days to clear up the infection.

## 2021-02-12 NOTE — Progress Notes (Signed)
   Subjective:   Patient ID: Mary [name], female    DOB: 05-02-1941, 80 y.o.   MRN: [id number]  HPI The patient is a 80 YO female coming in for hospital follow up (in for toe infection, given antibiotics, high sugars while in hospital, she has not had meter or insulin in a short time due to moving, she just found this). Now with painful blister on genitals and anus region. Hurting and painful to sit down. Denies fevers or chills. Foot is doing okay and following up with podiatry. Taking doxycycline still for another week.   PMH, [facility], social history reviewed and updated  Review of Systems  Constitutional:  Positive for activity change and fatigue.  HENT: Negative.    Eyes: Negative.   Respiratory:  Positive for shortness of breath. Negative for cough and chest tightness.   Cardiovascular:  Positive for leg swelling. Negative for chest pain and palpitations.  Gastrointestinal:  Positive for rectal pain. Negative for abdominal distention, abdominal pain, constipation, diarrhea, nausea and vomiting.  Genitourinary:  Positive for genital sores.  Musculoskeletal:  Positive for arthralgias, back pain and gait problem.  Skin: Negative.   Psychiatric/Behavioral: Negative.     Objective:  Physical Exam Constitutional:      Appearance: She is well-developed. She is obese.  HENT:     Head: Normocephalic and atraumatic.  Cardiovascular:     Rate and Rhythm: Normal rate and regular rhythm.  Pulmonary:     Effort: Pulmonary effort is normal. No respiratory distress.     Breath sounds: Normal breath sounds. No wheezing or rales.  Abdominal:     General: Bowel sounds are normal. There is no distension.     Palpations: Abdomen is soft.     Tenderness: There is no abdominal tenderness. There is no rebound.  Genitourinary:    Comments: Anal rash with white crusting consistent with yeast infection and on the labia external as well. No internal exam done.  Musculoskeletal:     Cervical back: Normal  range of motion.  Skin:    General: Skin is warm and dry.  Neurological:     Mental Status: She is alert and oriented to person, place, and time. Mental status is at baseline.     Coordination: Coordination abnormal.    Vitals:   02/12/21 1438  BP: 124/86  Pulse: 74  Temp: 98.9 F (37.2 C)  TempSrc: Oral  SpO2: 96%  Weight: 201 lb (91.2 kg)  Height: 5\' 7"  (1.702 m)    This visit occurred during the SARS-CoV-2 public health emergency.  Safety protocols were in place, including screening questions prior to the visit, additional usage of staff PPE, and extensive cleaning of exam room while observing appropriate contact time as indicated for disinfecting solutions.   Assessment & Plan:

## 2021-02-13 ENCOUNTER — Encounter: Payer: Self-pay | Admitting: Internal Medicine

## 2021-02-13 DIAGNOSIS — B379 Candidiasis, unspecified: Secondary | ICD-10-CM | POA: Insufficient documentation

## 2021-02-13 DIAGNOSIS — T3695XA Adverse effect of unspecified systemic antibiotic, initial encounter: Secondary | ICD-10-CM | POA: Insufficient documentation

## 2021-02-13 NOTE — Assessment & Plan Note (Signed)
Still taking doxycycline and seeing podiatry soon for monitoring. Not believed to be osteomyelitis.

## 2021-02-13 NOTE — Assessment & Plan Note (Signed)
Sugars high due to not finding lantus in move. She has it now and is getting back on track.

## 2021-02-13 NOTE — Assessment & Plan Note (Signed)
Likely due to high sugars from not taking insulin and antibiotics. Rx diflucan for 1 week supply while she is on antibiotic. Advised to get back on insulin and bring sugars down.

## 2021-02-18 ENCOUNTER — Ambulatory Visit: Payer: Medicare Other | Admitting: Podiatry

## 2021-02-18 ENCOUNTER — Other Ambulatory Visit: Payer: Self-pay

## 2021-02-18 DIAGNOSIS — E118 Type 2 diabetes mellitus with unspecified complications: Secondary | ICD-10-CM | POA: Diagnosis not present

## 2021-02-18 DIAGNOSIS — L97521 Non-pressure chronic ulcer of other part of left foot limited to breakdown of skin: Secondary | ICD-10-CM

## 2021-02-24 ENCOUNTER — Encounter: Payer: Self-pay | Admitting: Podiatry

## 2021-02-24 NOTE — Progress Notes (Signed)
Subjective:  Patient ID: Mary [name], female    DOB: [birth year],  MRN: [id number]  Chief Complaint  Patient presents with   Wound Check    Follow up from hospital, left foot wound check.     80 y.o. female presents with the above complaint.  Patient presents with a follow-up check from left submetatarsal 1 ulceration.  She states that she was seen by me in the hospital.  She states that the wound is doing really good she no longer has any wound.  They have been doing regular dressing changes.  She has not seen anyone else prior to see me.  She has been offloading the first MPJ side with a shoe.  She denies any other acute complaints.   Review of Systems: Negative except as noted in the HPI. Denies N/V/F/Ch.  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 Procedure complicated by an atrial lead dislodgment and repeat procedure, 12/2013    Carotid stenosis    Carotid US 5/16:  Bilateral ICA 1-39%; L vertebral retrograde; L BP 126/49, R BP 140/57   Chronic combined systolic and diastolic CHF (congestive heart failure) (HCC)    Chronic lower back pain    Colon cancer ([place])    a. s/p chemo   Colon polyps    Diabetic peripheral neuropathy ([place])    DVT (deep venous thrombosis) ([place]) 2013   "twice behind knee on left side" (06/25/2013)   Glaucoma    History of chicken pox    Hyperlipidemia    Hypertension    Hypothyroidism    Iron deficiency anemia    Multinodular goiter    Osteoporosis    PAF (paroxysmal atrial fibrillation) (HCC)    Sleep apnea    a. resolved post weight loss    Type II diabetes mellitus (HCC)     Current Outpatient Medications:    acetaminophen (TYLENOL) 325 MG tablet, Take 650 mg by mouth every 6 (six) hours as needed for mild pain, fever or headache., Disp: , Rfl:     atorvastatin (LIPITOR) 80 MG tablet, TAKE 1 TABLET(80 MG) BY MOUTH DAILY, Disp: 90 tablet, Rfl: 0   bimatoprost (LUMIGAN) 0.01 % SOLN, Place 1 drop into the right eye at bedtime. , Disp: , Rfl:    buPROPion (WELLBUTRIN XL) 150 MG 24 hr tablet, TAKE 1 TABLET(150 MG) BY MOUTH DAILY, Disp: 90 tablet, Rfl: 1   ELIQUIS 5 MG TABS tablet, TAKE 1 TABLET BY MOUTH TWICE A DAY, Disp: 180 tablet, Rfl: 1   fluconazole (DIFLUCAN) 150 MG tablet, Take 1 tablet (150 mg total) by mouth every 3 (three) days., Disp: 6 tablet, Rfl: 0   HYDROcodone-acetaminophen (NORCO/VICODIN) 5-325 MG tablet, Take 1 tablet by mouth every 4 (four) hours as needed for moderate pain., Disp: 30 tablet, Rfl: 0   insulin glargine (LANTUS) 100 UNIT/ML injection, Inject into the skin daily. Sliding Scale, Disp: , Rfl:    levothyroxine (SYNTHROID) 137 MCG tablet, Take 137 mcg by mouth every morning., Disp: , Rfl:    metoprolol succinate (TOPROL-XL) 25 MG 24 hr tablet, TAKE 1/2 TABLETS AT BEDTIME, Disp: 45 tablet, Rfl: 1   nitroGLYCERIN (NITROSTAT) 0.4 MG SL tablet, Place 1 tablet (0.4 mg total) under the tongue every 5 (five) minutes as needed for chest  pain., Disp: 25 tablet, Rfl: 2   Potassium Chloride ER 20 MEQ TBCR, TAKE 2 TABLETS BY MOUTH TWICE A DAY, Disp: 120 tablet, Rfl: 2   spironolactone (ALDACTONE) 25 MG tablet, Take 0.5 tablets (12.5 mg total) by mouth daily., Disp: 45 tablet, Rfl: 1   torsemide (DEMADEX) 20 MG tablet, Take 20 mg by mouth 2 (two) times daily., Disp: , Rfl:    Vitamin D, Ergocalciferol, (DRISDOL) 1.25 MG (50000 UNIT) CAPS capsule, Take 50,000 Units by mouth every 7 (seven) days., Disp: , Rfl:   Social History   Tobacco Use  Smoking Status Former   Packs/day: 1.00   Years: 30.00   Pack years: 30.00   Types: Cigarettes   Quit date: 02/2017   Years since quitting: 3.9  Smokeless Tobacco Never  Tobacco Comments   started in her [age] until 2009     Allergies  Allergen Reactions   Other Other (See  Comments)    NO Blind Scopes with Naso Gastric tube.  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   Latex     Tape= burns   Nsaids Other (See Comments)    S/P gastric bypass-told not to take. GI bleeds with NSAIDS   Objective:  There were no vitals filed for this visit. There is no height or weight on file to calculate BMI. Constitutional Well developed. Well nourished.  Vascular Dorsalis pedis pulses palpable bilaterally. Posterior tibial pulses palpable bilaterally. Capillary refill normal to all digits.  No cyanosis or clubbing noted. Pedal hair growth normal.  Neurologic Normal speech. Oriented to person, place, and time. Epicritic sensation to light touch grossly present bilaterally.  Dermatologic Left submetatarsal 1 ulceration completely epithelialized.  No further ulceration noted upon debridement.  No other clinical signs of infection noted.  Orthopedic: Normal joint ROM without pain or crepitus bilaterally. No visible deformities. No bony tenderness.   Radiographs: None Assessment:   1. Chronic foot ulcer, limited to breakdown of skin, left ([place])   2. Diabetes mellitus with complication [facility])    Plan:  Patient was evaluated and treated and all questions answered.  Left submetatarsal 1 ulceration limited to breakdown of the skin -I explained to the patient the etiology of ulceration various treatment options were extensively discussed.  I discussed with her offloading shoe gear modification extensive detail she states understanding.  Upon debridement no further ulceration noted.  She is officially discharged from my care if any foot and ankle issues arise in the future come back and see me.  If she would also  like to discuss further about orthotics she can follow-up with me or EJ.  No follow-ups on file.

## 2021-03-20 ENCOUNTER — Ambulatory Visit: Payer: Medicare Other | Attending: Internal Medicine

## 2021-03-20 DIAGNOSIS — Z20822 Contact with and (suspected) exposure to covid-19: Secondary | ICD-10-CM

## 2021-03-22 LAB — NOVEL CORONAVIRUS, NAA: SARS-CoV-2, NAA: NOT DETECTED

## 2021-03-22 LAB — SPECIMEN STATUS REPORT

## 2021-03-22 LAB — SARS-COV-2, NAA 2 DAY TAT

## 2021-03-23 ENCOUNTER — Ambulatory Visit (INDEPENDENT_AMBULATORY_CARE_PROVIDER_SITE_OTHER): Payer: Medicare Other

## 2021-03-23 ENCOUNTER — Other Ambulatory Visit: Payer: Self-pay | Admitting: Internal Medicine

## 2021-03-23 DIAGNOSIS — I441 Atrioventricular block, second degree: Secondary | ICD-10-CM

## 2021-03-23 LAB — CUP PACEART REMOTE DEVICE CHECK
Battery Remaining Longevity: 14 mo
Battery Remaining Percentage: 16 %
Battery Voltage: 2.87 V
Brady Statistic AP VP Percent: 92 %
Brady Statistic AP VS Percent: 1 %
Brady Statistic AS VP Percent: 7.9 %
Brady Statistic AS VS Percent: 1 %
Brady Statistic RA Percent Paced: 91 %
Brady Statistic RV Percent Paced: 99 %
Date Time Interrogation Session: 20220724173959
Implantable Lead Implant Date: 20150506
Implantable Lead Implant Date: 20150506
Implantable Lead Location: 753859
Implantable Lead Location: 753860
Implantable Pulse Generator Implant Date: 20150506
Lead Channel Impedance Value: 360 Ohm
Lead Channel Impedance Value: 410 Ohm
Lead Channel Pacing Threshold Amplitude: 0.75 V
Lead Channel Pacing Threshold Amplitude: 0.75 V
Lead Channel Pacing Threshold Pulse Width: 0.4 ms
Lead Channel Pacing Threshold Pulse Width: 0.4 ms
Lead Channel Sensing Intrinsic Amplitude: 0.5 mV
Lead Channel Sensing Intrinsic Amplitude: 9.3 mV
Lead Channel Setting Pacing Amplitude: 2.5 V
Lead Channel Setting Pacing Amplitude: 2.5 V
Lead Channel Setting Pacing Pulse Width: 0.4 ms
Lead Channel Setting Sensing Sensitivity: 4 mV
Pulse Gen Model: 2240
Pulse Gen Serial Number: 3013730

## 2021-04-13 ENCOUNTER — Other Ambulatory Visit: Payer: Self-pay | Admitting: Interventional Cardiology

## 2021-04-16 ENCOUNTER — Encounter: Payer: Self-pay | Admitting: Internal Medicine

## 2021-04-16 NOTE — Progress Notes (Signed)
Remote pacemaker transmission.   

## 2021-04-24 ENCOUNTER — Encounter: Payer: Self-pay | Admitting: Podiatry

## 2021-04-24 ENCOUNTER — Encounter: Payer: Self-pay | Admitting: Internal Medicine

## 2021-04-29 NOTE — Progress Notes (Signed)
Cardiology Office Note:    Date:  04/30/2021   ID:  [name], DOB [birth year], MRN [id number]  PCP:  Hoyt Koch, MD  Cardiologist:  Sinclair Grooms, MD   Referring MD: Hoyt Koch, *   Chief Complaint  Patient presents with   Coronary Artery Disease   Atrial Fibrillation   Follow-up    Pacemaker    History of Present Illness:    Mary [name] is a 80 y.o. 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   Mary [name] had recent hospital stay for an infected left foot with ulcer.  It cleared up with antibiotic therapy and wound care.  No cardiac complaints.  Denies orthopnea.  Sleeps in bed for only 2 hours.  She awakens and gets in a recliner.  She will not wear CPAP.  She denies angina.  No nitroglycerin use.  No prolonged racing heart episodes.  No episodes of syncope.  Does have some lower extremity swelling from time to time.  None today.  Past Medical History:  Diagnosis Date   Arthritis    Atrial tachycardia ([place]) 04/23/2017   CAD (coronary artery disease)    non obstructive   Cardiac pacemaker in situ - St Jude 05/13/2014   Permanent pacemaker for second-degree heart block. Procedure complicated by an atrial lead dislodgment and repeat procedure, 12/2013    Carotid stenosis    Carotid US 5/16:  Bilateral ICA 1-39%; L vertebral retrograde; L BP 126/49, R BP 140/57   Chronic combined systolic and diastolic CHF (congestive heart failure) (HCC)    Chronic lower back pain    Colon cancer (HCC)    a. s/p chemo   Colon polyps    Diabetic peripheral neuropathy (HCC)    DVT (deep venous thrombosis) ([place]) 2013   "twice behind knee on left side" (06/25/2013)   Glaucoma    History of chicken pox    Hyperlipidemia    Hypertension    Hypothyroidism    Iron deficiency anemia     Multinodular goiter    Osteoporosis    PAF (paroxysmal atrial fibrillation) (HCC)    Sleep apnea    a. resolved post weight loss    Type II diabetes mellitus ([place])     Past Surgical History:  Procedure Laterality Date   CARDIAC CATHETERIZATION  2006   Never had PCI, 3 caths total. Last one in [place] N/A 02/04/2015   Procedure: Left Heart Cath and Coronary Angiography;  Surgeon: Belva Crome, MD;  Location: [place] [facility];  Service: Cardiovascular;  Laterality: N/A;   CATARACT EXTRACTION, BILATERAL Bilateral 2013   "and put stent in my left eye for glaucoma" (06/25/2013)   CHOLECYSTECTOMY  1980's   COLECTOMY  10/2010   Tumor removal   EYE MUSCLE SURGERY Bilateral ~ 1963   "muscles too long; eyes would go out and up; tied muscles to hold my eyes straight" (06/25/2013)   LEAD REVISION  01-03-2014   atrial lead revision by Dr Rayann Heman   LEAD REVISION N/A 01/03/2014   Procedure: LEAD REVISION;  Surgeon: Coralyn Mark, MD;  Location: [facility] CATH LAB;  Service: Cardiovascular;  Laterality: N/A;   LEFT HEART CATH AND CORONARY ANGIOGRAPHY N/A 05/29/2018   Procedure: LEFT HEART CATH AND CORONARY ANGIOGRAPHY;  Surgeon: Belva Crome, MD;  Location: [place] [facility];  Service: Cardiovascular;  Laterality: N/A;   PACEMAKER INSERTION  01/02/2014   STJ Assurity dual chamber pacemaker implnated by Dr Lovena Le for SSS   PERMANENT PACEMAKER INSERTION N/A 01/02/2014   Procedure: PERMANENT PACEMAKER INSERTION;  Surgeon: Evans Lance, MD;  Location: [facility] CATH LAB;  Service: Cardiovascular;  Laterality: N/A;   PORTACATH PLACEMENT Left 11/2010   ROUX-EN-Y GASTRIC BYPASS  2009   ULNAR TUNNEL RELEASE Left ~ [id number]   UMBILICAL HERNIA REPAIR  1970's?    (06/25/2013)    Current Medications: Current Meds  Medication Sig   atorvastatin (LIPITOR) 80 MG tablet TAKE 1 TABLET(80 MG) BY MOUTH DAILY   bimatoprost (LUMIGAN) 0.01 % SOLN Place 1 drop into the right eye at bedtime.    buPROPion  (WELLBUTRIN XL) 150 MG 24 hr tablet TAKE 1 TABLET BY MOUTH EVERY DAY   ELIQUIS 5 MG TABS tablet TAKE 1 TABLET BY MOUTH TWICE A DAY   HYDROcodone-acetaminophen (NORCO/VICODIN) 5-325 MG tablet Take 1 tablet by mouth every 4 (four) hours as needed for moderate pain.   insulin glargine (LANTUS) 100 UNIT/ML injection Inject into the skin daily. Sliding Scale   levothyroxine (SYNTHROID) 137 MCG tablet Take 137 mcg by mouth every morning.   metoprolol succinate (TOPROL-XL) 25 MG 24 hr tablet TAKE 1/2 TABLETS AT BEDTIME   nitroGLYCERIN (NITROSTAT) 0.4 MG SL tablet Place 1 tablet (0.4 mg total) under the tongue every 5 (five) minutes as needed for chest pain.   Potassium Chloride ER 20 MEQ TBCR TAKE 2 TABLETS BY MOUTH TWICE A DAY   spironolactone (ALDACTONE) 25 MG tablet Take 0.5 tablets (12.5 mg total) by mouth daily.   torsemide (DEMADEX) 20 MG tablet Take 20 mg by mouth 2 (two) times daily.     Allergies:   Other, Adhesive [tape], Aspirin, Latex, and Nsaids   Social History   Socioeconomic History   Marital status: Married    Spouse name: [name]   Number of children: 0   Years of education: Not on file   Highest education level: Not on file  Occupational History   Occupation: Retired    Comment: office work  Tobacco Use   Smoking status: Former    Packs/day: 1.00    Years: 30.00    Pack years: 30.00    Types: Cigarettes    Quit date: 02/2017    Years since quitting: 4.1   Smokeless tobacco: Never   Tobacco comments:    started in her [age] until 2009   Vaping Use   Vaping Use: Never used  Substance and Sexual Activity   Alcohol use: Not Currently    Alcohol/week: 0.0 standard drinks   Drug use: No   Sexual activity: Not on file  Other Topics Concern   Not on file  Social History Narrative   Married to husband, [name]   No children   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   Recently moved back here from [place] Strain: Not on [employer] Insecurity: Not on file  Transportation Needs: Not on file  Physical Activity: Not on file  Stress: Not on file  Social Connections: Not on file     Family History: The patient's family history includes Bladder Cancer in her brother and cousin; Breast cancer in her maternal aunt, mother, and sister; Clotting disorder in her sister;  Colon polyps in her brother and brother; Diabetes in her maternal grandmother, mother, and sister; Heart attack in her brother, father, and sister; Heart disease in her father; Pancreatic cancer in her brother; Prostate cancer in her brother.  ROS:   Please see the history of present illness.    Significant constipation.  Will be seeing Dr. Henrene Pastor of GI.  All other systems reviewed and are negative.  EKGs/Labs/Other Studies Reviewed:    The following studies were reviewed today: No new or recent imaging from cardiac standpoint.  EKG:  EKG AV sequential pacing.  No change compared to prior.  Recent Labs: 05/29/2020: NT-Pro BNP 1,027 07/31/2020: TSH 21.48 02/05/2021: ALT 11 02/06/2021: Hemoglobin 11.7; Platelets 300 02/07/2021: BUN 31; Creatinine, Ser 1.29; Magnesium 2.2; Potassium 4.6; Sodium 131  Recent Lipid Panel    Component Value Date/Time   CHOL 208 (H) 07/31/2020 1625   TRIG 305.0 (H) 07/31/2020 1625   HDL 35.10 (L) 07/31/2020 1625   CHOLHDL 6 07/31/2020 1625   VLDL 61.0 (H) 07/31/2020 1625   LDLCALC 73 05/25/2018 0531   LDLDIRECT 116.0 07/31/2020 1625    Physical Exam:    VS:  BP 126/70   Pulse 72   Ht '5\' 7"'$  (1.702 m)   Wt 215 lb 12 oz (97.9 kg)   SpO2 96%   BMI 33.79 kg/m     Wt Readings from Last 3 Encounters:  04/30/21 215 lb 12 oz (97.9 kg)  04/30/21 215 lb 12.8 oz (97.9 kg)  02/12/21 201 lb (91.2 kg)     GEN: Obese. No acute distress HEENT: Normal NECK: No JVD. LYMPHATICS: No lymphadenopathy CARDIAC: No murmur. RRR no gallop, or edema. VASCULAR:  Normal Pulses. No  bruits. RESPIRATORY:  Clear to auscultation without rales, wheezing or rhonchi  ABDOMEN: Soft, non-tender, non-distended, No pulsatile mass, MUSCULOSKELETAL: No deformity  SKIN: Warm and dry NEUROLOGIC:  Alert and oriented x 3 PSYCHIATRIC:  Normal affect   ASSESSMENT:    1. Coronary artery disease of native artery of native heart with stable angina pectoris (HCC)   2. Cardiac pacemaker in situ - St Jude   3. PAF (paroxysmal atrial fibrillation) ([place])   4. Second degree AV block, Mobitz type II   5. Chronic anticoagulation   6. Chronic diastolic heart failure ([place])   7. Chronic obstructive pulmonary disease, unspecified COPD type ([place])   8. Tobacco abuse   9. Essential hypertension    PLAN:    In order of problems listed above:  Secondary prevention discussed.  Unable to achieve any moderate physical activity which places her at a significant longevity disadvantage. Following pacemaker power source life.  Will need power change and the next 12 months. Continue Eliquis.  No symptomatic episodes of A. fib. Treated with pacemaker No bleeding on Eliquis.  Has had several falls but likely has not injured herself but had significant bleeding.  Falls have been related to loss of balance. No clinical evidence of volume overload on today's exam. No longer smoking Blood pressure is adequately controlled on current regimen which includes Toprol-XL, Aldactone, and Demadex.    Overall education and awareness concerning secondary risk prevention was discussed in detail: LDL less than 70, hemoglobin A1c less than 7, blood pressure target less than 130/80 mmHg, >150 minutes of moderate aerobic activity per week, avoidance of smoking, weight control (via diet and exercise), and continued surveillance/management of/for obstructive sleep apnea.    Medication Adjustments/Labs and Tests Ordered: Current medicines are reviewed at length with the  patient today.  Concerns regarding medicines are  outlined above.  Orders Placed This Encounter  Procedures   EKG 12-Lead   No orders of the defined types were placed in this encounter.   Patient Instructions  Medication Instructions:  Your physician recommends that you continue on your current medications as directed. Please refer to the Current Medication list given to you today.  *If you need a refill on your cardiac medications before your next appointment, please call your pharmacy*   Lab Work: None If you have labs (blood work) drawn today and your tests are completely normal, you will receive your results only by: [place] (if you have MyChart) OR A paper copy in the mail If you have any lab test that is abnormal or we need to change your treatment, we will call you to review the results.   Testing/Procedures: None   Follow-Up: At [facility], you and your health needs are our priority.  As part of our continuing mission to provide you with exceptional heart care, we have created designated Provider Care Teams.  These Care Teams include your primary Cardiologist (physician) and Advanced Practice Providers (APPs -  Physician Assistants and Nurse Practitioners) who all work together to provide you with the care you need, when you need it.  We recommend signing up for the patient portal called "MyChart".  Sign up information is provided on this After Visit Summary.  MyChart is used to connect with patients for Virtual Visits (Telemedicine).  Patients are able to view lab/test results, encounter notes, upcoming appointments, etc.  Non-urgent messages can be sent to your provider as well.   To learn more about what you can do with MyChart, go to [email or web address].    Your next appointment:   9-12 month(s)  The format for your next appointment:   In Person  Provider:   You may see Sinclair Grooms, MD or one of the following Advanced Practice Providers on your designated Care Team:   Cecilie Kicks,  NP   Other Instructions     Signed, Sinclair Grooms, MD  04/30/2021 11:58 AM    [place]

## 2021-04-30 ENCOUNTER — Ambulatory Visit: Payer: Medicare Other | Admitting: Interventional Cardiology

## 2021-04-30 ENCOUNTER — Encounter: Payer: Self-pay | Admitting: Student

## 2021-04-30 ENCOUNTER — Encounter: Payer: Self-pay | Admitting: Interventional Cardiology

## 2021-04-30 ENCOUNTER — Ambulatory Visit (INDEPENDENT_AMBULATORY_CARE_PROVIDER_SITE_OTHER): Payer: Medicare Other | Admitting: Student

## 2021-04-30 ENCOUNTER — Other Ambulatory Visit: Payer: Self-pay

## 2021-04-30 VITALS — BP 126/70 | HR 72 | Ht 67.0 in | Wt 215.8 lb

## 2021-04-30 VITALS — BP 126/70 | HR 73 | Ht 67.0 in | Wt 215.8 lb

## 2021-04-30 DIAGNOSIS — Z950 Presence of cardiac pacemaker: Secondary | ICD-10-CM

## 2021-04-30 DIAGNOSIS — I441 Atrioventricular block, second degree: Secondary | ICD-10-CM | POA: Diagnosis not present

## 2021-04-30 DIAGNOSIS — I5032 Chronic diastolic (congestive) heart failure: Secondary | ICD-10-CM | POA: Diagnosis not present

## 2021-04-30 DIAGNOSIS — I10 Essential (primary) hypertension: Secondary | ICD-10-CM

## 2021-04-30 DIAGNOSIS — I25118 Atherosclerotic heart disease of native coronary artery with other forms of angina pectoris: Secondary | ICD-10-CM | POA: Diagnosis not present

## 2021-04-30 DIAGNOSIS — Z720 Tobacco use: Secondary | ICD-10-CM

## 2021-04-30 DIAGNOSIS — I480 Paroxysmal atrial fibrillation: Secondary | ICD-10-CM | POA: Diagnosis not present

## 2021-04-30 DIAGNOSIS — Z7901 Long term (current) use of anticoagulants: Secondary | ICD-10-CM

## 2021-04-30 DIAGNOSIS — J449 Chronic obstructive pulmonary disease, unspecified: Secondary | ICD-10-CM

## 2021-04-30 NOTE — Progress Notes (Signed)
Electrophysiology Office Note Date: 04/30/2021  ID:  Mary [name], DOB 1940/10/28, MRN [id number]  PCP: Hoyt Koch, MD Primary Cardiologist: Sinclair Grooms, MD Electrophysiologist: [name], MD   CC: Pacemaker follow-up  Mary [name] is a 80 y.o. female seen today for [name], MD for routine electrophysiology followup.  Since last being seen in our clinic the patient reports doing very well.  she denies chest pain, palpitations, dyspnea, PND, orthopnea, nausea, vomiting, dizziness, syncope, edema, weight gain, or early satiety.  Device History: STJ dual chamber PPM implanted 2015 for Mobitz II  Past Medical History:  Diagnosis Date   Arthritis    Atrial tachycardia ([place]) 04/23/2017   CAD (coronary artery disease)    non obstructive   Cardiac pacemaker in situ - St Jude 05/13/2014   Permanent pacemaker for second-degree heart block. Procedure complicated by an atrial lead dislodgment and repeat procedure, 12/2013    Carotid stenosis    Carotid US 5/16:  Bilateral ICA 1-39%; L vertebral retrograde; L BP 126/49, R BP 140/57   Chronic combined systolic and diastolic CHF (congestive heart failure) (HCC)    Chronic lower back pain    Colon cancer (HCC)    a. s/p chemo   Colon polyps    Diabetic peripheral neuropathy (HCC)    DVT (deep venous thrombosis) ([place]) 2013   "twice behind knee on left side" (06/25/2013)   Glaucoma    History of chicken pox    Hyperlipidemia    Hypertension    Hypothyroidism    Iron deficiency anemia    Multinodular goiter    Osteoporosis    PAF (paroxysmal atrial fibrillation) (HCC)    Sleep apnea    a. resolved post weight loss    Type II diabetes mellitus ([place])    Past Surgical History:  Procedure Laterality Date   CARDIAC CATHETERIZATION  2006   Never had PCI, 3 caths total. Last one in [place] N/A 02/04/2015   Procedure: Left Heart Cath and Coronary Angiography;  Surgeon: Belva Crome, MD;   Location: [place] [facility];  Service: Cardiovascular;  Laterality: N/A;   CATARACT EXTRACTION, BILATERAL Bilateral 2013   "and put stent in my left eye for glaucoma" (06/25/2013)   CHOLECYSTECTOMY  1980's   COLECTOMY  10/2010   Tumor removal   EYE MUSCLE SURGERY Bilateral ~ 1963   "muscles too long; eyes would go out and up; tied muscles to hold my eyes straight" (06/25/2013)   LEAD REVISION  01-03-2014   atrial lead revision by Dr Rayann Heman   LEAD REVISION N/A 01/03/2014   Procedure: LEAD REVISION;  Surgeon: Coralyn Mark, MD;  Location: [facility] CATH LAB;  Service: Cardiovascular;  Laterality: N/A;   LEFT HEART CATH AND CORONARY ANGIOGRAPHY N/A 05/29/2018   Procedure: LEFT HEART CATH AND CORONARY ANGIOGRAPHY;  Surgeon: Belva Crome, MD;  Location: [place] [facility];  Service: Cardiovascular;  Laterality: N/A;   PACEMAKER INSERTION  01/02/2014   STJ Assurity dual chamber pacemaker implnated by Dr Lovena Le for SSS   PERMANENT PACEMAKER INSERTION N/A 01/02/2014   Procedure: PERMANENT PACEMAKER INSERTION;  Surgeon: Evans Lance, MD;  Location: [facility] CATH LAB;  Service: Cardiovascular;  Laterality: N/A;   PORTACATH PLACEMENT Left 11/2010   ROUX-EN-Y GASTRIC BYPASS  2009   ULNAR TUNNEL RELEASE Left ~ [id number]   UMBILICAL HERNIA REPAIR  1970's?    (06/25/2013)    Current Outpatient Medications  Medication  Sig Dispense Refill   atorvastatin (LIPITOR) 80 MG tablet TAKE 1 TABLET(80 MG) BY MOUTH DAILY 90 tablet 0   bimatoprost (LUMIGAN) 0.01 % SOLN Place 1 drop into the right eye at bedtime.      buPROPion (WELLBUTRIN XL) 150 MG 24 hr tablet TAKE 1 TABLET BY MOUTH EVERY DAY 90 tablet 1   ELIQUIS 5 MG TABS tablet TAKE 1 TABLET BY MOUTH TWICE A DAY 180 tablet 1   HYDROcodone-acetaminophen (NORCO/VICODIN) 5-325 MG tablet Take 1 tablet by mouth every 4 (four) hours as needed for moderate pain. 30 tablet 0   insulin glargine (LANTUS) 100 UNIT/ML injection Inject into the skin daily. Sliding Scale     levothyroxine  (SYNTHROID) 137 MCG tablet Take 137 mcg by mouth every morning.     metoprolol succinate (TOPROL-XL) 25 MG 24 hr tablet TAKE 1/2 TABLETS AT BEDTIME 45 tablet 1   nitroGLYCERIN (NITROSTAT) 0.4 MG SL tablet Place 1 tablet (0.4 mg total) under the tongue every 5 (five) minutes as needed for chest pain. 25 tablet 2   Potassium Chloride ER 20 MEQ TBCR TAKE 2 TABLETS BY MOUTH TWICE A DAY 120 tablet 2   spironolactone (ALDACTONE) 25 MG tablet Take 0.5 tablets (12.5 mg total) by mouth daily. 45 tablet 1   torsemide (DEMADEX) 20 MG tablet Take 20 mg by mouth 2 (two) times daily.     Vitamin D, Ergocalciferol, (DRISDOL) 1.25 MG (50000 UNIT) CAPS capsule Take 50,000 Units by mouth every 7 (seven) days.     acetaminophen (TYLENOL) 325 MG tablet Take 650 mg by mouth every 6 (six) hours as needed for mild pain, fever or headache. (Patient not taking: Reported on 04/30/2021)     No current facility-administered medications for this visit.    Allergies:   Other, Adhesive [tape], Aspirin, Latex, and Nsaids   Social History: Social History   Socioeconomic History   Marital status: Married    Spouse name: Mary [name]   Number of children: 0   Years of education: Not on file   Highest education level: Not on file  Occupational History   Occupation: Retired    Comment: office work  Tobacco Use   Smoking status: Former    Packs/day: 1.00    Years: 30.00    Pack years: 30.00    Types: Cigarettes    Quit date: 02/2017    Years since quitting: 4.1   Smokeless tobacco: Never   Tobacco comments:    started in her [age] until 2009   Vaping Use   Vaping Use: Never used  Substance and Sexual Activity   Alcohol use: Not Currently    Alcohol/week: 0.0 standard drinks   Drug use: No   Sexual activity: Not on file  Other Topics Concern   Not on file  Social History Narrative   Married to husband, Mary [name]   No children   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   Recently moved back here  from [place] Strain: Not on [employer] Insecurity: Not on file  Transportation Needs: Not on file  Physical Activity: Not on file  Stress: Not on file  Social Connections: Not on file  Intimate Partner Violence: Not on file    Family History: Family History  Problem Relation Age of Onset   Breast cancer Mother    Diabetes Mother    Heart disease Father    Heart  attack Father    Heart attack Sister    Heart attack Brother    Bladder Cancer Brother    Prostate cancer Brother    Colon polyps Brother    Breast cancer Sister    Breast cancer Maternal Aunt        x 2 aunts   Pancreatic cancer Brother    Colon polyps Brother    Bladder Cancer Cousin    Clotting disorder Sister    Diabetes Sister        x 3   Diabetes Maternal Grandmother      Review of Systems: All other systems reviewed and are otherwise negative except as noted above.  Physical Exam: Vitals:   04/30/21 1033  BP: 126/70  Pulse: 73  SpO2: 95%  Weight: 215 lb 12.8 oz (97.9 kg)  Height: '5\' 7"'$  (1.702 m)     GEN- The patient is well appearing, alert and oriented x 3 today.   HEENT: normocephalic, atraumatic; sclera clear, conjunctiva pink; hearing intact; oropharynx clear; neck supple  Lungs- Clear to ausculation bilaterally, normal work of breathing.  No wheezes, rales, rhonchi Heart- Regular rate and rhythm, no murmurs, rubs or gallops  GI- soft, non-tender, non-distended, bowel sounds present  Extremities- no clubbing or cyanosis. No edema MS- no significant deformity or atrophy Skin- warm and dry, no rash or lesion; PPM pocket well healed Psych- euthymic mood, full affect Neuro- strength and sensation are intact  PPM Interrogation- reviewed in detail today,  See PACEART report  EKG:  EKG is not ordered today.  Recent Labs: 05/29/2020: NT-Pro BNP 1,027 07/31/2020: TSH 21.48 02/05/2021: ALT 11 02/06/2021: Hemoglobin 11.7; Platelets  300 02/07/2021: BUN 31; Creatinine, Ser 1.29; Magnesium 2.2; Potassium 4.6; Sodium 131   Wt Readings from Last 3 Encounters:  04/30/21 215 lb 12.8 oz (97.9 kg)  02/12/21 201 lb (91.2 kg)  02/04/21 204 lb (92.5 kg)     Other studies Reviewed: Additional studies/ records that were reviewed today include: Previous EP office notes, Previous remote checks, Most recent labwork.   Assessment and Plan:  1. CHB s/p St. Jude PPM  Normal PPM function See Pace Art report No changes today  2. Atrial tachycardia Stable on toprol. Burden only 4.1% and asymptomatic. Episodes short. Previously tolerated flecainide. If needed, could consider restarting vs tikosyn vs amiodarone.  3. HTN Stable on current regimen   4. Obesity Body mass index is 33.8 kg/m. Encouraged lifestyle modification including regular physical activity, heart-healthy diet, and weight reduction.  5. Non obstructive CAD Denies s/s ischemia   Current medicines are reviewed at length with the patient today.   The patient does not have concerns regarding her medicines.  The following changes were made today:  none  Labs/ tests ordered today include:  Labwork from 01/2021 reviewed  Disposition:   Follow up with Dr. Rayann Heman in 12 Months. At that time should be able to discuss gen change.    Jacalyn Lefevre, PA-C  04/30/2021 11:05 AM  [facility] [address] [place] [address] [phone] (office) [phone] (fax)

## 2021-04-30 NOTE — Patient Instructions (Signed)
Medication Instructions:  Your physician recommends that you continue on your current medications as directed. Please refer to the Current Medication list given to you today.  *If you need a refill on your cardiac medications before your next appointment, please call your pharmacy*   Lab Work: None If you have labs (blood work) drawn today and your tests are completely normal, you will receive your results only by: [place] (if you have MyChart) OR A paper copy in the mail If you have any lab test that is abnormal or we need to change your treatment, we will call you to review the results.   Follow-Up: At [facility], you and your health needs are our priority.  As part of our continuing mission to provide you with exceptional heart care, we have created designated Provider Care Teams.  These Care Teams include your primary Cardiologist (physician) and Advanced Practice Providers (APPs -  Physician Assistants and Nurse Practitioners) who all work together to provide you with the care you need, when you need it.   Your next appointment:   1 year(s)  The format for your next appointment:   In Person  Provider:   [name], MD

## 2021-04-30 NOTE — Patient Instructions (Signed)
Medication Instructions:  Your physician recommends that you continue on your current medications as directed. Please refer to the Current Medication list given to you today.  *If you need a refill on your cardiac medications before your next appointment, please call your pharmacy*   Lab Work: None If you have labs (blood work) drawn today and your tests are completely normal, you will receive your results only by: [place] (if you have MyChart) OR A paper copy in the mail If you have any lab test that is abnormal or we need to change your treatment, we will call you to review the results.   Testing/Procedures: None   Follow-Up: At [facility], you and your health needs are our priority.  As part of our continuing mission to provide you with exceptional heart care, we have created designated Provider Care Teams.  These Care Teams include your primary Cardiologist (physician) and Advanced Practice Providers (APPs -  Physician Assistants and Nurse Practitioners) who all work together to provide you with the care you need, when you need it.  We recommend signing up for the patient portal called "MyChart".  Sign up information is provided on this After Visit Summary.  MyChart is used to connect with patients for Virtual Visits (Telemedicine).  Patients are able to view lab/test results, encounter notes, upcoming appointments, etc.  Non-urgent messages can be sent to your provider as well.   To learn more about what you can do with MyChart, go to [email or web address].    Your next appointment:   9-12 month(s)  The format for your next appointment:   In Person  Provider:   You may see Sinclair Grooms, MD or one of the following Advanced Practice Providers on your designated Care Team:   Cecilie Kicks, NP   Other Instructions

## 2021-05-04 ENCOUNTER — Encounter (INDEPENDENT_AMBULATORY_CARE_PROVIDER_SITE_OTHER): Payer: Self-pay

## 2021-05-05 LAB — CUP PACEART INCLINIC DEVICE CHECK
Battery Remaining Longevity: 13 mo
Battery Voltage: 2.86 V
Brady Statistic RA Percent Paced: 92 %
Brady Statistic RV Percent Paced: 99.71 %
Date Time Interrogation Session: 20220901110605
Implantable Lead Implant Date: 20150506
Implantable Lead Implant Date: 20150506
Implantable Lead Location: 753859
Implantable Lead Location: 753860
Implantable Pulse Generator Implant Date: 20150506
Lead Channel Impedance Value: 362.5 Ohm
Lead Channel Impedance Value: 450 Ohm
Lead Channel Pacing Threshold Amplitude: 0.75 V
Lead Channel Pacing Threshold Amplitude: 0.75 V
Lead Channel Pacing Threshold Amplitude: 0.75 V
Lead Channel Pacing Threshold Amplitude: 0.75 V
Lead Channel Pacing Threshold Pulse Width: 0.4 ms
Lead Channel Pacing Threshold Pulse Width: 0.4 ms
Lead Channel Pacing Threshold Pulse Width: 0.4 ms
Lead Channel Pacing Threshold Pulse Width: 0.4 ms
Lead Channel Sensing Intrinsic Amplitude: 0.5 mV
Lead Channel Sensing Intrinsic Amplitude: 9.3 mV
Lead Channel Setting Pacing Amplitude: 2.5 V
Lead Channel Setting Pacing Amplitude: 2.5 V
Lead Channel Setting Pacing Pulse Width: 0.4 ms
Lead Channel Setting Sensing Sensitivity: 4 mV
Pulse Gen Model: 2240
Pulse Gen Serial Number: 3013730

## 2021-05-11 ENCOUNTER — Encounter: Payer: Self-pay | Admitting: Internal Medicine

## 2021-05-11 ENCOUNTER — Ambulatory Visit: Payer: Medicare Other

## 2021-05-14 ENCOUNTER — Telehealth: Payer: Self-pay | Admitting: *Deleted

## 2021-05-14 NOTE — Telephone Encounter (Signed)
This patient was scheduled for an AWV in error with me. Please call pt to reschedule with your health coach during regular hours or with me after 5pm Mon-Friday or weekends

## 2021-05-20 ENCOUNTER — Ambulatory Visit: Payer: Medicare Other | Admitting: Podiatry

## 2021-05-22 ENCOUNTER — Ambulatory Visit: Payer: Medicare Other | Admitting: Podiatry

## 2021-06-06 ENCOUNTER — Emergency Department (HOSPITAL_BASED_OUTPATIENT_CLINIC_OR_DEPARTMENT_OTHER): Payer: Medicare Other

## 2021-06-06 ENCOUNTER — Emergency Department (HOSPITAL_BASED_OUTPATIENT_CLINIC_OR_DEPARTMENT_OTHER)
Admission: EM | Admit: 2021-06-06 | Discharge: 2021-06-06 | Disposition: A | Discharge status: Home / Self Care | Payer: Medicare Other | Attending: Student | Admitting: Student

## 2021-06-06 ENCOUNTER — Other Ambulatory Visit: Payer: Self-pay

## 2021-06-06 ENCOUNTER — Encounter (HOSPITAL_BASED_OUTPATIENT_CLINIC_OR_DEPARTMENT_OTHER): Payer: Self-pay | Admitting: *Deleted

## 2021-06-06 DIAGNOSIS — Z950 Presence of cardiac pacemaker: Secondary | ICD-10-CM | POA: Insufficient documentation

## 2021-06-06 DIAGNOSIS — W01198A Fall on same level from slipping, tripping and stumbling with subsequent striking against other object, initial encounter: Secondary | ICD-10-CM | POA: Diagnosis not present

## 2021-06-06 DIAGNOSIS — M533 Sacrococcygeal disorders, not elsewhere classified: Secondary | ICD-10-CM | POA: Insufficient documentation

## 2021-06-06 DIAGNOSIS — Z79899 Other long term (current) drug therapy: Secondary | ICD-10-CM | POA: Insufficient documentation

## 2021-06-06 DIAGNOSIS — Y9301 Activity, walking, marching and hiking: Secondary | ICD-10-CM | POA: Insufficient documentation

## 2021-06-06 DIAGNOSIS — Z7901 Long term (current) use of anticoagulants: Secondary | ICD-10-CM | POA: Insufficient documentation

## 2021-06-06 DIAGNOSIS — I5042 Chronic combined systolic (congestive) and diastolic (congestive) heart failure: Secondary | ICD-10-CM | POA: Insufficient documentation

## 2021-06-06 DIAGNOSIS — J449 Chronic obstructive pulmonary disease, unspecified: Secondary | ICD-10-CM | POA: Insufficient documentation

## 2021-06-06 DIAGNOSIS — W19XXXA Unspecified fall, initial encounter: Secondary | ICD-10-CM

## 2021-06-06 DIAGNOSIS — E1142 Type 2 diabetes mellitus with diabetic polyneuropathy: Secondary | ICD-10-CM | POA: Diagnosis not present

## 2021-06-06 DIAGNOSIS — I4891 Unspecified atrial fibrillation: Secondary | ICD-10-CM | POA: Insufficient documentation

## 2021-06-06 DIAGNOSIS — M542 Cervicalgia: Secondary | ICD-10-CM | POA: Diagnosis not present

## 2021-06-06 DIAGNOSIS — Z91040 Latex allergy status: Secondary | ICD-10-CM | POA: Diagnosis not present

## 2021-06-06 DIAGNOSIS — Z87891 Personal history of nicotine dependence: Secondary | ICD-10-CM | POA: Insufficient documentation

## 2021-06-06 DIAGNOSIS — Z85038 Personal history of other malignant neoplasm of large intestine: Secondary | ICD-10-CM | POA: Diagnosis not present

## 2021-06-06 DIAGNOSIS — M549 Dorsalgia, unspecified: Secondary | ICD-10-CM | POA: Insufficient documentation

## 2021-06-06 DIAGNOSIS — I110 Hypertensive heart disease with heart failure: Secondary | ICD-10-CM | POA: Insufficient documentation

## 2021-06-06 DIAGNOSIS — R519 Headache, unspecified: Secondary | ICD-10-CM | POA: Diagnosis not present

## 2021-06-06 DIAGNOSIS — I2510 Atherosclerotic heart disease of native coronary artery without angina pectoris: Secondary | ICD-10-CM | POA: Insufficient documentation

## 2021-06-06 DIAGNOSIS — E039 Hypothyroidism, unspecified: Secondary | ICD-10-CM | POA: Insufficient documentation

## 2021-06-06 DIAGNOSIS — Z794 Long term (current) use of insulin: Secondary | ICD-10-CM | POA: Diagnosis not present

## 2021-06-06 LAB — COMPREHENSIVE METABOLIC PANEL
ALT: 11 U/L (ref 0–44)
AST: 14 U/L — ABNORMAL LOW (ref 15–41)
Albumin: 3.9 g/dL (ref 3.5–5.0)
Alkaline Phosphatase: 122 U/L (ref 38–126)
Anion gap: 10 (ref 5–15)
BUN: 25 mg/dL — ABNORMAL HIGH (ref 8–23)
CO2: 29 mmol/L (ref 22–32)
Calcium: 9 mg/dL (ref 8.9–10.3)
Chloride: 92 mmol/L — ABNORMAL LOW (ref 98–111)
Creatinine, Ser: 1.33 mg/dL — ABNORMAL HIGH (ref 0.44–1.00)
GFR, Estimated: 41 mL/min — ABNORMAL LOW (ref >60)
Glucose, Bld: 197 mg/dL — ABNORMAL HIGH (ref 70–99)
Potassium: 3.9 mmol/L (ref 3.5–5.1)
Sodium: 131 mmol/L — ABNORMAL LOW (ref 135–145)
Total Bilirubin: 0.4 mg/dL (ref 0.3–1.2)
Total Protein: 8.4 g/dL — ABNORMAL HIGH (ref 6.5–8.1)

## 2021-06-06 LAB — CBC WITH DIFFERENTIAL/PLATELET
Abs Immature Granulocytes: 0.03 10*3/uL (ref 0.00–0.07)
Basophils Absolute: 0.1 10*3/uL (ref 0.0–0.1)
Basophils Relative: 1 %
Eosinophils Absolute: 0.2 10*3/uL (ref 0.0–0.5)
Eosinophils Relative: 2 %
HCT: 34.7 % — ABNORMAL LOW (ref 36.0–46.0)
Hemoglobin: 10.8 g/dL — ABNORMAL LOW (ref 12.0–15.0)
Immature Granulocytes: 0 %
Lymphocytes Relative: 25 %
Lymphs Abs: 2.4 10*3/uL (ref 0.7–4.0)
MCH: 26.1 pg (ref 26.0–34.0)
MCHC: 31.1 g/dL (ref 30.0–36.0)
MCV: 83.8 fL (ref 80.0–100.0)
Monocytes Absolute: 0.8 10*3/uL (ref 0.1–1.0)
Monocytes Relative: 8 %
Neutro Abs: 6.3 10*3/uL (ref 1.7–7.7)
Neutrophils Relative %: 64 %
Platelets: 460 10*3/uL — ABNORMAL HIGH (ref 150–400)
RBC: 4.14 MIL/uL (ref 3.87–5.11)
RDW: 14.2 % (ref 11.5–15.5)
WBC: 9.9 10*3/uL (ref 4.0–10.5)
nRBC: 0 % (ref 0.0–0.2)

## 2021-06-06 MED ORDER — HYDROCODONE-ACETAMINOPHEN 5-325 MG PO TABS
1.0000 | ORAL_TABLET | Freq: Four times a day (QID) | ORAL | 0 refills | Status: DC | PRN
Start: 2021-06-06 — End: 2021-06-12

## 2021-06-06 MED ORDER — IOHEXOL 300 MG/ML  SOLN
100.0000 mL | Freq: Once | INTRAMUSCULAR | Status: AC | PRN
  Administered 2021-06-06: 100 mL via INTRAVENOUS

## 2021-06-06 MED ORDER — MORPHINE SULFATE (PF) 4 MG/ML IV SOLN
4.0000 mg | Freq: Once | INTRAVENOUS | Status: AC
  Administered 2021-06-06: 4 mg via INTRAVENOUS
  Filled 2021-06-06: qty 1

## 2021-06-06 MED ORDER — SODIUM CHLORIDE 0.9 % IV BOLUS
1000.0000 mL | Freq: Once | INTRAVENOUS | Status: AC
  Administered 2021-06-06: 1000 mL via INTRAVENOUS

## 2021-06-06 MED ORDER — HYDROCODONE-ACETAMINOPHEN 5-325 MG PO TABS: 1.0000 | ORAL_TABLET | Freq: Four times a day (QID) | ORAL | 0 refills | Status: DC | PRN

## 2021-06-06 NOTE — ED Triage Notes (Addendum)
Pt reports she was stepping over her dog 2-3 days ago and tripped when the dog stood up. States she fell backwards and landed on her bottom then hit her head on the washing machine. States today is the first day she could walk. Reports frequent falls. She is on eliquis. Pt reports generalized pain, worse in back and back of head. CAO x 4. C/o headache

## 2021-06-06 NOTE — ED Notes (Signed)
Ambulated to the restroom, tolerated well.

## 2021-06-06 NOTE — ED Provider Notes (Signed)
[facility] Provider Note   CSN: [id number] Arrival date & time: 06/06/21  1312     History Chief Complaint  Patient presents with   Mary [name] Mary [name] is a 80 y.o. female with a past medical history of cardiac pacer, CHF, colon cancer status post chemo, peripheral neuropathy, DVT, hypertension, hyperlipidemia, A. fib anticoagulated with Eliquis who presents today for evaluation after a fall. She states that about 2 to 3 days ago she was walking into the bedroom when she went to step over their dog.  The dog got startled and stood up and she fell backward.  She landed on her "bottom" and struck the back of her head.  She denies passing out.  She states that she is aware she needs to get imaging anytime she strikes her head however states that "this was just a really bad fall."  She has had difficulty ambulating and getting around since the fall per patient and her husband. She reports pain in her head, neck, back, and buttocks.  She has chronic pain in her knees bilaterally which is unchanged.   She is unable to tell me why she is unable to ambulate since the fall, cannot tell me if it is because of pain, weakness, lightheadedness or another cause. HPI     Past Medical History:  Diagnosis Date   Arthritis    Atrial tachycardia ([place]) 04/23/2017   CAD (coronary artery disease)    non obstructive   Cardiac pacemaker in situ - St Jude 05/13/2014   Permanent pacemaker for second-degree heart block. Procedure complicated by an atrial lead dislodgment and repeat procedure, 12/2013    Carotid stenosis    Carotid US 5/16:  Bilateral ICA 1-39%; L vertebral retrograde; L BP 126/49, R BP 140/57   Chronic combined systolic and diastolic CHF (congestive heart failure) (HCC)    Chronic lower back pain    Colon cancer (HCC)    a. s/p chemo   Colon polyps    Diabetic peripheral neuropathy (HCC)    DVT (deep venous thrombosis) ([place]) 2013   "twice behind knee on  left side" (06/25/2013)   Glaucoma    History of chicken pox    Hyperlipidemia    Hypertension    Hypothyroidism    Iron deficiency anemia    Multinodular goiter    Osteoporosis    PAF (paroxysmal atrial fibrillation) ([place])    Sleep apnea    a. resolved post weight loss    Type II diabetes mellitus ([place])     Patient Active Problem List   Diagnosis Date Noted   Antibiotic-induced yeast infection 02/13/2021   Diabetic infection of left foot ([place]) 02/03/2021   Right wrist pain 10/05/2019   OSA (obstructive sleep apnea) 06/07/2018   COPD (chronic obstructive pulmonary disease) ([place]) 05/24/2018   Acute on chronic combined systolic and diastolic CHF (congestive heart failure) ([place]) 05/24/2018   Acute respiratory failure with hypoxia ([place]) 05/21/2018   Osteoarthritis 05/25/2017   Atrial tachycardia ([place]) 04/23/2017   Second degree AV block, Mobitz type II    Depression 08/02/2016   Chronic diastolic heart failure ([place]) 08/02/2016   Tobacco abuse 08/02/2016   Right lumbar radiculitis 06/09/2016   Tear of medial meniscus of right knee, current 04/22/2016   Chronic venous insufficiency 02/21/2015   Hx of gastric bypass 02/20/2015   Cardiac pacemaker in situ - St Jude 05/13/2014   Atrioventricular block, complete ([place]) 01/02/2014   Chronic anticoagulation 10/09/2013  PAF (paroxysmal atrial fibrillation) ([place]) 08/29/2013   Obesity 05/05/2013   CAD in native artery    Diabetes mellitus with complication (HCC)    Hypothyroidism    Hypertension    Colon cancer ([place])    Hyperlipidemia associated with type 2 diabetes mellitus [facility])     Past Surgical History:  Procedure Laterality Date   CARDIAC CATHETERIZATION  2006   Never had PCI, 3 caths total. Last one in [place] N/A 02/04/2015   Procedure: Left Heart Cath and Coronary Angiography;  Surgeon: Belva Crome, MD;  Location: [place] [place] CV LAB;  Service: Cardiovascular;  Laterality: N/A;   CATARACT  EXTRACTION, BILATERAL Bilateral 2013   "and put stent in my left eye for glaucoma" (06/25/2013)   CHOLECYSTECTOMY  1980's   COLECTOMY  10/2010   Tumor removal   EYE MUSCLE SURGERY Bilateral ~ 1963   "muscles too long; eyes would go out and up; tied muscles to hold my eyes straight" (06/25/2013)   LEAD REVISION  01-03-2014   atrial lead revision by Dr Rayann Heman   LEAD REVISION N/A 01/03/2014   Procedure: LEAD REVISION;  Surgeon: Coralyn Mark, MD;  Location: [facility] CATH LAB;  Service: Cardiovascular;  Laterality: N/A;   LEFT HEART CATH AND CORONARY ANGIOGRAPHY N/A 05/29/2018   Procedure: LEFT HEART CATH AND CORONARY ANGIOGRAPHY;  Surgeon: Belva Crome, MD;  Location: [place] [facility] LAB;  Service: Cardiovascular;  Laterality: N/A;   PACEMAKER INSERTION  01/02/2014   STJ Assurity dual chamber pacemaker implnated by Dr Lovena Le for SSS   PERMANENT PACEMAKER INSERTION N/A 01/02/2014   Procedure: PERMANENT PACEMAKER INSERTION;  Surgeon: Evans Lance, MD;  Location: [facility] CATH LAB;  Service: Cardiovascular;  Laterality: N/A;   PORTACATH PLACEMENT Left 11/2010   ROUX-EN-Y GASTRIC BYPASS  2009   ULNAR TUNNEL RELEASE Left ~ 4656   UMBILICAL HERNIA REPAIR  1970's?    (06/25/2013)     OB History   No obstetric history on file.     Family History  Problem Relation Age of Onset   Breast cancer Mother    Diabetes Mother    Heart disease Father    Heart attack Father    Heart attack Sister    Heart attack Brother    Bladder Cancer Brother    Prostate cancer Brother    Colon polyps Brother    Breast cancer Sister    Breast cancer Maternal Aunt        x 2 aunts   Pancreatic cancer Brother    Colon polyps Brother    Bladder Cancer Cousin    Clotting disorder Sister    Diabetes Sister        x 3   Diabetes Maternal Grandmother     Social History   Tobacco Use   Smoking status: Former    Packs/day: 1.00    Years: 30.00    Pack years: 30.00    Types: Cigarettes    Quit date: 02/2017    Years  since quitting: 4.2   Smokeless tobacco: Never   Tobacco comments:    started in her [age] until 2009   Vaping Use   Vaping Use: Never used  Substance Use Topics   Alcohol use: Not Currently    Alcohol/week: 0.0 standard drinks   Drug use: No    Home Medications Prior to Admission medications   Medication Sig Start Date End Date Taking? Authorizing Provider  atorvastatin (LIPITOR) 80 MG tablet  TAKE 1 TABLET(80 MG) BY MOUTH DAILY 09/17/19   Hoyt Koch, MD  bimatoprost (LUMIGAN) 0.01 % SOLN Place 1 drop into the right eye at bedtime.     [provider]  buPROPion (WELLBUTRIN XL) 150 MG 24 hr tablet TAKE 1 TABLET BY MOUTH EVERY DAY 03/23/21   Hoyt Koch, MD  ELIQUIS 5 MG TABS tablet TAKE 1 TABLET BY MOUTH TWICE A DAY 11/03/20   Belva Crome, MD  HYDROcodone-acetaminophen (NORCO/VICODIN) 5-325 MG tablet Take 1 tablet by mouth every 6 (six) hours as needed. 06/06/21   Drenda Freeze, MD  insulin glargine (LANTUS) 100 UNIT/ML injection Inject into the skin daily. Sliding Scale    [provider]  levothyroxine (SYNTHROID) 137 MCG tablet Take 137 mcg by mouth every morning. 11/11/20   [provider]  metoprolol succinate (TOPROL-XL) 25 MG 24 hr tablet TAKE 1/2 TABLETS AT BEDTIME 01/06/21   Belva Crome, MD  nitroGLYCERIN (NITROSTAT) 0.4 MG SL tablet Place 1 tablet (0.4 mg total) under the tongue every 5 (five) minutes as needed for chest pain. 04/09/19   Belva Crome, MD  Potassium Chloride ER 20 MEQ TBCR TAKE 2 TABLETS BY MOUTH TWICE A DAY 04/13/21   Belva Crome, MD  spironolactone (ALDACTONE) 25 MG tablet Take 0.5 tablets (12.5 mg total) by mouth daily. 01/06/21   Belva Crome, MD  torsemide (DEMADEX) 20 MG tablet Take 20 mg by mouth 2 (two) times daily.    [provider]  Vitamin D, Ergocalciferol, (DRISDOL) 1.25 MG (50000 UNIT) CAPS capsule Take 50,000 Units by mouth every 7 (seven) days. Patient not taking: Reported on  04/30/2021    [provider]    Allergies    Other, Adhesive [tape], Aspirin, Latex, and Nsaids  Review of Systems   Review of Systems  Constitutional:  Negative for chills and fever.  HENT:  Negative for congestion.   Eyes:  Negative for visual disturbance.  Respiratory:  Negative for cough and shortness of breath.   Cardiovascular:  Negative for chest pain.  Gastrointestinal:  Negative for abdominal pain, nausea and vomiting.  Genitourinary:  Negative for dysuria.  Musculoskeletal:  Positive for back pain and neck pain.  Neurological:  Positive for headaches.  Psychiatric/Behavioral:  Negative for confusion.   All other systems reviewed and are negative.  Physical Exam Updated Vital Signs BP 110/74 (BP Location: Left Arm)   Pulse 70   Temp 98.2 F (36.8 C) (Oral)   Resp 16   Ht 5\' 7"  (1.702 m)   Wt 99.8 kg   SpO2 99%   BMI 34.46 kg/m   Physical Exam Vitals and nursing note reviewed.  Constitutional:      General: She is not in acute distress.    Appearance: She is not ill-appearing or diaphoretic.  HENT:     Head: Normocephalic.     Comments: No raccoon's eyes or battle signs bilaterally. Eyes:     General: No scleral icterus.       Right eye: No discharge.        Left eye: No discharge.     Conjunctiva/sclera: Conjunctivae normal.  Neck:     Comments: After CT scan resulted ROM is tested and is normal Cardiovascular:     Rate and Rhythm: Normal rate. Rhythm irregular.     Pulses: Normal pulses.     Heart sounds: Normal heart sounds.  Pulmonary:     Effort: Pulmonary effort is normal. No  respiratory distress.     Breath sounds: No stridor.  Abdominal:     General: There is no distension.  Musculoskeletal:        General: No deformity.     Comments: 5/5 grip strength bilaterally.  She is able to lift both arms without pain or difficulty.  She is able to lift both legs off the bed without significant pain. No pain with internal and external rotation  of hips bilaterally.   There is tenderness to palpation over midline and bilateral over posterior rib/back T-spine. No step-offs or deformities palpated.  Skin:    General: Skin is warm and dry.  Neurological:     Mental Status: She is alert. Mental status is at baseline.     Cranial Nerves: No cranial nerve deficit.     Sensory: No sensory deficit.     Motor: No abnormal muscle tone.     Comments: Awake and alert, answers all questions appropriately.  Speech is not slurred.    Psychiatric:        Mood and Affect: Mood normal.        Behavior: Behavior normal.    ED Results / Procedures / Treatments   Labs (all labs ordered are listed, but only abnormal results are displayed) Labs Reviewed  CBC WITH DIFFERENTIAL/PLATELET - Abnormal; Notable for the following components:      Result Value   Hemoglobin 10.8 (*)    HCT 34.7 (*)    Platelets 460 (*)    All other components within normal limits  COMPREHENSIVE METABOLIC PANEL - Abnormal; Notable for the following components:   Sodium 131 (*)    Chloride 92 (*)    Glucose, Bld 197 (*)    BUN 25 (*)    Creatinine, Ser 1.33 (*)    Total Protein 8.4 (*)    AST 14 (*)    GFR, Estimated 41 (*)    All other components within normal limits    EKG None  Radiology DG Thoracic Spine 2 View  Result Date: 06/06/2021 CLINICAL DATA:  [place] 2 days ago, pain EXAM: THORACIC SPINE 2 VIEWS COMPARISON:  Chest radiograph 02/12/2019 FINDINGS: Pacemaker leads via LEFT subclavian approach project over RIGHT atrium and RIGHT ventricle. Atherosclerotic calcification aorta. 12 pairs of ribs. Questionable subtle superior endplate height loss of an upper thoracic vertebra, area not well demonstrated on prior radiographs. No additional fracture or subluxation. IMPRESSION: Questionable subtle superior endplate height loss of an upper thoracic vertebra on lateral view, unable to exclude subtle superior endplate compression fracture. Electronically Signed   By:  Lavonia Dana M.D.   On: 06/06/2021 15:20   DG Lumbar Spine Complete  Result Date: 06/06/2021 CLINICAL DATA:  Fall, pain EXAM: LUMBAR SPINE - COMPLETE 4+ VIEW COMPARISON:  None FINDINGS: 5 non-rib-bearing lumbar vertebra. Osseous demineralization. Multilevel disc space narrowing and endplate spur formation. Levoconvex lumbar scoliosis. No fracture or bone destruction. Mild retrolisthesis L4-L5. Scattered facet degenerative changes. SI joints preserved. Atherosclerotic calcifications aorta and iliac arteries. Calcified uterine leiomyomata in pelvis. IMPRESSION: Degenerative disc/facet disease changes lumbar spine with levoconvex scoliosis as above. No acute osseous abnormalities. Calcified uterine leiomyomata in pelvis. Aortic Atherosclerosis (ICD10-I70.0). Electronically Signed   By: Lavonia Dana M.D.   On: 06/06/2021 15:21   DG Pelvis 1-2 Views  Result Date: 06/06/2021 CLINICAL DATA:  Pain, fall EXAM: PELVIS - 1-2 VIEW COMPARISON:  None FINDINGS: Calcified uterine leiomyomata in pelvis as well as multiple calcified pelvic phleboliths and scattered atherosclerotic calcifications. Osseous  demineralization normal. Hip and SI joint spaces preserved. No fracture, dislocation, or bone destruction. IMPRESSION: No acute osseous abnormalities. Calcified uterine leiomyomata. Electronically Signed   By: Lavonia Dana M.D.   On: 06/06/2021 15:22   CT Head Wo Contrast  Result Date: 06/06/2021 CLINICAL DATA:  Head trauma, minor (Age >= 65y) Fall 2 days ago, struck back of her head, anticoagulated with Eliquis EXAM: CT HEAD WITHOUT CONTRAST TECHNIQUE: Contiguous axial images were obtained from the base of the skull through the vertex without intravenous contrast. COMPARISON:  03/18/2019 head CT FINDINGS: Brain: Generalized cerebral volume loss. Small chronic left basal ganglia lacune. Nonspecific prominent subcortical and periventricular white matter hypodensity, most in keeping with chronic small vessel ischemic change.  No evidence of parenchymal hemorrhage or extra-axial fluid collection. No mass lesion, mass effect, or midline shift. No CT evidence of acute infarction. Stable ventricles with mildly asymmetrically prominent right lateral ventricle. No acute ventriculomegaly. Vascular: No acute abnormality. Skull: No evidence of calvarial fracture. Sinuses/Orbits: The visualized paranasal sinuses are essentially clear. Other:  The mastoid air cells are unopacified. IMPRESSION: 1. No evidence of acute intracranial abnormality. No evidence of calvarial fracture. 2. Generalized cerebral volume loss and prominent chronic small vessel ischemic changes in the cerebral white matter. 3. Small chronic left basal ganglia lacune. Electronically Signed   By: Ilona Sorrel M.D.   On: 06/06/2021 15:05   CT Cervical Spine Wo Contrast  Result Date: 06/06/2021 CLINICAL DATA:  Status post fall backwards. EXAM: CT CERVICAL SPINE WITHOUT CONTRAST TECHNIQUE: Multidetector CT imaging of the cervical spine was performed without intravenous contrast. Multiplanar CT image reconstructions were also generated. COMPARISON:  None. FINDINGS: Alignment: Normal. Skull base and vertebrae: Degenerative changes at the C6-C7 level. No associated severe osseous neural foraminal or central canal stenosis. No acute fracture. No aggressive appearing focal osseous lesion or focal pathologic process. Soft tissues and spinal canal: No prevertebral fluid or swelling. No visible canal hematoma. Upper chest: Unremarkable. Other: Mild-to-moderate atherosclerotic plaque. IMPRESSION: No acute displaced fracture or traumatic listhesis of the cervical spine. Electronically Signed   By: Iven Finn M.D.   On: 06/06/2021 15:03   CT CHEST ABDOMEN PELVIS W CONTRAST  Result Date: 06/06/2021 CLINICAL DATA:  Status post fall with back pain. Abdominal trauma EXAM: CT CHEST, ABDOMEN, AND PELVIS WITH CONTRAST TECHNIQUE: Multidetector CT imaging of the chest, abdomen and pelvis was  performed following the standard protocol during bolus administration of intravenous contrast. CONTRAST:  137mL OMNIPAQUE IOHEXOL 300 MG/ML  SOLN COMPARISON:  CT of the abdomen June 11, 2014 FINDINGS: CT CHEST FINDINGS Cardiovascular: Normal heart size. No pericardial effusion. Cardiac pacemaker in place. Calcific atherosclerotic disease of the coronary arteries and aorta. Mediastinum/Nodes: No enlarged mediastinal, hilar, or axillary lymph nodes. Thyroid gland, trachea, and esophagus demonstrate no significant findings. Lungs/Pleura: Lungs are clear. No pleural effusion or pneumothorax. Musculoskeletal: No evidence of fracture. CT ABDOMEN PELVIS FINDINGS Hepatobiliary: No focal liver abnormality is seen. Status post cholecystectomy. No biliary dilatation. Pancreas: Unremarkable. No pancreatic ductal dilatation or surrounding inflammatory changes. Spleen: Normal in size without focal abnormality. Adrenals/Urinary Tract: Adrenal glands are unremarkable. Kidneys are normal, without renal calculi, focal lesion, or hydronephrosis. Bladder is unremarkable. Stomach/Bowel: Post gastric bypass. No evidence of small-bowel obstruction, thickening or distension. Vascular/Lymphatic: Aortic atherosclerosis. No enlarged abdominal or pelvic lymph nodes. Reproductive: Leiomyomatous uterus. Other: Wide neck periumbilical bowel containing abdominal wall hernia without evidence of incarceration. Musculoskeletal: Levoconvex scoliosis of the thoracolumbar spine with secondary osteoarthritic changes mostly affecting L2-L3.  No evidence of acute fracture. IMPRESSION: 1. No evidence of acute traumatic injury to the chest, abdomen or pelvis. 2. Wide neck periumbilical bowel containing abdominal wall hernia without evidence of incarceration. 3. Leiomyomatous uterus. 4. Levoconvex scoliosis of the thoracolumbar spine with secondary osteoarthritic changes mostly affecting L2-L3. 5. No evidence of acute traumatic injury to the thoracic or  lumbosacral spine. Aortic Atherosclerosis (ICD10-I70.0). Electronically Signed   By: Fidela Salisbury M.D.   On: 06/06/2021 19:17   CT T-SPINE NO CHARGE  Result Date: 06/06/2021 CLINICAL DATA:  Status post fall with back pain. Abdominal trauma EXAM: CT CHEST, ABDOMEN, AND PELVIS WITH CONTRAST TECHNIQUE: Multidetector CT imaging of the chest, abdomen and pelvis was performed following the standard protocol during bolus administration of intravenous contrast. CONTRAST:  120mL OMNIPAQUE IOHEXOL 300 MG/ML  SOLN COMPARISON:  CT of the abdomen June 11, 2014 FINDINGS: CT CHEST FINDINGS Cardiovascular: Normal heart size. No pericardial effusion. Cardiac pacemaker in place. Calcific atherosclerotic disease of the coronary arteries and aorta. Mediastinum/Nodes: No enlarged mediastinal, hilar, or axillary lymph nodes. Thyroid gland, trachea, and esophagus demonstrate no significant findings. Lungs/Pleura: Lungs are clear. No pleural effusion or pneumothorax. Musculoskeletal: No evidence of fracture. CT ABDOMEN PELVIS FINDINGS Hepatobiliary: No focal liver abnormality is seen. Status post cholecystectomy. No biliary dilatation. Pancreas: Unremarkable. No pancreatic ductal dilatation or surrounding inflammatory changes. Spleen: Normal in size without focal abnormality. Adrenals/Urinary Tract: Adrenal glands are unremarkable. Kidneys are normal, without renal calculi, focal lesion, or hydronephrosis. Bladder is unremarkable. Stomach/Bowel: Post gastric bypass. No evidence of small-bowel obstruction, thickening or distension. Vascular/Lymphatic: Aortic atherosclerosis. No enlarged abdominal or pelvic lymph nodes. Reproductive: Leiomyomatous uterus. Other: Wide neck periumbilical bowel containing abdominal wall hernia without evidence of incarceration. Musculoskeletal: Levoconvex scoliosis of the thoracolumbar spine with secondary osteoarthritic changes mostly affecting L2-L3. No evidence of acute fracture. IMPRESSION: 1.  No evidence of acute traumatic injury to the chest, abdomen or pelvis. 2. Wide neck periumbilical bowel containing abdominal wall hernia without evidence of incarceration. 3. Leiomyomatous uterus. 4. Levoconvex scoliosis of the thoracolumbar spine with secondary osteoarthritic changes mostly affecting L2-L3. 5. No evidence of acute traumatic injury to the thoracic or lumbosacral spine. Aortic Atherosclerosis (ICD10-I70.0). Electronically Signed   By: Fidela Salisbury M.D.   On: 06/06/2021 19:17   CT L-SPINE NO CHARGE  Result Date: 06/06/2021 CLINICAL DATA:  Status post fall with back pain. Abdominal trauma EXAM: CT CHEST, ABDOMEN, AND PELVIS WITH CONTRAST TECHNIQUE: Multidetector CT imaging of the chest, abdomen and pelvis was performed following the standard protocol during bolus administration of intravenous contrast. CONTRAST:  14mL OMNIPAQUE IOHEXOL 300 MG/ML  SOLN COMPARISON:  CT of the abdomen June 11, 2014 FINDINGS: CT CHEST FINDINGS Cardiovascular: Normal heart size. No pericardial effusion. Cardiac pacemaker in place. Calcific atherosclerotic disease of the coronary arteries and aorta. Mediastinum/Nodes: No enlarged mediastinal, hilar, or axillary lymph nodes. Thyroid gland, trachea, and esophagus demonstrate no significant findings. Lungs/Pleura: Lungs are clear. No pleural effusion or pneumothorax. Musculoskeletal: No evidence of fracture. CT ABDOMEN PELVIS FINDINGS Hepatobiliary: No focal liver abnormality is seen. Status post cholecystectomy. No biliary dilatation. Pancreas: Unremarkable. No pancreatic ductal dilatation or surrounding inflammatory changes. Spleen: Normal in size without focal abnormality. Adrenals/Urinary Tract: Adrenal glands are unremarkable. Kidneys are normal, without renal calculi, focal lesion, or hydronephrosis. Bladder is unremarkable. Stomach/Bowel: Post gastric bypass. No evidence of small-bowel obstruction, thickening or distension. Vascular/Lymphatic: Aortic  atherosclerosis. No enlarged abdominal or pelvic lymph nodes. Reproductive: Leiomyomatous uterus. Other: Wide neck periumbilical bowel containing  abdominal wall hernia without evidence of incarceration. Musculoskeletal: Levoconvex scoliosis of the thoracolumbar spine with secondary osteoarthritic changes mostly affecting L2-L3. No evidence of acute fracture. IMPRESSION: 1. No evidence of acute traumatic injury to the chest, abdomen or pelvis. 2. Wide neck periumbilical bowel containing abdominal wall hernia without evidence of incarceration. 3. Leiomyomatous uterus. 4. Levoconvex scoliosis of the thoracolumbar spine with secondary osteoarthritic changes mostly affecting L2-L3. 5. No evidence of acute traumatic injury to the thoracic or lumbosacral spine. Aortic Atherosclerosis (ICD10-I70.0). Electronically Signed   By: Fidela Salisbury M.D.   On: 06/06/2021 19:17    Procedures Procedures   Medications Ordered in ED Medications  sodium chloride 0.9 % bolus 1,000 mL (0 mLs Intravenous Stopped 06/06/21 1839)  morphine 4 MG/ML injection 4 mg (4 mg Intravenous Given 06/06/21 1713)  iohexol (OMNIPAQUE) 300 MG/ML solution 100 mL (100 mLs Intravenous Contrast Given 06/06/21 1754)    ED Course  I have reviewed the triage vital signs and the nursing notes.  Pertinent labs & imaging results that were available during my care of the patient were reviewed by me and considered in my medical decision making (see chart for details).  Clinical Course as of 06/07/21 2313  Sat Jun 06, 2021  1509 CT Head Wo Contrast No intracranial hemorrhage. [EH]    Clinical Course User Index [EH] Ollen Gross   MDM Rules/Calculators/A&P                          Patient is a 80 year old who presents today for evaluation after a fall that occurred 3 days ago.  She is anticoagulated. CT head and neck is obtained without fracture or intracranial hemorrhage or other acute abnormalities. Initially she is  evaluated with plain films of T and L-spine, along with x-rays of the pelvis.  These showed concern for a possible thoracic spine fracture. CT chest abdomen pelvis with contrast is obtained along with recons of T and L-spine which did not show any acute abnormalities or evidence of fracture/significant traumatic injury.  Labs were obtained prior to imaging, she is slightly anemic which is consistent with her baseline.  CMP is consistent with her baseline.  She is given IV fluids and morphine.  She did not pass out or have chest pain,  After this patient was able to ambulate.  She was seen by myself and Dr. Darl Householder, patient will be discharged, please see his note for further details.   Note: Portions of this report may have been transcribed using voice recognition software. Every effort was made to ensure accuracy; however, inadvertent computerized transcription errors may be present   Final Clinical Impression(s) / ED Diagnoses Final diagnoses:  Back pain  Fall, initial encounter    Rx / DC Orders ED Discharge Orders          Ordered    HYDROcodone-acetaminophen (NORCO/VICODIN) 5-325 MG tablet  Every 6 hours PRN,   Status:  Discontinued        06/06/21 1956    HYDROcodone-acetaminophen (NORCO/VICODIN) 5-325 MG tablet  Every 6 hours PRN        06/06/21 1957             Lorin Glass, PA-C 06/07/21 2316    Drenda Freeze, MD 06/08/21 [phone]

## 2021-06-06 NOTE — ED Notes (Signed)
Pt NAD, a/ox4. Pt verbalizes understanding of all DC and f/u instructions. All questions answered. Pt walks with steady gait to lobby at DC with spouse at side.   

## 2021-06-06 NOTE — Discharge Instructions (Signed)
Today you received medications that may make you sleepy or impair your ability to make decisions.  For the next 24 hours please do not drive, operate heavy machinery, care for a small child with out another adult present, or perform any activities that may cause harm to you or someone else if you were to fall asleep or be impaired.   You are being prescribed a medication which may make you sleepy. Please follow up of listed precautions for at least 24 hours after taking one dose.

## 2021-06-09 ENCOUNTER — Encounter: Payer: Self-pay | Admitting: Internal Medicine

## 2021-06-12 ENCOUNTER — Ambulatory Visit: Payer: Medicare Other | Admitting: Internal Medicine

## 2021-06-12 ENCOUNTER — Other Ambulatory Visit: Payer: Self-pay

## 2021-06-12 ENCOUNTER — Encounter: Payer: Self-pay | Admitting: Internal Medicine

## 2021-06-12 VITALS — BP 126/82 | HR 74 | Temp 98.1°F | Ht 67.0 in

## 2021-06-12 DIAGNOSIS — Z720 Tobacco use: Secondary | ICD-10-CM | POA: Diagnosis not present

## 2021-06-12 DIAGNOSIS — M159 Polyosteoarthritis, unspecified: Secondary | ICD-10-CM

## 2021-06-12 DIAGNOSIS — Z23 Encounter for immunization: Secondary | ICD-10-CM | POA: Diagnosis not present

## 2021-06-12 MED ORDER — HYDROCODONE-ACETAMINOPHEN 5-325 MG PO TABS: 1.0000 | ORAL_TABLET | Freq: Four times a day (QID) | ORAL | 0 refills | Status: DC | PRN

## 2021-06-12 MED ORDER — BACLOFEN 10 MG PO TABS: 10.0000 mg | ORAL_TABLET | Freq: Every day | ORAL | 0 refills | Status: DC | PRN

## 2021-06-12 NOTE — Assessment & Plan Note (Signed)
Overall severe flare due to fall recently. Imaging and notes reviewed from the ER and discussed with patient and husband. She does use hydrocodone rarely and we prescribe #30 for her every 2-3 months. She is overdue for refill and advised she can contact office for refill if needed. Rx hydrocodone 5/325 mg #30 no refills today for pain control. Additional rx for baclofen 10 mg daily prn for muscle spasms as she has moderate muscle spasms in the evening.

## 2021-06-12 NOTE — Assessment & Plan Note (Signed)
They are working on cutting back and encouraged to try to stop altogether. Reminded about harms from cigarette smoking.

## 2021-06-12 NOTE — Patient Instructions (Addendum)
We have sent in a refill of the hydrocodone.  We have sent in baclofen to use for muscle cramps daily as needed.

## 2021-06-12 NOTE — Progress Notes (Signed)
   Subjective:   Patient ID: Mary [name], female    DOB: 12/18/1940, 80 y.o.   MRN: [id number]  HPI The patient is a 80 YO female coming in for ER follow up (records and labs reviewed with fall and CT imaging no fracture although x-ray was suggestive, labs stable from baseline mild worsening Hg levels). She is feeling sore still and has chronic pain as well.  PMH, [facility], social history reviewed and updated  Review of Systems  Constitutional: Negative.   HENT: Negative.    Eyes: Negative.   Respiratory:  Negative for cough, chest tightness and shortness of breath.   Cardiovascular:  Negative for chest pain, palpitations and leg swelling.  Gastrointestinal:  Negative for abdominal distention, abdominal pain, constipation, diarrhea, nausea and vomiting.  Musculoskeletal:  Positive for arthralgias, back pain, gait problem and myalgias.  Skin: Negative.   Psychiatric/Behavioral: Negative.     Objective:  Physical Exam Constitutional:      Appearance: She is well-developed.  HENT:     Head: Normocephalic and atraumatic.  Cardiovascular:     Rate and Rhythm: Normal rate and regular rhythm.  Pulmonary:     Effort: Pulmonary effort is normal. No respiratory distress.     Breath sounds: Normal breath sounds. No wheezing or rales.  Abdominal:     General: Bowel sounds are normal. There is no distension.     Palpations: Abdomen is soft.     Tenderness: There is no abdominal tenderness. There is no rebound.  Musculoskeletal:        General: Tenderness present.     Cervical back: Normal range of motion.  Skin:    General: Skin is warm and dry.  Neurological:     Mental Status: She is alert and oriented to person, place, and time.     Coordination: Coordination abnormal.     Comments: wheelchair    Vitals:   06/12/21 1603  BP: 126/82  Pulse: 74  Temp: 98.1 F (36.7 C)  TempSrc: Oral  SpO2: 98%  Height: 5\' 7"  (1.702 m)    This visit occurred during the SARS-CoV-2 public health  emergency.  Safety protocols were in place, including screening questions prior to the visit, additional usage of staff PPE, and extensive cleaning of exam room while observing appropriate contact time as indicated for disinfecting solutions.   Assessment & Plan:  Flu shot given at visit  Visit time 25 minutes in face to face communication with patient and coordination of care, additional 10 minutes spent in record review, coordination or care, ordering tests, communicating/referring to other healthcare professionals, documenting in medical records all on the same day of the visit for total time 35 minutes spent on the visit.

## 2021-06-22 ENCOUNTER — Ambulatory Visit (INDEPENDENT_AMBULATORY_CARE_PROVIDER_SITE_OTHER): Payer: Medicare Other

## 2021-06-22 DIAGNOSIS — I441 Atrioventricular block, second degree: Secondary | ICD-10-CM

## 2021-06-23 LAB — CUP PACEART REMOTE DEVICE CHECK
Battery Remaining Longevity: 11 mo
Battery Remaining Percentage: 13 %
Battery Voltage: 2.86 V
Brady Statistic AP VP Percent: 99 %
Brady Statistic AP VS Percent: 1 %
Brady Statistic AS VP Percent: 1 %
Brady Statistic AS VS Percent: 0 %
Brady Statistic RA Percent Paced: 99 %
Brady Statistic RV Percent Paced: 99 %
Date Time Interrogation Session: 20221025015012
Implantable Lead Implant Date: 20150506
Implantable Lead Implant Date: 20150506
Implantable Lead Location: 753859
Implantable Lead Location: 753860
Implantable Pulse Generator Implant Date: 20150506
Lead Channel Impedance Value: 360 Ohm
Lead Channel Impedance Value: 400 Ohm
Lead Channel Pacing Threshold Amplitude: 0.75 V
Lead Channel Pacing Threshold Amplitude: 0.75 V
Lead Channel Pacing Threshold Pulse Width: 0.4 ms
Lead Channel Pacing Threshold Pulse Width: 0.4 ms
Lead Channel Sensing Intrinsic Amplitude: 0.5 mV
Lead Channel Sensing Intrinsic Amplitude: 5.8 mV
Lead Channel Setting Pacing Amplitude: 2.5 V
Lead Channel Setting Pacing Amplitude: 2.5 V
Lead Channel Setting Pacing Pulse Width: 0.4 ms
Lead Channel Setting Sensing Sensitivity: 4 mV
Pulse Gen Model: 2240
Pulse Gen Serial Number: 3013730

## 2021-06-25 ENCOUNTER — Telehealth: Payer: Self-pay | Admitting: Internal Medicine

## 2021-06-25 ENCOUNTER — Other Ambulatory Visit: Payer: Self-pay

## 2021-06-25 MED ORDER — SPIRONOLACTONE 25 MG PO TABS: 12.5000 mg | ORAL_TABLET | Freq: Every day | ORAL | 3 refills | Status: DC

## 2021-06-25 MED ORDER — TORSEMIDE 20 MG PO TABS: 20.0000 mg | ORAL_TABLET | Freq: Two times a day (BID) | ORAL | 11 refills | Status: DC

## 2021-06-25 NOTE — Telephone Encounter (Signed)
1.Medication Requested: Vitamin D, Ergocalciferol, (DRISDOL) 1.25 MG (50000 UNIT) CAPS capsule  2. Pharmacy (Name, [place]):  [facility] [id number] - [place], [place]  Phone:  [phone] Fax:  [phone]   3. On Med List: yes  4. Last Visit with PCP: 10.14.22  5. Next visit date with PCP: n/a   Agent: Please be advised that RX refills may take up to 3 business days. We ask that you follow-up with your pharmacy.

## 2021-06-26 MED ORDER — VITAMIN D (ERGOCALCIFEROL) 1.25 MG (50000 UNIT) PO CAPS: 50000.0000 [IU] | ORAL_CAPSULE | ORAL | 0 refills | Status: DC

## 2021-06-26 NOTE — Telephone Encounter (Signed)
Okay to refill for #12 no refills.

## 2021-06-26 NOTE — Telephone Encounter (Signed)
Refill has been sent to her pharmacy

## 2021-06-26 NOTE — Telephone Encounter (Signed)
Ok to refill? Wasn't originally prescribed by you.

## 2021-06-30 NOTE — Progress Notes (Signed)
Remote pacemaker transmission.   

## 2021-07-02 ENCOUNTER — Ambulatory Visit (INDEPENDENT_AMBULATORY_CARE_PROVIDER_SITE_OTHER): Payer: Medicare Other | Admitting: Internal Medicine

## 2021-07-02 ENCOUNTER — Encounter: Payer: Self-pay | Admitting: Internal Medicine

## 2021-07-02 ENCOUNTER — Other Ambulatory Visit: Payer: Self-pay

## 2021-07-02 VITALS — BP 120/70 | HR 72 | Resp 18 | Ht 67.0 in | Wt 211.2 lb

## 2021-07-02 DIAGNOSIS — E039 Hypothyroidism, unspecified: Secondary | ICD-10-CM | POA: Diagnosis not present

## 2021-07-02 DIAGNOSIS — I10 Essential (primary) hypertension: Secondary | ICD-10-CM

## 2021-07-02 DIAGNOSIS — E118 Type 2 diabetes mellitus with unspecified complications: Secondary | ICD-10-CM

## 2021-07-02 DIAGNOSIS — I5032 Chronic diastolic (congestive) heart failure: Secondary | ICD-10-CM | POA: Diagnosis not present

## 2021-07-02 DIAGNOSIS — E538 Deficiency of other specified B group vitamins: Secondary | ICD-10-CM

## 2021-07-02 LAB — COMPREHENSIVE METABOLIC PANEL
ALT: 16 U/L (ref 0–35)
AST: 20 U/L (ref 0–37)
Albumin: 4.1 g/dL (ref 3.5–5.2)
Alkaline Phosphatase: 109 U/L (ref 39–117)
BUN: 38 mg/dL — ABNORMAL HIGH (ref 6–23)
CO2: 28 mEq/L (ref 19–32)
Calcium: 9.2 mg/dL (ref 8.4–10.5)
Chloride: 94 mEq/L — ABNORMAL LOW (ref 96–112)
Creatinine, Ser: 1.61 mg/dL — ABNORMAL HIGH (ref 0.40–1.20)
GFR: 30.24 mL/min — ABNORMAL LOW (ref >60.00)
Glucose, Bld: 183 mg/dL — ABNORMAL HIGH (ref 70–99)
Potassium: 4.4 mEq/L (ref 3.5–5.1)
Sodium: 131 mEq/L — ABNORMAL LOW (ref 135–145)
Total Bilirubin: 0.5 mg/dL (ref 0.2–1.2)
Total Protein: 7.9 g/dL (ref 6.0–8.3)

## 2021-07-02 LAB — HEMOGLOBIN A1C: Hgb A1c MFr Bld: 12.8 % — ABNORMAL HIGH (ref 4.6–6.5)

## 2021-07-02 LAB — CBC
HCT: 33.7 % — ABNORMAL LOW (ref 36.0–46.0)
Hemoglobin: 10.7 g/dL — ABNORMAL LOW (ref 12.0–15.0)
MCHC: 31.6 g/dL (ref 30.0–36.0)
MCV: 79.6 fl (ref 78.0–100.0)
Platelets: 423 10*3/uL — ABNORMAL HIGH (ref 150.0–400.0)
RBC: 4.24 Mil/uL (ref 3.87–5.11)
RDW: 15.4 % (ref 11.5–15.5)
WBC: 6.4 10*3/uL (ref 4.0–10.5)

## 2021-07-02 LAB — T4, FREE: Free T4: 1.05 ng/dL (ref 0.60–1.60)

## 2021-07-02 LAB — TSH: TSH: 14.02 u[IU]/mL — ABNORMAL HIGH (ref 0.35–5.50)

## 2021-07-02 MED ORDER — CYANOCOBALAMIN 1000 MCG/ML IJ SOLN: 1000.0000 ug | INTRAMUSCULAR | 0 refills | Status: DC

## 2021-07-02 NOTE — Progress Notes (Signed)
   Subjective:   Patient ID: Mary [name], female    DOB: 1941/04/20, 80 y.o.   MRN: [id number]  HPI The patient is a 80 YO female coming in for possible recent mini-stroke.   Review of Systems  Constitutional:  Positive for activity change, appetite change and fatigue.  HENT: Negative.    Eyes: Negative.   Respiratory:  Negative for cough, chest tightness and shortness of breath.   Cardiovascular:  Negative for chest pain, palpitations and leg swelling.  Gastrointestinal:  Negative for abdominal distention, abdominal pain, constipation, diarrhea, nausea and vomiting.  Musculoskeletal:  Positive for arthralgias, gait problem and myalgias.  Skin: Negative.   Neurological:  Positive for weakness and numbness.  Psychiatric/Behavioral: Negative.     Objective:  Physical Exam Constitutional:      Appearance: She is well-developed. She is obese.  HENT:     Head: Normocephalic and atraumatic.  Cardiovascular:     Rate and Rhythm: Normal rate and regular rhythm.  Pulmonary:     Effort: Pulmonary effort is normal. No respiratory distress.     Breath sounds: Normal breath sounds. No wheezing or rales.  Abdominal:     General: Bowel sounds are normal. There is no distension.     Palpations: Abdomen is soft.     Tenderness: There is no abdominal tenderness. There is no rebound.  Musculoskeletal:        General: Tenderness present.     Cervical back: Normal range of motion.  Skin:    General: Skin is warm and dry.  Neurological:     Mental Status: She is alert and oriented to person, place, and time. Mental status is at baseline.     Coordination: Coordination abnormal.    Vitals:   07/02/21 1013  BP: 120/70  Pulse: 72  Resp: 18  SpO2: 97%  Weight: 211 lb 3.2 oz (95.8 kg)  Height: 5\' 7"  (1.702 m)    This visit occurred during the SARS-CoV-2 public health emergency.  Safety protocols were in place, including screening questions prior to the visit, additional usage of staff  PPE, and extensive cleaning of exam room while observing appropriate contact time as indicated for disinfecting solutions.   Assessment & Plan:  Visit time 25 minutes in face to face communication with patient and coordination of care, additional 15 minutes spent in record review, coordination or care, ordering tests, communicating/referring to other healthcare professionals, documenting in medical records all on the same day of the visit for total time 40 minutes spent on the visit.

## 2021-07-02 NOTE — Patient Instructions (Addendum)
We have sent in the B12 today to do the shots at home.  We will check the labs today.

## 2021-07-03 ENCOUNTER — Ambulatory Visit (INDEPENDENT_AMBULATORY_CARE_PROVIDER_SITE_OTHER): Payer: Medicare Other | Admitting: Podiatry

## 2021-07-03 ENCOUNTER — Other Ambulatory Visit: Payer: Self-pay | Admitting: Internal Medicine

## 2021-07-03 DIAGNOSIS — E118 Type 2 diabetes mellitus with unspecified complications: Secondary | ICD-10-CM

## 2021-07-03 DIAGNOSIS — E538 Deficiency of other specified B group vitamins: Secondary | ICD-10-CM | POA: Insufficient documentation

## 2021-07-03 DIAGNOSIS — L97521 Non-pressure chronic ulcer of other part of left foot limited to breakdown of skin: Secondary | ICD-10-CM

## 2021-07-03 MED ORDER — LEVOTHYROXINE SODIUM 150 MCG PO TABS: 150.0000 ug | ORAL_TABLET | Freq: Every day | ORAL | 3 refills | Status: DC

## 2021-07-03 NOTE — Assessment & Plan Note (Signed)
BP at goal today making high BP a cause of her recent episode less likely. Continue metoprolol and torsemide. Checking CMP and adjust as needed.

## 2021-07-03 NOTE — Assessment & Plan Note (Signed)
No evidence for flare today. Checking CBC and CMP.

## 2021-07-03 NOTE — Assessment & Plan Note (Signed)
Checking HgA1c she has had historically poor control and suspect her recent symptoms could have been related to high blood sugar. Severe exacerbation today. Neuropathy which has contributed to several falls this year. Taking lantus and humalog and due to see endocrine in several weeks.

## 2021-07-03 NOTE — Assessment & Plan Note (Signed)
Previously on treatment and would like to resume monthly home injections. Rx sent in for B12 home injections.

## 2021-07-03 NOTE — Assessment & Plan Note (Signed)
Previously poorly controlled with last TSH 20s. She states endocrine dose not monitor so checking TSH and free T4 today. Currently taking synthroid 137 mcg daily and will adjust as needed.

## 2021-07-07 NOTE — Progress Notes (Signed)
Subjective:  Patient ID: Mary [name], female    DOB: 1940-09-10,  MRN: [id number]  Chief Complaint  Patient presents with   Wound Check    Left foot wound check     80 y.o. female presents for wound care.  Patient presents with left first metatarsophalangeal joint wound with last A1c of 12.8%.  She states is limited to breakdown of skin she is having some pain she wanted get it evaluated.  She has not been applying anything on it.  Her sugars have been a little out of control.  She wanted get evaluated.  She denies any other acute issues.   Review of Systems: Negative except as noted in the HPI. Denies N/V/F/Ch.  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 Procedure complicated by an atrial lead dislodgment and repeat procedure, 12/2013    Carotid stenosis    Carotid US 5/16:  Bilateral ICA 1-39%; L vertebral retrograde; L BP 126/49, R BP 140/57   Chronic combined systolic and diastolic CHF (congestive heart failure) (HCC)    Chronic lower back pain    Colon cancer (HCC)    a. s/p chemo   Colon polyps    Diabetic peripheral neuropathy (HCC)    DVT (deep venous thrombosis) ([place]) 2013   "twice behind knee on left side" (06/25/2013)   Glaucoma    History of chicken pox    Hyperlipidemia    Hypertension    Hypothyroidism    Iron deficiency anemia    Multinodular goiter    Osteoporosis    PAF (paroxysmal atrial fibrillation) (HCC)    Sleep apnea    a. resolved post weight loss    Type II diabetes mellitus (HCC)     Current Outpatient Medications:    atorvastatin (LIPITOR) 80 MG tablet, TAKE 1 TABLET(80 MG) BY MOUTH DAILY, Disp: 90 tablet, Rfl: 0   baclofen (LIORESAL) 10 MG tablet, Take 1 tablet (10 mg total) by mouth daily as needed for muscle spasms., Disp: 30 each, Rfl: 0    bimatoprost (LUMIGAN) 0.01 % SOLN, Place 1 drop into the right eye at bedtime. , Disp: , Rfl:    buPROPion (WELLBUTRIN XL) 150 MG 24 hr tablet, TAKE 1 TABLET BY MOUTH EVERY DAY, Disp: 90 tablet, Rfl: 1   cyanocobalamin (,VITAMIN B-12,) 1000 MCG/ML injection, Inject 1 mL (1,000 mcg total) into the muscle every 30 (thirty) days., Disp: 10 mL, Rfl: 0   ELIQUIS 5 MG TABS tablet, TAKE 1 TABLET BY MOUTH TWICE A DAY, Disp: 180 tablet, Rfl: 1   HYDROcodone-acetaminophen (NORCO/VICODIN) 5-325 MG tablet, Take 1 tablet by mouth every 6 (six) hours as needed., Disp: 30 tablet, Rfl: 0   insulin glargine (LANTUS) 100 UNIT/ML injection, Inject into the skin daily. Sliding Scale, Disp: , Rfl:    insulin lispro (HUMALOG) 100 UNIT/ML injection, Inject 20 Units into the skin as needed for high blood sugar. If blood sugar over 200., Disp: , Rfl:    levothyroxine (SYNTHROID) 150 MCG tablet, Take 1 tablet (150 mcg total) by mouth daily., Disp: 90 tablet, Rfl: 3   metoprolol succinate (TOPROL-XL) 25 MG 24 hr tablet, TAKE 1/2 TABLETS AT BEDTIME, Disp: 45 tablet, Rfl: 1   nitroGLYCERIN (NITROSTAT) 0.4 MG SL tablet, Place 1 tablet (0.4 mg total) under  the tongue every 5 (five) minutes as needed for chest pain., Disp: 25 tablet, Rfl: 2   Potassium Chloride ER 20 MEQ TBCR, TAKE 2 TABLETS BY MOUTH TWICE A DAY, Disp: 120 tablet, Rfl: 2   spironolactone (ALDACTONE) 25 MG tablet, Take 0.5 tablets (12.5 mg total) by mouth daily., Disp: 45 tablet, Rfl: 3   torsemide (DEMADEX) 20 MG tablet, Take 1 tablet (20 mg total) by mouth 2 (two) times daily., Disp: 60 tablet, Rfl: 11   Vitamin D, Ergocalciferol, (DRISDOL) 1.25 MG (50000 UNIT) CAPS capsule, Take 1 capsule (50,000 Units total) by mouth every 7 (seven) days., Disp: 12 capsule, Rfl: 0  Social History   Tobacco Use  Smoking Status Former   Packs/day: 1.00   Years: 30.00   Pack years: 30.00   Types: Cigarettes   Quit date: 02/2017   Years since quitting: 4.3  Smokeless  Tobacco Never  Tobacco Comments   started in her [age] until 2009     Allergies  Allergen Reactions   Other Other (See Comments)    NO Blind Scopes with Naso Gastric tube.  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   Latex     Tape= burns   Nsaids Other (See Comments)    S/P gastric bypass-told not to take. GI bleeds with NSAIDS   Objective:  There were no vitals filed for this visit. There is no height or weight on file to calculate BMI. Constitutional Well developed. Well nourished.  Vascular Dorsalis pedis pulses palpable bilaterally. Posterior tibial pulses palpable bilaterally. Capillary refill normal to all digits.  No cyanosis or clubbing noted. Pedal hair growth normal.  Neurologic Normal speech. Oriented to person, place, and time. Protective sensation absent  Dermatologic Wound Location: Left submetatarsal 1 ulceration limited to the breakdown of the skin Wound Base: Mixed Granular/Fibrotic Peri-wound: Calloused Exudate: Scant/small amount Serosanguinous exudate Wound Measurements: -See below  Orthopedic: No pain to palpation either foot.   Radiographs: None Assessment:   1. Diabetes mellitus with complication ([place])   2. Chronic foot ulcer, limited to breakdown of skin, left ([place])    Plan:  Patient was evaluated and treated and all questions answered.  Ulcer left submetatarsal 1 ulceration limited to the breakdown of the skin -Debridement as below. -Dressed with triple antibiotic and a Band-Aid, DSD. -Continue off-loading with surgical shoe.  Procedure: Excisional Debridement of Wound Tool: Sharp chisel blade/tissue nipper Rationale: Removal of non-viable soft tissue from the wound to promote  healing.  Anesthesia: none Pre-Debridement Wound Measurements: 0.3 cm x 0.3 cm x 0.2 cm  Post-Debridement Wound Measurements: 0.5 cm x 0.6 cm x 0.2 cm  Type of Debridement: Sharp Excisional Tissue Removed: Non-viable soft tissue Blood loss: Minimal (<50cc) Depth of Debridement: subcutaneous tissue. Technique: Sharp excisional debridement to bleeding, viable wound base.  Wound Progress: This is my initial evaluation of continue to monitor the progression of the wound. Site healing conversation 7 Dressing: Dry, sterile, compression dressing. Disposition: Patient tolerated procedure well. Patient to return in 1 week for follow-up.  No follow-ups on file.

## 2021-07-14 ENCOUNTER — Encounter: Payer: Self-pay | Admitting: Internal Medicine

## 2021-07-14 DIAGNOSIS — L089 Local infection of the skin and subcutaneous tissue, unspecified: Secondary | ICD-10-CM

## 2021-07-14 DIAGNOSIS — E11628 Type 2 diabetes mellitus with other skin complications: Secondary | ICD-10-CM

## 2021-07-14 DIAGNOSIS — E118 Type 2 diabetes mellitus with unspecified complications: Secondary | ICD-10-CM

## 2021-07-15 ENCOUNTER — Telehealth: Payer: Self-pay | Admitting: Interventional Cardiology

## 2021-07-15 MED ORDER — METOPROLOL SUCCINATE ER 25 MG PO TB24: ORAL_TABLET | ORAL | 3 refills | Status: DC

## 2021-07-15 NOTE — Telephone Encounter (Signed)
Pt's medication was sent to pt's pharmacy as requested. Confirmation received.  °

## 2021-07-15 NOTE — Telephone Encounter (Signed)
*  STAT* If patient is at the pharmacy, call can be transferred to refill team.   1. Which medications need to be refilled? (please list name of each medication and dose if known) metoprolol succinate (TOPROL-XL) 25 MG 24 hr tablet  2. Which pharmacy/location (including street and city if local pharmacy) is medication to be sent to? [facility] [id number] - [place], [place]  3. Do they need a 30 day or 90 day supply? 30 day

## 2021-07-27 ENCOUNTER — Telehealth: Payer: Self-pay | Admitting: Internal Medicine

## 2021-07-27 NOTE — Telephone Encounter (Signed)
Mary [name] is calling requesting a provider switch from Dr. Rayann Heman to Dr. Lovena Le. She is requesting this due to hearing Dr. Rayann Heman will be leaving, and Dr. Lovena Le being the one who put her pacemaker in as well as who her husband sees.

## 2021-07-30 ENCOUNTER — Telehealth: Payer: Self-pay | Admitting: Internal Medicine

## 2021-07-30 NOTE — Telephone Encounter (Signed)
Does her visit need to be moved up? See below.

## 2021-07-30 NOTE — Telephone Encounter (Signed)
Jaquelyn Bitter from [place] with [facility] called   Reported the patient has moderate PAD in both legs. Also took her A1C levels and it was 11.1. Patient mentioned her A1C has been elevated this month.   Best contact #: [phone]

## 2021-07-31 ENCOUNTER — Ambulatory Visit: Payer: Medicare Other | Admitting: Podiatry

## 2021-07-31 NOTE — Telephone Encounter (Signed)
Spoke with the patient's husband and he stated he would relay the message to his wife and that she has a appt with [facility] wound center for her foot ulcer.

## 2021-07-31 NOTE — Telephone Encounter (Signed)
She needs to see her endocrinologist for the sugars especially in the setting of her foot ulcer this will not heal well without good sugar control.

## 2021-08-01 NOTE — Telephone Encounter (Signed)
Agreed  I think that Dr Lovena Le would be the perfect person to follow her going forward.

## 2021-08-01 NOTE — Telephone Encounter (Signed)
With all of the above, that is ok with me. Perhaps she can see our PA on her next visit if she has recently seen Dr. Greggory Brandy.

## 2021-08-10 ENCOUNTER — Other Ambulatory Visit: Payer: Self-pay | Admitting: Internal Medicine

## 2021-08-10 DIAGNOSIS — R471 Dysarthria and anarthria: Secondary | ICD-10-CM

## 2021-08-11 ENCOUNTER — Encounter: Payer: Self-pay | Admitting: Interventional Cardiology

## 2021-08-11 DIAGNOSIS — I25118 Atherosclerotic heart disease of native coronary artery with other forms of angina pectoris: Secondary | ICD-10-CM

## 2021-08-11 DIAGNOSIS — I5032 Chronic diastolic (congestive) heart failure: Secondary | ICD-10-CM

## 2021-08-11 MED ORDER — DAPAGLIFLOZIN PROPANEDIOL 10 MG PO TABS: 10.0000 mg | ORAL_TABLET | Freq: Every day | ORAL | 11 refills | Status: DC

## 2021-08-11 NOTE — Telephone Encounter (Signed)
Spoke with pt and made her aware of recommendations per Dr. [name].  She will come by and get samples tomorrow or the next day.  She is going to speak to her husband to see when he can bring her to have labs drawn and will send a MyChart message with that date.  Pt appreciative for call.

## 2021-08-13 ENCOUNTER — Other Ambulatory Visit: Payer: Medicare Other

## 2021-08-13 ENCOUNTER — Ambulatory Visit: Payer: Medicare Other

## 2021-08-13 ENCOUNTER — Other Ambulatory Visit: Payer: Self-pay

## 2021-08-13 DIAGNOSIS — R471 Dysarthria and anarthria: Secondary | ICD-10-CM

## 2021-08-17 ENCOUNTER — Ambulatory Visit: Payer: Medicare Other | Admitting: Internal Medicine

## 2021-08-19 ENCOUNTER — Other Ambulatory Visit: Payer: Self-pay | Admitting: Interventional Cardiology

## 2021-08-23 ENCOUNTER — Encounter (INDEPENDENT_AMBULATORY_CARE_PROVIDER_SITE_OTHER): Payer: Self-pay

## 2021-08-25 ENCOUNTER — Other Ambulatory Visit: Payer: Self-pay

## 2021-08-25 ENCOUNTER — Other Ambulatory Visit: Payer: Medicare Other | Admitting: *Deleted

## 2021-08-25 DIAGNOSIS — I25118 Atherosclerotic heart disease of native coronary artery with other forms of angina pectoris: Secondary | ICD-10-CM

## 2021-08-25 DIAGNOSIS — I5032 Chronic diastolic (congestive) heart failure: Secondary | ICD-10-CM

## 2021-08-26 ENCOUNTER — Telehealth: Payer: Self-pay

## 2021-08-26 LAB — BASIC METABOLIC PANEL
BUN/Creatinine Ratio: 14 (ref 12–28)
BUN: 21 mg/dL (ref 8–27)
CO2: 27 mmol/L (ref 20–29)
Calcium: 9.4 mg/dL (ref 8.7–10.3)
Chloride: 97 mmol/L (ref 96–106)
Creatinine, Ser: 1.53 mg/dL — ABNORMAL HIGH (ref 0.57–1.00)
Glucose: 130 mg/dL — ABNORMAL HIGH (ref 70–99)
Potassium: 4.6 mmol/L (ref 3.5–5.2)
Sodium: 140 mmol/L (ref 134–144)
eGFR: 34 mL/min/{1.73_m2} — ABNORMAL LOW (ref >59)

## 2021-08-26 NOTE — Telephone Encounter (Signed)
**Note De-Identified Quran Vasco Obfuscation** Mary Glade PA started through covermymeds. Key: [id number]

## 2021-09-01 NOTE — Telephone Encounter (Signed)
**Note De-Identified Ardell Makarewicz Obfuscation** Your request was denied Key: [id number] We've denied the Part B Drug listed below requested by you or your doctor: Wilder Glade Tab 10mg   I called [facility] and was advised that they have 2 different ins cards that they are running the pts Fargixa RX under and both require a PA.  The pharmacist gave me the information listed on both cards.  ID#: [id number] is a part B plan   The pts Part D ins coverage is:  NT#:[id number] QPY:[id number] PcN: [id number] GRP: COS [employer]  New Farxiga PA started through covermymeds using this info. Key: [id number]

## 2021-09-03 ENCOUNTER — Ambulatory Visit: Payer: Medicare Other | Admitting: Internal Medicine

## 2021-09-08 ENCOUNTER — Encounter: Payer: Self-pay | Admitting: Internal Medicine

## 2021-09-08 ENCOUNTER — Ambulatory Visit: Payer: Medicare Other | Admitting: Internal Medicine

## 2021-09-08 ENCOUNTER — Other Ambulatory Visit: Payer: Self-pay

## 2021-09-08 VITALS — BP 126/82 | HR 84 | Resp 18 | Ht 67.0 in

## 2021-09-08 DIAGNOSIS — E039 Hypothyroidism, unspecified: Secondary | ICD-10-CM

## 2021-09-08 DIAGNOSIS — I5032 Chronic diastolic (congestive) heart failure: Secondary | ICD-10-CM

## 2021-09-08 DIAGNOSIS — E118 Type 2 diabetes mellitus with unspecified complications: Secondary | ICD-10-CM | POA: Diagnosis not present

## 2021-09-08 DIAGNOSIS — M5416 Radiculopathy, lumbar region: Secondary | ICD-10-CM

## 2021-09-08 DIAGNOSIS — I10 Essential (primary) hypertension: Secondary | ICD-10-CM | POA: Diagnosis not present

## 2021-09-08 LAB — COMPREHENSIVE METABOLIC PANEL
ALT: 12 U/L (ref 0–35)
AST: 13 U/L (ref 0–37)
Albumin: 3.6 g/dL (ref 3.5–5.2)
Alkaline Phosphatase: 94 U/L (ref 39–117)
BUN: 17 mg/dL (ref 6–23)
CO2: 30 mEq/L (ref 19–32)
Calcium: 9.1 mg/dL (ref 8.4–10.5)
Chloride: 100 mEq/L (ref 96–112)
Creatinine, Ser: 1.05 mg/dL (ref 0.40–1.20)
GFR: 50.45 mL/min — ABNORMAL LOW (ref >60.00)
Glucose, Bld: 128 mg/dL — ABNORMAL HIGH (ref 70–99)
Potassium: 4.5 mEq/L (ref 3.5–5.1)
Sodium: 138 mEq/L (ref 135–145)
Total Bilirubin: 0.5 mg/dL (ref 0.2–1.2)
Total Protein: 7 g/dL (ref 6.0–8.3)

## 2021-09-08 LAB — CBC
HCT: 29.5 % — ABNORMAL LOW (ref 36.0–46.0)
Hemoglobin: 9.4 g/dL — ABNORMAL LOW (ref 12.0–15.0)
MCHC: 31.7 g/dL (ref 30.0–36.0)
MCV: 80.1 fl (ref 78.0–100.0)
Platelets: 417 10*3/uL — ABNORMAL HIGH (ref 150.0–400.0)
RBC: 3.69 Mil/uL — ABNORMAL LOW (ref 3.87–5.11)
RDW: 15.8 % — ABNORMAL HIGH (ref 11.5–15.5)
WBC: 8.8 10*3/uL (ref 4.0–10.5)

## 2021-09-08 LAB — T4, FREE: Free T4: 2.09 ng/dL — ABNORMAL HIGH (ref 0.60–1.60)

## 2021-09-08 LAB — TSH: TSH: 0.02 u[IU]/mL — ABNORMAL LOW (ref 0.35–5.50)

## 2021-09-08 MED ORDER — HYDROCODONE-ACETAMINOPHEN 5-325 MG PO TABS: 1.0000 | ORAL_TABLET | Freq: Four times a day (QID) | ORAL | 0 refills | Status: DC | PRN

## 2021-09-08 NOTE — Patient Instructions (Addendum)
Get the shingles vaccine at the pharmacy.  We will do the labs today.

## 2021-09-08 NOTE — Progress Notes (Signed)
° °  Subjective:   Patient ID: Mary [name], female    DOB: [birth year], 81 y.o.   MRN: [id number]  HPI The patient is a 81 YO female coming in for follow up.   Review of Systems  Constitutional:  Positive for appetite change and fatigue.  HENT: Negative.    Eyes: Negative.   Respiratory:  Negative for cough, chest tightness and shortness of breath.   Cardiovascular:  Negative for chest pain, palpitations and leg swelling.  Gastrointestinal:  Negative for abdominal distention, abdominal pain, constipation, diarrhea, nausea and vomiting.  Musculoskeletal:  Positive for arthralgias.  Skin: Negative.   Neurological: Negative.   Psychiatric/Behavioral: Negative.     Objective:  Physical Exam Constitutional:      Appearance: She is well-developed. She is obese.  HENT:     Head: Normocephalic and atraumatic.  Cardiovascular:     Rate and Rhythm: Normal rate and regular rhythm.  Pulmonary:     Effort: Pulmonary effort is normal. No respiratory distress.     Breath sounds: Normal breath sounds. No wheezing or rales.  Abdominal:     General: Bowel sounds are normal. There is no distension.     Palpations: Abdomen is soft.     Tenderness: There is no abdominal tenderness. There is no rebound.  Musculoskeletal:        General: Tenderness present.     Cervical back: Normal range of motion.  Skin:    General: Skin is warm and dry.  Neurological:     Mental Status: She is alert and oriented to person, place, and time.     Coordination: Coordination normal.    Vitals:   09/08/21 1339  BP: 126/82  Pulse: 84  Resp: 18  SpO2: 94%  Height: 5\' 7"  (1.702 m)    This visit occurred during the SARS-CoV-2 public health emergency.  Safety protocols were in place, including screening questions prior to the visit, additional usage of staff PPE, and extensive cleaning of exam room while observing appropriate contact time as indicated for disinfecting solutions.   Assessment & Plan:

## 2021-09-11 ENCOUNTER — Other Ambulatory Visit: Payer: Self-pay | Admitting: Internal Medicine

## 2021-09-11 DIAGNOSIS — E039 Hypothyroidism, unspecified: Secondary | ICD-10-CM

## 2021-09-11 MED ORDER — LEVOTHYROXINE SODIUM 125 MCG PO TABS: 125.0000 ug | ORAL_TABLET | Freq: Every day | ORAL | 3 refills | Status: DC

## 2021-09-11 NOTE — Assessment & Plan Note (Signed)
BP at goal checking CMP and adjust metoprolol and spironolactone and torsemide as needed.

## 2021-09-11 NOTE — Assessment & Plan Note (Signed)
Monitoring sugars and doing okay. Taking lantus and SSI.

## 2021-09-11 NOTE — Assessment & Plan Note (Signed)
Did not follow up for labs checking TSH and free T4 and adjust synthroid 150 mcg daily as needed.

## 2021-09-11 NOTE — Assessment & Plan Note (Signed)
Uses hydrocodone 5/325 rarely and we prescribe this for her chronically. Refill for #30 no refills today. Reviewed [place] narcotic database.

## 2021-09-11 NOTE — Assessment & Plan Note (Signed)
No flare today.  

## 2021-09-21 ENCOUNTER — Ambulatory Visit (INDEPENDENT_AMBULATORY_CARE_PROVIDER_SITE_OTHER): Payer: Medicare Other

## 2021-09-21 DIAGNOSIS — I441 Atrioventricular block, second degree: Secondary | ICD-10-CM

## 2021-09-21 LAB — CUP PACEART REMOTE DEVICE CHECK
Battery Remaining Longevity: 10 mo
Battery Remaining Percentage: 11 %
Battery Voltage: 2.84 V
Brady Statistic AP VP Percent: 99 %
Brady Statistic AP VS Percent: 1 %
Brady Statistic AS VP Percent: 1 %
Brady Statistic AS VS Percent: 1 %
Brady Statistic RA Percent Paced: 99 %
Brady Statistic RV Percent Paced: 99 %
Date Time Interrogation Session: 20230123060729
Implantable Lead Implant Date: 20150506
Implantable Lead Implant Date: 20150506
Implantable Lead Location: 753859
Implantable Lead Location: 753860
Implantable Pulse Generator Implant Date: 20150506
Lead Channel Impedance Value: 360 Ohm
Lead Channel Impedance Value: 430 Ohm
Lead Channel Pacing Threshold Amplitude: 0.75 V
Lead Channel Pacing Threshold Amplitude: 0.75 V
Lead Channel Pacing Threshold Pulse Width: 0.4 ms
Lead Channel Pacing Threshold Pulse Width: 0.4 ms
Lead Channel Sensing Intrinsic Amplitude: 0.5 mV
Lead Channel Sensing Intrinsic Amplitude: 5.8 mV
Lead Channel Setting Pacing Amplitude: 2.5 V
Lead Channel Setting Pacing Amplitude: 2.5 V
Lead Channel Setting Pacing Pulse Width: 0.4 ms
Lead Channel Setting Sensing Sensitivity: 4 mV
Pulse Gen Model: 2240
Pulse Gen Serial Number: 3013730

## 2021-10-01 NOTE — Progress Notes (Signed)
Remote pacemaker transmission.   

## 2021-10-11 ENCOUNTER — Other Ambulatory Visit: Payer: Self-pay | Admitting: Internal Medicine

## 2021-10-15 ENCOUNTER — Encounter: Payer: Self-pay | Admitting: Internal Medicine

## 2021-10-16 ENCOUNTER — Other Ambulatory Visit: Payer: Self-pay

## 2021-10-22 ENCOUNTER — Emergency Department (INDEPENDENT_AMBULATORY_CARE_PROVIDER_SITE_OTHER)
Admission: EM | Admit: 2021-10-22 | Discharge: 2021-10-22 | Disposition: A | Discharge status: Home / Self Care | Payer: Medicare Other | Source: Home / Self Care | Attending: Family Medicine | Admitting: Family Medicine

## 2021-10-22 ENCOUNTER — Other Ambulatory Visit: Payer: Self-pay

## 2021-10-22 DIAGNOSIS — R051 Acute cough: Secondary | ICD-10-CM | POA: Diagnosis not present

## 2021-10-22 DIAGNOSIS — J069 Acute upper respiratory infection, unspecified: Secondary | ICD-10-CM

## 2021-10-22 DIAGNOSIS — R0602 Shortness of breath: Secondary | ICD-10-CM

## 2021-10-22 MED ORDER — ALBUTEROL SULFATE HFA 108 (90 BASE) MCG/ACT IN AERS: 1.0000 | INHALATION_SPRAY | Freq: Four times a day (QID) | RESPIRATORY_TRACT | 0 refills | Status: DC | PRN

## 2021-10-22 MED ORDER — HYDROCOD POLI-CHLORPHE POLI ER 10-8 MG/5ML PO SUER
5.0000 mL | Freq: Two times a day (BID) | ORAL | 0 refills | Status: DC | PRN
Start: 2021-10-22 — End: 2022-05-10

## 2021-10-22 MED ORDER — AMOXICILLIN-POT CLAVULANATE 875-125 MG PO TABS: 1.0000 | ORAL_TABLET | Freq: Two times a day (BID) | ORAL | 0 refills | Status: DC

## 2021-10-22 NOTE — Discharge Instructions (Signed)
Make sure you are drinking lots of water Take the antibiotic Augmentin 2 times a day for a week Take a probiotic while you are on the Augmentin to protect your stomach I have refilled your Ventolin inhaler.  I did check in the chart to make sure this is what you have had before.  It was last prescribed in 2019 I have refilled the Tussionex cough medicine.  Take 2 times a day as needed. See your doctor if not improving by next week

## 2021-10-22 NOTE — ED Triage Notes (Signed)
Pt here today c/o cough and shortness of breath. Had a cold a few weeks ago, resolved until this past weekend. Says she has coughing spells that cause the SOB. Cough no longer productive. Has been taking dogs rx for hycodan.

## 2021-10-22 NOTE — ED Provider Notes (Signed)
[facility]    CSN: [id number] Arrival date & time: 10/22/21  1714      History   Chief Complaint Chief Complaint  Patient presents with   Cough   Shortness of Breath    HPI Mary [name] is a 81 y.o. female.   HPI  Patient has multiple medical problems including significant heart disease.  Hyperlipidemia hypertension hypothyroidism and anemia.  She also was a smoker for many years and has underlying COPD.  She is here because she has an upper respiratory infection and cough.  Sputum production.  Fatigue.  Is been going on for 2 weeks.  She has had increased shortness of breath.  She has not been using an inhaler, she states she has not had for over a year.  Thinks it is expired.  Past Medical History:  Diagnosis Date   Arthritis    Atrial tachycardia ([place]) 04/23/2017   CAD (coronary artery disease)    non obstructive   Cardiac pacemaker in situ - St Jude 05/13/2014   Permanent pacemaker for second-degree heart block. Procedure complicated by an atrial lead dislodgment and repeat procedure, 12/2013    Carotid stenosis    Carotid US 5/16:  Bilateral ICA 1-39%; L vertebral retrograde; L BP 126/49, R BP 140/57   Chronic combined systolic and diastolic CHF (congestive heart failure) (HCC)    Chronic lower back pain    Colon cancer (HCC)    a. s/p chemo   Colon polyps    Diabetic peripheral neuropathy (HCC)    DVT (deep venous thrombosis) ([place]) 2013   "twice behind knee on left side" (06/25/2013)   Glaucoma    History of chicken pox    Hyperlipidemia    Hypertension    Hypothyroidism    Iron deficiency anemia    Multinodular goiter    Osteoporosis    PAF (paroxysmal atrial fibrillation) ([place])    Sleep apnea    a. resolved post weight loss    Type II diabetes mellitus ([place])     Patient Active Problem List   Diagnosis Date Noted   B12 deficiency 07/03/2021   Antibiotic-induced yeast infection 02/13/2021   Diabetic infection of left foot ([place]) 02/03/2021    Right wrist pain 10/05/2019   OSA (obstructive sleep apnea) 06/07/2018   COPD (chronic obstructive pulmonary disease) ([place]) 05/24/2018   Acute on chronic combined systolic and diastolic CHF (congestive heart failure) ([place]) 05/24/2018   Osteoarthritis 05/25/2017   Atrial tachycardia ([place]) 04/23/2017   Second degree AV block, Mobitz type II    Depression 08/02/2016   Chronic diastolic heart failure ([place]) 08/02/2016   Tobacco abuse 08/02/2016   Right lumbar radiculitis 06/09/2016   Tear of medial meniscus of right knee, current 04/22/2016   Chronic venous insufficiency 02/21/2015   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 Last one in [place] N/A 02/04/2015   Procedure: Left Heart Cath and  Coronary Angiography;  Surgeon: Belva Crome, MD;  Location: [place] [facility];  Service: Cardiovascular;  Laterality: N/A;   CATARACT EXTRACTION, BILATERAL Bilateral 2013   "and put stent in my left eye for glaucoma" (06/25/2013)   CHOLECYSTECTOMY  1980's   COLECTOMY  10/2010   Tumor removal   EYE MUSCLE SURGERY Bilateral ~ 1963   "muscles too long; eyes would go out and up; tied muscles to hold my eyes straight" (06/25/2013)   LEAD REVISION  01-03-2014   atrial lead revision by Dr Rayann Heman   LEAD REVISION N/A 01/03/2014   Procedure: LEAD REVISION;  Surgeon: Coralyn Mark, MD;  [facility] C[facility] LAB;  Service: Cardiovascular;  Laterality: N/A;   LEFT HEART CATH  AND CORONARY ANGIOGRAPHY N/A 05/29/2018   Procedure: LEFT HEART CATH AND CORONARY ANGIOGRAPHY;  Surgeon: Belva Crome, MD;  Location: [place] [facility];  Service: Cardiovascular;  Laterality: N/A;   PACEMAKER INSERTION  01/02/2014   STJ Assurity dual chamber pacemaker implnated by Dr Lovena Le for SSS   PERMANENT PACEMAKER INSERTION N/A 01/02/2014   Procedure: PERMANENT PACEMAKER INSERTION;  Surgeon: Evans Lance, MD;  Location: [facility] CATH LAB;  Service: Cardiovascular;  Laterality: N/A;   PORTACATH PLACEMENT Left 11/2010   ROUX-EN-Y GASTRIC BYPASS  2009   ULNAR TUNNEL RELEASE Left ~ 7425   UMBILICAL HERNIA REPAIR  1970's?    (06/25/2013)    OB History   No obstetric history on file.      Home Medications    Prior to Admission medications   Medication Sig Start Date End Date Taking? Authorizing Provider  albuterol (VENTOLIN HFA) 108 (90 Base) MCG/ACT inhaler Inhale 1-2 puffs into the lungs every 6 (six) hours as needed for wheezing or shortness of breath. 10/22/21  Yes Raylene Everts, MD  amoxicillin-clavulanate (AUGMENTIN) 875-125 MG tablet Take 1 tablet by mouth every 12 (twelve) hours. 10/22/21  Yes Raylene Everts, MD  chlorpheniramine-HYDROcodone [facility] ER) 10-8 MG/5ML Take 5 mLs by mouth every 12 (twelve) hours as needed for cough. 10/22/21  Yes Raylene Everts, MD  atorvastatin (LIPITOR) 80 MG tablet TAKE 1 TABLET(80 MG) BY MOUTH DAILY 09/17/19   Hoyt Koch, MD  baclofen (LIORESAL) 10 MG tablet Take 1 tablet (10 mg total) by mouth daily as needed for muscle spasms. 06/12/21   Hoyt Koch, MD  bimatoprost (LUMIGAN) 0.01 % SOLN Place 1 drop into the right eye at bedtime.     [provider]  buPROPion (WELLBUTRIN XL) 150 MG 24 hr tablet TAKE 1 TABLET BY MOUTH EVERY DAY 03/23/21   Hoyt Koch, MD  cyanocobalamin (,VITAMIN B-12,) 1000 MCG/ML injection Inject 1 mL (1,000 mcg total) into the muscle every 30 (thirty) days. 07/02/21    Hoyt Koch, MD  dapagliflozin propanediol (FARXIGA) 10 MG TABS tablet Take 1 tablet (10 mg total) by mouth daily before breakfast. 08/11/21   Belva Crome, MD  ELIQUIS 5 MG TABS tablet TAKE 1 TABLET BY MOUTH TWICE A DAY 11/03/20   Belva Crome, MD  HYDROcodone-acetaminophen (NORCO/VICODIN) 5-325 MG tablet Take 1 tablet by mouth every 6 (six) hours as needed. 09/08/21   Hoyt Koch, MD  insulin glargine (LANTUS) 100 UNIT/ML injection Inject into the skin daily. Sliding Scale    [provider]  insulin lispro (HUMALOG) 100 UNIT/ML injection Inject 20 Units into the skin as needed for high blood sugar. If blood sugar over 200.    [provider]  levothyroxine (SYNTHROID) 125 MCG tablet Take 1 tablet (125 mcg total) by mouth daily. 09/11/21   Hoyt Koch, MD  metoprolol succinate (TOPROL-XL) 25 MG 24 hr tablet TAKE 1/2 TABLETS AT BEDTIME 07/15/21   Belva Crome, MD  nitroGLYCERIN (NITROSTAT) 0.4 MG SL tablet Place 1 tablet (0.4 mg total) under the tongue every 5 (five) minutes as needed for chest pain. 04/09/19   Belva Crome, MD  Potassium Chloride ER 20 MEQ TBCR TAKE TWO TABLETS BY MOUTH TWICE A DAY 08/19/21   Belva Crome, MD  spironolactone (ALDACTONE) 25 MG tablet Take 0.5 tablets (12.5 mg total) by mouth daily. 06/25/21   Belva Crome, MD  torsemide (DEMADEX) 20 MG tablet Take 1 tablet (20 mg total) by mouth 2 (two) times daily. 06/25/21   Belva Crome, MD  Vitamin D, Ergocalciferol, (DRISDOL) 1.25 MG (50000 UNIT) CAPS capsule TAKE 1 CAPSULE BY MOUTH EVERY 7 DAYS 10/16/21   Hoyt Koch, MD    Family History Family History  Problem Relation Age of Onset   Breast cancer Mother    Diabetes Mother    Heart disease Father    Heart attack Father    Heart attack Sister    Heart attack Brother    Bladder Cancer Brother    Prostate cancer Brother    Colon polyps Brother    Breast cancer Sister    Breast cancer Maternal Aunt         x 2 aunts   Pancreatic cancer Brother    Colon polyps Brother    Bladder Cancer Cousin    Clotting disorder Sister    Diabetes Sister        x 3   Diabetes Maternal Grandmother     Social History Social History   Tobacco Use   Smoking status: Former    Packs/day: 1.00    Years: 30.00    Pack years: 30.00    Types: Cigarettes    Quit date: 02/2017    Years since quitting: 4.6   Smokeless tobacco: Never   Tobacco comments:    started in her [age] until 2009   Vaping Use   Vaping Use: Never used  Substance Use Topics   Alcohol use: Not Currently    Alcohol/week: 0.0 standard drinks   Drug use: No     Allergies   Other, Adhesive [tape], Aspirin, Latex, and Nsaids   Review of Systems Review of Systems See HPI  Physical Exam Triage Vital Signs ED Triage Vitals  Enc Vitals Group     BP 10/22/21 1736 130/74     Pulse Rate 10/22/21 1736 83     Resp 10/22/21 1736 18     Temp 10/22/21 1736 98.4 F (36.9 C)     Temp Source 10/22/21 1736 Oral     SpO2 10/22/21 1736 93 %     Weight --      Height --      Head Circumference --      Peak Flow --      Pain Score 10/22/21 1737 0     Pain Loc --      Pain Edu? --      Excl. in [place]? --    No data found.  Updated Vital Signs BP 130/74 (BP Location: Right Arm)    Pulse 83    Temp 98.4 F (36.9 C) (Oral)    Resp 18    SpO2 93%  Physical Exam Constitutional:      General: She is not in acute distress.    Appearance: She is well-developed.     Comments: Overweight.  Appears tired.  Slight dyspnea  HENT:     Head: Normocephalic and atraumatic.     Right Ear: Tympanic membrane and ear canal normal.     Left Ear: Tympanic membrane and ear canal normal.     Nose: Nose normal. No congestion.     Mouth/Throat:     Mouth: Mucous membranes are dry.  Eyes:     Conjunctiva/sclera: Conjunctivae normal.     Pupils: Pupils are equal, round, and reactive to light.  Cardiovascular:     Rate and Rhythm: Normal  rate and regular rhythm.     Heart sounds: Normal heart sounds.  Pulmonary:     Effort: Pulmonary effort is normal. No respiratory distress.     Breath sounds: Wheezing and rhonchi present. No rales.  Abdominal:     General: Abdomen is flat. There is no distension.     Palpations: Abdomen is soft.  Musculoskeletal:        General: Normal range of motion.     Cervical back: Normal range of motion.  Lymphadenopathy:     Cervical: No cervical adenopathy.  Skin:    General: Skin is warm and dry.  Neurological:     General: No focal deficit present.     Mental Status: She is alert.  Psychiatric:        Mood and Affect: Mood normal.        Behavior: Behavior normal.     UC Treatments / Results  Labs (all labs ordered are listed, but only abnormal results are displayed) Labs Reviewed - No data to display  EKG   Radiology No results found.  Procedures Procedures (including critical care time)  Medications Ordered in UC Medications - No data to display  Initial Impression / Assessment and Plan / UC Course  I have reviewed the triage vital signs and the nursing notes.  Pertinent labs & imaging results that were available during my care of the patient were reviewed by me and considered in my medical decision making (see chart for details).     I do not feel comfortable giving the patient oral prednisone even though she is wheezing because of her history of congestive heart failure, cardiac arrhythmia, anticoagulation.  Because her infection has been going on for 2 weeks I will offer her an antibiotic and cough medication.  She specifically requests Tussionex.  I have refilled her Ventolin.  She should follow-up with her PCP Final Clinical Impressions(s) / UC Diagnoses   Final diagnoses:  Acute cough  Shortness of breath  Viral URI with cough     Discharge Instructions      Make sure you are drinking lots of water Take the antibiotic Augmentin 2 times a day for a  week Take a probiotic while you are on the Augmentin to protect your stomach I have refilled your Ventolin inhaler.  I did check in the chart to make sure this is what you have had before.  It was last prescribed in 2019 I have refilled the Tussionex cough medicine.  Take 2 times a day as needed. See your doctor if not improving by next week   ED Prescriptions     Medication Sig Dispense Auth. Provider   albuterol (VENTOLIN HFA) 108 (90 Base) MCG/ACT inhaler Inhale 1-2 puffs into the lungs every 6 (six)  hours as needed for wheezing or shortness of breath. 18 g Raylene Everts, MD   chlorpheniramine-HYDROcodone [facility] ER) 10-8 MG/5ML Take 5 mLs by mouth every 12 (twelve) hours as needed for cough. 115 mL Raylene Everts, MD   amoxicillin-clavulanate (AUGMENTIN) 875-125 MG tablet Take 1 tablet by mouth every 12 (twelve) hours. 14 tablet Raylene Everts, MD      PDMP not reviewed this encounter.   Raylene Everts, MD 10/22/21 2053

## 2021-11-17 ENCOUNTER — Other Ambulatory Visit (HOSPITAL_BASED_OUTPATIENT_CLINIC_OR_DEPARTMENT_OTHER): Payer: Self-pay | Admitting: Internal Medicine

## 2021-11-17 DIAGNOSIS — Z1231 Encounter for screening mammogram for malignant neoplasm of breast: Secondary | ICD-10-CM

## 2021-12-09 DIAGNOSIS — D509 Iron deficiency anemia, unspecified: Secondary | ICD-10-CM | POA: Insufficient documentation

## 2021-12-14 ENCOUNTER — Inpatient Hospital Stay (HOSPITAL_BASED_OUTPATIENT_CLINIC_OR_DEPARTMENT_OTHER): Admission: RE | Admit: 2021-12-14 | Payer: Medicare Other | Source: Ambulatory Visit

## 2021-12-17 ENCOUNTER — Other Ambulatory Visit: Payer: Self-pay | Admitting: Interventional Cardiology

## 2021-12-17 DIAGNOSIS — I480 Paroxysmal atrial fibrillation: Secondary | ICD-10-CM

## 2021-12-18 NOTE — Telephone Encounter (Signed)
Prescription refill request for Eliquis received. ?Indication: Afib  ?Last office visit:04/30/21 [name]) ?Scr: 1.05 (09/08/21) ?Age: 81 ?Weight: 95.8kg ? ?Appropriate dose and refill sent to requested pharmacy.  ?

## 2021-12-21 ENCOUNTER — Ambulatory Visit (INDEPENDENT_AMBULATORY_CARE_PROVIDER_SITE_OTHER): Payer: Medicare Other

## 2021-12-21 DIAGNOSIS — I441 Atrioventricular block, second degree: Secondary | ICD-10-CM

## 2021-12-22 ENCOUNTER — Telehealth: Payer: Self-pay

## 2021-12-22 LAB — CUP PACEART REMOTE DEVICE CHECK
Battery Remaining Longevity: 7 mo
Battery Remaining Percentage: 9 %
Battery Voltage: 2.81 V
Brady Statistic AP VP Percent: 99 %
Brady Statistic AP VS Percent: 1 %
Brady Statistic AS VP Percent: 1 %
Brady Statistic AS VS Percent: 1 %
Brady Statistic RA Percent Paced: 99 %
Brady Statistic RV Percent Paced: 99 %
Date Time Interrogation Session: 20230425014732
Implantable Lead Implant Date: 20150506
Implantable Lead Implant Date: 20150506
Implantable Lead Location: 753859
Implantable Lead Location: 753860
Implantable Pulse Generator Implant Date: 20150506
Lead Channel Impedance Value: 340 Ohm
Lead Channel Impedance Value: 380 Ohm
Lead Channel Pacing Threshold Amplitude: 0.75 V
Lead Channel Pacing Threshold Amplitude: 0.75 V
Lead Channel Pacing Threshold Pulse Width: 0.4 ms
Lead Channel Pacing Threshold Pulse Width: 0.4 ms
Lead Channel Sensing Intrinsic Amplitude: 0.5 mV
Lead Channel Sensing Intrinsic Amplitude: 5.8 mV
Lead Channel Setting Pacing Amplitude: 2.5 V
Lead Channel Setting Pacing Amplitude: 2.5 V
Lead Channel Setting Pacing Pulse Width: 0.4 ms
Lead Channel Setting Sensing Sensitivity: 4 mV
Pulse Gen Model: 2240
Pulse Gen Serial Number: 3013730

## 2021-12-22 NOTE — Telephone Encounter (Signed)
Scheduled remote reviewed. Normal device function.   ?Battery estimated 7.37mo?Next remote to be determined ?Route to triage ?LA ? ?Successful telephone encounter to patient and husband to discuss need for monthly remotes to monitor low battery voltage. All questions answered. Monthly remotes scheduled.  ? ? ? ? ? ? ? ?

## 2021-12-28 ENCOUNTER — Ambulatory Visit (HOSPITAL_BASED_OUTPATIENT_CLINIC_OR_DEPARTMENT_OTHER): Payer: Medicare Other

## 2021-12-30 ENCOUNTER — Ambulatory Visit (HOSPITAL_BASED_OUTPATIENT_CLINIC_OR_DEPARTMENT_OTHER): Payer: Medicare Other

## 2022-01-06 NOTE — Progress Notes (Signed)
Remote pacemaker transmission.   

## 2022-01-26 ENCOUNTER — Encounter (HOSPITAL_BASED_OUTPATIENT_CLINIC_OR_DEPARTMENT_OTHER): Payer: Self-pay

## 2022-01-26 ENCOUNTER — Ambulatory Visit (INDEPENDENT_AMBULATORY_CARE_PROVIDER_SITE_OTHER): Payer: Medicare Other

## 2022-01-26 ENCOUNTER — Ambulatory Visit (HOSPITAL_BASED_OUTPATIENT_CLINIC_OR_DEPARTMENT_OTHER)
Admission: RE | Admit: 2022-01-26 | Discharge: 2022-01-26 | Disposition: A | Discharge status: Home / Self Care | Payer: Medicare Other | Source: Ambulatory Visit | Attending: Internal Medicine | Admitting: Internal Medicine

## 2022-01-26 DIAGNOSIS — I441 Atrioventricular block, second degree: Secondary | ICD-10-CM

## 2022-01-26 DIAGNOSIS — Z1231 Encounter for screening mammogram for malignant neoplasm of breast: Secondary | ICD-10-CM | POA: Diagnosis present

## 2022-01-27 LAB — CUP PACEART REMOTE DEVICE CHECK
Battery Remaining Longevity: 8 mo
Battery Remaining Percentage: 9 %
Battery Voltage: 2.81 V
Brady Statistic AP VP Percent: 99 %
Brady Statistic AP VS Percent: 1 %
Brady Statistic AS VP Percent: 1 %
Brady Statistic AS VS Percent: 1 %
Brady Statistic RA Percent Paced: 99 %
Brady Statistic RV Percent Paced: 99 %
Date Time Interrogation Session: 20230530060234
Implantable Lead Implant Date: 20150506
Implantable Lead Implant Date: 20150506
Implantable Lead Location: 753859
Implantable Lead Location: 753860
Implantable Pulse Generator Implant Date: 20150506
Lead Channel Impedance Value: 350 Ohm
Lead Channel Impedance Value: 390 Ohm
Lead Channel Pacing Threshold Amplitude: 0.75 V
Lead Channel Pacing Threshold Amplitude: 0.75 V
Lead Channel Pacing Threshold Pulse Width: 0.4 ms
Lead Channel Pacing Threshold Pulse Width: 0.4 ms
Lead Channel Sensing Intrinsic Amplitude: 0.5 mV
Lead Channel Sensing Intrinsic Amplitude: 9.5 mV
Lead Channel Setting Pacing Amplitude: 2.5 V
Lead Channel Setting Pacing Amplitude: 2.5 V
Lead Channel Setting Pacing Pulse Width: 0.4 ms
Lead Channel Setting Sensing Sensitivity: 4 mV
Pulse Gen Model: 2240
Pulse Gen Serial Number: 3013730

## 2022-02-05 ENCOUNTER — Ambulatory Visit: Payer: Medicare Other | Admitting: Internal Medicine

## 2022-02-10 ENCOUNTER — Telehealth: Payer: Self-pay | Admitting: Interventional Cardiology

## 2022-02-10 NOTE — Telephone Encounter (Signed)
Patient called back and I have let her know to bring her monitor with her is she is going to be gone for a month. Patient verbalized understanding

## 2022-02-10 NOTE — Telephone Encounter (Signed)
LVM for patient to call device clinic back direct number given [phone]

## 2022-02-10 NOTE — Telephone Encounter (Signed)
Pt states that she will be going out of town soon for a month. Pt wants to know what she is to do as far as her device. Please advise

## 2022-02-11 NOTE — Progress Notes (Signed)
Remote pacemaker transmission.   

## 2022-02-15 ENCOUNTER — Ambulatory Visit: Payer: Medicare Other | Admitting: Internal Medicine

## 2022-02-15 ENCOUNTER — Encounter: Payer: Self-pay | Admitting: Internal Medicine

## 2022-02-15 ENCOUNTER — Other Ambulatory Visit: Payer: Self-pay | Admitting: Internal Medicine

## 2022-02-15 VITALS — BP 138/76 | HR 73 | Temp 98.3°F | Ht 67.0 in | Wt 234.1 lb

## 2022-02-15 DIAGNOSIS — M159 Polyosteoarthritis, unspecified: Secondary | ICD-10-CM

## 2022-02-15 DIAGNOSIS — E039 Hypothyroidism, unspecified: Secondary | ICD-10-CM | POA: Diagnosis not present

## 2022-02-15 DIAGNOSIS — M79641 Pain in right hand: Secondary | ICD-10-CM | POA: Diagnosis not present

## 2022-02-15 DIAGNOSIS — E118 Type 2 diabetes mellitus with unspecified complications: Secondary | ICD-10-CM | POA: Diagnosis not present

## 2022-02-15 DIAGNOSIS — Z85038 Personal history of other malignant neoplasm of large intestine: Secondary | ICD-10-CM | POA: Insufficient documentation

## 2022-02-15 DIAGNOSIS — C185 Malignant neoplasm of splenic flexure: Secondary | ICD-10-CM | POA: Insufficient documentation

## 2022-02-15 DIAGNOSIS — D508 Other iron deficiency anemias: Secondary | ICD-10-CM

## 2022-02-15 DIAGNOSIS — M150 Primary generalized (osteo)arthritis: Secondary | ICD-10-CM

## 2022-02-15 LAB — COMPREHENSIVE METABOLIC PANEL
ALT: 10 U/L (ref 0–35)
AST: 16 U/L (ref 0–37)
Albumin: 4 g/dL (ref 3.5–5.2)
Alkaline Phosphatase: 111 U/L (ref 39–117)
BUN: 24 mg/dL — ABNORMAL HIGH (ref 6–23)
CO2: 31 mEq/L (ref 19–32)
Calcium: 9.5 mg/dL (ref 8.4–10.5)
Chloride: 97 mEq/L (ref 96–112)
Creatinine, Ser: 1.36 mg/dL — ABNORMAL HIGH (ref 0.40–1.20)
GFR: 36.87 mL/min — ABNORMAL LOW (ref >60.00)
Glucose, Bld: 141 mg/dL — ABNORMAL HIGH (ref 70–99)
Potassium: 5.2 mEq/L — ABNORMAL HIGH (ref 3.5–5.1)
Sodium: 135 mEq/L (ref 135–145)
Total Bilirubin: 0.5 mg/dL (ref 0.2–1.2)
Total Protein: 7.9 g/dL (ref 6.0–8.3)

## 2022-02-15 LAB — CBC
HCT: 28.6 % — ABNORMAL LOW (ref 36.0–46.0)
Hemoglobin: 9 g/dL — ABNORMAL LOW (ref 12.0–15.0)
MCHC: 31.5 g/dL (ref 30.0–36.0)
MCV: 74.5 fl — ABNORMAL LOW (ref 78.0–100.0)
Platelets: 514 10*3/uL — ABNORMAL HIGH (ref 150.0–400.0)
RBC: 3.83 Mil/uL — ABNORMAL LOW (ref 3.87–5.11)
RDW: 17.4 % — ABNORMAL HIGH (ref 11.5–15.5)
WBC: 7.1 10*3/uL (ref 4.0–10.5)

## 2022-02-15 LAB — HEMOGLOBIN A1C: Hgb A1c MFr Bld: 9.6 % — ABNORMAL HIGH (ref 4.6–6.5)

## 2022-02-15 LAB — FERRITIN: Ferritin: 8.5 ng/mL — ABNORMAL LOW (ref 10.0–291.0)

## 2022-02-15 LAB — T4, FREE: Free T4: 0.74 ng/dL (ref 0.60–1.60)

## 2022-02-15 LAB — TSH: TSH: 20.91 u[IU]/mL — ABNORMAL HIGH (ref 0.35–5.50)

## 2022-02-15 MED ORDER — HYDROCODONE-ACETAMINOPHEN 5-325 MG PO TABS: 1.0000 | ORAL_TABLET | Freq: Four times a day (QID) | ORAL | 0 refills | Status: DC | PRN

## 2022-02-15 NOTE — Assessment & Plan Note (Signed)
Referral to hand surgery for possible injection in the hand. She does not wish to pursue surgery.

## 2022-02-15 NOTE — Progress Notes (Signed)
   Subjective:   Patient ID: Mary [name], female    DOB: 11-20-1940, 81 y.o.   MRN: [id number]  Hand Pain  Associated symptoms include numbness. Pertinent negatives include no chest pain.   The patient is an 81 YO female coming in for concerns.  Review of Systems  Constitutional:  Positive for activity change, appetite change and fatigue.  HENT: Negative.    Eyes: Negative.   Respiratory:  Negative for cough, chest tightness and shortness of breath.   Cardiovascular:  Negative for chest pain, palpitations and leg swelling.  Gastrointestinal:  Positive for constipation. Negative for abdominal distention, abdominal pain, diarrhea, nausea and vomiting.  Musculoskeletal:  Positive for arthralgias, gait problem and myalgias.  Skin: Negative.   Neurological:  Positive for numbness.  Psychiatric/Behavioral: Negative.      Objective:  Physical Exam Constitutional:      Appearance: She is well-developed.  HENT:     Head: Normocephalic and atraumatic.  Cardiovascular:     Rate and Rhythm: Normal rate and regular rhythm.  Pulmonary:     Effort: Pulmonary effort is normal. No respiratory distress.     Breath sounds: Normal breath sounds. No wheezing or rales.  Abdominal:     General: Bowel sounds are normal. There is no distension.     Palpations: Abdomen is soft.     Tenderness: There is no abdominal tenderness. There is no rebound.  Musculoskeletal:     Cervical back: Normal range of motion.  Skin:    General: Skin is warm and dry.     Comments: Foot exam done  Neurological:     Mental Status: She is alert and oriented to person, place, and time.     Coordination: Coordination abnormal.     Vitals:   02/15/22 1101  BP: 138/76  Pulse: 73  Temp: 98.3 F (36.8 C)  TempSrc: Oral  SpO2: 99%  Weight: 234 lb 2 oz (106.2 kg)  Height: '5\' 7"'$  (1.702 m)    Assessment & Plan:

## 2022-02-15 NOTE — Assessment & Plan Note (Signed)
She feels like this is bad due to worsening fatigue. Checking CBC and ferritin and if needed order iron infusion.

## 2022-02-15 NOTE — Patient Instructions (Signed)
We will check the labs and arrange iron infusion if needed.  We will get you in a hand surgeon for the injection if needed.

## 2022-02-15 NOTE — Assessment & Plan Note (Addendum)
Checking Hga1c today. Previously severely out of control 12.5. She is taking lantus on sliding scale and humalog as needed only. Neuropathy stable foot exam done. Checking microalbumin to creatinine ratio. Adjust as needed.

## 2022-02-15 NOTE — Assessment & Plan Note (Signed)
Checking TSH and free T4 due to worsening fatigue. Adjust synthroid 125 mcg daily as needed.

## 2022-02-15 NOTE — Assessment & Plan Note (Signed)
Refill hydrocodone/apap 5/325 mg daily prn which she uses #30 in 3-5 months. Last filled Jan 2023. Reviewed [place] database and no inappropriate fills.

## 2022-02-16 ENCOUNTER — Encounter (HOSPITAL_COMMUNITY): Payer: Self-pay | Admitting: Pharmacy Technician

## 2022-02-16 ENCOUNTER — Telehealth: Payer: Self-pay | Admitting: Internal Medicine

## 2022-02-16 ENCOUNTER — Other Ambulatory Visit: Payer: Self-pay | Admitting: Pharmacy Technician

## 2022-02-16 NOTE — Telephone Encounter (Signed)
Pt called after veiwing results patient is very concerned I advised patient result note is not in Pt requesting call bk [phone]

## 2022-02-16 NOTE — Telephone Encounter (Signed)
Labs have not been reviewed yet , CMA will call once labs have been reviewed by PCP

## 2022-02-17 ENCOUNTER — Encounter: Payer: Self-pay | Admitting: Internal Medicine

## 2022-02-18 ENCOUNTER — Other Ambulatory Visit: Payer: Self-pay | Admitting: Internal Medicine

## 2022-02-18 MED ORDER — LEVOTHYROXINE SODIUM 137 MCG PO TABS: 137.0000 ug | ORAL_TABLET | Freq: Every day | ORAL | 3 refills | Status: AC

## 2022-02-19 ENCOUNTER — Ambulatory Visit (INDEPENDENT_AMBULATORY_CARE_PROVIDER_SITE_OTHER): Payer: Medicare Other

## 2022-02-19 VITALS — BP 110/75 | HR 72 | Temp 98.5°F | Resp 16 | Ht 67.0 in | Wt 231.0 lb

## 2022-02-19 DIAGNOSIS — D509 Iron deficiency anemia, unspecified: Secondary | ICD-10-CM | POA: Diagnosis not present

## 2022-02-19 MED ORDER — SODIUM CHLORIDE 0.9 % IV SOLN
510.0000 mg | Freq: Once | INTRAVENOUS | Status: AC
  Administered 2022-02-19: 510 mg via INTRAVENOUS
  Filled 2022-02-19: qty 17

## 2022-02-20 ENCOUNTER — Encounter: Payer: Self-pay | Admitting: Internal Medicine

## 2022-02-25 ENCOUNTER — Other Ambulatory Visit: Payer: Self-pay | Admitting: Internal Medicine

## 2022-02-25 MED ORDER — BACLOFEN 10 MG PO TABS: 10.0000 mg | ORAL_TABLET | Freq: Every day | ORAL | 0 refills | Status: DC | PRN

## 2022-02-26 ENCOUNTER — Ambulatory Visit: Payer: Medicare Other

## 2022-03-05 ENCOUNTER — Ambulatory Visit (INDEPENDENT_AMBULATORY_CARE_PROVIDER_SITE_OTHER): Payer: Medicare Other

## 2022-03-05 VITALS — BP 117/78 | HR 74 | Temp 98.0°F | Resp 18 | Ht 67.0 in | Wt 228.6 lb

## 2022-03-05 DIAGNOSIS — D509 Iron deficiency anemia, unspecified: Secondary | ICD-10-CM

## 2022-03-05 MED ORDER — SODIUM CHLORIDE 0.9 % IV SOLN
510.0000 mg | Freq: Once | INTRAVENOUS | Status: AC
  Administered 2022-03-05: 510 mg via INTRAVENOUS
  Filled 2022-03-05: qty 17

## 2022-03-05 NOTE — Progress Notes (Signed)
Diagnosis: Iron Deficiency Anemia  Provider:  Marshell Garfinkel, MD  Procedure: Infusion  IV Type: Peripheral, IV Location: R Antecubital  Feraheme (Ferumoxytol), Dose: 510 mg  Infusion Start Time: 0016  Infusion Stop Time: 4290  Post Infusion IV Care: Peripheral IV Discontinued  Discharge: Condition: Good, Destination: Home . AVS provided to patient.   Performed by:  Koren Shiver, RN

## 2022-03-10 ENCOUNTER — Other Ambulatory Visit: Payer: Self-pay | Admitting: Internal Medicine

## 2022-03-16 ENCOUNTER — Telehealth: Payer: Self-pay | Admitting: Interventional Cardiology

## 2022-03-16 NOTE — Telephone Encounter (Signed)
Called pt to inform her that she needed to contact PCP Dr. Sharlet Salina for a refill on her atorvastatin, because that doctor is the one that has been refilling it. I advised pt that if she has any other problems, questions or concerns, to give our office a call back.

## 2022-03-16 NOTE — Telephone Encounter (Signed)
*  STAT* If patient is at the pharmacy, call can be transferred to refill team.   1. Which medications need to be refilled? (please list name of each medication and dose if known)  atorvastatin (LIPITOR) 80 MG tablet  2. Which pharmacy/location (including street and city if local pharmacy) is medication to be sent to?  [facility] [id number] - [place], [place]    3. Do they need a 30 day or 90 day supply?  30 day supply

## 2022-03-17 ENCOUNTER — Encounter: Payer: Self-pay | Admitting: Interventional Cardiology

## 2022-03-17 ENCOUNTER — Telehealth: Payer: Self-pay | Admitting: Internal Medicine

## 2022-03-17 NOTE — Telephone Encounter (Signed)
Caller & Relationship to patient:self   Call back [phone]   Date of last office visit:02/15/22   Date of next office visit:   Medication(s) to be refilled atorvastatin (LIPITOR) 80 MG tablet    Preferred Pharmacy:  [facility] [id number] - [place], [place] Phone:  [phone]

## 2022-03-17 NOTE — Telephone Encounter (Signed)
See my chart message

## 2022-03-17 NOTE — Telephone Encounter (Signed)
Pt spouse called back requesting update on rx refill. Advised him "looking into your chart it looks like your endocrinologist has been refilling this medication for you. Please contact Dr. Cindra Eves office for this refill."   [facility]

## 2022-03-29 ENCOUNTER — Ambulatory Visit (INDEPENDENT_AMBULATORY_CARE_PROVIDER_SITE_OTHER): Payer: Medicare Other

## 2022-03-29 DIAGNOSIS — I441 Atrioventricular block, second degree: Secondary | ICD-10-CM | POA: Diagnosis not present

## 2022-03-30 LAB — CUP PACEART REMOTE DEVICE CHECK
Battery Remaining Longevity: 5 mo
Battery Remaining Percentage: 6 %
Battery Voltage: 2.77 V
Brady Statistic AP VP Percent: 99 %
Brady Statistic AP VS Percent: 1 %
Brady Statistic AS VP Percent: 1 %
Brady Statistic AS VS Percent: 1 %
Brady Statistic RA Percent Paced: 99 %
Brady Statistic RV Percent Paced: 99 %
Date Time Interrogation Session: 20230731183537
Implantable Lead Implant Date: 20150506
Implantable Lead Implant Date: 20150506
Implantable Lead Location: 753859
Implantable Lead Location: 753860
Implantable Pulse Generator Implant Date: 20150506
Lead Channel Impedance Value: 360 Ohm
Lead Channel Impedance Value: 410 Ohm
Lead Channel Pacing Threshold Amplitude: 0.75 V
Lead Channel Pacing Threshold Amplitude: 0.75 V
Lead Channel Pacing Threshold Pulse Width: 0.4 ms
Lead Channel Pacing Threshold Pulse Width: 0.4 ms
Lead Channel Sensing Intrinsic Amplitude: 0.5 mV
Lead Channel Sensing Intrinsic Amplitude: 9.3 mV
Lead Channel Setting Pacing Amplitude: 2.5 V
Lead Channel Setting Pacing Amplitude: 2.5 V
Lead Channel Setting Pacing Pulse Width: 0.4 ms
Lead Channel Setting Sensing Sensitivity: 4 mV
Pulse Gen Model: 2240
Pulse Gen Serial Number: 3013730

## 2022-04-03 ENCOUNTER — Encounter: Payer: Self-pay | Admitting: Interventional Cardiology

## 2022-04-05 MED ORDER — EMPAGLIFLOZIN 10 MG PO TABS: 10.0000 mg | ORAL_TABLET | Freq: Every day | ORAL | Status: DC

## 2022-04-07 ENCOUNTER — Telehealth: Payer: Self-pay | Admitting: Internal Medicine

## 2022-04-07 NOTE — Telephone Encounter (Signed)
Called to schedule AWV with [facility]. Spoke to [place] patients husband. Stated he will give his wife the message to rtn my call.

## 2022-04-12 ENCOUNTER — Other Ambulatory Visit: Payer: Self-pay | Admitting: Internal Medicine

## 2022-04-29 ENCOUNTER — Ambulatory Visit (INDEPENDENT_AMBULATORY_CARE_PROVIDER_SITE_OTHER): Payer: Medicare Other

## 2022-04-29 DIAGNOSIS — I441 Atrioventricular block, second degree: Secondary | ICD-10-CM

## 2022-04-29 LAB — CUP PACEART REMOTE DEVICE CHECK
Battery Remaining Longevity: 4 mo
Battery Remaining Percentage: 4 %
Battery Voltage: 2.74 V
Brady Statistic AP VP Percent: 99 %
Brady Statistic AP VS Percent: 1 %
Brady Statistic AS VP Percent: 1 %
Brady Statistic AS VS Percent: 1 %
Brady Statistic RA Percent Paced: 99 %
Brady Statistic RV Percent Paced: 99 %
Date Time Interrogation Session: 20230831040013
Implantable Lead Implant Date: 20150506
Implantable Lead Implant Date: 20150506
Implantable Lead Location: 753859
Implantable Lead Location: 753860
Implantable Pulse Generator Implant Date: 20150506
Lead Channel Impedance Value: 360 Ohm
Lead Channel Impedance Value: 450 Ohm
Lead Channel Pacing Threshold Amplitude: 0.75 V
Lead Channel Pacing Threshold Amplitude: 0.75 V
Lead Channel Pacing Threshold Pulse Width: 0.4 ms
Lead Channel Pacing Threshold Pulse Width: 0.4 ms
Lead Channel Sensing Intrinsic Amplitude: 0.5 mV
Lead Channel Sensing Intrinsic Amplitude: 9.3 mV
Lead Channel Setting Pacing Amplitude: 2.5 V
Lead Channel Setting Pacing Amplitude: 2.5 V
Lead Channel Setting Pacing Pulse Width: 0.4 ms
Lead Channel Setting Sensing Sensitivity: 4 mV
Pulse Gen Model: 2240
Pulse Gen Serial Number: 3013730

## 2022-05-01 NOTE — Progress Notes (Signed)
Remote pacemaker transmission.   

## 2022-05-09 NOTE — Progress Notes (Unsigned)
Cardiology Office Note:    Date:  05/10/2022   ID:  Mary [name], DOB 04-04-1941, MRN [id number]  PCP:  Hoyt Koch, MD  Cardiologist:  Sinclair Grooms, MD   Referring MD: Hoyt Koch, *   Chief Complaint  Patient presents with   Congestive Heart Failure   Coronary Artery Disease   Advice Only    Obstructive sleep apnea Diabetes mellitus    History of Present Illness:    Mary [name] is a 81 y.o. 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   He did quit smoking.  Today she is concerned about her battery life in the pacemaker.  She is adhering to reduce salt and limited fluid intake.  She has not had angina or needed to take sublingual nitroglycerin.  Past Medical History:  Diagnosis Date   Arthritis    Atrial tachycardia ([place]) 04/23/2017   CAD (coronary artery disease)    non obstructive   Cardiac pacemaker in situ - St Jude 05/13/2014   Permanent pacemaker for second-degree heart block. Procedure complicated by an atrial lead dislodgment and repeat procedure, 12/2013    Carotid stenosis    Carotid US 5/16:  Bilateral ICA 1-39%; L vertebral retrograde; L BP 126/49, R BP 140/57   Chronic combined systolic and diastolic CHF (congestive heart failure) (HCC)    Chronic lower back pain    Colon cancer (HCC)    a. s/p chemo   Colon polyps    Diabetic peripheral neuropathy (HCC)    DVT (deep venous thrombosis) ([place]) 2013   "twice behind knee on left side" (06/25/2013)   Glaucoma    History of chicken pox    Hyperlipidemia    Hypertension    Hypothyroidism    Iron deficiency anemia    Multinodular goiter    Osteoporosis    PAF (paroxysmal atrial fibrillation) (HCC)    Sleep apnea    a. resolved post weight loss    Type II diabetes mellitus ([place])     Past Surgical  History:  Procedure Laterality Date   CARDIAC CATHETERIZATION  2006   Never had PCI, 3 caths total. Last one in [place] N/A 02/04/2015   Procedure: Left Heart Cath and Coronary Angiography;  Surgeon: Belva Crome, MD;  Location: [facility];  Service: Cardiovascular;  Laterality: N/A;   CATARACT EXTRACTION, BILATERAL Bilateral 2013   "and put stent in my left eye for glaucoma" (06/25/2013)   CHOLECYSTECTOMY  1980's   COLECTOMY  10/2010   Tumor removal   EYE MUSCLE SURGERY Bilateral ~ 1963   "muscles too long; eyes would go out and up; tied muscles to hold my eyes straight" (06/25/2013)   LEAD REVISION  01-03-2014   atrial lead revision by Dr Rayann Heman   LEAD REVISION N/A 01/03/2014   Procedure: LEAD REVISION;  Surgeon: Coralyn Mark, MD;  Location: [facility] CATH LAB;  Service: Cardiovascular;  Laterality: N/A;   LEFT HEART CATH AND CORONARY ANGIOGRAPHY N/A 05/29/2018   Procedure: LEFT HEART CATH AND CORONARY ANGIOGRAPHY;  Surgeon: Belva Crome, MD;  Location: [facility];  Service: Cardiovascular;  Laterality: N/A;   PACEMAKER INSERTION  01/02/2014   STJ Assurity dual chamber pacemaker implnated by Dr Lovena Le for SSS   PERMANENT  PACEMAKER INSERTION N/A 01/02/2014   Procedure: PERMANENT PACEMAKER INSERTION;  Surgeon: Evans Lance, MD;  Location: [facility] C[facility];  Service: Cardiovascular;  Laterality: N/A;   PORTACATH PLACEMENT Left 11/2010   ROUX-EN-Y GASTRIC BYPASS  2009   ULNAR TUNNEL RELEASE Left ~ 7341   UMBILICAL HERNIA REPAIR  1970's?    (06/25/2013)    Current Medications: Current Meds  Medication Sig   albuterol (VENTOLIN HFA) 108 (90 Base) MCG/ACT inhaler Inhale 1-2 puffs into the lungs every 6 (six) hours as needed for wheezing or shortness of breath.   atorvastatin (LIPITOR) 80 MG tablet TAKE 1 TABLET(80 MG) BY MOUTH DAILY   baclofen (LIORESAL) 10 MG tablet Take 1 tablet (10 mg total) by mouth daily as needed for muscle spasms.   buPROPion  (WELLBUTRIN XL) 150 MG 24 hr tablet TAKE 1 TABLET BY MOUTH EVERY DAY   cyanocobalamin (,VITAMIN B-12,) 1000 MCG/ML injection Inject 1 mL (1,000 mcg total) into the muscle every 30 (thirty) days.   ELIQUIS 5 MG TABS tablet TAKE ONE TABLET BY MOUTH TWICE A DAY   empagliflozin (JARDIANCE) 10 MG TABS tablet Take 1 tablet (10 mg total) by mouth daily before breakfast.   HYDROcodone-acetaminophen (NORCO/VICODIN) 5-325 MG tablet Take 1 tablet by mouth every 6 (six) hours as needed.   insulin glargine (LANTUS) 100 UNIT/ML injection Inject into the skin daily. Sliding Scale   insulin lispro (HUMALOG) 100 UNIT/ML injection Inject 20 Units into the skin as needed for high blood sugar. If blood sugar over 200.   levothyroxine (SYNTHROID) 137 MCG tablet Take 1 tablet (137 mcg total) by mouth daily.   metoprolol succinate (TOPROL-XL) 25 MG 24 hr tablet TAKE 1/2 TABLETS AT BEDTIME   nitroGLYCERIN (NITROSTAT) 0.4 MG SL tablet Place 1 tablet (0.4 mg total) under the tongue every 5 (five) minutes as needed for chest pain.   Potassium Chloride ER 20 MEQ TBCR TAKE TWO TABLETS BY MOUTH TWICE A DAY   spironolactone (ALDACTONE) 25 MG tablet Take 0.5 tablets (12.5 mg total) by mouth daily.   torsemide (DEMADEX) 20 MG tablet Take 1 tablet (20 mg total) by mouth 2 (two) times daily.   Vitamin D, Ergocalciferol, (DRISDOL) 1.25 MG (50000 UNIT) CAPS capsule TAKE 1 CAPSULE BY MOUTH EVERY 7 DAYS     Allergies:   Other, Adhesive [tape], Aspirin, Latex, and Nsaids   Social History   Socioeconomic History   Marital status: Married    Spouse name: [name]   Number of children: 0   Years of education: Not on file   Highest education level: Not on file  Occupational History   Occupation: Retired    Comment: office work  Tobacco Use   Smoking status: Former    Packs/day: 1.00    Years: 30.00    Total pack years: 30.00    Types: Cigarettes    Quit date: 02/2017    Years since quitting: 5.2   Smokeless tobacco: Never    Tobacco comments:    started in her [age] until 2009   Vaping Use   Vaping Use: Never used  Substance and Sexual Activity   Alcohol use: Not Currently    Alcohol/week: 0.0 standard drinks of alcohol   Drug use: No   Sexual activity: Not on file  Other Topics Concern   Not on file  Social History Narrative   Married to husband, [name]   No children   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  Recently moved back here from [place] Strain: Low Risk  (12/01/2017)   Overall Financial Resource Strain (CARDIA)    Difficulty of Paying Living Expenses: Not very hard  Food Insecurity: No Food Insecurity (12/01/2017)   Hunger Vital Sign    Worried About Running Out of Food in the Last Year: Never true    Ran Out of Food in the Last Year: Never true  Transportation Needs: No Transportation Needs (12/01/2017)   PRAPARE - Hydrologist (Medical): No    Lack of Transportation (Non-Medical): No  Physical Activity: Inactive (12/01/2017)   Exercise Vital Sign    Days of Exercise per Week: 0 days    Minutes of Exercise per Session: 0 min  Stress: No Stress Concern Present (05/23/2019)   [place]    Feeling of Stress : Not at all  Social Connections: [place] (12/01/2017)   Social Connection and Isolation Panel [NHANES]    Frequency of Communication with Friends and Family: More than three times a week    Frequency of Social Gatherings with Friends and Family: More than three times a week    Attends Religious Services: More than 4 times per year    Active Member of [employer] or Organizations: Yes    Attends Music therapist: More than 4 times per year    Marital Status: Married     Family History: The patient's family history includes Bladder Cancer in her brother and cousin; Breast cancer in her maternal  aunt, mother, and sister; Clotting disorder in her sister; Colon polyps in her brother and brother; Diabetes in her maternal grandmother, mother, and sister; Heart attack in her brother, father, and sister; Heart disease in her father; Pancreatic cancer in her brother; Prostate cancer in her brother.  ROS:   Please see the history of present illness.    She is somewhat frail.  She has had falls.  She is in a wheelchair today not because of any recent injury but to avoid having a problem with trying to ambulate into the office.  All other systems reviewed and are negative.  EKGs/Labs/Other Studies Reviewed:    The following studies were reviewed today: No new data other than Paceart to identify her elective replacement indicator date.  EKG:  EKG AV sequential pacing.  Unchanged from prior.  Recent Labs: 02/15/2022: ALT 10; BUN 24; Creatinine, Ser 1.36; Hemoglobin 9.0; Platelets 514.0; Potassium 5.2 No hemolysis seen; Sodium 135; TSH 20.91  Recent Lipid Panel    Component Value Date/Time   CHOL 208 (H) 07/31/2020 1625   TRIG 305.0 (H) 07/31/2020 1625   HDL 35.10 (L) 07/31/2020 1625   CHOLHDL 6 07/31/2020 1625   VLDL 61.0 (H) 07/31/2020 1625   LDLCALC 73 05/25/2018 0531   LDLDIRECT 116.0 07/31/2020 1625    Physical Exam:    VS:  BP 132/64   Pulse 73   Ht '5\' 7"'$  (1.702 m)   Wt 210 lb (95.3 kg)   SpO2 92%   BMI 32.89 kg/m     Wt Readings from Last 3 Encounters:  05/10/22 210 lb (95.3 kg)  03/05/22 228 lb 9.6 oz (103.7 kg)  02/19/22 231 lb (104.8 kg)     GEN: Obese but has lost nearly 21 pounds since June.  This is somewhat alarming.  She will speak to her primary care about this.. No acute  distress HEENT: Normal NECK: No JVD. LYMPHATICS: No lymphadenopathy CARDIAC: No murmur. RRR no gallop, or edema. VASCULAR:  Normal Pulses. No bruits. RESPIRATORY:  Clear to auscultation without rales, wheezing or rhonchi  ABDOMEN: Soft, non-tender, non-distended, No pulsatile  mass, MUSCULOSKELETAL: No deformity  SKIN: Warm and dry NEUROLOGIC:  Alert and oriented x 3 PSYCHIATRIC:  Normal affect   ASSESSMENT:    1. Coronary artery disease of native artery of native heart with stable angina pectoris ([place])   2. Chronic diastolic heart failure ([place])   3. Second degree AV block, Mobitz type II   4. Essential hypertension   5. PAF (paroxysmal atrial fibrillation) ([place])   6. Chronic obstructive pulmonary disease, unspecified COPD type ([place])   7. Chronic anticoagulation   8. Cardiac pacemaker in situ - St Jude    PLAN:    In order of problems listed above:  Secondary prevention reviewed No evidence of volume overload on exam Pacemaker function was normal when last checked in late August Blood pressure is well controlled possibly related to weight loss in addition to her medications which include Jardiance, Aldactone, Toprol.  She has an upcoming primary care examination and will have laboratory at that time.  She could have some degree of dehydration.  I encouraged her to liberalize fluid intake. Currently in AV sequential pacing Stopped smoking Continue Eliquis.  No current bleeding concerns.  Do have concern about weight loss and physical mobility issues and the possibility of falls. Elective replacement indicator will not occur until approximately 4 months from now.   Medication Adjustments/Labs and Tests Ordered: Current medicines are reviewed at length with the patient today.  Concerns regarding medicines are outlined above.  Orders Placed This Encounter  Procedures   EKG 12-Lead   No orders of the defined types were placed in this encounter.   Patient Instructions  Medication Instructions:  Your physician recommends that you continue on your current medications as directed. Please refer to the Current Medication list given to you today.  *If you need a refill on your cardiac medications before your next appointment, please call your  pharmacy*  Lab Work: NONE  Testing/Procedures: NONE  Follow-Up: At [facility], you and your health needs are our priority.  As part of our continuing mission to provide you with exceptional heart care, we have created designated Provider Care Teams.  These Care Teams include your primary Cardiologist (physician) and Advanced Practice Providers (APPs -  Physician Assistants and Nurse Practitioners) who all work together to provide you with the care you need, when you need it.  Your next appointment:   1 year(s)  The format for your next appointment:   In Person  Provider:   Sinclair Grooms, MD   Important Information About Sugar         Signed, Sinclair Grooms, MD  05/10/2022 5:57 PM    [place]

## 2022-05-10 ENCOUNTER — Encounter: Payer: Self-pay | Admitting: Interventional Cardiology

## 2022-05-10 ENCOUNTER — Ambulatory Visit: Payer: Medicare Other | Attending: Interventional Cardiology | Admitting: Interventional Cardiology

## 2022-05-10 VITALS — BP 132/64 | HR 73 | Ht 67.0 in | Wt 210.0 lb

## 2022-05-10 DIAGNOSIS — Z950 Presence of cardiac pacemaker: Secondary | ICD-10-CM

## 2022-05-10 DIAGNOSIS — I10 Essential (primary) hypertension: Secondary | ICD-10-CM | POA: Diagnosis not present

## 2022-05-10 DIAGNOSIS — Z7901 Long term (current) use of anticoagulants: Secondary | ICD-10-CM

## 2022-05-10 DIAGNOSIS — I441 Atrioventricular block, second degree: Secondary | ICD-10-CM

## 2022-05-10 DIAGNOSIS — I5032 Chronic diastolic (congestive) heart failure: Secondary | ICD-10-CM | POA: Diagnosis not present

## 2022-05-10 DIAGNOSIS — I480 Paroxysmal atrial fibrillation: Secondary | ICD-10-CM

## 2022-05-10 DIAGNOSIS — I25118 Atherosclerotic heart disease of native coronary artery with other forms of angina pectoris: Secondary | ICD-10-CM

## 2022-05-10 DIAGNOSIS — J449 Chronic obstructive pulmonary disease, unspecified: Secondary | ICD-10-CM

## 2022-05-10 NOTE — Patient Instructions (Signed)
Medication Instructions:  Your physician recommends that you continue on your current medications as directed. Please refer to the Current Medication list given to you today.  *If you need a refill on your cardiac medications before your next appointment, please call your pharmacy*  Lab Work: NONE  Testing/Procedures: NONE  Follow-Up: At [facility], you and your health needs are our priority.  As part of our continuing mission to provide you with exceptional heart care, we have created designated Provider Care Teams.  These Care Teams include your primary Cardiologist (physician) and Advanced Practice Providers (APPs -  Physician Assistants and Nurse Practitioners) who all work together to provide you with the care you need, when you need it.  Your next appointment:   1 year(s)  The format for your next appointment:   In Person  Provider:   Henry W Smith III, MD    Important Information About Sugar       

## 2022-05-17 ENCOUNTER — Telehealth: Payer: Self-pay | Admitting: Interventional Cardiology

## 2022-05-17 NOTE — Telephone Encounter (Signed)
No objections

## 2022-05-17 NOTE — Telephone Encounter (Signed)
New Message:      Patient would like to switch from Dr [name] to Dr Sallyanne Kuster, because Dr [name] is retiring.

## 2022-05-20 NOTE — Progress Notes (Signed)
Remote pacemaker transmission.   

## 2022-05-31 ENCOUNTER — Ambulatory Visit (INDEPENDENT_AMBULATORY_CARE_PROVIDER_SITE_OTHER): Payer: Medicare Other

## 2022-05-31 DIAGNOSIS — I441 Atrioventricular block, second degree: Secondary | ICD-10-CM

## 2022-05-31 LAB — CUP PACEART REMOTE DEVICE CHECK
Battery Remaining Longevity: 3 mo
Battery Remaining Percentage: 3 %
Battery Voltage: 2.69 V
Brady Statistic AP VP Percent: 99 %
Brady Statistic AP VS Percent: 1 %
Brady Statistic AS VP Percent: 1 %
Brady Statistic AS VS Percent: 1 %
Brady Statistic RA Percent Paced: 99 %
Brady Statistic RV Percent Paced: 99 %
Date Time Interrogation Session: 20231001020019
Implantable Lead Implant Date: 20150506
Implantable Lead Implant Date: 20150506
Implantable Lead Location: 753859
Implantable Lead Location: 753860
Implantable Pulse Generator Implant Date: 20150506
Lead Channel Impedance Value: 380 Ohm
Lead Channel Impedance Value: 440 Ohm
Lead Channel Pacing Threshold Amplitude: 0.75 V
Lead Channel Pacing Threshold Amplitude: 0.75 V
Lead Channel Pacing Threshold Pulse Width: 0.4 ms
Lead Channel Pacing Threshold Pulse Width: 0.4 ms
Lead Channel Sensing Intrinsic Amplitude: 0.5 mV
Lead Channel Sensing Intrinsic Amplitude: 9.3 mV
Lead Channel Setting Pacing Amplitude: 2.5 V
Lead Channel Setting Pacing Amplitude: 2.5 V
Lead Channel Setting Pacing Pulse Width: 0.4 ms
Lead Channel Setting Sensing Sensitivity: 4 mV
Pulse Gen Model: 2240
Pulse Gen Serial Number: 3013730

## 2022-06-10 ENCOUNTER — Encounter: Payer: Self-pay | Admitting: Internal Medicine

## 2022-06-15 NOTE — Progress Notes (Signed)
Remote pacemaker transmission.   

## 2022-06-30 LAB — CUP PACEART REMOTE DEVICE CHECK
Battery Remaining Longevity: 1 mo
Battery Remaining Percentage: 2 %
Battery Voltage: 2.66 V
Brady Statistic AP VP Percent: 99 %
Brady Statistic AP VS Percent: 1 %
Brady Statistic AS VP Percent: 1 %
Brady Statistic AS VS Percent: 1 %
Brady Statistic RA Percent Paced: 99 %
Brady Statistic RV Percent Paced: 99 %
Date Time Interrogation Session: 20231101020014
Implantable Lead Connection Status: 753985
Implantable Lead Connection Status: 753985
Implantable Lead Implant Date: 20150506
Implantable Lead Implant Date: 20150506
Implantable Lead Location: 753859
Implantable Lead Location: 753860
Implantable Pulse Generator Implant Date: 20150506
Lead Channel Impedance Value: 360 Ohm
Lead Channel Impedance Value: 490 Ohm
Lead Channel Pacing Threshold Amplitude: 0.75 V
Lead Channel Pacing Threshold Amplitude: 0.75 V
Lead Channel Pacing Threshold Pulse Width: 0.4 ms
Lead Channel Pacing Threshold Pulse Width: 0.4 ms
Lead Channel Sensing Intrinsic Amplitude: 0.6 mV
Lead Channel Sensing Intrinsic Amplitude: 9.3 mV
Lead Channel Setting Pacing Amplitude: 2.5 V
Lead Channel Setting Pacing Amplitude: 2.5 V
Lead Channel Setting Pacing Pulse Width: 0.4 ms
Lead Channel Setting Sensing Sensitivity: 4 mV
Pulse Gen Model: 2240
Pulse Gen Serial Number: 3013730

## 2022-07-01 ENCOUNTER — Ambulatory Visit (INDEPENDENT_AMBULATORY_CARE_PROVIDER_SITE_OTHER): Payer: Medicare Other

## 2022-07-01 DIAGNOSIS — I441 Atrioventricular block, second degree: Secondary | ICD-10-CM

## 2022-07-12 NOTE — Progress Notes (Signed)
Remote pacemaker transmission.   

## 2022-07-28 ENCOUNTER — Ambulatory Visit: Payer: Medicare Other | Admitting: Internal Medicine

## 2022-08-02 ENCOUNTER — Ambulatory Visit (INDEPENDENT_AMBULATORY_CARE_PROVIDER_SITE_OTHER): Payer: Medicare Other

## 2022-08-02 ENCOUNTER — Telehealth: Payer: Self-pay | Admitting: Internal Medicine

## 2022-08-02 DIAGNOSIS — I441 Atrioventricular block, second degree: Secondary | ICD-10-CM | POA: Diagnosis not present

## 2022-08-02 NOTE — Telephone Encounter (Signed)
Patient is calling states she was not a no show to last appointment, she had sent 2 MyChart messages. Please advise

## 2022-08-02 NOTE — Telephone Encounter (Signed)
Noted  

## 2022-08-03 LAB — CUP PACEART REMOTE DEVICE CHECK
Battery Remaining Longevity: 1 mo
Battery Remaining Percentage: 0.5 %
Battery Voltage: 2.62 V
Brady Statistic AP VP Percent: 99 %
Brady Statistic AP VS Percent: 1 %
Brady Statistic AS VP Percent: 1 %
Brady Statistic AS VS Percent: 1 %
Brady Statistic RA Percent Paced: 99 %
Brady Statistic RV Percent Paced: 99 %
Date Time Interrogation Session: 20231204083257
Implantable Lead Connection Status: 753985
Implantable Lead Connection Status: 753985
Implantable Lead Implant Date: 20150506
Implantable Lead Implant Date: 20150506
Implantable Lead Location: 753859
Implantable Lead Location: 753860
Implantable Pulse Generator Implant Date: 20150506
Lead Channel Impedance Value: 360 Ohm
Lead Channel Impedance Value: 410 Ohm
Lead Channel Pacing Threshold Amplitude: 0.75 V
Lead Channel Pacing Threshold Amplitude: 0.75 V
Lead Channel Pacing Threshold Pulse Width: 0.4 ms
Lead Channel Pacing Threshold Pulse Width: 0.4 ms
Lead Channel Sensing Intrinsic Amplitude: 0.6 mV
Lead Channel Sensing Intrinsic Amplitude: 6.8 mV
Lead Channel Setting Pacing Amplitude: 2.5 V
Lead Channel Setting Pacing Amplitude: 2.5 V
Lead Channel Setting Pacing Pulse Width: 0.4 ms
Lead Channel Setting Sensing Sensitivity: 4 mV
Pulse Gen Model: 2240
Pulse Gen Serial Number: 3013730

## 2022-08-04 ENCOUNTER — Encounter: Payer: Self-pay | Admitting: Internal Medicine

## 2022-08-09 ENCOUNTER — Telehealth: Payer: Self-pay | Admitting: Internal Medicine

## 2022-08-09 NOTE — Telephone Encounter (Signed)
LVM for pt to rtn my call to schedule AWV with [facility] call back # [phone] 

## 2022-08-13 ENCOUNTER — Encounter: Payer: Self-pay | Admitting: Internal Medicine

## 2022-08-13 ENCOUNTER — Ambulatory Visit: Payer: Medicare Other | Attending: Internal Medicine | Admitting: Internal Medicine

## 2022-08-13 VITALS — BP 142/88 | HR 67 | Ht 67.0 in | Wt 204.0 lb

## 2022-08-13 DIAGNOSIS — Z950 Presence of cardiac pacemaker: Secondary | ICD-10-CM

## 2022-08-13 DIAGNOSIS — E039 Hypothyroidism, unspecified: Secondary | ICD-10-CM

## 2022-08-13 DIAGNOSIS — I442 Atrioventricular block, complete: Secondary | ICD-10-CM | POA: Diagnosis not present

## 2022-08-13 NOTE — Patient Instructions (Signed)
Medication Instructions:  Your physician recommends that you continue on your current medications as directed. Please refer to the Current Medication list given to you today.  Labwork: You will get lab work today:  CBC and BMP  Testing/Procedures: None ordered.  Follow-Up:  See instruction letter  Pacemaker Battery Change  A pacemaker battery usually lasts 5-15 years (6-7 years on average). A few times a year, you may be asked to visit your health care provider to have a full evaluation of your pacemaker. When the battery is low, your pacemaker will be completely replaced. Most often, this procedure is simpler than the first surgery because the wires (leads) that connect the pacemaker to the heart are already in place. There are many things that affect how long a pacemaker battery will last, including: The age of the pacemaker. The number of leads you have(1, 2, or 3). The use or workload of the pacemaker. If the pacemaker is helping the heart more often, the battery will not last as long. Power (voltage) settings. Tell a health care provider about: Any allergies you have. All medicines you are taking, including vitamins, herbs, eye drops, creams, and over-the-counter medicines. Any problems you or family members have had with anesthetic medicines. Any blood disorders you have. Any surgeries you have had, especially any surgeries you have had since your last pacemaker was placed. Any medical conditions you have. Whether you are pregnant or may be pregnant. What are the risks? Generally, this is a safe procedure. However, problems may occur, including: Bleeding. Infection. Nerve damage. Allergic reaction to medicines. Damage to the leads that go to the heart. What happens before the procedure? Staying hydrated Follow instructions from your health care provider about hydration, which may include: Up to 2 hours before the procedure - you may continue to drink clear liquids, such  as water, clear fruit juice, black coffee, and plain tea. Eating and drinking restrictions Follow instructions from your health care provider about eating and drinking restrictions, which may include: 8 hours before the procedure - stop eating heavy meals or foods, such as meat, fried foods, or fatty foods. 6 hours before the procedure - stop eating light meals or foods, such as toast or cereal. 6 hours before the procedure - stop drinking milk or drinks that contain milk. 2 hours before the procedure - stop drinking clear liquids. Medicines Ask your health care provider about: Changing or stopping your regular medicines. This is especially important if you are taking diabetes medicines or blood thinners. Taking medicines such as aspirin and ibuprofen. These medicines can thin your blood. Do not take these medicines unless your health care provider tells you to take them. Taking over-the-counter medicines, vitamins, herbs, and supplements. General instructions Ask your health care provider what steps will be taken to help prevent infection. These may include: Removing hair at the surgery site. Washing skin with a germ-killing soap. Receiving antibiotic medicine. Plan to have someone take you home from the hospital or clinic. If you will be going home right after the procedure, plan to have someone with you for 24 hours. What happens during the procedure? An IV will be inserted into one of your veins. You will be given one or more of the following: A medicine to help you relax (sedative). A medicine to numb the area where the pacemaker is located (local anesthetic). Your health care provider will make an incision to reopen the pocket holding the pacemaker. The old pacemaker will be disconnected from the leads.  The leads will be tested. If needed, the leads will be replaced. If the leads are functioning properly, the new pacemaker will be connected to the existing leads. A heart monitor and  a pacemaker programmer will be used to make sure that the newly implanted pacemaker is working properly. The incision site will be closed with stitches (sutures), adhesive strips, or skin glue. A bandage (dressing) will be placed over the pacemaker site. The procedure may vary among health care providers and hospitals. What happens after the procedure? Your blood pressure, heart rate, breathing rate, and blood oxygen level will be monitored until you leave the hospital or clinic. You may be given antibiotics. Your health care provider will tell you when your pacemaker will need to be tested again, or when to return to the office for removal of the dressing and sutures. If you were given a sedative during the procedure, it can affect you for several hours. Do not drive or operate machinery until your health care provider says that it is safe. You will be given a pacemaker identification card. This card lists the implant date, device model, and manufacturer of your pacemaker. Summary A pacemaker battery usually lasts 5-15 years (6-7 years on average). When the battery is low, your pacemaker will need to be replaced. Most often, this procedure is simpler than the first surgery because the wires (leads) that connect the pacemaker to the heart are already in place. Risks of this procedure include bleeding, infection, and allergic reactions to medicines. This information is not intended to replace advice given to you by your health care provider. Make sure you discuss any questions you have with your health care provider. Document Revised: 07/19/2019 Document Reviewed: 07/19/2019 Elsevier Patient Education  [place].

## 2022-08-13 NOTE — Progress Notes (Signed)
HPI Mary [name] returns today after a long absence from our EP clinic. She is a pleasant 81 yo woman with permanent atrial fib and heart block s/p PPM insertion. Her device is past ERI. She has felt more sob. She has fatigue. She has been at [facility] for months. No chest pain or sob.  Allergies  Allergen Reactions   Other Other (See Comments)    NO Blind Scopes with Naso Gastric tube.  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   Latex     Tape= burns   Nsaids Other (See Comments)    S/P gastric bypass-told not to take. GI bleeds with NSAIDS     Current Outpatient Medications  Medication Sig Dispense Refill   albuterol (VENTOLIN HFA) 108 (90 Base) MCG/ACT inhaler Inhale 1-2 puffs into the lungs every 6 (six) hours as needed for wheezing or shortness of breath. 18 g 0   atorvastatin (LIPITOR) 80 MG tablet TAKE 1 TABLET(80 MG) BY MOUTH DAILY 90 tablet 0   baclofen (LIORESAL) 10 MG tablet Take 1 tablet (10 mg total) by mouth daily as needed for muscle spasms. 30 each 0   buPROPion (WELLBUTRIN XL) 150 MG 24 hr tablet TAKE 1 TABLET BY MOUTH EVERY DAY 90 tablet 1   cyanocobalamin (,VITAMIN B-12,) 1000 MCG/ML injection Inject 1 mL (1,000 mcg total) into the muscle every 30 (thirty) days. 10 mL 0   ELIQUIS 5 MG TABS tablet TAKE ONE TABLET BY MOUTH TWICE A DAY 180 tablet 1   empagliflozin (JARDIANCE) 10 MG TABS tablet Take 1 tablet (10 mg total) by mouth daily before breakfast.     HYDROcodone-acetaminophen (NORCO/VICODIN) 5-325 MG tablet Take 1 tablet by mouth every 6 (six) hours as needed. 30 tablet 0   insulin glargine (LANTUS) 100 UNIT/ML injection Inject into the skin daily. Sliding Scale     insulin lispro (HUMALOG) 100 UNIT/ML  injection Inject 20 Units into the skin as needed for high blood sugar. If blood sugar over 200.     levothyroxine (SYNTHROID) 137 MCG tablet Take 1 tablet (137 mcg total) by mouth daily. 90 tablet 3   metoprolol succinate (TOPROL-XL) 25 MG 24 hr tablet TAKE 1/2 TABLETS AT BEDTIME 45 tablet 3   nitroGLYCERIN (NITROSTAT) 0.4 MG SL tablet Place 1 tablet (0.4 mg total) under the tongue every 5 (five) minutes as needed for chest pain. 25 tablet 2   Potassium Chloride ER 20 MEQ TBCR TAKE TWO TABLETS BY MOUTH TWICE A DAY 360 tablet 2   spironolactone (ALDACTONE) 25 MG tablet Take 0.5 tablets (12.5 mg total) by mouth daily. 45 tablet 3   torsemide (DEMADEX) 20 MG tablet Take 1 tablet (20 mg total) by mouth 2 (two) times daily. 60 tablet 11   Vitamin D, Ergocalciferol, (DRISDOL) 1.25 MG (50000 UNIT) CAPS capsule TAKE 1 CAPSULE BY MOUTH EVERY 7 DAYS (Patient taking differently: Take 50,000 Units by mouth every Wednesday.) 12 capsule 0   No current facility-administered medications for this visit.     Past Medical History:  Diagnosis Date   Arthritis    Atrial tachycardia 04/23/2017   CAD (coronary artery disease)    non  obstructive   Cardiac pacemaker in situ - St Jude 05/13/2014   Permanent pacemaker for second-degree heart block. Procedure complicated by an atrial lead dislodgment and repeat procedure, 12/2013    Carotid stenosis    Carotid US 5/16:  Bilateral ICA 1-39%; L vertebral retrograde; L BP 126/49, R BP 140/57   Chronic combined systolic and diastolic CHF (congestive heart failure) (HCC)    Chronic lower back pain    Colon cancer (HCC)    a. s/p chemo   Colon polyps    Diabetic peripheral neuropathy (HCC)    DVT (deep venous thrombosis) ([place]) 2013   "twice behind knee on left side" (06/25/2013)   Glaucoma    History of chicken pox    Hyperlipidemia    Hypertension    Hypothyroidism    Iron deficiency anemia    Multinodular goiter    Osteoporosis    PAF (paroxysmal atrial  fibrillation) ([place])    Sleep apnea    a. resolved post weight loss    Type II diabetes mellitus ([place])     ROS:   All systems reviewed and negative except as noted in the HPI.   Past Surgical History:  Procedure Laterality Date   CARDIAC CATHETERIZATION  2006   Never had PCI, 3 caths total. Last one in [place] N/A 02/04/2015   Procedure: Left Heart Cath and Coronary Angiography;  Surgeon: Belva Crome, MD;  Location: [facility];  Service: Cardiovascular;  Laterality: N/A;   CATARACT EXTRACTION, BILATERAL Bilateral 2013   "and put stent in my left eye for glaucoma" (06/25/2013)   CHOLECYSTECTOMY  1980's   COLECTOMY  10/2010   Tumor removal   EYE MUSCLE SURGERY Bilateral ~ 1963   "muscles too long; eyes would go out and up; tied muscles to hold my eyes straight" (06/25/2013)   LEAD REVISION  01-03-2014   atrial lead revision by Dr Rayann Heman   LEAD REVISION N/A 01/03/2014   Procedure: LEAD REVISION;  Surgeon: Coralyn Mark, MD;  Location: [facility] CATH LAB;  Service: Cardiovascular;  Laterality: N/A;   LEFT HEART CATH AND CORONARY ANGIOGRAPHY N/A 05/29/2018   Procedure: LEFT HEART CATH AND CORONARY ANGIOGRAPHY;  Surgeon: Belva Crome, MD;  Location: [facility];  Service: Cardiovascular;  Laterality: N/A;   PACEMAKER INSERTION  01/02/2014   STJ Assurity dual chamber pacemaker implnated by Dr Lovena Le for SSS   PERMANENT PACEMAKER INSERTION N/A 01/02/2014   Procedure: PERMANENT PACEMAKER INSERTION;  Surgeon: Evans Lance, MD;  Location: [facility] CATH LAB;  Service: Cardiovascular;  Laterality: N/A;   PORTACATH PLACEMENT Left 11/2010   ROUX-EN-Y GASTRIC BYPASS  2009   ULNAR TUNNEL RELEASE Left ~ 9735   UMBILICAL HERNIA REPAIR  1970's?    (06/25/2013)     Family History  Problem Relation Age of Onset   Breast cancer Mother    Diabetes Mother    Heart disease Father    Heart attack Father    Heart attack Sister    Heart attack Brother    Bladder Cancer  Brother    Prostate cancer Brother    Colon polyps Brother    Breast cancer Sister    Breast cancer Maternal Aunt        x 2 aunts   Pancreatic cancer Brother    Colon polyps Brother    Bladder Cancer Cousin    Clotting disorder Sister    Diabetes Sister  x 3   Diabetes Maternal Grandmother      Social History   Socioeconomic History   Marital status: Married    Spouse name: [name]   Number of children: 0   Years of education: Not on file   Highest education level: Not on file  Occupational History   Occupation: Retired    Comment: office work  Tobacco Use   Smoking status: Former    Packs/day: 1.00    Years: 30.00    Total pack years: 30.00    Types: Cigarettes    Quit date: 02/2017    Years since quitting: 5.4   Smokeless tobacco: Never   Tobacco comments:    started in her [age] until 2009   Vaping Use   Vaping Use: Never used  Substance and Sexual Activity   Alcohol use: Not Currently    Alcohol/week: 0.0 standard drinks of alcohol   Drug use: No   Sexual activity: Not on file  Other Topics Concern   Not on file  Social History Narrative   Married to husband, [name]   No children   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   Recently moved back here from [place] Strain: [place]  (12/01/2017)   Overall Financial Resource Strain (CARDIA)    Difficulty of Paying Living Expenses: Not very hard  Food Insecurity: No Food Insecurity (12/01/2017)   Hunger Vital Sign    Worried About Running Out of Food in the Last Year: Never true    Burns in the Last Year: Never true  Transportation Needs: No Transportation Needs (12/01/2017)   PRAPARE - Hydrologist (Medical): No    Lack of Transportation (Non-Medical): No  Physical Activity: Inactive (12/01/2017)   Exercise Vital Sign    Days of Exercise per Week: 0 days    Minutes of Exercise per  Session: 0 min  Stress: No Stress Concern Present (05/23/2019)   [place]    Feeling of Stress : Not at all  Social Connections: [place] (12/01/2017)   Social Connection and Isolation Panel [NHANES]    Frequency of Communication with Friends and Family: More than three times a week    Frequency of Social Gatherings with Friends and Family: More than three times a week    Attends Religious Services: More than 4 times per year    Active Member of [employer] or Organizations: Yes    Attends Music therapist: More than 4 times per year    Marital Status: Married  Human resources officer Violence: Not At Risk (12/01/2017)   Humiliation, Afraid, Rape, and Kick questionnaire    Fear of Current or Ex-Partner: No    Emotionally Abused: No    Physically Abused: No    Sexually Abused: No     BP (!) 142/88   Pulse 67   Ht '5\' 7"'$  (1.702 m)   Wt 204 lb (92.5 kg)   SpO2 95%   BMI 31.95 kg/m   Physical Exam:  Well appearing NAD HEENT: Unremarkable Neck:  No JVD, no thyromegally Lymphatics:  No adenopathy Back:  No CVA tenderness Lungs:  Clear with HEART:  Regular rate rhythm, no murmurs, no rubs, no clicks Abd:  soft, positive bowel sounds, no organomegally, no rebound, no guarding Ext:  2 plus pulses, no edema, no cyanosis, no  clubbing Skin:  No rashes no nodules Neuro:  CN II through XII intact, motor grossly intact  EKG - atrial fib with ventricular pacing  DEVICE  Normal device function.  See PaceArt for details. ERI  Assess/Plan: CHB - she is asymptomatic s/p PPM insertion. PPM -she is past ERI and willundergo gen change out is a couple of days. HTN -her bp is up. We will work on better control in the coming weeks. Chronic systolic/diastolic heart failure - we will see if she has an open left subclavian vein at gen change.   Mary [name] Mary Rockholt,MD

## 2022-08-13 NOTE — H&P (View-Only) (Signed)
HPI Mrs. Broder returns today after a long absence from our EP clinic. She is a pleasant 81 yo woman with permanent atrial fib and heart block s/p PPM insertion. Her device is past ERI. She has felt more sob. She has fatigue. She has been at [facility] for months. No chest pain or sob.  Allergies  Allergen Reactions   Other Other (See Comments)    NO Blind Scopes with Naso Gastric tube.  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   Latex     Tape= burns   Nsaids Other (See Comments)    S/P gastric bypass-told not to take. GI bleeds with NSAIDS     Current Outpatient Medications  Medication Sig Dispense Refill   albuterol (VENTOLIN HFA) 108 (90 Base) MCG/ACT inhaler Inhale 1-2 puffs into the lungs every 6 (six) hours as needed for wheezing or shortness of breath. 18 g 0   atorvastatin (LIPITOR) 80 MG tablet TAKE 1 TABLET(80 MG) BY MOUTH DAILY 90 tablet 0   baclofen (LIORESAL) 10 MG tablet Take 1 tablet (10 mg total) by mouth daily as needed for muscle spasms. 30 each 0   buPROPion (WELLBUTRIN XL) 150 MG 24 hr tablet TAKE 1 TABLET BY MOUTH EVERY DAY 90 tablet 1   cyanocobalamin (,VITAMIN B-12,) 1000 MCG/ML injection Inject 1 mL (1,000 mcg total) into the muscle every 30 (thirty) days. 10 mL 0   ELIQUIS 5 MG TABS tablet TAKE ONE TABLET BY MOUTH TWICE A DAY 180 tablet 1   empagliflozin (JARDIANCE) 10 MG TABS tablet Take 1 tablet (10 mg total) by mouth daily before breakfast.     HYDROcodone-acetaminophen (NORCO/VICODIN) 5-325 MG tablet Take 1 tablet by mouth every 6 (six) hours as needed. 30 tablet 0   insulin glargine (LANTUS) 100 UNIT/ML injection Inject into the skin daily. Sliding Scale     insulin lispro (HUMALOG) 100 UNIT/ML  injection Inject 20 Units into the skin as needed for high blood sugar. If blood sugar over 200.     levothyroxine (SYNTHROID) 137 MCG tablet Take 1 tablet (137 mcg total) by mouth daily. 90 tablet 3   metoprolol succinate (TOPROL-XL) 25 MG 24 hr tablet TAKE 1/2 TABLETS AT BEDTIME 45 tablet 3   nitroGLYCERIN (NITROSTAT) 0.4 MG SL tablet Place 1 tablet (0.4 mg total) under the tongue every 5 (five) minutes as needed for chest pain. 25 tablet 2   Potassium Chloride ER 20 MEQ TBCR TAKE TWO TABLETS BY MOUTH TWICE A DAY 360 tablet 2   spironolactone (ALDACTONE) 25 MG tablet Take 0.5 tablets (12.5 mg total) by mouth daily. 45 tablet 3   torsemide (DEMADEX) 20 MG tablet Take 1 tablet (20 mg total) by mouth 2 (two) times daily. 60 tablet 11   Vitamin D, Ergocalciferol, (DRISDOL) 1.25 MG (50000 UNIT) CAPS capsule TAKE 1 CAPSULE BY MOUTH EVERY 7 DAYS (Patient taking differently: Take 50,000 Units by mouth every Wednesday.) 12 capsule 0   No current facility-administered medications for this visit.     Past Medical History:  Diagnosis Date   Arthritis    Atrial tachycardia 04/23/2017   CAD (coronary artery disease)    non  obstructive   Cardiac pacemaker in situ - St Jude 05/13/2014   Permanent pacemaker for second-degree heart block. Procedure complicated by an atrial lead dislodgment and repeat procedure, 12/2013    Carotid stenosis    Carotid US 5/16:  Bilateral ICA 1-39%; L vertebral retrograde; L BP 126/49, R BP 140/57   Chronic combined systolic and diastolic CHF (congestive heart failure) (HCC)    Chronic lower back pain    Colon cancer (HCC)    a. s/p chemo   Colon polyps    Diabetic peripheral neuropathy (HCC)    DVT (deep venous thrombosis) ([place]) 2013   "twice behind knee on left side" (06/25/2013)   Glaucoma    History of chicken pox    Hyperlipidemia    Hypertension    Hypothyroidism    Iron deficiency anemia    Multinodular goiter    Osteoporosis    PAF (paroxysmal atrial  fibrillation) ([place])    Sleep apnea    a. resolved post weight loss    Type II diabetes mellitus ([place])     ROS:   All systems reviewed and negative except as noted in the HPI.   Past Surgical History:  Procedure Laterality Date   CARDIAC CATHETERIZATION  2006   Never had PCI, 3 caths total. Last one in [place] N/A 02/04/2015   Procedure: Left Heart Cath and Coronary Angiography;  Surgeon: Belva Crome, MD;  Location: [facility];  Service: Cardiovascular;  Laterality: N/A;   CATARACT EXTRACTION, BILATERAL Bilateral 2013   "and put stent in my left eye for glaucoma" (06/25/2013)   CHOLECYSTECTOMY  1980's   COLECTOMY  10/2010   Tumor removal   EYE MUSCLE SURGERY Bilateral ~ 1963   "muscles too long; eyes would go out and up; tied muscles to hold my eyes straight" (06/25/2013)   LEAD REVISION  01-03-2014   atrial lead revision by Dr Rayann Heman   LEAD REVISION N/A 01/03/2014   Procedure: LEAD REVISION;  Surgeon: Coralyn Mark, MD;  Location: [facility] CATH LAB;  Service: Cardiovascular;  Laterality: N/A;   LEFT HEART CATH AND CORONARY ANGIOGRAPHY N/A 05/29/2018   Procedure: LEFT HEART CATH AND CORONARY ANGIOGRAPHY;  Surgeon: Belva Crome, MD;  Location: [facility];  Service: Cardiovascular;  Laterality: N/A;   PACEMAKER INSERTION  01/02/2014   STJ Assurity dual chamber pacemaker implnated by Dr Lovena Le for SSS   PERMANENT PACEMAKER INSERTION N/A 01/02/2014   Procedure: PERMANENT PACEMAKER INSERTION;  Surgeon: Evans Lance, MD;  Location: [facility] CATH LAB;  Service: Cardiovascular;  Laterality: N/A;   PORTACATH PLACEMENT Left 11/2010   ROUX-EN-Y GASTRIC BYPASS  2009   ULNAR TUNNEL RELEASE Left ~ 4174   UMBILICAL HERNIA REPAIR  1970's?    (06/25/2013)     Family History  Problem Relation Age of Onset   Breast cancer Mother    Diabetes Mother    Heart disease Father    Heart attack Father    Heart attack Sister    Heart attack Brother    Bladder Cancer  Brother    Prostate cancer Brother    Colon polyps Brother    Breast cancer Sister    Breast cancer Maternal Aunt        x 2 aunts   Pancreatic cancer Brother    Colon polyps Brother    Bladder Cancer Cousin    Clotting disorder Sister    Diabetes Sister  x 3   Diabetes Maternal Grandmother      Social History   Socioeconomic History   Marital status: Married    Spouse name: [name]   Number of children: 0   Years of education: Not on file   Highest education level: Not on file  Occupational History   Occupation: Retired    Comment: office work  Tobacco Use   Smoking status: Former    Packs/day: 1.00    Years: 30.00    Total pack years: 30.00    Types: Cigarettes    Quit date: 02/2017    Years since quitting: 5.4   Smokeless tobacco: Never   Tobacco comments:    started in her [age] until 2009   Vaping Use   Vaping Use: Never used  Substance and Sexual Activity   Alcohol use: Not Currently    Alcohol/week: 0.0 standard drinks of alcohol   Drug use: No   Sexual activity: Not on file  Other Topics Concern   Not on file  Social History Narrative   Married to husband, [name]   No children   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   Recently moved back here from [place] Strain: [place]  (12/01/2017)   Overall Financial Resource Strain (CARDIA)    Difficulty of Paying Living Expenses: Not very hard  Food Insecurity: No Food Insecurity (12/01/2017)   Hunger Vital Sign    Worried About Running Out of Food in the Last Year: Never true    [place] in the Last Year: Never true  Transportation Needs: No Transportation Needs (12/01/2017)   PRAPARE - Hydrologist (Medical): No    Lack of Transportation (Non-Medical): No  Physical Activity: Inactive (12/01/2017)   Exercise Vital Sign    Days of Exercise per Week: 0 days    Minutes of Exercise per  Session: 0 min  Stress: No Stress Concern Present (05/23/2019)   [place]    Feeling of Stress : Not at all  Social Connections: [place] (12/01/2017)   Social Connection and Isolation Panel [NHANES]    Frequency of Communication with Friends and Family: More than three times a week    Frequency of Social Gatherings with Friends and Family: More than three times a week    Attends Religious Services: More than 4 times per year    Active Member of [employer] or Organizations: Yes    Attends Music therapist: More than 4 times per year    Marital Status: Married  Human resources officer Violence: Not At Risk (12/01/2017)   Humiliation, Afraid, Rape, and Kick questionnaire    Fear of Current or Ex-Partner: No    Emotionally Abused: No    Physically Abused: No    Sexually Abused: No     BP (!) 142/88   Pulse 67   Ht '5\' 7"'$  (1.702 m)   Wt 204 lb (92.5 kg)   SpO2 95%   BMI 31.95 kg/m   Physical Exam:  Well appearing NAD HEENT: Unremarkable Neck:  No JVD, no thyromegally Lymphatics:  No adenopathy Back:  No CVA tenderness Lungs:  Clear with HEART:  Regular rate rhythm, no murmurs, no rubs, no clicks Abd:  soft, positive bowel sounds, no organomegally, no rebound, no guarding Ext:  2 plus pulses, no edema, no cyanosis, no  clubbing Skin:  No rashes no nodules Neuro:  CN II through XII intact, motor grossly intact  EKG - atrial fib with ventricular pacing  DEVICE  Normal device function.  See PaceArt for details. ERI  Assess/Plan: CHB - she is asymptomatic s/p PPM insertion. PPM -she is past ERI and willundergo gen change out is a couple of days. HTN -her bp is up. We will work on better control in the coming weeks. Chronic systolic/diastolic heart failure - we will see if she has an open left subclavian vein at gen change.   Carleene Overlie Jacki Couse,MD

## 2022-08-14 LAB — CBC WITH DIFFERENTIAL/PLATELET
Basophils Absolute: 0.1 10*3/uL (ref 0.0–0.2)
Basos: 1 %
EOS (ABSOLUTE): 0.1 10*3/uL (ref 0.0–0.4)
Eos: 1 %
Hematocrit: 37.1 % (ref 34.0–46.6)
Hemoglobin: 12 g/dL (ref 11.1–15.9)
Immature Grans (Abs): 0 10*3/uL (ref 0.0–0.1)
Immature Granulocytes: 0 %
Lymphocytes Absolute: 1.9 10*3/uL (ref 0.7–3.1)
Lymphs: 26 %
MCH: 28.7 pg (ref 26.6–33.0)
MCHC: 32.3 g/dL (ref 31.5–35.7)
MCV: 89 fL (ref 79–97)
Monocytes Absolute: 0.5 10*3/uL (ref 0.1–0.9)
Monocytes: 6 %
Neutrophils Absolute: 4.9 10*3/uL (ref 1.4–7.0)
Neutrophils: 66 %
Platelets: 379 10*3/uL (ref 150–450)
RBC: 4.18 x10E6/uL (ref 3.77–5.28)
RDW: 13.1 % (ref 11.7–15.4)
WBC: 7.5 10*3/uL (ref 3.4–10.8)

## 2022-08-14 LAB — BASIC METABOLIC PANEL
BUN/Creatinine Ratio: 16 (ref 12–28)
BUN: 19 mg/dL (ref 8–27)
CO2: 28 mmol/L (ref 20–29)
Calcium: 9.5 mg/dL (ref 8.7–10.3)
Chloride: 99 mmol/L (ref 96–106)
Creatinine, Ser: 1.21 mg/dL — ABNORMAL HIGH (ref 0.57–1.00)
Glucose: 120 mg/dL — ABNORMAL HIGH (ref 70–99)
Potassium: 4.1 mmol/L (ref 3.5–5.2)
Sodium: 140 mmol/L (ref 134–144)
eGFR: 45 mL/min/{1.73_m2} — ABNORMAL LOW (ref >59)

## 2022-08-16 ENCOUNTER — Ambulatory Visit (HOSPITAL_COMMUNITY)
Admission: RE | Admit: 2022-08-16 | Discharge: 2022-08-16 | Disposition: A | Discharge status: Home / Self Care | Payer: Medicare Other | Attending: Internal Medicine | Admitting: Internal Medicine

## 2022-08-16 ENCOUNTER — Encounter (HOSPITAL_COMMUNITY)
Admission: RE | Disposition: A | Discharge status: Home / Self Care | Payer: Self-pay | Source: Home / Self Care | Attending: Internal Medicine

## 2022-08-16 ENCOUNTER — Other Ambulatory Visit: Payer: Self-pay

## 2022-08-16 DIAGNOSIS — Z45010 Encounter for checking and testing of cardiac pacemaker pulse generator [battery]: Secondary | ICD-10-CM | POA: Diagnosis present

## 2022-08-16 DIAGNOSIS — Z87891 Personal history of nicotine dependence: Secondary | ICD-10-CM | POA: Diagnosis not present

## 2022-08-16 DIAGNOSIS — Z8249 Family history of ischemic heart disease and other diseases of the circulatory system: Secondary | ICD-10-CM | POA: Insufficient documentation

## 2022-08-16 DIAGNOSIS — I442 Atrioventricular block, complete: Secondary | ICD-10-CM | POA: Insufficient documentation

## 2022-08-16 DIAGNOSIS — I110 Hypertensive heart disease with heart failure: Secondary | ICD-10-CM | POA: Insufficient documentation

## 2022-08-16 DIAGNOSIS — I5042 Chronic combined systolic (congestive) and diastolic (congestive) heart failure: Secondary | ICD-10-CM | POA: Insufficient documentation

## 2022-08-16 HISTORY — PX: PPM GENERATOR CHANGEOUT: EP1233

## 2022-08-16 LAB — GLUCOSE, CAPILLARY: Glucose-Capillary: 191 mg/dL — ABNORMAL HIGH (ref 70–99)

## 2022-08-16 SURGERY — PPM GENERATOR CHANGEOUT

## 2022-08-16 MED ORDER — LIDOCAINE HCL (PF) 1 % IJ SOLN
INTRAMUSCULAR | Status: DC | PRN
  Administered 2022-08-16: 60 mL

## 2022-08-16 MED ORDER — CEFAZOLIN SODIUM-DEXTROSE 2-4 GM/100ML-% IV SOLN
INTRAVENOUS | Status: AC
  Filled 2022-08-16: qty 100

## 2022-08-16 MED ORDER — ACETAMINOPHEN 325 MG PO TABS: 325.0000 mg | ORAL_TABLET | ORAL | Status: DC | PRN

## 2022-08-16 MED ORDER — CHLORHEXIDINE GLUCONATE 4 % EX LIQD
4.0000 | Freq: Once | CUTANEOUS | Status: DC
  Filled 2022-08-16: qty 60

## 2022-08-16 MED ORDER — CEFAZOLIN SODIUM-DEXTROSE 2-4 GM/100ML-% IV SOLN
2.0000 g | INTRAVENOUS | Status: AC
  Administered 2022-08-16: 2 g via INTRAVENOUS

## 2022-08-16 MED ORDER — ONDANSETRON HCL 4 MG/2ML IJ SOLN: 4.0000 mg | Freq: Four times a day (QID) | INTRAMUSCULAR | Status: DC | PRN

## 2022-08-16 MED ORDER — LIDOCAINE HCL (PF) 1 % IJ SOLN
INTRAMUSCULAR | Status: AC
  Filled 2022-08-16: qty 60

## 2022-08-16 MED ORDER — FENTANYL CITRATE (PF) 100 MCG/2ML IJ SOLN
INTRAMUSCULAR | Status: AC
  Filled 2022-08-16: qty 2

## 2022-08-16 MED ORDER — MIDAZOLAM HCL 5 MG/5ML IJ SOLN
INTRAMUSCULAR | Status: AC
  Filled 2022-08-16: qty 5

## 2022-08-16 MED ORDER — POVIDONE-IODINE 10 % EX SWAB
2.0000 | Freq: Once | CUTANEOUS | Status: AC
  Administered 2022-08-16: 2 via TOPICAL

## 2022-08-16 MED ORDER — SODIUM CHLORIDE 0.9 % IV SOLN: INTRAVENOUS | Status: DC

## 2022-08-16 MED ORDER — SODIUM CHLORIDE 0.9 % IV SOLN
INTRAVENOUS | Status: AC
  Filled 2022-08-16: qty 2

## 2022-08-16 MED ORDER — SODIUM CHLORIDE 0.9 % IV SOLN
80.0000 mg | INTRAVENOUS | Status: AC
  Administered 2022-08-16: 80 mg

## 2022-08-16 SURGICAL SUPPLY — 4 items
CABLE SURGICAL S-101-97-12 (CABLE) ×1 IMPLANT
PACEMAKER ASSURITY DR-RF (Pacemaker) IMPLANT
PAD DEFIB RADIO PHYSIO CONN (PAD) ×1 IMPLANT
TRAY PACEMAKER INSERTION (PACKS) ×1 IMPLANT

## 2022-08-16 NOTE — Discharge Instructions (Signed)

## 2022-08-16 NOTE — Interval H&P Note (Signed)
History and Physical Interval Note:  08/16/2022 3:09 PM  Mary [name]  has presented today for surgery, with the diagnosis of ERI.  The various methods of treatment have been discussed with the patient and family. After consideration of risks, benefits and other options for treatment, the patient has consented to  Procedure(s): PPM GENERATOR CHANGEOUT (N/A) as a surgical intervention.  The patient's history has been reviewed, patient examined, no change in status, stable for surgery.  I have reviewed the patient's chart and labs.  Questions were answered to the patient's satisfaction.     Cristopher Peru

## 2022-08-17 ENCOUNTER — Encounter (HOSPITAL_COMMUNITY): Payer: Self-pay | Admitting: Internal Medicine

## 2022-09-02 ENCOUNTER — Ambulatory Visit: Payer: Medicare Other

## 2022-09-08 ENCOUNTER — Other Ambulatory Visit: Payer: Self-pay | Admitting: Internal Medicine

## 2022-09-09 ENCOUNTER — Ambulatory Visit: Payer: Medicare Other | Attending: Cardiology

## 2022-09-09 DIAGNOSIS — I442 Atrioventricular block, complete: Secondary | ICD-10-CM | POA: Diagnosis not present

## 2022-09-09 LAB — CUP PACEART INCLINIC DEVICE CHECK
Battery Remaining Longevity: 80 mo
Battery Voltage: 3.02 V
Brady Statistic RA Percent Paced: 99.57 %
Brady Statistic RV Percent Paced: 99.87 %
Date Time Interrogation Session: 20240111145301
Implantable Lead Connection Status: 753985
Implantable Lead Connection Status: 753985
Implantable Lead Implant Date: 20150506
Implantable Lead Implant Date: 20150506
Implantable Lead Location: 753859
Implantable Lead Location: 753860
Implantable Pulse Generator Implant Date: 20231218
Lead Channel Impedance Value: 387.5 Ohm
Lead Channel Impedance Value: 437.5 Ohm
Lead Channel Pacing Threshold Amplitude: 0.75 V
Lead Channel Pacing Threshold Amplitude: 0.75 V
Lead Channel Pacing Threshold Amplitude: 0.75 V
Lead Channel Pacing Threshold Amplitude: 0.75 V
Lead Channel Pacing Threshold Pulse Width: 0.5 ms
Lead Channel Pacing Threshold Pulse Width: 0.5 ms
Lead Channel Pacing Threshold Pulse Width: 0.5 ms
Lead Channel Pacing Threshold Pulse Width: 0.5 ms
Lead Channel Sensing Intrinsic Amplitude: 10.3 mV
Lead Channel Setting Pacing Amplitude: 2.5 V
Lead Channel Setting Pacing Amplitude: 2.5 V
Lead Channel Setting Pacing Pulse Width: 0.5 ms
Lead Channel Setting Sensing Sensitivity: 4 mV
Pulse Gen Model: 2272
Pulse Gen Serial Number: 8128617

## 2022-09-09 NOTE — Patient Instructions (Signed)

## 2022-09-09 NOTE — Progress Notes (Signed)
Wound check appointment. Steri-strips removed. Wound without redness or edema. Incision edges approximated, wound well healed. Normal device function. Thresholds, sensing, and impedances consistent with implant measurements. Device programmed chronic output settings post gen change. Histogram distribution appropriate for patient and level of activity. No mode switches or high ventricular rates noted. Patient educated about wound care. ROV in 3 months with implanting physician.

## 2022-09-09 NOTE — Progress Notes (Signed)
Remote pacemaker transmission.   

## 2022-09-10 ENCOUNTER — Ambulatory Visit: Payer: Medicare Other | Admitting: Internal Medicine

## 2022-09-15 ENCOUNTER — Other Ambulatory Visit: Payer: Self-pay | Admitting: Interventional Cardiology

## 2022-09-20 ENCOUNTER — Ambulatory Visit: Payer: Medicare Other | Admitting: Internal Medicine

## 2022-09-27 ENCOUNTER — Encounter: Payer: Self-pay | Admitting: Internal Medicine

## 2022-09-27 ENCOUNTER — Ambulatory Visit: Payer: Medicare Other | Admitting: Internal Medicine

## 2022-09-27 VITALS — BP 128/84 | HR 74 | Temp 97.5°F | Ht 67.0 in | Wt 205.0 lb

## 2022-09-27 DIAGNOSIS — E1169 Type 2 diabetes mellitus with other specified complication: Secondary | ICD-10-CM

## 2022-09-27 DIAGNOSIS — Z720 Tobacco use: Secondary | ICD-10-CM

## 2022-09-27 DIAGNOSIS — I10 Essential (primary) hypertension: Secondary | ICD-10-CM

## 2022-09-27 DIAGNOSIS — E785 Hyperlipidemia, unspecified: Secondary | ICD-10-CM

## 2022-09-27 DIAGNOSIS — Z85038 Personal history of other malignant neoplasm of large intestine: Secondary | ICD-10-CM

## 2022-09-27 DIAGNOSIS — N3946 Mixed incontinence: Secondary | ICD-10-CM

## 2022-09-27 DIAGNOSIS — T451X5A Adverse effect of antineoplastic and immunosuppressive drugs, initial encounter: Secondary | ICD-10-CM

## 2022-09-27 DIAGNOSIS — I25118 Atherosclerotic heart disease of native coronary artery with other forms of angina pectoris: Secondary | ICD-10-CM

## 2022-09-27 DIAGNOSIS — M5416 Radiculopathy, lumbar region: Secondary | ICD-10-CM

## 2022-09-27 DIAGNOSIS — E118 Type 2 diabetes mellitus with unspecified complications: Secondary | ICD-10-CM

## 2022-09-27 DIAGNOSIS — Z23 Encounter for immunization: Secondary | ICD-10-CM | POA: Diagnosis not present

## 2022-09-27 DIAGNOSIS — N1832 Chronic kidney disease, stage 3b: Secondary | ICD-10-CM

## 2022-09-27 DIAGNOSIS — F3341 Major depressive disorder, recurrent, in partial remission: Secondary | ICD-10-CM

## 2022-09-27 DIAGNOSIS — I442 Atrioventricular block, complete: Secondary | ICD-10-CM

## 2022-09-27 DIAGNOSIS — J449 Chronic obstructive pulmonary disease, unspecified: Secondary | ICD-10-CM

## 2022-09-27 DIAGNOSIS — G629 Polyneuropathy, unspecified: Secondary | ICD-10-CM | POA: Diagnosis not present

## 2022-09-27 DIAGNOSIS — I5042 Chronic combined systolic (congestive) and diastolic (congestive) heart failure: Secondary | ICD-10-CM | POA: Diagnosis not present

## 2022-09-27 DIAGNOSIS — G620 Drug-induced polyneuropathy: Secondary | ICD-10-CM

## 2022-09-27 DIAGNOSIS — I480 Paroxysmal atrial fibrillation: Secondary | ICD-10-CM

## 2022-09-27 LAB — COMPREHENSIVE METABOLIC PANEL
ALT: 6 U/L (ref 0–35)
AST: 11 U/L (ref 0–37)
Albumin: 4 g/dL (ref 3.5–5.2)
Alkaline Phosphatase: 96 U/L (ref 39–117)
BUN: 21 mg/dL (ref 6–23)
CO2: 28 mEq/L (ref 19–32)
Calcium: 8.9 mg/dL (ref 8.4–10.5)
Chloride: 102 mEq/L (ref 96–112)
Creatinine, Ser: 1.21 mg/dL — ABNORMAL HIGH (ref 0.40–1.20)
GFR: 42.24 mL/min — ABNORMAL LOW (ref >60.00)
Glucose, Bld: 220 mg/dL — ABNORMAL HIGH (ref 70–99)
Potassium: 4.1 mEq/L (ref 3.5–5.1)
Sodium: 137 mEq/L (ref 135–145)
Total Bilirubin: 0.4 mg/dL (ref 0.2–1.2)
Total Protein: 7.5 g/dL (ref 6.0–8.3)

## 2022-09-27 LAB — LIPID PANEL
Cholesterol: 241 mg/dL — ABNORMAL HIGH (ref 0–200)
HDL: 49.6 mg/dL (ref >39.00)
NonHDL: 191.33
Total CHOL/HDL Ratio: 5
Triglycerides: 303 mg/dL — ABNORMAL HIGH (ref 0.0–149.0)
VLDL: 60.6 mg/dL — ABNORMAL HIGH (ref 0.0–40.0)

## 2022-09-27 LAB — LDL CHOLESTEROL, DIRECT: Direct LDL: 152 mg/dL

## 2022-09-27 LAB — VITAMIN B12: Vitamin B-12: 331 pg/mL (ref 211–911)

## 2022-09-27 LAB — HEMOGLOBIN A1C: Hgb A1c MFr Bld: 10.4 % — ABNORMAL HIGH (ref 4.6–6.5)

## 2022-09-27 MED ORDER — MIRABEGRON ER 50 MG PO TB24: 50.0000 mg | ORAL_TABLET | Freq: Every day | ORAL | 1 refills | Status: DC

## 2022-09-27 MED ORDER — HYDROCODONE-ACETAMINOPHEN 5-325 MG PO TABS: 1.0000 | ORAL_TABLET | Freq: Four times a day (QID) | ORAL | 0 refills | Status: DC | PRN

## 2022-09-27 NOTE — Progress Notes (Unsigned)
   Subjective:   Patient ID: Mary [name], female    DOB: September 17, 1940, 82 y.o.   MRN: [id number]  HPI The patient is an 82 YO female coming in for multiple medical problems.   Review of Systems  Constitutional:  Positive for activity change.  HENT: Negative.    Eyes: Negative.   Respiratory:  Positive for shortness of breath. Negative for cough and chest tightness.   Cardiovascular:  Positive for leg swelling. Negative for chest pain and palpitations.  Gastrointestinal:  Negative for abdominal distention, abdominal pain, constipation, diarrhea, nausea and vomiting.  Genitourinary:  Positive for enuresis and urgency.  Musculoskeletal:  Positive for arthralgias, gait problem and myalgias.  Skin: Negative.   Neurological:  Positive for numbness.  Psychiatric/Behavioral:  Positive for sleep disturbance.     Objective:  Physical Exam Constitutional:      Appearance: She is well-developed. She is obese. She is ill-appearing.  HENT:     Head: Normocephalic and atraumatic.  Cardiovascular:     Rate and Rhythm: Normal rate and regular rhythm.  Pulmonary:     Effort: Pulmonary effort is normal. No respiratory distress.     Breath sounds: Normal breath sounds. No wheezing or rales.  Abdominal:     General: Bowel sounds are normal. There is no distension.     Palpations: Abdomen is soft.     Tenderness: There is no abdominal tenderness. There is no rebound.  Musculoskeletal:        General: Tenderness present.     Cervical back: Normal range of motion.  Skin:    General: Skin is warm and dry.  Neurological:     Mental Status: She is alert and oriented to person, place, and time.     Sensory: Sensory deficit present.     Coordination: Coordination abnormal.     Vitals:   09/27/22 1440  BP: 128/84  Pulse: 74  Temp: (!) 97.5 F (36.4 C)  TempSrc: Oral  SpO2: 93%  Height: '5\' 7"'$  (1.702 m)   Visit time 30 minutes in face to face communication with patient and coordination of  care, additional 10 minutes spent in record review, coordination or care, ordering tests, communicating/referring to other healthcare professionals, documenting in medical records all on the same day of the visit for total time 40 minutes spent on the visit.   Assessment & Plan:  Flu shot given at visit

## 2022-09-27 NOTE — Patient Instructions (Addendum)
We will check the labs today,  We have sent in myrbetriq to take 1 pill daily to help the bladder. It can take 1 month to fully kick in.  We did refill the hydrocodone.

## 2022-09-28 ENCOUNTER — Encounter: Payer: Self-pay | Admitting: Internal Medicine

## 2022-09-28 DIAGNOSIS — N3946 Mixed incontinence: Secondary | ICD-10-CM | POA: Insufficient documentation

## 2022-09-28 DIAGNOSIS — G620 Drug-induced polyneuropathy: Secondary | ICD-10-CM | POA: Insufficient documentation

## 2022-09-28 DIAGNOSIS — N183 Chronic kidney disease, stage 3 unspecified: Secondary | ICD-10-CM | POA: Insufficient documentation

## 2022-09-28 LAB — CBC
HCT: 37.9 % (ref 36.0–46.0)
Hemoglobin: 12.6 g/dL (ref 12.0–15.0)
MCHC: 33.3 g/dL (ref 30.0–36.0)
MCV: 88.8 fl (ref 78.0–100.0)
Platelets: 376 10*3/uL (ref 150.0–400.0)
RBC: 4.26 Mil/uL (ref 3.87–5.11)
RDW: 14.2 % (ref 11.5–15.5)
WBC: 7 10*3/uL (ref 4.0–10.5)

## 2022-09-28 LAB — VITAMIN D 25 HYDROXY (VIT D DEFICIENCY, FRACTURES): VITD: 28.58 ng/mL — ABNORMAL LOW (ref 30.00–100.00)

## 2022-09-28 NOTE — Assessment & Plan Note (Signed)
She is not in flare today. She has stopped taking torsemide due to urination often. She is still taking spironolactone 12.5 mg daily and advised to continue. She is taking jardiance 10 mg daily and metoprolol 12.5 mg daily which we will continue. Some SOB which is chronic and likely related to her COPD. No flare today volume status is stable.

## 2022-09-28 NOTE — Assessment & Plan Note (Signed)
No flare today but does have SOB on exertion. She uses albuterol prn and does not need refill today. Has quit smoking and encouraged to stay quit for life.

## 2022-09-28 NOTE — Assessment & Plan Note (Signed)
S/P treatment and likely not eligible for ongoing surveillance due to overall health.

## 2022-09-28 NOTE — Assessment & Plan Note (Signed)
Uses rare hydrocodone 5/325 for pain and last prescribed about 6 months ago for #30. Refilled today for #30. Encouraged rare use and try tylenol first for pain. She is unable to take NSAIDs due to eliquis for her A fib.

## 2022-09-28 NOTE — Assessment & Plan Note (Signed)
Sounds regular on exam, pacemaker in place. Metoprolol 12.5 mg daily for rate control and on eliquis 5 mg BID for NOAC. No signs of clinical bleeding checking CMP and CBC today. Adjust as needed.

## 2022-09-28 NOTE — Assessment & Plan Note (Signed)
Previous GFR 42 and rechecking CMP today to assess. BP at goal but diabetes poorly controlled. Checking microalbumin to creatinine ratio as well urine. Adjust as needed. Not on ARB/ACE-I currently.

## 2022-09-28 NOTE — Assessment & Plan Note (Signed)
Has quit for now and encouraged to stay quit long term. Reminded about risk from smoking.

## 2022-09-28 NOTE — Assessment & Plan Note (Signed)
She is taking metoprolol 12.5 mg daily and atorvastatin 80 mg daily and eliquis 5 mg BID. Some current SOB on exertion which could be angina equivalent.

## 2022-09-28 NOTE — Assessment & Plan Note (Signed)
BP at goal on metoprolol 12.5 mg daily and spironolactone 12.5 mg daily and checking CMP and CBC and adjust as needed.

## 2022-09-28 NOTE — Assessment & Plan Note (Signed)
S/P recent pacemaker switch and healing well. No signs of infection on exam. She is feeling some more energy since the switch.

## 2022-09-28 NOTE — Assessment & Plan Note (Signed)
Checking lipid panel and adjust lipitor 80 mg daily for goal LDL<70.

## 2022-09-28 NOTE — Assessment & Plan Note (Signed)
Suspect mixed etiology including diuretics, poorly controlled diabetes. Rx myrbetriq 50 mg daily and referral to urogyn per patient request.

## 2022-09-28 NOTE — Assessment & Plan Note (Signed)
Poorly controlled and last HgA1c was 9.7 which is moderately elevated. She is having worsening urinary incontinence since last visit. We discussed possibility of bladder neuropathy from longstanding poorly controlled diabetes. She is on jardiance 10 mg daily. Checking CMP and HgA1c and microalbumin to creatinine ratio. If able will increase jardiance to 25 mg daily. She is taking both short acting and long acting insulin but not checking sugars as often as desired. She keeps track of them on her calendar. She has neuropathy which is progressive since last visit.

## 2022-09-28 NOTE — Assessment & Plan Note (Signed)
Neuropathy likely related partially to chemo since it started during colon cancer treatment but likely exacerbated by poorly controlled diabetes. Checking HgA1c and B12 and vitamin D and adjust treatment as needed. She is taking otc for this mostly. Reminded about foot care.

## 2022-09-28 NOTE — Assessment & Plan Note (Signed)
Overall decent control but still mild symptoms at this time. Taking wellbutrin 150 mg daily and will continue. She does not want to adjust medication today.

## 2022-09-30 ENCOUNTER — Encounter: Payer: Self-pay | Admitting: Internal Medicine

## 2022-10-05 ENCOUNTER — Telehealth: Payer: Self-pay

## 2022-10-05 NOTE — Telephone Encounter (Signed)
Spoke with patient today and told her that her referral was still pending and has not yet been released

## 2022-10-06 ENCOUNTER — Telehealth: Payer: Self-pay

## 2022-10-07 NOTE — Telephone Encounter (Signed)
What kind of doctor is this and reason for referral?

## 2022-10-08 NOTE — Telephone Encounter (Signed)
Called patient and there was no answer will have to call back

## 2022-10-08 NOTE — Telephone Encounter (Signed)
PT calls back today following up and I was able to confirm that Dr.Ann [place] Pink is a provider through [facility] Urogynecology department. She was needing the referral due to the urinary incontinence brought up during her last visit (01/29).  When gathering this information from the patient I had noticed there was already a referral in place [facility]. [facility]?   Eventually PT was disgruntled, upset and hung up on me. Before doing so she stated she could find someone else to do the referral if I couldn't confirm it had already be done.   CB: [phone]

## 2022-10-08 NOTE — Telephone Encounter (Signed)
PT calls back today and states she would not be returning to the practice.

## 2022-10-12 ENCOUNTER — Other Ambulatory Visit: Payer: Self-pay

## 2022-10-12 MED ORDER — POTASSIUM CHLORIDE ER 20 MEQ PO TBCR: 2.0000 | EXTENDED_RELEASE_TABLET | Freq: Two times a day (BID) | ORAL | 0 refills | Status: DC

## 2022-10-12 MED ORDER — METOPROLOL SUCCINATE ER 25 MG PO TB24: ORAL_TABLET | ORAL | 0 refills | Status: DC

## 2022-10-12 MED ORDER — SPIRONOLACTONE 25 MG PO TABS: 12.5000 mg | ORAL_TABLET | Freq: Every day | ORAL | 0 refills | Status: DC

## 2022-11-16 ENCOUNTER — Ambulatory Visit (INDEPENDENT_AMBULATORY_CARE_PROVIDER_SITE_OTHER): Payer: Medicare Other

## 2022-11-16 ENCOUNTER — Telehealth: Payer: Self-pay

## 2022-11-16 DIAGNOSIS — I441 Atrioventricular block, second degree: Secondary | ICD-10-CM | POA: Diagnosis not present

## 2022-11-16 LAB — CUP PACEART REMOTE DEVICE CHECK
Battery Remaining Longevity: 83 mo
Battery Remaining Percentage: 95.5 %
Battery Voltage: 3.01 V
Brady Statistic AP VP Percent: 99 %
Brady Statistic AP VS Percent: 1 %
Brady Statistic AS VP Percent: 1 %
Brady Statistic AS VS Percent: 1 %
Brady Statistic RA Percent Paced: 99 %
Brady Statistic RV Percent Paced: 99 %
Date Time Interrogation Session: 20240319020014
Implantable Lead Connection Status: 753985
Implantable Lead Connection Status: 753985
Implantable Lead Implant Date: 20150506
Implantable Lead Implant Date: 20150506
Implantable Lead Location: 753859
Implantable Lead Location: 753860
Implantable Pulse Generator Implant Date: 20231218
Lead Channel Impedance Value: 390 Ohm
Lead Channel Impedance Value: 480 Ohm
Lead Channel Pacing Threshold Amplitude: 0.75 V
Lead Channel Pacing Threshold Amplitude: 0.75 V
Lead Channel Pacing Threshold Pulse Width: 0.5 ms
Lead Channel Pacing Threshold Pulse Width: 0.5 ms
Lead Channel Sensing Intrinsic Amplitude: 0.5 mV
Lead Channel Sensing Intrinsic Amplitude: 9.2 mV
Lead Channel Setting Pacing Amplitude: 2.5 V
Lead Channel Setting Pacing Amplitude: 2.5 V
Lead Channel Setting Pacing Pulse Width: 0.5 ms
Lead Channel Setting Sensing Sensitivity: 4 mV
Pulse Gen Model: 2272
Pulse Gen Serial Number: 8128617

## 2022-11-16 NOTE — Telephone Encounter (Signed)
Following alert received from [employer] received for Scheduled remote reviewed. Normal device function.   Presenting markers AP/VP, no V channel tracing, atrial channel appears to be undersensing - route to triage. Alert status for RV pacing > limit, PPM indication heart block, discontinue alert in [place] per protocol.  Attempted to contact patient to make device clinic apt to check ppm and possible reprogramming for RA undersensing and assess RV channel. No answer, LMTCB.    RA/RV pacing 99%. Lead parameters appear WNL.

## 2022-11-17 NOTE — Telephone Encounter (Signed)
Attempted to contact patient to make DC apt. No answer, LMTCB.

## 2022-11-18 NOTE — Telephone Encounter (Signed)
Attempted to contact Pt.  Sent mychart message requesting a manual transmission and call back to device clinic.

## 2022-11-19 NOTE — Telephone Encounter (Signed)
Pt has upcoming apt on 11/25/22 with GT

## 2022-11-25 ENCOUNTER — Ambulatory Visit: Payer: Medicare Other | Attending: Internal Medicine | Admitting: Internal Medicine

## 2022-11-25 ENCOUNTER — Encounter: Payer: Self-pay | Admitting: Internal Medicine

## 2022-11-25 ENCOUNTER — Ambulatory Visit: Payer: Medicare Other

## 2022-11-25 VITALS — BP 146/72 | HR 75 | Ht 67.0 in | Wt 222.8 lb

## 2022-11-25 DIAGNOSIS — I10 Essential (primary) hypertension: Secondary | ICD-10-CM

## 2022-11-25 DIAGNOSIS — Z950 Presence of cardiac pacemaker: Secondary | ICD-10-CM

## 2022-11-25 DIAGNOSIS — I442 Atrioventricular block, complete: Secondary | ICD-10-CM

## 2022-11-25 DIAGNOSIS — I4719 Other supraventricular tachycardia: Secondary | ICD-10-CM

## 2022-11-25 LAB — CUP PACEART INCLINIC DEVICE CHECK
Battery Remaining Longevity: 80 mo
Battery Voltage: 3.01 V
Brady Statistic RA Percent Paced: 0.8 %
Brady Statistic RV Percent Paced: 99.85 %
Date Time Interrogation Session: 20240328195008
Implantable Lead Connection Status: 753985
Implantable Lead Connection Status: 753985
Implantable Lead Implant Date: 20150506
Implantable Lead Implant Date: 20150506
Implantable Lead Location: 753859
Implantable Lead Location: 753860
Implantable Pulse Generator Implant Date: 20231218
Lead Channel Sensing Intrinsic Amplitude: 0.3 mV
Lead Channel Sensing Intrinsic Amplitude: 9.2 mV
Lead Channel Setting Pacing Amplitude: 2.5 V
Lead Channel Setting Pacing Amplitude: 2.5 V
Lead Channel Setting Pacing Pulse Width: 0.5 ms
Lead Channel Setting Sensing Sensitivity: 4 mV
Pulse Gen Model: 2272
Pulse Gen Serial Number: 8128617

## 2022-11-25 NOTE — Progress Notes (Unsigned)
Enrolled for [employer] to mail a ZIO XT long term holter monitor to the patients address on file.  

## 2022-11-25 NOTE — Progress Notes (Signed)
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 She is a pleasant 82 yo woman with parosymal atrial fib and heart block s/p PPM insertion. Her device underwent change out a little over 3 months ago. She has felt more sob. She has fatigue. No chest pain or sob. She was noted on her remote to have some undersensing in the [facility]. Allergies  Allergen Reactions   Other Other (See Comments)    NO Blind Scopes with Naso Gastric tube.  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   Latex     Tape= burns   Nsaids Other (See Comments)    S/P gastric bypass-told not to take. GI bleeds with NSAIDS     Current Outpatient Medications  Medication Sig Dispense Refill   albuterol (VENTOLIN HFA) 108 (90 Base) MCG/ACT inhaler Inhale 1-2 puffs into the lungs every 6 (six) hours as needed for wheezing or shortness of breath. 18 g 0   atorvastatin (LIPITOR) 80 MG tablet TAKE 1 TABLET(80 MG) BY MOUTH DAILY 90 tablet 0   baclofen (LIORESAL) 10 MG tablet Take 1 tablet (10 mg total) by mouth daily as needed for muscle spasms. 30 each 0   buPROPion (WELLBUTRIN XL) 150 MG 24 hr tablet TAKE 1 TABLET BY MOUTH EVERY DAY 90 tablet 1   cyanocobalamin (VITAMIN B12) 1000 MCG/ML injection INJECT 1ML INTO THE MUSCLE EVERY 30 DAYS 1 mL 1   ELIQUIS 5 MG TABS tablet TAKE ONE TABLET BY MOUTH TWICE A DAY 180 tablet 1   empagliflozin (JARDIANCE) 10 MG TABS tablet Take 1 tablet (10 mg total) by mouth daily before breakfast.     HYDROcodone-acetaminophen (NORCO/VICODIN) 5-325 MG tablet Take 1 tablet by mouth every 6 (six) hours as needed. 30 tablet 0   insulin glargine (LANTUS) 100 UNIT/ML injection Inject into the skin daily. Sliding Scale      insulin lispro (HUMALOG) 100 UNIT/ML injection Inject 20 Units into the skin as needed for high blood sugar. If blood sugar over 200.     levothyroxine (SYNTHROID) 137 MCG tablet Take 1 tablet (137 mcg total) by mouth daily. 90 tablet 3   metoprolol succinate (TOPROL-XL) 25 MG 24 hr tablet TAKE 1/2 TABLETS AT BEDTIME 45 tablet 0   nitroGLYCERIN (NITROSTAT) 0.4 MG SL tablet Place 1 tablet (0.4 mg total) under the tongue every 5 (five) minutes as needed for chest pain. 25 tablet 2   Potassium Chloride ER 20 MEQ TBCR Take 2 tablets (40 mEq total) by mouth 2 (two) times daily. 360 tablet 0   spironolactone (ALDACTONE) 25 MG tablet Take 0.5 tablets (12.5 mg total) by mouth daily. 45 tablet 0   torsemide (DEMADEX) 20 MG tablet TAKE ONE TABLET BY MOUTH TWICE A DAY 60 tablet 3   Vitamin D, Ergocalciferol, (DRISDOL) 1.25 MG (50000 UNIT) CAPS capsule TAKE 1 CAPSULE BY MOUTH EVERY 7 DAYS (Patient taking differently: Take 50,000 Units by mouth every Wednesday.) 12 capsule 0   No current facility-administered medications for this visit.     Past Medical History:  Diagnosis Date   Arthritis    Atrial tachycardia 04/23/2017   CAD (coronary  artery disease)    non obstructive   Cardiac pacemaker in situ - St Jude 05/13/2014   Permanent pacemaker for second-degree heart block. Procedure complicated by an atrial lead dislodgment and repeat procedure, 12/2013    Carotid stenosis    Carotid US 5/16:  Bilateral ICA 1-39%; L vertebral retrograde; L BP 126/49, R BP 140/57   Chronic combined systolic and diastolic CHF (congestive heart failure) (HCC)    Chronic lower back pain    Colon cancer (HCC)    a. s/p chemo   Colon polyps    Diabetic peripheral neuropathy (HCC)    DVT (deep venous thrombosis) ([place]) 2013   "twice behind knee on left side" (06/25/2013)   Glaucoma    History of chicken pox    Hyperlipidemia    Hypertension    Hypothyroidism    Iron deficiency anemia    Multinodular goiter     Osteoporosis    PAF (paroxysmal atrial fibrillation) ([place])    Sleep apnea    a. resolved post weight loss    Type II diabetes mellitus ([place])     ROS:   All systems reviewed and negative except as noted in the HPI.   Past Surgical History:  Procedure Laterality Date   CARDIAC CATHETERIZATION  2006   Never had PCI, 3 caths total. Last one in [place] N/A 02/04/2015   Procedure: Left Heart Cath and Coronary Angiography;  Surgeon: Belva Crome, MD;  Location: [place] [facility];  Service: Cardiovascular;  Laterality: N/A;   CATARACT EXTRACTION, BILATERAL Bilateral 2013   "and put stent in my left eye for glaucoma" (06/25/2013)   CHOLECYSTECTOMY  1980's   COLECTOMY  10/2010   Tumor removal   EYE MUSCLE SURGERY Bilateral ~ 1963   "muscles too long; eyes would go out and up; tied muscles to hold my eyes straight" (06/25/2013)   LEAD REVISION  01-03-2014   atrial lead revision by Dr Rayann Heman   LEAD REVISION N/A 01/03/2014   Procedure: LEAD REVISION;  Surgeon: Coralyn Mark, MD;  Location: [facility] CATH LAB;  Service: Cardiovascular;  Laterality: N/A;   LEFT HEART CATH AND CORONARY ANGIOGRAPHY N/A 05/29/2018   Procedure: LEFT HEART CATH AND CORONARY ANGIOGRAPHY;  Surgeon: Belva Crome, MD;  Location: [place] [facility];  Service: Cardiovascular;  Laterality: N/A;   PACEMAKER INSERTION  01/02/2014   STJ Assurity dual chamber pacemaker implnated by Dr Lovena Le for SSS   PERMANENT PACEMAKER INSERTION N/A 01/02/2014   Procedure: PERMANENT PACEMAKER INSERTION;  Surgeon: Evans Lance, MD;  Location: [facility] CATH LAB;  Service: Cardiovascular;  Laterality: N/A;   PORTACATH PLACEMENT Left 11/2010   PPM GENERATOR CHANGEOUT N/A 08/16/2022   Procedure: PPM GENERATOR CHANGEOUT;  Surgeon: Evans Lance, MD;  Location: [place] [facility];  Service: Cardiovascular;  Laterality: N/A;   ROUX-EN-Y GASTRIC BYPASS  2009   ULNAR TUNNEL RELEASE Left ~ [id number]   UMBILICAL HERNIA REPAIR  1970's?     (06/25/2013)     Family History  Problem Relation Age of Onset   Breast cancer Mother    Diabetes Mother    Heart disease Father    Heart attack Father    Heart attack Sister    Heart attack Brother    Bladder Cancer Brother    Prostate cancer Brother    Colon polyps Brother    Breast cancer Sister    Breast cancer Maternal Aunt  x 2 aunts   Pancreatic cancer Brother    Colon polyps Brother    Bladder Cancer Cousin    Clotting disorder Sister    Diabetes Sister        x 3   Diabetes Maternal Grandmother      Social History   Socioeconomic History   Marital status: Married    Spouse name: [name]   Number of children: 0   Years of education: Not on file   Highest education level: Not on file  Occupational History   Occupation: Retired    Comment: office work  Tobacco Use   Smoking status: Former    Packs/day: 1.00    Years: 30.00    Additional pack years: 0.00    Total pack years: 30.00    Types: Cigarettes    Quit date: 02/2017    Years since quitting: 5.7   Smokeless tobacco: Never   Tobacco comments:    started in her [age] until 2009   Vaping Use   Vaping Use: Never used  Substance and Sexual Activity   Alcohol use: Not Currently    Alcohol/week: 0.0 standard drinks of alcohol   Drug use: No   Sexual activity: Not on file  Other Topics Concern   Not on file  Social History Narrative   Married to husband, [name]   No children   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   Recently moved back here from [place] Strain: [place]  (12/01/2017)   Overall Financial Resource Strain (CARDIA)    Difficulty of Paying Living Expenses: Not very hard  Food Insecurity: No Food Insecurity (12/01/2017)   Hunger Vital Sign    Worried About Running Out of Food in the Last Year: Never true    [place] in the Last Year: Never true  Transportation Needs: No Transportation Needs  (12/01/2017)   PRAPARE - Hydrologist (Medical): No    Lack of Transportation (Non-Medical): No  Physical Activity: Inactive (12/01/2017)   Exercise Vital Sign    Days of Exercise per Week: 0 days    Minutes of Exercise per Session: 0 min  Stress: No Stress Concern Present (05/23/2019)   [place]    Feeling of Stress : Not at all  Social Connections: [place] (12/01/2017)   Social Connection and Isolation Panel [NHANES]    Frequency of Communication with Friends and Family: More than three times a week    Frequency of Social Gatherings with Friends and Family: More than three times a week    Attends Religious Services: More than 4 times per year    Active Member of [employer] or Organizations: Yes    Attends Music therapist: More than 4 times per year    Marital Status: Married  Human resources officer Violence: Not At Risk (12/01/2017)   Humiliation, Afraid, Rape, and Kick questionnaire    Fear of Current or Ex-Partner: No    Emotionally Abused: No    Physically Abused: No    Sexually Abused: No     BP (!) 146/72   Pulse 75   Ht 5\' 7"  (1.702 m)   Wt 222 lb 12.8 oz (101.1 kg)   SpO2 94%   BMI 34.90 kg/m   Physical Exam:  Well appearing NAD HEENT: Unremarkable Neck:  No JVD, no  thyromegally Lymphatics:  No adenopathy Back:  No CVA tenderness Lungs:  Clear with no wheezes HEART:  Regular rate rhythm, no murmurs, no rubs, no clicks Abd:  soft, positive bowel sounds, no organomegally, no rebound, no guarding Ext:  2 plus pulses, no edema, no cyanosis, no clubbing Skin:  No rashes no nodules Neuro:  CN II through XII intact, motor grossly intact  EKG - atrial pacing with non-capture and ventricular pacing  DEVICE  Normal device function except for atrial undersensing.  See PaceArt for details.   Assess/Plan:  CHB - she is asymptomatic s/p PPM insertion. PPM -she has  some atrial undersensing and her device has been reprogrammed to an atrial sensitivity of 0.15.  HTN -her bp is up. We will work on better control in the coming weeks. Atrial tachy - I tried to pace her out and could not tell if her rhythm was sinus tachy or atrial tachy. I have asked her to wear a zio monitor.  Royston Sinner Geovanni Rahming,MD

## 2022-11-25 NOTE — Patient Instructions (Addendum)
Medication Instructions:  Your physician recommends that you continue on your current medications as directed. Please refer to the Current Medication list given to you today.  *If you need a refill on your cardiac medications before your next appointment, please call your pharmacy*  Lab Work: None ordered.  If you have labs (blood work) drawn today and your tests are completely normal, you will receive your results only by: [place] (if you have MyChart) OR A paper copy in the mail If you have any lab test that is abnormal or we need to change your treatment, we will call you to review the results.  Testing/Procedures: Dr. Cristopher Peru ordered a 3 Day Zio Heart Monitor.  We will contact you when the results / data is received.  Follow-Up: At [facility], you and your health needs are our priority.  As part of our continuing mission to provide you with exceptional heart care, we have created designated Provider Care Teams.  These Care Teams include your primary Cardiologist (physician) and Advanced Practice Providers (APPs -  Physician Assistants and Nurse Practitioners) who all work together to provide you with the care you need, when you need it.   Your next appointment:   Follow up after 3 Day Zio Heart Monitor results are reviewed by Dr. Lovena Le.   The format for your next appointment:   In Person  Provider:   Cristopher Peru, MD{or one of the following Advanced Practice Providers on your designated Care Team:   Tommye Standard, [place] Legrand Como "Jonni Sanger" Chalmers Cater, [place]  Remote monitoring is used to monitor your Pacemaker from home. This monitoring reduces the number of office visits required to check your device to one time per year. It allows [place] to keep an eye on the functioning of your device to ensure it is working properly. You are scheduled for a device check from home on 02/15/2023. You may send your transmission at any time that day. If you have a wireless device, the transmission  will be sent automatically. After your physician reviews your transmission, you will receive a postcard with your next transmission date.  ZIO XT- Long Term Monitor Instructions  Your physician has requested you wear a ZIO patch monitor for 14 days.  This is a single patch monitor. [employer] supplies one patch monitor per enrollment. Additional stickers are not available. Please do not apply patch if you will be having a Nuclear Stress Test,  Echocardiogram, Cardiac CT, MRI, or Chest Xray during the period you would be wearing the  monitor. The patch cannot be worn during these tests. You cannot remove and re-apply the  ZIO XT patch monitor.  Your ZIO patch monitor will be mailed 3 day USPS to your address on file. It may take 3-5 days  to receive your monitor after you have been enrolled.  Once you have received your monitor, please review the enclosed instructions. Your monitor  has already been registered assigning a specific monitor serial # to you.  Billing and Patient Assistance Program Information  We have supplied [employer] with any of your insurance information on file for billing purposes. [employer] offers a sliding scale Patient Assistance Program for patients that do not have  insurance, or whose insurance does not completely cover the cost of the ZIO monitor.  You must apply for the Patient Assistance Program to qualify for this discounted rate.  To apply, please call [employer] at [phone], select option 4, select option 2, ask to apply for  Patient Assistance Program.  [employer] will ask your household income, and how many people  are in your household. They will quote your out-of-pocket cost based on that information.  [employer] will also be able to set up a 10-month, interest-free payment plan if needed.  Applying the monitor   Shave hair from upper left chest.  Hold abrader disc by orange tab. Rub abrader in 40 strokes over the upper left chest as  indicated in your monitor  instructions.  Clean area with 4 enclosed alcohol pads. Let dry.  Apply patch as indicated in monitor instructions. Patch will be placed under collarbone on left  side of chest with arrow pointing upward.  Rub patch adhesive wings for 2 minutes. Remove white label marked "1". Remove the white  label marked "2". Rub patch adhesive wings for 2 additional minutes.  While looking in a mirror, press and release button in center of patch. A small green light will  flash 3-4 times. This will be your only indicator that the monitor has been turned on.  Do not shower for the first 24 hours. You may shower after the first 24 hours.  Press the button if you feel a symptom. You will hear a small click. Record Date, Time and  Symptom in the Patient Logbook.  When you are ready to remove the patch, follow instructions on the last 2 pages of Patient  Logbook. Stick patch monitor onto the last page of Patient Logbook.  Place Patient Logbook in the blue and white box. Use locking tab on box and tape box closed  securely. The blue and white box has prepaid postage on it. Please place it in the mailbox as  soon as possible. Your physician should have your test results approximately 7 days after the  monitor has been mailed back to [facility].  Call [place] at [phone] if you have questions regarding  your ZIO XT patch monitor. Call them immediately if you see an orange light blinking on your  monitor.  If your monitor falls off in less than 4 days, contact our Monitor department at [phone].  If your monitor becomes loose or falls off after 4 days call [employer] at [phone] for  suggestions on securing your monitor

## 2022-11-29 ENCOUNTER — Telehealth: Payer: Self-pay | Admitting: Internal Medicine

## 2022-11-29 NOTE — Telephone Encounter (Signed)
Pt called back per message received in [place] Triage.    After speaking with Pt, Pt already spoke to someone as to where to place zio monitor and her questions were answered.  However, Pt stated she had an allergy to adhesive tape, and wanted to know if the monitor would cause irritation? After consulting Ms. Shelly RN, there is a small chance that the monitor could cause skin irritation.   Pt advised she has three options: Apply monitor, follow directions, if skin irritation occurs, remove before 24 hours, call us to let us know, and mail back; She will not be charged.   Come into see Ms. Shelly RN, have her apply monitor with skin protectant at [facility] Speak with Dr. Lovena Le tomorrow about skin concern regarding heart monitor.   Pt chose to apply monitor at home, and watch her skin's reaction.  Pt appreciated knowing it can be removed prior to the 24 hour mark.  Pt will let us know if she cannot wear it;  Pt wanted to provide Dr. Lovena Le with the information he needed.  No follow up required at this time.

## 2022-11-29 NOTE — Telephone Encounter (Signed)
Patient's husband would like to discuss how patient should apply her event monitor with PPM.

## 2022-11-30 ENCOUNTER — Ambulatory Visit: Payer: Medicare Other | Admitting: Cardiovascular Disease

## 2022-12-04 ENCOUNTER — Encounter (HOSPITAL_BASED_OUTPATIENT_CLINIC_OR_DEPARTMENT_OTHER): Payer: Self-pay

## 2022-12-04 ENCOUNTER — Emergency Department (HOSPITAL_BASED_OUTPATIENT_CLINIC_OR_DEPARTMENT_OTHER): Payer: Medicare Other

## 2022-12-04 ENCOUNTER — Other Ambulatory Visit: Payer: Self-pay

## 2022-12-04 ENCOUNTER — Inpatient Hospital Stay (HOSPITAL_BASED_OUTPATIENT_CLINIC_OR_DEPARTMENT_OTHER)
Admission: EM | Admit: 2022-12-04 | Discharge: 2022-12-06 | DRG: 315 | Disposition: A | Discharge status: Home / Self Care | Payer: Medicare Other | Attending: Family Medicine | Admitting: Family Medicine

## 2022-12-04 DIAGNOSIS — I5032 Chronic diastolic (congestive) heart failure: Secondary | ICD-10-CM | POA: Diagnosis present

## 2022-12-04 DIAGNOSIS — I442 Atrioventricular block, complete: Secondary | ICD-10-CM | POA: Diagnosis present

## 2022-12-04 DIAGNOSIS — Z9884 Bariatric surgery status: Secondary | ICD-10-CM

## 2022-12-04 DIAGNOSIS — Z85038 Personal history of other malignant neoplasm of large intestine: Secondary | ICD-10-CM

## 2022-12-04 DIAGNOSIS — Z886 Allergy status to analgesic agent status: Secondary | ICD-10-CM

## 2022-12-04 DIAGNOSIS — N179 Acute kidney failure, unspecified: Secondary | ICD-10-CM | POA: Diagnosis present

## 2022-12-04 DIAGNOSIS — Z833 Family history of diabetes mellitus: Secondary | ICD-10-CM

## 2022-12-04 DIAGNOSIS — I5042 Chronic combined systolic (congestive) and diastolic (congestive) heart failure: Secondary | ICD-10-CM | POA: Diagnosis present

## 2022-12-04 DIAGNOSIS — I10 Essential (primary) hypertension: Secondary | ICD-10-CM | POA: Diagnosis present

## 2022-12-04 DIAGNOSIS — E1122 Type 2 diabetes mellitus with diabetic chronic kidney disease: Secondary | ICD-10-CM | POA: Diagnosis present

## 2022-12-04 DIAGNOSIS — E1165 Type 2 diabetes mellitus with hyperglycemia: Secondary | ICD-10-CM | POA: Diagnosis present

## 2022-12-04 DIAGNOSIS — Z9049 Acquired absence of other specified parts of digestive tract: Secondary | ICD-10-CM

## 2022-12-04 DIAGNOSIS — Z8249 Family history of ischemic heart disease and other diseases of the circulatory system: Secondary | ICD-10-CM

## 2022-12-04 DIAGNOSIS — Z87891 Personal history of nicotine dependence: Secondary | ICD-10-CM

## 2022-12-04 DIAGNOSIS — J449 Chronic obstructive pulmonary disease, unspecified: Secondary | ICD-10-CM | POA: Diagnosis present

## 2022-12-04 DIAGNOSIS — Z91040 Latex allergy status: Secondary | ICD-10-CM

## 2022-12-04 DIAGNOSIS — M545 Low back pain, unspecified: Secondary | ICD-10-CM | POA: Diagnosis present

## 2022-12-04 DIAGNOSIS — I130 Hypertensive heart and chronic kidney disease with heart failure and stage 1 through stage 4 chronic kidney disease, or unspecified chronic kidney disease: Secondary | ICD-10-CM | POA: Diagnosis present

## 2022-12-04 DIAGNOSIS — E1142 Type 2 diabetes mellitus with diabetic polyneuropathy: Secondary | ICD-10-CM | POA: Diagnosis present

## 2022-12-04 DIAGNOSIS — Z91048 Other nonmedicinal substance allergy status: Secondary | ICD-10-CM

## 2022-12-04 DIAGNOSIS — E785 Hyperlipidemia, unspecified: Secondary | ICD-10-CM | POA: Diagnosis present

## 2022-12-04 DIAGNOSIS — Z86718 Personal history of other venous thrombosis and embolism: Secondary | ICD-10-CM

## 2022-12-04 DIAGNOSIS — Z7901 Long term (current) use of anticoagulants: Secondary | ICD-10-CM

## 2022-12-04 DIAGNOSIS — E118 Type 2 diabetes mellitus with unspecified complications: Secondary | ICD-10-CM | POA: Diagnosis present

## 2022-12-04 DIAGNOSIS — R42 Dizziness and giddiness: Secondary | ICD-10-CM

## 2022-12-04 DIAGNOSIS — M1990 Unspecified osteoarthritis, unspecified site: Secondary | ICD-10-CM | POA: Diagnosis present

## 2022-12-04 DIAGNOSIS — H409 Unspecified glaucoma: Secondary | ICD-10-CM | POA: Diagnosis present

## 2022-12-04 DIAGNOSIS — Z888 Allergy status to other drugs, medicaments and biological substances status: Secondary | ICD-10-CM

## 2022-12-04 DIAGNOSIS — T82198A Other mechanical complication of other cardiac electronic device, initial encounter: Principal | ICD-10-CM | POA: Diagnosis present

## 2022-12-04 DIAGNOSIS — R55 Syncope and collapse: Secondary | ICD-10-CM

## 2022-12-04 DIAGNOSIS — M810 Age-related osteoporosis without current pathological fracture: Secondary | ICD-10-CM | POA: Diagnosis present

## 2022-12-04 DIAGNOSIS — G8929 Other chronic pain: Secondary | ICD-10-CM | POA: Diagnosis present

## 2022-12-04 DIAGNOSIS — I25118 Atherosclerotic heart disease of native coronary artery with other forms of angina pectoris: Secondary | ICD-10-CM | POA: Diagnosis present

## 2022-12-04 DIAGNOSIS — E538 Deficiency of other specified B group vitamins: Secondary | ICD-10-CM | POA: Diagnosis present

## 2022-12-04 DIAGNOSIS — N390 Urinary tract infection, site not specified: Secondary | ICD-10-CM | POA: Diagnosis present

## 2022-12-04 DIAGNOSIS — Z79899 Other long term (current) drug therapy: Secondary | ICD-10-CM

## 2022-12-04 DIAGNOSIS — I4719 Other supraventricular tachycardia: Secondary | ICD-10-CM | POA: Diagnosis present

## 2022-12-04 DIAGNOSIS — R531 Weakness: Secondary | ICD-10-CM

## 2022-12-04 DIAGNOSIS — I499 Cardiac arrhythmia, unspecified: Principal | ICD-10-CM | POA: Diagnosis present

## 2022-12-04 DIAGNOSIS — N183 Chronic kidney disease, stage 3 unspecified: Secondary | ICD-10-CM | POA: Diagnosis present

## 2022-12-04 DIAGNOSIS — I2510 Atherosclerotic heart disease of native coronary artery without angina pectoris: Secondary | ICD-10-CM | POA: Diagnosis present

## 2022-12-04 DIAGNOSIS — E1169 Type 2 diabetes mellitus with other specified complication: Secondary | ICD-10-CM | POA: Diagnosis present

## 2022-12-04 DIAGNOSIS — F32A Depression, unspecified: Secondary | ICD-10-CM | POA: Diagnosis present

## 2022-12-04 DIAGNOSIS — E039 Hypothyroidism, unspecified: Secondary | ICD-10-CM | POA: Diagnosis present

## 2022-12-04 DIAGNOSIS — Y718 Miscellaneous cardiovascular devices associated with adverse incidents, not elsewhere classified: Secondary | ICD-10-CM | POA: Diagnosis present

## 2022-12-04 DIAGNOSIS — Z7984 Long term (current) use of oral hypoglycemic drugs: Secondary | ICD-10-CM

## 2022-12-04 DIAGNOSIS — Z9221 Personal history of antineoplastic chemotherapy: Secondary | ICD-10-CM

## 2022-12-04 DIAGNOSIS — Z950 Presence of cardiac pacemaker: Secondary | ICD-10-CM

## 2022-12-04 DIAGNOSIS — Z794 Long term (current) use of insulin: Secondary | ICD-10-CM

## 2022-12-04 DIAGNOSIS — Z79890 Hormone replacement therapy (postmenopausal): Secondary | ICD-10-CM

## 2022-12-04 DIAGNOSIS — I480 Paroxysmal atrial fibrillation: Secondary | ICD-10-CM | POA: Diagnosis present

## 2022-12-04 LAB — CBC WITH DIFFERENTIAL/PLATELET
Abs Immature Granulocytes: 0.01 10*3/uL (ref 0.00–0.07)
Basophils Absolute: 0.1 10*3/uL (ref 0.0–0.1)
Basophils Relative: 1 %
Eosinophils Absolute: 0.2 10*3/uL (ref 0.0–0.5)
Eosinophils Relative: 2 %
HCT: 39.8 % (ref 36.0–46.0)
Hemoglobin: 12.7 g/dL (ref 12.0–15.0)
Immature Granulocytes: 0 %
Lymphocytes Relative: 35 %
Lymphs Abs: 2.9 10*3/uL (ref 0.7–4.0)
MCH: 28.5 pg (ref 26.0–34.0)
MCHC: 31.9 g/dL (ref 30.0–36.0)
MCV: 89.2 fL (ref 80.0–100.0)
Monocytes Absolute: 0.7 10*3/uL (ref 0.1–1.0)
Monocytes Relative: 8 %
Neutro Abs: 4.5 10*3/uL (ref 1.7–7.7)
Neutrophils Relative %: 54 %
Platelets: 332 10*3/uL (ref 150–400)
RBC: 4.46 MIL/uL (ref 3.87–5.11)
RDW: 13.2 % (ref 11.5–15.5)
WBC: 8.3 10*3/uL (ref 4.0–10.5)
nRBC: 0 % (ref 0.0–0.2)

## 2022-12-04 LAB — COMPREHENSIVE METABOLIC PANEL
ALT: 8 U/L (ref 0–44)
AST: 15 U/L (ref 15–41)
Albumin: 3.8 g/dL (ref 3.5–5.0)
Alkaline Phosphatase: 98 U/L (ref 38–126)
Anion gap: 9 (ref 5–15)
BUN: 27 mg/dL — ABNORMAL HIGH (ref 8–23)
CO2: 26 mmol/L (ref 22–32)
Calcium: 8.8 mg/dL — ABNORMAL LOW (ref 8.9–10.3)
Chloride: 99 mmol/L (ref 98–111)
Creatinine, Ser: 1.48 mg/dL — ABNORMAL HIGH (ref 0.44–1.00)
GFR, Estimated: 35 mL/min — ABNORMAL LOW (ref >60)
Glucose, Bld: 124 mg/dL — ABNORMAL HIGH (ref 70–99)
Potassium: 4.2 mmol/L (ref 3.5–5.1)
Sodium: 134 mmol/L — ABNORMAL LOW (ref 135–145)
Total Bilirubin: 0.6 mg/dL (ref 0.3–1.2)
Total Protein: 7.6 g/dL (ref 6.5–8.1)

## 2022-12-04 LAB — CBG MONITORING, ED: Glucose-Capillary: 133 mg/dL — ABNORMAL HIGH (ref 70–99)

## 2022-12-04 LAB — MAGNESIUM: Magnesium: 1.8 mg/dL (ref 1.7–2.4)

## 2022-12-04 LAB — TROPONIN I (HIGH SENSITIVITY): Troponin I (High Sensitivity): 12 ng/L (ref <18)

## 2022-12-04 NOTE — ED Notes (Addendum)
St Jude pacemaker interrogated successful.

## 2022-12-04 NOTE — ED Notes (Signed)
Magnet placed on pacemaker by Rancour MD

## 2022-12-04 NOTE — ED Notes (Signed)
Unable to provide urine specimen. Says she is on fluid restrictions and does not produce much urine

## 2022-12-04 NOTE — ED Provider Notes (Signed)
[place] [facility] Provider Note   CSN: [id number] Arrival date & time: 12/04/22  2225     History {Add pertinent medical, surgical, social history, OB history to HPI:1} Chief Complaint  Patient presents with   Dizziness    Mary [name] is a 82 y.o. female.  Patient with a history of CAD, hypertension, atrial fibrillation on Eliquis, complete heart block with pacemaker, diabetes presenting with dizziness for the past 2 days.  She describes a feeling of unsteady on her feet and lightheadedness with difficulty walking.  She has been too unsteady to walk and feels like she is going to pass out and fall over.  She notably had an adjustment to her pacemaker about a week ago by [employer].  She arrives in a paced tachycardia at about 130.  She denies any chest pain or shortness of breath but does feel generally weak.  States she feels unwell and feels like she is going to pass out.  Denies any fevers, chills, nausea or vomiting.  No focal weakness, difficulty speaking or difficulty swallowing.  No pain with urination or blood in the urine.  No fall.  No significant headache or visual changes.  Still feels like she is unsteady and was unable to walk from the car to the ED waiting room.  States she normally walks with a cane.  The history is provided by the patient.  Dizziness Associated symptoms: weakness   Associated symptoms: no chest pain, no nausea, no shortness of breath and no vomiting        Home Medications Prior to Admission medications   Medication Sig Start Date End Date Taking? Authorizing Provider  albuterol (VENTOLIN HFA) 108 (90 Base) MCG/ACT inhaler Inhale 1-2 puffs into the lungs every 6 (six) hours as needed for wheezing or shortness of breath. 10/22/21   Eustace Moore, MD  atorvastatin (LIPITOR) 80 MG tablet TAKE 1 TABLET(80 MG) BY MOUTH DAILY 09/17/19   Myrlene Broker, MD  baclofen (LIORESAL) 10 MG tablet Take 1 tablet (10 mg  total) by mouth daily as needed for muscle spasms. 02/25/22   Myrlene Broker, MD  buPROPion (WELLBUTRIN XL) 150 MG 24 hr tablet TAKE 1 TABLET BY MOUTH EVERY DAY 03/23/21   Myrlene Broker, MD  cyanocobalamin (VITAMIN B12) 1000 MCG/ML injection INJECT INTO THE MUSCLE EVERY 30 DAYS 09/08/22   Myrlene Broker, MD  ELIQUIS 5 MG TABS tablet TAKE ONE TABLET BY MOUTH TWICE A DAY 12/18/21   Lyn Records, MD  empagliflozin (JARDIANCE) 10 MG TABS tablet Take 1 tablet (10 mg total) by mouth daily before breakfast. 04/05/22   Lyn Records, MD  HYDROcodone-acetaminophen (NORCO/VICODIN) 5-325 MG tablet Take 1 tablet by mouth every 6 (six) hours as needed. 09/27/22   Myrlene Broker, MD  insulin glargine (LANTUS) 100 UNIT/ML injection Inject into the skin daily. Sliding Scale    [provider]  insulin lispro (HUMALOG) 100 UNIT/ML injection Inject 20 Units into the skin as needed for high blood sugar. If blood sugar over 200.    [provider]  levothyroxine (SYNTHROID) 137 MCG tablet Take 1 tablet (137 mcg total) by mouth daily. 02/18/22   Myrlene Broker, MD  metoprolol succinate (TOPROL-XL) 25 MG 24 hr tablet TAKE 1/2 TABLETS AT BEDTIME 10/12/22   Croitoru, Mihai, MD  nitroGLYCERIN (NITROSTAT) 0.4 MG SL tablet Place 1 tablet (0.4 mg total) under the tongue every 5 (five) minutes as needed  for chest pain. 04/09/19   Lyn Records, MD  Potassium Chloride ER 20 MEQ TBCR Take 2 tablets (40 mEq total) by mouth 2 (two) times daily. 10/12/22   Croitoru, Mihai, MD  spironolactone (ALDACTONE) 25 MG tablet Take 0.5 tablets (12.5 mg total) by mouth daily. 10/12/22   Croitoru, Mihai, MD  torsemide (DEMADEX) 20 MG tablet TAKE ONE TABLET BY MOUTH TWICE A DAY 09/16/22   Marinus Maw, MD  Vitamin D, Ergocalciferol, (DRISDOL) 1.25 MG (50000 UNIT) CAPS capsule TAKE 1 CAPSULE BY MOUTH EVERY 7 DAYS Patient taking differently: Take 50,000 Units by mouth every Wednesday. 04/12/22    Myrlene Broker, MD      Allergies    Other, Adhesive [tape], Aspirin, Latex, and Nsaids    Review of Systems   Review of Systems  Constitutional:  Positive for fatigue. Negative for activity change and appetite change.  HENT:  Negative for congestion and rhinorrhea.   Respiratory:  Negative for cough, chest tightness and shortness of breath.   Cardiovascular:  Negative for chest pain.  Gastrointestinal:  Negative for abdominal pain, nausea and vomiting.  Genitourinary:  Negative for dysuria and hematuria.  Musculoskeletal:  Negative for arthralgias and myalgias.  Skin:  Negative for rash.  Neurological:  Positive for dizziness, weakness and light-headedness.   all other systems are negative except as noted in the HPI and PMH.    Physical Exam Updated Vital Signs BP 122/83   Pulse (!) 125   Temp 98.2 F (36.8 C)   Resp (!) 23   Ht 5\' 7"  (1.702 m)   Wt 99.8 kg   SpO2 94%   BMI 34.46 kg/m  Physical Exam Vitals and nursing note reviewed.  Constitutional:      General: She is not in acute distress.    Appearance: She is well-developed.  HENT:     Head: Normocephalic and atraumatic.     Mouth/Throat:     Pharynx: No oropharyngeal exudate.  Eyes:     Conjunctiva/sclera: Conjunctivae normal.     Pupils: Pupils are equal, round, and reactive to light.  Neck:     Comments: No meningismus. Cardiovascular:     Rate and Rhythm: Regular rhythm. Tachycardia present.     Heart sounds: Normal heart sounds. No murmur heard. Pulmonary:     Effort: Pulmonary effort is normal. No respiratory distress.     Breath sounds: Normal breath sounds.  Abdominal:     Palpations: Abdomen is soft.     Tenderness: There is no abdominal tenderness. There is no guarding or rebound.  Musculoskeletal:        General: No tenderness. Normal range of motion.     Cervical back: Normal range of motion and neck supple.  Skin:    General: Skin is warm.  Neurological:     Mental Status: She is  alert and oriented to person, place, and time.     Cranial Nerves: No cranial nerve deficit.     Motor: No abnormal muscle tone.     Coordination: Coordination normal.     Comments: CN 2-12 intact, no ataxia on finger to nose, no nystagmus, 5/5 strength throughout, no pronator drift,  No nystagmus, no ataxia on finger-to-nose unable to stand due to dizziness.  Psychiatric:        Behavior: Behavior normal.     ED Results / Procedures / Treatments   Labs (all labs ordered are listed, but only abnormal results are displayed) Labs Reviewed  COMPREHENSIVE  METABOLIC PANEL - Abnormal; Notable for the following components:      Result Value   Sodium 134 (*)    Glucose, Bld 124 (*)    BUN 27 (*)    Creatinine, Ser 1.48 (*)    Calcium 8.8 (*)    GFR, Estimated 35 (*)    All other components within normal limits  CBG MONITORING, ED - Abnormal; Notable for the following components:   Glucose-Capillary 133 (*)    All other components within normal limits  CBC WITH DIFFERENTIAL/PLATELET  MAGNESIUM  URINALYSIS, ROUTINE W REFLEX MICROSCOPIC  TROPONIN I (HIGH SENSITIVITY)    EKG EKG Interpretation  Date/Time:  Saturday December 04 2022 22:38:07 EDT Ventricular Rate:  129 PR Interval:    QRS Duration: 183 QT Interval:  395 QTC Calculation: 579 R Axis:   -78 Text Interpretation: rJunctional tachycardia Nonspecific IVCD with LAD LVH with secondary repolarization abnormalit Rate faster Confirmed by Glynn Octaveancour, Lenyx Boody [id number]) on 12/04/2022 11:04:25 PM  Radiology DG Chest Portable 1 View  Result Date: 12/04/2022 CLINICAL DATA:  Dizziness and tachycardia EXAM: PORTABLE CHEST 1 VIEW COMPARISON:  Radiographs 02/14/2019 FINDINGS: Stable cardiomegaly. Left chest wall pacemaker. Aortic atherosclerotic calcification. No focal consolidation, pleural effusion, or pneumothorax. No acute osseous abnormality. IMPRESSION: No active disease. Electronically Signed   By: Minerva Festeryler  Stutzman M.D.   On: 12/04/2022 23:00     Procedures Procedures  {Document cardiac monitor, telemetry assessment procedure when appropriate:1}  Medications Ordered in ED Medications - No data to display  ED Course/ Medical Decision Making/ A&P   {   Click here for ABCD2, HEART and other calculatorsREFRESH Note before signing :1}                          Medical Decision Making Amount and/or Complexity of Data Reviewed Labs: ordered. Decision-making details documented in ED Course. Radiology: ordered and independent interpretation performed. Decision-making details documented in ED Course. ECG/medicine tests: ordered and independent interpretation performed. Decision-making details documented in ED Course.   Lightheadedness, dizziness and generalized fatigue for the past 2 days.  No focal neurological deficits.  No nystagmus or ataxia finger-to-nose.  Unable to stand due to dizziness however.  On arrival she has a functional pace tachycardia about 130.  Magnet placed on pacemaker with change in rate to 100.  Discussed with cardiology {Document critical care time when appropriate:1} {Document review of labs and clinical decision tools ie heart score, Chads2Vasc2 etc:1}  {Document your independent review of radiology images, and any outside records:1} {Document your discussion with family members, caretakers, and with consultants:1} {Document social determinants of health affecting pt's care:1} {Document your decision making why or why not admission, treatments were needed:1} Final Clinical Impression(s) / ED Diagnoses Final diagnoses:  None    Rx / DC Orders ED Discharge Orders     None

## 2022-12-04 NOTE — ED Notes (Signed)
Unable to complete orthostatics. Dizzy while siting.

## 2022-12-04 NOTE — ED Triage Notes (Addendum)
Arrives POV with spouse with dizziness x 2 days. Says initially "she just feels unwell".   Recently seen by cardio and was wearing a zio monitor. Has since sent it back but not received any results. Recently had adjustments made to pacemaker last week.    Says she has a [employer] fund[employer] and hx of afib compliant on all her meds.

## 2022-12-05 DIAGNOSIS — J449 Chronic obstructive pulmonary disease, unspecified: Secondary | ICD-10-CM | POA: Diagnosis present

## 2022-12-05 DIAGNOSIS — I5042 Chronic combined systolic (congestive) and diastolic (congestive) heart failure: Secondary | ICD-10-CM | POA: Diagnosis present

## 2022-12-05 DIAGNOSIS — E1122 Type 2 diabetes mellitus with diabetic chronic kidney disease: Secondary | ICD-10-CM | POA: Diagnosis present

## 2022-12-05 DIAGNOSIS — E039 Hypothyroidism, unspecified: Secondary | ICD-10-CM | POA: Diagnosis present

## 2022-12-05 DIAGNOSIS — I4719 Other supraventricular tachycardia: Secondary | ICD-10-CM | POA: Diagnosis present

## 2022-12-05 DIAGNOSIS — F32A Depression, unspecified: Secondary | ICD-10-CM | POA: Diagnosis present

## 2022-12-05 DIAGNOSIS — N390 Urinary tract infection, site not specified: Secondary | ICD-10-CM | POA: Diagnosis present

## 2022-12-05 DIAGNOSIS — I499 Cardiac arrhythmia, unspecified: Secondary | ICD-10-CM | POA: Diagnosis present

## 2022-12-05 DIAGNOSIS — E1142 Type 2 diabetes mellitus with diabetic polyneuropathy: Secondary | ICD-10-CM | POA: Diagnosis present

## 2022-12-05 DIAGNOSIS — I480 Paroxysmal atrial fibrillation: Secondary | ICD-10-CM | POA: Diagnosis present

## 2022-12-05 DIAGNOSIS — R55 Syncope and collapse: Secondary | ICD-10-CM | POA: Diagnosis present

## 2022-12-05 DIAGNOSIS — M1990 Unspecified osteoarthritis, unspecified site: Secondary | ICD-10-CM | POA: Diagnosis present

## 2022-12-05 DIAGNOSIS — Y718 Miscellaneous cardiovascular devices associated with adverse incidents, not elsewhere classified: Secondary | ICD-10-CM | POA: Diagnosis present

## 2022-12-05 DIAGNOSIS — N179 Acute kidney failure, unspecified: Secondary | ICD-10-CM | POA: Diagnosis present

## 2022-12-05 DIAGNOSIS — I130 Hypertensive heart and chronic kidney disease with heart failure and stage 1 through stage 4 chronic kidney disease, or unspecified chronic kidney disease: Secondary | ICD-10-CM | POA: Diagnosis present

## 2022-12-05 DIAGNOSIS — I25118 Atherosclerotic heart disease of native coronary artery with other forms of angina pectoris: Secondary | ICD-10-CM | POA: Diagnosis present

## 2022-12-05 DIAGNOSIS — R531 Weakness: Secondary | ICD-10-CM

## 2022-12-05 DIAGNOSIS — R42 Dizziness and giddiness: Secondary | ICD-10-CM | POA: Diagnosis present

## 2022-12-05 DIAGNOSIS — Z794 Long term (current) use of insulin: Secondary | ICD-10-CM | POA: Diagnosis not present

## 2022-12-05 DIAGNOSIS — G8929 Other chronic pain: Secondary | ICD-10-CM | POA: Diagnosis present

## 2022-12-05 DIAGNOSIS — H409 Unspecified glaucoma: Secondary | ICD-10-CM | POA: Diagnosis present

## 2022-12-05 DIAGNOSIS — M810 Age-related osteoporosis without current pathological fracture: Secondary | ICD-10-CM | POA: Diagnosis present

## 2022-12-05 DIAGNOSIS — T82198A Other mechanical complication of other cardiac electronic device, initial encounter: Secondary | ICD-10-CM | POA: Diagnosis present

## 2022-12-05 DIAGNOSIS — E1165 Type 2 diabetes mellitus with hyperglycemia: Secondary | ICD-10-CM | POA: Diagnosis present

## 2022-12-05 DIAGNOSIS — N183 Chronic kidney disease, stage 3 unspecified: Secondary | ICD-10-CM | POA: Diagnosis present

## 2022-12-05 DIAGNOSIS — M545 Low back pain, unspecified: Secondary | ICD-10-CM | POA: Diagnosis present

## 2022-12-05 DIAGNOSIS — I442 Atrioventricular block, complete: Secondary | ICD-10-CM | POA: Diagnosis present

## 2022-12-05 DIAGNOSIS — E1169 Type 2 diabetes mellitus with other specified complication: Secondary | ICD-10-CM | POA: Diagnosis present

## 2022-12-05 LAB — URINALYSIS, ROUTINE W REFLEX MICROSCOPIC
Bilirubin Urine: NEGATIVE
Glucose, UA: NEGATIVE mg/dL
Ketones, ur: NEGATIVE mg/dL
Nitrite: POSITIVE — AB
Protein, ur: NEGATIVE mg/dL
Specific Gravity, Urine: 1.015 (ref 1.005–1.030)
pH: 5.5 (ref 5.0–8.0)

## 2022-12-05 LAB — URINALYSIS, MICROSCOPIC (REFLEX)

## 2022-12-05 LAB — TROPONIN I (HIGH SENSITIVITY): Troponin I (High Sensitivity): 10 ng/L (ref <18)

## 2022-12-05 LAB — CBG MONITORING, ED: Glucose-Capillary: 201 mg/dL — ABNORMAL HIGH (ref 70–99)

## 2022-12-05 LAB — GLUCOSE, CAPILLARY
Glucose-Capillary: 216 mg/dL — ABNORMAL HIGH (ref 70–99)
Glucose-Capillary: 216 mg/dL — ABNORMAL HIGH (ref 70–99)
Glucose-Capillary: 138 mg/dL — ABNORMAL HIGH (ref 70–99)

## 2022-12-05 LAB — BRAIN NATRIURETIC PEPTIDE: B Natriuretic Peptide: 134.5 pg/mL — ABNORMAL HIGH (ref 0.0–100.0)

## 2022-12-05 LAB — TSH: TSH: 32.524 u[IU]/mL — ABNORMAL HIGH (ref 0.350–4.500)

## 2022-12-05 LAB — T4, FREE: Free T4: 0.58 ng/dL — ABNORMAL LOW (ref 0.61–1.12)

## 2022-12-05 LAB — MRSA NEXT GEN BY PCR, NASAL: MRSA by PCR Next Gen: NOT DETECTED

## 2022-12-05 MED ORDER — POTASSIUM CHLORIDE CRYS ER 20 MEQ PO TBCR
40.0000 meq | EXTENDED_RELEASE_TABLET | Freq: Every day | ORAL | Status: DC
  Administered 2022-12-05 – 2022-12-06 (×2): 40 meq via ORAL
  Filled 2022-12-05 (×2): qty 2

## 2022-12-05 MED ORDER — POTASSIUM CHLORIDE ER 20 MEQ PO TBCR: 2.0000 | EXTENDED_RELEASE_TABLET | Freq: Two times a day (BID) | ORAL | Status: DC

## 2022-12-05 MED ORDER — ONDANSETRON HCL 4 MG/2ML IJ SOLN: 4.0000 mg | Freq: Four times a day (QID) | INTRAMUSCULAR | Status: DC | PRN

## 2022-12-05 MED ORDER — SODIUM CHLORIDE 0.9 % IV BOLUS: 1000.0000 mL | Freq: Once | INTRAVENOUS | Status: DC

## 2022-12-05 MED ORDER — METOPROLOL TARTRATE 5 MG/5ML IV SOLN
2.5000 mg | Freq: Once | INTRAVENOUS | Status: AC
  Administered 2022-12-05: 2.5 mg via INTRAVENOUS
  Filled 2022-12-05: qty 5

## 2022-12-05 MED ORDER — BUPROPION HCL ER (XL) 150 MG PO TB24
150.0000 mg | ORAL_TABLET | Freq: Every day | ORAL | Status: DC
  Administered 2022-12-05 – 2022-12-06 (×2): 150 mg via ORAL
  Filled 2022-12-05 (×2): qty 1

## 2022-12-05 MED ORDER — ACETAMINOPHEN 650 MG RE SUPP: 650.0000 mg | Freq: Four times a day (QID) | RECTAL | Status: DC | PRN

## 2022-12-05 MED ORDER — SODIUM CHLORIDE 0.9 % IV SOLN: 250.0000 mL | INTRAVENOUS | Status: DC | PRN

## 2022-12-05 MED ORDER — ATORVASTATIN CALCIUM 80 MG PO TABS
80.0000 mg | ORAL_TABLET | Freq: Every day | ORAL | Status: DC
  Administered 2022-12-05 – 2022-12-06 (×2): 80 mg via ORAL
  Filled 2022-12-05 (×2): qty 1

## 2022-12-05 MED ORDER — HYDROCODONE-ACETAMINOPHEN 5-325 MG PO TABS: 1.0000 | ORAL_TABLET | Freq: Four times a day (QID) | ORAL | Status: DC | PRN

## 2022-12-05 MED ORDER — METOPROLOL TARTRATE 25 MG PO TABS
25.0000 mg | ORAL_TABLET | Freq: Two times a day (BID) | ORAL | Status: DC
  Administered 2022-12-05 – 2022-12-06 (×3): 25 mg via ORAL
  Filled 2022-12-05 (×3): qty 1

## 2022-12-05 MED ORDER — EMPAGLIFLOZIN 10 MG PO TABS
10.0000 mg | ORAL_TABLET | Freq: Every day | ORAL | Status: DC
  Administered 2022-12-06: 10 mg via ORAL
  Filled 2022-12-05: qty 1

## 2022-12-05 MED ORDER — INSULIN ASPART 100 UNIT/ML IJ SOLN
0.0000 [IU] | Freq: Three times a day (TID) | INTRAMUSCULAR | Status: DC
  Administered 2022-12-05 (×2): 3 [IU] via SUBCUTANEOUS
  Administered 2022-12-06: 2 [IU] via SUBCUTANEOUS
  Administered 2022-12-06: 1 [IU] via SUBCUTANEOUS

## 2022-12-05 MED ORDER — APIXABAN 5 MG PO TABS
5.0000 mg | ORAL_TABLET | Freq: Two times a day (BID) | ORAL | Status: DC
  Administered 2022-12-05 – 2022-12-06 (×3): 5 mg via ORAL
  Filled 2022-12-05 (×3): qty 1

## 2022-12-05 MED ORDER — SODIUM CHLORIDE 0.9% FLUSH
3.0000 mL | Freq: Two times a day (BID) | INTRAVENOUS | Status: DC
  Administered 2022-12-05 – 2022-12-06 (×3): 3 mL via INTRAVENOUS

## 2022-12-05 MED ORDER — SODIUM CHLORIDE 0.9% FLUSH: 3.0000 mL | INTRAVENOUS | Status: DC | PRN

## 2022-12-05 MED ORDER — LEVOTHYROXINE SODIUM 75 MCG PO TABS
150.0000 ug | ORAL_TABLET | Freq: Every day | ORAL | Status: DC
  Administered 2022-12-06: 150 ug via ORAL
  Filled 2022-12-05: qty 2

## 2022-12-05 MED ORDER — SODIUM CHLORIDE 0.9 % IV BOLUS
500.0000 mL | Freq: Once | INTRAVENOUS | Status: AC
  Administered 2022-12-05: 500 mL via INTRAVENOUS

## 2022-12-05 MED ORDER — FUROSEMIDE 10 MG/ML IJ SOLN
40.0000 mg | Freq: Once | INTRAMUSCULAR | Status: AC
  Administered 2022-12-05: 40 mg via INTRAVENOUS
  Filled 2022-12-05: qty 4

## 2022-12-05 MED ORDER — POTASSIUM CHLORIDE CRYS ER 20 MEQ PO TBCR: 40.0000 meq | EXTENDED_RELEASE_TABLET | Freq: Two times a day (BID) | ORAL | Status: DC

## 2022-12-05 MED ORDER — SODIUM CHLORIDE 0.9 % IV SOLN
1.0000 g | Freq: Once | INTRAVENOUS | Status: AC
  Administered 2022-12-05: 1 g via INTRAVENOUS
  Filled 2022-12-05: qty 10

## 2022-12-05 MED ORDER — INSULIN GLARGINE-YFGN 100 UNIT/ML ~~LOC~~ SOLN
15.0000 [IU] | Freq: Every day | SUBCUTANEOUS | Status: DC
  Administered 2022-12-05 – 2022-12-06 (×2): 15 [IU] via SUBCUTANEOUS
  Filled 2022-12-05 (×2): qty 0.15

## 2022-12-05 MED ORDER — ONDANSETRON HCL 4 MG PO TABS: 4.0000 mg | ORAL_TABLET | Freq: Four times a day (QID) | ORAL | Status: DC | PRN

## 2022-12-05 MED ORDER — INSULIN ASPART 100 UNIT/ML IJ SOLN: 0.0000 [IU] | Freq: Three times a day (TID) | INTRAMUSCULAR | Status: DC

## 2022-12-05 MED ORDER — ACETAMINOPHEN 325 MG PO TABS: 650.0000 mg | ORAL_TABLET | Freq: Four times a day (QID) | ORAL | Status: DC | PRN

## 2022-12-05 MED ORDER — SPIRONOLACTONE 12.5 MG HALF TABLET
12.5000 mg | ORAL_TABLET | Freq: Every day | ORAL | Status: DC
  Administered 2022-12-05 – 2022-12-06 (×2): 12.5 mg via ORAL
  Filled 2022-12-05 (×2): qty 1

## 2022-12-05 NOTE — Assessment & Plan Note (Signed)
No signs/sx of exacerbation On SABA prn

## 2022-12-05 NOTE — Assessment & Plan Note (Signed)
Controlled, metoprolol increased to 25mg  BID, on toprolol XL at home 12.5mg  QHS Continue spironolactone Monitor and adjust as needed

## 2022-12-05 NOTE — Assessment & Plan Note (Signed)
S/p PPM Continue eliquis and metoprolol

## 2022-12-05 NOTE — Assessment & Plan Note (Signed)
82 year old female with history of CHB s/p PPM placement presenting with 2 day history of extreme weakness and fatigue and dizziness found to have PPM induced tachycardia -obs to telemetry -cards consulted, EP seen and following -CT head negative, dizziness resolved -metoprolol increased. On toprol XL 12.5mg  daily, EP changed to metoprolol 25mg  Bid  -EP programmed her device DDI 85 bpm for now -continue telemetry  -OT/PT consult

## 2022-12-05 NOTE — Assessment & Plan Note (Signed)
Continue high intensity statin 

## 2022-12-05 NOTE — Plan of Care (Signed)

## 2022-12-05 NOTE — Assessment & Plan Note (Addendum)
Likely multifactorial with cardiac, hypothyroidism not controlled and possible UTI -she Is completely asymptomatic from a UTI perspective. Hold rocephin, f/u on culture  -cardiology to see for PPM induced tachycardia -thyroid labs pending (TSH >20 in 6/23)  -B12 deficiency, was doing shots, but slightly decreased  -PT/OT eval  -fall precautions

## 2022-12-05 NOTE — Assessment & Plan Note (Signed)
Continue wellbutrin. 

## 2022-12-05 NOTE — Assessment & Plan Note (Signed)
Baseline creatinine 1.3-1.4 ?Stable, continue to monitor  ?

## 2022-12-05 NOTE — Assessment & Plan Note (Signed)
No chest pain Continue medical management

## 2022-12-05 NOTE — Assessment & Plan Note (Addendum)
States she has gained quite a bit of weight with increased LE swelling Could be secondary to PPM induced tachycardia  BNP pending  Echo in 2019: EF of 40-45% with diffuse hypokinesis  Update echo  Lasix x1 IV monitor output and adjust  Strict I/o and monitor weight  She was 204 pounds at end of January 2024>212# 2/29 and 3/28>222.  Could also be secondary to thyroid disease

## 2022-12-05 NOTE — Assessment & Plan Note (Addendum)
TSH 20 back in 6/23 with no f/u labs Check TSH/free T4, could be contributing to her weakness  TSH >30, increase synthroid to . Need to make sure taking on empty stomach every morning

## 2022-12-05 NOTE — ED Notes (Signed)
Report to [facility] and 2 C room 4 , Nurse Lajoyce Corners

## 2022-12-05 NOTE — Progress Notes (Signed)
This is a 82 year old female with past medical history of CHB, s/p PPM insertion, parosymal atrial fib and heart block s/p PPM insertion, hypothyroidism, DM 2, hyperlipidemia, COPD, CKD stage III, MDD, HFrEF, and history of colon cancer.  Patient presents with complaints of dizziness, palpitations, and weakness for the past 2 days.  These have been to the point she has had difficulty ambulating.  She was presyncopal when ambulating.  Patient presented to [facility].  In the ER patient appears to have pacemaker mediated tachycardia with heart rates into the 130s.  This improved since the 90s when magnet is placed, quickly rebounded back to be removed.  Patient's dizziness does not improve when magnesium is in place.  Patient UA appears infected.  Patient with mild AKI with creatinine 1.48, baseline 1.21 - last done 09/27/2022.  CT head is negative.  Patient has received IV Rocephin  Cardiologist Dr. Ardyth Harps consulted.  Recommended admission.  Cardiology will consult to see patient in AM.

## 2022-12-05 NOTE — H&P (Addendum)
History and Physical    Patient: Mary [name]:[id number] DOB: Aug 15, 1941 DOA: 12/04/2022 DOS: the patient was seen and examined on 12/05/2022 PCP: Laroy Apple, PA-C  Patient coming from:  [facility]  - lives with her husband. Uses walker and cane at times.    Chief Complaint: dizziness/weakness   HPI: [name] is a 82 y.o. female with medical history significant of   CHB, s/p PPM insertion, parosymal atrial fib and heart block s/p PPM insertion, hypothyroidism, DM 2, hyperlipidemia, COPD, CKD stage III, MDD, HFrEF, and history of colon cancer who presented to ED after 2 day history of dizziness, palpitations and weakness. She was having difficulty ambulating. On Friday she felt like she was dog tired and like she had worked hard all day.  Her husband wanted her to go to ED then, but she went to bed and slept most of evening/night.  She got up really early on Saturday and she could hardly get going. She also started to become dizzy that was quite horrible. She had no N/V, but states her head hurt really bad. Her dizziness is better now. She has no chest pain or palpitations. She does have shortness of breath. She took her blood pressure and it was good, but her heart rate was high (115) and she decided to go to ED.   She also reports increased leg swelling. She has been following her fluid restricted diet and does low salt. She has been eating bacon. She also has gained weight.   She tells me in December her PPM stopped working. She had a new PPM placed in December by Dr. Ladona Ridgel. She saw him 2 weeks for PPM follow up and it wasn't working properly. At this time she had some atrial undersensing and her device was reprogrammed to an atrial sensitivity of 0.15.  She had atrial tachycardia and a zio patch was ordered.  She put this on Monday April 1st and wore for 3 days.   Denies any fever/chills, chest pain or palpitations, cough, abdominal pain, N/V/D, dysuria or leg swelling.   She does not  smoke or drink alcohol.   ER Course:  vitals: afebrile, bp: 132/86, HR: 128, RR: 21, oxygen: 95% on RA Pertinent labs: BUN: 27, creatinine: 1.48,  CT head: no acute finding CXR: no active disease In ED: cardiology consulted, given rocephin for UTI, 500cc IVF bolus and 2.5mg  lopressor. Magnet placed on PPM with improvement in rate to 100, but recurs when magnet removed. PPM interrogation remarkable for multiple episodes of PPM mediates tachycardia as well as AMS switching. [facility] asked to admit.    Review of Systems: As mentioned in the history of present illness. All other systems reviewed and are negative. Past Medical History:  Diagnosis Date   Arthritis    Atrial tachycardia 04/23/2017   CAD (coronary artery disease)    non obstructive   Cardiac pacemaker in situ - [employer] Jude 05/13/2014   Permanent pacemaker for second-degree heart block. Procedure complicated by an atrial lead dislodgment and repeat procedure, 12/2013    Carotid stenosis    Carotid US 5/16:  Bilateral ICA 1-39%; L vertebral retrograde; L BP 126/49, R BP 140/57   Chronic combined systolic and diastolic CHF (congestive heart failure)    Chronic lower back pain    Colon cancer    a. s/p chemo   Colon polyps    Diabetic peripheral neuropathy    DVT (deep venous thrombosis) 2013   "twice behind knee on  left side" (06/25/2013)   Glaucoma    History of chicken pox    Hyperlipidemia    Hypertension    Hypothyroidism    Iron deficiency anemia    Multinodular goiter    Osteoporosis    PAF (paroxysmal atrial fibrillation)    Sleep apnea    a. resolved post weight loss    Type II diabetes mellitus    Past Surgical History:  Procedure Laterality Date   CARDIAC CATHETERIZATION  2006   Never had PCI, 3 caths total. Last one in [place]   CARDIAC CATHETERIZATION N/A 02/04/2015   Procedure: Left Heart Cath and Coronary Angiography;  Surgeon: Lyn Records, MD;  Location: [facility] INVASIVE CV LAB;  Service: Cardiovascular;   Laterality: N/A;   CATARACT EXTRACTION, BILATERAL Bilateral 2013   "and put stent in my left eye for glaucoma" (06/25/2013)   CHOLECYSTECTOMY  1980's   COLECTOMY  10/2010   Tumor removal   EYE MUSCLE SURGERY Bilateral ~ 1963   "muscles too long; eyes would go out and up; tied muscles to hold my eyes straight" (06/25/2013)   LEAD REVISION  01-03-2014   atrial lead revision by Dr Johney Frame   LEAD REVISION N/A 01/03/2014   Procedure: LEAD REVISION;  Surgeon: Gardiner Rhyme, MD;  Location: [facility] CATH LAB;  Service: Cardiovascular;  Laterality: N/A;   LEFT HEART CATH AND CORONARY ANGIOGRAPHY N/A 05/29/2018   Procedure: LEFT HEART CATH AND CORONARY ANGIOGRAPHY;  Surgeon: Lyn Records, MD;  Location: [facility] INVASIVE CV LAB;  Service: Cardiovascular;  Laterality: N/A;   PACEMAKER INSERTION  01/02/2014   STJ Assurity dual chamber pacemaker implnated by Dr Ladona Ridgel for SSS   PERMANENT PACEMAKER INSERTION N/A 01/02/2014   Procedure: PERMANENT PACEMAKER INSERTION;  Surgeon: Marinus Maw, MD;  Location: [facility] CATH LAB;  Service: Cardiovascular;  Laterality: N/A;   PORTACATH PLACEMENT Left 11/2010   PPM GENERATOR CHANGEOUT N/A 08/16/2022   Procedure: PPM GENERATOR CHANGEOUT;  Surgeon: Marinus Maw, MD;  Location: [facility] INVASIVE CV LAB;  Service: Cardiovascular;  Laterality: N/A;   ROUX-EN-Y GASTRIC BYPASS  2009   ULNAR TUNNEL RELEASE Left ~ 2008   UMBILICAL HERNIA REPAIR  1970's?    (06/25/2013)   Social History:  reports that she quit smoking about 5 years ago. Her smoking use included cigarettes. She has a 30.00 pack-year smoking history. She has never used smokeless tobacco. She reports that she does not currently use alcohol. She reports that she does not use drugs.  Allergies  Allergen Reactions   Other Other (See Comments)    NO Blind Scopes with Naso Gastric tube.  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 2009 Also, a (name not recalled) chemo med caused PERMANENT NEUROPATHY and affected sense of taste   Adhesive [Tape]      Tape - burns - pls use paper tape or elastic bandage   Aspirin Other (See Comments)    S/P gastric bypass surgery, states her MD told her to not take Aspirin.   Latex     Tape= burns   Nsaids Other (See Comments)    S/P gastric bypass-told not to take. GI bleeds with NSAIDS    Family History  Problem Relation Age of Onset   Breast cancer Mother    Diabetes Mother    Heart disease Father    Heart attack Father    Heart attack Sister    Heart attack Brother    Bladder Cancer Brother    Prostate cancer Brother  Colon polyps Brother    Breast cancer Sister    Breast cancer Maternal Aunt        x 2 aunts   Pancreatic cancer Brother    Colon polyps Brother    Bladder Cancer Cousin    Clotting disorder Sister    Diabetes Sister        x 3   Diabetes Maternal Grandmother     Prior to Admission medications   Medication Sig Start Date End Date Taking? Authorizing Provider  albuterol (VENTOLIN HFA) 108 (90 Base) MCG/ACT inhaler Inhale 1-2 puffs into the lungs every 6 (six) hours as needed for wheezing or shortness of breath. 10/22/21   Eustace Moore, MD  atorvastatin (LIPITOR) 80 MG tablet TAKE 1 TABLET(80 MG) BY MOUTH DAILY 09/17/19   Myrlene Broker, MD  baclofen (LIORESAL) 10 MG tablet Take 1 tablet (10 mg total) by mouth daily as needed for muscle spasms. 02/25/22   Myrlene Broker, MD  buPROPion (WELLBUTRIN XL) 150 MG 24 hr tablet TAKE 1 TABLET BY MOUTH EVERY DAY 03/23/21   Myrlene Broker, MD  cyanocobalamin (VITAMIN B12) 1000 MCG/ML injection INJECT INTO THE MUSCLE EVERY 30 DAYS 09/08/22   Myrlene Broker, MD  ELIQUIS 5 MG TABS tablet TAKE ONE TABLET BY MOUTH TWICE A DAY 12/18/21   Lyn Records, MD  empagliflozin (JARDIANCE) 10 MG TABS tablet Take 1 tablet (10 mg total) by mouth daily before breakfast. 04/05/22   Lyn Records, MD  HYDROcodone-acetaminophen (NORCO/VICODIN) 5-325 MG tablet Take 1 tablet by mouth every 6 (six) hours as needed. 09/27/22    Myrlene Broker, MD  insulin glargine (LANTUS) 100 UNIT/ML injection Inject into the skin daily. Sliding Scale    [provider]  insulin lispro (HUMALOG) 100 UNIT/ML injection Inject 20 Units into the skin as needed for high blood sugar. If blood sugar over 200.    [provider]  levothyroxine (SYNTHROID) 137 MCG tablet Take 1 tablet (137 mcg total) by mouth daily. 02/18/22   Myrlene Broker, MD  metoprolol succinate (TOPROL-XL) 25 MG 24 hr tablet TAKE 1/2 TABLETS AT BEDTIME 10/12/22   Croitoru, Mihai, MD  nitroGLYCERIN (NITROSTAT) 0.4 MG SL tablet Place 1 tablet (0.4 mg total) under the tongue every 5 (five) minutes as needed for chest pain. 04/09/19   Lyn Records, MD  Potassium Chloride ER 20 MEQ TBCR Take 2 tablets (40 mEq total) by mouth 2 (two) times daily. 10/12/22   Croitoru, Mihai, MD  spironolactone (ALDACTONE) 25 MG tablet Take 0.5 tablets (12.5 mg total) by mouth daily. 10/12/22   Croitoru, Mihai, MD  torsemide (DEMADEX) 20 MG tablet TAKE ONE TABLET BY MOUTH TWICE A DAY 09/16/22   Marinus Maw, MD  Vitamin D, Ergocalciferol, (DRISDOL) 1.25 MG (50000 UNIT) CAPS capsule TAKE 1 CAPSULE BY MOUTH EVERY 7 DAYS Patient taking differently: Take 50,000 Units by mouth every Wednesday. 04/12/22   Myrlene Broker, MD    Physical Exam: Vitals:   12/05/22 0930 12/05/22 1128 12/05/22 1231 12/05/22 1331  BP: (!) 133/94 132/86 117/81 136/81  Pulse: (!) 110 (!) 128 64 82  Resp: (!) 26 (!) 21 20 (!) 21  Temp: 98.7 F (37.1 C) 98.1 F (36.7 C) 97.7 F (36.5 C) 97.8 F (36.6 C)  TempSrc: Oral Oral Oral Oral  SpO2: 96% 96% 96% 98%  Weight:  98.6 kg    Height:  5\' 7"  (1.702 m)     General:  Appears calm and comfortable and is in NAD Eyes:  PERRL, EOMI, normal lids, iris ENT:  grossly normal hearing, lips & tongue, mmm; appropriate dentition Neck:  no LAD, masses or thyromegaly; no carotid bruits Cardiovascular:  tachycardic,regular, no m/r/g. 2+ edema   Respiratory:   CTA bilaterally with no wheezes/rales/rhonchi.  Normal respiratory effort. Abdomen:  soft, NT, ND, NABS Back:   normal alignment, no CVAT Skin:  no rash or induration seen on limited exam Musculoskeletal:  grossly normal tone BUE/BLE, good ROM, no bony abnormality Lower extremity:    Limited foot exam with no ulcerations.  2+ distal pulses. Psychiatric:  grossly normal mood and affect, speech fluent and appropriate, AOx3 Neurologic:  CN 2-12 grossly intact, moves all extremities in coordinated fashion, sensation intact   Radiological Exams on Admission: Independently reviewed - see discussion in A/P where applicable  CT Head Wo Contrast  Result Date: 12/05/2022 CLINICAL DATA:  Dizziness for 2 days and feeling unwell EXAM: CT HEAD WITHOUT CONTRAST TECHNIQUE: Contiguous axial images were obtained from the base of the skull through the vertex without intravenous contrast. RADIATION DOSE REDUCTION: This exam was performed according to the departmental dose-optimization program which includes automated exposure control, adjustment of the mA and/or kV according to patient size and/or use of iterative reconstruction technique. COMPARISON:  CT head 08/13/2021 FINDINGS: Brain: No intracranial hemorrhage, mass effect, or evidence of acute infarct. No hydrocephalus. No extra-axial fluid collection. Generalized cerebral atrophy. Ill-defined hypoattenuation within the cerebral white matter is nonspecific but consistent with chronic small vessel ischemic disease. Left basal ganglia lacunar infarct. Vascular: No hyperdense vessel. Intracranial arterial calcification. Skull: No fracture or focal lesion. Sinuses/Orbits: No acute finding. Paranasal sinuses and mastoid air cells are well aerated. Other: None. IMPRESSION: 1. No acute intracranial abnormality. Electronically Signed   By: Minerva Festeryler  Stutzman M.D.   On: 12/05/2022 00:08   DG Chest Portable 1 View  Result Date: 12/04/2022 CLINICAL DATA:   Dizziness and tachycardia EXAM: PORTABLE CHEST 1 VIEW COMPARISON:  Radiographs 02/14/2019 FINDINGS: Stable cardiomegaly. Left chest wall pacemaker. Aortic atherosclerotic calcification. No focal consolidation, pleural effusion, or pneumothorax. No acute osseous abnormality. IMPRESSION: No active disease. Electronically Signed   By: Minerva Festeryler  Stutzman M.D.   On: 12/04/2022 23:00    EKG: Independently reviewed. Junctional tachycardia with rate 129; nonspecific ST changes with no evidence of acute ischemia   Labs on Admission: I have personally reviewed the available labs and imaging studies at the time of the admission.  Pertinent labs:   BUN: 27,  creatinine: 1.48  Assessment and Plan: Principal Problem:   Pacemaker-mediated tachycardia Active Problems:   Weakness   Chronic combined systolic (congestive) and diastolic (congestive) heart failure   Hypothyroidism   PAF (paroxysmal atrial fibrillation)   Hypertension   Diabetes mellitus with complication   Coronary artery disease of native artery of native heart with stable angina pectoris   Hyperlipidemia associated with type 2 diabetes mellitus   Depression   CKD (chronic kidney disease) stage 3, GFR 30-59 ml/min   COPD (chronic obstructive pulmonary disease)    Assessment and Plan: * Pacemaker-mediated tachycardia 82 year old female with history of CHB s/p PPM placement presenting with 2 day history of extreme weakness and fatigue and dizziness found to have PPM induced tachycardia -obs to telemetry -cards consulted, EP seen and following -CT head negative, dizziness resolved -metoprolol increased. On toprol XL 12.5mg  daily, EP changed to metoprolol 25mg  Bid  -EP programmed her device DDI 85 bpm for now -  continue telemetry  -OT/PT consult    Weakness Likely multifactorial with cardiac, hypothyroidism not controlled and possible UTI -she Is completely asymptomatic from a UTI perspective. Hold rocephin, f/u on culture   -cardiology to see for PPM induced tachycardia -thyroid labs pending (TSH >20 in 6/23)  -B12 deficiency, was doing shots, but slightly decreased  -PT/OT eval  -fall precautions   Chronic combined systolic (congestive) and diastolic (congestive) heart failure States she has gained quite a bit of weight with increased LE swelling Could be secondary to PPM induced tachycardia  BNP pending  Echo in 2019: EF of 40-45% with diffuse hypokinesis  Update echo  Lasix x1 IV monitor output and adjust  Strict I/o and monitor weight  She was 204 pounds at end of January 2024>212# 2/29 and 3/28>222.  Could also be secondary to thyroid disease    Hypothyroidism TSH 20 back in 6/23 with no f/u labs Check TSH/free T4, could be contributing to her weakness  TSH >30, increase synthroid to . Need to make sure taking on empty stomach every morning   PAF (paroxysmal atrial fibrillation) S/p PPM Continue eliquis and metoprolol   Hypertension Controlled, metoprolol increased to 25mg  BID, on toprolol XL at home 12.5mg  QHS Continue spironolactone Monitor and adjust as needed   Diabetes mellitus with complication Poorly controlled diabetes. A1C in 08/2022 of 10.4 Continue long acting insulin  Continue jardiance SSI and accuchecks qac/hs   Coronary artery disease of native artery of native heart with stable angina pectoris No chest pain Continue medical management   Hyperlipidemia associated with type 2 diabetes mellitus Continue high intensity statin   Depression Continue wellbutrin   CKD (chronic kidney disease) stage 3, GFR 30-59 ml/min Baseline creatinine 1.3-1.4 Stable, continue to monitor   COPD (chronic obstructive pulmonary disease) No signs/sx of exacerbation On SABA prn      Advance Care Planning:   Code Status: Full Code   Consults: cardiology: Dr. Nelly Laurence   DVT Prophylaxis: eliquis   Family Communication: husband at bedside   Severity of Illness: The  appropriate patient status for this patient is INPATIENT. Inpatient status is judged to be reasonable and necessary in order to provide the required intensity of service to ensure the patient's safety. The patient's presenting symptoms, physical exam findings, and initial radiographic and laboratory data in the context of their chronic comorbidities is felt to place them at high risk for further clinical deterioration. Furthermore, it is not anticipated that the patient will be medically stable for discharge from the hospital within 2 midnights of admission.   * I certify that at the point of admission it is my clinical judgment that the patient will require inpatient hospital care spanning beyond 2 midnights from the point of admission due to high intensity of service, high risk for further deterioration and high frequency of surveillance required.*  Author: Orland Mustard, MD 12/05/2022 2:50 PM  For on call review [email or web address].

## 2022-12-05 NOTE — ED Notes (Signed)
Report to [facility].  

## 2022-12-05 NOTE — ED Notes (Signed)
[employer] paging service contacted due to no report faxed from interrogation up to this point. Per rep will fax most recent report asap.

## 2022-12-05 NOTE — ED Notes (Signed)
Pt sitting on side of bed , pt given longer charger and water

## 2022-12-05 NOTE — Consult Note (Signed)
ELECTROPHYSIOLOGY CONSULT NOTE    Patient ID: Mary Harshancy S Bartelt MRN: [id number], DOB/AGE: 81/01/1941 81 y.o.  Admit date: 12/04/2022 Date of Consult: 12/05/2022  Primary Physician: Laroy AppleValente, Louis R, PA-C Primary Cardiologist:  Electrophysiologist: Ladona Ridgelaylor  Patient Profile: Mary [name] is a 82 y.o. female with a history of CHB s/p pacemaker who is being seen today for the evaluation of tachycardia at the request of Dr. Artis FlockWolfe.    HPI:  Mary Harshancy S Michalik is a 82 y.o. 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  She presented to the ER with dizziness, palpitations,weakness and fatigue that been present for 2 days. She has also noticed some increased swelling in her ankles.  She was seen by Dr. Ladona Ridgelaylor in electrophysiology clinic on March 28.  This note on that date mentions that the patient was in an atrial tachycardia and he attempted to pace her out of it.  The ECG shows what appears to be very low atrial amplitude, perhaps atrial noncapture.  Her atrial amplitude was noted to be very low, so sensing threshold was decreased to 0.15 mV.  She was noted to have occasional sensed atrial rates at 120 bpm.  Max tracking rate was increased to 130 bpm to prevent pacemaker Wenckebach. A Zio monitor was placed, worn for 3 days and returned.   She has not had chest pain, syncope.   Past Medical History:  Diagnosis Date   Arthritis    Atrial tachycardia 04/23/2017   CAD (coronary artery disease)    non obstructive   Cardiac pacemaker in situ - St Jude 05/13/2014   Permanent pacemaker for second-degree heart block. Procedure complicated by an atrial lead dislodgment and repeat procedure, 12/2013    Carotid stenosis    Carotid US 5/16:  Bilateral ICA 1-39%; L vertebral retrograde; L BP 126/49, R BP 140/57   Chronic combined systolic and diastolic CHF  (congestive heart failure)    Chronic lower back pain    Colon cancer    a. s/p chemo   Colon polyps    Diabetic peripheral neuropathy    DVT (deep venous thrombosis) 2013   "twice behind knee on left side" (06/25/2013)   Glaucoma    History of chicken pox    Hyperlipidemia    Hypertension    Hypothyroidism    Iron deficiency anemia    Multinodular goiter    Osteoporosis    PAF (paroxysmal atrial fibrillation)    Sleep apnea    a. resolved post weight loss    Type II diabetes mellitus      Surgical History:  Past Surgical History:  Procedure Laterality Date   CARDIAC CATHETERIZATION  2006   Never had PCI, 3 [place]   CARDIAC CATHETERIZATION N/A 02/04/2015   Procedure: Left Heart Cath and Coronary Angiography;  Surgeon: Lyn RecordsHenry W Smith, MD;  [facility];  Service: Cardiovascular;  Laterality: N/A;   CATARACT EXTRACTION, BILATERAL Bilateral 2013   "and put stent in my left eye for glaucoma" (06/25/2013)   CHOLECYSTECTOMY  1980's   COLECTOMY  10/2010   Tumor removal   EYE MUSCLE SURGERY Bilateral ~ 1963   "muscles too long; eyes would go out and up; tied muscles to hold my eyes straight" (06/25/2013)   LEAD REVISION  01-03-2014   atrial lead revision by Dr Johney FrameAllred   LEAD REVISION  N/A 01/03/2014   Procedure: LEAD REVISION;  Surgeon: Gardiner Rhyme, MD;  Location: [facility] CATH LAB;  Service: Cardiovascular;  Laterality: N/A;   LEFT HEART CATH AND CORONARY ANGIOGRAPHY N/A 05/29/2018   Procedure: LEFT HEART CATH AND CORONARY ANGIOGRAPHY;  Surgeon: Lyn Records, MD;  Location: [facility] INVAS[facility] CV LAB;  Service: Cardiovascular;  Laterality: N/A;   PACEMAKER INSERTION  01/02/2014   STJ Assurity dual chamber pacemaker implnated by Dr Ladona Ridgel for SSS   PERMANENT PACEMAKER INSERTION N/A 01/02/2014   Procedure: PERMANENT PACEMAKER INSERTION;  Surgeon: Marinus Maw, MD;  Location: [facility] C[facility];  Service: Cardiovascular;  Laterality: N/A;   PORTACATH PLACEMENT Left  11/2010   PPM GENERATOR CHANGEOUT N/A 08/16/2022   Procedure: PPM GENERATOR CHANGEOUT;  Surgeon: Marinus Maw, MD;  Location: [facility] INVASIVE CV LAB;  Service: Cardiovascular;  Laterality: N/A;   ROUX-EN-Y GASTRIC BYPASS  2009   ULNAR TUNNEL RELEASE Left ~ 2008   UMBILICAL HERNIA REPAIR  1970's?    (06/25/2013)     Medications Prior to Admission  Medication Sig Dispense Refill Last Dose   albuterol (VENTOLIN HFA) 108 (90 Base) MCG/ACT inhaler Inhale 1-2 puffs into the lungs every 6 (six) hours as needed for wheezing or shortness of breath. 18 g 0    atorvastatin (LIPITOR) 80 MG tablet TAKE 1 TABLET(80 MG) BY MOUTH DAILY 90 tablet 0    baclofen (LIORESAL) 10 MG tablet Take 1 tablet (10 mg total) by mouth daily as needed for muscle spasms. 30 each 0    buPROPion (WELLBUTRIN XL) 150 MG 24 hr tablet TAKE 1 TABLET BY MOUTH EVERY DAY 90 tablet 1    cyanocobalamin (VITAMIN B12) 1000 MCG/ML injection INJECT INTO THE MUSCLE EVERY 30 DAYS 1 mL 1    ELIQUIS 5 MG TABS tablet TAKE ONE TABLET BY MOUTH TWICE A DAY 180 tablet 1    empagliflozin (JARDIANCE) 10 MG TABS tablet Take 1 tablet (10 mg total) by mouth daily before breakfast.      HYDROcodone-acetaminophen (NORCO/VICODIN) 5-325 MG tablet Take 1 tablet by mouth every 6 (six) hours as needed. 30 tablet 0    insulin glargine (LANTUS) 100 UNIT/ML injection Inject into the skin daily. Sliding Scale      insulin lispro (HUMALOG) 100 UNIT/ML injection Inject 20 Units into the skin as needed for high blood sugar. If blood sugar over 200.      levothyroxine (SYNTHROID) 137 MCG tablet Take 1 tablet (137 mcg total) by mouth daily. 90 tablet 3    metoprolol succinate (TOPROL-XL) 25 MG 24 hr tablet TAKE 1/2 TABLETS AT BEDTIME 45 tablet 0    nitroGLYCERIN (NITROSTAT) 0.4 MG SL tablet Place 1 tablet (0.4 mg total) under the tongue every 5 (five) minutes as needed for chest pain. 25 tablet 2    Potassium Chloride ER 20 MEQ TBCR Take 2 tablets (40 mEq total) by  mouth 2 (two) times daily. 360 tablet 0    spironolactone (ALDACTONE) 25 MG tablet Take 0.5 tablets (12.5 mg total) by mouth daily. 45 tablet 0    torsemide (DEMADEX) 20 MG tablet TAKE ONE TABLET BY MOUTH TWICE A DAY 60 tablet 3    Vitamin D, Ergocalciferol, (DRISDOL) 1.25 MG (50000 UNIT) CAPS capsule TAKE 1 CAPSULE BY MOUTH EVERY 7 DAYS (Patient taking differently: Take 50,000 Units by mouth every Wednesday.) 12 capsule 0     Inpatient Medications:   apixaban  5 mg Oral BID   insulin aspart  0-9  Units Subcutaneous TID WC    Allergies:  Allergies  Allergen Reactions   Other Other (See Comments)    NO Blind Scopes with Naso Gastric tube.  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   Latex     Tape= burns   Nsaids Other (See Comments)    S/P gastric bypass-told not to take. GI bleeds with NSAIDS    Social History   Socioeconomic History   Marital status: Married    Spouse name: Chrissie Noa   Number of children: 0   Years of education: Not on file   Highest education level: Not on file  Occupational History   Occupation: Retired    Comment: office work  Tobacco Use   Smoking status: Former    Packs/day: 1.00    Years: 30.00    Additional pack years: 0.00    Total pack years: 30.00    Types: Cigarettes    Quit date: 02/2017    Years since quitting: 5.7   Smokeless tobacco: Never   Tobacco comments:    started in her [age] until 2009   Vaping Use   Vaping Use: Never used  Substance and Sexual Activity   Alcohol use: Not Currently    Alcohol/week: 0.0 standard drinks of alcohol   Drug use: No   Sexual activity: Not on file  Other Topics Concern   Not on file  Social History Narrative   Married to husband,  Chrissie Noa   No children   Retired Hospital doctor to Electrical engineer   Recently moved back here from [place] [employer] [place] [employer] Strain: Low Risk  (12/01/2017)   Overall Financial Resource Strain (CARDIA)    Difficulty of Paying Living Expenses: Not very hard  Food Insecurity: No Food Insecurity (12/01/2017)   Hunger Vital Sign    Worried About Running Out of Food in the Last Year: Never true    Ran Out of Food in the Last Year: Never true  Transportation Needs: No Transportation Needs (12/01/2017)   PRAPARE - Administrator, Civil Service (Medical): No    Lack of Transportation (Non-Medical): No  Physical Activity: Inactive (12/01/2017)   Exercise Vital Sign    Days of Exercise per Week: 0 days    Minutes of Exercise per Session: 0 min  Stress: No Stress Concern Present (05/23/2019)   [employer] of Occupational Health - Occupational Stress Questionnaire    Feeling of Stress : Not at all  Social Connections: Socially Integrated (12/01/2017)   Social Connection and Isolation Panel [NHANES]    Frequency of Communication with Friends and Family: More than three times a week    Frequency of Social Gatherings with Friends and Family: More than three times a week    Attends Religious Services: More than 4 times per year    Active Member of [employer] or Organizations: Yes    Attends Banker Meetings: More than 4 times per year    Marital Status: Married  Catering manager Violence: Not At Risk (12/01/2017)   Humiliation, Afraid, Rape, and Kick questionnaire    Fear of Current  or Ex-Partner: No    Emotionally Abused: No    Physically Abused: No    Sexually Abused: No     Family History  Problem Relation Age of Onset   Breast cancer Mother    Diabetes Mother    Heart disease Father    Heart attack Father    Heart attack Sister    Heart attack Brother    Bladder Cancer Brother    Prostate cancer Brother    Colon  polyps Brother    Breast cancer Sister    Breast cancer Maternal Aunt        x 2 aunts   Pancreatic cancer Brother    Colon polyps Brother    Bladder Cancer Cousin    Clotting disorder Sister    Diabetes Sister        x 3   Diabetes Maternal Grandmother      Review of Systems: All other systems reviewed and are otherwise negative except as noted above.  Physical Exam: Vitals:   12/05/22 0600 12/05/22 0701 12/05/22 0930 12/05/22 1128  BP: (!) 132/90 115/84 (!) 133/94 132/86  Pulse: (!) 125 (!) 125 (!) 110 (!) 128  Resp: 20 (!) 21 (!) 26 (!) 21  Temp:   98.7 F (37.1 C) 98.1 F (36.7 C)  TempSrc:   Oral Oral  SpO2: 95% 94% 96% 96%  Weight:      Height:        GEN- The patient is well appearing, alert and oriented x 3 today.   HEENT: normocephalic, atraumatic; sclera clear, conjunctiva pink; hearing intact; oropharynx clear; neck supple Lungs- Clear to ausculation bilaterally, normal work of breathing.  No wheezes, rales, rhonchi Heart- Tachy, regular rate and rhythm, no murmurs, rubs or gallops GI- soft, non-tender, non-distended, bowel sounds present Extremities- no clubbing, cyanosis, or edema; DP/PT/radial pulses 2+ bilaterally MS- no significant deformity or atrophy Skin- warm and dry, no rash or lesion Psych- euthymic mood, full affect Neuro- strength and sensation are intact  Labs:   Lab Results  Component Value Date   WBC 8.3 12/04/2022   HGB 12.7 12/04/2022   HCT 39.8 12/04/2022   MCV 89.2 12/04/2022   PLT 332 12/04/2022    Recent Labs  Lab 12/04/22 2243  NA 134*  K 4.2  CL 99  CO2 26  BUN 27*  CREATININE 1.48*  CALCIUM 8.8*  PROT 7.6  BILITOT 0.6  ALKPHOS 98  ALT 8  AST 15  GLUCOSE 124*      Radiology/Studies: CT Head Wo Contrast  Result Date: 12/05/2022 CLINICAL DATA:  Dizziness for 2 days and feeling unwell EXAM: CT HEAD WITHOUT CONTRAST TECHNIQUE: Contiguous axial images were obtained from the base of the skull through the vertex  without intravenous contrast. RADIATION DOSE REDUCTION: This exam was performed according to the departmental dose-optimization program which includes automated exposure control, adjustment of the mA and/or kV according to patient size and/or use of iterative reconstruction technique. COMPARISON:  CT head 08/13/2021 FINDINGS: Brain: No intracranial hemorrhage, mass effect, or evidence of acute infarct. No hydrocephalus. No extra-axial fluid collection. Generalized cerebral atrophy. Ill-defined hypoattenuation within the cerebral white matter is nonspecific but consistent with chronic small vessel ischemic disease. Left basal ganglia lacunar infarct. Vascular: No hyperdense vessel. Intracranial arterial calcification. Skull: No fracture or focal lesion. Sinuses/Orbits: No acute finding. Paranasal sinuses and mastoid air cells are well aerated. Other: None. IMPRESSION: 1. No acute intracranial abnormality. Electronically Signed   By: Minerva Fester  M.D.   On: 12/05/2022 00:08   DG Chest Portable 1 View  Result Date: 12/04/2022 CLINICAL DATA:  Dizziness and tachycardia EXAM: PORTABLE CHEST 1 VIEW COMPARISON:  Radiographs 02/14/2019 FINDINGS: Stable cardiomegaly. Left chest wall pacemaker. Aortic atherosclerotic calcification. No focal consolidation, pleural effusion, or pneumothorax. No acute osseous abnormality. IMPRESSION: No active disease. Electronically Signed   By: Minerva Fester M.D.   On: 12/04/2022 23:00   CUP PACEART INCLINIC DEVICE CHECK  Result Date: 11/25/2022 Pacemaker check in clinic. Normal device function. Thresholds, sensing, impedances consistent with previous measurements.  There is noted undersensing on the A channel, V channel is missing on remote monitoring reports and with appropriate A sensing there is noted tracking of the fast atrial rate around 120 bpm.  Reviewed with Dr. Ladona Ridgel, the following changes made: 1.  RA sensitivity adjusted to AUTO 2.  Upper track rate increased to 130 to  prevent Wenckebach at 120.  (Discussed med control of the fast rate with patient, see provider note). 3.  Ventricular channel was added to monitoring.  Device programmed to maximize longevity. No mode switch or high ventricular rates noted. Device programmed at appropriate safety margins. Histogram distribution appropriate for patient activity level. Device programmed to optimize intrinsic conduction. Estimated longevity 7 years. Patient enrolled in remote follow-up. Patient education completed.Syliva Overman, RN  CUP PACEART REMOTE DEVICE CHECK  Result Date: 11/16/2022 Scheduled remote reviewed. Normal device function.  Presenting markers AP/VP, no V channel tracing, atrial channel appears to be undersensing - route to triage Alert status for RV pacing > limit, PPM indication heart block, discontinue alert in [place] per protocol Next remote 91 days. LA   ZOX:WRUEA -- V paced at 85 bpm with low atrial amplitude (personally reviewed)  TELEMETRY: V-paced (personally reviewed)  DEVICE HISTORY: Device was interrogated earlier today. She has been in what appears to be an atrial tachycardia since. A-rate is 120-130 bpm with 1:1 tracking by the pacemaker.  Programming on arrival: DDDR 46 Max track rate 130 bpm Underlying rhythm is AT with CHB, no sensed ventricular beats  Assessment/Plan: Pacemaker upper rate behavior Underlying rhythm happens to be atrial arrhythmia at about 125 bpm Pacemaker is tracking the tachycardia I programmed her DDI 85 for the time being   Atrial tachycardia ECG is pending I programmed her device DDI 85 bpm for now Increase metoprolol for now; BP was high last office visit  Paroxysmal atrial fibrillation Continue eliquis  Complete heart block V-paced and device dependent at 35 bpm  Atrial undersensing Atrium appears appropriately sensed today  Hypothyroidism Last TSH here was 20.9 in June, 2023.  Repeat labs pending  Possible UTI Pt received dose of  rocephin   For questions or updates, please contact [facility] Please consult [email or web address] for contact info under Cardiology/STEMI.  Signed, York Pellant, MD 12/05/2022 12:10 PM

## 2022-12-05 NOTE — Progress Notes (Signed)
Arrived to [facility] 2C Room 4 via [facility] from [facility].  Oriented to room and placed on cardiac monitor.   12/05/22 1128  Assess: MEWS Score  Temp 98.1 F (36.7 C)  BP 132/86  MAP (mmHg) 101  Pulse Rate (!) 128  ECG Heart Rate (!) 129  Resp (!) 21  Level of Consciousness Alert  SpO2 96 %  O2 Device Room Air  Assess: MEWS Score  MEWS Temp 0  MEWS Systolic 0  MEWS Pulse 2  MEWS RR 1  MEWS LOC 0  MEWS Score 3  MEWS Score Color Yellow  Assess: if the MEWS score is Yellow or Red  Were vital signs taken at a resting state? Yes  Focused Assessment No change from prior assessment  Does the patient meet 2 or more of the SIRS criteria? Yes  Does the patient have a confirmed or suspected source of infection? Yes  Provider and Rapid Response Notified? Yes  MEWS guidelines implemented  Yes, yellow  Treat  MEWS Interventions Considered administering scheduled or prn medications/treatments as ordered  Take Vital Signs  Increase Vital Sign Frequency  Yellow: Q2hr x1, continue Q4hrs until patient remains green for 12hrs  Escalate  MEWS: Escalate Yellow: Discuss with charge nurse and consider notifying provider and/or RRT  Notify: Charge Nurse/RN  Name of Charge Nurse/RN Notified Meridee Score RN  Assess: SIRS CRITERIA  SIRS Temperature  0  SIRS Pulse 1  SIRS Respirations  1  SIRS WBC 0  SIRS Score Sum  2

## 2022-12-05 NOTE — Assessment & Plan Note (Signed)
Poorly controlled diabetes. A1C in 08/2022 of 10.4 Continue long acting insulin  Continue jardiance SSI and accuchecks qac/hs

## 2022-12-05 NOTE — ED Notes (Signed)
Discussed medications ordered with edp. Per new orders, rn to give 500 mL and reassess prior to additional fluids d/t hx chf

## 2022-12-06 DIAGNOSIS — I499 Cardiac arrhythmia, unspecified: Secondary | ICD-10-CM | POA: Diagnosis not present

## 2022-12-06 LAB — GLUCOSE, CAPILLARY
Glucose-Capillary: 141 mg/dL — ABNORMAL HIGH (ref 70–99)
Glucose-Capillary: 161 mg/dL — ABNORMAL HIGH (ref 70–99)

## 2022-12-06 LAB — CBC
HCT: 37.9 % (ref 36.0–46.0)
Hemoglobin: 12.2 g/dL (ref 12.0–15.0)
MCH: 28.6 pg (ref 26.0–34.0)
MCHC: 32.2 g/dL (ref 30.0–36.0)
MCV: 89 fL (ref 80.0–100.0)
Platelets: 291 10*3/uL (ref 150–400)
RBC: 4.26 MIL/uL (ref 3.87–5.11)
RDW: 13.4 % (ref 11.5–15.5)
WBC: 12.4 10*3/uL — ABNORMAL HIGH (ref 4.0–10.5)
nRBC: 0 % (ref 0.0–0.2)

## 2022-12-06 LAB — BASIC METABOLIC PANEL
Anion gap: 11 (ref 5–15)
BUN: 25 mg/dL — ABNORMAL HIGH (ref 8–23)
CO2: 28 mmol/L (ref 22–32)
Calcium: 8.9 mg/dL (ref 8.9–10.3)
Chloride: 97 mmol/L — ABNORMAL LOW (ref 98–111)
Creatinine, Ser: 1.53 mg/dL — ABNORMAL HIGH (ref 0.44–1.00)
GFR, Estimated: 34 mL/min — ABNORMAL LOW (ref >60)
Glucose, Bld: 160 mg/dL — ABNORMAL HIGH (ref 70–99)
Potassium: 4.3 mmol/L (ref 3.5–5.1)
Sodium: 136 mmol/L (ref 135–145)

## 2022-12-06 MED ORDER — METOPROLOL TARTRATE 25 MG PO TABS: 25.0000 mg | ORAL_TABLET | Freq: Two times a day (BID) | ORAL | 1 refills | Status: DC

## 2022-12-06 MED ORDER — CEPHALEXIN 500 MG PO CAPS: 500.0000 mg | ORAL_CAPSULE | Freq: Two times a day (BID) | ORAL | 0 refills | Status: AC

## 2022-12-06 NOTE — Progress Notes (Unsigned)
Cardiology Office Note:    Date:  12/06/2022   ID:  Mary [name], DOB March 29, 1941, MRN [id number]  PCP:  Cleophus Molt [facility] Cardiologist: Lesleigh Noe, MD (Inactive)   Reason for visit: 18-month follow-up  History of Present Illness:    Mary [name] is a 82 y.o. 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   She was last seen by Dr. Katrinka Blazing in September 2023 and was doing well.  Stopped smoking.  Today, ***  Coronary artery disease -non-obstructive by left heart cath -***  Chronic diastolic heart failure -***  Paroxysmal atrial fibrillation -Continue Eliquis for stroke prevention  Tachybradycardia syndrome -Status post pacemaker implant -Generator change out in December 2023 -Followed by Dr. Ladona Ridgel  Hypertension -*** -Goal BP is <130/80.  Recommend DASH diet (high in vegetables, fruits, low-fat dairy products, whole grains, poultry, fish, and nuts and low in sweets, sugar-sweetened beverages, and red meats), salt restriction and increase physical activity.  Hyperlipidemia -*** -Discussed cholesterol lowering diets - Mediterranean diet, DASH diet, vegetarian diet, low-carbohydrate diet and avoidance of trans fats.  Discussed healthier choice substitutes.  Nuts, high-fiber foods, and fiber supplements may also improve lipids.    Obesity -Discussed how even a 5-10% weight loss can have cardiovascular benefits.   -Recommend moderate intensity activity for 30 minutes 5 days/week and the DASH diet.  Tobacco use  -Recommend tobacco cessation.  Reviewed physiologic effects of nicotine and the immediate-eventual benefits of quitting including improvement in cough/breathing and reduction in cardiovascular events.  Discussed quitting tips such as removing triggers and getting support  from family/friends and Quitline [place]. -USPSTF recommends one-time screening for abdominal aortic aneurysm (AAA) by ultrasound in men 61 -31 years old who have ever smoked.      Disposition - Follow-up in ***     Past Medical History:  Diagnosis Date   Arthritis    Atrial tachycardia 04/23/2017   CAD (coronary artery disease)    non obstructive   Cardiac pacemaker in situ - St Jude 05/13/2014   Permanent pacemaker for second-degree heart block. Procedure complicated by an atrial lead dislodgment and repeat procedure, 12/2013    Carotid stenosis    Carotid US 5/16:  Bilateral ICA 1-39%; L vertebral retrograde; L BP 126/49, R BP 140/57   Chronic combined systolic and diastolic CHF (congestive heart failure)    Chronic lower back pain    Colon cancer    a. s/p chemo   Colon polyps    Diabetic peripheral neuropathy    DVT (deep venous thrombosis) 2013   "twice behind knee on left side" (06/25/2013)   Glaucoma    History of chicken pox    Hyperlipidemia    Hypertension    Hypothyroidism    Iron deficiency anemia    Multinodular goiter    Osteoporosis    PAF (paroxysmal atrial fibrillation)    Sleep apnea    a. resolved post weight loss    Type II diabetes mellitus     Past Surgical History:  Procedure Laterality Date   CARDIAC CATHETERIZATION  2006   Never had PCI, 3 caths total. Last one in [place]   CARDIAC CATHETERIZATION N/A 02/04/2015   Procedure: Left Heart Cath and Coronary Angiography;  Surgeon: Lyn Records, MD;  Location: [facility] INVASIVE CV LAB;  Service: Cardiovascular;  Laterality: N/A;   CATARACT EXTRACTION, BILATERAL Bilateral 2013   "and put stent in my left eye for glaucoma" (06/25/2013)   CHOLECYSTECTOMY  1980's   COLECTOMY  10/2010   Tumor removal   EYE MUSCLE SURGERY Bilateral ~ 1963   "muscles too long; eyes would go out and up; tied muscles to hold my eyes straight" (06/25/2013)   LEAD REVISION  01-03-2014   atrial lead revision by Dr Johney Frame   LEAD REVISION  N/A 01/03/2014   Procedure: LEAD REVISION;  Surgeon: Gardiner Rhyme, MD;  Location: [facility] CATH LAB;  Service: Cardiovascular;  Laterality: N/A;   LEFT HEART CATH AND CORONARY ANGIOGRAPHY N/A 05/29/2018   Procedure: LEFT HEART CATH AND CORONARY ANGIOGRAPHY;  Surgeon: Lyn Records, MD;  Location: [facility] INVASIVE CV LAB;  Service: Cardiovascular;  Laterality: N/A;   PACEMAKER INSERTION  01/02/2014   STJ Assurity dual chamber pacemaker implnated by Dr Ladona Ridgel for SSS   PERMANENT PACEMAKER INSERTION N/A 01/02/2014   Procedure: PERMANENT PACEMAKER INSERTION;  Surgeon: Marinus Maw, MD;  Location: [facility] CATH LAB;  Service: Cardiovascular;  Laterality: N/A;   PORTACATH PLACEMENT Left 11/2010   PPM GENERATOR CHANGEOUT N/A 08/16/2022   Procedure: PPM GENERATOR CHANGEOUT;  Surgeon: Marinus Maw, MD;  Location: [facility] INVASIVE CV LAB;  Service: Cardiovascular;  Laterality: N/A;   ROUX-EN-Y GASTRIC BYPASS  2009   ULNAR TUNNEL RELEASE Left ~ 2008   UMBILICAL HERNIA REPAIR  1970's?    (06/25/2013)    Current Medications: No outpatient medications have been marked as taking for the 12/08/22 encounter (Appointment) with Cannon Kettle, PA-C.     Allergies:   Other, Adhesive [tape], Aspirin, Latex, and Nsaids   Social History   Socioeconomic History   Marital status: Married    Spouse name: Chrissie Noa   Number of children: 0   Years of education: Not on file   Highest education level: Not on file  Occupational History   Occupation: Retired    Comment: office work  Tobacco Use   Smoking status: Former    Packs/day: 1.00    Years: 30.00    Additional pack years: 0.00    Total pack years: 30.00    Types: Cigarettes    Quit date: 02/2017    Years since quitting: 5.7   Smokeless tobacco: Never   Tobacco comments:    started in her [age] until 2009   Vaping Use   Vaping Use: Never used  Substance and Sexual Activity   Alcohol use: Not Currently    Alcohol/week: 0.0 standard drinks of alcohol   Drug  use: No   Sexual activity: Not on file  Other Topics Concern   Not on file  Social History Narrative   Married to husband, Chrissie Noa   No children   Retired Hospital doctor to Electrical engineer   Recently moved back here from [place] [employer] Strain: Low Risk  (12/01/2017)   Overall Financial Resource Strain (CARDIA)    Difficulty of Paying Living Expenses: Not very hard  Food Insecurity: No Food Insecurity (12/05/2022)   Hunger Vital Sign    Worried About Running Out of Food in the Last Year: Never true    Ran Out of Food in the Last Year: Never true  Transportation Needs: No Transportation Needs (12/05/2022)   PRAPARE - Administrator, Civil Service (Medical): No  Lack of Transportation (Non-Medical): No  Physical Activity: Inactive (12/01/2017)   Exercise Vital Sign    Days of Exercise per Week: 0 days    Minutes of Exercise per Session: 0 min  Stress: No Stress Concern Present (05/23/2019)   [employer] - Occupational Stress Questionnaire    Feeling of Stress : Not at all  Social Connections: Socially Integrated (12/01/2017)   Social Connection and Isolation Panel [NHANES]    Frequency of Communication with Friends and Family: More than three times a week    Frequency of Social Gatherings with Friends and Family: More than three times a week    Attends Religious Services: More than 4 times per year    Active Member of [employer] Financial[employer]ubs or Organizations: Yes    Attends Engineer, structuralClub or Organization Meetings: More than 4 times per year    Marital Status: Married     Family History: The patient's family history includes Bladder Cancer in her brother and cousin; Breast cancer in her maternal aunt, mother, and sister; Clotting disorder in her sister; Colon polyps in her brother and brother; Diabetes in her maternal grandmother, mother, and sister; Heart attack in her brother, father, and sister; Heart disease  in her father; Pancreatic cancer in her brother; Prostate cancer in her brother.  ROS:   Please see the history of present illness.     EKGs/Labs/Other Studies Reviewed:    EKG:  The ekg ordered today demonstrates ***  Recent Labs: 12/04/2022: ALT 8; Magnesium 1.8 12/05/2022: B Natriuretic Peptide 134.5; TSH 32.524 12/06/2022: BUN 25; Creatinine, Ser 1.53; Hemoglobin 12.2; Platelets 291; Potassium 4.3; Sodium 136   Recent Lipid Panel Lab Results  Component Value Date/Time   CHOL 241 (H) 09/27/2022 03:28 PM   TRIG 303.0 (H) 09/27/2022 03:28 PM   HDL 49.60 09/27/2022 03:28 PM   LDLCALC 73 05/25/2018 05:31 AM   LDLDIRECT 152.0 09/27/2022 03:28 PM    Physical Exam:    VS:  There were no vitals taken for this visit.   No data found.   Wt Readings from Last 3 Encounters:  12/06/22 214 lb 8.1 oz (97.3 kg)  11/25/22 222 lb 12.8 oz (101.1 kg)  09/27/22 205 lb (93 kg)     GEN: *** Well nourished, well developed in no acute distress HEENT: Normal NECK: No JVD; No carotid bruits CARDIAC: ***RRR, no murmurs, rubs, gallops RESPIRATORY:  Clear to auscultation without rales, wheezing or rhonchi  ABDOMEN: Soft, non-tender, non-distended MUSCULOSKELETAL: No edema SKIN: Warm and dry NEUROLOGIC:  Alert and oriented PSYCHIATRIC:  Normal affect     ASSESSMENT AND PLAN   ***   {Are you ordering a CV Procedure (e.g. stress test, cath, DCCV, TEE, etc)?   Press F2        :[id number]}    Medication Adjustments/Labs and Tests Ordered: Current medicines are reviewed at length with the patient today.  Concerns regarding medicines are outlined above.  No orders of the defined types were placed in this encounter.  No orders of the defined types were placed in this encounter.   There are no Patient Instructions on file for this visit.   Signed, Cannon KettleJennifer K Sherra Kimmons, PA-C  12/06/2022 2:06 PM    [facility]

## 2022-12-06 NOTE — Discharge Summary (Signed)
Physician Discharge Summary  Mary [name]:[id number] DOB: 11/17/1940 DOA: 12/04/2022  PCP: Laroy Apple, PA-C  Admit date: 12/04/2022  Discharge date: 12/06/2022  Admitted From: Home.  Disposition: Home.  Recommendations for Outpatient Follow-up:  Follow up with PCP in 1-2 weeks. Please obtain BMP/CBC in one week. Advised to follow-up with Cardiology as scheduled. Advised to take metoprolol 25 mg twice daily. Advised to take Keflex for UTI.  Home Health: None Equipment/Devices: None  Discharge Condition: Stable CODE STATUS: Full code Diet recommendation: Heart Healthy  Brief [facility] Course: This  82 y.o. female with medical history significant of CHB, s/p PPM insertion, parosymal atrial fib, hypothyroidism, DM 2, hyperlipidemia, COPD, CKD stage III, MDD, HFrEF, and history of colon cancer who presented to ED with 2 day history of dizziness, palpitations and weakness. She was having difficulty ambulating. Her husband wanted her to go to ED then, but she went to bed and slept most of evening/night.  She got up really early on Saturday and she could hardly get going. She does have shortness of breath. She took her blood pressure and it was good, but her heart rate was high (115) and she decided to go to ED.    She tells me in December her PPM stopped working. She had a new PPM placed in December by Dr. Ladona Ridgel. She saw him 2 weeks for PPM follow up and it wasn't working properly. At this time she had some atrial undersensing and her device was reprogrammed to an atrial sensitivity of 0.15.  She had atrial tachycardia and a zio patch was ordered.  She put this on Monday April 1st and wore for 3 days.   Patient was admitted for further evaluation,  electrophysiologist was consulted.  Her PPM device was  re-programmed.  Patient remained in atrial tachycardia but HR reasonably controlled.  She may ultimately require amiodarone for control.  Patient continued on Eliquis.  Patient  cleared from cardiology to be discharged.  Advised to continue current medications.  Patient being discharged home.   Discharge Diagnoses:  Principal Problem:   Pacemaker-mediated tachycardia Active Problems:   Weakness   Chronic combined systolic (congestive) and diastolic (congestive) heart failure   Hypothyroidism   PAF (paroxysmal atrial fibrillation)   Hypertension   Diabetes mellitus with complication   Coronary artery disease of native artery of native heart with stable angina pectoris   Hyperlipidemia associated with type 2 diabetes mellitus   Depression   CKD (chronic kidney disease) stage 3, GFR 30-59 ml/min   COPD (chronic obstructive pulmonary disease)    Discharge Instructions  Discharge Instructions     Call MD for:  difficulty breathing, headache or visual disturbances   Complete by: As directed    Call MD for:  persistant dizziness or light-headedness   Complete by: As directed    Call MD for:  persistant nausea and vomiting   Complete by: As directed    Diet - low sodium heart healthy   Complete by: As directed    Diet Carb Modified   Complete by: As directed    Discharge instructions   Complete by: As directed    Advised to follow-up with primary care physician in 1 week. Advised to follow-up with cardiology as scheduled. Advised to take metoprolol 25 mg twice daily.   Increase activity slowly   Complete by: As directed       Allergies as of 12/06/2022       Reactions   Other Other (  See Comments)   NO Blind Scopes with Naso Gastric tube.  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   Latex    Tape= burns   Nsaids Other (See Comments)   S/P gastric bypass-told not to take. GI bleeds with  NSAIDS        Medication List     STOP taking these medications    metoprolol succinate 25 MG 24 hr tablet Commonly known as: TOPROL-XL       TAKE these medications    albuterol 108 (90 Base) MCG/ACT inhaler Commonly known as: VENTOLIN HFA Inhale 1-2 puffs into the lungs every 6 (six) hours as needed for wheezing or shortness of breath.   atorvastatin 80 MG tablet Commonly known as: LIPITOR TAKE 1 TABLET(80 MG) BY MOUTH DAILY What changed: See the new instructions.   baclofen 10 MG tablet Commonly known as: LIORESAL Take 1 tablet (10 mg total) by mouth daily as needed for muscle spasms.   buPROPion 150 MG 24 hr tablet Commonly known as: WELLBUTRIN XL TAKE 1 TABLET BY MOUTH EVERY DAY What changed:  how much to take how to take this when to take this   cephALEXin 500 MG capsule Commonly known as: KEFLEX Take 1 capsule (500 mg total) by mouth 2 (two) times daily for 5 days.   cyanocobalamin 1000 MCG/ML injection Commonly known as: VITAMIN B12 INJECT INTO THE MUSCLE EVERY 30 DAYS What changed: See the new instructions.   Eliquis 5 MG Tabs tablet Generic drug: apixaban TAKE ONE TABLET BY MOUTH TWICE A DAY What changed: how much to take   empagliflozin 10 MG Tabs tablet Commonly known as: JARDIANCE Take 1 tablet (10 mg total) by mouth daily before breakfast.   HYDROcodone-acetaminophen 5-325 MG tablet Commonly known as: NORCO/VICODIN Take 1 tablet by mouth every 6 (six) hours as needed. What changed: reasons to take this   insulin glargine 100 UNIT/ML injection Commonly known as: LANTUS Inject 20 Units into the skin in the morning. Sliding Scale   insulin lispro 100 UNIT/ML injection Commonly known as: HUMALOG Inject 4 Units into the skin as needed for high blood sugar. If blood sugar over 200.   levothyroxine 137 MCG tablet Commonly known as: SYNTHROID Take 1 tablet (137 mcg total) by mouth daily.   metoprolol tartrate 25 MG tablet Commonly  known as: LOPRESSOR Take 1 tablet (25 mg total) by mouth 2 (two) times daily.   nitroGLYCERIN 0.4 MG SL tablet Commonly known as: NITROSTAT Place 1 tablet (0.4 mg total) under the tongue every 5 (five) minutes as needed for chest pain.   Potassium Chloride ER 20 MEQ Tbcr Take 2 tablets (40 mEq total) by mouth 2 (two) times daily.   spironolactone 25 MG tablet Commonly known as: ALDACTONE Take 0.5 tablets (12.5 mg total) by mouth daily.   torsemide 20 MG tablet Commonly known as: DEMADEX TAKE ONE TABLET BY MOUTH TWICE A DAY   Vitamin D (Ergocalciferol) 1.25 MG (50000 UNIT) Caps capsule Commonly known as: DRISDOL TAKE 1 CAPSULE BY MOUTH EVERY 7 DAYS What changed: when to take this        Follow-up Information     Laroy Apple, [place]  Follow up in 1 week(s).   Specialty: General Practice Contact information: [address] [place] [address] [phone]         Antoine Poche, MD Follow up in 1 week(s).   Specialty: Cardiology Contact information: [address] [place] [address] [phone]                Allergies  Allergen Reactions   Other Other (See Comments)    NO Blind Scopes with Naso Gastric tube.  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   Latex     Tape= burns   Nsaids Other (See Comments)    S/P gastric bypass-told not to take. GI bleeds with NSAIDS    Consultations: Cardiology   Procedures/Studies: CT Head Wo Contrast  Result Date: 12/05/2022 CLINICAL DATA:  Dizziness for 2 days and feeling unwell EXAM: CT HEAD WITHOUT CONTRAST TECHNIQUE: Contiguous axial images were obtained from the base of the skull through the vertex without intravenous  contrast. RADIATION DOSE REDUCTION: This exam was performed according to the departmental dose-optimization program which includes automated exposure control, adjustment of the mA and/or kV according to patient size and/or use of iterative reconstruction technique. COMPARISON:  CT head 08/13/2021 FINDINGS: Brain: No intracranial hemorrhage, mass effect, or evidence of acute infarct. No hydrocephalus. No extra-axial fluid collection. Generalized cerebral atrophy. Ill-defined hypoattenuation within the cerebral white matter is nonspecific but consistent with chronic small vessel ischemic disease. Left basal ganglia lacunar infarct. Vascular: No hyperdense vessel. Intracranial arterial calcification. Skull: No fracture or focal lesion. Sinuses/Orbits: No acute finding. Paranasal sinuses and mastoid air cells are well aerated. Other: None. IMPRESSION: 1. No acute intracranial abnormality. Electronically Signed   By: Minerva Fester M.D.   On: 12/05/2022 00:08   DG Chest Portable 1 View  Result Date: 12/04/2022 CLINICAL DATA:  Dizziness and tachycardia EXAM: PORTABLE CHEST 1 VIEW COMPARISON:  Radiographs 02/14/2019 FINDINGS: Stable cardiomegaly. Left chest wall pacemaker. Aortic atherosclerotic calcification. No focal consolidation, pleural effusion, or pneumothorax. No acute osseous abnormality. IMPRESSION: No active disease. Electronically Signed   By: Minerva Fester M.D.   On: 12/04/2022 23:00   CUP PACEART INCLINIC DEVICE CHECK  Result Date: 11/25/2022 Pacemaker check in clinic. Normal device function. Thresholds, sensing, impedances consistent with previous measurements.  There is noted undersensing on the A channel, V channel is missing on remote monitoring reports and with appropriate A sensing there is noted tracking of the fast atrial rate around 120 bpm.  Reviewed with Dr. Ladona Ridgel, the following changes made: 1.  RA sensitivity adjusted to AUTO 2.  Upper track rate increased to 130 to prevent Wenckebach  at 120.  (Discussed med control of the fast rate with patient, see provider note). 3.  Ventricular channel was added to monitoring.  Device programmed to maximize longevity. No mode switch or high ventricular rates noted. Device programmed at appropriate safety margins. Histogram distribution appropriate for patient activity level. Device programmed to optimize intrinsic conduction. Estimated longevity 7 years. Patient enrolled in remote follow-up. Patient education completed.Syliva Overman, RN  CUP PACEART REMOTE DEVICE CHECK  Result Date: 11/16/2022 Scheduled remote reviewed. Normal device function.  Presenting markers AP/VP,  no V channel tracing, atrial channel appears to be undersensing - route to triage Alert status for RV pacing > limit, PPM indication heart block, discontinue alert in [place] per protocol Next remote 91 days. LA    Subjective: Patient was seen and examined at bedside.  Overnight events noted.   Patient doing better and wants to be discharged.  Patient being discharged home.  Discharge Exam: Vitals:   12/06/22 0741 12/06/22 1127  BP: 107/84 (!) 105/53  Pulse: 73 70  Resp: 19 18  Temp: 98.3 F (36.8 C) 98.2 F (36.8 C)  SpO2:     Vitals:   12/06/22 0347 12/06/22 0348 12/06/22 0741 12/06/22 1127  BP:  (!) 140/96 107/84 (!) 105/53  Pulse:  79 73 70  Resp:  20 19 18   Temp: 97.7 F (36.5 C) 97.7 F (36.5 C) 98.3 F (36.8 C) 98.2 F (36.8 C)  TempSrc: Oral Oral Oral Oral  SpO2:  94%    Weight: 97.3 kg     Height:        General: Pt is alert, awake, not in acute distress Cardiovascular: RRR, S1/S2 +, no rubs, no gallops Respiratory: CTA bilaterally, no wheezing, no rhonchi Abdominal: Soft, NT, ND, bowel sounds + Extremities: no edema, no cyanosis    The results of significant diagnostics from this hospitalization (including imaging, microbiology, ancillary and laboratory) are listed below for reference.     Microbiology: Recent Results (from the past  240 hour(s))  Urine Culture     Status: Abnormal (Preliminary result)   Collection Time: 12/05/22  3:03 AM   Specimen: Urine, Clean Catch  Result Value Ref Range Status   Specimen Description   Final    URINE, CLEAN CATCH Performed at [facility], [address] [place], [place] [address]    Special Requests   Final    NONE Performed at [facility], [address] [place], [place] [address]    Culture >=100,000 COLONIES/mL ESCHERICHIA COLI (A)  Final   Report Status PENDING  Incomplete  MRSA Next Gen by PCR, Nasal     Status: None   Collection Time: 12/05/22 11:33 AM   Specimen: Nasal Mucosa; Nasal Swab  Result Value Ref Range Status   MRSA by PCR Next Gen NOT DETECTED NOT DETECTED Final    Comment: (NOTE) The GeneXpert MRSA Assay (FDA approved for NASAL specimens only), is one component of a comprehensive MRSA colonization surveillance program. It is not intended to diagnose MRSA infection nor to guide or monitor treatment for MRSA infections. Test performance is not FDA approved in patients less than 6 years old. Performed at [facility], [address], [place], [place] [address]      Labs: BNP (last 3 results) Recent Labs    12/05/22 1310  BNP 134.5*   Basic Metabolic Panel: Recent Labs  Lab 12/04/22 2243 12/06/22 0028  NA 134* 136  K 4.2 4.3  CL 99 97*  CO2 26 28  GLUCOSE 124* 160*  BUN 27* 25*  CREATININE 1.48* 1.53*  CALCIUM 8.8* 8.9  MG 1.8  --    Liver Function Tests: Recent Labs  Lab 12/04/22 2243  AST 15  ALT 8  ALKPHOS 98  BILITOT 0.6  PROT 7.6  ALBUMIN 3.8   No results for input(s): "LIPASE", "AMYLASE" in the last 168 hours. No results for input(s): "AMMONIA" in the last 168 hours. CBC: Recent Labs  Lab 12/04/22 2243 12/06/22 0028  WBC 8.3 12.4*  NEUTROABS 4.5  --   HGB 12.7 12.2  HCT 39.8 37.9  MCV 89.2 89.0  PLT 332 291   Cardiac Enzymes: No results for input(s): "CKTOTAL", "CKMB",  "CKMBINDEX", "TROPONINI" in the last 168 hours. BNP: Invalid input(s): "POCBNP" CBG: Recent Labs  Lab 12/05/22 1131 12/05/22 1649 12/05/22 2139 12/06/22 0620 12/06/22 1126  GLUCAP 216* 216* 138* 141* 161*   D-Dimer No results for input(s): "DDIMER" in the last 72 hours. Hgb A1c No results for input(s): "HGBA1C" in the last 72 hours. Lipid Profile No results for input(s): "CHOL", "HDL", "LDLCALC", "TRIG", "CHOLHDL", "LDLDIRECT" in the last 72 hours. Thyroid function studies Recent Labs    12/05/22 1310  TSH 32.524*   Anemia work up No results for input(s): "VITAMINB12", "FOLATE", "FERRITIN", "TIBC", "IRON", "RETICCTPCT" in the last 72 hours. Urinalysis    Component Value Date/Time   COLORURINE YELLOW 12/05/2022 0013   APPEARANCEUR CLOUDY (A) 12/05/2022 0013   LABSPEC 1.015 12/05/2022 0013   PHURINE 5.5 12/05/2022 0013   GLUCOSEU NEGATIVE 12/05/2022 0013   HGBUR TRACE (A) 12/05/2022 0013   BILIRUBINUR NEGATIVE 12/05/2022 0013   KETONESUR NEGATIVE 12/05/2022 0013   PROTEINUR NEGATIVE 12/05/2022 0013   NITRITE POSITIVE (A) 12/05/2022 0013   LEUKOCYTESUR MODERATE (A) 12/05/2022 0013   Sepsis Labs Recent Labs  Lab 12/04/22 2243 12/06/22 0028  WBC 8.3 12.4*   Microbiology Recent Results (from the past 240 hour(s))  Urine Culture     Status: Abnormal (Preliminary result)   Collection Time: 12/05/22  3:03 AM   Specimen: Urine, Clean Catch  Result Value Ref Range Status   Specimen Description   Final    URINE, CLEAN CATCH Performed at [facility] [place], [address] [place], [place] [address]    Special Requests   Final    NONE Performed at [facility] [place], [address] [place], [place] [address]    Culture >=100,000 COLONIES/mL ESCHERICHIA COLI (A)  Final   Report Status PENDING  Incomplete  MRSA Next Gen by PCR, Nasal     Status: None   Collection Time: 12/05/22 11:33 AM   Specimen: Nasal Mucosa; Nasal Swab  Result Value Ref Range Status    MRSA by PCR Next Gen NOT DETECTED NOT DETECTED Final    Comment: (NOTE) The GeneXpert MRSA Assay (FDA approved for NASAL specimens only), is one component of a comprehensive MRSA colonization surveillance program. It is not intended to diagnose MRSA infection nor to guide or monitor treatment for MRSA infections. Test performance is not FDA approved in patients less than 81 years old. Performed at [facility], [address] [place], [place] [address]      Time coordinating discharge: Over 30 minutes  SIGNED:   Willeen NiecePardeep Khatri, MD  [facility] 12/06/2022, 3:32 PM Pager   If 7PM-7AM, please contact night-coverage

## 2022-12-06 NOTE — Progress Notes (Signed)
Patient provided with verbal discharge instructions. Paper copy of discharge provided to patient. RN answered all questions. VSS at discharge. IV removed. Patient belongings sent with patient. Patient dc'd via wheelchair to private vehicle at [place]. 

## 2022-12-06 NOTE — Discharge Instructions (Signed)
Advised to follow-up with primary care physician in 1 week. Advised to follow-up with cardiology as scheduled. Advised to take metoprolol 25 mg twice daily.

## 2022-12-06 NOTE — Progress Notes (Signed)
  Transition of Care [facility]) Screening Note   Patient Details  Name: Mary [name] Date of Birth: [birth year]   Transition of [facility]) CM/SW Contact:    Harriet Masson, RN Phone Number: 12/06/2022, 8:53 AM    Transition of Care Department [facility]) has reviewed patient and no TOC needs have been identified at this time. We will continue to monitor patient advancement through interdisciplinary progression rounds. If new patient transition needs arise, please place a TOC consult.

## 2022-12-06 NOTE — Evaluation (Signed)
Occupational Therapy Evaluation and DC Summary  Patient Details Name: Mary [name] MRN: [id number] DOB: 07/22/1941 Today's Date: 12/06/2022   History of Present Illness 82 yo female admitted 4/6 with dizziness with pacemaker mediated tachycardia. PMhx:CAD, HTN, AFib on Eliquis, complete heart block s/p PPM, DM, CHF, obesity, glaucoma   Clinical Impression   Pt admitted for above diagnosis. PTA, patient was Mod I in bADLs/iADls and functional ambulation with use of SPC. Patient currently completing LBD and functional ambulation with Supervision while vital signs remain stable. Educated patient on visual scanning to decrease the risk of falls at home. Patient functioning at or near baseline. No follow-up OT recommended at this time       Recommendations for follow up therapy are one component of a multi-disciplinary discharge planning process, led by the attending physician.  Recommendations may be updated based on patient status, additional functional criteria and insurance authorization.   Assistance Recommended at Discharge PRN  Patient can return home with the following A little help with walking and/or transfers;Help with stairs or ramp for entrance;Assist for transportation;Assistance with cooking/housework    Functional Status Assessment  Patient has not had a recent decline in their functional status  Equipment Recommendations  None recommended by OT    Recommendations for Other Services       Precautions / Restrictions Precautions Precautions: Fall Precaution Comments: Watch HR Restrictions Weight Bearing Restrictions: No      Mobility Bed Mobility               General bed mobility comments: Pt rec'd and left sitting EOB    Transfers Overall transfer level: Needs assistance Equipment used: Straight cane Transfers: Sit to/from Stand Sit to Stand: Supervision                  Balance Overall balance assessment: Mild deficits observed, not formally  tested                                         ADL either performed or assessed with clinical judgement   ADL Overall ADL's : Needs assistance/impaired Eating/Feeding: Independent   Grooming: Standing;Supervision/safety   Upper Body Bathing: Standing;Supervision/ safety;Set up   Lower Body Bathing: Supervison/ safety;Sitting/lateral leans   Upper Body Dressing : Supervision/safety;Sitting   Lower Body Dressing: Supervision/safety;Sitting/lateral leans   Toilet Transfer: Supervision/safety;Ambulation Toilet Transfer Details (indicate cue type and reason): SPC used, simulated with low chair Toileting- Clothing Manipulation and Hygiene: Min guard;Sitting/lateral lean       Functional mobility during ADLs: Supervision/safety;Cane General ADL Comments: Pt ambulates short distances at baseline     Vision Baseline Vision/History: 1 Wears glasses (for reading) Patient Visual Report: No change from baseline       Perception     Praxis      Pertinent Vitals/Pain Pain Assessment Pain Assessment: No/denies pain     Hand Dominance Right   Extremity/Trunk Assessment Upper Extremity Assessment Upper Extremity Assessment: Overall WFL for tasks assessed   Lower Extremity Assessment Lower Extremity Assessment: Defer to PT evaluation   Cervical / Trunk Assessment Cervical / Trunk Assessment: Kyphotic   Communication Communication Communication: No difficulties   Cognition Arousal/Alertness: Awake/alert Behavior During Therapy: WFL for tasks assessed/performed Overall Cognitive Status: Within Functional Limits for tasks assessed  General Comments  VSS on RA. HR to 105 with ambulation, returned to 88 when sitting EOB at end of sessoin    Exercises     Shoulder Instructions      Home Living Family/patient expects to be discharged to:: Private residence Living Arrangements: Spouse/significant  other Available Help at Discharge: Family;Available 24 hours/day Type of Home: Apartment (ground floor) Home Access: Stairs to enter Entrance Stairs-Number of Steps: 1 curb to step up Entrance Stairs-Rails: None Home Layout: One level     Bathroom Shower/Tub: Chief Strategy Officer: Standard Bathroom Accessibility: No   Home Equipment: Rollator (4 wheels);Cane - single point;Shower seat;Toilet riser;Adaptive equipment;Rolling Walker (2 wheels) Chief Technology Officer that lifts) Adaptive Equipment: Reacher Additional Comments: Neuropathy at baseline. Pt reports 2 falls in the last 6 months d/t not being aware      Prior Functioning/Environment Prior Level of Function : Independent/Modified Independent;History of Falls (last six months)             Mobility Comments: Mod I using cane ADLs Comments: Mod I in bADLs, Mod I for cooking using chair to sit at stove. husband helps with some iADLs d/t wife's neuropathy        OT Problem List: Impaired balance (sitting and/or standing)      OT Treatment/Interventions:      OT Goals(Current goals can be found in the care plan section) Acute Rehab OT Goals Patient Stated Goal: To get home OT Goal Formulation: With patient Time For Goal Achievement: 12/20/22 Potential to Achieve Goals: Good  OT Frequency:      Co-evaluation              AM-PAC OT "6 Clicks" Daily Activity     Outcome Measure Help from another person eating meals?: None Help from another person taking care of personal grooming?: A Little Help from another person toileting, which includes using toliet, bedpan, or urinal?: A Little Help from another person bathing (including washing, rinsing, drying)?: A Little Help from another person to put on and taking off regular upper body clothing?: A Little Help from another person to put on and taking off regular lower body clothing?: A Little 6 Click Score: 19   End of Session Equipment Utilized During  Treatment: Gait belt Nurse Communication: Mobility status  Activity Tolerance: Patient tolerated treatment well Patient left: in bed;with family/visitor present;with call bell/phone within reach  OT Visit Diagnosis: Dizziness and giddiness (R42)                Time: 2035-5974 OT Time Calculation (min): 26 min Charges:  OT General Charges $OT Visit: 1 Visit OT Evaluation $OT Eval Moderate Complexity: 1 Mod OT Treatments $Therapeutic Activity: 8-22 mins  12/06/2022  AB, OTR/L  Acute Rehabilitation Services  Office: [phone]   Tristan Schroeder 12/06/2022, 9:12 AM

## 2022-12-06 NOTE — Plan of Care (Signed)

## 2022-12-06 NOTE — Progress Notes (Addendum)
  Patient Name: Mary [name] Date of Encounter: 12/06/2022  Primary Cardiologist: Lesleigh Noe, MD (Inactive) Electrophysiologist: Hillis Range, MD (Inactive)  Interval Summary   She is feeling good this am and anxious to get home.   Inpatient Medications    Scheduled Meds:  apixaban  5 mg Oral BID   atorvastatin  80 mg Oral Daily   buPROPion  150 mg Oral Daily   empagliflozin  10 mg Oral QAC breakfast   insulin aspart  0-9 Units Subcutaneous TID WC   insulin glargine-yfgn  15 Units Subcutaneous Daily   levothyroxine  150 mcg Oral Q0600   metoprolol tartrate  25 mg Oral BID   potassium chloride  40 mEq Oral Daily   sodium chloride flush  3 mL Intravenous Q12H   spironolactone  12.5 mg Oral Daily   Continuous Infusions:  sodium chloride     PRN Meds: sodium chloride, acetaminophen **OR** acetaminophen, HYDROcodone-acetaminophen, ondansetron **OR** ondansetron (ZOFRAN) IV, sodium chloride flush   Vital Signs    Vitals:   12/05/22 2334 12/06/22 0347 12/06/22 0348 12/06/22 0741  BP: 123/70  (!) 140/96 107/84  Pulse: 84  79 73  Resp: 20  20 19   Temp: 98.5 F (36.9 C) 97.7 F (36.5 C) 97.7 F (36.5 C) 98.3 F (36.8 C)  TempSrc: Oral Oral Oral Oral  SpO2: 93%  94%   Weight:  97.3 kg    Height:        Intake/Output Summary (Last 24 hours) at 12/06/2022 0854 Last data filed at 12/06/2022 0742 Gross per 24 hour  Intake 603 ml  Output 850 ml  Net -247 ml   Filed Weights   12/04/22 2242 12/05/22 1128 12/06/22 0347  Weight: 99.8 kg 98.6 kg 97.3 kg    Physical Exam    GEN- The patient is well appearing, alert and oriented x 3 today.   Lungs- Clear to ausculation bilaterally, normal work of breathing Cardiac- Regular rate and rhythm, no murmurs, rubs or gallops GI- soft, NT, ND, + BS Extremities- no clubbing or cyanosis. No edema  Telemetry    AV dual pacing in 80s this am. Difficult to tell underlying if NSR or AT , interrogation pending.  (personally  reviewed)  Hospital Course    Mary [name] is a 82 y.o. female admitted for dizziness, palpitations, weakness and fatigue. EP asked to see with tachycardia given recent atrial sensitivity adjustment.   Assessment & Plan    Atrial tachycardia Upper rate behavior CHB s/p Abbott DDD PPM Programmed DDI yesterday and has been in 70-80s since. AV dual pacing this am. Will interrogate again for presenting.  Continue increased toprol  A appropriately sensing s/p changes 10/2022  PAF Continue eliquis  Possible UTI Received rocephin Potentially may have been driving her tachycardia.   Will see with Dr. Ladona Ridgel and re-interrogate later this am. Suspect can go home from EP perspective after re-programmed.   For questions or updates, please contact [facility] Please consult [email or web address] for contact info under Cardiology/STEMI.  Signed, Graciella Freer, PA-C  12/06/2022, 8:54 AM   EP Attending  Patient seen and examined. On my arrival she was sleeping soundly and did not awaken to light shoulder nudge. Lungs are clear and CV with a RRR. Tele with ventricular pacing at 70/min.  A/P PPM - her device has been reprogrammed. Ok for [employer] home. Atrial tachy- she has remained in this. She may ultimately require amiodarone to control.  Sharlot Gowda Tabatha Razzano,MD

## 2022-12-06 NOTE — Progress Notes (Signed)
PT Cancellation Note  Patient Details Name: Mary [name] MRN: [id number] DOB: December 11, 1940   Cancelled Treatment:    Reason Eval/Treat Not Completed: Patient declined, no reason specified (pt and spouse report moving around with OT earlier and pt states she is moving better than her baseline without need for PT assessment at this time and denied need for evaluation. Will sign off per their request)   Jaquilla Woodroof B [facility] 12/06/2022, 11:19 AM Merryl Hacker, PT Acute Rehabilitation Services Office: [phone]

## 2022-12-07 ENCOUNTER — Telehealth: Payer: Self-pay

## 2022-12-07 LAB — URINE CULTURE: Culture: >=100000 — AB

## 2022-12-07 NOTE — Telephone Encounter (Signed)
Created in error, patient has new PCP

## 2022-12-08 ENCOUNTER — Telehealth: Payer: Self-pay | Admitting: Cardiovascular Disease

## 2022-12-08 ENCOUNTER — Ambulatory Visit: Payer: Medicare Other | Attending: Cardiovascular Disease | Admitting: Physician Assistant

## 2022-12-08 ENCOUNTER — Encounter: Payer: Self-pay | Admitting: Physician Assistant

## 2022-12-08 VITALS — BP 124/70 | HR 76 | Ht 67.0 in | Wt 213.2 lb

## 2022-12-08 DIAGNOSIS — I10 Essential (primary) hypertension: Secondary | ICD-10-CM

## 2022-12-08 DIAGNOSIS — I5032 Chronic diastolic (congestive) heart failure: Secondary | ICD-10-CM

## 2022-12-08 DIAGNOSIS — I25118 Atherosclerotic heart disease of native coronary artery with other forms of angina pectoris: Secondary | ICD-10-CM | POA: Diagnosis not present

## 2022-12-08 NOTE — Telephone Encounter (Signed)
Left message to call back. I jst noticed that pt was here for an appointment today. Pt has follow up appointment scheduled appointment 12/17/2022 at 10:20 AM with [place] then 05/11/2023 at 2:20 PM-Croitoru. Pt should keep these scheduled appointments.

## 2022-12-08 NOTE — Patient Instructions (Signed)
Medication Instructions:  No Changes *If you need a refill on your cardiac medications before your next appointment, please call your pharmacy*   Lab Work: BMET, Lipid Panel If you have labs (blood work) drawn today and your tests are completely normal, you will receive your results only by: MyChart Message (if you have MyChart) OR A paper copy in the mail If you have any lab test that is abnormal or we need to change your treatment, we will call you to review the results.   Testing/Procedures: No Testing   Follow-Up: At [facility], you and your health needs are our priority.  As part of our continuing mission to provide you with exceptional heart care, we have created designated Provider Care Teams.  These Care Teams include your primary Cardiologist (physician) and Advanced Practice Providers (APPs -  Physician Assistants and Nurse Practitioners) who all work together to provide you with the care you need, when you need it.  We recommend signing up for the patient portal called "MyChart".  Sign up information is provided on this After Visit Summary.  MyChart is used to connect with patients for Virtual Visits (Telemedicine).  Patients are able to view lab/test results, encounter notes, upcoming appointments, etc.  Non-urgent messages can be sent to your provider as well.   To learn more about what you can do with MyChart, go to [email or web address].    Your next appointment:   September 2024  Provider:   Nehemiah Massed, MD

## 2022-12-08 NOTE — Telephone Encounter (Signed)
Patient's husband is calling to get clarification on something the nurse told patient before she left the hospital over the weekend. She was told that she would need to follow up with Dr. Patrick Jupiter and there are uncertain as to where and why this would be an appt the patient would need to make. Please advise.

## 2022-12-13 ENCOUNTER — Telehealth: Payer: Self-pay

## 2022-12-13 NOTE — Telephone Encounter (Signed)
-----   Message from Marinus Maw, MD sent at 12/12/2022 10:27 AM EDT ----- No change.

## 2022-12-17 ENCOUNTER — Encounter: Payer: Self-pay | Admitting: Emergency Medicine

## 2022-12-17 ENCOUNTER — Encounter: Payer: Self-pay | Admitting: Student

## 2022-12-17 ENCOUNTER — Ambulatory Visit: Payer: Medicare Other | Attending: Student | Admitting: Student

## 2022-12-17 ENCOUNTER — Ambulatory Visit
Admission: EM | Admit: 2022-12-17 | Discharge: 2022-12-17 | Disposition: A | Discharge status: Home / Self Care | Payer: Medicare Other | Attending: Family Medicine | Admitting: Family Medicine

## 2022-12-17 VITALS — BP 118/70 | HR 73 | Ht 67.0 in | Wt 213.0 lb

## 2022-12-17 DIAGNOSIS — I5032 Chronic diastolic (congestive) heart failure: Secondary | ICD-10-CM | POA: Diagnosis not present

## 2022-12-17 DIAGNOSIS — I480 Paroxysmal atrial fibrillation: Secondary | ICD-10-CM

## 2022-12-17 DIAGNOSIS — R81 Glycosuria: Secondary | ICD-10-CM | POA: Diagnosis not present

## 2022-12-17 DIAGNOSIS — I10 Essential (primary) hypertension: Secondary | ICD-10-CM

## 2022-12-17 DIAGNOSIS — N3001 Acute cystitis with hematuria: Secondary | ICD-10-CM | POA: Diagnosis present

## 2022-12-17 DIAGNOSIS — I4719 Other supraventricular tachycardia: Secondary | ICD-10-CM

## 2022-12-17 LAB — CUP PACEART INCLINIC DEVICE CHECK
Battery Remaining Longevity: 80 mo
Battery Voltage: 3.01 V
Brady Statistic RA Percent Paced: 82 %
Brady Statistic RV Percent Paced: 99.96 %
Date Time Interrogation Session: 20240419105900
Implantable Lead Connection Status: 753985
Implantable Lead Connection Status: 753985
Implantable Lead Implant Date: 20150506
Implantable Lead Implant Date: 20150506
Implantable Lead Location: 753859
Implantable Lead Location: 753860
Implantable Pulse Generator Implant Date: 20231218
Lead Channel Impedance Value: 387.5 Ohm
Lead Channel Impedance Value: 475 Ohm
Lead Channel Pacing Threshold Amplitude: 0.75 V
Lead Channel Pacing Threshold Amplitude: 0.75 V
Lead Channel Pacing Threshold Amplitude: 0.75 V
Lead Channel Pacing Threshold Amplitude: 0.75 V
Lead Channel Pacing Threshold Pulse Width: 0.5 ms
Lead Channel Pacing Threshold Pulse Width: 0.5 ms
Lead Channel Pacing Threshold Pulse Width: 0.5 ms
Lead Channel Pacing Threshold Pulse Width: 0.5 ms
Lead Channel Sensing Intrinsic Amplitude: 0.3 mV
Lead Channel Sensing Intrinsic Amplitude: 9.2 mV
Lead Channel Setting Pacing Amplitude: 2.5 V
Lead Channel Setting Pacing Amplitude: 2.5 V
Lead Channel Setting Pacing Pulse Width: 0.5 ms
Lead Channel Setting Sensing Sensitivity: 4 mV
Pulse Gen Model: 2272
Pulse Gen Serial Number: 8128617

## 2022-12-17 LAB — POCT URINALYSIS DIP (MANUAL ENTRY)
Bilirubin, UA: NEGATIVE
Glucose, UA: >=1000 mg/dL — AB
Ketones, POC UA: NEGATIVE mg/dL
Nitrite, UA: NEGATIVE
Protein Ur, POC: NEGATIVE mg/dL
Spec Grav, UA: 1.015 (ref 1.010–1.025)
Urobilinogen, UA: 0.2 E.U./dL
pH, UA: 5.5 (ref 5.0–8.0)

## 2022-12-17 MED ORDER — SULFAMETHOXAZOLE-TRIMETHOPRIM 800-160 MG PO TABS: 1.0000 | ORAL_TABLET | Freq: Two times a day (BID) | ORAL | 0 refills | Status: AC

## 2022-12-17 NOTE — ED Provider Notes (Signed)
[facility]    CSN: [id number] Arrival date & time: 12/17/22  1355      History   Chief Complaint Chief Complaint  Patient presents with   Dysuria    HPI Mary [name] is a 82 y.o. female.   HPI 82 year old female presents with.  Patient was evaluated by her cardiologist today for PAF.  PMH significant for CAD, s/p P cardiac pacemaker in situ, diabetic peripheral neuropathy, and HTN.  Patient is currently on Apixaban and denies any unusual bleeding.  Patient reports was treated with Keflex while having UTI while hospitalized on 12/05/2022.  Past Medical History:  Diagnosis Date   Arthritis    Atrial tachycardia 04/23/2017   CAD (coronary artery disease)    non obstructive   Cardiac pacemaker in situ - St Jude 05/13/2014   Permanent pacemaker for second-degree heart block. Procedure complicated by an atrial lead dislodgment and repeat procedure, 12/2013    Carotid stenosis    Carotid US 5/16:  Bilateral ICA 1-39%; L vertebral retrograde; L BP 126/49, R BP 140/57   Chronic combined systolic and diastolic CHF (congestive heart failure)    Chronic lower back pain    Colon cancer    a. s/p chemo   Colon polyps    Diabetic peripheral neuropathy    DVT (deep venous thrombosis) 2013   "twice behind knee on left side" (06/25/2013)   Glaucoma    History of chicken pox    Hyperlipidemia    Hypertension    Hypothyroidism    Iron deficiency anemia    Multinodular goiter    Osteoporosis    PAF (paroxysmal atrial fibrillation)    Sleep apnea    a. resolved post weight loss    Type II diabetes mellitus     Patient Active Problem List   Diagnosis Date Noted   Pacemaker-mediated tachycardia 12/05/2022   Weakness 12/05/2022   Chemotherapy-induced neuropathy 09/28/2022   CKD (chronic kidney disease) stage 3, GFR 30-59 ml/min 09/28/2022   Mixed stress and urge urinary incontinence 09/28/2022   Hx of malignant neoplasm of colon 02/15/2022   Iron deficiency anemia,  unspecified 12/09/2021   B12 deficiency 07/03/2021   Right hand pain 10/05/2019   OSA (obstructive sleep apnea) 06/07/2018   COPD (chronic obstructive pulmonary disease) 05/24/2018   Chronic combined systolic (congestive) and diastolic (congestive) heart failure 05/24/2018   Osteoarthritis 05/25/2017   Atrial tachycardia 04/23/2017   Second degree AV block, Mobitz type II    Depression 08/02/2016   Tobacco abuse 08/02/2016   Right lumbar radiculitis 06/09/2016   Tear of medial meniscus of right knee, current 04/22/2016   Chronic venous insufficiency 02/21/2015   Hx of gastric bypass 02/20/2015   Cardiac pacemaker in situ - St Jude 05/13/2014   Atrioventricular block, complete 01/02/2014   Chronic anticoagulation 10/09/2013   PAF (paroxysmal atrial fibrillation) 08/29/2013   Obesity 05/05/2013   Coronary artery disease of native artery of native heart with stable angina pectoris    Diabetes mellitus with complication    Hypothyroidism    Hypertension    Hyperlipidemia associated with type 2 diabetes mellitus     Past Surgical History:  Procedure Laterality Date   CARDIAC CATHETERIZATION  2006   Never had PCI, 3 caths total. Last one in [place]   CARDIAC CATHETERIZATION N/A 02/04/2015   Procedure: Left Heart Cath and Coronary Angiography;  Surgeon: Lyn Records, MD;  Location: [facility] INVASIVE CV LAB;  Service: Cardiovascular;  Laterality: N/A;  CATARACT EXTRACTION, BILATERAL Bilateral 2013   "and put stent in my left eye for glaucoma" (06/25/2013)   CHOLECYSTECTOMY  1980's   COLECTOMY  10/2010   Tumor removal   EYE MUSCLE SURGERY Bilateral ~ 1963   "muscles too long; eyes would go out and up; tied muscles to hold my eyes straight" (06/25/2013)   LEAD REVISION  01-03-2014   atrial lead revision by Dr Johney Frame   LEAD REVISION N/A 01/03/2014   Procedure: LEAD REVISION;  Surgeon: Gardiner Rhyme, MD;  Location: [facility] CATH LAB;  Service: Cardiovascular;  Laterality: N/A;   LEFT HEART CATH AND  CORONARY ANGIOGRAPHY N/A 05/29/2018   Procedure: LEFT HEART CATH AND CORONARY ANGIOGRAPHY;  Surgeon: Lyn Records, MD;  Location: [facility] INVASIVE CV LAB;  Service: Cardiovascular;  Laterality: N/A;   PACEMAKER INSERTION  01/02/2014   STJ Assurity dual chamber pacemaker implnated by Dr Ladona Ridgel for SSS   PERMANENT PACEMAKER INSERTION N/A 01/02/2014   Procedure: PERMANENT PACEMAKER INSERTION;  Surgeon: Marinus Maw, MD;  Location: [facility] CATH LAB;  Service: Cardiovascular;  Laterality: N/A;   PORTACATH PLACEMENT Left 11/2010   PPM GENERATOR CHANGEOUT N/A 08/16/2022   Procedure: PPM GENERATOR CHANGEOUT;  Surgeon: Marinus Maw, MD;  Location: [facility] INVASIVE CV LAB;  Service: Cardiovascular;  Laterality: N/A;   ROUX-EN-Y GASTRIC BYPASS  2009   ULNAR TUNNEL RELEASE Left ~ 2008   UMBILICAL HERNIA REPAIR  1970's?    (06/25/2013)    OB History   No obstetric history on file.      Home Medications    Prior to Admission medications   Medication Sig Start Date End Date Taking? Authorizing Provider  albuterol (VENTOLIN HFA) 108 (90 Base) MCG/ACT inhaler Inhale 1-2 puffs into the lungs every 6 (six) hours as needed for wheezing or shortness of breath. 10/22/21  Yes Eustace Moore, MD  atorvastatin (LIPITOR) 80 MG tablet TAKE 1 TABLET(80 MG) BY MOUTH DAILY Patient taking differently: Take 80 mg by mouth daily. 09/17/19  Yes Myrlene Broker, MD  baclofen (LIORESAL) 10 MG tablet Take 1 tablet (10 mg total) by mouth daily as needed for muscle spasms. 02/25/22  Yes Myrlene Broker, MD  buPROPion (WELLBUTRIN XL) 150 MG 24 hr tablet TAKE 1 TABLET BY MOUTH EVERY DAY Patient taking differently: Take 150 mg by mouth daily. TAKE 1 TABLET BY MOUTH EVERY DAY 03/23/21  Yes Myrlene Broker, MD  cyanocobalamin (VITAMIN B12) 1000 MCG/ML injection INJECT INTO THE MUSCLE EVERY 30 DAYS Patient taking differently: Inject 1,000 mcg into the muscle every 30 (thirty) days. 09/08/22  Yes Myrlene Broker,  MD  ELIQUIS 5 MG TABS tablet TAKE ONE TABLET BY MOUTH TWICE A DAY Patient taking differently: Take 5 mg by mouth 2 (two) times daily. 12/18/21  Yes Lyn Records, MD  empagliflozin (JARDIANCE) 10 MG TABS tablet Take 1 tablet (10 mg total) by mouth daily before breakfast. 04/05/22  Yes Lyn Records, MD  HYDROcodone-acetaminophen (NORCO/VICODIN) 5-325 MG tablet Take 1 tablet by mouth every 6 (six) hours as needed. Patient taking differently: Take 1 tablet by mouth every 6 (six) hours as needed for moderate pain. 09/27/22  Yes Myrlene Broker, MD  insulin glargine (LANTUS) 100 UNIT/ML injection Inject 20 Units into the skin in the morning. Sliding Scale   Yes [provider]  insulin lispro (HUMALOG) 100 UNIT/ML injection Inject 4 Units into the skin as needed for high blood sugar. If blood sugar over 200.  Yes [provider]  levothyroxine (SYNTHROID) 137 MCG tablet Take 1 tablet (137 mcg total) by mouth daily. 02/18/22  Yes Myrlene Broker, MD  metoprolol tartrate (LOPRESSOR) 25 MG tablet Take 1 tablet (25 mg total) by mouth 2 (two) times daily. 12/06/22  Yes Willeen Niece, MD  nitroGLYCERIN (NITROSTAT) 0.4 MG SL tablet Place 1 tablet (0.4 mg total) under the tongue every 5 (five) minutes as needed for chest pain. 04/09/19  Yes Lyn Records, MD  Potassium Chloride ER 20 MEQ TBCR Take 2 tablets (40 mEq total) by mouth 2 (two) times daily. 10/12/22  Yes Croitoru, Mihai, MD  spironolactone (ALDACTONE) 25 MG tablet Take 0.5 tablets (12.5 mg total) by mouth daily. 10/12/22  Yes Croitoru, Mihai, MD  sulfamethoxazole-trimethoprim (BACTRIM DS) 800-160 MG tablet Take 1 tablet by mouth 2 (two) times daily for 7 days. 12/17/22 12/24/22 Yes Trevor Iha, FNP  torsemide (DEMADEX) 20 MG tablet TAKE ONE TABLET BY MOUTH TWICE A DAY 09/16/22  Yes Marinus Maw, MD  Vitamin D, Ergocalciferol, (DRISDOL) 1.25 MG (50000 UNIT) CAPS capsule TAKE 1 CAPSULE BY MOUTH EVERY 7 DAYS Patient taking  differently: Take 50,000 Units by mouth every Wednesday. 04/12/22  Yes Myrlene Broker, MD    Family History Family History  Problem Relation Age of Onset   Breast cancer Mother    Diabetes Mother    Heart disease Father    Heart attack Father    Heart attack Sister    Heart attack Brother    Bladder Cancer Brother    Prostate cancer Brother    Colon polyps Brother    Breast cancer Sister    Breast cancer Maternal Aunt        x 2 aunts   Pancreatic cancer Brother    Colon polyps Brother    Bladder Cancer Cousin    Clotting disorder Sister    Diabetes Sister        x 3   Diabetes Maternal Grandmother     Social History Social History   Tobacco Use   Smoking status: Former    Packs/day: 1.00    Years: 30.00    Additional pack years: 0.00    Total pack years: 30.00    Types: Cigarettes    Quit date: 02/2017    Years since quitting: 5.8   Smokeless tobacco: Never   Tobacco comments:    started in her [age] until 2009   Vaping Use   Vaping Use: Never used  Substance Use Topics   Alcohol use: Not Currently    Alcohol/week: 0.0 standard drinks of alcohol   Drug use: No     Allergies   Other, Adhesive [tape], Aspirin, Latex, and Nsaids   Review of Systems Review of Systems  Genitourinary:  Positive for frequency.  All other systems reviewed and are negative.    Physical Exam Triage Vital Signs ED Triage Vitals  Enc Vitals Group     BP      Pulse      Resp      Temp      Temp src      SpO2      Weight      Height      Head Circumference      Peak Flow      Pain Score      Pain Loc      Pain Edu?      Excl. in GC?    No data  found.  Updated Vital Signs BP 128/68 (BP Location: Right Arm)   Pulse 72   Temp 98.3 F (36.8 C) (Oral)   SpO2 97%     Physical Exam Vitals and nursing note reviewed.  Constitutional:      Appearance: Normal appearance. She is normal weight.  HENT:     Head: Normocephalic and atraumatic.      Mouth/Throat:     Mouth: Mucous membranes are moist.     Pharynx: Oropharynx is clear.  Eyes:     Extraocular Movements: Extraocular movements intact.     Conjunctiva/sclera: Conjunctivae normal.     Pupils: Pupils are equal, round, and reactive to light.  Cardiovascular:     Rate and Rhythm: Normal rate and regular rhythm.     Pulses: Normal pulses.     Heart sounds: Normal heart sounds. No murmur heard. Pulmonary:     Effort: Pulmonary effort is normal.     Breath sounds: Normal breath sounds. No wheezing, rhonchi or rales.  Abdominal:     Tenderness: There is no right CVA tenderness or left CVA tenderness.  Musculoskeletal:        General: Normal range of motion.     Cervical back: Normal range of motion and neck supple.  Skin:    General: Skin is warm and dry.  Neurological:     General: No focal deficit present.     Mental Status: She is alert and oriented to person, place, and time. Mental status is at baseline.      UC Treatments / Results  Labs (all labs ordered are listed, but only abnormal results are displayed) Labs Reviewed  POCT URINALYSIS DIP (MANUAL ENTRY) - Abnormal; Notable for the following components:      Result Value   Glucose, UA >=1,000 (*)    Blood, UA trace-intact (*)    Leukocytes, UA Trace (*)    All other components within normal limits  URINE CULTURE    EKG   Radiology CUP PACEART [facility] DEVICE CHECK  Result Date: 12/17/2022 Pacemaker check in clinic. Normal device function. Thresholds, sensing, impedances consistent with previous measurements. Device programmed to maximize longevity. AMS consistent with PAT/PAF as prior. On Eliquis. Device programmed at appropriate safety margins. Histogram distribution appropriate for patient activity level. Device programmed to optimize intrinsic conduction. Estimated longevity 4yr, 77mo Patient enrolled in remote follow-up. Patient education completed.   Procedures Procedures (including critical care  time)  Medications Ordered in UC Medications - No data to display  Initial Impression / Assessment and Plan / UC Course  I have reviewed the triage vital signs and the nursing notes.  Pertinent labs & imaging results that were available during my care of the patient were reviewed by me and considered in my medical decision making (see chart for details).     MDM: 1.  Acute cystitis with hematuria-UA revealed above, urine culture ordered, Rx'd Bactrim 800/160 mg twice daily x 7 days. 2. Glucosuria-Instructed patient to follow-up with her PCP in regard to glucosuria on urinalysis today.  UA reveals 1000 glucose and may need to adjust diabetic medications including insulin.  Final Clinical Impressions(s) / UC Diagnoses   Final diagnoses:  Acute cystitis with hematuria  Glucosuria     Discharge Instructions      Instructed patient to take medication directed with food to completion.  Encouraged increase daily water intake to  32 ounces per day.  Advised patient we will follow-up with urine culture results once received.  Instructed patient to follow-up with her PCP in regard to glucosuria on urinalysis today.  UA reveals 1000 glucose and may need to adjust diabetic medications including insulin.     ED Prescriptions     Medication Sig Dispense Auth. Provider   sulfamethoxazole-trimethoprim (BACTRIM DS) 800-160 MG tablet Take 1 tablet by mouth 2 (two) times daily for 7 days. 14 tablet Trevor Iha, FNP      PDMP not reviewed this encounter.   Trevor Iha, FNP 12/17/22 1456

## 2022-12-17 NOTE — Progress Notes (Signed)
  Electrophysiology Office Note:   Date:  12/17/2022  ID:  Mary [name], DOB 01/21/1941, MRN [id number]  Primary Cardiologist: Lesleigh Noe, MD (Inactive) Electrophysiologist: Dr. Johney Frame ->  Lewayne Bunting, MD   History of Present Illness:   Mary [name] is a 82 y.o. female with h/o CHB, symptomatic bradycardia, PAF atrial tach, hypothyroidism, CKD III, and DM2 seen today for post hospital follow up.    Seen by Dr. Ladona Ridgel 3/28 and was noted to be in ?AT. Unable to pace out, and Zio was placed (which showed ST vs AT and V pacing)  Admitted 4/7 with dizziness, palpitations, weakness, and fatigue and found to have UTI for which she was treated. Noted to be in AT on tele/EKG, so device adjusted to DDI 85.   Since discharge from hospital the patient reports doing well from a cardiac perspective. She has had no further tachypalpitations. She is having continued UTI symptoms and plans to go to urgent care today.  she denies chest pain, palpitations, dyspnea, PND, orthopnea, nausea, vomiting, dizziness, syncope, edema, weight gain, or early satiety.   Review of systems complete and found to be negative unless listed in HPI.   Device History: STJ dual chamber PPM implanted 2015 for Mobitz II   Studies Reviewed:    PPM Interrogation-  reviewed in detail today,  See PACEART report.  EKG is ordered today. Personal review shows AV dual pacing at 73 bpm  Risk Assessment/Calculations:             Physical Exam:   VS:  BP 118/70   Pulse 73   Ht  (1.702 m)   Wt 213 lb (96.6 kg)   SpO2 97%   BMI 33.36 kg/m    Wt Readings from Last 3 Encounters:  12/17/22 213 lb (96.6 kg)  12/08/22 213 lb 3.2 oz (96.7 kg)  12/06/22 214 lb 8.1 oz (97.3 kg)     GEN: Well nourished, well developed in no acute distress NECK: No JVD; No carotid bruits CARDIAC: Regular rate and rhythm, no murmurs, rubs, gallops RESPIRATORY:  Clear to auscultation without rales, wheezing or rhonchi  ABDOMEN: Soft,  non-tender, non-distended EXTREMITIES:  No edema; No deformity   ASSESSMENT AND PLAN:    Tachy-Brady syndrome s/p Abbott PPM  Normal PPM function See Pace Art report No changes today  Atrial tachycardia Upper rate behavior NSR today.  She has a baseline noise on both A and V leads which makes A EGM interpretation especially difficult at times. It is irregular with a variable cycle length, and given it is viewable on V lead I think It is very unlikely it is underlying fib, she also A paces with a normal threshold.  Continue metoprolol.  No change to PPM settings today.   UTI Pt to see PCP vs UC  Disposition:   Follow up with Dr. Ladona Ridgel in 3 months  Signed, Graciella Freer, PA-C

## 2022-12-17 NOTE — Patient Instructions (Signed)
Medication Instructions:  Your physician recommends that you continue on your current medications as directed. Please refer to the Current Medication list given to you today.  *If you need a refill on your cardiac medications before your next appointment, please call your pharmacy*  Lab Work: None ordered If you have labs (blood work) drawn today and your tests are completely normal, you will receive your results only by: MyChart Message (if you have MyChart) OR A paper copy in the mail If you have any lab test that is abnormal or we need to change your treatment, we will call you to review the results.  Follow-Up: At [facility], you and your health needs are our priority.  As part of our continuing mission to provide you with exceptional heart care, we have created designated Provider Care Teams.  These Care Teams include your primary Cardiologist (physician) and Advanced Practice Providers (APPs -  Physician Assistants and Nurse Practitioners) who all work together to provide you with the care you need, when you need it.  Your next appointment:   3 month(s)  Provider:   Lewayne Bunting, MD

## 2022-12-17 NOTE — Discharge Instructions (Addendum)
Instructed patient to take medication directed with food to completion.  Encouraged increase daily water intake to  32 ounces per day.  Advised patient we will follow-up with urine culture results once received.  Instructed patient to follow-up with her PCP in regard to glucosuria on urinalysis today.  UA reveals 1000 glucose and may need to adjust diabetic medications including insulin.

## 2022-12-17 NOTE — ED Triage Notes (Signed)
Patient c/o dysuria and "hot pee" x 3-4 days ago.  Patient recently treated with Keflex   bid #10 and sx's came back after treatment.

## 2022-12-18 LAB — URINE CULTURE: Culture: <10000 — AB

## 2022-12-22 ENCOUNTER — Other Ambulatory Visit: Payer: Self-pay | Admitting: Internal Medicine

## 2022-12-27 NOTE — Progress Notes (Signed)
Remote pacemaker transmission.   

## 2023-02-15 ENCOUNTER — Ambulatory Visit: Payer: Medicare Other

## 2023-02-15 DIAGNOSIS — I442 Atrioventricular block, complete: Secondary | ICD-10-CM | POA: Diagnosis not present

## 2023-02-16 LAB — CUP PACEART REMOTE DEVICE CHECK
Battery Remaining Longevity: 82 mo
Battery Remaining Percentage: 95 %
Battery Voltage: 3.01 V
Brady Statistic AP VP Percent: 89 %
Brady Statistic AP VS Percent: 1 %
Brady Statistic AS VP Percent: 11 %
Brady Statistic AS VS Percent: 1 %
Brady Statistic RA Percent Paced: 61 %
Brady Statistic RV Percent Paced: 99 %
Date Time Interrogation Session: 20240618024344
Implantable Lead Connection Status: 753985
Implantable Lead Connection Status: 753985
Implantable Lead Implant Date: 20150506
Implantable Lead Implant Date: 20150506
Implantable Lead Location: 753859
Implantable Lead Location: 753860
Implantable Pulse Generator Implant Date: 20231218
Lead Channel Impedance Value: 390 Ohm
Lead Channel Impedance Value: 480 Ohm
Lead Channel Pacing Threshold Amplitude: 0.75 V
Lead Channel Pacing Threshold Amplitude: 0.75 V
Lead Channel Pacing Threshold Pulse Width: 0.5 ms
Lead Channel Pacing Threshold Pulse Width: 0.5 ms
Lead Channel Sensing Intrinsic Amplitude: 0.4 mV
Lead Channel Sensing Intrinsic Amplitude: 4.3 mV
Lead Channel Setting Pacing Amplitude: 2.5 V
Lead Channel Setting Pacing Amplitude: 2.5 V
Lead Channel Setting Pacing Pulse Width: 0.5 ms
Lead Channel Setting Sensing Sensitivity: 4 mV
Pulse Gen Model: 2272
Pulse Gen Serial Number: 8128617

## 2023-03-09 NOTE — Progress Notes (Signed)
Remote pacemaker transmission.   

## 2023-03-10 ENCOUNTER — Ambulatory Visit: Payer: Medicare Other

## 2023-03-29 ENCOUNTER — Ambulatory Visit: Payer: Medicare Other | Attending: Internal Medicine | Admitting: Internal Medicine

## 2023-03-29 ENCOUNTER — Encounter: Payer: Self-pay | Admitting: Internal Medicine

## 2023-03-29 VITALS — BP 98/60 | HR 73 | Ht 67.0 in | Wt 207.0 lb

## 2023-03-29 DIAGNOSIS — I442 Atrioventricular block, complete: Secondary | ICD-10-CM | POA: Diagnosis not present

## 2023-03-29 LAB — CUP PACEART INCLINIC DEVICE CHECK
Battery Remaining Longevity: 75 mo
Battery Voltage: 2.99 V
Brady Statistic RA Percent Paced: 75 %
Brady Statistic RV Percent Paced: 99.97 %
Date Time Interrogation Session: 20240730122722
Implantable Lead Connection Status: 753985
Implantable Lead Connection Status: 753985
Implantable Lead Implant Date: 20150506
Implantable Lead Implant Date: 20150506
Implantable Lead Location: 753859
Implantable Lead Location: 753860
Implantable Pulse Generator Implant Date: 20231218
Lead Channel Impedance Value: 387.5 Ohm
Lead Channel Impedance Value: 437.5 Ohm
Lead Channel Pacing Threshold Amplitude: 0.75 V
Lead Channel Pacing Threshold Amplitude: 0.75 V
Lead Channel Pacing Threshold Amplitude: 1 V
Lead Channel Pacing Threshold Amplitude: 1 V
Lead Channel Pacing Threshold Pulse Width: 0.5 ms
Lead Channel Pacing Threshold Pulse Width: 0.5 ms
Lead Channel Pacing Threshold Pulse Width: 0.5 ms
Lead Channel Pacing Threshold Pulse Width: 0.5 ms
Lead Channel Sensing Intrinsic Amplitude: 0.3 mV
Lead Channel Sensing Intrinsic Amplitude: 4.3 mV
Lead Channel Setting Pacing Amplitude: 2.5 V
Lead Channel Setting Pacing Amplitude: 2.5 V
Lead Channel Setting Pacing Pulse Width: 0.5 ms
Lead Channel Setting Sensing Sensitivity: 4 mV
Pulse Gen Model: 2272
Pulse Gen Serial Number: 8128617

## 2023-03-29 NOTE — Patient Instructions (Signed)
Medication Instructions:  Your physician recommends that you continue on your current medications as directed. Please refer to the Current Medication list given to you today.  *If you need a refill on your cardiac medications before your next appointment, please call your pharmacy*  Follow-Up: At  [facility], you and your health needs are our priority.  As part of our continuing mission to provide you with exceptional heart care, we have created designated Provider Care Teams.  These Care Teams include your primary Cardiologist (physician) and Advanced Practice Providers (APPs -  Physician Assistants and Nurse Practitioners) who all work together to provide you with the care you need, when you need it.  Your next appointment:   1 year  Provider:   You may see Gregg Taylor, MD or one of the following Advanced Practice Providers on your designated Care Team:   Renee Ursuy, PA-C Michael "Andy" Tillery, PA-C Suzann Riddle, NP Brandi Ollis, NP    

## 2023-03-29 NOTE — Progress Notes (Signed)
HPI Mrs. Mary [name] returns today for followup of her CHB, s/p PPM insertion. She is a pleasant 82 yo woman with paroxysmal atrial fib and heart block s/p PPM insertion. Her device underwent change out a little over 8 months ago. Her sob is unchanged and she has fatigue. No chest pain or sob. She has not been able to lose weight. She has bad arthritis and has had joint injections with steroids.  Allergies  Allergen Reactions   Other Other (See Comments)    NO Blind Scopes with Naso Gastric tube.  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   Latex     Tape= burns   Nsaids Other (See Comments)    S/P gastric bypass-told not to take. GI bleeds with NSAIDS     Current Outpatient Medications  Medication Sig Dispense Refill   albuterol (VENTOLIN HFA) 108 (90 Base) MCG/ACT inhaler Inhale 1-2 puffs into the lungs every 6 (six) hours as needed for wheezing or shortness of breath. 18 g 0   atorvastatin (LIPITOR) 80 MG tablet TAKE 1 TABLET(80 MG) BY MOUTH DAILY (Patient taking differently: Take 80 mg by mouth daily.) 90 tablet 0   baclofen (LIORESAL) 10 MG tablet Take 1 tablet (10 mg total) by mouth daily as needed for muscle spasms. 30 each 0   buPROPion (WELLBUTRIN XL) 150 MG 24 hr tablet TAKE 1 TABLET BY MOUTH EVERY DAY (Patient taking differently: Take 150 mg by mouth daily. TAKE 1 TABLET BY MOUTH EVERY DAY) 90 tablet 1   cyanocobalamin (VITAMIN B12) 1000 MCG/ML injection INJECT INTO THE MUSCLE EVERY 30 DAYS (Patient taking differently: Inject 1,000 mcg into the muscle every 30 (thirty) days.) 1 mL 1   ELIQUIS 5 MG TABS tablet TAKE ONE TABLET BY MOUTH TWICE A DAY (Patient taking differently: Take 5 mg by mouth 2 (two) times daily.)  180 tablet 1   empagliflozin (JARDIANCE) 10 MG TABS tablet Take 1 tablet (10 mg total) by mouth daily before breakfast.     HYDROcodone-acetaminophen (NORCO/VICODIN) 5-325 MG tablet Take 1 tablet by mouth every 6 (six) hours as needed. (Patient taking differently: Take 1 tablet by mouth every 6 (six) hours as needed for moderate pain.) 30 tablet 0   insulin glargine (LANTUS) 100 UNIT/ML injection Inject 20 Units into the skin in the morning. Sliding Scale     insulin lispro (HUMALOG) 100 UNIT/ML injection Inject 4 Units into the skin as needed for high blood sugar. If blood sugar over 200.     levothyroxine (SYNTHROID) 137 MCG tablet Take 1 tablet (137 mcg total) by mouth daily. 90 tablet 3   metoprolol tartrate (LOPRESSOR) 25 MG tablet Take 1 tablet (25 mg total) by mouth 2 (two) times daily. 60 tablet 1   nitroGLYCERIN (NITROSTAT) 0.4 MG SL tablet Place 1 tablet (0.4 mg total) under the tongue every 5 (five) minutes as needed for chest pain. 25 tablet 2   Potassium Chloride ER 20 MEQ TBCR Take 2 tablets (40 mEq total) by mouth 2 (two) times daily. 360 tablet 0   spironolactone (ALDACTONE) 25 MG tablet Take 0.5 tablets (12.5 mg total) by mouth daily. 45 tablet 0  torsemide (DEMADEX) 20 MG tablet TAKE ONE TABLET BY MOUTH TWICE A DAY 60 tablet 3   Vitamin D, Ergocalciferol, (DRISDOL) 1.25 MG (50000 UNIT) CAPS capsule TAKE 1 CAPSULE BY MOUTH EVERY 7 DAYS (Patient taking differently: Take 50,000 Units by mouth every Wednesday.) 12 capsule 0   No current facility-administered medications for this visit.     Past Medical History:  Diagnosis Date   Arthritis    Atrial tachycardia 04/23/2017   CAD (coronary artery disease)    non obstructive   Cardiac pacemaker in situ - St Jude 05/13/2014   Permanent pacemaker for second-degree heart block. Procedure complicated by an atrial lead dislodgment and repeat procedure, 12/2013    Carotid stenosis    Carotid US 5/16:  Bilateral ICA 1-39%; L vertebral  retrograde; L BP 126/49, R BP 140/57   Chronic combined systolic and diastolic CHF (congestive heart failure) (HCC)    Chronic lower back pain    Colon cancer (HCC)    a. s/p chemo   Colon polyps    Diabetic peripheral neuropathy (HCC)    DVT (deep venous thrombosis) (HCC) 2013   "twice behind knee on left side" (06/25/2013)   Glaucoma    History of chicken pox    Hyperlipidemia    Hypertension    Hypothyroidism    Iron deficiency anemia    Multinodular goiter    Osteoporosis    PAF (paroxysmal atrial fibrillation) (HCC)    Sleep apnea    a. resolved post weight loss    Type II diabetes mellitus (HCC)     ROS:   All systems reviewed and negative except as noted in the HPI.   Past Surgical History:  Procedure Laterality Date   CARDIAC CATHETERIZATION  2006   Never had PCI, 3 caths total. Last one in [place]   CARDIAC CATHETERIZATION N/A 02/04/2015   Procedure: Left Heart Cath and Coronary Angiography;  Surgeon: Lyn Records, MD;  Location: [facility] INVASIVE CV LAB;  Service: Cardiovascular;  Laterality: N/A;   CATARACT EXTRACTION, BILATERAL Bilateral 2013   "and put stent in my left eye for glaucoma" (06/25/2013)   CHOLECYSTECTOMY  1980's   COLECTOMY  10/2010   Tumor removal   EYE MUSCLE SURGERY Bilateral ~ 1963   "muscles too long; eyes would go out and up; tied muscles to hold my eyes straight" (06/25/2013)   LEAD REVISION  01-03-2014   atrial lead revision by Dr Johney Frame   LEAD REVISION N/A 01/03/2014   Procedure: LEAD REVISION;  Surgeon: Gardiner Rhyme, MD;  Location: [facility] CATH LAB;  Service: Cardiovascular;  Laterality: N/A;   LEFT HEART CATH AND CORONARY ANGIOGRAPHY N/A 05/29/2018   Procedure: LEFT HEART CATH AND CORONARY ANGIOGRAPHY;  Surgeon: Lyn Records, MD;  Location: [facility] INVASIVE CV LAB;  Service: Cardiovascular;  Laterality: N/A;   PACEMAKER INSERTION  01/02/2014   STJ Assurity dual chamber pacemaker implnated by Dr Ladona Ridgel for SSS   PERMANENT PACEMAKER INSERTION N/A  01/02/2014   Procedure: PERMANENT PACEMAKER INSERTION;  Surgeon: Marinus Maw, MD;  Location: [facility] CATH LAB;  Service: Cardiovascular;  Laterality: N/A;   PORTACATH PLACEMENT Left 11/2010   PPM GENERATOR CHANGEOUT N/A 08/16/2022   Procedure: PPM GENERATOR CHANGEOUT;  Surgeon: Marinus Maw, MD;  Location: [facility] INVASIVE CV LAB;  Service: Cardiovascular;  Laterality: N/A;   ROUX-EN-Y GASTRIC BYPASS  2009   ULNAR TUNNEL RELEASE Left ~ 2008   UMBILICAL HERNIA REPAIR  1970's?    (06/25/2013)  Family History  Problem Relation Age of Onset   Breast cancer Mother    Diabetes Mother    Heart disease Father    Heart attack Father    Heart attack Sister    Heart attack Brother    Bladder Cancer Brother    Prostate cancer Brother    Colon polyps Brother    Breast cancer Sister    Breast cancer Maternal Aunt        x 2 aunts   Pancreatic cancer Brother    Colon polyps Brother    Bladder Cancer Cousin    Clotting disorder Sister    Diabetes Sister        x 3   Diabetes Maternal Grandmother      Social History   Socioeconomic History   Marital status: Married    Spouse name: Chrissie Noa   Number of children: 0   Years of education: Not on file   Highest education level: Not on file  Occupational History   Occupation: Retired    Comment: office work  Tobacco Use   Smoking status: Former    Current packs/day: 0.00    Average packs/day: 1 pack/day for 30.0 years (30.0 ttl pk-yrs)    Types: Cigarettes    Start date: 02/1987    Quit date: 02/2017    Years since quitting: 6.0   Smokeless tobacco: Never   Tobacco comments:    started in her [age] until 2009   Vaping Use   Vaping status: Never Used  Substance and Sexual Activity   Alcohol use: Not Currently    Alcohol/week: 0.0 standard drinks of alcohol   Drug use: No   Sexual activity: Not on file  Other Topics Concern   Not on file  Social History Narrative   Married to husband, Chrissie Noa   No children   Retired Chief Operating Officer to Electrical engineer   Recently moved back here from [place] [employer] [place] [employer] Strain: Low Risk  (12/01/2017)   Overall Financial Resource Strain (CARDIA)    Difficulty of Paying Living Expenses: Not very hard  Food Insecurity: No Food Insecurity (12/05/2022)   Hunger Vital Sign    Worried About Running Out of Food in the Last Year: Never true    Ran Out of Food in the Last Year: Never true  Transportation Needs: No Transportation Needs (12/05/2022)   PRAPARE - Administrator, Civil Service (Medical): No    Lack of Transportation (Non-Medical): No  Physical Activity: Inactive (12/01/2017)   Exercise Vital Sign    Days of Exercise per Week: 0 days    Minutes of Exercise per Session: 0 min  Stress: No Stress Concern Present (05/23/2019)   [employer] of Occupational Health - Occupational Stress Questionnaire    Feeling of Stress : Not at all  Social Connections: Unknown (08/25/2022)   [facility]   Social Network    Social Network: Not on file  Intimate Partner Violence: Not At Risk (12/05/2022)   Humiliation, Afraid, Rape, and Kick questionnaire    Fear of Current or Ex-Partner: No    Emotionally Abused: No    Physically Abused: No    Sexually Abused: No     There were no vitals taken for this visit.  Physical Exam:  Well appearing NAD HEENT: Unremarkable Neck:  No JVD, no thyromegally Lymphatics:  No adenopathy Back:  No  CVA tenderness Lungs:  Clear HEART:  Regular rate rhythm, no murmurs, no rubs, no clicks Abd:  soft, positive bowel sounds, no organomegally, no rebound, no guarding Ext:  2 plus pulses, no edema, no cyanosis, no clubbing Skin:  No rashes no nodules Neuro:  CN II through XII intact, motor grossly intact  DEVICE  Normal device function.  See PaceArt for details.   Assess/Plan:  CHB - she is asymptomatic s/p PPM insertion. PPM -she has some atrial  undersensing and her device has been reprogrammed to an atrial sensitivity of 0.15.  HTN -her bp is up. We will work on better control in the coming weeks. Atrial tachy - Her HR is controlled today. Her zio showed an ave HR of over 100 but she appears better at this point. We will follow.   Sharlot Gowda Keaten Mashek,MD

## 2023-04-10 ENCOUNTER — Other Ambulatory Visit: Payer: Self-pay | Admitting: Internal Medicine

## 2023-04-10 ENCOUNTER — Other Ambulatory Visit: Payer: Self-pay | Admitting: Cardiovascular Disease

## 2023-04-12 ENCOUNTER — Other Ambulatory Visit: Payer: Self-pay | Admitting: Cardiovascular Disease

## 2023-04-18 ENCOUNTER — Telehealth: Payer: Self-pay | Admitting: Cardiovascular Disease

## 2023-04-18 DIAGNOSIS — I480 Paroxysmal atrial fibrillation: Secondary | ICD-10-CM

## 2023-04-18 MED ORDER — APIXABAN 5 MG PO TABS: 5.0000 mg | ORAL_TABLET | Freq: Two times a day (BID) | ORAL | 1 refills | Status: DC

## 2023-04-18 NOTE — Telephone Encounter (Addendum)
Eliquis 5mg  refill request received. Patient is 82 years old, weight-93.9kg, Crea-1.53 on 12/06/22, Diagnosis-Afib/Atrial tachy/DVT, and last seen by Dr. Ladona Ridgel on 03/29/23.  Will inquire with pharmD regarding doseage as creatinine levels have varied and she is 82 years old.   Pavero, Cristal Deer, RPH  You17 minutes ago (11:17 AM)   Lets keep her at 5mg  BID but only a 90 day supply   Per above response from Thayer Ohm, PharmD advised to continue eliquis 5mg  bid. Will send as requested.

## 2023-04-18 NOTE — Telephone Encounter (Signed)
*  STAT* If patient is at the pharmacy, call can be transferred to refill team.   1. Which medications need to be refilled? (please list name of each medication and dose if known) ELIQUIS 5 MG TABS tablet    2. Would you like to learn more about the convenience, safety, & potential cost savings by using the [facility]?     3. Are you open to using the [facility] (Type [facility].  ).   4. Which pharmacy/location (including street and city if local pharmacy) is medication to be sent to?  [facility] [id number] - [place], [place] - [address]     5. Do they need a 30 day or 90 day supply? 90 day  Patient is out of medication

## 2023-04-22 ENCOUNTER — Telehealth: Payer: Self-pay | Admitting: Internal Medicine

## 2023-04-22 MED ORDER — METOPROLOL TARTRATE 25 MG PO TABS: 25.0000 mg | ORAL_TABLET | Freq: Two times a day (BID) | ORAL | 11 refills | Status: DC

## 2023-04-22 NOTE — Telephone Encounter (Signed)
*  STAT* If patient is at the pharmacy, call can be transferred to refill team.   1. Which medications need to be refilled? (please list name of each medication and dose if known) metoprolol tartrate (LOPRESSOR) 25 MG tablet   2. Which pharmacy/location (including street and city if local pharmacy) is medication to be sent to?  [facility] [id number] - [place], [place] - [address]    3. Do they need a 30 day or 90 day supply? 90

## 2023-05-04 ENCOUNTER — Other Ambulatory Visit: Payer: Self-pay | Admitting: Cardiovascular Disease

## 2023-05-04 ENCOUNTER — Other Ambulatory Visit: Payer: Self-pay

## 2023-05-04 DIAGNOSIS — I441 Atrioventricular block, second degree: Secondary | ICD-10-CM

## 2023-05-04 DIAGNOSIS — I5032 Chronic diastolic (congestive) heart failure: Secondary | ICD-10-CM

## 2023-05-04 DIAGNOSIS — I25118 Atherosclerotic heart disease of native coronary artery with other forms of angina pectoris: Secondary | ICD-10-CM

## 2023-05-04 MED ORDER — POTASSIUM CHLORIDE ER 20 MEQ PO TBCR
20.0000 meq | EXTENDED_RELEASE_TABLET | Freq: Two times a day (BID) | ORAL | 3 refills | Status: DC
Start: 2023-05-04 — End: 2023-07-13

## 2023-05-11 ENCOUNTER — Other Ambulatory Visit: Payer: Self-pay | Admitting: Internal Medicine

## 2023-05-11 ENCOUNTER — Ambulatory Visit: Payer: Medicare Other | Attending: Cardiovascular Disease | Admitting: Cardiovascular Disease

## 2023-05-11 ENCOUNTER — Encounter: Payer: Self-pay | Admitting: Cardiovascular Disease

## 2023-05-11 VITALS — BP 111/62 | HR 74 | Ht 67.0 in | Wt 204.8 lb

## 2023-05-11 DIAGNOSIS — E118 Type 2 diabetes mellitus with unspecified complications: Secondary | ICD-10-CM

## 2023-05-11 DIAGNOSIS — I5032 Chronic diastolic (congestive) heart failure: Secondary | ICD-10-CM

## 2023-05-11 DIAGNOSIS — E782 Mixed hyperlipidemia: Secondary | ICD-10-CM

## 2023-05-11 DIAGNOSIS — Z950 Presence of cardiac pacemaker: Secondary | ICD-10-CM

## 2023-05-11 DIAGNOSIS — I480 Paroxysmal atrial fibrillation: Secondary | ICD-10-CM

## 2023-05-11 DIAGNOSIS — I50812 Chronic right heart failure: Secondary | ICD-10-CM

## 2023-05-11 DIAGNOSIS — I2781 Cor pulmonale (chronic): Secondary | ICD-10-CM

## 2023-05-11 DIAGNOSIS — Z794 Long term (current) use of insulin: Secondary | ICD-10-CM

## 2023-05-11 DIAGNOSIS — G4733 Obstructive sleep apnea (adult) (pediatric): Secondary | ICD-10-CM

## 2023-05-11 DIAGNOSIS — E039 Hypothyroidism, unspecified: Secondary | ICD-10-CM

## 2023-05-11 DIAGNOSIS — I2721 Secondary pulmonary arterial hypertension: Secondary | ICD-10-CM | POA: Diagnosis not present

## 2023-05-11 DIAGNOSIS — I4719 Other supraventricular tachycardia: Secondary | ICD-10-CM

## 2023-05-11 DIAGNOSIS — I442 Atrioventricular block, complete: Secondary | ICD-10-CM

## 2023-05-11 MED ORDER — TORSEMIDE 20 MG PO TABS: ORAL_TABLET | ORAL | 11 refills | Status: DC

## 2023-05-11 NOTE — Progress Notes (Signed)
Cardiology Office Note:    Date:  05/14/2023   ID:  Mary [name], DOB 1940/12/25, MRN [id number]  PCP:  Cleophus Molt   [facility] Providers Cardiologist:  Lesleigh Noe, MD (Inactive) Electrophysiologist:  Lewayne Bunting, MD     Referring MD: Laroy Apple, [place]   Chief Complaint  Patient presents with   Edema    History of Present Illness:    Mary [name] is a 82 y.o. 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 Corky Sing retirement.  Her husband Mary [name]) is also my patient.  A couple of weeks ago she required evaluation in the emergency room at [facility] for peripheral edema.  An ultrasound showed no evidence of DVT.  Chest x-ray did not show findings of CHF.  Her BNP was very minimally elevated at 179, her creatinine was 1.23 and potassium was normal at 4.1.  She was given a single dose of intravenous furosemide and was not hospitalized.  Her swelling has since improved.  Her medications include Jardiance, torsemide 20 mg twice daily, spironolactone 12.5 mg daily, metoprolol 25 mg twice daily.  She has not had orthopnea, PND or any chest pain at rest or with activity.  She denies palpitations and seems to be unaware of the atrial fibrillation when it does occur.  She has not experienced syncope.  She is very sedentary.  Her current pacemaker download shows an 8% burden of atrial fibrillation,  almost all of it occurring in May-June of this year.  She has not had any episodes of high ventricular rates.  The device is programmed with a max track rate of 95 bpm to avoid tracking atrial tachycardia (sustained at 150 bpm in May-June).  There have been no recent meaningful episodes of atrial fibrillation since early June.  She is compliant with anticoagulation with Eliquis and denies any bleeding problems.  Glycemic control is mediocre with the most recent hemoglobin A1c of 9.9%.  As of January 2024 her LDL was 152 (this is substantially worse than years past and she reports poor compliance with statin therapy) and her HDL is okay at 50 but she has elevated triglycerides at 303.  On Jardiance and insulin.  (her endocrinologist is Dr. Sharl Ma)  She has a history of obstructive sleep apnea but has not been using her CPAP.  The device is packed away in a box ever since they moved, for at least the last year now.  She has significant complaints of fatigue.  Recent labs show no evidence of anemia (hemoglobin 13.3), but her TSH was markedly elevated at 32 in April of this year.  She reports that her dose of levothyroxine has since been adjusted.  She notes that she is taking levothyroxine in the morning, on an empty stomach.  She lives independently with her husband.  She feels very limited physically and cannot walk far due to left knee pain.  They do not have family in the area.  She is very concerned that they will not be able to  take care of these and is interested in assisted living.  Her husband does not want to talk about it.  Past Medical History:  Diagnosis Date   Arthritis    Atrial tachycardia 04/23/2017   CAD (coronary artery disease)    non obstructive   Cardiac pacemaker in situ - St Jude 05/13/2014   Permanent pacemaker for second-degree heart block. Procedure complicated by an atrial lead dislodgment and repeat procedure, 12/2013    Carotid stenosis    Carotid US 5/16:  Bilateral ICA 1-39%;  L vertebral retrograde; L BP 126/49, R BP 140/57   Chronic combined systolic and diastolic CHF (congestive heart failure) (HCC)    Chronic lower back pain    Colon cancer (HCC)    a. s/p chemo   Colon polyps    Diabetic peripheral neuropathy (HCC)    DVT (deep venous thrombosis) (HCC) 2013   "twice behind knee on left side" (06/25/2013)   Glaucoma    History of chicken pox    Hyperlipidemia    Hypertension    Hypothyroidism    Iron deficiency anemia    Multinodular goiter    Osteoporosis    PAF (paroxysmal atrial fibrillation) (HCC)    Sleep apnea    a. resolved post weight loss    Type II diabetes mellitus (HCC)     Past Surgical History:  Procedure Laterality Date   CARDIAC CATHETERIZATION  2006   Never had PCI, 3 caths total. Last one in [place]   CARDIAC CATHETERIZATION N/A 02/04/2015   Procedure: Left Heart Cath and Coronary Angiography;  Surgeon: Lyn Records, MD;  Location: [facility] INVASIVE CV LAB;  Service: Cardiovascular;  Laterality: N/A;   CATARACT EXTRACTION, BILATERAL Bilateral 2013   "and put stent in my left eye for glaucoma" (06/25/2013)   CHOLECYSTECTOMY  1980's   COLECTOMY  10/2010   Tumor removal   EYE MUSCLE SURGERY Bilateral ~ 1963   "muscles too long; eyes would go out and up; tied muscles to hold my eyes straight" (06/25/2013)   LEAD REVISION  01-03-2014   atrial lead revision by Dr Johney Frame   LEAD REVISION N/A 01/03/2014   Procedure: LEAD REVISION;  Surgeon: Gardiner Rhyme, MD;  Location: [facility] CATH LAB;  Service: Cardiovascular;  Laterality: N/A;   LEFT HEART CATH AND CORONARY ANGIOGRAPHY N/A 05/29/2018   Procedure: LEFT HEART CATH AND CORONARY ANGIOGRAPHY;  Surgeon: Lyn Records, MD;  Location: [facility] INVASIVE CV LAB;  Service: Cardiovascular;  Laterality: N/A;   PACEMAKER INSERTION  01/02/2014   STJ Assurity dual chamber pacemaker implnated by Dr Ladona Ridgel for SSS   PERMANENT PACEMAKER INSERTION N/A 01/02/2014   Procedure: PERMANENT PACEMAKER INSERTION;  Surgeon:  Marinus Maw, MD;  Location: [facility] CATH LAB;  Service: Cardiovascular;  Laterality: N/A;   PORTACATH PLACEMENT Left 11/2010   PPM GENERATOR CHANGEOUT N/A 08/16/2022   Procedure: PPM GENERATOR CHANGEOUT;  Surgeon: Marinus Maw, MD;  Location: [facility] INVASIVE CV LAB;  Service: Cardiovascular;  Laterality: N/A;   ROUX-EN-Y GASTRIC BYPASS  2009   ULNAR TUNNEL RELEASE Left ~ 2008   UMBILICAL HERNIA REPAIR  1970's?    (06/25/2013)    Current Medications: Current Meds  Medication Sig   albuterol (VENTOLIN HFA) 108 (90 Base) MCG/ACT inhaler Inhale 1-2 puffs into the lungs every 6 (six) hours as needed for wheezing or shortness of breath.   apixaban (ELIQUIS) 5 MG TABS tablet Take 1 tablet (5 mg total) by mouth 2 (two) times daily.  atorvastatin (LIPITOR) 80 MG tablet TAKE 1 TABLET(80 MG) BY MOUTH DAILY (Patient taking differently: Take 80 mg by mouth daily.)   baclofen (LIORESAL) 10 MG tablet Take 1 tablet (10 mg total) by mouth daily as needed for muscle spasms.   buPROPion (WELLBUTRIN XL) 150 MG 24 hr tablet TAKE 1 TABLET BY MOUTH EVERY DAY (Patient taking differently: Take 150 mg by mouth daily. TAKE 1 TABLET BY MOUTH EVERY DAY)   cyanocobalamin (VITAMIN B12) 1000 MCG/ML injection INJECT INTO THE MUSCLE EVERY 30 DAYS (Patient taking differently: Inject 1,000 mcg into the muscle every 30 (thirty) days.)   empagliflozin (JARDIANCE) 10 MG TABS tablet Take 1 tablet (10 mg total) by mouth daily before breakfast.   HYDROcodone-acetaminophen (NORCO/VICODIN) 5-325 MG tablet Take 1 tablet by mouth every 6 (six) hours as needed. (Patient taking differently: Take 1 tablet by mouth every 6 (six) hours as needed for moderate pain.)   insulin glargine (LANTUS) 100 UNIT/ML injection Inject 20 Units into the skin in the morning. Sliding Scale   insulin lispro (HUMALOG) 100 UNIT/ML injection Inject 4 Units into the skin as needed for high blood sugar. If blood sugar over 200.   levothyroxine (SYNTHROID) 137 MCG  tablet Take 1 tablet (137 mcg total) by mouth daily.   metoprolol tartrate (LOPRESSOR) 25 MG tablet Take 1 tablet (25 mg total) by mouth 2 (two) times daily.   nitroGLYCERIN (NITROSTAT) 0.4 MG SL tablet Place 1 tablet (0.4 mg total) under the tongue every 5 (five) minutes as needed for chest pain.   Potassium Chloride ER 20 MEQ TBCR Take 1 tablet (20 mEq total) by mouth 2 (two) times daily.   spironolactone (ALDACTONE) 25 MG tablet TAKE 1/2 TABLET BY MOUTH DAILY **MUST KEEP APRIL APPOINTMENT FOR FURTHER REFILLS. FINAL ATTEMPT**   Vitamin D, Ergocalciferol, (DRISDOL) 1.25 MG (50000 UNIT) CAPS capsule TAKE 1 CAPSULE BY MOUTH EVERY 7 DAYS (Patient taking differently: Take 50,000 Units by mouth every Wednesday.)   [DISCONTINUED] torsemide (DEMADEX) 20 MG tablet TAKE ONE TABLET BY MOUTH TWICE A DAY     Allergies:   Other, Adhesive [tape], Aspirin, Latex, and Nsaids   Social History   Socioeconomic History   Marital status: Married    Spouse name: Chrissie Noa   Number of children: 0   Years of education: Not on file   Highest education level: Not on file  Occupational History   Occupation: Retired    Comment: office work  Tobacco Use   Smoking status: Former    Current packs/day: 0.00    Average packs/day: 1 pack/day for 30.0 years (30.0 ttl pk-yrs)    Types: Cigarettes    Start date: 02/1987    Quit date: 02/2017    Years since quitting: 6.2   Smokeless tobacco: Never   Tobacco comments:    started in her [age] until 2009   Vaping Use   Vaping status: Never Used  Substance and Sexual Activity   Alcohol use: Not Currently    Alcohol/week: 0.0 standard drinks of alcohol   Drug use: No   Sexual activity: Not on file  Other Topics Concern   Not on file  Social History Narrative   Married to husband, Chrissie Noa   No children   Retired Hospital doctor to Electrical engineer   Recently moved back here from [employer]: Low Risk  (12/01/2017)   Overall Financial Resource Strain (CARDIA)  Difficulty of Paying Living Expenses: Not very hard  Food Insecurity: No Food Insecurity (12/05/2022)   Hunger Vital Sign    Worried About Running Out of Food in the Last Year: Never true    Ran Out of Food in the Last Year: Never true  Transportation Needs: No Transportation Needs (12/05/2022)   PRAPARE - Administrator, Civil Service (Medical): No    Lack of Transportation (Non-Medical): No  Physical Activity: Inactive (12/01/2017)   Exercise Vital Sign    Days of Exercise per Week: 0 days    Minutes of Exercise per Session: 0 min  Stress: No Stress Concern Present (05/23/2019)   [employer] of Occupational Health - Occupational Stress Questionnaire    Feeling of Stress : Not at all  Social Connections: Unknown (08/25/2022)   [facility]   Social Network    Social Network: Not on file     Family History: The patient's family history includes Bladder Cancer in her brother and cousin; Breast cancer in her maternal aunt, mother, and sister; Clotting disorder in her sister; Colon polyps in her brother and brother; Diabetes in her maternal grandmother, mother, and sister; Heart attack in her brother, father, and sister; Heart disease in her father; Pancreatic cancer in her brother; Prostate cancer in her brother.  ROS:   Please see the history of present illness.     All other systems reviewed and are negative.  EKGs/Labs/Other Studies Reviewed:    The following studies were reviewed today: Brief pacemaker interrogation in the office today.  In the last month the burden of atrial fibrillation has been lower at about 8% (compared to 21% at the last remote download 03/29/2023). Event monitor 12/08/2022 shows that periods of increased atrial sensed-ventricular paced rhythm probably represent atrial tachycardia. Cardiac catheterization 2019 with minor nonobstructive  coronary disease, unchanged from the previous study in 2016 Echo 2019 - Left ventricle: Systolic function was normal. The estimated    ejection fraction was in the range of 55% to 60%. There is    hypokinesis of the apical septal myocardium.  - Pulmonary arteries: Systolic pressure was severely increased. PA    peak pressure: 74 mm Hg (S).  RV s' 9.41  cm/s  No comment made about diastolic dysfunction (was probably in atrial fibrillation at the time      Recent Labs: 12/04/2022: ALT 8; Magnesium 1.8 12/05/2022: B Natriuretic Peptide 134.5; TSH 32.524 12/06/2022: BUN 25; Creatinine, Ser 1.53; Hemoglobin 12.2; Platelets 291; Potassium 4.3; Sodium 136  Recent Lipid Panel    Component Value Date/Time   CHOL 241 (H) 09/27/2022 1528   TRIG 303.0 (H) 09/27/2022 1528   HDL 49.60 09/27/2022 1528   CHOLHDL 5 09/27/2022 1528   VLDL 60.6 (H) 09/27/2022 1528   LDLCALC 73 05/25/2018 0531   LDLDIRECT 152.0 09/27/2022 1528     Risk Assessment/Calculations:    CHA2DS2-VASc Score = 7   This indicates a 11.2% annual risk of stroke. The patient's score is based upon: CHF History: 1 HTN History: 1 Diabetes History: 1 Stroke History: 0 Vascular Disease History: 1 Age Score: 2 Gender Score: 1             Physical Exam:    VS:  BP 111/62 (BP Location: Right Arm, Patient Position: Sitting, Cuff Size: Large)   Pulse 74   Ht 5\' 7"  (1.702 m)   Wt 204 lb 12.8 oz (92.9 kg)   SpO2 96%   BMI  32.08 kg/m     Wt Readings from Last 3 Encounters:  05/11/23 204 lb 12.8 oz (92.9 kg)  03/29/23 207 lb (93.9 kg)  12/17/22 213 lb (96.6 kg)     GEN: Mildly obese, well nourished, well developed in no acute distress HEENT: Normal NECK: No JVD; No carotid bruits LYMPHATICS: No lymphadenopathy CARDIAC: RRR, no murmurs, rubs, gallops RESPIRATORY:  Clear to auscultation without rales, wheezing or rhonchi  ABDOMEN: Soft, non-tender, non-distended MUSCULOSKELETAL:  No edema; No deformity  SKIN: Warm and  dry NEUROLOGIC:  Alert and oriented x 3 PSYCHIATRIC:  Normal affect   ASSESSMENT:    1. Chronic diastolic heart failure (HCC)   2. Chronic right-sided heart failure (HCC)   3. OSA (obstructive sleep apnea)   4. PAH (pulmonary artery hypertension) (HCC)   5. Cor pulmonale, chronic (HCC)   6. PAF (paroxysmal atrial fibrillation) (HCC)   7. Paroxysmal atrial tachycardia   8. CHB (complete heart block) (HCC)   9. Cardiac pacemaker in situ   10. Mixed hyperlipidemia   11. Diabetes mellitus with complication (HCC)   12. Acquired hypothyroidism    PLAN:    In order of problems listed above:  CHF: Recent worsening edema has the hallmarks of decompensated right heart failure.  Complaint was primarily lower extremity edema without a change in the degree of dyspnea, the BNP elevation was minimal, the chest x-ray did not show signs of heart failure.  She has not used CPAP in over a year.  Previous echo shows evidence of severe pulmonic hypertension.  While this may be part related to diastolic dysfunction, it could be largely due to obesity and untreated sleep apnea.  Continue Jardiance, torsemide, spironolactone.  Edema has improved.  Reviewed importance of sodium restricted diet.  Recommend daily weights and have prescribed a weight-based regimen of loop diuretic to help avoid the need for urgent/emergency rooms visits. OSA: Strongly recommended that she find her equipment and start using it every night.  Discussed the pathophysiology of sleep apnea and the adverse effects on pulmonary circulation and heart failure. PAH/cor pulmonale: Severely elevated pulm artery pressure on last echocardiogram. AFib: High embolic risk due to comorbid conditions, on appropriate anticoagulation.  Based on recent event monitor and pacemaker downloads episodes of high ventricular related related to bursts of more organized atrial tachycardia, but rate control is adequate during atrial fibrillation.  On metoprolol and  Eliquis.  Pacemaker download shows markedly improved burden of atrial fibrillation compared with May of this year, possibly due to better compliance with medications.  No meaningful episode of atrial fibrillation since the second week in June. CHB: Has 100% ventricular pacing on the most recent pacemaker check.  Appears to now be pacemaker dependent. Pacemaker: Followed by Dr. Ladona Ridgel.  Generator change December 2023.  Max track rate was reduced to 95 bpm to avoid tracking of atrial tachycardia. HLP: Does not have clinically significant CAD or PAD, but does have scattered nonobstructive lesions in her coronaries.  Target LDL less than 70.  Most recent LDL was 152 but she was not compliant with her statin.  Recheck before the end of the year. DM: Poor glycemic control.  On Jardiance but also insulin. Hypothyroidism: Markedly increased TSH on labs earlier this year, suspect this may have been due to some degree of noncompliance with medications.  Followed by Dr. Sharl Ma.  Untreated sleep apnea and insufficiently compensated hypothyroidism can explain her fatigue.           Medication Adjustments/Labs and  Tests Ordered: Current medicines are reviewed at length with the patient today.  Concerns regarding medicines are outlined above.  No orders of the defined types were placed in this encounter.  Meds ordered this encounter  Medications   torsemide (DEMADEX) 20 MG tablet    Sig: If you weigh 200 pounds or less, take 20 mg twice a day. If you weigh over 200 pounds, take 40 mg in the morning and 20 mg in the afternoon    Dispense:  90 tablet    Refill:  11    Patient Instructions  Medication Instructions:  If you weigh 200 pounds or less, take Torsemide 20 mg twice a day. If you weigh over 200 pounds, take Torsemide 40 mg in the morning and 20 mg in the afternoon *If you need a refill on your cardiac medications before your next appointment, please call your pharmacy*  Please restart your  CPAP  Follow-Up: At [facility], you and your health needs are our priority.  As part of our continuing mission to provide you with exceptional heart care, we have created designated Provider Care Teams.  These Care Teams include your primary Cardiologist (physician) and Advanced Practice Providers (APPs -  Physician Assistants and Nurse Practitioners) who all work together to provide you with the care you need, when you need it.  We recommend signing up for the patient portal called "MyChart".  Sign up information is provided on this After Visit Summary.  MyChart is used to connect with patients for Virtual Visits (Telemedicine).  Patients are able to view lab/test results, encounter notes, upcoming appointments, etc.  Non-urgent messages can be sent to your provider as well.   To learn more about what you can do with MyChart, go to [email or web address].    Your next appointment:    Dr Lalla Brothers 3 months  Dr Royann Shivers 6 months  Cooking With Less Salt Cooking with less salt is one way to reduce the amount of salt (sodium) you get from food. Most people should have less than 2,300 milligrams (mg) of sodium each day. If you have high blood pressure (hypertension), you may need to limit your sodium to 1,500 mg each day. Follow the tips below to help reduce your sodium intake. What are tips for eating less sodium? Reading food labels  Check the food label before buying or using packaged ingredients. Always check the label for the serving size and sodium content. Choose products with less than 140 mg of sodium per serving. Check the % Daily Value column to see what percent of the daily recommended amount of sodium is in one serving of the product. Foods with 5% or less are low in sodium. Foods with 20% or more are high in sodium. Do not choose foods that have salt as one of the first three ingredients on the ingredients list. Always check how much sodium is in a product, even if the label  says "unsalted" or "no salt added." Shopping Buy sodium-free or low-sodium products. Look for these words: Low-sodium. Sodium-free. Reduced-sodium. No salt added. Unsalted. Buy fresh or frozen foods without sauces or additives. Cooking Instead of salt, use herbs, seasonings without salt, and spices. Use sodium-free baking soda. Grill, braise, or roast foods to add flavor with less salt. Do not add salt to pasta, rice, or hot cereals. Drain and rinse canned vegetables, beans, and meat before use. Do not add salt when cooking sweets and desserts. Cook with low-sodium ingredients. Meal planning The sodium in bread  can add up. Try to plan meals with other grains. These may include whole oats, quinoa, whole wheat pasta, and other whole grains that do not have sodium added to them. What foods are high in sodium? Vegetables Regular canned vegetables, except low-sodium or reduced-sodium items. Sauerkraut, pickled vegetables, and relishes. Olives. [place] fries. Onion rings. Regular canned tomato sauce and paste. Regular tomato and vegetable juice. Frozen vegetables in sauces. Grains Instant hot cereals. Bread stuffing, pancake, and biscuit mixes. Croutons. Seasoned rice or pasta mixes. Noodle soup cups. Boxed or frozen macaroni and cheese. Regular salted crackers. Self-rising flour. Rolls. Bagels. Flour tortillas and wraps. Meats and other proteins Meat or fish that is salted, canned, smoked, cured, spiced, or pickled. Precooked or cured meat, such as sausages or meat loaves. Tomasa Blase. Ham. Pepperoni. Hot dogs. Corned beef. Chipped beef. Salt pork. Jerky. Pickled herring, anchovies, and sardines. Regular canned tuna. Salted nuts. Dairy Processed cheese and cheese spreads. Hard cheeses. Cheese curds. Blue cheese. Feta cheese. String cheese. Regular cottage cheese. Buttermilk. Canned milk. The items listed above may not be a full list of foods high in sodium. Talk to a dietitian to learn more. What  foods are low in sodium? Fruits Fresh, frozen, or canned fruit with no sauce added. Fruit juice. Vegetables Fresh or frozen vegetables with no sauce added. "No salt added" canned vegetables. "No salt added" tomato sauce and paste. Low-sodium or reduced-sodium tomato and vegetable juice. Grains Noodles, pasta, quinoa, rice. Shredded or puffed wheat or puffed rice. Regular or quick oats (not instant). Low-sodium crackers. Low-sodium bread. Whole grain bread and whole grain pasta. Unsalted popcorn. Meats and other proteins Fresh or frozen whole meats, poultry that has not been injected with sodium, and fish with no sauce added. Unsalted nuts. Dried peas, beans, and lentils without added salt. Unsalted canned beans. Eggs. Unsalted nut butters. Low-sodium canned tuna or chicken. Dairy Milk. Soy milk. Yogurt. Low-sodium cheeses, such as Swiss, [address], [place], and [employer]. Sherbet or ice cream (keep to  cup per serving). Cream cheese. Fats and oils Unsalted butter or margarine. Other foods Homemade pudding. Sodium-free baking soda and baking powder. Herbs and spices. Low-sodium seasoning mixes. Beverages Coffee and tea. Carbonated beverages. The items listed above may not be a full list of foods low in sodium. Talk to a dietitian to learn more. What are some salt alternatives when cooking? Herbs, seasonings, and spices can be used instead of salt to flavor your food. Herbs should be fresh or dried. Do not choose packaged mixes. Next to the name of the herb, spice, or seasoning below are some foods you can pair it with. Herbs [place] leaves - Soups, meat and vegetable dishes, and spaghetti sauce. Basil - [employer], soups, pasta, and fish dishes. Cilantro - Meat, poultry, and vegetable dishes. Chili powder - Marinades and Mexican dishes. Chives - Salad dressings and potato dishes. Cumin - Mexican dishes, couscous, and meat dishes. Dill - Fish dishes, sauces, and salads. Fennel - Meat  and vegetable dishes, breads, and cookies. Garlic (do not use garlic salt) - [place] dishes, meat dishes, salad dressings, and sauces. Marjoram - Soups, potato dishes, and meat dishes. Oregano - Pizza and spaghetti sauce. Parsley - Salads, soups, pasta, and meat dishes. Rosemary - [place] dishes, salad dressings, soups, and red meats. Saffron - Fish dishes, pasta, and some poultry dishes. Sage - Stuffings and sauces. Tarragon - Fish and Whole Foods. Thyme - Stuffing, meat, and fish dishes. Seasonings Lemon juice - Fish dishes, poultry dishes,  vegetables, and salads. Vinegar - Salad dressings, vegetables, and fish dishes. Spices Cinnamon - Sweet dishes, such as cakes, cookies, and puddings. Cloves - Gingerbread, puddings, and marinades for meats. Curry - Vegetable dishes, fish and poultry dishes, and stir-fry dishes. Ginger - Vegetable dishes, fish dishes, and stir-fry dishes. Nutmeg - Pasta, vegetables, poultry, fish dishes, and custard. This information is not intended to replace advice given to you by your health care provider. Make sure you discuss any questions you have with your health care provider. Document Revised: 09/09/2022 Document Reviewed: 09/02/2022 Elsevier Patient Education  2024 Elsevier Inc.    Signed, Thurmon Fair, MD  05/14/2023 6:13 PM    [facility]

## 2023-05-11 NOTE — Patient Instructions (Addendum)
Medication Instructions:  If you weigh 200 pounds or less, take Torsemide 20 mg twice a day. If you weigh over 200 pounds, take Torsemide 40 mg in the morning and 20 mg in the afternoon *If you need a refill on your cardiac medications before your next appointment, please call your pharmacy*  Please restart your CPAP  Follow-Up: At [facility], you and your health needs are our priority.  As part of our continuing mission to provide you with exceptional heart care, we have created designated Provider Care Teams.  These Care Teams include your primary Cardiologist (physician) and Advanced Practice Providers (APPs -  Physician Assistants and Nurse Practitioners) who all work together to provide you with the care you need, when you need it.  We recommend signing up for the patient portal called "MyChart".  Sign up information is provided on this After Visit Summary.  MyChart is used to connect with patients for Virtual Visits (Telemedicine).  Patients are able to view lab/test results, encounter notes, upcoming appointments, etc.  Non-urgent messages can be sent to your provider as well.   To learn more about what you can do with MyChart, go to [email or web address].    Your next appointment:    [place], [place] 3 months  Dr Royann Shivers 6 months  Cooking With Less Salt Cooking with less salt is one way to reduce the amount of salt (sodium) you get from food. Most people should have less than 2,300 milligrams (mg) of sodium each day. If you have high blood pressure (hypertension), you may need to limit your sodium to 1,500 mg each day. Follow the tips below to help reduce your sodium intake. What are tips for eating less sodium? Reading food labels  Check the food label before buying or using packaged ingredients. Always check the label for the serving size and sodium content. Choose products with less than 140 mg of sodium per serving. Check the % Daily Value column to see what percent  of the daily recommended amount of sodium is in one serving of the product. Foods with 5% or less are low in sodium. Foods with 20% or more are high in sodium. Do not choose foods that have salt as one of the first three ingredients on the ingredients list. Always check how much sodium is in a product, even if the label says "unsalted" or "no salt added." Shopping Buy sodium-free or low-sodium products. Look for these words: Low-sodium. Sodium-free. Reduced-sodium. No salt added. Unsalted. Buy fresh or frozen foods without sauces or additives. Cooking Instead of salt, use herbs, seasonings without salt, and spices. Use sodium-free baking soda. Grill, braise, or roast foods to add flavor with less salt. Do not add salt to pasta, rice, or hot cereals. Drain and rinse canned vegetables, beans, and meat before use. Do not add salt when cooking sweets and desserts. Cook with low-sodium ingredients. Meal planning The sodium in bread can add up. Try to plan meals with other grains. These may include whole oats, quinoa, whole wheat pasta, and other whole grains that do not have sodium added to them. What foods are high in sodium? Vegetables Regular canned vegetables, except low-sodium or reduced-sodium items. Sauerkraut, pickled vegetables, and relishes. Olives. [place] fries. Onion rings. Regular canned tomato sauce and paste. Regular tomato and vegetable juice. Frozen vegetables in sauces. Grains Instant hot cereals. Bread stuffing, pancake, and biscuit mixes. Croutons. Seasoned rice or pasta mixes. Noodle soup cups. Boxed or frozen macaroni and cheese. Regular salted crackers.  Self-rising flour. Rolls. Bagels. Flour tortillas and wraps. Meats and other proteins Meat or fish that is salted, canned, smoked, cured, spiced, or pickled. Precooked or cured meat, such as sausages or meat loaves. Tomasa Blase. Ham. Pepperoni. Hot dogs. Corned beef. Chipped beef. Salt pork. Jerky. Pickled herring, anchovies,  and sardines. Regular canned tuna. Salted nuts. Dairy Processed cheese and cheese spreads. Hard cheeses. Cheese curds. Blue cheese. Feta cheese. String cheese. Regular cottage cheese. Buttermilk. Canned milk. The items listed above may not be a full list of foods high in sodium. Talk to a dietitian to learn more. What foods are low in sodium? Fruits Fresh, frozen, or canned fruit with no sauce added. Fruit juice. Vegetables Fresh or frozen vegetables with no sauce added. "No salt added" canned vegetables. "No salt added" tomato sauce and paste. Low-sodium or reduced-sodium tomato and vegetable juice. Grains Noodles, pasta, quinoa, rice. Shredded or puffed wheat or puffed rice. Regular or quick oats (not instant). Low-sodium crackers. Low-sodium bread. Whole grain bread and whole grain pasta. Unsalted popcorn. Meats and other proteins Fresh or frozen whole meats, poultry that has not been injected with sodium, and fish with no sauce added. Unsalted nuts. Dried peas, beans, and lentils without added salt. Unsalted canned beans. Eggs. Unsalted nut butters. Low-sodium canned tuna or chicken. Dairy Milk. Soy milk. Yogurt. Low-sodium cheeses, such as Swiss, [address], [place], and [employer]. Sherbet or ice cream (keep to  cup per serving). Cream cheese. Fats and oils Unsalted butter or margarine. Other foods Homemade pudding. Sodium-free baking soda and baking powder. Herbs and spices. Low-sodium seasoning mixes. Beverages Coffee and tea. Carbonated beverages. The items listed above may not be a full list of foods low in sodium. Talk to a dietitian to learn more. What are some salt alternatives when cooking? Herbs, seasonings, and spices can be used instead of salt to flavor your food. Herbs should be fresh or dried. Do not choose packaged mixes. Next to the name of the herb, spice, or seasoning below are some foods you can pair it with. Herbs [place] leaves - Soups, meat and vegetable dishes,  and spaghetti sauce. Basil - [employer], soups, pasta, and fish dishes. Cilantro - Meat, poultry, and vegetable dishes. Chili powder - Marinades and Mexican dishes. Chives - Salad dressings and potato dishes. Cumin - Mexican dishes, couscous, and meat dishes. Dill - Fish dishes, sauces, and salads. Fennel - Meat and vegetable dishes, breads, and cookies. Garlic (do not use garlic salt) - [place] dishes, meat dishes, salad dressings, and sauces. Marjoram - Soups, potato dishes, and meat dishes. Oregano - Pizza and spaghetti sauce. Parsley - Salads, soups, pasta, and meat dishes. Rosemary - [place] dishes, salad dressings, soups, and red meats. Saffron - Fish dishes, pasta, and some poultry dishes. Sage - Stuffings and sauces. Tarragon - Fish and Whole Foods. Thyme - Stuffing, meat, and fish dishes. Seasonings Lemon juice - Fish dishes, poultry dishes, vegetables, and salads. Vinegar - Salad dressings, vegetables, and fish dishes. Spices Cinnamon - Sweet dishes, such as cakes, cookies, and puddings. Cloves - Gingerbread, puddings, and marinades for meats. Curry - Vegetable dishes, fish and poultry dishes, and stir-fry dishes. Ginger - Vegetable dishes, fish dishes, and stir-fry dishes. Nutmeg - Pasta, vegetables, poultry, fish dishes, and custard. This information is not intended to replace advice given to you by your health care provider. Make sure you discuss any questions you have with your health care provider. Document Revised: 09/09/2022 Document Reviewed: 09/02/2022 Elsevier Patient Education  2024  [employer].

## 2023-05-16 ENCOUNTER — Telehealth: Payer: Self-pay | Admitting: Emergency Medicine

## 2023-05-16 DIAGNOSIS — I2721 Secondary pulmonary arterial hypertension: Secondary | ICD-10-CM

## 2023-05-16 NOTE — Telephone Encounter (Signed)
Croitoru, Rachelle Hora, MD  Scheryl Marten, RN  Also, could she please have an echo for pulmonary hypertension, no rush. Thanks  Informed pt of order placed for Echo and someone from scheduling will call her to get it scheduled. She verbalized understanding.

## 2023-05-17 ENCOUNTER — Ambulatory Visit: Payer: Medicare Other

## 2023-05-17 DIAGNOSIS — I442 Atrioventricular block, complete: Secondary | ICD-10-CM

## 2023-05-17 DIAGNOSIS — I5032 Chronic diastolic (congestive) heart failure: Secondary | ICD-10-CM | POA: Diagnosis not present

## 2023-05-17 LAB — CUP PACEART REMOTE DEVICE CHECK
Battery Remaining Longevity: 78 mo
Battery Remaining Percentage: 92 %
Battery Voltage: 2.99 V
Brady Statistic AP VP Percent: 93 %
Brady Statistic AP VS Percent: 1 %
Brady Statistic AS VP Percent: 6.9 %
Brady Statistic AS VS Percent: 1 %
Brady Statistic RA Percent Paced: 93 %
Brady Statistic RV Percent Paced: 99 %
Date Time Interrogation Session: 20240917020014
Implantable Lead Connection Status: 753985
Implantable Lead Connection Status: 753985
Implantable Lead Implant Date: 20150506
Implantable Lead Implant Date: 20150506
Implantable Lead Location: 753859
Implantable Lead Location: 753860
Implantable Pulse Generator Implant Date: 20231218
Lead Channel Impedance Value: 400 Ohm
Lead Channel Impedance Value: 480 Ohm
Lead Channel Pacing Threshold Amplitude: 0.75 V
Lead Channel Pacing Threshold Amplitude: 1 V
Lead Channel Pacing Threshold Pulse Width: 0.5 ms
Lead Channel Pacing Threshold Pulse Width: 0.5 ms
Lead Channel Sensing Intrinsic Amplitude: 0.3 mV
Lead Channel Sensing Intrinsic Amplitude: 4.3 mV
Lead Channel Setting Pacing Amplitude: 2.5 V
Lead Channel Setting Pacing Amplitude: 2.5 V
Lead Channel Setting Pacing Pulse Width: 0.5 ms
Lead Channel Setting Sensing Sensitivity: 4 mV
Pulse Gen Model: 2272
Pulse Gen Serial Number: 8128617

## 2023-05-27 ENCOUNTER — Ambulatory Visit (HOSPITAL_COMMUNITY): Payer: Medicare Other | Attending: Cardiovascular Disease

## 2023-05-27 DIAGNOSIS — I2721 Secondary pulmonary arterial hypertension: Secondary | ICD-10-CM | POA: Diagnosis present

## 2023-05-27 LAB — ECHOCARDIOGRAM COMPLETE
Est EF: 55
S' Lateral: 3.2 cm

## 2023-05-30 ENCOUNTER — Other Ambulatory Visit: Payer: Self-pay

## 2023-05-30 ENCOUNTER — Telehealth: Payer: Self-pay | Admitting: Cardiovascular Disease

## 2023-05-30 ENCOUNTER — Emergency Department (HOSPITAL_BASED_OUTPATIENT_CLINIC_OR_DEPARTMENT_OTHER): Payer: Medicare Other

## 2023-05-30 ENCOUNTER — Encounter (HOSPITAL_BASED_OUTPATIENT_CLINIC_OR_DEPARTMENT_OTHER): Payer: Self-pay | Admitting: Emergency Medicine

## 2023-05-30 ENCOUNTER — Emergency Department (HOSPITAL_BASED_OUTPATIENT_CLINIC_OR_DEPARTMENT_OTHER)
Admission: EM | Admit: 2023-05-30 | Discharge: 2023-05-31 | Disposition: A | Discharge status: Home / Self Care | Payer: Medicare Other | Attending: Emergency Medicine | Admitting: Emergency Medicine

## 2023-05-30 DIAGNOSIS — Z7901 Long term (current) use of anticoagulants: Secondary | ICD-10-CM | POA: Diagnosis not present

## 2023-05-30 DIAGNOSIS — Z794 Long term (current) use of insulin: Secondary | ICD-10-CM | POA: Diagnosis not present

## 2023-05-30 DIAGNOSIS — Z91040 Latex allergy status: Secondary | ICD-10-CM | POA: Diagnosis not present

## 2023-05-30 DIAGNOSIS — R519 Headache, unspecified: Secondary | ICD-10-CM | POA: Insufficient documentation

## 2023-05-30 LAB — BASIC METABOLIC PANEL
Anion gap: 13 (ref 5–15)
BUN: 21 mg/dL (ref 8–23)
CO2: 27 mmol/L (ref 22–32)
Calcium: 8.8 mg/dL — ABNORMAL LOW (ref 8.9–10.3)
Chloride: 96 mmol/L — ABNORMAL LOW (ref 98–111)
Creatinine, Ser: 1.38 mg/dL — ABNORMAL HIGH (ref 0.44–1.00)
GFR, Estimated: 38 mL/min — ABNORMAL LOW (ref >60)
Glucose, Bld: 103 mg/dL — ABNORMAL HIGH (ref 70–99)
Potassium: 3.7 mmol/L (ref 3.5–5.1)
Sodium: 136 mmol/L (ref 135–145)

## 2023-05-30 LAB — URINALYSIS, MICROSCOPIC (REFLEX): WBC, UA: >50 WBC/hpf (ref 0–5)

## 2023-05-30 LAB — URINALYSIS, ROUTINE W REFLEX MICROSCOPIC
Bilirubin Urine: NEGATIVE
Glucose, UA: >=500 mg/dL — AB
Ketones, ur: NEGATIVE mg/dL
Nitrite: NEGATIVE
Protein, ur: NEGATIVE mg/dL
Specific Gravity, Urine: 1.01 (ref 1.005–1.030)
pH: 5 (ref 5.0–8.0)

## 2023-05-30 LAB — CBC
HCT: 38.7 % (ref 36.0–46.0)
Hemoglobin: 12.4 g/dL (ref 12.0–15.0)
MCH: 28.8 pg (ref 26.0–34.0)
MCHC: 32 g/dL (ref 30.0–36.0)
MCV: 90 fL (ref 80.0–100.0)
Platelets: 329 10*3/uL (ref 150–400)
RBC: 4.3 MIL/uL (ref 3.87–5.11)
RDW: 13.5 % (ref 11.5–15.5)
WBC: 9.3 10*3/uL (ref 4.0–10.5)
nRBC: 0 % (ref 0.0–0.2)

## 2023-05-30 LAB — CBG MONITORING, ED: Glucose-Capillary: 88 mg/dL (ref 70–99)

## 2023-05-30 MED ORDER — MORPHINE SULFATE (PF) 4 MG/ML IV SOLN
4.0000 mg | Freq: Once | INTRAVENOUS | Status: AC
  Administered 2023-05-30: 4 mg via INTRAVENOUS
  Filled 2023-05-30: qty 1

## 2023-05-30 MED ORDER — SODIUM CHLORIDE 0.9 % IV BOLUS
500.0000 mL | Freq: Once | INTRAVENOUS | Status: AC
  Administered 2023-05-30: 500 mL via INTRAVENOUS

## 2023-05-30 MED ORDER — IOHEXOL 350 MG/ML SOLN
75.0000 mL | Freq: Once | INTRAVENOUS | Status: AC | PRN
  Administered 2023-05-30: 75 mL via INTRAVENOUS

## 2023-05-30 NOTE — Telephone Encounter (Signed)
Call to patient and LM to call office

## 2023-05-30 NOTE — Telephone Encounter (Signed)
Call to patient.  She states this was Friday morning at 2:30 am.  She had her echo that morning. She did not go to ED or cal.  She thought Dr C would be at echo and then realized they "have other people for that".  She has not had an episode since.  At time of episode no chest pain, no SOB. No nausea or vomiting at that time. She states just "numb and out of it and couldn't get up". She states again extreme sweating at the time.  Please advise.   Echo results given to patient at this time as well. She states Appt for sleep study for March

## 2023-05-30 NOTE — ED Notes (Signed)
ED Provider at bedside. 

## 2023-05-30 NOTE — Telephone Encounter (Signed)
  Per MyChart scheduling message:  30 approx time when felt Very unwell. Went into [employer] for water. Sat down and don't know how long but head hurting and numbness all over not feeling totally aware of surroundings. Broke out in heavy sweat. So heavy felt myself trying to wipe off sweat. No idea lgth of time but when was over was extremely tired and needed to stay seated. Hasn't happened to me before. It happened about 2:30 an on the early morn of my echocard. So told teaster.she advised write this note. Do I need appt?

## 2023-05-30 NOTE — Telephone Encounter (Signed)
Sounds like a vagal event, not sure what triggered it. Please make sure to drink plenty of water

## 2023-05-30 NOTE — ED Provider Notes (Signed)
[place] [facility] Provider Note   CSN: [id number] Arrival date & time: 05/30/23  1425     History  Chief Complaint  Patient presents with   Headache    Mary [name] is a 82 y.o. female.  HPI   82 year old female presents the emergency department with concern for headache.  Patient describes a quick onset generalized headache that started about an hour prior to arrival.  She states that she has had a headache similar to this in the past.  Typically last about an hour and then self resolves.  She has had about 3 episodes prior, most recently on the Friday in the middle of the night when she woke up and then today this afternoon.  She describes the headache as generalized, feeling like her head is balloon and may pop.  She also describes bilateral blurry vision but no vision loss.  She states that her body feels like it is floating and numb but then this self resolves.  She has no associated neck pain.  No facial droop, speech changes or focal numbness or weakness.  Patient was able to ambulate steady during this most recent episode today.  No history of migraines.  States that she is otherwise been in her usual state of health with no fever or acute infectious symptoms.  Right now she states that the headache is pretty much resolved, she has mild nausea but again denies any acute neurologic symptoms.  Vision is back to normal.  Home Medications Prior to Admission medications   Medication Sig Start Date End Date Taking? Authorizing Provider  albuterol (VENTOLIN HFA) 108 (90 Base) MCG/ACT inhaler Inhale 1-2 puffs into the lungs every 6 (six) hours as needed for wheezing or shortness of breath. 10/22/21   Eustace Moore, MD  apixaban (ELIQUIS) 5 MG TABS tablet Take 1 tablet (5 mg total) by mouth 2 (two) times daily. 04/18/23   Marinus Maw, MD  atorvastatin (LIPITOR) 80 MG tablet TAKE 1 TABLET(80 MG) BY MOUTH DAILY Patient taking differently: Take  80 mg by mouth daily. 09/17/19   Myrlene Broker, MD  baclofen (LIORESAL) 10 MG tablet Take 1 tablet (10 mg total) by mouth daily as needed for muscle spasms. 02/25/22   Myrlene Broker, MD  buPROPion (WELLBUTRIN XL) 150 MG 24 hr tablet TAKE 1 TABLET BY MOUTH EVERY DAY Patient taking differently: Take 150 mg by mouth daily. TAKE 1 TABLET BY MOUTH EVERY DAY 03/23/21   Myrlene Broker, MD  cyanocobalamin (VITAMIN B12) 1000 MCG/ML injection INJECT INTO THE MUSCLE EVERY 30 DAYS Patient taking differently: Inject 1,000 mcg into the muscle every 30 (thirty) days. 09/08/22   Myrlene Broker, MD  empagliflozin (JARDIANCE) 10 MG TABS tablet Take 1 tablet (10 mg total) by mouth daily before breakfast. 04/05/22   Lyn Records, MD  HYDROcodone-acetaminophen (NORCO/VICODIN) 5-325 MG tablet Take 1 tablet by mouth every 6 (six) hours as needed. Patient taking differently: Take 1 tablet by mouth every 6 (six) hours as needed for moderate pain. 09/27/22   Myrlene Broker, MD  insulin glargine (LANTUS) 100 UNIT/ML injection Inject 20 Units into the skin in the morning. Sliding Scale    [provider]  insulin lispro (HUMALOG) 100 UNIT/ML injection Inject 4 Units into the skin as needed for high blood sugar. If blood sugar over 200.    [provider]  levothyroxine (SYNTHROID) 137 MCG tablet Take 1 tablet (137 mcg  total) by mouth daily. 02/18/22   Myrlene Broker, MD  metoprolol tartrate (LOPRESSOR) 25 MG tablet Take 1 tablet (25 mg total) by mouth 2 (two) times daily. 04/22/23   Marinus Maw, MD  nitroGLYCERIN (NITROSTAT) 0.4 MG SL tablet Place 1 tablet (0.4 mg total) under the tongue every 5 (five) minutes as needed for chest pain. 04/09/19   Lyn Records, MD  Potassium Chloride ER 20 MEQ TBCR Take 1 tablet (20 mEq total) by mouth 2 (two) times daily. 05/04/23   Croitoru, Mihai, MD  spironolactone (ALDACTONE) 25 MG tablet TAKE 1/2 TABLET BY MOUTH DAILY **MUST KEEP  APRIL APPOINTMENT FOR FURTHER REFILLS. FINAL ATTEMPT** 04/12/23   Croitoru, Rachelle Hora, MD  torsemide (DEMADEX) 20 MG tablet If you weigh 200 pounds or less, take 20 mg twice a day. If you weigh over 200 pounds, take 40 mg in the morning and 20 mg in the afternoon 05/11/23   Croitoru, Mihai, MD  Vitamin D, Ergocalciferol, (DRISDOL) 1.25 MG (50000 UNIT) CAPS capsule TAKE 1 CAPSULE BY MOUTH EVERY 7 DAYS Patient taking differently: Take 50,000 Units by mouth every Wednesday. 04/12/22   Myrlene Broker, MD      Allergies    Other, Adhesive [tape], Aspirin, Latex, and Nsaids    Review of Systems   Review of Systems  Constitutional:  Negative for fever.  Eyes:  Positive for visual disturbance.  Respiratory:  Negative for shortness of breath.   Cardiovascular:  Negative for chest pain.  Gastrointestinal:  Negative for abdominal pain, diarrhea and vomiting.  Skin:  Negative for rash.  Neurological:  Positive for headaches. Negative for tremors, syncope, facial asymmetry, speech difficulty, weakness and numbness.    Physical Exam Updated Vital Signs BP 119/69   Pulse 74   Temp 98.1 F (36.7 C)   Resp 14   Wt 85.3 kg   SpO2 90%   BMI 29.44 kg/m  Physical Exam Vitals and nursing note reviewed.  Constitutional:      General: She is not in acute distress.    Appearance: Normal appearance.  HENT:     Head: Normocephalic.     Mouth/Throat:     Mouth: Mucous membranes are moist.  Eyes:     Extraocular Movements: Extraocular movements intact.     Pupils: Pupils are equal, round, and reactive to light.  Cardiovascular:     Rate and Rhythm: Normal rate.  Pulmonary:     Effort: Pulmonary effort is normal. No respiratory distress.  Abdominal:     Palpations: Abdomen is soft.     Tenderness: There is no abdominal tenderness.  Musculoskeletal:     Cervical back: No rigidity.  Skin:    General: Skin is warm.  Neurological:     Mental Status: She is alert and oriented to person, place,  and time. Mental status is at baseline.     Cranial Nerves: No cranial nerve deficit, dysarthria or facial asymmetry.  Psychiatric:        Mood and Affect: Mood normal.     ED Results / Procedures / Treatments   Labs (all labs ordered are listed, but only abnormal results are displayed) Labs Reviewed  BASIC METABOLIC PANEL - Abnormal; Notable for the following components:      Result Value   Chloride 96 (*)    Glucose, Bld 103 (*)    Creatinine, Ser 1.38 (*)    Calcium 8.8 (*)    GFR, Estimated 38 (*)    All other  components within normal limits  URINALYSIS, ROUTINE W REFLEX MICROSCOPIC - Abnormal; Notable for the following components:   APPearance HAZY (*)    Glucose, UA >=500 (*)    Hgb urine dipstick TRACE (*)    Leukocytes,Ua LARGE (*)    All other components within normal limits  URINALYSIS, MICROSCOPIC (REFLEX) - Abnormal; Notable for the following components:   Bacteria, UA MANY (*)    All other components within normal limits  CBC  CBG MONITORING, ED    EKG EKG Interpretation Date/Time:  Monday May 30 2023 14:43:35 EDT Ventricular Rate:  71 PR Interval:    QRS Duration:  176 QT Interval:  479 QTC Calculation: 521 R Axis:   -78  Text Interpretation: Accelerated junctional rhythm Nonspecific IVCD with LAD LVH with secondary repolarization abnormality Anterolateral infarct, age indeterminate Paced rhythm Confirmed by Coralee Pesa [id number]) on 05/30/2023 3:29:49 PM  Radiology CT ANGIO HEAD NECK W WO CM  Result Date: 05/30/2023 CLINICAL DATA:  Suspected vertebral artery dissection. EXAM: CT ANGIOGRAPHY HEAD AND NECK WITH AND WITHOUT CONTRAST TECHNIQUE: Multidetector CT imaging of the head and neck was performed using the standard protocol during bolus administration of intravenous contrast. Multiplanar CT image reconstructions and MIPs were obtained to evaluate the vascular anatomy. Carotid stenosis measurements (when applicable) are obtained utilizing NASCET  criteria, using the distal internal carotid diameter as the denominator. RADIATION DOSE REDUCTION: This exam was performed according to the departmental dose-optimization program which includes automated exposure control, adjustment of the mA and/or kV according to patient size and/or use of iterative reconstruction technique. CONTRAST:  75mL OMNIPAQUE IOHEXOL 350 MG/ML SOLN COMPARISON:  Six hundred twenty-four head CT FINDINGS: CT HEAD FINDINGS Brain: There is no mass, hemorrhage or extra-axial collection. There is generalized atrophy without lobar predilection. There is no acute or chronic infarction. There is hypoattenuation of the periventricular white matter, most commonly indicating chronic ischemic microangiopathy. Skull: The visualized skull base, calvarium and extracranial soft tissues are normal. Sinuses/Orbits: No fluid levels or advanced mucosal thickening of the visualized paranasal sinuses. No mastoid or middle ear effusion. The orbits are normal. CTA NECK FINDINGS SKELETON: There is no bony spinal canal stenosis. No lytic or blastic lesion. OTHER NECK: Normal pharynx, larynx and major salivary glands. No cervical lymphadenopathy. Unremarkable thyroid gland. UPPER CHEST: No pneumothorax or pleural effusion. No nodules or masses. Cardiomegaly with coronary artery atherosclerosis. AORTIC ARCH: There is calcific atherosclerosis of the aortic arch. Normal 3 vessel branching pattern. There is severe atherosclerosis of the proximal left subclavian artery with near occlusive stenosis. RIGHT CAROTID SYSTEM: No dissection, occlusion or aneurysm. There is mixed density atherosclerosis extending into the proximal ICA, resulting in 70% stenosis. LEFT CAROTID SYSTEM: No dissection, occlusion or aneurysm. Mild atherosclerotic calcification at the carotid bifurcation without hemodynamically significant stenosis. There is approximately 50% stenosis of the distal ICA just proximal to the skull base. VERTEBRAL ARTERIES:  Right dominant configuration.There is atherosclerotic calcification at the origin of the right vertebral artery, which is otherwise normal the skull base. The left vertebral artery V1 and proximal V2 segments are occluded. There is reconstitution at approximately the C4 level with multifocal stenosis of the distal V2 segment, which remains patent. The V3 segment is normal. CTA HEAD FINDINGS POSTERIOR CIRCULATION: --Vertebral arteries: Mild bilateral atherosclerotic calcification without high-grade stenosis. --Inferior cerebellar arteries: Normal. --Basilar artery: Normal. --Superior cerebellar arteries: Normal. --Posterior cerebral arteries (PCA): Normal. ANTERIOR CIRCULATION: --Intracranial internal carotid arteries: Atherosclerotic calcification of the internal carotid arteries at the skull base  without hemodynamically significant stenosis. --Anterior cerebral arteries (ACA): Normal. Both A1 segments are present. Patent anterior communicating artery (a-comm). --Middle cerebral arteries (MCA): Normal. VENOUS SINUSES: As permitted by contrast timing, patent. ANATOMIC VARIANTS: None Review of the MIP images confirms the above findings. IMPRESSION: 1. 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 2. 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 3. 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 4. No intracranial arterial occlusion or high-grade stenosis. Aortic Atherosclerosis (ICD10-I70.0). Electronically Signed   By: Deatra Robinson M.D.   On: 05/30/2023 20:07    Procedures Procedures    Medications Ordered in ED Medications  sodium chloride 0.9 % bolus 500 mL (0 mLs Intravenous Stopped 05/30/23 1817)  iohexol (OMNIPAQUE)  350 MG/ML injection 75 mL (75 mLs Intravenous Contrast Given 05/30/23 1640)  morphine (PF) 4 MG/ML injection 4 mg (4 mg Intravenous Given 05/30/23 1848)    ED Course/ Medical Decision Making/ A&P                                 Medical Decision Making Amount and/or Complexity of Data Reviewed Labs: ordered. Radiology: ordered.  Risk Prescription drug management.   82 year old female presents emergency department after an episode of generalized headache associated with bilateral blurred vision.  Patient describes the headache as feeling like her head was a balloon going to pop and then she fell like her whole body was floating/numb.  No other focal neurologic complaints.  No focal neurofindings on exam.  Vitals showed hypotension in triage but on my initial evaluation is normotensive without any invention.  The headache is almost completely resolved, there is mild nausea, the vision is back to normal.  Blood work is reassuring.  CT of the angio head and neck shows multiple vascular findings.  For this reason I consulted neurology.  Dr. Otis Brace reviewed the imaging.  She continues to be completely neuro intact, no symptoms of stroke.  He finds these vascular changes to be very chronic.  Without any acute findings of stroke we do not feel she warrants emergent MRI.  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  I discussed with the patient and ambulatory referral to neurology for continued outpatient follow-up.  I also discussed with her if her symptoms return with any associated neurosymptoms that she needs to report to emergency room immediately.  Patient and husband at bedside understand.  Patient at this time appears safe and stable for discharge and close outpatient follow up. Discharge plan and strict return to ED precautions discussed, patient verbalizes understanding and agreement.        Final Clinical Impression(s) / ED Diagnoses Final diagnoses:  Nonintractable  headache, unspecified chronicity pattern, unspecified headache type    Rx / DC Orders ED Discharge Orders          Ordered    Ambulatory referral to Neurology       Comments: An appointment is requested in approximately: 1 week   05/30/23 2145              Rozelle Logan, DO 05/30/23 2252

## 2023-05-30 NOTE — ED Triage Notes (Addendum)
Persistent headache x 3 days , pt had a echocardiogram on Friday , and pacemaker replaced 6 months ago . Denies chest pain or shortness of breath , no nausea or emesis . Denies Hx migraine . Alert and oriented x 4 . No neuro deficit .

## 2023-05-30 NOTE — Telephone Encounter (Signed)
Called patient again and she states since call this morning. She states she is currently wearing a monitor and sent the reading to Dr Lubertha Basque office.  She is currently on the way to the ED in [place] as she states "I'm in a terrible way".  She is alert and oriented. No other symptoms noted. She states when she had the episode her BP was 82/50  HR 73

## 2023-05-30 NOTE — Discharge Instructions (Signed)
You have been seen and discharged from the emergency department.  I have placed an ambulatory referral to neurology.  They should reach out to you within a week to schedule you an appointment.  If you do not hear from them by Friday I would give their office a call at the provided information.  If the headache returns and you have associated neurosymptoms like focal weakness, numbness, loss of speech or facial droop please report emergently to an ER for an MRI.  [facility] or [facility] have MRI available.   Follow-up with your primary provider for further evaluation and further care. Take home medications as prescribed. If you have any worsening symptoms or further concerns for your health please return to an emergency department for further evaluation.

## 2023-06-01 ENCOUNTER — Encounter: Payer: Self-pay | Admitting: Neurology

## 2023-06-01 ENCOUNTER — Ambulatory Visit (INDEPENDENT_AMBULATORY_CARE_PROVIDER_SITE_OTHER): Payer: Medicare Other | Admitting: Neurology

## 2023-06-01 VITALS — BP 128/59 | HR 74 | Ht 66.0 in | Wt 199.0 lb

## 2023-06-01 DIAGNOSIS — G441 Vascular headache, not elsewhere classified: Secondary | ICD-10-CM

## 2023-06-01 DIAGNOSIS — G459 Transient cerebral ischemic attack, unspecified: Secondary | ICD-10-CM | POA: Diagnosis not present

## 2023-06-01 DIAGNOSIS — I6502 Occlusion and stenosis of left vertebral artery: Secondary | ICD-10-CM | POA: Diagnosis not present

## 2023-06-01 DIAGNOSIS — I771 Stricture of artery: Secondary | ICD-10-CM

## 2023-06-01 MED ORDER — ASPIRIN 81 MG PO TBEC: 81.0000 mg | DELAYED_RELEASE_TABLET | Freq: Every day | ORAL | 12 refills | Status: DC

## 2023-06-01 NOTE — Progress Notes (Signed)
[facility] [address] [place]. [place] [address] [phone]       OFFICE CONSULT NOTE  Mary [name] Date of Birth:  01-12-1941 Medical Record Number:  [id number]   Referring MD: Artist Beach  Reason for Referral: Headache and vision loss  HPI: Mary [name] is a pleasant 82 year old Caucasian lady seen today for initial office consultation visit.  She is accompanied by her husband today.  History is obtained from them and review of electronic medical records and I personally reviewed pertinent available imaging films in PACS.  She has past medical history of diabetes, hypertension, hyperlipidemia, coronary artery disease, paroxysmal A-fib, status post pacemaker, arthritis, glaucoma, DVT and diabetic peripheral neuropathy.  She states she has had 4 episodes of recurrence severe headaches accompanied by blurred vision and confusion.  The first episode occurred in 2016.  She describes sudden onset of severe headaches involving her vertex along with significant blurred vision difficulty to read.  She also had trouble figuring out how to use her phone.  She had trouble thinking as well.  Episode lasted about 30 minutes or so she was seen in the ER and had a CT scan done on 01/27/2015 which showed no acute abnormality.  She did well for couple of years but had a second episode 2 years ago when she developed sudden onset of headache, confusion, difficulty seeing and inability to use her blood glucose machine.  There is no accompanying nausea vomiting light sensitivity, vertigo, focal extremity weakness numbness or speech difficulties.  This episode lasted less than 30 minutes and resolved and she did not seek medical help.  The third episode occurred 5 days ago when she woke up in the morning felt okay but when she went to the kitchen she developed a blinding headache which was quite severe and she had trouble seeing and had trouble thinking.  Also lasted less than 30 minutes and  recovered.  She denied any slurred speech, vertigo, double vision or extremity weakness or numbness.  Last episode occurred on September 30 prompting her to go to the emergency room.  She had had a thorough evaluation in the and MRI scan scan could not be done because she has a pacemaker.  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  There is 70% stenosis of proximal right ICA and 50% stenosis of distal left ICA.  Patient does have severe arthritis and gets steroid injections in her knees.  She is a fall risk and has had several falls and ER visits.  She is on long-term Eliquis for his atrial fibrillation and history of DVT.  She denies any prior history of strokes TIAs or significant neurological problems.  She has mild neuropathic symptoms in the legs but they have been stable.  She does ambulate with a cane and uses a wheelchair for long distances.  She has no prior history of migraine headaches or similar headaches.  ROS:   14 system review of systems is positive for arthritis, knee pain, difficulty walking, headache, blurred vision, confusion, disorientation, short-term memory difficulties and all other systems negative  PMH:  Past Medical History:  Diagnosis Date   Arthritis    Atrial tachycardia (HCC) 04/23/2017   CAD (coronary artery disease)    non obstructive   Cardiac pacemaker in situ - [employer] Jude 05/13/2014   Permanent pacemaker for second-degree  heart block. Procedure complicated by an atrial lead dislodgment and repeat procedure, 12/2013    Carotid stenosis    Carotid US 5/16:  Bilateral ICA 1-39%; L vertebral retrograde; L BP 126/49, R BP 140/57   Chronic combined systolic and diastolic CHF (congestive heart failure) (HCC)    Chronic lower back pain    Colon cancer (HCC)    a. s/p chemo   Colon polyps    Diabetic peripheral neuropathy (HCC)     DVT (deep venous thrombosis) (HCC) 2013   "twice behind knee on left side" (06/25/2013)   Glaucoma    History of chicken pox    Hyperlipidemia    Hypertension    Hypothyroidism    Iron deficiency anemia    Multinodular goiter    Osteoporosis    PAF (paroxysmal atrial fibrillation) (HCC)    Sleep apnea    a. resolved post weight loss    Type II diabetes mellitus (HCC)     Social History:  Social History   Socioeconomic History   Marital status: Married    Spouse name: Chrissie Noa   Number of children: 0   Years of education: Not on file   Highest education level: Not on file  Occupational History   Occupation: Retired    Comment: office work  Tobacco Use   Smoking status: Former    Current packs/day: 0.00    Average packs/day: 1 pack/day for 30.0 years (30.0 ttl pk-yrs)    Types: Cigarettes    Start date: 02/1987    Quit date: 02/2017    Years since quitting: 6.2   Smokeless tobacco: Never   Tobacco comments:    started in her [age] until 2009   Vaping Use   Vaping status: Never Used  Substance and Sexual Activity   Alcohol use: Not Currently    Alcohol/week: 0.0 standard drinks of alcohol   Drug use: No   Sexual activity: Not on file  Other Topics Concern   Not on file  Social History Narrative   Married to husband, Chrissie Noa   No children   Retired Hospital doctor to Electrical engineer   Recently moved back here from [place] [employer] Strain: Low Risk  (12/01/2017)   Overall Financial Resource Strain (CARDIA)    Difficulty of Paying Living Expenses: Not very hard  Food Insecurity: No Food Insecurity (12/05/2022)   Hunger Vital Sign    Worried About Running Out of Food in the Last Year: Never true    Ran Out of Food in the Last Year: Never true  Transportation Needs: No Transportation Needs (12/05/2022)   PRAPARE - Administrator, Civil Service (Medical): No    Lack of Transportation  (Non-Medical): No  Physical Activity: Inactive (12/01/2017)   Exercise Vital Sign    Days of Exercise per Week: 0 days    Minutes of Exercise per Session: 0 min  Stress: No Stress Concern Present (05/23/2019)   [employer] of Occupational Health - Occupational Stress Questionnaire    Feeling of Stress : Not at all  Social Connections: Unknown (08/25/2022)   [facility] from [facility]   Social Network    Social Network: Not on file  Intimate Partner Violence: Not At Risk (12/05/2022)   Humiliation, Afraid, Rape, and Kick questionnaire    Fear of Current or Ex-Partner: No    Emotionally Abused: No    Physically Abused:  No    Sexually Abused: No    Medications:   Current Outpatient Medications on File Prior to Visit  Medication Sig Dispense Refill   albuterol (VENTOLIN HFA) 108 (90 Base) MCG/ACT inhaler Inhale 1-2 puffs into the lungs every 6 (six) hours as needed for wheezing or shortness of breath. 18 g 0   apixaban (ELIQUIS) 5 MG TABS tablet Take 1 tablet (5 mg total) by mouth 2 (two) times daily. 180 tablet 1   atorvastatin (LIPITOR) 80 MG tablet TAKE 1 TABLET(80 MG) BY MOUTH DAILY (Patient taking differently: Take 80 mg by mouth daily.) 90 tablet 0   baclofen (LIORESAL) 10 MG tablet Take 1 tablet (10 mg total) by mouth daily as needed for muscle spasms. 30 each 0   buPROPion (WELLBUTRIN XL) 150 MG 24 hr tablet TAKE 1 TABLET BY MOUTH EVERY DAY (Patient taking differently: Take 150 mg by mouth daily. TAKE 1 TABLET BY MOUTH EVERY DAY) 90 tablet 1   cyanocobalamin (VITAMIN B12) 1000 MCG/ML injection INJECT INTO THE MUSCLE EVERY 30 DAYS (Patient taking differently: Inject 1,000 mcg into the muscle every 30 (thirty) days.) 1 mL 1   empagliflozin (JARDIANCE) 10 MG TABS tablet Take 1 tablet (10 mg total) by mouth daily before breakfast.     HYDROcodone-acetaminophen (NORCO/VICODIN) 5-325 MG tablet Take 1 tablet by mouth every 6 (six) hours as needed. (Patient taking  differently: Take 1 tablet by mouth every 6 (six) hours as needed for moderate pain.) 30 tablet 0   insulin glargine (LANTUS) 100 UNIT/ML injection Inject 20 Units into the skin in the morning. Sliding Scale     insulin lispro (HUMALOG) 100 UNIT/ML injection Inject 4 Units into the skin as needed for high blood sugar. If blood sugar over 200.     levothyroxine (SYNTHROID) 137 MCG tablet Take 1 tablet (137 mcg total) by mouth daily. 90 tablet 3   metoprolol tartrate (LOPRESSOR) 25 MG tablet Take 1 tablet (25 mg total) by mouth 2 (two) times daily. 60 tablet 11   nitroGLYCERIN (NITROSTAT) 0.4 MG SL tablet Place 1 tablet (0.4 mg total) under the tongue every 5 (five) minutes as needed for chest pain. 25 tablet 2   Potassium Chloride ER 20 MEQ TBCR Take 1 tablet (20 mEq total) by mouth 2 (two) times daily. 360 tablet 3   spironolactone (ALDACTONE) 25 MG tablet TAKE 1/2 TABLET BY MOUTH DAILY **MUST KEEP APRIL APPOINTMENT FOR FURTHER REFILLS. FINAL ATTEMPT** 45 tablet 3   torsemide (DEMADEX) 20 MG tablet If you weigh 200 pounds or less, take 20 mg twice a day. If you weigh over 200 pounds, take 40 mg in the morning and 20 mg in the afternoon 90 tablet 11   Vitamin D, Ergocalciferol, (DRISDOL) 1.25 MG (50000 UNIT) CAPS capsule TAKE 1 CAPSULE BY MOUTH EVERY 7 DAYS (Patient taking differently: Take 50,000 Units by mouth every Wednesday.) 12 capsule 0   No current facility-administered medications on file prior to visit.    Allergies:   Allergies  Allergen Reactions   Other Other (See Comments)    NO Blind Scopes with Naso Gastric tube.  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  Latex     Tape= burns   Nsaids Other (See Comments)    S/P  gastric bypass-told not to take. GI bleeds with NSAIDS    Physical Exam General: Mildly obese pleasant elderly Caucasian lady seated, in no evident distress Head: head normocephalic and atraumatic.   Neck: supple with no carotid or supraclavicular bruits Cardiovascular: regular rate and rhythm, no murmurs Musculoskeletal: no deformity except kyphoscoliosis Skin:  no rash/petichiae Vascular:  Normal pulses all extremities  Neurologic Exam Mental Status: Awake and fully alert. Oriented to place and time. Recent and remote memory intact. Attention span, concentration and fund of knowledge appropriate. Mood and affect appropriate.  Cranial Nerves: Fundoscopic exam reveals sharp disc margins. Pupils equal, briskly reactive to light. Extraocular movements full without nystagmus. Visual fields full to confrontation. Hearing intact. Facial sensation intact. Face, tongue, palate moves normally and symmetrically.  Motor: Normal bulk and tone. Normal strength in all tested extremity muscles. Sensory.: intact to touch , pinprick , position and vibratory sensation.  Coordination: Rapid alternating movements normal in all extremities. Finger-to-nose and heel-to-shin performed accurately bilaterally. Gait and Station: Arises from chair with  difficulty. Stance is stooped.  Uses a cane and walks with a slow cautious gait with favoring left knee.  Unable to test tandem walking Reflexes: 1+ and symmetric. Toes downgoing.   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  Vascular risk factors of diabetes, hypertension, hyperlipidemia, mild obesity, coronary artery disease and paroxysmal A-fib     PLAN:I had a long d/w patient and her husband about recurrent episodes of headaches with transient  vision loss and confusion possibly representing vertebral basilar TIAs given significant subclavian and vertebral artery stenosis,, risk for recurrent stroke/TIAs, personally independently reviewed imaging studies and stroke evaluation results and answered questions.Continue Eliquis (apixaban) daily and add aspirin 80 mg daily for secondary stroke prevention and maintain strict control of hypertension with blood pressure goal below 130/90, diabetes with hemoglobin A1c goal below 6.5% and lipids with LDL cholesterol goal below 70 mg/dL. I also advised the patient to eat a healthy diet with plenty of whole grains, cereals, fruits and vegetables, exercise regularly and maintain ideal body weight .check lipid profile, hemoglobin A1c, MRI scan of the brain with and without contrast and diagnostic catheter cerebral angiogram to look if she has stentable lesion to treat.  Followup in the future with me in 3 to 4 months or call earlier if necessary. Greater than 50% time during this 45-minute consultation visit was spent in counseling and coordination of care about her episodes of headache and vision loss and TIA and vertebral and subclavian stenosis and answering questions. Delia Heady, MD Note: This document was prepared with digital dictation and possible smart phrase technology. Any transcriptional errors that result from this process are unintentional.

## 2023-06-01 NOTE — Progress Notes (Signed)
Remote pacemaker transmission.   

## 2023-06-01 NOTE — Patient Instructions (Signed)
I had a long d/w patient and her husband about recurrent episodes of headaches with transient vision loss and confusion possibly representing vertebral basilar TIAs given significant subclavian and vertebral artery stenosis,, risk for recurrent stroke/TIAs, personally independently reviewed imaging studies and stroke evaluation results and answered questions.Continue Eliquis (apixaban) daily and add aspirin 80 mg daily for secondary stroke prevention and maintain strict control of hypertension with blood pressure goal below 130/90, diabetes with hemoglobin A1c goal below 6.5% and lipids with LDL cholesterol goal below 70 mg/dL. I also advised the patient to eat a healthy diet with plenty of whole grains, cereals, fruits and vegetables, exercise regularly and maintain ideal body weight .check lipid profile, hemoglobin A1c, MRI scan of the brain with and without contrast and diagnostic catheter cerebral angiogram to look if she has stentable lesion to treat.  Followup in the future with me in 3 to 4 months or call earlier if necessary.  Stroke Prevention Some medical conditions and behaviors can lead to a higher chance of having a stroke. You can help prevent a stroke by eating healthy, exercising, not smoking, and managing any medical conditions you have. Stroke is a leading cause of functional impairment. Primary prevention is particularly important because a majority of strokes are first-time events. Stroke changes the lives of not only those who experience a stroke but also their family and other caregivers. How can this condition affect me? A stroke is a medical emergency and should be treated right away. A stroke can lead to brain damage and can sometimes be life-threatening. If a person gets medical treatment right away, there is a better chance of surviving and recovering from a stroke. What can increase my risk? The following medical conditions may increase your risk of a stroke: Cardiovascular  disease. High blood pressure (hypertension). Diabetes. High cholesterol. Sickle cell disease. Blood clotting disorders (hypercoagulable state). Obesity. Sleep disorders (obstructive sleep apnea). Other risk factors include: Being older than age 37. Having a history of blood clots, stroke, or mini-stroke (transient ischemic attack, TIA). Genetic factors, such as race, ethnicity, or a family history of stroke. Smoking cigarettes or using other tobacco products. Taking birth control pills, especially if you also use tobacco. Heavy use of alcohol or drugs, especially cocaine and methamphetamine. Physical inactivity. What actions can I take to prevent this? Manage your health conditions High cholesterol levels. Eating a healthy diet is important for preventing high cholesterol. If cholesterol cannot be managed through diet alone, you may need to take medicines. Take any prescribed medicines to control your cholesterol as told by your health care provider. Hypertension. To reduce your risk of stroke, try to keep your blood pressure below 130/80. Eating a healthy diet and exercising regularly are important for controlling blood pressure. If these steps are not enough to manage your blood pressure, you may need to take medicines. Take any prescribed medicines to control hypertension as told by your health care provider. Ask your health care provider if you should monitor your blood pressure at home. Have your blood pressure checked every year, even if your blood pressure is normal. Blood pressure increases with age and some medical conditions. Diabetes. Eating a healthy diet and exercising regularly are important parts of managing your blood sugar (glucose). If your blood sugar cannot be managed through diet and exercise, you may need to take medicines. Take any prescribed medicines to control your diabetes as told by your health care provider. Get evaluated for obstructive sleep apnea. Talk to  your health  care provider about getting a sleep evaluation if you snore a lot or have excessive sleepiness. Make sure that any other medical conditions you have, such as atrial fibrillation or atherosclerosis, are managed. Nutrition Follow instructions from your health care provider about what to eat or drink to help manage your health condition. These instructions may include: Reducing your daily calorie intake. Limiting how much salt (sodium) you use to 1,500 milligrams (mg) each day. Using only healthy fats for cooking, such as olive oil, canola oil, or sunflower oil. Eating healthy foods. You can do this by: Choosing foods that are high in fiber, such as whole grains, and fresh fruits and vegetables. Eating at least 5 servings of fruits and vegetables a day. Try to fill one-half of your plate with fruits and vegetables at each meal. Choosing lean protein foods, such as lean cuts of meat, poultry without skin, fish, tofu, beans, and nuts. Eating low-fat dairy products. Avoiding foods that are high in sodium. This can help lower blood pressure. Avoiding foods that have saturated fat, trans fat, and cholesterol. This can help prevent high cholesterol. Avoiding processed and prepared foods. Counting your daily carbohydrate intake.  Lifestyle If you drink alcohol: Limit how much you have to: 0-1 drink a day for women who are not pregnant. 0-2 drinks a day for men. Know how much alcohol is in your drink. In the [place] one drink equals one 12 oz bottle of beer ( ), one 5 oz glass of wine ( ), or one 1 oz glass of hard liquor (44mL). Do not use any products that contain nicotine or tobacco. These products include cigarettes, chewing tobacco, and vaping devices, such as e-cigarettes. If you need help quitting, ask your health care provider. Avoid secondhand smoke. Do not use drugs. Activity  Try to stay at a healthy weight. Get at least 30 minutes of exercise on most days, such  as: Fast walking. Biking. Swimming. Medicines Take over-the-counter and prescription medicines only as told by your health care provider. Aspirin or blood thinners (antiplatelets or anticoagulants) may be recommended to reduce your risk of forming blood clots that can lead to stroke. Avoid taking birth control pills. Talk to your health care provider about the risks of taking birth control pills if: You are over 50 years old. You smoke. You get very bad headaches. You have had a blood clot. Where to find more information American Stroke Association: www.strokeassociation.org Get help right away if: You or a loved one has any symptoms of a stroke. "BE FAST" is an easy way to remember the main warning signs of a stroke: B - Balance. Signs are dizziness, sudden trouble walking, or loss of balance. E - Eyes. Signs are trouble seeing or a sudden change in vision. F - Face. Signs are sudden weakness or numbness of the face, or the face or eyelid drooping on one side. A - Arms. Signs are weakness or numbness in an arm. This happens suddenly and usually on one side of the body. S - Speech. Signs are sudden trouble speaking, slurred speech, or trouble understanding what people say. T - Time. Time to call emergency services. Write down what time symptoms started. You or a loved one has other signs of a stroke, such as: A sudden, severe headache with no known cause. Nausea or vomiting. Seizure. These symptoms may represent a serious problem that is an emergency. Do not wait to see if the symptoms will go away. Get medical help right away. Call your local  emergency services (911 in the [place] Do not drive yourself to the hospital. Summary You can help to prevent a stroke by eating healthy, exercising, not smoking, limiting alcohol intake, and managing any medical conditions you may have. Do not use any products that contain nicotine or tobacco. These include cigarettes, chewing tobacco, and vaping  devices, such as e-cigarettes. If you need help quitting, ask your health care provider. Remember "BE FAST" for warning signs of a stroke. Get help right away if you or a loved one has any of these signs. This information is not intended to replace advice given to you by your health care provider. Make sure you discuss any questions you have with your health care provider. Document Revised: 07/19/2022 Document Reviewed: 07/19/2022 Elsevier Patient Education  2024 [employer].

## 2023-06-02 ENCOUNTER — Telehealth: Payer: Self-pay | Admitting: Neurology

## 2023-06-02 LAB — HEMOGLOBIN A1C
Est. average glucose Bld gHb Est-mCnc: 206 mg/dL
Hgb A1c MFr Bld: 8.8 % — ABNORMAL HIGH (ref 4.8–5.6)

## 2023-06-02 LAB — LIPID PANEL
Chol/HDL Ratio: 3.5 ratio (ref 0.0–4.4)
Cholesterol, Total: 138 mg/dL (ref 100–199)
HDL: 40 mg/dL
LDL Chol Calc (NIH): 72 mg/dL (ref 0–99)
Triglycerides: 151 mg/dL — ABNORMAL HIGH (ref 0–149)
VLDL Cholesterol Cal: 26 mg/dL (ref 5–40)

## 2023-06-02 NOTE — Progress Notes (Signed)
Kindly inform the patient that cholesterol profile is borderline but acceptable.  Screening test for diabetes is still quite high though improved from 8 months ago.  Kindly see primary care physician with help on how t to bring this down further

## 2023-06-02 NOTE — Telephone Encounter (Signed)
[employer] medicare NPR sent to [facility] since she has a pacemaker. [phone]

## 2023-06-06 ENCOUNTER — Encounter: Payer: Self-pay | Admitting: Internal Medicine

## 2023-06-06 ENCOUNTER — Telehealth: Payer: Self-pay

## 2023-06-06 ENCOUNTER — Telehealth: Payer: Self-pay | Admitting: Cardiovascular Disease

## 2023-06-06 NOTE — Telephone Encounter (Signed)
-----   Message from Delia Heady sent at 06/02/2023  5:28 PM EDT ----- Joneen Roach inform the patient that cholesterol profile is borderline but acceptable.  Screening test for diabetes is still quite high though improved from 8 months ago.  Kindly see primary care physician with help on how t to bring this down further

## 2023-06-06 NOTE — Telephone Encounter (Signed)
I spoke with the patient and provided the results of the blood test. She is working with her PCP to lower her A1c.   She wanted to inform Dr. Pearlean Brownie she had another episode of headache, weakness, and heavy sweats yesterday. She said the episode was not as significant as before. During that time her BP was 86/52. She took her blood pressure 15 minutes later and her blood pressure was in her normal range (130/70).  She is waiting to her from a scheduler regarding surgery planned for her by Dr. Pearlean Brownie.

## 2023-06-06 NOTE — Telephone Encounter (Signed)
Pt returning nurses phone call from 9/30. Please advise

## 2023-06-06 NOTE — Telephone Encounter (Signed)
Spoke to patient and advised no calls to her since her ED visit.  She states that neurology saw her and that she will need surgery.  Advised that they can send a clearance form to our office if needs cardiology clearance.  She is argumentative about [place] calling them and her not calling them.  Advised once a clearance form is sent we will complete.  While talking the phone went out.  Unable to reach back to her as rings busy.

## 2023-06-07 ENCOUNTER — Encounter: Payer: Self-pay | Admitting: Neurology

## 2023-06-07 NOTE — Telephone Encounter (Signed)
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 Would like back to make sure before cancelling the order. Phone [phone] please ask for Shanda Bumps.

## 2023-06-08 ENCOUNTER — Telehealth: Payer: Self-pay | Admitting: Internal Medicine

## 2023-06-08 ENCOUNTER — Telehealth (HOSPITAL_COMMUNITY): Payer: Self-pay

## 2023-06-08 ENCOUNTER — Telehealth: Payer: Self-pay | Admitting: *Deleted

## 2023-06-08 NOTE — Telephone Encounter (Signed)
Returned Pts call. Pt is concerned bc she is hearing different things from different people and is just worried in general about the procedure and holding her medications. Pt wanted to speak with Dr Ladona Ridgel, I told her he would be in the office next week but that I was his nurse and would make sure I asked him about the medications. Explained to her pre-op had just gotten the paperwork and would review it. Pt stated understanding.

## 2023-06-08 NOTE — Telephone Encounter (Signed)
Patient is requesting call back from Dr. Ladona Ridgel in regards to MRI and medication management prior to procedure. She states she would really like to speak with Dr. Ladona Ridgel.

## 2023-06-08 NOTE — Telephone Encounter (Signed)
I have sent the patient a message advising of this information and that she should reach out to cardiologist for clarification on whether she could move forward with MRI if absolutely needed.  I have sent a message to Dr Corliss Skains scheduler team to determine if the patient will be scheduled for "R ANGIO EXTERNAL CAROTID SEL EXT CAROTID BILAT MOD SED"

## 2023-06-08 NOTE — Telephone Encounter (Signed)
Pre-operative Risk Assessment    Patient Name: Mary [name]  DOB: 1940-11-11 MRN: [id number]   DATE OF LAST VISIT:  05/11/23 DR. CROITORU DATE OF NEXT VISIT: 08/17/23 Juanda Crumble, [facility]  Request for Surgical Clearance    Procedure:   DIAGNOSTIC ANGIOGRAM ; VERTEBRAL OCCLUSIO, SUBCLAVIAN ARTERY STENOSIS  Date of Surgery:  Clearance 06/21/23                                 Surgeon:  DR. SANJEEV DEVESHWAR Surgeon's Group or Practice Name:  [facility] Phone number:  [phone]  Fax number:  [phone]; ATTN: ASHLEY BELL   Type of Clearance Requested:   - Medical  - Pharmacy:  Hold Apixaban (Eliquis) x 4 DOSES PRIOR   Type of Anesthesia:   MODERATE SEDATION   Additional requests/questions:    Elpidio Anis   06/08/2023, 11:09 AM

## 2023-06-08 NOTE — Telephone Encounter (Signed)
Patient with diagnosis of afib on Eliquis for anticoagulation.    Procedure:  DIAGNOSTIC ANGIOGRAM ; VERTEBRAL OCCLUSIO, SUBCLAVIAN ARTERY STENOSIS  Date of procedure: 06/21/23   CHA2DS2-VASc Score = 7   This indicates a 11.2% annual risk of stroke. The patient's score is based upon: CHF History: 1 HTN History: 1 Diabetes History: 1 Stroke History: 0 Vascular Disease History: 1 Age Score: 2 Gender Score: 1      Patient has remote hx of DVT  CrCl 36 ml/min Platelet count 329  Per office protocol, patient can hold Eliquis for 2 days prior to procedure.    **This guidance is not considered finalized until pre-operative APP has relayed final recommendations.**

## 2023-06-08 NOTE — Telephone Encounter (Signed)
Ok per Dr. Corliss Skains for diagnostic angiogram. AB

## 2023-06-09 NOTE — Telephone Encounter (Signed)
Patient Name: Mary [name]  DOB: [birth year] MRN: [id number]  Primary Cardiologist: Lesleigh Noe, MD (Inactive)  Chart reviewed as part of pre-operative protocol coverage. Given past medical history and time since last visit, based on ACC/AHA guidelines, Mary [name] is at acceptable risk for the planned procedure without further cardiovascular testing. Pt was last seen in the office on 05/11/2023 by [name] and was stable. Her activity is limited at baseline.   Per Dr. Rennis Golden, covering cardiologist for [name], "this is a low risk procedure. Would not need additional testing to proceed in my opinion."  Per office protocol, patient can hold Eliquis for 2 days prior to procedure.  Please resume Eliquis as soon as possible postprocedure, at the discretion of the surgeon.    I will route this recommendation to the requesting party via Epic fax function and remove from pre-op pool.  Please call with questions.  Joylene Grapes, NP 06/09/2023, 12:04 PM

## 2023-06-14 NOTE — Telephone Encounter (Signed)
Spoke with Pt. Told pt Dr Ladona Ridgel agrees with holding eliquis for procedure. Pt stated understanding.

## 2023-06-20 ENCOUNTER — Other Ambulatory Visit: Payer: Self-pay | Admitting: Radiology

## 2023-06-20 DIAGNOSIS — I7774 Dissection of vertebral artery: Secondary | ICD-10-CM

## 2023-06-21 ENCOUNTER — Encounter (HOSPITAL_COMMUNITY): Payer: Self-pay

## 2023-06-21 ENCOUNTER — Ambulatory Visit (HOSPITAL_COMMUNITY)
Admission: RE | Admit: 2023-06-21 | Discharge: 2023-06-21 | Disposition: A | Discharge status: Home / Self Care | Payer: Medicare Other | Source: Ambulatory Visit | Attending: Neurology | Admitting: Neurology

## 2023-06-21 ENCOUNTER — Other Ambulatory Visit: Payer: Self-pay

## 2023-06-21 ENCOUNTER — Other Ambulatory Visit: Payer: Self-pay | Admitting: Neurology

## 2023-06-21 DIAGNOSIS — I6502 Occlusion and stenosis of left vertebral artery: Secondary | ICD-10-CM | POA: Diagnosis not present

## 2023-06-21 DIAGNOSIS — Z950 Presence of cardiac pacemaker: Secondary | ICD-10-CM | POA: Diagnosis not present

## 2023-06-21 DIAGNOSIS — Z87891 Personal history of nicotine dependence: Secondary | ICD-10-CM | POA: Diagnosis not present

## 2023-06-21 DIAGNOSIS — I5042 Chronic combined systolic (congestive) and diastolic (congestive) heart failure: Secondary | ICD-10-CM | POA: Diagnosis not present

## 2023-06-21 DIAGNOSIS — I2510 Atherosclerotic heart disease of native coronary artery without angina pectoris: Secondary | ICD-10-CM | POA: Insufficient documentation

## 2023-06-21 DIAGNOSIS — Z794 Long term (current) use of insulin: Secondary | ICD-10-CM | POA: Diagnosis not present

## 2023-06-21 DIAGNOSIS — G459 Transient cerebral ischemic attack, unspecified: Secondary | ICD-10-CM | POA: Diagnosis present

## 2023-06-21 DIAGNOSIS — I4891 Unspecified atrial fibrillation: Secondary | ICD-10-CM | POA: Diagnosis not present

## 2023-06-21 DIAGNOSIS — I771 Stricture of artery: Secondary | ICD-10-CM | POA: Insufficient documentation

## 2023-06-21 DIAGNOSIS — I110 Hypertensive heart disease with heart failure: Secondary | ICD-10-CM | POA: Insufficient documentation

## 2023-06-21 DIAGNOSIS — Z7901 Long term (current) use of anticoagulants: Secondary | ICD-10-CM | POA: Insufficient documentation

## 2023-06-21 DIAGNOSIS — E1140 Type 2 diabetes mellitus with diabetic neuropathy, unspecified: Secondary | ICD-10-CM | POA: Diagnosis not present

## 2023-06-21 DIAGNOSIS — E785 Hyperlipidemia, unspecified: Secondary | ICD-10-CM | POA: Diagnosis not present

## 2023-06-21 DIAGNOSIS — I7774 Dissection of vertebral artery: Secondary | ICD-10-CM

## 2023-06-21 HISTORY — PX: IR ANGIO VERTEBRAL SEL VERTEBRAL UNI R MOD SED: IMG5368

## 2023-06-21 HISTORY — PX: IR ANGIO INTRA EXTRACRAN SEL COM CAROTID INNOMINATE BILAT MOD SED: IMG5360

## 2023-06-21 LAB — BASIC METABOLIC PANEL
Anion gap: 17 — ABNORMAL HIGH (ref 5–15)
BUN: 21 mg/dL (ref 8–23)
CO2: 27 mmol/L (ref 22–32)
Calcium: 10.1 mg/dL (ref 8.9–10.3)
Chloride: 94 mmol/L — ABNORMAL LOW (ref 98–111)
Creatinine, Ser: 1.3 mg/dL — ABNORMAL HIGH (ref 0.44–1.00)
GFR, Estimated: 41 mL/min — ABNORMAL LOW (ref >60)
Glucose, Bld: 139 mg/dL — ABNORMAL HIGH (ref 70–99)
Potassium: 3.7 mmol/L (ref 3.5–5.1)
Sodium: 138 mmol/L (ref 135–145)

## 2023-06-21 LAB — CBC
HCT: 40.4 % (ref 36.0–46.0)
Hemoglobin: 12.8 g/dL (ref 12.0–15.0)
MCH: 28.1 pg (ref 26.0–34.0)
MCHC: 31.7 g/dL (ref 30.0–36.0)
MCV: 88.8 fL (ref 80.0–100.0)
Platelets: 338 10*3/uL (ref 150–400)
RBC: 4.55 MIL/uL (ref 3.87–5.11)
RDW: 13.9 % (ref 11.5–15.5)
WBC: 9.8 10*3/uL (ref 4.0–10.5)
nRBC: 0 % (ref 0.0–0.2)

## 2023-06-21 LAB — GLUCOSE, CAPILLARY
Glucose-Capillary: 137 mg/dL — ABNORMAL HIGH (ref 70–99)
Glucose-Capillary: 138 mg/dL — ABNORMAL HIGH (ref 70–99)

## 2023-06-21 LAB — PROTIME-INR
INR: 1 (ref 0.8–1.2)
Prothrombin Time: 13 s (ref 11.4–15.2)

## 2023-06-21 MED ORDER — MIDAZOLAM HCL 2 MG/2ML IJ SOLN
INTRAMUSCULAR | Status: AC | PRN
Start: 2023-06-21 — End: 2023-06-21
  Administered 2023-06-21: 1 mg via INTRAVENOUS

## 2023-06-21 MED ORDER — IOHEXOL 300 MG/ML  SOLN: 150.0000 mL | Freq: Once | INTRAMUSCULAR | Status: DC | PRN

## 2023-06-21 MED ORDER — SODIUM CHLORIDE 0.9 % IV SOLN: INTRAVENOUS | Status: AC

## 2023-06-21 MED ORDER — IOHEXOL 300 MG/ML  SOLN
150.0000 mL | Freq: Once | INTRAMUSCULAR | Status: AC | PRN
Start: 2023-06-21 — End: 2023-06-21
  Administered 2023-06-21: 55 mL via INTRA_ARTERIAL

## 2023-06-21 MED ORDER — HEPARIN SODIUM (PORCINE) 1000 UNIT/ML IJ SOLN
INTRAMUSCULAR | Status: AC
Start: 2023-06-21 — End: ?
  Filled 2023-06-21: qty 1

## 2023-06-21 MED ORDER — MIDAZOLAM HCL 2 MG/2ML IJ SOLN
INTRAMUSCULAR | Status: AC
  Filled 2023-06-21: qty 2

## 2023-06-21 MED ORDER — SODIUM CHLORIDE 0.9% FLUSH: 10.0000 mL | INTRAVENOUS | Status: DC

## 2023-06-21 MED ORDER — LIDOCAINE HCL 1 % IJ SOLN
INTRAMUSCULAR | Status: AC
  Filled 2023-06-21: qty 20

## 2023-06-21 MED ORDER — HEPARIN SODIUM (PORCINE) 1000 UNIT/ML IJ SOLN
INTRAMUSCULAR | Status: AC
  Filled 2023-06-21: qty 1

## 2023-06-21 MED ORDER — FENTANYL CITRATE (PF) 100 MCG/2ML IJ SOLN
INTRAMUSCULAR | Status: AC | PRN
  Administered 2023-06-21 (×2): 25 ug via INTRAVENOUS

## 2023-06-21 MED ORDER — HEPARIN SODIUM (PORCINE) 1000 UNIT/ML IJ SOLN
INTRAMUSCULAR | Status: AC | PRN
  Administered 2023-06-21 (×2): 1000 [IU] via INTRAVENOUS

## 2023-06-21 MED ORDER — FENTANYL CITRATE (PF) 100 MCG/2ML IJ SOLN
INTRAMUSCULAR | Status: AC
  Filled 2023-06-21: qty 2

## 2023-06-21 NOTE — Procedures (Signed)
INR.  Status post L4 with cerebral arteriogram.  Left CFA approach.  Findings.  1.  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 2.  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 3.  Severe focal stenosis of the nondominant left VBJ proximal to the origin of the left posterior inferior cerebral artery fills retrogradely from the right vertebral artery. 4.  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  Fatima Sanger MD.

## 2023-06-21 NOTE — Sedation Documentation (Signed)
Handoff given at bedside to [facility], RN in short stay.    Both groin sites remain level 0 with dressings of quik clot, gauze, and tegaderm clean/dry/intact.   Pulses remain dopplerable.

## 2023-06-21 NOTE — H&P (Signed)
Chief Complaint: Patient was seen in consultation today for Cerebral arteriogram at the request of Sethi,Pramod S  Referring Physician(s): Micki Riley  Supervising Physician: Julieanne Cotton  Patient Status: [facility] - Out-pt  History of Present Illness: Mary [name] is a 82 y.o. 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 There is 70% stenosis of proximal right ICA and 50% stenosis of distal left ICA   Scheduled today for Cerebral arteriogram for further evaluation    Past Medical History:  Diagnosis Date   Arthritis    Atrial tachycardia (HCC) 04/23/2017   CAD (coronary artery disease)    non obstructive   Cardiac pacemaker in situ - St Jude 05/13/2014   Permanent pacemaker for second-degree heart block. Procedure complicated by an atrial lead dislodgment and repeat procedure, 12/2013    Carotid stenosis    Carotid US 5/16:  Bilateral ICA 1-39%; L vertebral retrograde; L BP 126/49, R BP 140/57   Chronic combined systolic and diastolic CHF (congestive heart failure) (HCC)    Chronic lower back pain    Colon cancer (HCC)    a. s/p chemo   Colon polyps    Diabetic peripheral neuropathy (HCC)    DVT (deep venous thrombosis) (HCC) 2013   "twice behind knee on left side" (06/25/2013)   Glaucoma    History of chicken pox    Hyperlipidemia    Hypertension    Hypothyroidism    Iron deficiency anemia    Multinodular goiter    Osteoporosis    PAF (paroxysmal atrial fibrillation) (HCC)    Sleep apnea    a. resolved post weight loss     Type II diabetes mellitus (HCC)     Past Surgical History:  Procedure Laterality Date   CARDIAC CATHETERIZATION  2006   Never had PCI, 3 caths total. Last one in [place]   CARDIAC CATHETERIZATION N/A 02/04/2015   Procedure: Left Heart Cath and Coronary Angiography;  Surgeon: Lyn Records, MD;  Location: [facility] INVASIVE CV LAB;  Service: Cardiovascular;  Laterality: N/A;   CATARACT EXTRACTION, BILATERAL Bilateral 2013   "and put stent in my left eye for glaucoma" (06/25/2013)   CHOLECYSTECTOMY  1980's   COLECTOMY  10/2010   Tumor removal   EYE MUSCLE SURGERY Bilateral ~ 1963   "muscles too long; eyes would go out and up; tied muscles to hold my eyes straight" (06/25/2013)   LEAD REVISION  01-03-2014   atrial lead revision by Dr Johney Frame   LEAD REVISION N/A 01/03/2014   Procedure: LEAD REVISION;  Surgeon: Gardiner Rhyme, MD;  Location: [facility] CATH LAB;  Service: Cardiovascular;  Laterality: N/A;   LEFT HEART CATH AND CORONARY ANGIOGRAPHY N/A 05/29/2018   Procedure: LEFT HEART CATH AND CORONARY ANGIOGRAPHY;  Surgeon: Lyn Records, MD;  Location: [facility] INVASIVE CV LAB;  Service: Cardiovascular;  Laterality: N/A;   PACEMAKER INSERTION  01/02/2014   STJ Assurity dual chamber pacemaker implnated by Dr Ladona Ridgel for SSS   PERMANENT PACEMAKER INSERTION N/A 01/02/2014   Procedure: PERMANENT PACEMAKER INSERTION;  Surgeon: Doylene Canning  Ladona Ridgel, MD;  Location: [facility] CATH LAB;  Service: Cardiovascular;  Laterality: N/A;   PORTACATH PLACEMENT Left 11/2010   PPM GENERATOR CHANGEOUT N/A 08/16/2022   Procedure: PPM GENERATOR CHANGEOUT;  Surgeon: Marinus Maw, MD;  Location: [facility] INVASIVE CV LAB;  Service: Cardiovascular;  Laterality: N/A;   ROUX-EN-Y GASTRIC BYPASS  2009   ULNAR TUNNEL RELEASE Left ~ 2008   UMBILICAL HERNIA REPAIR  1970's?    (06/25/2013)    Allergies: Other, Adhesive [tape], Aspirin, Latex, and Nsaids  Medications: Prior to Admission medications   Medication Sig Start Date End Date Taking? Authorizing  Provider  aspirin EC 81 MG tablet Take 1 tablet (81 mg total) by mouth daily. Swallow whole. 06/01/23  Yes Micki Riley, MD  atorvastatin (LIPITOR) 80 MG tablet TAKE 1 TABLET(80 MG) BY MOUTH DAILY Patient taking differently: Take 80 mg by mouth daily. 09/17/19  Yes Myrlene Broker, MD  buPROPion (WELLBUTRIN XL) 150 MG 24 hr tablet TAKE 1 TABLET BY MOUTH EVERY DAY Patient taking differently: Take 150 mg by mouth daily. TAKE 1 TABLET BY MOUTH EVERY DAY 03/23/21  Yes Myrlene Broker, MD  cyanocobalamin (VITAMIN B12) 1000 MCG/ML injection INJECT INTO THE MUSCLE EVERY 30 DAYS Patient taking differently: Inject 1,000 mcg into the muscle every 30 (thirty) days. 09/08/22  Yes Myrlene Broker, MD  empagliflozin (JARDIANCE) 10 MG TABS tablet Take 1 tablet (10 mg total) by mouth daily before breakfast. 04/05/22  Yes Lyn Records, MD  HYDROcodone-acetaminophen (NORCO/VICODIN) 5-325 MG tablet Take 1 tablet by mouth every 6 (six) hours as needed. Patient taking differently: Take 1 tablet by mouth every 6 (six) hours as needed for moderate pain (pain score 4-6). 09/27/22  Yes Myrlene Broker, MD  insulin glargine (LANTUS) 100 UNIT/ML injection Inject 20 Units into the skin in the morning. Sliding Scale   Yes [provider]  insulin lispro (HUMALOG) 100 UNIT/ML injection Inject 4 Units into the skin as needed for high blood sugar. If blood sugar over 200.   Yes [provider]  levothyroxine (SYNTHROID) 137 MCG tablet Take 1 tablet (137 mcg total) by mouth daily. 02/18/22  Yes Myrlene Broker, MD  metoprolol tartrate (LOPRESSOR) 25 MG tablet Take 1 tablet (25 mg total) by mouth 2 (two) times daily. 04/22/23  Yes Marinus Maw, MD  Potassium Chloride ER 20 MEQ TBCR Take 1 tablet (20 mEq total) by mouth 2 (two) times daily. 05/04/23  Yes Croitoru, Mihai, MD  spironolactone (ALDACTONE) 25 MG tablet TAKE 1/2 TABLET BY MOUTH DAILY **MUST KEEP APRIL APPOINTMENT FOR FURTHER  REFILLS. FINAL ATTEMPT** 04/12/23  Yes Croitoru, Mihai, MD  torsemide (DEMADEX) 20 MG tablet If you weigh 200 pounds or less, take 20 mg twice a day. If you weigh over 200 pounds, take 40 mg in the morning and 20 mg in the afternoon 05/11/23  Yes Croitoru, Mihai, MD  Vitamin D, Ergocalciferol, (DRISDOL) 1.25 MG (50000 UNIT) CAPS capsule TAKE 1 CAPSULE BY MOUTH EVERY 7 DAYS Patient taking differently: Take 50,000 Units by mouth every Wednesday. 04/12/22  Yes Myrlene Broker, MD  albuterol (VENTOLIN HFA) 108 (90 Base) MCG/ACT inhaler Inhale 1-2 puffs into the lungs every 6 (six) hours as needed for wheezing or shortness of breath. 10/22/21   Eustace Moore, MD  apixaban (ELIQUIS) 5 MG TABS tablet Take 1 tablet (5 mg total) by mouth 2 (two) times daily. 04/18/23   Marinus Maw, MD  baclofen (LIORESAL) 10 MG  tablet Take 1 tablet (10 mg total) by mouth daily as needed for muscle spasms. 02/25/22   Myrlene Broker, MD  nitroGLYCERIN (NITROSTAT) 0.4 MG SL tablet Place 1 tablet (0.4 mg total) under the tongue every 5 (five) minutes as needed for chest pain. 04/09/19   Lyn Records, MD     Family History  Problem Relation Age of Onset   Breast cancer Mother    Diabetes Mother    Heart disease Father    Heart attack Father    Heart attack Sister    Heart attack Brother    Bladder Cancer Brother    Prostate cancer Brother    Colon polyps Brother    Breast cancer Sister    Breast cancer Maternal Aunt        x 2 aunts   Pancreatic cancer Brother    Colon polyps Brother    Bladder Cancer Cousin    Clotting disorder Sister    Diabetes Sister        x 3   Diabetes Maternal Grandmother     Social History   Socioeconomic History   Marital status: Married    Spouse name: Chrissie Noa   Number of children: 0   Years of education: Not on file   Highest education level: Not on file  Occupational History   Occupation: Retired    Comment: office work  Tobacco Use   Smoking status:  Former    Current packs/day: 0.00    Average packs/day: 1 pack/day for 30.0 years (30.0 ttl pk-yrs)    Types: Cigarettes    Start date: 02/1987    Quit date: 02/2017    Years since quitting: 6.3   Smokeless tobacco: Never   Tobacco comments:    started in her [age] until 2009   Vaping Use   Vaping status: Never Used  Substance and Sexual Activity   Alcohol use: Not Currently    Alcohol/week: 0.0 standard drinks of alcohol   Drug use: No   Sexual activity: Not on file  Other Topics Concern   Not on file  Social History Narrative   Married to husband, Chrissie Noa   No children   Retired Hospital doctor to Electrical engineer   Recently moved back here from [place] [employer] Strain: Low Risk  (12/01/2017)   Overall Financial Resource Strain (CARDIA)    Difficulty of Paying Living Expenses: Not very hard  Food Insecurity: No Food Insecurity (12/05/2022)   Hunger Vital Sign    Worried About Running Out of Food in the Last Year: Never true    Ran Out of Food in the Last Year: Never true  Transportation Needs: No Transportation Needs (12/05/2022)   PRAPARE - Administrator, Civil Service (Medical): No    Lack of Transportation (Non-Medical): No  Physical Activity: Inactive (12/01/2017)   Exercise Vital Sign    Days of Exercise per Week: 0 days    Minutes of Exercise per Session: 0 min  Stress: No Stress Concern Present (05/23/2019)   [employer] of Occupational Health - Occupational Stress Questionnaire    Feeling of Stress : Not at all  Social Connections: Unknown (08/25/2022)   [facility]   Social Network    Social Network: Not on file    Review of Systems: A 12 point ROS discussed and pertinent positives are indicated in the HPI above.  All other systems are negative.  Review of Systems  Constitutional:  Negative for activity change, fatigue and fever.  HENT:  Negative for  tinnitus and trouble swallowing.   Eyes:  Positive for visual disturbance.  Respiratory:  Negative for cough and shortness of breath.   Cardiovascular:  Negative for chest pain.  Gastrointestinal:  Negative for abdominal pain and nausea.  Neurological:  Positive for headaches. Negative for dizziness, tremors, seizures, syncope, facial asymmetry, speech difficulty, weakness, light-headedness and numbness.  Psychiatric/Behavioral:  Negative for behavioral problems, confusion and decreased concentration.     Vital Signs: BP (!) 136/57   Pulse 73   Temp (!) 97.4 F (36.3 C)   Resp 17   Ht 5\' 7"  (1.702 m)   Wt 199 lb (90.3 kg)   SpO2 96%   BMI 31.17 kg/m   Advance Care Plan: The advanced care plan/surrogate decision maker was discussed at the time of visit and documented in the medical record.    Physical Exam Vitals reviewed.  HENT:     Mouth/Throat:     Mouth: Mucous membranes are moist.  Eyes:     Extraocular Movements: Extraocular movements intact.  Cardiovascular:     Rate and Rhythm: Normal rate. Rhythm irregular.     Heart sounds: No murmur heard. Pulmonary:     Effort: Pulmonary effort is normal.     Breath sounds: Normal breath sounds. No wheezing.  Abdominal:     Palpations: Abdomen is soft.     Tenderness: There is no abdominal tenderness.  Musculoskeletal:        General: Normal range of motion.     Cervical back: Normal range of motion.  Skin:    General: Skin is warm.  Neurological:     Mental Status: She is alert and oriented to person, place, and time.  Psychiatric:        Mood and Affect: Mood normal.        Behavior: Behavior normal.        Thought Content: Thought content normal.        Judgment: Judgment normal.     Imaging: CT ANGIO HEAD NECK W WO CM  Result Date: 05/30/2023 CLINICAL DATA:  Suspected vertebral artery dissection. EXAM: CT ANGIOGRAPHY HEAD AND NECK WITH AND WITHOUT CONTRAST TECHNIQUE: Multidetector CT imaging of the head and  neck was performed using the standard protocol during bolus administration of intravenous contrast. Multiplanar CT image reconstructions and MIPs were obtained to evaluate the vascular anatomy. Carotid stenosis measurements (when applicable) are obtained utilizing NASCET criteria, using the distal internal carotid diameter as the denominator. RADIATION DOSE REDUCTION: This exam was performed according to the departmental dose-optimization program which includes automated exposure control, adjustment of the mA and/or kV according to patient size and/or use of iterative reconstruction technique. CONTRAST:  75mL OMNIPAQUE IOHEXOL 350 MG/ML SOLN COMPARISON:  Six hundred twenty-four head CT FINDINGS: CT HEAD FINDINGS Brain: There is no mass, hemorrhage or extra-axial collection. There is generalized atrophy without lobar predilection. There is no acute or chronic infarction. There is hypoattenuation of the periventricular white matter, most commonly indicating chronic ischemic microangiopathy. Skull: The visualized skull base, calvarium and extracranial soft tissues are normal. Sinuses/Orbits: No fluid levels or advanced mucosal thickening of the visualized paranasal sinuses. No mastoid or middle ear effusion. The orbits are normal. CTA NECK FINDINGS SKELETON: There is no bony spinal canal stenosis. No lytic or blastic lesion. OTHER NECK: Normal pharynx, larynx and major salivary glands. No cervical  lymphadenopathy. Unremarkable thyroid gland. UPPER CHEST: No pneumothorax or pleural effusion. No nodules or masses. Cardiomegaly with coronary artery atherosclerosis. AORTIC ARCH: There is calcific atherosclerosis of the aortic arch. Normal 3 vessel branching pattern. There is severe atherosclerosis of the proximal left subclavian artery with near occlusive stenosis. RIGHT CAROTID SYSTEM: No dissection, occlusion or aneurysm. There is mixed density atherosclerosis extending into the proximal ICA, resulting in 70% stenosis.  LEFT CAROTID SYSTEM: No dissection, occlusion or aneurysm. Mild atherosclerotic calcification at the carotid bifurcation without hemodynamically significant stenosis. There is approximately 50% stenosis of the distal ICA just proximal to the skull base. VERTEBRAL ARTERIES: Right dominant configuration.There is atherosclerotic calcification at the origin of the right vertebral artery, which is otherwise normal the skull base. The left vertebral artery V1 and proximal V2 segments are occluded. There is reconstitution at approximately the C4 level with multifocal stenosis of the distal V2 segment, which remains patent. The V3 segment is normal. CTA HEAD FINDINGS POSTERIOR CIRCULATION: --Vertebral arteries: Mild bilateral atherosclerotic calcification without high-grade stenosis. --Inferior cerebellar arteries: Normal. --Basilar artery: Normal. --Superior cerebellar arteries: Normal. --Posterior cerebral arteries (PCA): Normal. ANTERIOR CIRCULATION: --Intracranial internal carotid arteries: Atherosclerotic calcification of the internal carotid arteries at the skull base without hemodynamically significant stenosis. --Anterior cerebral arteries (ACA): Normal. Both A1 segments are present. Patent anterior communicating artery (a-comm). --Middle cerebral arteries (MCA): Normal. VENOUS SINUSES: As permitted by contrast timing, patent. ANATOMIC VARIANTS: None Review of the MIP images confirms the above findings. IMPRESSION: 1. 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 2. 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 3. 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 4. No intracranial arterial occlusion or high-grade stenosis. Aortic  Atherosclerosis (ICD10-I70.0). Electronically Signed   By: Deatra Robinson M.D.   On: 05/30/2023 20:07   ECHOCARDIOGRAM COMPLETE  Result Date: 05/27/2023    ECHOCARDIOGRAM REPORT   Patient Name:   ALPHONSO BIELAK Date of Exam: 05/27/2023 Medical Rec #:  [id number]      Height:       67.0 in Accession #:    [id number]     Weight:       204.8 lb Date of Birth:  20-Nov-1940       BSA:          2.043 m Patient Age:    82 years       BP:           111/62 mmHg Patient Gender: F              HR:           83 bpm. Exam Location:  [address] Procedure: 2D Echo, Cardiac Doppler, Color Doppler and 3D Echo Indications:    Pulmonary hypertension I27.2  History:        Patient has prior history of Echocardiogram examinations, most                 recent 05/26/2018. CAD, Pacemaker, Arrythmias:Atrial                 Fibrillation; Risk Factors:Hypertension, Dyslipidemia and                 Diabetes.  Sonographer:    Thurman Coyer RDCS Referring Phys: [phone]  MIHAI CROITORU IMPRESSIONS  1. Left ventricular ejection fraction, by estimation, is 55%. The left ventricle has normal function. The left ventricle has no regional wall motion abnormalities. There is mild concentric left ventricular hypertrophy. Left ventricular diastolic parameters are indeterminate.  2. Right ventricular systolic function is mildly reduced. The right ventricular size is normal. There is mildly elevated pulmonary artery systolic pressure. The estimated right ventricular systolic pressure is 36.7 mmHg.  3. Left atrial size was mildly dilated.  4. Right atrial size was mildly dilated.  5. The mitral valve is degenerative. Trivial mitral valve regurgitation. No evidence of mitral stenosis. Moderate to severe mitral annular calcification.  6. The aortic valve is tricuspid. There is mild calcification of the aortic valve. Aortic valve regurgitation is not visualized. No aortic stenosis is present.  7. The inferior vena cava is dilated in size with >50% respiratory  variability, suggesting right atrial pressure of 8 mmHg. FINDINGS  Left Ventricle: Left ventricular ejection fraction, by estimation, is 55%. The left ventricle has normal function. The left ventricle has no regional wall motion abnormalities. The left ventricular internal cavity size was normal in size. There is mild concentric left ventricular hypertrophy. Left ventricular diastolic parameters are indeterminate. Right Ventricle: The right ventricular size is normal. No increase in right ventricular wall thickness. Right ventricular systolic function is mildly reduced. There is mildly elevated pulmonary artery systolic pressure. The tricuspid regurgitant velocity  is 2.68 m/s, and with an assumed right atrial pressure of 8 mmHg, the estimated right ventricular systolic pressure is 36.7 mmHg. Left Atrium: Left atrial size was mildly dilated. Right Atrium: Right atrial size was mildly dilated. Pericardium: There is no evidence of pericardial effusion. Mitral Valve: The mitral valve is degenerative in appearance. There is mild calcification of the mitral valve leaflet(s). Moderate to severe mitral annular calcification. Trivial mitral valve regurgitation. No evidence of mitral valve stenosis. Tricuspid Valve: The tricuspid valve is normal in structure. Tricuspid valve regurgitation is mild. Aortic Valve: The aortic valve is tricuspid. There is mild calcification of the aortic valve. Aortic valve regurgitation is not visualized. No aortic stenosis is present. Pulmonic Valve: The pulmonic valve was normal in structure. Pulmonic valve regurgitation is not visualized. Aorta: The aortic root is normal in size and structure. Venous: The inferior vena cava is dilated in size with greater than 50% respiratory variability, suggesting right atrial pressure of 8 mmHg. IAS/Shunts: No atrial level shunt detected by color flow Doppler. Additional Comments: A device lead is visualized in the right ventricle.  LEFT VENTRICLE PLAX 2D  LVIDd:         4.15 cm   Diastology LVIDs:         3.20 cm   LV e' medial:  4.67 cm/s LV PW:         1.25 cm   LV e' lateral: 6.86 cm/s LV IVS:        1.40 cm LVOT diam:     2.00 cm LV SV:         56 LV SV Index:   28 LVOT Area:     3.14 cm  3D Volume EF:                          3D EF:        55 %                          LV  EDV:       91 ml                          LV ESV:       41 ml                          LV SV:        51 ml RIGHT VENTRICLE            IVC RV Basal diam:  3.70 cm    IVC diam: 2.10 cm RV Mid diam:    3.20 cm RV S prime:     8.50 cm/s TAPSE (M-mode): 1.7 cm LEFT ATRIUM             Index        RIGHT ATRIUM           Index LA diam:        4.70 cm 2.30 cm/m   RA Area:     23.70 cm LA Vol (A2C):   72.1 ml 35.30 ml/m  RA Volume:   74.20 ml  36.32 ml/m LA Vol (A4C):   67.3 ml 32.95 ml/m LA Biplane Vol: 70.3 ml 34.41 ml/m  AORTIC VALVE LVOT Vmax:   73.40 cm/s LVOT Vmean:  55.500 cm/s LVOT VTI:    0.179 m  AORTA Ao Root diam: 2.70 cm Ao Asc diam:  3.30 cm TRICUSPID VALVE TR Peak grad:   28.7 mmHg TR Vmax:        268.00 cm/s  SHUNTS Systemic VTI:  0.18 m Systemic Diam: 2.00 cm Dalton McleanMD Electronically signed by Wilfred Lacy Signature Date/Time: 05/27/2023/5:03:04 PM    Final     Labs:  CBC: Recent Labs    12/04/22 2243 12/06/22 0028 05/30/23 1451 06/21/23 0809  WBC 8.3 12.4* 9.3 9.8  HGB 12.7 12.2 12.4 12.8  HCT 39.8 37.9 38.7 40.4  PLT 332 291 329 338    COAGS: Recent Labs    06/21/23 0809  INR 1.0    BMP: Recent Labs    12/04/22 2243 12/06/22 0028 05/30/23 1451 06/21/23 0809  NA 134* 136 136 138  K 4.2 4.3 3.7 3.7  CL 99 97* 96* 94*  CO2 26 28 27 27   GLUCOSE 124* 160* 103* 139*  BUN 27* 25* 21 21  CALCIUM 8.8* 8.9 8.8* 10.1  CREATININE 1.48* 1.53* 1.38* 1.30*  GFRNONAA 35* 34* 38* 41*    LIVER FUNCTION TESTS: Recent Labs    09/27/22 1528 12/04/22 2243  BILITOT 0.4 0.6  AST 11 15  ALT 6 8  ALKPHOS 96 98  PROT 7.5 7.6  ALBUMIN 4.0 3.8     TUMOR MARKERS: No results for input(s): "AFPTM", "CEA", "CA199", "CHROMGRNA" in the last 8760 hours.  Assessment and Plan:  Scheduled today for Cerebral arteriogram Risks and benefits of cerebral angiogram with intervention were discussed with the patient including, but not limited to bleeding, infection, vascular injury, contrast induced renal failure, stroke or even death.  This interventional procedure involves the use of X-rays and because of the nature of the planned procedure, it is possible that we will have prolonged use of X-ray fluoroscopy.  Potential radiation risks to you include (but are not limited to) the following: - A slightly elevated risk for cancer  several years later in life. This risk is typically less than 0.5% percent. This risk is low in comparison to  the normal incidence of human cancer, which is 33% for women and 50% for men according to the American Cancer Society. - Radiation induced injury can include skin redness, resembling a rash, tissue breakdown / ulcers and hair loss (which can be temporary or permanent).   The likelihood of either of these occurring depends on the difficulty of the procedure and whether you are sensitive to radiation due to previous procedures, disease, or genetic conditions.   IF your procedure requires a prolonged use of radiation, you will be notified and given written instructions for further action.  It is your responsibility to monitor the irradiated area for the 2 weeks following the procedure and to notify your physician if you are concerned that you have suffered a radiation induced injury.    All of the patient's questions were answered, patient is agreeable to proceed.  Consent signed and in chart.  Thank you for this interesting consult.  I greatly enjoyed meeting VINNIE CARDOSA and look forward to participating in their care.  A copy of this report was sent to the requesting provider on this date.  Electronically  Signed: Robet Leu, PA-C 06/21/2023, 9:47 AM   I spent a total of  30 Minutes   in face to face in clinical consultation, greater than 50% of which was counseling/coordinating care for Cerebral arteriogram

## 2023-06-27 ENCOUNTER — Telehealth (HOSPITAL_COMMUNITY): Payer: Self-pay | Admitting: Student

## 2023-06-27 ENCOUNTER — Other Ambulatory Visit (HOSPITAL_COMMUNITY): Payer: Self-pay | Admitting: Interventional Radiology

## 2023-06-27 DIAGNOSIS — I771 Stricture of artery: Secondary | ICD-10-CM

## 2023-06-27 MED ORDER — TICAGRELOR 90 MG PO TABS: 90.0000 mg | ORAL_TABLET | Freq: Two times a day (BID) | ORAL | 2 refills | Status: AC

## 2023-06-27 NOTE — Telephone Encounter (Signed)
Patient scheduled for procedure in IR 07/06/23 with Dr. Corliss Skains. Patient to begin taking Brilinta 90 mg BID five days prior to procedure. Patient already takes 81 mg aspirin daily.   Patient also takes Eliquis. Patient aware to hold Eliquis 2 days prior to procedure. She stated she has already cleared this with her cardiologist.  Patient knows she can call IR with any questions prior to her procedure date. She has the number to the IR APP office at [facility].  Alwyn Ren, [place] [phone] 06/27/2023, 3:37 PM

## 2023-07-03 NOTE — Progress Notes (Signed)
I have reviewed the findings of cerebral catheter angiogram and it appears Dr. Corliss Skains is already discussed treatment options with angioplasty stenting with the patient.  Kindly let the patient know I agree with his treatment plan if  patient is willing

## 2023-07-05 ENCOUNTER — Other Ambulatory Visit: Payer: Self-pay | Admitting: Radiology

## 2023-07-05 ENCOUNTER — Encounter: Payer: Self-pay | Admitting: Internal Medicine

## 2023-07-05 ENCOUNTER — Other Ambulatory Visit: Payer: Self-pay

## 2023-07-05 ENCOUNTER — Encounter (HOSPITAL_COMMUNITY): Payer: Self-pay | Admitting: Interventional Radiology

## 2023-07-05 DIAGNOSIS — I771 Stricture of artery: Secondary | ICD-10-CM

## 2023-07-05 NOTE — Progress Notes (Signed)
SDW CALL  Patient was given pre-op instructions over the phone. The opportunity was given for the patient to ask questions. No further questions asked. Patient verbalized understanding of instructions given.   PCP - Dr. Jamie Kato Cardiologist - [name]  PPM/ICD - PPM - St. Jude Device Orders - sent on 11/4 Rep Notified -   Chest x-ray - 12/04/22 EKG - 05/30/23 Stress Test - denies ECHO - 05/2023 Cardiac Cath - 04/2018  Sleep Study - has sleep apnea but does not use a CPAP at this time; having a repeat sleep study in March 2025   Fasting Blood Sugar - low 100's Checks Blood Sugar __4___ times a day  Last dose of Jardiance- 11/4   Blood Thinner Instructions:  -patient instructed to start Brilinta 5 days prior to procedure and reports starting on 11/1; Patient instructed to take Brilinta DOS by Alwyn Ren and is aware to take DOS  -patient was instructed to stop Eliquis and last dose was 11/4  Aspirin Instructions: patient instructed to continue Aspirin 81 mg, including DOS and patient verbalizes understanding  Patient states that she has not needed Nitro in a long time; does not remember last dose   COVID TEST- n/a   Anesthesia review: yes  Patient denies shortness of breath, fever, cough and chest pain over the phone call   All instructions explained to the patient, with a verbal understanding of the material. Patient agrees to go over the instructions while at home for a better understanding.

## 2023-07-05 NOTE — Progress Notes (Signed)
Kerry Fort, [employer] rep, notified to be here for pt's surgery on 07/06/23.

## 2023-07-05 NOTE — Anesthesia Preprocedure Evaluation (Signed)
Anesthesia Evaluation  Patient identified by MRN, date of birth, ID band Patient awake    Reviewed: Allergy & Precautions, H&P , NPO status , Patient's Chart, lab work & pertinent test results  Airway Mallampati: II  TM Distance: >3 FB Neck ROM: Full    Dental no notable dental hx.    Pulmonary sleep apnea , COPD, former smoker   breath sounds clear to auscultation + decreased breath sounds      Cardiovascular hypertension, + CAD and + Peripheral Vascular Disease  Normal cardiovascular exam+ pacemaker  Rhythm:Regular Rate:Normal  1. Left ventricular ejection fraction, by estimation, is 55%. The left  ventricle has normal function. The left ventricle has no regional wall  motion abnormalities. There is mild concentric left ventricular  hypertrophy. Left ventricular diastolic  parameters are indeterminate.   2. Right ventricular systolic function is mildly reduced. The right  ventricular size is normal. There is mildly elevated pulmonary artery  systolic pressure. The estimated right ventricular systolic pressure is  36.7 mmHg.   3. Left atrial size was mildly dilated.   4. Right atrial size was mildly dilated.   5. The mitral valve is degenerative. Trivial mitral valve regurgitation.  No evidence of mitral stenosis. Moderate to severe mitral annular  calcification.   6. The aortic valve is tricuspid. There is mild calcification of the  aortic valve. Aortic valve regurgitation is not visualized. No aortic  stenosis is present.   7. The inferior vena cava is dilated in size with >50% respiratory  variability, suggesting right atrial pressure of 8 mmHg.      Neuro/Psych negative neurological ROS  negative psych ROS   GI/Hepatic negative GI ROS, Neg liver ROS,,,  Endo/Other  diabetesHypothyroidism    Renal/GU negative Renal ROS  negative genitourinary   Musculoskeletal negative musculoskeletal ROS (+)    Abdominal    Peds negative pediatric ROS (+)  Hematology negative hematology ROS (+)   Anesthesia Other Findings   Reproductive/Obstetrics negative OB ROS                              Anesthesia Physical Anesthesia Plan  ASA: 3  Anesthesia Plan: General   Post-op Pain Management: Minimal or no pain anticipated   Induction: Intravenous  PONV Risk Score and Plan: 3 and Ondansetron, Dexamethasone and Treatment may vary due to age or medical condition  Airway Management Planned: Oral ETT  Additional Equipment: Arterial line  Intra-op Plan:   Post-operative Plan: Extubation in OR  Informed Consent: I have reviewed the patients History and Physical, chart, labs and discussed the procedure including the risks, benefits and alternatives for the proposed anesthesia with the patient or authorized representative who has indicated his/her understanding and acceptance.     Dental advisory given  Plan Discussed with: CRNA and Surgeon  Anesthesia Plan Comments: (PAT note written 07/05/2023 by Shonna Chock, PA-C.  )        Anesthesia Quick Evaluation

## 2023-07-05 NOTE — Progress Notes (Signed)
Anesthesia Chart Review: Mary [name]  Case: [id number] Date/Time: 07/06/23 0815   Procedure: Endovascular revascularization of the severe left subclavian artery stenosis   Anesthesia type: General   Pre-op diagnosis: stenosis - I77.1   [facility]   Surgeons: Julieanne Cotton, MD       DISCUSSION: Patient is an 82 year old female scheduled for the above procedure.  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  Last cardiology visit with [name] was on 05/11/23. By [name]  download she had 8% burden of afib. Now 100% ventricular pacing.  Tolerating Eliquis. She had worsening edema without significant change in baseline dyspnea. Fairly sedentary. Previous echo had showed severely elevated pulmonary pressures with PA peak pressure 74 mmHG in setting of diastolic dysfunction, obesity, untreated OSA. Compliance with low salt diet, daily weights, CPAP, levothyroxine recommended. Continue current medications. Repeat echo ordered and done on 05/27/23 showing LVEF 55%, no regional wall motion abnormalities, mild concentric LVH, indeterminate LV diastolic parameters, mildly reduced RV systolic function, mildly elevated PASP, elevated RVSP 36.7 mmHg, trivial MR, no AS noted.  She was evaluated by neurologist Dr. Pearlean Brownie on 06/01/23 following ED visit for recurrent sudden onset of  severe headaches accompanied with blurred vision and confusion that can last around 30 minutes or less. 4th episode since 2016. MRI was not done due to PPM. 05/30/23 CTA showed occlusion of the left vertebral artery in the V1 V2 segment with distal reconstitution at the V4 segments, severe stenosis of proximal left subclavian artery, 70% stenosis of proximal right ICA and 50% stenosis of distal left ICA. He questioned if episodes represented vertebrobasilar TIAs given significant subclavian and vertebral artery stenosis. Diagnostic catheter cerebral angiogram planned and referred to IR. Findings as below and reviewed by Dr. Corliss Skains and Dr. Pearlean Brownie with above procedure planned.   S/p 4V Cerebral artreiogram on 06/21/23 showing: Findings. 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 2.  Probable high-grade 80% stenosis of the proximal right ICA. 3.  High-grade stenosis of the non dominant left vertebrobasilar junction just proximal to the origin of the left posterior-inferior cerebellar artery. 4.  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 5.  Mild intracranial arteriosclerosis involving the left MCA M1 segment, and the left anterior cerebral artery proximal A1 segment. PLAN: Given the significant stenosis with associated left subclavian steal endovascular treatment of the left subclavian artery stenosis proximally recommended.   Prior to diagnostic cerebral angiogram, Dr. Corliss Skains reached out to cardiology. No additional cardiac testing recommended for that procedure with permission to temporarily hold Eliquis. For this procedure, she reported instructions to hold Eliquis after 07/04/23 dose, starting Brilinta on 07/01/23 and continuing ASA 81 mg.   Anesthesia team to evaluate on the day of surgery. PPM Device order form sent to [facility]  but are pending.    VS:  Wt Readings from Last 3 Encounters:  06/21/23 90.3 kg  06/01/23 90.3 kg  05/30/23 85.3 kg   BP Readings from Last 3 Encounters:  06/21/23 139/72  06/01/23 (!) 128/59  05/30/23 111/67   Pulse Readings from Last 3 Encounters:  06/21/23 71  06/01/23 74  05/30/23 74     PROVIDERS: Laroy Apple, [place] is PCP. Visit on 07/01/23 and notes plans for procedure. Note reviewed in Care Everywhere.  Croitoru, Rachelle Hora, MD is cardiologist Delia Heady, MD is neurologist Talmage Coin, MD is endocrinologist - She has Sleep Consult on 11/17/22 at [facility] with Garen Lah, PA-C.   LABS: For day of procedure as indicated. Last results in [facility] include: Lab Results  Component Value Date   WBC 9.8 06/21/2023   HGB 12.8 06/21/2023   HCT 40.4 06/21/2023   PLT 338 06/21/2023   GLUCOSE 139 (H) 06/21/2023   CHOL 138 06/01/2023   TRIG 151 (H) 06/01/2023   HDL 40 06/01/2023   LDLDIRECT 152.0 09/27/2022   LDLCALC 72 06/01/2023   ALT 8 12/04/2022   AST 15 12/04/2022   NA 138 06/21/2023   K 3.7 06/21/2023   CL 94 (L) 06/21/2023   CREATININE 1.30 (H) 06/21/2023   BUN 21 06/21/2023   CO2 27 06/21/2023   INR 1.0 06/21/2023   HGBA1C 8.8 (H) 06/01/2023   TSH 0.44 on 05/13/23 at [facility].    IMAGES: CT Head & CTA Head/Neck 05/30/23: IMPRESSION: 1. 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 2. 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 3. 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 4. No intracranial arterial occlusion or high-grade stenosis. - Aortic Atherosclerosis (ICD10-I70.0). - There is no acute or chronic infarction. There is  hypoattenuation of the periventricular white matter, most commonly indicating chronic ischemic microangiopathy.   EKG: 05/30/23: Accelerated junctional rhythm Nonspecific IVCD with LAD LVH with secondary repolarization abnormality Anterolateral infarct, age indeterminate Paced rhythm Confirmed by Coralee Pesa [phone]) on 05/30/2023 3:29:49 PM - AV paced rhythm.   CV: Echo 05/27/23: IMPRESSIONS   1. Left ventricular ejection fraction, by estimation, is 55%. The left  ventricle has normal function. The left ventricle has no regional wall  motion abnormalities. There is mild concentric left ventricular  hypertrophy. Left ventricular diastolic  parameters are indeterminate.   2. Right ventricular systolic function is mildly reduced. The right  ventricular size is normal. There is mildly elevated pulmonary artery  systolic pressure. The estimated right ventricular systolic pressure is  36.7 mmHg.   3. Left atrial size was mildly dilated.   4. Right atrial size was mildly dilated.   5. The mitral valve is degenerative. Trivial mitral valve regurgitation.  No evidence of mitral stenosis. Moderate to severe mitral annular  calcification.   6. The aortic valve is tricuspid. There is mild calcification of the  aortic valve. Aortic valve regurgitation is not visualized. No aortic  stenosis is present.   7. The inferior vena cava is dilated in size with >50% respiratory  variability, suggesting right atrial pressure of 8 mmHg.  - Comparison limited echo 05/26/18 LVEF 55-60% with apical septal hypokinesis; complete echo 05/22/18: LVEF 40-45%, diffuse LV hypokinesis, very mild AS with AVA VTI 2.07 cm2, AV mean gradient 5 mmHg, AV peak gradient 9 mmHg, mild-moderate TR, severely  increased PA systolic pressure with PA peak pressure 74 mmHg.   Long term monitor 11/29/22 - 12/02/22: Sinus tachycardia vs atrial tachycardia with ventricular pacing Rare PVC's No obvious atrial fib or flutter Periods  of increased atrial rate with ventricular pacing likely represent atrial tachycardia    LHC 05/29/18: When compared to prior coronary angiography, no significant change has occurred. Proximal/ostial 25% left main. Calcified proximal to mid LAD with 20 to 30% narrowing.  Distal to apical LAD diffusely involved with up to 50 to 70% narrowing.  Apical LAD actually appears improved compared to prior. Circumflex is patent with irregularities noted in the circumflex and obtuse marginal branches up to 40 to 50%. Diffuse luminal irregularities throughout the right coronary up to 40%.  Right coronary is dominant. Dyssynergy of LV contractility pattern likely related to RV pacing.  There may be focal apical akinesis.  EF is 50%.  LVEDP is normal.   RECOMMENDATIONS: Primary risk prevention with aggressive lipid-lowering, hemoglobin A1c less than 7, weight loss, evaluation and treatment of probable sleep apnea, blood pressure target less than or equal to 130/80 mmHg, and moderate intensity aerobic activity greater than 150 minutes/week. No indication for antiplatelet therapy at this time.   Past Medical History:  Diagnosis Date   Arthritis    Atrial tachycardia (HCC) 04/23/2017   CAD (coronary artery disease)    non obstructive   Cardiac pacemaker in situ - St Jude 05/13/2014   Permanent pacemaker for second-degree heart block. Procedure complicated by an atrial lead dislodgment and repeat procedure, 12/2013    Carotid stenosis    Carotid US 5/16:  Bilateral ICA 1-39%; L vertebral retrograde; L BP 126/49, R BP 140/57   Chronic combined systolic and diastolic CHF (congestive heart failure) (HCC)    Chronic lower back pain    Colon cancer (HCC)    a. s/p chemo   Colon polyps    Diabetic peripheral neuropathy (HCC)    DVT (deep venous thrombosis) (HCC) 2013   "twice behind knee on left side" (06/25/2013)   Glaucoma    History of chicken pox    Hyperlipidemia    Hypertension    Hypothyroidism     Iron deficiency anemia    Multinodular goiter    Osteoporosis    PAF (paroxysmal atrial fibrillation) (HCC)    Sleep apnea    Type II diabetes mellitus (HCC)     Past Surgical History:  Procedure Laterality Date   CARDIAC CATHETERIZATION  2006   Never had PCI, 3 caths total. Last one in [place]   CARDIAC CATHETERIZATION N/A 02/04/2015   Procedure: Left Heart Cath and Coronary Angiography;  Surgeon: Lyn Records, MD;  Location: [facility] INVASIVE CV LAB;  Service: Cardiovascular;  Laterality: N/A;   CATARACT EXTRACTION, BILATERAL Bilateral 2013   "and put stent in my left eye for glaucoma" (06/25/2013)   CHOLECYSTECTOMY  1980's   COLECTOMY  10/2010   Tumor removal   EYE MUSCLE SURGERY Bilateral ~ 1963   "muscles too long; eyes would go out and up; tied muscles to hold my eyes straight" (06/25/2013)   IR ANGIO INTRA EXTRACRAN SEL COM CAROTID INNOMINATE BILAT MOD SED  06/21/2023   IR ANGIO VERTEBRAL SEL VERTEBRAL UNI R MOD SED  06/21/2023   LEAD REVISION  01-03-2014   atrial lead revision by Dr Johney Frame   LEAD REVISION N/A 01/03/2014   Procedure: LEAD REVISION;  Surgeon: Gardiner Rhyme, MD;  Location: [facility] CATH LAB;  Service: Cardiovascular;  Laterality:  N/A;   LEFT HEART CATH AND CORONARY ANGIOGRAPHY N/A 05/29/2018   Procedure: LEFT HEART CATH AND CORONARY ANGIOGRAPHY;  Surgeon: Lyn Records, MD;  Location: [facility] INVASIVE CV LAB;  Service: Cardiovascular;  Laterality: N/A;   PACEMAKER INSERTION  01/02/2014   STJ Assurity dual chamber pacemaker implnated by Dr Ladona Ridgel for SSS   PERMANENT PACEMAKER INSERTION N/A 01/02/2014   Procedure: PERMANENT PACEMAKER INSERTION;  Surgeon: Marinus Maw, MD;  Location: [facility] CATH LAB;  Service: Cardiovascular;  Laterality: N/A;   PORTACATH PLACEMENT Left 11/2010   PPM GENERATOR CHANGEOUT N/A 08/16/2022   Procedure: PPM GENERATOR CHANGEOUT;  Surgeon: Marinus Maw, MD;  Location: [facility] INVASIVE CV LAB;  Service: Cardiovascular;  Laterality: N/A;   ROUX-EN-Y GASTRIC BYPASS   2009   ULNAR TUNNEL RELEASE Left ~ 2008   UMBILICAL HERNIA REPAIR  1970's?    (06/25/2013)    MEDICATIONS: No current facility-administered medications for this encounter.    albuterol (VENTOLIN HFA) 108 (90 Base) MCG/ACT inhaler   apixaban (ELIQUIS) 5 MG TABS tablet   aspirin EC 81 MG tablet   atorvastatin (LIPITOR) 80 MG tablet   baclofen (LIORESAL) 10 MG tablet   Cyanocobalamin (VITAMIN B-12) 5000 MCG SUBL   cyanocobalamin (VITAMIN B12) 1000 MCG/ML injection   empagliflozin (JARDIANCE) 10 MG TABS tablet   HYDROcodone-acetaminophen (NORCO/VICODIN) 5-325 MG tablet   insulin glargine (LANTUS) 100 UNIT/ML injection   insulin lispro (HUMALOG) 100 UNIT/ML injection   levothyroxine (SYNTHROID) 137 MCG tablet   magnesium gluconate (MAGONATE) 500 MG tablet   metoprolol tartrate (LOPRESSOR) 25 MG tablet   nitroGLYCERIN (NITROSTAT) 0.4 MG SL tablet   Potassium Chloride ER 20 MEQ TBCR   spironolactone (ALDACTONE) 25 MG tablet   torsemide (DEMADEX) 20 MG tablet   Vitamin D, Ergocalciferol, (DRISDOL) 1.25 MG (50000 UNIT) CAPS capsule   ticagrelor (BRILINTA) 90 MG TABS tablet    Shonna Chock, PA-C Surgical Short Stay/Anesthesiology [facility] Phone [phone] [facility] Phone [phone] 07/05/2023 10:37 AM

## 2023-07-05 NOTE — Progress Notes (Signed)
PERIOPERATIVE PRESCRIPTION FOR IMPLANTED CARDIAC DEVICE PROGRAMMING  Patient Information: Name:  Mary [name]  DOB:  Jan 21, 1941  MRN:  [id number]    Planned Procedure:  Revascularization of the severe left subclavian artery stenosis  Surgeon:  Dr. Julieanne Cotton  Date of Procedure:  07/06/2023  Cautery will be used.  Position during surgery:    Please send documentation back to:  [facility] (Fax # [phone])  Device Information:  Clinic EP Physician:  Lewayne Bunting, MD   Device Type:  Pacemaker Manufacturer and Phone #:  St. Jude/Abbott: [phone] Pacemaker Dependent?:  Yes.   Date of Last Device Check:  06/02/2023 Normal Device Function?:  Yes.    Electrophysiologist's Recommendations:  Have magnet available. Provide continuous ECG monitoring when magnet is used or reprogramming is to be performed.  Procedure will likely interfere with device function.  Device should be programmed:  Asynchronous pacing during procedure and returned to normal programming after procedure  Per Device Clinic Standing Orders, Lenor Coffin, RN  11:00 AM 07/05/2023

## 2023-07-06 ENCOUNTER — Inpatient Hospital Stay (HOSPITAL_COMMUNITY)
Admission: RE | Admit: 2023-07-06 | Discharge: 2023-07-07 | DRG: 253 | Disposition: A | Discharge status: Home / Self Care | Payer: Medicare Other | Attending: Interventional Radiology | Admitting: Interventional Radiology

## 2023-07-06 ENCOUNTER — Inpatient Hospital Stay (HOSPITAL_COMMUNITY): Payer: Medicare Other | Admitting: Vascular Surgery

## 2023-07-06 ENCOUNTER — Encounter (HOSPITAL_COMMUNITY)
Admission: RE | Disposition: A | Discharge status: Home / Self Care | Payer: Self-pay | Source: Home / Self Care | Attending: Interventional Radiology

## 2023-07-06 ENCOUNTER — Encounter (HOSPITAL_COMMUNITY): Payer: Self-pay

## 2023-07-06 ENCOUNTER — Encounter (HOSPITAL_COMMUNITY): Payer: Self-pay | Admitting: Interventional Radiology

## 2023-07-06 ENCOUNTER — Inpatient Hospital Stay (HOSPITAL_COMMUNITY)
Admission: RE | Admit: 2023-07-06 | Discharge: 2023-07-06 | Disposition: A | Discharge status: Home / Self Care | Payer: Medicare Other | Source: Ambulatory Visit | Attending: Interventional Radiology | Admitting: Interventional Radiology

## 2023-07-06 ENCOUNTER — Other Ambulatory Visit (HOSPITAL_COMMUNITY): Payer: Self-pay

## 2023-07-06 DIAGNOSIS — Z86718 Personal history of other venous thrombosis and embolism: Secondary | ICD-10-CM

## 2023-07-06 DIAGNOSIS — Z886 Allergy status to analgesic agent status: Secondary | ICD-10-CM

## 2023-07-06 DIAGNOSIS — I441 Atrioventricular block, second degree: Secondary | ICD-10-CM | POA: Diagnosis present

## 2023-07-06 DIAGNOSIS — I720 Aneurysm of carotid artery: Secondary | ICD-10-CM | POA: Diagnosis present

## 2023-07-06 DIAGNOSIS — I5042 Chronic combined systolic (congestive) and diastolic (congestive) heart failure: Secondary | ICD-10-CM | POA: Diagnosis present

## 2023-07-06 DIAGNOSIS — I771 Stricture of artery: Secondary | ICD-10-CM | POA: Diagnosis not present

## 2023-07-06 DIAGNOSIS — Z91040 Latex allergy status: Secondary | ICD-10-CM | POA: Diagnosis not present

## 2023-07-06 DIAGNOSIS — Z9221 Personal history of antineoplastic chemotherapy: Secondary | ICD-10-CM | POA: Diagnosis not present

## 2023-07-06 DIAGNOSIS — Z79890 Hormone replacement therapy (postmenopausal): Secondary | ICD-10-CM

## 2023-07-06 DIAGNOSIS — M545 Low back pain, unspecified: Secondary | ICD-10-CM | POA: Diagnosis present

## 2023-07-06 DIAGNOSIS — Z85038 Personal history of other malignant neoplasm of large intestine: Secondary | ICD-10-CM

## 2023-07-06 DIAGNOSIS — I672 Cerebral atherosclerosis: Secondary | ICD-10-CM | POA: Diagnosis present

## 2023-07-06 DIAGNOSIS — M810 Age-related osteoporosis without current pathological fracture: Secondary | ICD-10-CM | POA: Diagnosis present

## 2023-07-06 DIAGNOSIS — Z803 Family history of malignant neoplasm of breast: Secondary | ICD-10-CM

## 2023-07-06 DIAGNOSIS — G8929 Other chronic pain: Secondary | ICD-10-CM | POA: Diagnosis present

## 2023-07-06 DIAGNOSIS — Z9889 Other specified postprocedural states: Secondary | ICD-10-CM

## 2023-07-06 DIAGNOSIS — E039 Hypothyroidism, unspecified: Secondary | ICD-10-CM | POA: Diagnosis present

## 2023-07-06 DIAGNOSIS — I495 Sick sinus syndrome: Secondary | ICD-10-CM | POA: Diagnosis present

## 2023-07-06 DIAGNOSIS — Z950 Presence of cardiac pacemaker: Secondary | ICD-10-CM | POA: Diagnosis not present

## 2023-07-06 DIAGNOSIS — E1142 Type 2 diabetes mellitus with diabetic polyneuropathy: Secondary | ICD-10-CM | POA: Diagnosis present

## 2023-07-06 DIAGNOSIS — Z7984 Long term (current) use of oral hypoglycemic drugs: Secondary | ICD-10-CM

## 2023-07-06 DIAGNOSIS — Z794 Long term (current) use of insulin: Secondary | ICD-10-CM | POA: Diagnosis not present

## 2023-07-06 DIAGNOSIS — I2510 Atherosclerotic heart disease of native coronary artery without angina pectoris: Secondary | ICD-10-CM | POA: Diagnosis present

## 2023-07-06 DIAGNOSIS — E785 Hyperlipidemia, unspecified: Secondary | ICD-10-CM | POA: Diagnosis present

## 2023-07-06 DIAGNOSIS — I480 Paroxysmal atrial fibrillation: Secondary | ICD-10-CM | POA: Diagnosis present

## 2023-07-06 DIAGNOSIS — I110 Hypertensive heart disease with heart failure: Secondary | ICD-10-CM | POA: Diagnosis present

## 2023-07-06 DIAGNOSIS — I6502 Occlusion and stenosis of left vertebral artery: Secondary | ICD-10-CM | POA: Diagnosis present

## 2023-07-06 DIAGNOSIS — Z8249 Family history of ischemic heart disease and other diseases of the circulatory system: Secondary | ICD-10-CM

## 2023-07-06 DIAGNOSIS — G4733 Obstructive sleep apnea (adult) (pediatric): Secondary | ICD-10-CM | POA: Diagnosis present

## 2023-07-06 DIAGNOSIS — Z9841 Cataract extraction status, right eye: Secondary | ICD-10-CM

## 2023-07-06 DIAGNOSIS — Z79899 Other long term (current) drug therapy: Secondary | ICD-10-CM

## 2023-07-06 DIAGNOSIS — I708 Atherosclerosis of other arteries: Secondary | ICD-10-CM | POA: Diagnosis present

## 2023-07-06 DIAGNOSIS — Z87891 Personal history of nicotine dependence: Secondary | ICD-10-CM

## 2023-07-06 DIAGNOSIS — I6521 Occlusion and stenosis of right carotid artery: Secondary | ICD-10-CM | POA: Diagnosis present

## 2023-07-06 DIAGNOSIS — Z9884 Bariatric surgery status: Secondary | ICD-10-CM

## 2023-07-06 DIAGNOSIS — Z7902 Long term (current) use of antithrombotics/antiplatelets: Secondary | ICD-10-CM

## 2023-07-06 DIAGNOSIS — Z860100 Personal history of colon polyps, unspecified: Secondary | ICD-10-CM | POA: Diagnosis not present

## 2023-07-06 DIAGNOSIS — Z800 Family history of malignant neoplasm of digestive organs: Secondary | ICD-10-CM

## 2023-07-06 DIAGNOSIS — Z7982 Long term (current) use of aspirin: Secondary | ICD-10-CM

## 2023-07-06 DIAGNOSIS — Z8679 Personal history of other diseases of the circulatory system: Secondary | ICD-10-CM

## 2023-07-06 DIAGNOSIS — Z7901 Long term (current) use of anticoagulants: Secondary | ICD-10-CM

## 2023-07-06 DIAGNOSIS — Z8042 Family history of malignant neoplasm of prostate: Secondary | ICD-10-CM

## 2023-07-06 DIAGNOSIS — Z83719 Family history of colon polyps, unspecified: Secondary | ICD-10-CM

## 2023-07-06 DIAGNOSIS — Z9842 Cataract extraction status, left eye: Secondary | ICD-10-CM

## 2023-07-06 DIAGNOSIS — Z9049 Acquired absence of other specified parts of digestive tract: Secondary | ICD-10-CM

## 2023-07-06 DIAGNOSIS — Z832 Family history of diseases of the blood and blood-forming organs and certain disorders involving the immune mechanism: Secondary | ICD-10-CM

## 2023-07-06 DIAGNOSIS — Z833 Family history of diabetes mellitus: Secondary | ICD-10-CM

## 2023-07-06 DIAGNOSIS — Z8052 Family history of malignant neoplasm of bladder: Secondary | ICD-10-CM

## 2023-07-06 HISTORY — PX: IR TRANSCATH PLC STENT 1ST ART NOT LE CV CAR VERT CAR: IMG5443

## 2023-07-06 HISTORY — PX: IR ANGIO VERTEBRAL SEL SUBCLAVIAN INNOMINATE UNI L MOD SED: IMG5364

## 2023-07-06 HISTORY — PX: RADIOLOGY WITH ANESTHESIA: SHX6223

## 2023-07-06 HISTORY — PX: IR CT HEAD LTD: IMG2386

## 2023-07-06 LAB — POCT ACTIVATED CLOTTING TIME
Activated Clotting Time: 153 s
Activated Clotting Time: 176 s

## 2023-07-06 LAB — BASIC METABOLIC PANEL
Anion gap: 9 (ref 5–15)
BUN: 21 mg/dL (ref 8–23)
CO2: 28 mmol/L (ref 22–32)
Calcium: 9.2 mg/dL (ref 8.9–10.3)
Chloride: 100 mmol/L (ref 98–111)
Creatinine, Ser: 1.4 mg/dL — ABNORMAL HIGH (ref 0.44–1.00)
GFR, Estimated: 38 mL/min — ABNORMAL LOW (ref >60)
Glucose, Bld: 109 mg/dL — ABNORMAL HIGH (ref 70–99)
Potassium: 4.4 mmol/L (ref 3.5–5.1)
Sodium: 137 mmol/L (ref 135–145)

## 2023-07-06 LAB — CBC WITH DIFFERENTIAL/PLATELET
Abs Immature Granulocytes: 0.04 10*3/uL (ref 0.00–0.07)
Basophils Absolute: 0.1 10*3/uL (ref 0.0–0.1)
Basophils Relative: 1 %
Eosinophils Absolute: 0.2 10*3/uL (ref 0.0–0.5)
Eosinophils Relative: 2 %
HCT: 37.8 % (ref 36.0–46.0)
Hemoglobin: 11.9 g/dL — ABNORMAL LOW (ref 12.0–15.0)
Immature Granulocytes: 0 %
Lymphocytes Relative: 18 %
Lymphs Abs: 1.7 10*3/uL (ref 0.7–4.0)
MCH: 28.3 pg (ref 26.0–34.0)
MCHC: 31.5 g/dL (ref 30.0–36.0)
MCV: 89.8 fL (ref 80.0–100.0)
Monocytes Absolute: 0.8 10*3/uL (ref 0.1–1.0)
Monocytes Relative: 8 %
Neutro Abs: 6.7 10*3/uL (ref 1.7–7.7)
Neutrophils Relative %: 71 %
Platelets: 341 10*3/uL (ref 150–400)
RBC: 4.21 MIL/uL (ref 3.87–5.11)
RDW: 14.3 % (ref 11.5–15.5)
WBC: 9.5 10*3/uL (ref 4.0–10.5)
nRBC: 0 % (ref 0.0–0.2)

## 2023-07-06 LAB — PROTIME-INR
INR: 1 (ref 0.8–1.2)
Prothrombin Time: 13.5 s (ref 11.4–15.2)

## 2023-07-06 LAB — HEPARIN LEVEL (UNFRACTIONATED): Heparin Unfractionated: 0.2 [IU]/mL — ABNORMAL LOW (ref 0.30–0.70)

## 2023-07-06 LAB — GLUCOSE, CAPILLARY
Glucose-Capillary: 104 mg/dL — ABNORMAL HIGH (ref 70–99)
Glucose-Capillary: 90 mg/dL (ref 70–99)
Glucose-Capillary: 105 mg/dL — ABNORMAL HIGH (ref 70–99)

## 2023-07-06 LAB — APTT: aPTT: 42 s — ABNORMAL HIGH (ref 24–36)

## 2023-07-06 LAB — MRSA NEXT GEN BY PCR, NASAL: MRSA by PCR Next Gen: NOT DETECTED

## 2023-07-06 SURGERY — IR WITH ANESTHESIA
Anesthesia: General

## 2023-07-06 MED ORDER — CHLORHEXIDINE GLUCONATE CLOTH 2 % EX PADS
6.0000 | MEDICATED_PAD | Freq: Every day | CUTANEOUS | Status: DC
  Administered 2023-07-06 – 2023-07-07 (×2): 6 via TOPICAL

## 2023-07-06 MED ORDER — ROCURONIUM BROMIDE 10 MG/ML (PF) SYRINGE
PREFILLED_SYRINGE | INTRAVENOUS | Status: DC | PRN
  Administered 2023-07-06: 60 mg via INTRAVENOUS
  Administered 2023-07-06: 20 mg via INTRAVENOUS

## 2023-07-06 MED ORDER — NIMODIPINE 30 MG PO CAPS: 0.0000 mg | ORAL_CAPSULE | ORAL | Status: DC

## 2023-07-06 MED ORDER — CEFAZOLIN SODIUM-DEXTROSE 2-4 GM/100ML-% IV SOLN
2.0000 g | INTRAVENOUS | Status: AC
  Administered 2023-07-06: 2 g via INTRAVENOUS
  Filled 2023-07-06 (×2): qty 100

## 2023-07-06 MED ORDER — ORAL CARE MOUTH RINSE: 15.0000 mL | OROMUCOSAL | Status: DC | PRN

## 2023-07-06 MED ORDER — SODIUM CHLORIDE 0.9% FLUSH: 10.0000 mL | Freq: Two times a day (BID) | INTRAVENOUS | Status: DC

## 2023-07-06 MED ORDER — HEPARIN SODIUM (PORCINE) 1000 UNIT/ML IJ SOLN
INTRAMUSCULAR | Status: DC | PRN
  Administered 2023-07-06: 3000 [IU] via INTRAVENOUS
  Administered 2023-07-06: 1000 [IU] via INTRAVENOUS

## 2023-07-06 MED ORDER — HEPARIN (PORCINE) 25000 UT/250ML-% IV SOLN
500.0000 [IU]/h | INTRAVENOUS | Status: DC
  Administered 2023-07-06: 500 [IU]/h via INTRAVENOUS

## 2023-07-06 MED ORDER — CHLORHEXIDINE GLUCONATE 0.12 % MT SOLN
OROMUCOSAL | Status: AC
  Administered 2023-07-06: 15 mL
  Filled 2023-07-06: qty 15

## 2023-07-06 MED ORDER — PHENYLEPHRINE HCL-NACL 20-0.9 MG/250ML-% IV SOLN
INTRAVENOUS | Status: DC | PRN
  Administered 2023-07-06: 40 ug/min via INTRAVENOUS

## 2023-07-06 MED ORDER — CLEVIDIPINE BUTYRATE 0.5 MG/ML IV EMUL
INTRAVENOUS | Status: AC
  Filled 2023-07-06: qty 50

## 2023-07-06 MED ORDER — FENTANYL CITRATE (PF) 100 MCG/2ML IJ SOLN: 25.0000 ug | INTRAMUSCULAR | Status: DC | PRN

## 2023-07-06 MED ORDER — ONDANSETRON HCL 4 MG/2ML IJ SOLN
INTRAMUSCULAR | Status: DC | PRN
  Administered 2023-07-06: 4 mg via INTRAVENOUS

## 2023-07-06 MED ORDER — HEPARIN (PORCINE) 25000 UT/250ML-% IV SOLN
INTRAVENOUS | Status: AC
  Filled 2023-07-06: qty 250

## 2023-07-06 MED ORDER — CLEVIDIPINE BUTYRATE 0.5 MG/ML IV EMUL
INTRAVENOUS | Status: DC | PRN
  Administered 2023-07-06: 2 mg/h via INTRAVENOUS

## 2023-07-06 MED ORDER — SODIUM CHLORIDE 0.9 % IV SOLN: INTRAVENOUS | Status: DC | PRN

## 2023-07-06 MED ORDER — EPTIFIBATIDE 20 MG/10ML IV SOLN
INTRAVENOUS | Status: AC | PRN
  Administered 2023-07-06 (×2): 1.5 mg via INTRAVENOUS

## 2023-07-06 MED ORDER — LIDOCAINE 2% (20 MG/ML) 5 ML SYRINGE
INTRAMUSCULAR | Status: DC | PRN
  Administered 2023-07-06: 100 mg via INTRAVENOUS

## 2023-07-06 MED ORDER — PHENYLEPHRINE 80 MCG/ML (10ML) SYRINGE FOR IV PUSH (FOR BLOOD PRESSURE SUPPORT)
PREFILLED_SYRINGE | INTRAVENOUS | Status: DC | PRN
  Administered 2023-07-06: 80 ug via INTRAVENOUS
  Administered 2023-07-06 (×3): 160 ug via INTRAVENOUS

## 2023-07-06 MED ORDER — TICAGRELOR 90 MG PO TABS
90.0000 mg | ORAL_TABLET | Freq: Two times a day (BID) | ORAL | Status: DC
  Administered 2023-07-06 – 2023-07-07 (×2): 90 mg via ORAL
  Filled 2023-07-06 (×2): qty 1

## 2023-07-06 MED ORDER — ACETAMINOPHEN 650 MG RE SUPP: 650.0000 mg | RECTAL | Status: DC | PRN

## 2023-07-06 MED ORDER — HEPARIN (PORCINE) 25000 UT/250ML-% IV SOLN
650.0000 [IU]/h | INTRAVENOUS | Status: DC
  Administered 2023-07-06: 650 [IU]/h via INTRAVENOUS
  Filled 2023-07-06: qty 250

## 2023-07-06 MED ORDER — ONDANSETRON HCL 4 MG/2ML IJ SOLN: 4.0000 mg | Freq: Once | INTRAMUSCULAR | Status: DC | PRN

## 2023-07-06 MED ORDER — SUGAMMADEX SODIUM 200 MG/2ML IV SOLN
INTRAVENOUS | Status: DC | PRN
  Administered 2023-07-06: 200 mg via INTRAVENOUS
  Administered 2023-07-06: 100 mg via INTRAVENOUS

## 2023-07-06 MED ORDER — ACETAMINOPHEN 325 MG PO TABS
650.0000 mg | ORAL_TABLET | ORAL | Status: DC | PRN
  Administered 2023-07-06: 650 mg via ORAL
  Filled 2023-07-06: qty 2

## 2023-07-06 MED ORDER — PROPOFOL 10 MG/ML IV BOLUS
INTRAVENOUS | Status: DC | PRN
  Administered 2023-07-06: 100 mg via INTRAVENOUS
  Administered 2023-07-06: 20 mg via INTRAVENOUS
  Administered 2023-07-06: 30 mg via INTRAVENOUS

## 2023-07-06 MED ORDER — HEPARIN (PORCINE) 25000 UT/250ML-% IV SOLN: 700.0000 [IU]/h | INTRAVENOUS | Status: DC

## 2023-07-06 MED ORDER — TICAGRELOR 90 MG PO TABS: 90.0000 mg | ORAL_TABLET | Freq: Two times a day (BID) | ORAL | Status: DC

## 2023-07-06 MED ORDER — FENTANYL CITRATE (PF) 100 MCG/2ML IJ SOLN
INTRAMUSCULAR | Status: AC
  Filled 2023-07-06: qty 2

## 2023-07-06 MED ORDER — ACETAMINOPHEN 160 MG/5ML PO SOLN: 650.0000 mg | ORAL | Status: DC | PRN

## 2023-07-06 MED ORDER — CLEVIDIPINE BUTYRATE 0.5 MG/ML IV EMUL
0.0000 mg/h | INTRAVENOUS | Status: DC
  Administered 2023-07-06: 2 mg/h via INTRAVENOUS
  Administered 2023-07-06: 4 mg/h via INTRAVENOUS
  Administered 2023-07-07: 2 mg/h via INTRAVENOUS
  Filled 2023-07-06 (×2): qty 50

## 2023-07-06 MED ORDER — EPTIFIBATIDE 20 MG/10ML IV SOLN
INTRAVENOUS | Status: AC
Start: 2023-07-06 — End: ?
  Filled 2023-07-06: qty 10

## 2023-07-06 MED ORDER — TICAGRELOR 90 MG PO TABS
90.0000 mg | ORAL_TABLET | ORAL | Status: DC
  Filled 2023-07-06: qty 1

## 2023-07-06 MED ORDER — FENTANYL CITRATE (PF) 250 MCG/5ML IJ SOLN
INTRAMUSCULAR | Status: DC | PRN
  Administered 2023-07-06 (×2): 50 ug via INTRAVENOUS

## 2023-07-06 MED ORDER — SODIUM CHLORIDE 0.9 % IV SOLN: INTRAVENOUS | Status: DC

## 2023-07-06 NOTE — Progress Notes (Signed)
PHARMACY - ANTICOAGULATION CONSULT NOTE  Pharmacy Consult:  Heparin Indication:  Post IR  Allergies  Allergen Reactions   Other Other (See Comments)    NO Blind Scopes with Naso Gastric tube.  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 2009 Also, a (name not recalled) chemo med caused PERMANENT NEUROPATHY and affected sense of taste   Adhesive [Tape]     Tape - burns - pls use paper tape or elastic bandage (Glue)   Aspirin Other (See Comments)    S/P gastric bypass surgery, states her MD told her to not take Aspirin.   Latex     Tape= burns   Nsaids Other (See Comments)    S/P gastric bypass-told not to take. GI bleeds with NSAIDS    Patient Measurements: Height: 5\' 7"  (170.2 cm) Weight: 89.8 kg (198 lb) IBW/kg (Calculated) : 61.6 Heparin Dosing Weight: 81kg  Vital Signs: Temp: 98.5 F (36.9 C) (11/06 2000) Temp Source: Oral (11/06 2000) BP: 124/54 (11/06 2200) Pulse Rate: 70 (11/06 2200)  Labs: Recent Labs    07/06/23 0701 07/06/23 2042  HGB 11.9*  --   HCT 37.8  --   PLT 341  --   APTT  --  42*  LABPROT 13.5  --   INR 1.0  --   HEPARINUNFRC  --  0.20*  CREATININE 1.40*  --     Estimated Creatinine Clearance: 36.3 mL/min (A) (by C-G formula based on SCr of 1.4 mg/dL (H)).    Assessment: 82 YOF stenosis of the L subclavian artery s/p revascularization with stent assisted angioplasty.  Pharmacy consulted to dose IV heparin.  Of note, patient has a history of Afib on Eliquis PTA, last dose 07/03/23.  Heparin level 0.2 (likely still some effects from apixaban) and aPTT 42 sec (slightly subtherapuetic for goal of 47-61.  Goal of Therapy:  Heparin level 0.1-0.25 units/ml, 47-61 sec Monitor platelets by anticoagulation protocol: Yes   Plan:  Increase heparin gtt slightly to 700 units/hr Discontinue heparin at 0800 on 11/7 (per order admin instructions) F/u further [facility] plans after sheath pulled in a.m.  Christoper Fabian, PharmD, BCPS Please see amion for complete clinical pharmacist  phone list 07/06/2023, 10:58 PM

## 2023-07-06 NOTE — Sedation Documentation (Signed)
ACT=176.

## 2023-07-06 NOTE — Progress Notes (Signed)
PHARMACY - ANTICOAGULATION CONSULT NOTE  Pharmacy Consult:  Heparin Indication:  Post IR  Allergies  Allergen Reactions   Other Other (See Comments)    NO Blind Scopes with Naso Gastric tube.  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 2009 Also, a (name not recalled) chemo med caused PERMANENT NEUROPATHY and affected sense of taste   Adhesive [Tape]     Tape - burns - pls use paper tape or elastic bandage (Glue)   Aspirin Other (See Comments)    S/P gastric bypass surgery, states her MD told her to not take Aspirin.   Latex     Tape= burns   Nsaids Other (See Comments)    S/P gastric bypass-told not to take. GI bleeds with NSAIDS    Patient Measurements: Height: 5\' 7"  (170.2 cm) Weight: 89.8 kg (198 lb) IBW/kg (Calculated) : 61.6 Heparin Dosing Weight: 81kg  Vital Signs: Temp: 98 F (36.7 C) (11/06 1230) Temp Source: Oral (11/06 1142) BP: 131/57 (11/06 1230) Pulse Rate: 73 (11/06 1215)  Labs: Recent Labs    07/06/23 0701  HGB 11.9*  HCT 37.8  PLT 341  LABPROT 13.5  INR 1.0  CREATININE 1.40*    Estimated Creatinine Clearance: 36.3 mL/min (A) (by C-G formula based on SCr of 1.4 mg/dL (H)).   Medical History: Past Medical History:  Diagnosis Date   Arthritis    Atrial tachycardia (HCC) 04/23/2017   CAD (coronary artery disease)    non obstructive   Cardiac pacemaker in situ - St Jude 05/13/2014   Permanent pacemaker for second-degree heart block. Procedure complicated by an atrial lead dislodgment and repeat procedure, 12/2013    Carotid stenosis    Carotid US 5/16:  Bilateral ICA 1-39%; L vertebral retrograde; L BP 126/49, R BP 140/57   Chronic combined systolic and diastolic CHF (congestive heart failure) (HCC)    Chronic lower back pain    Colon cancer (HCC)    a. s/p chemo   Colon polyps    Diabetic peripheral neuropathy (HCC)    DVT (deep venous thrombosis) (HCC) 2013   "twice behind knee on left side" (06/25/2013)   Glaucoma    History of chicken pox     Hyperlipidemia    Hypertension    Hypothyroidism    Iron deficiency anemia    Multinodular goiter    Osteoporosis    PAF (paroxysmal atrial fibrillation) (HCC)    Sleep apnea    Type II diabetes mellitus (HCC)       Assessment: 82 YOF stenosis of the L subclavian artery s/p revascularization with stent assisted angioplasty.  Pharmacy consulted to dose IV heparin.  Of note, patient has a history of Afib on Eliquis PTA, last dose 07/03/23.   Baseline labs reviewed  Goal of Therapy:  Heparin level 0.1-0.25 units/ml Monitor platelets by anticoagulation protocol: Yes   Plan:  Increase heparin gtt to 650 units/hr Check 8 hr heparin level and aPTT F/u [facility] plans in AM  Delaina Fetsch D. Laney Potash, PharmD, BCPS, BCCCP 07/06/2023, 12:39 PM

## 2023-07-06 NOTE — Anesthesia Procedure Notes (Signed)
Procedure Name: Intubation Date/Time: 07/06/2023 9:01 AM  Performed by: Sharyn Dross, CRNAPre-anesthesia Checklist: Patient identified, Emergency Drugs available, Suction available and Patient being monitored Patient Re-evaluated:Patient Re-evaluated prior to induction Oxygen Delivery Method: Circle system utilized Preoxygenation: Pre-oxygenation with 100% oxygen Induction Type: IV induction Ventilation: Mask ventilation without difficulty and Oral airway inserted - appropriate to patient size Laryngoscope Size: Mac and 4 Grade View: Grade I Tube type: Oral Tube size: 7.0 mm Number of attempts: 1 Airway Equipment and Method: Stylet and Oral airway Placement Confirmation: ETT inserted through vocal cords under direct vision, positive ETCO2 and breath sounds checked- equal and bilateral Secured at: 22 cm Tube secured with: Tape Dental Injury: Teeth and Oropharynx as per pre-operative assessment

## 2023-07-06 NOTE — TOC Benefit Eligibility Note (Signed)
Patient Product/process development scientist completed.    The patient is insured through [facility]. Patient has [employer], may use a copay card, and/or apply for patient assistance if available.    Ran test claim for Eliquis 5 mg and the current 30 day co-pay is $172.93 .   This test claim was processed through [facility]- copay amounts may vary at other pharmacies due to pharmacy/plan contracts, or as the patient moves through the different stages of their insurance plan.     Roland Earl, CPHT Pharmacy Technician III Certified Patient Advocate [facility] Pharm[facility] Patient Advocate Team Direct Number: [phone]  Fax: [phone]

## 2023-07-06 NOTE — H&P (Signed)
 Chief Complaint: Patient was seen in consultation today for Cerebral arteriogram with left subclavian artery angioplasty/stent at the request of Dr Lina Sayre   Supervising Physician: Julieanne Cotton  Patient Status: [facility] - Out-pt  History of Present Illness: Mary [name] is a 82 y.o. 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 Probable high-grade 80% stenosis of the proximal right ICA. High-grade stenosis of the non dominant left vertebrobasilar junction just proximal to the origin of the left posterior-inferior cerebellar artery. 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 Mild intracranial arteriosclerosis involving the left MCA M1 segment, and the left anterior cerebral artery proximal A1 segment.   Scheduled now for Left subclavian artery angioplasty/stent placement  Pt has permanent pacemaker--- dependent on same Representative requested    Past Medical History:  Diagnosis Date   Arthritis    Atrial tachycardia (HCC) 04/23/2017   CAD (coronary artery disease)    non obstructive   Cardiac pacemaker in situ - St Jude 05/13/2014   Permanent pacemaker for second-degree heart block. Procedure complicated by an atrial lead dislodgment and repeat procedure, 12/2013    Carotid stenosis    Carotid US 5/16:  Bilateral ICA 1-39%; L vertebral retrograde; L BP 126/49, R BP 140/57   Chronic combined systolic and diastolic CHF (congestive heart failure) (HCC)    Chronic lower back pain    Colon cancer (HCC)    a. s/p chemo   Colon polyps    Diabetic peripheral neuropathy (HCC)    DVT (deep venous thrombosis) (HCC) 2013   "twice behind knee on left side" (06/25/2013)   Glaucoma    History of chicken pox     Hyperlipidemia    Hypertension    Hypothyroidism    Iron deficiency anemia    Multinodular goiter    Osteoporosis    PAF (paroxysmal atrial fibrillation) (HCC)    Sleep apnea    Type II diabetes mellitus (HCC)     Past Surgical History:  Procedure Laterality Date   CARDIAC CATHETERIZATION  2006   Never had PCI, 3 caths total. Last one in [place]   CARDIAC CATHETERIZATION N/A 02/04/2015   Procedure: Left Heart Cath and Coronary Angiography;  Surgeon: Lyn Records, MD;  Location: [facility] INVASIVE CV LAB;  Service: Cardiovascular;  Laterality: N/A;   CATARACT EXTRACTION, BILATERAL Bilateral 2013   "and put stent in my left eye for glaucoma" (06/25/2013)   CHOLECYSTECTOMY  1980's   COLECTOMY  10/2010   Tumor removal   EYE MUSCLE SURGERY Bilateral ~ 1963   "muscles too long; eyes would go out and up; tied muscles to hold my eyes straight" (06/25/2013)   IR ANGIO INTRA EXTRACRAN SEL COM CAROTID INNOMINATE BILAT MOD SED  06/21/2023   IR ANGIO VERTEBRAL SEL VERTEBRAL UNI R MOD SED  06/21/2023   LEAD REVISION  01-03-2014   atrial lead revision by Dr Johney Frame   LEAD REVISION N/A 01/03/2014   Procedure: LEAD REVISION;  Surgeon: Gardiner Rhyme, MD;  Location: [facility] CATH LAB;  Service: Cardiovascular;  Laterality: N/A;   LEFT HEART CATH AND CORONARY ANGIOGRAPHY N/A 05/29/2018   Procedure: LEFT HEART CATH AND CORONARY ANGIOGRAPHY;  Surgeon: Lyn Records, MD;  Location: [facility] INVASIVE CV LAB;  Service: Cardiovascular;  Laterality: N/A;  PACEMAKER INSERTION  01/02/2014   STJ Assurity dual chamber pacemaker implnated by Dr Ladona Ridgel for SSS   PERMANENT PACEMAKER INSERTION N/A 01/02/2014   Procedure: PERMANENT PACEMAKER INSERTION;  Surgeon: Marinus Maw, MD;  Location: [facility] CATH LAB;  Service: Cardiovascular;  Laterality: N/A;   PORTACATH PLACEMENT Left 11/2010   PPM GENERATOR CHANGEOUT N/A 08/16/2022   Procedure: PPM GENERATOR CHANGEOUT;  Surgeon: Marinus Maw, MD;  Location: [facility] INVASIVE CV LAB;  Service:  Cardiovascular;  Laterality: N/A;   ROUX-EN-Y GASTRIC BYPASS  2009   ULNAR TUNNEL RELEASE Left ~ 2008   UMBILICAL HERNIA REPAIR  1970's?    (06/25/2013)    Allergies: Other, Adhesive [tape], Aspirin, Latex, and Nsaids  Medications: Prior to Admission medications   Medication Sig Start Date End Date Taking? Authorizing Provider  albuterol (VENTOLIN HFA) 108 (90 Base) MCG/ACT inhaler Inhale 1-2 puffs into the lungs every 6 (six) hours as needed for wheezing or shortness of breath. 10/22/21   Eustace Moore, MD  apixaban (ELIQUIS) 5 MG TABS tablet Take 1 tablet (5 mg total) by mouth 2 (two) times daily. 04/18/23   Marinus Maw, MD  aspirin EC 81 MG tablet Take 1 tablet (81 mg total) by mouth daily. Swallow whole. 06/01/23   Micki Riley, MD  atorvastatin (LIPITOR) 80 MG tablet TAKE 1 TABLET(80 MG) BY MOUTH DAILY Patient taking differently: Take 80 mg by mouth daily. 09/17/19   Myrlene Broker, MD  baclofen (LIORESAL) 10 MG tablet Take 1 tablet (10 mg total) by mouth daily as needed for muscle spasms. 02/25/22   Myrlene Broker, MD  Cyanocobalamin (VITAMIN B-12) 5000 MCG SUBL Place 5,000 mcg under the tongue daily.    [provider]  cyanocobalamin (VITAMIN B12) 1000 MCG/ML injection INJECT INTO THE MUSCLE EVERY 30 DAYS Patient taking differently: Inject 1,000 mcg into the muscle every 30 (thirty) days. 09/08/22   Myrlene Broker, MD  empagliflozin (JARDIANCE) 10 MG TABS tablet Take 1 tablet (10 mg total) by mouth daily before breakfast. 04/05/22   Lyn Records, MD  HYDROcodone-acetaminophen (NORCO/VICODIN) 5-325 MG tablet Take 1 tablet by mouth every 6 (six) hours as needed. Patient taking differently: Take 1 tablet by mouth every 6 (six) hours as needed for moderate pain (pain score 4-6). 09/27/22   Myrlene Broker, MD  insulin glargine (LANTUS) 100 UNIT/ML injection Inject 20 Units into the skin in the morning. Sliding Scale    [provider]   insulin lispro (HUMALOG) 100 UNIT/ML injection Inject 4 Units into the skin 4 (four) times daily as needed for high blood sugar. If blood sugar over 200.    [provider]  levothyroxine (SYNTHROID) 137 MCG tablet Take 1 tablet (137 mcg total) by mouth daily. 02/18/22   Myrlene Broker, MD  magnesium gluconate (MAGONATE) 500 MG tablet Take 500 mg by mouth daily.    [provider]  metoprolol tartrate (LOPRESSOR) 25 MG tablet Take 1 tablet (25 mg total) by mouth 2 (two) times daily. 04/22/23   Marinus Maw, MD  nitroGLYCERIN (NITROSTAT) 0.4 MG SL tablet Place 1 tablet (0.4 mg total) under the tongue every 5 (five) minutes as needed for chest pain. 04/09/19   Lyn Records, MD  Potassium Chloride ER 20 MEQ TBCR Take 1 tablet (20 mEq total) by mouth 2 (two) times daily. Patient taking differently: Take 40 mEq by mouth 2 (two) times daily. 05/04/23   Croitoru, Rachelle Hora, MD  spironolactone (  ALDACTONE) 25 MG tablet TAKE 1/2 TABLET BY MOUTH DAILY **MUST KEEP APRIL APPOINTMENT FOR FURTHER REFILLS. FINAL ATTEMPT** 04/12/23   Croitoru, Rachelle Hora, MD  ticagrelor (BRILINTA) 90 MG TABS tablet Take 1 tablet (90 mg total) by mouth 2 (two) times daily. Begin taking medication 5 days prior to procedure. 06/27/23 09/25/23  Mickie Kay, NP  torsemide (DEMADEX) 20 MG tablet If you weigh 200 pounds or less, take 20 mg twice a day. If you weigh over 200 pounds, take 40 mg in the morning and 20 mg in the afternoon 05/11/23   Croitoru, Mihai, MD  Vitamin D, Ergocalciferol, (DRISDOL) 1.25 MG (50000 UNIT) CAPS capsule TAKE 1 CAPSULE BY MOUTH EVERY 7 DAYS Patient taking differently: Take 50,000 Units by mouth every Wednesday. 04/12/22   Myrlene Broker, MD     Family History  Problem Relation Age of Onset   Breast cancer Mother    Diabetes Mother    Heart disease Father    Heart attack Father    Heart attack Sister    Heart attack Brother    Bladder Cancer Brother    Prostate cancer Brother     Colon polyps Brother    Breast cancer Sister    Breast cancer Maternal Aunt        x 2 aunts   Pancreatic cancer Brother    Colon polyps Brother    Bladder Cancer Cousin    Clotting disorder Sister    Diabetes Sister        x 3   Diabetes Maternal Grandmother     Social History   Socioeconomic History   Marital status: Married    Spouse name: Chrissie Noa   Number of children: 0   Years of education: Not on file   Highest education level: Not on file  Occupational History   Occupation: Retired    Comment: office work  Tobacco Use   Smoking status: Former    Current packs/day: 0.00    Average packs/day: 1 pack/day for 30.0 years (30.0 ttl pk-yrs)    Types: Cigarettes    Start date: 02/1987    Quit date: 02/2017    Years since quitting: 6.3   Smokeless tobacco: Never   Tobacco comments:    started in her [age] until 2009   Vaping Use   Vaping status: Never Used  Substance and Sexual Activity   Alcohol use: Not Currently    Alcohol/week: 0.0 standard drinks of alcohol   Drug use: No   Sexual activity: Not on file  Other Topics Concern   Not on file  Social History Narrative   Married to husband, Chrissie Noa   No children   Retired Hospital doctor to Electrical engineer   Recently moved back here from [place] [employer] Strain: Low Risk  (12/01/2017)   Overall Financial Resource Strain (CARDIA)    Difficulty of Paying Living Expenses: Not very hard  Food Insecurity: No Food Insecurity (12/05/2022)   Hunger Vital Sign    Worried About Running Out of Food in the Last Year: Never true    Ran Out of Food in the Last Year: Never true  Transportation Needs: No Transportation Needs (12/05/2022)   PRAPARE - Administrator, Civil Service (Medical): No    Lack of Transportation (Non-Medical): No  Physical Activity: Inactive (12/01/2017)   Exercise Vital Sign    Days of Exercise per Week: 0  days    Minutes of  Exercise per Session: 0 min  Stress: No Stress Concern Present (05/23/2019)   [employer] of Occupational Health - Occupational Stress Questionnaire    Feeling of Stress : Not at all  Social Connections: Unknown (08/25/2022)   Received from [facility]   Social Network    Social Network: Not on file    Review of Systems: A 12 point ROS discussed and pertinent positives are indicated in the HPI above.  All other systems are negative.  Review of Systems  Constitutional:  Negative for activity change, fatigue and fever.  HENT:  Negative for tinnitus and trouble swallowing.   Respiratory:  Negative for cough and shortness of breath.   Cardiovascular:  Negative for chest pain.  Gastrointestinal:  Negative for abdominal pain.  Musculoskeletal:  Negative for back pain and gait problem.  Neurological:  Positive for dizziness and headaches. Negative for tremors, seizures, syncope, facial asymmetry, speech difficulty, weakness, light-headedness and numbness.  Psychiatric/Behavioral:  Negative for behavioral problems and confusion.     Vital Signs: There were no vitals taken for this visit.  Advance Care Plan: The advanced care plan/surrogate decision maker was discussed at the time of visit and documented in the medical record.    Physical Exam HENT:     Mouth/Throat:     Mouth: Mucous membranes are moist.  Eyes:     Extraocular Movements: Extraocular movements intact.  Cardiovascular:     Rate and Rhythm: Normal rate and regular rhythm.     Heart sounds: No murmur heard. Pulmonary:     Effort: Pulmonary effort is normal.     Breath sounds: Normal breath sounds. No wheezing.  Abdominal:     General: Abdomen is flat.  Musculoskeletal:        General: Normal range of motion.     Right lower leg: No edema.     Left lower leg: No edema.  Skin:    General: Skin is warm.  Neurological:     Mental Status: She is alert and oriented to person, place, and time.   Psychiatric:        Behavior: Behavior normal.        Thought Content: Thought content normal.        Judgment: Judgment normal.     Imaging: IR ANGIO INTRA EXTRACRAN SEL COM CAROTID INNOMINATE BILAT MOD SED  Result Date: 06/27/2023 CLINICAL DATA:  History of sudden severe headaches and dizziness. CTA of the head and neck demonstrating severe stenosis of the proximal left subclavian artery, and occlusion of the left vertebral artery. High-grade stenosis of the right internal carotid artery proximally. EXAM: BILATERAL COMMON CAROTID AND INNOMINATE ANGIOGRAPHY COMPARISON:  None Available. MEDICATIONS: Heparin 1000 units IV. No antibiotic was administered within 1 hour of the procedure. ANESTHESIA/SEDATION: Versed 1 mg IV; Fentanyl 50 mcg IV Moderate Sedation Time:  57 minutes The patient was continuously monitored during the procedure by the interventional radiology nurse under my direct supervision. CONTRAST:  Omnipaque 300 approximately 75 mL. FLUOROSCOPY TIME:  Fluoroscopy Time: 14 minutes 18 seconds (861 mGy). COMPLICATIONS: None immediate. TECHNIQUE: Informed written consent was obtained from the patient after a thorough discussion of the procedural risks, benefits and alternatives. All questions were addressed. Maximal Sterile Barrier Technique was utilized including caps, mask, sterile gowns, sterile gloves, sterile drape, hand hygiene and skin antiseptic. A timeout was performed prior to the initiation of the procedure. The left groin was prepped and draped in the  usual sterile fashion. Thereafter using modified Seldinger technique, transfemoral access into the left common femoral artery was obtained without difficulty. Over an 0.035 inch guidewire, a 5 French Pinnacle sheath was inserted. Through this, and also over an 0.035 inch guidewire, a 5 [place] JB 1 catheter was advanced to the aortic arch region and selectively positioned in the right common carotid artery, the right vertebral artery, the  left common carotid artery and the left subclavian artery. FINDINGS: The dominant right vertebral artery origin is mildly narrowed. The vessel is seen to opacify to the cranial skull base. Wide patency is seen of the right vertebrobasilar junction and the right posterior-inferior cerebellar artery. There is retrograde opacification of the non dominant left vertebral basilar junction with retrograde flow into the left vertebral artery consistent with left subclavian steal. Severe high-grade stenosis is seen of the left vertebrobasilar junction just proximal to the origin of the left posterior-inferior cerebellar artery. The basilar artery, the posterior cerebral arteries, the superior cerebellar arteries and the anterior-inferior cerebellar arteries demonstrate opacification into the capillary and venous phases. The right common carotid arteriogram demonstrates the right external carotid artery and its branches to be widely patent. The right internal carotid artery at the bulb has a circumferential atherosclerotic plaque with a high-grade stenosis noted on the lateral projection of 80%. More distally, the vessel is seen to opacify to the cranial skull base. The petrous, the cavernous and the supraclinoid right ICA are widely patent. An approximately 2.5 mm wide neck outpouching is evident arising from the posterior wall of the right internal carotid artery just proximal to the origin of the ophthalmic artery. The right middle cerebral artery and the right anterior cerebral artery opacify into the capillary and venous phases. The left common carotid arteriogram demonstrates the left external carotid artery and its major branches to be widely patent. The left internal carotid artery at the bulb to the cranial skull base is widely patent with approximately 50% stenosis just proximal to the cervical petrous junction. More distally, there is a moderate stenosis at the petrous cavernous junction associated with a small 4.6  mm x 3.3 mm saccular aneurysm. Distal to this the distal cavernous and the supraclinoid left ICA are widely patent. The left middle cerebral artery proximally has a mild stenosis. More distally, the trifurcation branches are widely patent. The left anterior cerebral artery proximally also has a mild stenosis. More distally, the vessel is seen to opacify normally into the capillary and venous phases. Left subclavian arteriogram demonstrates high-grade stenosis proximally with post stenotic dilatation secondary to a circumferential atherosclerotic disease. No antegrade flow of the left vertebral artery is noted. 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 Probable high-grade 80% stenosis of the proximal right ICA. High-grade stenosis of the non dominant left vertebrobasilar junction just proximal to the origin of the left posterior-inferior cerebellar artery. 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 Mild intracranial arteriosclerosis involving the left MCA M1 segment, and the left anterior cerebral artery proximal A1 segment. PLAN: Findings reviewed with patient and the spouse. Given the significant stenosis with associated left subclavian steal endovascular treatment of the left subclavian artery stenosis proximally was discussed briefly with patient and the spouse. The left subclavian revascularization procedure was reviewed with both the patient and the spouse. This would be under general anesthesia via a left common femoral approach. The patient will be started  on dual antiplatelets 7 days prior to procedure. Postprocedure, the patient would spend an overnight stay in the neuro ICU for close neurologic observations, and blood pressure management. Questions were answered to their satisfaction. Both the patient and the spouse are agreeable to endovascular  revascularization of the severe left subclavian artery stenosis. This will be scheduled at the earliest possible. Electronically Signed   By: Julieanne Cotton M.D.   On: 06/27/2023 08:00   IR ANGIO VERTEBRAL SEL VERTEBRAL UNI R MOD SED  Result Date: 06/27/2023 CLINICAL DATA:  History of sudden severe headaches and dizziness. CTA of the head and neck demonstrating severe stenosis of the proximal left subclavian artery, and occlusion of the left vertebral artery. High-grade stenosis of the right internal carotid artery proximally. EXAM: BILATERAL COMMON CAROTID AND INNOMINATE ANGIOGRAPHY COMPARISON:  None Available. MEDICATIONS: Heparin 1000 units IV. No antibiotic was administered within 1 hour of the procedure. ANESTHESIA/SEDATION: Versed 1 mg IV; Fentanyl 50 mcg IV Moderate Sedation Time:  57 minutes The patient was continuously monitored during the procedure by the interventional radiology nurse under my direct supervision. CONTRAST:  Omnipaque 300 approximately 75 mL. FLUOROSCOPY TIME:  Fluoroscopy Time: 14 minutes 18 seconds (861 mGy). COMPLICATIONS: None immediate. TECHNIQUE: Informed written consent was obtained from the patient after a thorough discussion of the procedural risks, benefits and alternatives. All questions were addressed. Maximal Sterile Barrier Technique was utilized including caps, mask, sterile gowns, sterile gloves, sterile drape, hand hygiene and skin antiseptic. A timeout was performed prior to the initiation of the procedure. The left groin was prepped and draped in the usual sterile fashion. Thereafter using modified Seldinger technique, transfemoral access into the left common femoral artery was obtained without difficulty. Over an 0.035 inch guidewire, a 5 French Pinnacle sheath was inserted. Through this, and also over an 0.035 inch guidewire, a 5 [place] JB 1 catheter was advanced to the aortic arch region and selectively positioned in the right common carotid artery, the right  vertebral artery, the left common carotid artery and the left subclavian artery. FINDINGS: The dominant right vertebral artery origin is mildly narrowed. The vessel is seen to opacify to the cranial skull base. Wide patency is seen of the right vertebrobasilar junction and the right posterior-inferior cerebellar artery. There is retrograde opacification of the non dominant left vertebral basilar junction with retrograde flow into the left vertebral artery consistent with left subclavian steal. Severe high-grade stenosis is seen of the left vertebrobasilar junction just proximal to the origin of the left posterior-inferior cerebellar artery. The basilar artery, the posterior cerebral arteries, the superior cerebellar arteries and the anterior-inferior cerebellar arteries demonstrate opacification into the capillary and venous phases. The right common carotid arteriogram demonstrates the right external carotid artery and its branches to be widely patent. The right internal carotid artery at the bulb has a circumferential atherosclerotic plaque with a high-grade stenosis noted on the lateral projection of 80%. More distally, the vessel is seen to opacify to the cranial skull base. The petrous, the cavernous and the supraclinoid right ICA are widely patent. An approximately 2.5 mm wide neck outpouching is evident arising from the posterior wall of the right internal carotid artery just proximal to the origin of the ophthalmic artery. The right middle cerebral artery and the right anterior cerebral artery opacify into the capillary and venous phases. The left common carotid arteriogram demonstrates the left external carotid artery and its major branches to be widely patent. The left internal carotid artery at the bulb to  the cranial skull base is widely patent with approximately 50% stenosis just proximal to the cervical petrous junction. More distally, there is a moderate stenosis at the petrous cavernous junction  associated with a small 4.6 mm x 3.3 mm saccular aneurysm. Distal to this the distal cavernous and the supraclinoid left ICA are widely patent. The left middle cerebral artery proximally has a mild stenosis. More distally, the trifurcation branches are widely patent. The left anterior cerebral artery proximally also has a mild stenosis. More distally, the vessel is seen to opacify normally into the capillary and venous phases. Left subclavian arteriogram demonstrates high-grade stenosis proximally with post stenotic dilatation secondary to a circumferential atherosclerotic disease. No antegrade flow of the left vertebral artery is noted. 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 Probable high-grade 80% stenosis of the proximal right ICA. High-grade stenosis of the non dominant left vertebrobasilar junction just proximal to the origin of the left posterior-inferior cerebellar artery. 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 Mild intracranial arteriosclerosis involving the left MCA M1 segment, and the left anterior cerebral artery proximal A1 segment. PLAN: Findings reviewed with patient and the spouse. Given the significant stenosis with associated left subclavian steal endovascular treatment of the left subclavian artery stenosis proximally was discussed briefly with patient and the spouse. The left subclavian revascularization procedure was reviewed with both the patient and the spouse. This would be under general anesthesia via a left common femoral approach. The patient will be started on dual antiplatelets 7 days prior to procedure. Postprocedure, the patient would spend an overnight stay in the neuro ICU for close neurologic observations, and blood pressure management. Questions were answered to their satisfaction. Both the patient and the spouse are  agreeable to endovascular revascularization of the severe left subclavian artery stenosis. This will be scheduled at the earliest possible. Electronically Signed   By: Julieanne Cotton M.D.   On: 06/27/2023 08:00    Labs:  CBC: Recent Labs    12/04/22 2243 12/06/22 0028 05/30/23 1451 06/21/23 0809  WBC 8.3 12.4* 9.3 9.8  HGB 12.7 12.2 12.4 12.8  HCT 39.8 37.9 38.7 40.4  PLT 332 291 329 338    COAGS: Recent Labs    06/21/23 0809  INR 1.0    BMP: Recent Labs    12/04/22 2243 12/06/22 0028 05/30/23 1451 06/21/23 0809  NA 134* 136 136 138  K 4.2 4.3 3.7 3.7  CL 99 97* 96* 94*  CO2 26 28 27 27   GLUCOSE 124* 160* 103* 139*  BUN 27* 25* 21 21  CALCIUM 8.8* 8.9 8.8* 10.1  CREATININE 1.48* 1.53* 1.38* 1.30*  GFRNONAA 35* 34* 38* 41*    LIVER FUNCTION TESTS: Recent Labs    09/27/22 1528 12/04/22 2243  BILITOT 0.4 0.6  AST 11 15  ALT 6 8  ALKPHOS 96 98  PROT 7.5 7.6  ALBUMIN 4.0 3.8    TUMOR MARKERS: No results for input(s): "AFPTM", "CEA", "CA199", "CHROMGRNA" in the last 8760 hours.  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  This interventional procedure involves the use of X-rays and because of the nature of the planned procedure, it is possible that  we will have prolonged use of X-ray fluoroscopy.  Potential radiation risks to you include (but are not limited to) the following: - A slightly elevated risk for cancer  several years later in life. This risk is typically less than 0.5% percent. This risk is low in comparison to the normal incidence of human cancer, which is 33% for women and 50% for men according to the [employer]. - Radiation induced injury can  include skin redness, resembling a rash, tissue breakdown / ulcers and hair loss (which can be temporary or permanent).   The likelihood of either of these occurring depends on the difficulty of the procedure and whether you are sensitive to radiation due to previous procedures, disease, or genetic conditions.   IF your procedure requires a prolonged use of radiation, you will be notified and given written instructions for further action.  It is your responsibility to monitor the irradiated area for the 2 weeks following the procedure and to notify your physician if you are concerned that you have suffered a radiation induced injury.    All of the patient's questions were answered, patient is agreeable to proceed. Consent signed and in chart.  Pt is aware if intervention is performed she will be admitted to Neuro ICU overnight--- planned for Discharge in am if stable   Thank you for this interesting consult.  I greatly enjoyed meeting Mary [name] and look forward to participating in their care.  A copy of this report was sent to the requesting provider on this date.  Electronically Signed: Robet Leu, PA-C 07/06/2023, 7:07 AM   I spent a total of    25 Minutes in face to face in clinical consultation, greater than 50% of which was counseling/coordinating care for L subclavian artery angioplasty/stent

## 2023-07-06 NOTE — H&P (Deleted)
Chief Complaint: Patient was seen in consultation today for Cerebral arteriogram with left subclavian artery angioplasty/stent at the request of Dr Lina Sayre   Supervising Physician: Julieanne Cotton  Patient Status: [facility] - Out-pt  History of Present Illness: Mary [name] is a 82 y.o. 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 Probable high-grade 80% stenosis of the proximal right ICA. High-grade stenosis of the non dominant left vertebrobasilar junction just proximal to the origin of the left posterior-inferior cerebellar artery. 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 Mild intracranial arteriosclerosis involving the left MCA M1 segment, and the left anterior cerebral artery proximal A1 segment.   Scheduled now for Left subclavian artery angioplasty/stent placement  Pt has permanent pacemaker--- dependent on same Representative requested    Past Medical History:  Diagnosis Date   Arthritis    Atrial tachycardia (HCC) 04/23/2017   CAD (coronary artery disease)    non obstructive   Cardiac pacemaker in situ - St Jude 05/13/2014   Permanent pacemaker for second-degree heart block. Procedure complicated by an atrial lead dislodgment and repeat procedure, 12/2013    Carotid stenosis    Carotid US 5/16:  Bilateral ICA 1-39%; L vertebral retrograde; L BP 126/49, R BP 140/57   Chronic combined systolic and diastolic CHF (congestive heart failure) (HCC)    Chronic lower back pain    Colon cancer (HCC)    a. s/p chemo   Colon polyps    Diabetic peripheral neuropathy (HCC)    DVT (deep venous thrombosis) (HCC) 2013   "twice behind knee on left side" (06/25/2013)   Glaucoma    History of chicken pox     Hyperlipidemia    Hypertension    Hypothyroidism    Iron deficiency anemia    Multinodular goiter    Osteoporosis    PAF (paroxysmal atrial fibrillation) (HCC)    Sleep apnea    Type II diabetes mellitus (HCC)     Past Surgical History:  Procedure Laterality Date   CARDIAC CATHETERIZATION  2006   Never had PCI, 3 caths total. Last one in [place]   CARDIAC CATHETERIZATION N/A 02/04/2015   Procedure: Left Heart Cath and Coronary Angiography;  Surgeon: Lyn Records, MD;  Location: [facility] INVASIVE CV LAB;  Service: Cardiovascular;  Laterality: N/A;   CATARACT EXTRACTION, BILATERAL Bilateral 2013   "and put stent in my left eye for glaucoma" (06/25/2013)   CHOLECYSTECTOMY  1980's   COLECTOMY  10/2010   Tumor removal   EYE MUSCLE SURGERY Bilateral ~ 1963   "muscles too long; eyes would go out and up; tied muscles to hold my eyes straight" (06/25/2013)   IR ANGIO INTRA EXTRACRAN SEL COM CAROTID INNOMINATE BILAT MOD SED  06/21/2023   IR ANGIO VERTEBRAL SEL VERTEBRAL UNI R MOD SED  06/21/2023   LEAD REVISION  01-03-2014   atrial lead revision by Dr Johney Frame   LEAD REVISION N/A 01/03/2014   Procedure: LEAD REVISION;  Surgeon: Gardiner Rhyme, MD;  Location: [facility] CATH LAB;  Service: Cardiovascular;  Laterality: N/A;   LEFT HEART CATH AND CORONARY ANGIOGRAPHY N/A 05/29/2018   Procedure: LEFT HEART CATH AND CORONARY ANGIOGRAPHY;  Surgeon: Lyn Records, MD;  Location: [facility] INVASIVE CV LAB;  Service: Cardiovascular;  Laterality: N/A;  PACEMAKER INSERTION  01/02/2014   STJ Assurity dual chamber pacemaker implnated by Dr Ladona Ridgel for SSS   PERMANENT PACEMAKER INSERTION N/A 01/02/2014   Procedure: PERMANENT PACEMAKER INSERTION;  Surgeon: Marinus Maw, MD;  Location: [facility] CATH LAB;  Service: Cardiovascular;  Laterality: N/A;   PORTACATH PLACEMENT Left 11/2010   PPM GENERATOR CHANGEOUT N/A 08/16/2022   Procedure: PPM GENERATOR CHANGEOUT;  Surgeon: Marinus Maw, MD;  Location: [facility] INVASIVE CV LAB;  Service:  Cardiovascular;  Laterality: N/A;   ROUX-EN-Y GASTRIC BYPASS  2009   ULNAR TUNNEL RELEASE Left ~ 2008   UMBILICAL HERNIA REPAIR  1970's?    (06/25/2013)    Allergies: Other, Adhesive [tape], Aspirin, Latex, and Nsaids  Medications: Prior to Admission medications   Medication Sig Start Date End Date Taking? Authorizing Provider  albuterol (VENTOLIN HFA) 108 (90 Base) MCG/ACT inhaler Inhale 1-2 puffs into the lungs every 6 (six) hours as needed for wheezing or shortness of breath. 10/22/21   Eustace Moore, MD  apixaban (ELIQUIS) 5 MG TABS tablet Take 1 tablet (5 mg total) by mouth 2 (two) times daily. 04/18/23   Marinus Maw, MD  aspirin EC 81 MG tablet Take 1 tablet (81 mg total) by mouth daily. Swallow whole. 06/01/23   Micki Riley, MD  atorvastatin (LIPITOR) 80 MG tablet TAKE 1 TABLET(80 MG) BY MOUTH DAILY Patient taking differently: Take 80 mg by mouth daily. 09/17/19   Myrlene Broker, MD  baclofen (LIORESAL) 10 MG tablet Take 1 tablet (10 mg total) by mouth daily as needed for muscle spasms. 02/25/22   Myrlene Broker, MD  Cyanocobalamin (VITAMIN B-12) 5000 MCG SUBL Place 5,000 mcg under the tongue daily.    [provider]  cyanocobalamin (VITAMIN B12) 1000 MCG/ML injection INJECT INTO THE MUSCLE EVERY 30 DAYS Patient taking differently: Inject 1,000 mcg into the muscle every 30 (thirty) days. 09/08/22   Myrlene Broker, MD  empagliflozin (JARDIANCE) 10 MG TABS tablet Take 1 tablet (10 mg total) by mouth daily before breakfast. 04/05/22   Lyn Records, MD  HYDROcodone-acetaminophen (NORCO/VICODIN) 5-325 MG tablet Take 1 tablet by mouth every 6 (six) hours as needed. Patient taking differently: Take 1 tablet by mouth every 6 (six) hours as needed for moderate pain (pain score 4-6). 09/27/22   Myrlene Broker, MD  insulin glargine (LANTUS) 100 UNIT/ML injection Inject 20 Units into the skin in the morning. Sliding Scale    [provider]   insulin lispro (HUMALOG) 100 UNIT/ML injection Inject 4 Units into the skin 4 (four) times daily as needed for high blood sugar. If blood sugar over 200.    [provider]  levothyroxine (SYNTHROID) 137 MCG tablet Take 1 tablet (137 mcg total) by mouth daily. 02/18/22   Myrlene Broker, MD  magnesium gluconate (MAGONATE) 500 MG tablet Take 500 mg by mouth daily.    [provider]  metoprolol tartrate (LOPRESSOR) 25 MG tablet Take 1 tablet (25 mg total) by mouth 2 (two) times daily. 04/22/23   Marinus Maw, MD  nitroGLYCERIN (NITROSTAT) 0.4 MG SL tablet Place 1 tablet (0.4 mg total) under the tongue every 5 (five) minutes as needed for chest pain. 04/09/19   Lyn Records, MD  Potassium Chloride ER 20 MEQ TBCR Take 1 tablet (20 mEq total) by mouth 2 (two) times daily. Patient taking differently: Take 40 mEq by mouth 2 (two) times daily. 05/04/23   Croitoru, Rachelle Hora, MD  spironolactone (  ALDACTONE) 25 MG tablet TAKE 1/2 TABLET BY MOUTH DAILY **MUST KEEP APRIL APPOINTMENT FOR FURTHER REFILLS. FINAL ATTEMPT** 04/12/23   Croitoru, Rachelle Hora, MD  ticagrelor (BRILINTA) 90 MG TABS tablet Take 1 tablet (90 mg total) by mouth 2 (two) times daily. Begin taking medication 5 days prior to procedure. 06/27/23 09/25/23  Mickie Kay, NP  torsemide (DEMADEX) 20 MG tablet If you weigh 200 pounds or less, take 20 mg twice a day. If you weigh over 200 pounds, take 40 mg in the morning and 20 mg in the afternoon 05/11/23   Croitoru, Mihai, MD  Vitamin D, Ergocalciferol, (DRISDOL) 1.25 MG (50000 UNIT) CAPS capsule TAKE 1 CAPSULE BY MOUTH EVERY 7 DAYS Patient taking differently: Take 50,000 Units by mouth every Wednesday. 04/12/22   Myrlene Broker, MD     Family History  Problem Relation Age of Onset   Breast cancer Mother    Diabetes Mother    Heart disease Father    Heart attack Father    Heart attack Sister    Heart attack Brother    Bladder Cancer Brother    Prostate cancer Brother     Colon polyps Brother    Breast cancer Sister    Breast cancer Maternal Aunt        x 2 aunts   Pancreatic cancer Brother    Colon polyps Brother    Bladder Cancer Cousin    Clotting disorder Sister    Diabetes Sister        x 3   Diabetes Maternal Grandmother     Social History   Socioeconomic History   Marital status: Married    Spouse name: Chrissie Noa   Number of children: 0   Years of education: Not on file   Highest education level: Not on file  Occupational History   Occupation: Retired    Comment: office work  Tobacco Use   Smoking status: Former    Current packs/day: 0.00    Average packs/day: 1 pack/day for 30.0 years (30.0 ttl pk-yrs)    Types: Cigarettes    Start date: 02/1987    Quit date: 02/2017    Years since quitting: 6.3   Smokeless tobacco: Never   Tobacco comments:    started in her [age] until 2009   Vaping Use   Vaping status: Never Used  Substance and Sexual Activity   Alcohol use: Not Currently    Alcohol/week: 0.0 standard drinks of alcohol   Drug use: No   Sexual activity: Not on file  Other Topics Concern   Not on file  Social History Narrative   Married to husband, Chrissie Noa   No children   Retired Hospital doctor to Electrical engineer   Recently moved back here from [place] [employer] Strain: Low Risk  (12/01/2017)   Overall Financial Resource Strain (CARDIA)    Difficulty of Paying Living Expenses: Not very hard  Food Insecurity: No Food Insecurity (12/05/2022)   Hunger Vital Sign    Worried About Running Out of Food in the Last Year: Never true    Ran Out of Food in the Last Year: Never true  Transportation Needs: No Transportation Needs (12/05/2022)   PRAPARE - Administrator, Civil Service (Medical): No    Lack of Transportation (Non-Medical): No  Physical Activity: Inactive (12/01/2017)   Exercise Vital Sign    Days of Exercise per Week: 0  days    Minutes of  Exercise per Session: 0 min  Stress: No Stress Concern Present (05/23/2019)   [employer] of Occupational Health - Occupational Stress Questionnaire    Feeling of Stress : Not at all  Social Connections: Unknown (08/25/2022)   Received from [facility]   Social Network    Social Network: Not on file    Review of Systems: A 12 point ROS discussed and pertinent positives are indicated in the HPI above.  All other systems are negative.  Review of Systems  Constitutional:  Negative for activity change, fatigue and fever.  HENT:  Negative for tinnitus and trouble swallowing.   Respiratory:  Negative for cough and shortness of breath.   Cardiovascular:  Negative for chest pain.  Gastrointestinal:  Negative for abdominal pain.  Musculoskeletal:  Negative for back pain and gait problem.  Neurological:  Positive for dizziness and headaches. Negative for tremors, seizures, syncope, facial asymmetry, speech difficulty, weakness, light-headedness and numbness.  Psychiatric/Behavioral:  Negative for behavioral problems and confusion.     Vital Signs: There were no vitals taken for this visit.  Advance Care Plan: The advanced care plan/surrogate decision maker was discussed at the time of visit and documented in the medical record.    Physical Exam HENT:     Mouth/Throat:     Mouth: Mucous membranes are moist.  Eyes:     Extraocular Movements: Extraocular movements intact.  Cardiovascular:     Rate and Rhythm: Normal rate and regular rhythm.     Heart sounds: No murmur heard. Pulmonary:     Effort: Pulmonary effort is normal.     Breath sounds: Normal breath sounds. No wheezing.  Abdominal:     General: Abdomen is flat.  Musculoskeletal:        General: Normal range of motion.     Right lower leg: No edema.     Left lower leg: No edema.  Skin:    General: Skin is warm.  Neurological:     Mental Status: She is alert and oriented to person, place, and time.   Psychiatric:        Behavior: Behavior normal.        Thought Content: Thought content normal.        Judgment: Judgment normal.     Imaging: IR ANGIO INTRA EXTRACRAN SEL COM CAROTID INNOMINATE BILAT MOD SED  Result Date: 06/27/2023 CLINICAL DATA:  History of sudden severe headaches and dizziness. CTA of the head and neck demonstrating severe stenosis of the proximal left subclavian artery, and occlusion of the left vertebral artery. High-grade stenosis of the right internal carotid artery proximally. EXAM: BILATERAL COMMON CAROTID AND INNOMINATE ANGIOGRAPHY COMPARISON:  None Available. MEDICATIONS: Heparin 1000 units IV. No antibiotic was administered within 1 hour of the procedure. ANESTHESIA/SEDATION: Versed 1 mg IV; Fentanyl 50 mcg IV Moderate Sedation Time:  57 minutes The patient was continuously monitored during the procedure by the interventional radiology nurse under my direct supervision. CONTRAST:  Omnipaque 300 approximately 75 mL. FLUOROSCOPY TIME:  Fluoroscopy Time: 14 minutes 18 seconds (861 mGy). COMPLICATIONS: None immediate. TECHNIQUE: Informed written consent was obtained from the patient after a thorough discussion of the procedural risks, benefits and alternatives. All questions were addressed. Maximal Sterile Barrier Technique was utilized including caps, mask, sterile gowns, sterile gloves, sterile drape, hand hygiene and skin antiseptic. A timeout was performed prior to the initiation of the procedure. The left groin was prepped and draped in the  usual sterile fashion. Thereafter using modified Seldinger technique, transfemoral access into the left common femoral artery was obtained without difficulty. Over an 0.035 inch guidewire, a 5 French Pinnacle sheath was inserted. Through this, and also over an 0.035 inch guidewire, a 5 [place] JB 1 catheter was advanced to the aortic arch region and selectively positioned in the right common carotid artery, the right vertebral artery, the  left common carotid artery and the left subclavian artery. FINDINGS: The dominant right vertebral artery origin is mildly narrowed. The vessel is seen to opacify to the cranial skull base. Wide patency is seen of the right vertebrobasilar junction and the right posterior-inferior cerebellar artery. There is retrograde opacification of the non dominant left vertebral basilar junction with retrograde flow into the left vertebral artery consistent with left subclavian steal. Severe high-grade stenosis is seen of the left vertebrobasilar junction just proximal to the origin of the left posterior-inferior cerebellar artery. The basilar artery, the posterior cerebral arteries, the superior cerebellar arteries and the anterior-inferior cerebellar arteries demonstrate opacification into the capillary and venous phases. The right common carotid arteriogram demonstrates the right external carotid artery and its branches to be widely patent. The right internal carotid artery at the bulb has a circumferential atherosclerotic plaque with a high-grade stenosis noted on the lateral projection of 80%. More distally, the vessel is seen to opacify to the cranial skull base. The petrous, the cavernous and the supraclinoid right ICA are widely patent. An approximately 2.5 mm wide neck outpouching is evident arising from the posterior wall of the right internal carotid artery just proximal to the origin of the ophthalmic artery. The right middle cerebral artery and the right anterior cerebral artery opacify into the capillary and venous phases. The left common carotid arteriogram demonstrates the left external carotid artery and its major branches to be widely patent. The left internal carotid artery at the bulb to the cranial skull base is widely patent with approximately 50% stenosis just proximal to the cervical petrous junction. More distally, there is a moderate stenosis at the petrous cavernous junction associated with a small 4.6  mm x 3.3 mm saccular aneurysm. Distal to this the distal cavernous and the supraclinoid left ICA are widely patent. The left middle cerebral artery proximally has a mild stenosis. More distally, the trifurcation branches are widely patent. The left anterior cerebral artery proximally also has a mild stenosis. More distally, the vessel is seen to opacify normally into the capillary and venous phases. Left subclavian arteriogram demonstrates high-grade stenosis proximally with post stenotic dilatation secondary to a circumferential atherosclerotic disease. No antegrade flow of the left vertebral artery is noted. 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 Probable high-grade 80% stenosis of the proximal right ICA. High-grade stenosis of the non dominant left vertebrobasilar junction just proximal to the origin of the left posterior-inferior cerebellar artery. 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 Mild intracranial arteriosclerosis involving the left MCA M1 segment, and the left anterior cerebral artery proximal A1 segment. PLAN: Findings reviewed with patient and the spouse. Given the significant stenosis with associated left subclavian steal endovascular treatment of the left subclavian artery stenosis proximally was discussed briefly with patient and the spouse. The left subclavian revascularization procedure was reviewed with both the patient and the spouse. This would be under general anesthesia via a left common femoral approach. The patient will be started  on dual antiplatelets 7 days prior to procedure. Postprocedure, the patient would spend an overnight stay in the neuro ICU for close neurologic observations, and blood pressure management. Questions were answered to their satisfaction. Both the patient and the spouse are agreeable to endovascular  revascularization of the severe left subclavian artery stenosis. This will be scheduled at the earliest possible. Electronically Signed   By: Julieanne Cotton M.D.   On: 06/27/2023 08:00   IR ANGIO VERTEBRAL SEL VERTEBRAL UNI R MOD SED  Result Date: 06/27/2023 CLINICAL DATA:  History of sudden severe headaches and dizziness. CTA of the head and neck demonstrating severe stenosis of the proximal left subclavian artery, and occlusion of the left vertebral artery. High-grade stenosis of the right internal carotid artery proximally. EXAM: BILATERAL COMMON CAROTID AND INNOMINATE ANGIOGRAPHY COMPARISON:  None Available. MEDICATIONS: Heparin 1000 units IV. No antibiotic was administered within 1 hour of the procedure. ANESTHESIA/SEDATION: Versed 1 mg IV; Fentanyl 50 mcg IV Moderate Sedation Time:  57 minutes The patient was continuously monitored during the procedure by the interventional radiology nurse under my direct supervision. CONTRAST:  Omnipaque 300 approximately 75 mL. FLUOROSCOPY TIME:  Fluoroscopy Time: 14 minutes 18 seconds (861 mGy). COMPLICATIONS: None immediate. TECHNIQUE: Informed written consent was obtained from the patient after a thorough discussion of the procedural risks, benefits and alternatives. All questions were addressed. Maximal Sterile Barrier Technique was utilized including caps, mask, sterile gowns, sterile gloves, sterile drape, hand hygiene and skin antiseptic. A timeout was performed prior to the initiation of the procedure. The left groin was prepped and draped in the usual sterile fashion. Thereafter using modified Seldinger technique, transfemoral access into the left common femoral artery was obtained without difficulty. Over an 0.035 inch guidewire, a 5 French Pinnacle sheath was inserted. Through this, and also over an 0.035 inch guidewire, a 5 [place] JB 1 catheter was advanced to the aortic arch region and selectively positioned in the right common carotid artery, the right  vertebral artery, the left common carotid artery and the left subclavian artery. FINDINGS: The dominant right vertebral artery origin is mildly narrowed. The vessel is seen to opacify to the cranial skull base. Wide patency is seen of the right vertebrobasilar junction and the right posterior-inferior cerebellar artery. There is retrograde opacification of the non dominant left vertebral basilar junction with retrograde flow into the left vertebral artery consistent with left subclavian steal. Severe high-grade stenosis is seen of the left vertebrobasilar junction just proximal to the origin of the left posterior-inferior cerebellar artery. The basilar artery, the posterior cerebral arteries, the superior cerebellar arteries and the anterior-inferior cerebellar arteries demonstrate opacification into the capillary and venous phases. The right common carotid arteriogram demonstrates the right external carotid artery and its branches to be widely patent. The right internal carotid artery at the bulb has a circumferential atherosclerotic plaque with a high-grade stenosis noted on the lateral projection of 80%. More distally, the vessel is seen to opacify to the cranial skull base. The petrous, the cavernous and the supraclinoid right ICA are widely patent. An approximately 2.5 mm wide neck outpouching is evident arising from the posterior wall of the right internal carotid artery just proximal to the origin of the ophthalmic artery. The right middle cerebral artery and the right anterior cerebral artery opacify into the capillary and venous phases. The left common carotid arteriogram demonstrates the left external carotid artery and its major branches to be widely patent. The left internal carotid artery at the bulb to  the cranial skull base is widely patent with approximately 50% stenosis just proximal to the cervical petrous junction. More distally, there is a moderate stenosis at the petrous cavernous junction  associated with a small 4.6 mm x 3.3 mm saccular aneurysm. Distal to this the distal cavernous and the supraclinoid left ICA are widely patent. The left middle cerebral artery proximally has a mild stenosis. More distally, the trifurcation branches are widely patent. The left anterior cerebral artery proximally also has a mild stenosis. More distally, the vessel is seen to opacify normally into the capillary and venous phases. Left subclavian arteriogram demonstrates high-grade stenosis proximally with post stenotic dilatation secondary to a circumferential atherosclerotic disease. No antegrade flow of the left vertebral artery is noted. 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 Probable high-grade 80% stenosis of the proximal right ICA. High-grade stenosis of the non dominant left vertebrobasilar junction just proximal to the origin of the left posterior-inferior cerebellar artery. 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 Mild intracranial arteriosclerosis involving the left MCA M1 segment, and the left anterior cerebral artery proximal A1 segment. PLAN: Findings reviewed with patient and the spouse. Given the significant stenosis with associated left subclavian steal endovascular treatment of the left subclavian artery stenosis proximally was discussed briefly with patient and the spouse. The left subclavian revascularization procedure was reviewed with both the patient and the spouse. This would be under general anesthesia via a left common femoral approach. The patient will be started on dual antiplatelets 7 days prior to procedure. Postprocedure, the patient would spend an overnight stay in the neuro ICU for close neurologic observations, and blood pressure management. Questions were answered to their satisfaction. Both the patient and the spouse are  agreeable to endovascular revascularization of the severe left subclavian artery stenosis. This will be scheduled at the earliest possible. Electronically Signed   By: Julieanne Cotton M.D.   On: 06/27/2023 08:00    Labs:  CBC: Recent Labs    12/04/22 2243 12/06/22 0028 05/30/23 1451 06/21/23 0809  WBC 8.3 12.4* 9.3 9.8  HGB 12.7 12.2 12.4 12.8  HCT 39.8 37.9 38.7 40.4  PLT 332 291 329 338    COAGS: Recent Labs    06/21/23 0809  INR 1.0    BMP: Recent Labs    12/04/22 2243 12/06/22 0028 05/30/23 1451 06/21/23 0809  NA 134* 136 136 138  K 4.2 4.3 3.7 3.7  CL 99 97* 96* 94*  CO2 26 28 27 27   GLUCOSE 124* 160* 103* 139*  BUN 27* 25* 21 21  CALCIUM 8.8* 8.9 8.8* 10.1  CREATININE 1.48* 1.53* 1.38* 1.30*  GFRNONAA 35* 34* 38* 41*    LIVER FUNCTION TESTS: Recent Labs    09/27/22 1528 12/04/22 2243  BILITOT 0.4 0.6  AST 11 15  ALT 6 8  ALKPHOS 96 98  PROT 7.5 7.6  ALBUMIN 4.0 3.8    TUMOR MARKERS: No results for input(s): "AFPTM", "CEA", "CA199", "CHROMGRNA" in the last 8760 hours.  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  This interventional procedure involves the use of X-rays and because of the nature of the planned procedure, it is possible that  we will have prolonged use of X-ray fluoroscopy.  Potential radiation risks to you include (but are not limited to) the following: - A slightly elevated risk for cancer  several years later in life. This risk is typically less than 0.5% percent. This risk is low in comparison to the normal incidence of human cancer, which is 33% for women and 50% for men according to the [employer]. - Radiation induced injury can  include skin redness, resembling a rash, tissue breakdown / ulcers and hair loss (which can be temporary or permanent).   The likelihood of either of these occurring depends on the difficulty of the procedure and whether you are sensitive to radiation due to previous procedures, disease, or genetic conditions.   IF your procedure requires a prolonged use of radiation, you will be notified and given written instructions for further action.  It is your responsibility to monitor the irradiated area for the 2 weeks following the procedure and to notify your physician if you are concerned that you have suffered a radiation induced injury.    All of the patient's questions were answered, patient is agreeable to proceed. Consent signed and in chart.  Pt is aware if intervention is performed she will be admitted to Neuro ICU overnight--- planned for Discharge in am if stable   Thank you for this interesting consult.  I greatly enjoyed meeting LEYDA VANDERWERF and look forward to participating in their care.  A copy of this report was sent to the requesting provider on this date.  Electronically Signed: Robet Leu, PA-C 07/06/2023, 7:07 AM   I spent a total of    25 Minutes in face to face in clinical consultation, greater than 50% of which was counseling/coordinating care for L subclavian artery angioplasty/stent

## 2023-07-06 NOTE — Sedation Documentation (Signed)
ACT 153. DR Corliss Skains aware. 1000u heparin ordered and to be given by CRNA.

## 2023-07-06 NOTE — Progress Notes (Signed)
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 Corliss Skains.   She was evaluated post-procedure in the Neuro ICU. She remains on bedrest but is awake, alert and oriented. She has some mild back pain but otherwise feels well. Coordination and sensation intact, moves all extremities. Hands and feet feel somewhat cool but patient states this normal for her. Left foot with slight sensation of numbness per report from bedside RN. Left groin site covered in pressure dressing. Site is non-tender.   IV heparin infusing.   Tentative plans for discharge home tomorrow. NIR will see patient in the morning.   Alwyn Ren, [place] [phone] 07/06/2023, 3:43 PM

## 2023-07-06 NOTE — Transfer of Care (Signed)
Immediate Anesthesia Transfer of Care Note  Patient: Mary [name]  Procedure(s) Performed: Endovascular revascularization of the severe left subclavian artery stenosis  Patient Location: PACU  Anesthesia Type:General  Level of Consciousness: awake, alert , and oriented  Airway & Oxygen Therapy: Patient connected to face mask oxygen  Post-op Assessment: Report given to RN, Post -op Vital signs reviewed and stable, and Patient moving all extremities X 4  Post vital signs: Reviewed and stable  Last Vitals:  Vitals Value Taken Time  BP 164/67 07/06/23 1145  Temp    Pulse 69 07/06/23 1146  Resp 18 07/06/23 1146  SpO2 100 % 07/06/23 1146  Vitals shown include unfiled device data.  Last Pain:  Vitals:   07/06/23 0737  TempSrc:   PainSc: 0-No pain         Complications: No notable events documented.

## 2023-07-06 NOTE — Anesthesia Procedure Notes (Signed)
Arterial Line Insertion Start/End11/01/2023 8:05 AM, 07/06/2023 8:10 AM Performed by: Eilene Ghazi, MD, anesthesiologist  Patient location: Pre-op. Preanesthetic checklist: patient identified, IV checked, site marked, risks and benefits discussed, surgical consent, monitors and equipment checked, pre-op evaluation, timeout performed and anesthesia consent Lidocaine 1% used for infiltration Right, radial was placed Catheter size: 20 G Hand hygiene performed  and maximum sterile barriers used   Attempts: 1 Procedure performed using ultrasound guided technique. Ultrasound Notes:needle tip was noted to be adjacent to the nerve/plexus identified, no ultrasound evidence of intravascular and/or intraneural injection and image(s) printed for medical record Following insertion, dressing applied. Post procedure assessment: normal and unchanged  Patient tolerated the procedure well with no immediate complications.

## 2023-07-06 NOTE — Anesthesia Postprocedure Evaluation (Signed)
Anesthesia Post Note  Patient: Mary [name]  Procedure(s) Performed: Endovascular revascularization of the severe left subclavian artery stenosis     Patient location during evaluation: PACU Anesthesia Type: General Level of consciousness: awake and alert Pain management: pain level controlled Vital Signs Assessment: post-procedure vital signs reviewed and stable Respiratory status: spontaneous breathing, nonlabored ventilation, respiratory function stable and patient connected to nasal cannula oxygen Cardiovascular status: blood pressure returned to baseline and stable Postop Assessment: no apparent nausea or vomiting Anesthetic complications: no  No notable events documented.  Last Vitals:  Vitals:   07/06/23 1145 07/06/23 1200  BP: (!) 164/67 (!) 144/59  Pulse: 70 64  Resp: 15 19  Temp:    SpO2: 100% 98%    Last Pain:  Vitals:   07/06/23 1142  TempSrc:   PainSc: 0-No pain                 Tyke Outman S

## 2023-07-06 NOTE — Procedures (Signed)
INR   Status post left subclavian arteriogram.  Left common femoral artery approach.  Findings.  1. 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  Status post revascularization of proximal left subclavian artery with stent assisted angioplasty.  Manual compression held at the left groin puncture site for hemostasis with quick clot for approximately 20 minutes.  Distal pulses all dopplerable unchanged from prior to the procedure. ( Lt  DP not dopplerable).  Lt radial and ulnar arteries dopplerable.  Patient extubated.  Denies any H/As,N/V   Pupils 2 to 3 mm R = Lt sluggisly reactive. No facial asymmetry.  Tongue midline. Moves all 4s  equally  to command .   S.Sheyenne Konz MD

## 2023-07-07 ENCOUNTER — Encounter (HOSPITAL_COMMUNITY): Payer: Self-pay | Admitting: Interventional Radiology

## 2023-07-07 LAB — CBC WITH DIFFERENTIAL/PLATELET
Abs Immature Granulocytes: 0.04 10*3/uL (ref 0.00–0.07)
Basophils Absolute: 0.1 10*3/uL (ref 0.0–0.1)
Basophils Relative: 1 %
Eosinophils Absolute: 0.1 10*3/uL (ref 0.0–0.5)
Eosinophils Relative: 1 %
HCT: 32 % — ABNORMAL LOW (ref 36.0–46.0)
Hemoglobin: 10 g/dL — ABNORMAL LOW (ref 12.0–15.0)
Immature Granulocytes: 1 %
Lymphocytes Relative: 19 %
Lymphs Abs: 1.6 10*3/uL (ref 0.7–4.0)
MCH: 28.2 pg (ref 26.0–34.0)
MCHC: 31.3 g/dL (ref 30.0–36.0)
MCV: 90.4 fL (ref 80.0–100.0)
Monocytes Absolute: 0.6 10*3/uL (ref 0.1–1.0)
Monocytes Relative: 7 %
Neutro Abs: 6 10*3/uL (ref 1.7–7.7)
Neutrophils Relative %: 71 %
Platelets: 287 10*3/uL (ref 150–400)
RBC: 3.54 MIL/uL — ABNORMAL LOW (ref 3.87–5.11)
RDW: 14.4 % (ref 11.5–15.5)
WBC: 8.5 10*3/uL (ref 4.0–10.5)
nRBC: 0 % (ref 0.0–0.2)

## 2023-07-07 LAB — BASIC METABOLIC PANEL
Anion gap: 7 (ref 5–15)
BUN: 17 mg/dL (ref 8–23)
CO2: 23 mmol/L (ref 22–32)
Calcium: 8.3 mg/dL — ABNORMAL LOW (ref 8.9–10.3)
Chloride: 106 mmol/L (ref 98–111)
Creatinine, Ser: 1.2 mg/dL — ABNORMAL HIGH (ref 0.44–1.00)
GFR, Estimated: 45 mL/min — ABNORMAL LOW (ref >60)
Glucose, Bld: 93 mg/dL (ref 70–99)
Potassium: 3.6 mmol/L (ref 3.5–5.1)
Sodium: 136 mmol/L (ref 135–145)

## 2023-07-07 NOTE — Discharge Summary (Signed)
Patient ID: Mary [name] MRN: [id number] DOB/AGE: Apr 22, 1941 82 y.o.  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  She presented to [facility] Radiology 07/06/23 with intent to treat her stenosis and underwent successful left subclavian artery stent assisted angioplasty. She was admitted overnight for observation. She has recovered well with the exception of poor sleep due to back pain.  She has been in the recliner since the early morning hours with improvement in her back pain.  She denies dizziness, lightheadedness.  She has been able to ambulate to the restroom.  She has eaten breakfast with tolerance.  She does use a cane at all times at home, walker when out of the house and feels she is at her baseline with mobility.    She has been given care instructions with Dr. Corliss Skains at bedside.  She is to resume Eliquis and Brilinta 90mg  BID. She is understanding of this plan and is agreeable.  She understands schedulers will contact her with date and time of follow-up appointment expected in approximately 2 weeks.   She is stable for discharge home to the care of her husband.   Discharge Exam: Blood pressure 106/68, pulse 74, temperature 98.5 F (36.9 C), temperature source Oral, resp. rate 17, height 5\' 7"  (1.702 m), weight 198 lb (89.8 kg), SpO2 95%. General appearance: alert, cooperative, and no distress Resp: clear to auscultation bilaterally Cardio: regular rate and rhythm, S1,  S2 normal, no murmur, click, rub or gallop GI: soft, non-tender; bowel sounds normal; no masses,  no organomegaly Skin: Skin color, texture, turgor normal. No rashes or lesions Incision/Wound: Procedure site intact, clean, and dry.   Radial pulse palpable.  Disposition: Discharge disposition: 01-Home or Self Care       Discharge Instructions     Diet - low sodium heart healthy   Complete by: As directed    Discharge instructions   Complete by: As directed    Patient to discharge home today.  Increase activity slowly.  Continue to use cane and walker as needed.  May remove dressing in 24 hrs.   No wound care   Complete by: As directed       Allergies as of 07/07/2023       Reactions   Other Other (See Comments)   NO Blind Scopes with Naso Gastric tube.  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   Latex    Tape= burns   Nsaids Other (See Comments)   S/P gastric bypass-told not to take. GI bleeds with NSAIDS        Medication List     STOP taking these medications    aspirin EC 81 MG tablet       TAKE  these medications    albuterol 108 (90 Base) MCG/ACT inhaler Commonly known as: VENTOLIN HFA Inhale 1-2 puffs into the lungs every 6 (six) hours as needed for wheezing or shortness of breath.   apixaban 5 MG Tabs tablet Commonly known as: Eliquis Take 1 tablet (5 mg total) by mouth 2 (two) times daily.   atorvastatin 80 MG tablet Commonly known as: LIPITOR TAKE 1 TABLET(80 MG) BY MOUTH DAILY What changed: See the new instructions.   baclofen 10 MG tablet Commonly known as: LIORESAL Take 1 tablet (10 mg total) by mouth daily as needed for muscle spasms.   empagliflozin 10 MG Tabs  tablet Commonly known as: JARDIANCE Take 1 tablet (10 mg total) by mouth daily before breakfast.   HYDROcodone-acetaminophen 5-325 MG tablet Commonly known as: NORCO/VICODIN Take 1 tablet by mouth every 6 (six) hours as needed. What changed: reasons to take this   insulin glargine 100 UNIT/ML injection Commonly known as: LANTUS Inject 20 Units into the skin in the morning. Sliding Scale   insulin lispro 100 UNIT/ML injection Commonly known as: HUMALOG Inject 4 Units into the skin 4 (four) times daily as needed for high blood sugar. If blood sugar over 200.   levothyroxine 137 MCG tablet Commonly known as: SYNTHROID Take 1 tablet (137 mcg total) by mouth daily.   magnesium gluconate 500 MG tablet Commonly known as: MAGONATE Take 500 mg by mouth daily.   metoprolol tartrate 25 MG tablet Commonly known as: LOPRESSOR Take 1 tablet (25 mg total) by mouth 2 (two) times daily.   nitroGLYCERIN 0.4 MG SL tablet Commonly known as: NITROSTAT Place 1 tablet (0.4 mg total) under the tongue every 5 (five) minutes as needed for chest pain.   Potassium Chloride ER 20 MEQ Tbcr Take 1 tablet (20 mEq total) by mouth 2 (two) times daily. What changed: how much to take   spironolactone 25 MG tablet Commonly known as: ALDACTONE TAKE 1/2 TABLET BY MOUTH DAILY **MUST KEEP APRIL APPOINTMENT FOR FURTHER REFILLS. FINAL ATTEMPT**   ticagrelor 90 MG Tabs tablet Commonly known as: Brilinta Take 1 tablet (90 mg total) by mouth 2 (two) times daily. Begin taking medication 5 days prior to procedure.   torsemide 20 MG tablet Commonly known as: DEMADEX If you weigh 200 pounds or less, take 20 mg twice a day. If you weigh over 200 pounds, take 40 mg in the morning and 20 mg in the afternoon   Vitamin B-12 5000 MCG Subl Place 5,000 mcg under the tongue daily. What changed: Another medication with the same name was removed. Continue taking this medication, and follow the directions you see here.    Vitamin D (Ergocalciferol) 1.25 MG (50000 UNIT) Caps capsule Commonly known as: DRISDOL TAKE 1 CAPSULE BY MOUTH EVERY 7 DAYS What changed: when to take this        Follow-up Information     Julieanne Cotton, MD Follow up.   Specialties: Interventional Radiology, Radiology Why: Schedulers will contact you with date and time of follow-up appointment. Contact information: [address] [place] [address] [place] [address] [phone]                  Electronically Signed: Hoyt Koch, PA 07/07/2023, 10:24 AM   I have spent Greater Than 30 Minutes discharging Cathren Harsh.

## 2023-07-08 ENCOUNTER — Telehealth: Payer: Self-pay

## 2023-07-08 ENCOUNTER — Other Ambulatory Visit: Payer: Self-pay | Admitting: Internal Medicine

## 2023-07-08 NOTE — Transitions of Care (Post Inpatient/ED Visit) (Addendum)
   07/08/2023  Name: Mary [name] MRN: [id number] DOB: 04/17/1941  Today's TOC FU Call Status: Today's TOC FU Call Status:: Successful TOC FU Call Completed TOC FU Call Complete Date: 07/08/23 Patient's Name and Date of Birth confirmed.  Transition Care Management Follow-up Telephone Call Date of Discharge: 07/07/23 Discharge Facility: [facility]) Type of Discharge: Inpatient Admission Primary Inpatient Discharge Diagnosis:: endovascular aneurysm repair How have you been since you were released from the hospital?: Better Any questions or concerns?: No  Items Reviewed: Did you receive and understand the discharge instructions provided?: Yes Medications obtained,verified, and reconciled?: Yes (Medications Reviewed) Any new allergies since your discharge?: No Dietary orders reviewed?: Yes Type of Diet Ordered:: low sodium heart healthy Do you have support at home?: Yes People in Home: spouse Name of Support/Comfort Primary Source: husband  Medications Reviewed Today: Medications Reviewed Today   Medications were not reviewed in this encounter     Home Care and Equipment/Supplies: Were Home Health Services Ordered?: NA Any new equipment or medical supplies ordered?: NA  Functional Questionnaire: Do you need assistance with bathing/showering or dressing?: No Do you need assistance with meal preparation?: No Do you need assistance with eating?: No Do you have difficulty maintaining continence: No Do you need assistance with getting out of bed/getting out of a chair/moving?: No Do you have difficulty managing or taking your medications?: No  Follow up appointments reviewed: PCP Follow-up appointment confirmed?: Yes  In the middle of my call, patient reports that she is tired and does not want to talk.  Reports to me she is fine and she does not need anything.  Reports she has all her medications but did not allow me to review.  Lonia Chimera, RN, BSN, CEN [facility]- Transition of Care Team.  [facility] [phone]

## 2023-07-11 ENCOUNTER — Other Ambulatory Visit (HOSPITAL_COMMUNITY): Payer: Self-pay | Admitting: Interventional Radiology

## 2023-07-11 DIAGNOSIS — I771 Stricture of artery: Secondary | ICD-10-CM

## 2023-07-13 ENCOUNTER — Other Ambulatory Visit: Payer: Self-pay

## 2023-07-13 DIAGNOSIS — I25118 Atherosclerotic heart disease of native coronary artery with other forms of angina pectoris: Secondary | ICD-10-CM

## 2023-07-13 DIAGNOSIS — I5032 Chronic diastolic (congestive) heart failure: Secondary | ICD-10-CM

## 2023-07-13 DIAGNOSIS — I441 Atrioventricular block, second degree: Secondary | ICD-10-CM

## 2023-07-13 NOTE — Telephone Encounter (Signed)
Pt's pharmacy is requesting clarification on medication potassium 20 meq. This medication was refilled pt taking 1 tablet daily BID, but 360 tablets was dispensed. Pt states that she takes potassium 20 meq 2 tablets BID. Please clarify how [name] would like pt to take potassium 20 meq. Please address

## 2023-07-14 MED ORDER — POTASSIUM CHLORIDE ER 20 MEQ PO TBCR: 20.0000 meq | EXTENDED_RELEASE_TABLET | Freq: Two times a day (BID) | ORAL | 3 refills | Status: DC

## 2023-07-14 NOTE — Telephone Encounter (Signed)
Please refill the potassium prescription the way she says she has been taking it, which is 40 mEq (2 x 20 mEq tablets) twice daily.

## 2023-07-20 ENCOUNTER — Ambulatory Visit (HOSPITAL_COMMUNITY)
Admission: RE | Admit: 2023-07-20 | Discharge: 2023-07-20 | Disposition: A | Discharge status: Home / Self Care | Payer: Medicare Other | Source: Ambulatory Visit | Attending: Interventional Radiology | Admitting: Interventional Radiology

## 2023-07-20 DIAGNOSIS — I771 Stricture of artery: Secondary | ICD-10-CM

## 2023-07-21 HISTORY — PX: IR RADIOLOGIST EVAL & MGMT: IMG5224

## 2023-07-23 ENCOUNTER — Emergency Department (HOSPITAL_COMMUNITY): Payer: Medicare Other

## 2023-07-23 ENCOUNTER — Other Ambulatory Visit: Payer: Self-pay

## 2023-07-23 ENCOUNTER — Emergency Department (HOSPITAL_COMMUNITY)
Admission: EM | Admit: 2023-07-23 | Discharge: 2023-07-23 | Disposition: A | Discharge status: Home / Self Care | Payer: Medicare Other | Attending: Emergency Medicine | Admitting: Emergency Medicine

## 2023-07-23 DIAGNOSIS — J449 Chronic obstructive pulmonary disease, unspecified: Secondary | ICD-10-CM | POA: Insufficient documentation

## 2023-07-23 DIAGNOSIS — D72829 Elevated white blood cell count, unspecified: Secondary | ICD-10-CM | POA: Insufficient documentation

## 2023-07-23 DIAGNOSIS — Z79899 Other long term (current) drug therapy: Secondary | ICD-10-CM | POA: Diagnosis not present

## 2023-07-23 DIAGNOSIS — T8149XA Infection following a procedure, other surgical site, initial encounter: Secondary | ICD-10-CM

## 2023-07-23 DIAGNOSIS — T8141XA Infection following a procedure, superficial incisional surgical site, initial encounter: Secondary | ICD-10-CM | POA: Diagnosis present

## 2023-07-23 DIAGNOSIS — D649 Anemia, unspecified: Secondary | ICD-10-CM | POA: Diagnosis not present

## 2023-07-23 DIAGNOSIS — Z794 Long term (current) use of insulin: Secondary | ICD-10-CM | POA: Insufficient documentation

## 2023-07-23 DIAGNOSIS — Z91040 Latex allergy status: Secondary | ICD-10-CM | POA: Diagnosis not present

## 2023-07-23 DIAGNOSIS — N189 Chronic kidney disease, unspecified: Secondary | ICD-10-CM | POA: Diagnosis not present

## 2023-07-23 DIAGNOSIS — L03314 Cellulitis of groin: Secondary | ICD-10-CM | POA: Diagnosis not present

## 2023-07-23 DIAGNOSIS — Z7901 Long term (current) use of anticoagulants: Secondary | ICD-10-CM | POA: Insufficient documentation

## 2023-07-23 DIAGNOSIS — Z950 Presence of cardiac pacemaker: Secondary | ICD-10-CM | POA: Diagnosis not present

## 2023-07-23 LAB — CBC
HCT: 37.1 % (ref 36.0–46.0)
Hemoglobin: 11.8 g/dL — ABNORMAL LOW (ref 12.0–15.0)
MCH: 28.9 pg (ref 26.0–34.0)
MCHC: 31.8 g/dL (ref 30.0–36.0)
MCV: 90.9 fL (ref 80.0–100.0)
Platelets: 399 10*3/uL (ref 150–400)
RBC: 4.08 MIL/uL (ref 3.87–5.11)
RDW: 14.7 % (ref 11.5–15.5)
WBC: 10.9 10*3/uL — ABNORMAL HIGH (ref 4.0–10.5)
nRBC: 0 % (ref 0.0–0.2)

## 2023-07-23 LAB — BASIC METABOLIC PANEL
Anion gap: 12 (ref 5–15)
BUN: 21 mg/dL (ref 8–23)
CO2: 27 mmol/L (ref 22–32)
Calcium: 9.1 mg/dL (ref 8.9–10.3)
Chloride: 96 mmol/L — ABNORMAL LOW (ref 98–111)
Creatinine, Ser: 1.38 mg/dL — ABNORMAL HIGH (ref 0.44–1.00)
GFR, Estimated: 38 mL/min — ABNORMAL LOW (ref >60)
Glucose, Bld: 152 mg/dL — ABNORMAL HIGH (ref 70–99)
Potassium: 3.6 mmol/L (ref 3.5–5.1)
Sodium: 135 mmol/L (ref 135–145)

## 2023-07-23 LAB — PROTIME-INR
INR: 1.2 (ref 0.8–1.2)
Prothrombin Time: 15.3 s — ABNORMAL HIGH (ref 11.4–15.2)

## 2023-07-23 MED ORDER — CEPHALEXIN 250 MG PO CAPS
1000.0000 mg | ORAL_CAPSULE | Freq: Once | ORAL | Status: AC
  Administered 2023-07-23: 1000 mg via ORAL
  Filled 2023-07-23: qty 4

## 2023-07-23 MED ORDER — IOHEXOL 350 MG/ML SOLN
75.0000 mL | Freq: Once | INTRAVENOUS | Status: AC | PRN
  Administered 2023-07-23: 60 mL via INTRAVENOUS

## 2023-07-23 MED ORDER — CEPHALEXIN 500 MG PO CAPS: 1000.0000 mg | ORAL_CAPSULE | Freq: Two times a day (BID) | ORAL | 0 refills | Status: DC

## 2023-07-23 NOTE — ED Provider Notes (Signed)
[facility] AT [facility] Provider Note   CSN: [id number] Arrival date & time: 07/23/23  1000     History  Chief Complaint  Patient presents with   Groin Pain   groin bleeding   HPI Mary [name] is a 82 y.o. female with history of severe proximal left subclavian artery stenosis status post revascularization of proximal left subclavian artery stent assisted angioplasty performed on 11/6 presenting for left groin bleeding. States she woke up this morning and noticed some bleeding about her left groin. States at this time is not painful. Denies fever and chills. Denies urinary symptoms. States she has not had any issues with bleeding since her procedure. Denies shortness of breath and lightheadedness.  Patient is on Eliquis for A-fib.  Other pertinent medical history includes A-fib, CKD, COPD, cardiac pacemaker    Groin Pain       Home Medications Prior to Admission medications   Medication Sig Start Date End Date Taking? Authorizing Provider  albuterol (VENTOLIN HFA) 108 (90 Base) MCG/ACT inhaler Inhale 1-2 puffs into the lungs every 6 (six) hours as needed for wheezing or shortness of breath. 10/22/21   Eustace Moore, MD  apixaban (ELIQUIS) 5 MG TABS tablet Take 1 tablet (5 mg total) by mouth 2 (two) times daily. 04/18/23   Marinus Maw, MD  atorvastatin (LIPITOR) 80 MG tablet TAKE 1 TABLET(80 MG) BY MOUTH DAILY Patient taking differently: Take 80 mg by mouth daily. 09/17/19   Myrlene Broker, MD  baclofen (LIORESAL) 10 MG tablet Take 1 tablet (10 mg total) by mouth daily as needed for muscle spasms. 02/25/22   Myrlene Broker, MD  Cyanocobalamin (VITAMIN B-12) 5000 MCG SUBL Place 5,000 mcg under the tongue daily.    [provider]  empagliflozin (JARDIANCE) 10 MG TABS tablet Take 1 tablet (10 mg total) by mouth daily before breakfast. 04/05/22   Lyn Records, MD  HYDROcodone-acetaminophen (NORCO/VICODIN) 5-325 MG tablet  Take 1 tablet by mouth every 6 (six) hours as needed. Patient taking differently: Take 1 tablet by mouth every 6 (six) hours as needed for moderate pain (pain score 4-6). 09/27/22   Myrlene Broker, MD  insulin glargine (LANTUS) 100 UNIT/ML injection Inject 20 Units into the skin in the morning. Sliding Scale    [provider]  insulin lispro (HUMALOG) 100 UNIT/ML injection Inject 4 Units into the skin 4 (four) times daily as needed for high blood sugar. If blood sugar over 200.    [provider]  levothyroxine (SYNTHROID) 137 MCG tablet Take 1 tablet (137 mcg total) by mouth daily. 02/18/22   Myrlene Broker, MD  magnesium gluconate (MAGONATE) 500 MG tablet Take 500 mg by mouth daily.    [provider]  metoprolol tartrate (LOPRESSOR) 25 MG tablet Take 1 tablet (25 mg total) by mouth 2 (two) times daily. 04/22/23   Marinus Maw, MD  nitroGLYCERIN (NITROSTAT) 0.4 MG SL tablet Place 1 tablet (0.4 mg total) under the tongue every 5 (five) minutes as needed for chest pain. 04/09/19   Lyn Records, MD  Potassium Chloride ER 20 MEQ TBCR Take 1 tablet (20 mEq total) by mouth 2 (two) times daily. 07/14/23   Croitoru, Mihai, MD  spironolactone (ALDACTONE) 25 MG tablet TAKE 1/2 TABLET BY MOUTH DAILY **MUST KEEP APRIL APPOINTMENT FOR FURTHER REFILLS. FINAL ATTEMPT** 04/12/23   Croitoru, Mihai, MD  ticagrelor (BRILINTA) 90 MG TABS tablet Take 1 tablet (90 mg total)  by mouth 2 (two) times daily. Begin taking medication 5 days prior to procedure. 06/27/23 09/25/23  Mickie Kay, NP  torsemide (DEMADEX) 20 MG tablet If you weigh 200 pounds or less, take 20 mg twice a day. If you weigh over 200 pounds, take 40 mg in the morning and 20 mg in the afternoon 05/11/23   Croitoru, Mihai, MD  Vitamin D, Ergocalciferol, (DRISDOL) 1.25 MG (50000 UNIT) CAPS capsule TAKE 1 CAPSULE BY MOUTH EVERY 7 DAYS Patient taking differently: Take 50,000 Units by mouth every Wednesday. 04/12/22    Myrlene Broker, MD      Allergies    Other, Adhesive [tape], Aspirin, Latex, and Nsaids    Review of Systems   See HPI  Physical Exam Updated Vital Signs BP 130/61   Pulse 74   Temp 98.4 F (36.9 C) (Oral)   Resp 18   SpO2 95%  Physical Exam Vitals and nursing note reviewed.  HENT:     Head: Normocephalic and atraumatic.     Mouth/Throat:     Mouth: Mucous membranes are moist.  Eyes:     General:        Right eye: No discharge.        Left eye: No discharge.     Conjunctiva/sclera: Conjunctivae normal.  Cardiovascular:     Rate and Rhythm: Normal rate and regular rhythm.     Pulses: Normal pulses.     Heart sounds: Normal heart sounds.  Pulmonary:     Effort: Pulmonary effort is normal.     Breath sounds: Normal breath sounds.  Abdominal:     General: Abdomen is flat.     Palpations: Abdomen is soft.    Skin:    General: Skin is warm and dry.  Neurological:     General: No focal deficit present.  Psychiatric:        Mood and Affect: Mood normal.     ED Results / Procedures / Treatments   Labs (all labs ordered are listed, but only abnormal results are displayed) Labs Reviewed  CBC - Abnormal; Notable for the following components:      Result Value   WBC 10.9 (*)    Hemoglobin 11.8 (*)    All other components within normal limits  BASIC METABOLIC PANEL - Abnormal; Notable for the following components:   Chloride 96 (*)    Glucose, Bld 152 (*)    Creatinine, Ser 1.38 (*)    GFR, Estimated 38 (*)    All other components within normal limits  PROTIME-INR - Abnormal; Notable for the following components:   Prothrombin Time 15.3 (*)    All other components within normal limits    EKG None  Radiology CT PELVIS W CONTRAST  Result Date: 07/23/2023 CLINICAL DATA:  Soft tissue infection suspected, pelvis, no prior imaging post op complication interventional surgery to remove blockages on Nov. 6. They went through my groin on the left side. This  morning I had bleeding from the site this morning. EXAM: CT PELVIS WITH CONTRAST TECHNIQUE: Multidetector CT imaging of the pelvis was performed using the standard protocol following the bolus administration of intravenous contrast. RADIATION DOSE REDUCTION: This exam was performed according to the departmental dose-optimization program which includes automated exposure control, adjustment of the mA and/or kV according to patient size and/or use of iterative reconstruction technique. CONTRAST:  60mL OMNIPAQUE IOHEXOL 350 MG/ML SOLN COMPARISON:  CT abdomen pelvis 06/06/2021 FINDINGS: Urinary Tract: Urinary bladder is distended with urine.  No abnormality visualized. Bowel:  Unremarkable visualized pelvic bowel loops. Vascular/Lymphatic: Severe atherosclerotic plaque. No pathologically enlarged lymph nodes. No significant vascular abnormality seen. Reproductive: Coarsely calcified lesions along the uterus a 4 cm consistent with degenerative uterine fibroid likely subserosal in the intramural. Otherwise the uterus and bilateral adnexal regions are unremarkable. Other: No intraperitoneal free fluid. No intraperitoneal free gas. No organized fluid collection. Musculoskeletal: Lower midline anterior abdominal wall infraumbilical fat containing ventral wall hernia. Associated abdominal defect of 1.1 cm. Inferior to this there is a second infraumbilical ventral hernia with wall defect of 5.4 cm containing a short loop of small bowel. Soft tissue density in the region of the left inguinal canal suggestive of prior inguinal hernia repair. No organized fluid collection. Mild subcutaneus soft tissue edema along the ischial tuberosity pressure points with no definite sacral ulceration or soft tissue defect. No subcutaneus soft tissue emphysema. No suspicious lytic or blastic osseous lesions. No acute displaced fracture. Multilevel degenerative changes of the visualized lower lumbar spine. Mild retrolisthesis of L5 on S1.  Intervertebral disc space vacuum phenomenon. IMPRESSION: 1. Mild subcutaneus soft tissue edema along the ischial tuberosity pressure points with no definite sacral ulceration or soft tissue defect. No subcutaneus soft tissue emphysema. No abscess formation. 2. Two infraumbilical lower abdominal wall ventral hernias containing fat and the other containing a short loop of small bowel. No associated bowel obstruction or findings to suggest ischemia. Electronically Signed   By: Tish Frederickson M.D.   On: 07/23/2023 14:41    Procedures Procedures    Medications Ordered in ED Medications  cephALEXin (KEFLEX) capsule 1,000 mg (has no administration in time range)  iohexol (OMNIPAQUE) 350 MG/ML injection 75 mL (60 mLs Intravenous Contrast Given 07/23/23 1405)    ED Course/ Medical Decision Making/ A&P                                 Medical Decision Making Amount and/or Complexity of Data Reviewed Labs: ordered. Radiology: ordered.  Risk Prescription drug management.   Initial Impression and Ddx 82 year old well-appearing female presenting for postop concern.  Exam notable for tiny oozing site in the left inguinal area with small underlying area of fluctuance with mild erythema.  DDx includes abscess, cellulitis, deep space infection, bacteremia, sepsis, other. Patient PMH that increases complexity of ED encounter:   history of severe proximal left subclavian artery stenosis status post revascularization of proximal left subclavian artery stent assisted angioplasty and A-fib, CKD, COPD, cardiac pacemaker  Interpretation of Diagnostics - I independent reviewed and interpreted the labs as followed: Elevated PT and mild leukocytosis and mild anemia, reduced GFR  - I independently visualized the following imaging with scope of interpretation limited to determining acute life threatening conditions related to emergency care: CT pelvis, which revealed 1. Mild subcutaneus soft tissue edema along the  ischial tuberosity pressure points with no definite sacral ulceration or soft tissue defect. No subcutaneus soft tissue emphysema. No abscess formation. 2. Two infraumbilical lower abdominal wall ventral hernias containing fat and the other containing a short loop of small bowel. No associated bowel obstruction or findings to suggest ischemia.  Patient Reassessment and Ultimate Disposition/Management On reassessment, patient remained well.  Inspection of the catheter insertion site in her left inguinal area was concern for possible mild cellulitis.  Workup does not suggest sepsis, other deep space infection or bacteremia at this time.  Started her on Keflex.  Applied quick clot  gauze to the area.  Advised her to follow-up with her PCP.  Discussed return precautions.  Vital stable discharged home in good condition.  Patient management required discussion with the following services or consulting groups:  None  Complexity of Problems Addressed Acute complicated illness or Injury  Additional Data Reviewed and Analyzed Further history obtained from: Further history from spouse/family member, Past medical history and medications listed in the EMR, and Prior ED visit notes  Patient Encounter Risk Assessment None         Final Clinical Impression(s) / ED Diagnoses Final diagnoses:  Cellulitis, wound, post-operative    Rx / DC Orders ED Discharge Orders     None         Gareth Eagle, PA-C 07/23/23 1735    Derwood Kaplan, MD 07/23/23 1914

## 2023-07-23 NOTE — ED Triage Notes (Signed)
Pt. Stated, I had a interventional surgery to remove blockages on Nov. 6.. They went through my groin on the left side. This morning I had bleeding from the site this morning.

## 2023-07-23 NOTE — Discharge Instructions (Addendum)
Evaluation today revealed that you may have some inflammation around the insertion site of the catheter.  I am starting on Keflex which is an antibiotic.  Please take the entire course and follow-up with your PCP.  If you start develop pain in that area start to ooze pustulant discharge, develop a fever, nausea vomiting diarrhea or any other concerning symptom please return to the emergency department further evaluation.

## 2023-07-26 ENCOUNTER — Telehealth: Payer: Self-pay | Admitting: Emergency Medicine

## 2023-07-26 ENCOUNTER — Ambulatory Visit: Payer: Medicare Other | Admitting: Cardiovascular Disease

## 2023-07-26 NOTE — Telephone Encounter (Signed)
Left message: Apologized for her not being able to be seen today (she was not checked in properly, so we did not even know she was here, then she had to leave to go to another appointment)  Wanted to let her know that we have availability on 08/08/23 at 4:30 pm and on 08/18/23, there are some appointments available. Asked her to call or MyChart message for an appt.

## 2023-07-27 NOTE — Telephone Encounter (Signed)
Pt returned call. She accepted apology and was grateful for the apology. Appt made for 08/08/23 at 1630 with Dr Royann Shivers

## 2023-08-08 ENCOUNTER — Ambulatory Visit: Payer: Medicare Other | Admitting: Cardiovascular Disease

## 2023-08-16 ENCOUNTER — Ambulatory Visit (INDEPENDENT_AMBULATORY_CARE_PROVIDER_SITE_OTHER): Payer: Medicare Other

## 2023-08-16 DIAGNOSIS — I442 Atrioventricular block, complete: Secondary | ICD-10-CM | POA: Diagnosis not present

## 2023-08-17 ENCOUNTER — Ambulatory Visit: Payer: Medicare Other | Admitting: Physician Assistant

## 2023-08-17 LAB — CUP PACEART REMOTE DEVICE CHECK
Battery Remaining Longevity: 71 mo
Battery Remaining Percentage: 88 %
Battery Voltage: 2.99 V
Brady Statistic AP VP Percent: 92 %
Brady Statistic AP VS Percent: 1 %
Brady Statistic AS VP Percent: 7.5 %
Brady Statistic AS VS Percent: 1 %
Brady Statistic RA Percent Paced: 84 %
Brady Statistic RV Percent Paced: 99 %
Date Time Interrogation Session: 20241217020015
Implantable Lead Connection Status: 753985
Implantable Lead Connection Status: 753985
Implantable Lead Implant Date: 20150506
Implantable Lead Implant Date: 20150506
Implantable Lead Location: 753859
Implantable Lead Location: 753860
Implantable Pulse Generator Implant Date: 20231218
Lead Channel Impedance Value: 360 Ohm
Lead Channel Impedance Value: 440 Ohm
Lead Channel Pacing Threshold Amplitude: 1 V
Lead Channel Pacing Threshold Amplitude: 1 V
Lead Channel Pacing Threshold Pulse Width: 0.5 ms
Lead Channel Pacing Threshold Pulse Width: 0.5 ms
Lead Channel Sensing Intrinsic Amplitude: 0.2 mV
Lead Channel Sensing Intrinsic Amplitude: 4.3 mV
Lead Channel Setting Pacing Amplitude: 2.5 V
Lead Channel Setting Pacing Amplitude: 2.5 V
Lead Channel Setting Pacing Pulse Width: 0.5 ms
Lead Channel Setting Sensing Sensitivity: 4 mV
Pulse Gen Model: 2272
Pulse Gen Serial Number: 8128617

## 2023-09-23 NOTE — Progress Notes (Signed)
Remote pacemaker transmission.

## 2023-09-23 NOTE — Addendum Note (Signed)
Addended by: Geralyn Flash D on: 09/23/2023 09:44 AM   Modules accepted: Orders

## 2023-11-02 ENCOUNTER — Ambulatory Visit: Payer: Medicare Other | Admitting: Neurology

## 2023-11-09 ENCOUNTER — Ambulatory Visit: Payer: Medicare Other | Admitting: Cardiovascular Disease

## 2023-11-15 ENCOUNTER — Ambulatory Visit (INDEPENDENT_AMBULATORY_CARE_PROVIDER_SITE_OTHER): Payer: Medicare Other

## 2023-11-15 DIAGNOSIS — I442 Atrioventricular block, complete: Secondary | ICD-10-CM | POA: Diagnosis not present

## 2023-11-16 LAB — CUP PACEART REMOTE DEVICE CHECK
Battery Remaining Longevity: 67 mo
Battery Remaining Percentage: 84 %
Battery Voltage: 2.99 V
Brady Statistic AP VP Percent: 97 %
Brady Statistic AP VS Percent: 1 %
Brady Statistic AS VP Percent: 3.1 %
Brady Statistic AS VS Percent: 1 %
Brady Statistic RA Percent Paced: 91 %
Brady Statistic RV Percent Paced: 99 %
Date Time Interrogation Session: 20250318020015
Implantable Lead Connection Status: 753985
Implantable Lead Connection Status: 753985
Implantable Lead Implant Date: 20150506
Implantable Lead Implant Date: 20150506
Implantable Lead Location: 753859
Implantable Lead Location: 753860
Implantable Pulse Generator Implant Date: 20231218
Lead Channel Impedance Value: 350 Ohm
Lead Channel Impedance Value: 400 Ohm
Lead Channel Pacing Threshold Amplitude: 1 V
Lead Channel Pacing Threshold Amplitude: 1 V
Lead Channel Pacing Threshold Pulse Width: 0.5 ms
Lead Channel Pacing Threshold Pulse Width: 0.5 ms
Lead Channel Sensing Intrinsic Amplitude: 0.3 mV
Lead Channel Sensing Intrinsic Amplitude: 12 mV
Lead Channel Setting Pacing Amplitude: 2.5 V
Lead Channel Setting Pacing Amplitude: 2.5 V
Lead Channel Setting Pacing Pulse Width: 0.5 ms
Lead Channel Setting Sensing Sensitivity: 4 mV
Pulse Gen Model: 2272
Pulse Gen Serial Number: 8128617

## 2023-11-21 ENCOUNTER — Encounter: Payer: Self-pay | Admitting: Internal Medicine

## 2023-11-28 ENCOUNTER — Ambulatory Visit: Attending: Internal Medicine

## 2023-11-28 DIAGNOSIS — I441 Atrioventricular block, second degree: Secondary | ICD-10-CM

## 2023-11-28 LAB — CUP PACEART INCLINIC DEVICE CHECK
Date Time Interrogation Session: 20250331161737
Implantable Lead Connection Status: 753985
Implantable Lead Connection Status: 753985
Implantable Lead Implant Date: 20150506
Implantable Lead Implant Date: 20150506
Implantable Lead Location: 753859
Implantable Lead Location: 753860
Implantable Pulse Generator Implant Date: 20231218
Pulse Gen Model: 2272
Pulse Gen Serial Number: 8128617

## 2023-11-28 NOTE — Progress Notes (Signed)
 Normal in-clinic dual chamber pacemaker check. Presenting Rhythm: AP/VP . Routine testing of thresholds, sensing, and impedance demonstrate stable parameters. Estimated longevity 5.3-5.9 years. Pt enrolled in remote follow-up.  Changes made to session: Changes made per Darrol Angel. Jude rep.  Advised aganist increasing RA sensitivity per Darrol Angel. Jude - Programmed from DDDR to DDIR  -PVAB programmed from 100 ms to 70 ms -AT detection rate programmed from 160 bpm to 180 bpm -PVARP programmed from 300 ms to 275 ms -RR PVARP/VREF programmed from medium to off

## 2023-11-30 ENCOUNTER — Telehealth: Payer: Self-pay | Admitting: Cardiovascular Disease

## 2023-11-30 NOTE — Telephone Encounter (Signed)
 Patient would like to do a Provider Switch from Dr Royann Shivers to Dr Herbie Baltimore. Please advise.

## 2023-11-30 NOTE — Telephone Encounter (Signed)
No objections

## 2023-12-01 NOTE — Telephone Encounter (Signed)
 I guess fine.  Not sure why

## 2023-12-20 ENCOUNTER — Other Ambulatory Visit (HOSPITAL_COMMUNITY): Payer: Self-pay | Admitting: Interventional Radiology

## 2023-12-20 ENCOUNTER — Telehealth (HOSPITAL_COMMUNITY): Payer: Self-pay

## 2023-12-20 DIAGNOSIS — I771 Stricture of artery: Secondary | ICD-10-CM

## 2023-12-20 NOTE — Telephone Encounter (Signed)
Called to schedule US carotid, no answer, left vm. AB

## 2023-12-27 ENCOUNTER — Other Ambulatory Visit (HOSPITAL_COMMUNITY): Payer: Self-pay | Admitting: Interventional Radiology

## 2023-12-27 ENCOUNTER — Telehealth (HOSPITAL_COMMUNITY): Payer: Self-pay

## 2023-12-27 DIAGNOSIS — I771 Stricture of artery: Secondary | ICD-10-CM

## 2023-12-27 NOTE — Telephone Encounter (Signed)
Called to schedule US carotid, no answer, left vm. AB

## 2024-01-01 NOTE — Addendum Note (Signed)
 Addended by: Lott Rouleau A on: 01/01/2024 05:09 AM   Modules accepted: Orders

## 2024-01-01 NOTE — Progress Notes (Signed)
 Remote pacemaker transmission.

## 2024-01-16 ENCOUNTER — Ambulatory Visit (HOSPITAL_COMMUNITY)
Admission: RE | Admit: 2024-01-16 | Discharge: 2024-01-16 | Disposition: A | Discharge status: Home / Self Care | Source: Ambulatory Visit | Attending: Interventional Radiology | Admitting: Interventional Radiology

## 2024-01-16 DIAGNOSIS — I771 Stricture of artery: Secondary | ICD-10-CM | POA: Diagnosis present

## 2024-01-18 ENCOUNTER — Telehealth (HOSPITAL_COMMUNITY): Payer: Self-pay

## 2024-01-18 NOTE — Telephone Encounter (Signed)
 Pt agreed to f/u in 6 months with a US  Carotid. AB

## 2024-02-01 ENCOUNTER — Telehealth: Payer: Self-pay | Admitting: Neurology

## 2024-02-01 NOTE — Telephone Encounter (Signed)
 Pt called to reschedule appt  due to knee surgery coming up .  Appt Rescheduled

## 2024-02-06 ENCOUNTER — Telehealth: Payer: Self-pay

## 2024-02-06 NOTE — Telephone Encounter (Signed)
   Pre-operative Risk Assessment    Patient Name: Mary [name]  DOB: [birth year] MRN: [id number]   Date of last office visit: 05/11/23 with Dr. Alvis Ba  Date of next office visit: 02/08/24 with Dr. Addie Holstein   Request for Surgical Clearance    Procedure:  Colonoscopy   Date of Surgery:  Clearance TBD                                Surgeon:  Dr. Koreen Person Surgeon's Group or Practice Name:  [facility] Phone number:  [phone] Fax number:  [phone]   Type of Clearance Requested:   - Medical  - Pharmacy:  Hold Apixaban  (Eliquis ) 2 days prior to procedure   Type of Anesthesia:  propofol     Additional requests/questions:    [name]   02/06/2024, 4:55 PM

## 2024-02-07 ENCOUNTER — Telehealth (HOSPITAL_COMMUNITY): Payer: Self-pay

## 2024-02-07 ENCOUNTER — Ambulatory Visit: Admitting: Neurology

## 2024-02-07 NOTE — Telephone Encounter (Signed)
   Name: [name]  DOB: Sep 11, 1940  MRN: [id number]  Primary Cardiologist: Mickiel Albany, MD (Inactive)  Chart reviewed as part of pre-operative protocol coverage. The patient has an upcoming visit scheduled with Dr. Addie Holstein on 02/08/2024 at which time clearance can be addressed in case there are any issues that would impact surgical recommendations.  Colonoscopy is not scheduled until TBD as below. I added preop FYI to appointment note so that provider is aware to address at time of outpatient visit.  Per office protocol the cardiology provider should forward their finalized clearance decision and recommendations regarding antiplatelet therapy to the requesting party below.    This message will also be routed to pharmacy pool for input on holding Eliquis  as requested below so that this information is available to the clearing provider at time of patient's appointment.   I will route this message as FYI to requesting party and remove this message from the preop box as separate preop APP input not needed at this time.   Please call with any questions.  Ava Boatman, NP  02/07/2024, 12:10 PM

## 2024-02-07 NOTE — Telephone Encounter (Signed)
 Called to ask question regarding medical form, no answer, left vm. AB

## 2024-02-08 ENCOUNTER — Ambulatory Visit: Admitting: Cardiology

## 2024-02-09 NOTE — Telephone Encounter (Signed)
 Patient with diagnosis of atrial fibrillation on Eliquid for anticoagulation.    Procedure:  Colonoscopy    Date of Surgery:  Clearance TBD      CHA2DS2-VASc Score = 7   This indicates a 11.2% annual risk of stroke. The patient's score is based upon: CHF History: 1 HTN History: 1 Diabetes History: 1 Stroke History: 0 Vascular Disease History: 1 Age Score: 2 Gender Score: 1   CrCl 49 Platelet count 425  Patient has not had an Afib/aflutter ablation within the last 3 months or DCCV within the last 30 days  Chart notes history of remote DVT  Per office protocol, patient can hold Eliquis  for 2 days prior to procedure.   Patient will not need bridging with Lovenox  (enoxaparin ) around procedure.  **This guidance is not considered finalized until pre-operative APP has relayed final recommendations.**

## 2024-02-13 NOTE — Telephone Encounter (Signed)
 Dr. Alvis Ba ~ Will patient still see you for pacemaker or needs another EP provider?

## 2024-02-14 ENCOUNTER — Ambulatory Visit (INDEPENDENT_AMBULATORY_CARE_PROVIDER_SITE_OTHER): Payer: Medicare Other

## 2024-02-14 ENCOUNTER — Telehealth: Payer: Self-pay | Admitting: Cardiology

## 2024-02-14 DIAGNOSIS — I442 Atrioventricular block, complete: Secondary | ICD-10-CM | POA: Diagnosis not present

## 2024-02-14 LAB — CUP PACEART REMOTE DEVICE CHECK
Battery Remaining Longevity: 65 mo
Battery Remaining Percentage: 81 %
Battery Voltage: 2.99 V
Brady Statistic AP VP Percent: 86 %
Brady Statistic AP VS Percent: 1 %
Brady Statistic AS VP Percent: 14 %
Brady Statistic AS VS Percent: 1 %
Brady Statistic RA Percent Paced: 86 %
Brady Statistic RV Percent Paced: 99 %
Date Time Interrogation Session: 20250617053216
Implantable Lead Connection Status: 753985
Implantable Lead Connection Status: 753985
Implantable Lead Implant Date: 20150506
Implantable Lead Implant Date: 20150506
Implantable Lead Location: 753859
Implantable Lead Location: 753860
Implantable Pulse Generator Implant Date: 20231218
Lead Channel Impedance Value: 350 Ohm
Lead Channel Impedance Value: 400 Ohm
Lead Channel Pacing Threshold Amplitude: 1 V
Lead Channel Pacing Threshold Amplitude: 1 V
Lead Channel Pacing Threshold Pulse Width: 0.5 ms
Lead Channel Pacing Threshold Pulse Width: 0.5 ms
Lead Channel Sensing Intrinsic Amplitude: 0.3 mV
Lead Channel Sensing Intrinsic Amplitude: 12 mV
Lead Channel Setting Pacing Amplitude: 2.5 V
Lead Channel Setting Pacing Amplitude: 2.5 V
Lead Channel Setting Pacing Pulse Width: 0.5 ms
Lead Channel Setting Sensing Sensitivity: 4 mV
Pulse Gen Model: 2272
Pulse Gen Serial Number: 8128617

## 2024-02-14 NOTE — Telephone Encounter (Signed)
 Pt c/o medication issue:  1. Name of Medication:   empagliflozin  (JARDIANCE ) 10 MG TABS tablet   2. How are you currently taking this medication (dosage and times per day)?   As prescribed  3. Are you having a reaction (difficulty breathing--STAT)?   4. What is your medication issue?    Husband Evan Hillock) stated patient is concerned she has been having numbness in left arm.  Husband stated patient slept on the arm but the numbness has not gone away.

## 2024-02-14 NOTE — Telephone Encounter (Signed)
 Spoke with patient, she reports she slept on her left arm last night. She woke up at 6 AM this morning with her left arm feeling numb. She reports arm remains numb with no improvement at time of call (going on 7 hours now).   She denies any vision changes, no tingling. She reports numbness is from under her arm near shoulder and down to fingertips.  Advised patient to contact neurologist's office to see what they advise, or if she can be seen in office. Also instructed patient to go to ED if unable to reach someone at neurologist's office to be evaluated. Patient verbalized understanding and states she will have her husband take her to the ED for evaluation, and will also call her neurologist's office. Patient expressed appreciation for call.

## 2024-02-16 ENCOUNTER — Ambulatory Visit: Payer: Self-pay | Admitting: Internal Medicine

## 2024-02-19 NOTE — Telephone Encounter (Signed)
That is up to the patient.

## 2024-02-20 ENCOUNTER — Encounter: Payer: Self-pay | Admitting: Cardiology

## 2024-02-20 ENCOUNTER — Ambulatory Visit: Attending: Cardiology | Admitting: Cardiology

## 2024-02-20 ENCOUNTER — Telehealth: Payer: Self-pay

## 2024-02-20 VITALS — BP 118/60 | HR 75 | Ht 67.0 in | Wt 190.2 lb

## 2024-02-20 DIAGNOSIS — E1169 Type 2 diabetes mellitus with other specified complication: Secondary | ICD-10-CM

## 2024-02-20 DIAGNOSIS — Z01818 Encounter for other preprocedural examination: Secondary | ICD-10-CM

## 2024-02-20 DIAGNOSIS — Z950 Presence of cardiac pacemaker: Secondary | ICD-10-CM

## 2024-02-20 DIAGNOSIS — G4733 Obstructive sleep apnea (adult) (pediatric): Secondary | ICD-10-CM

## 2024-02-20 DIAGNOSIS — I771 Stricture of artery: Secondary | ICD-10-CM

## 2024-02-20 DIAGNOSIS — I10 Essential (primary) hypertension: Secondary | ICD-10-CM

## 2024-02-20 DIAGNOSIS — Z7901 Long term (current) use of anticoagulants: Secondary | ICD-10-CM

## 2024-02-20 DIAGNOSIS — I480 Paroxysmal atrial fibrillation: Secondary | ICD-10-CM | POA: Diagnosis not present

## 2024-02-20 DIAGNOSIS — N1832 Chronic kidney disease, stage 3b: Secondary | ICD-10-CM

## 2024-02-20 DIAGNOSIS — I2510 Atherosclerotic heart disease of native coronary artery without angina pectoris: Secondary | ICD-10-CM

## 2024-02-20 DIAGNOSIS — I5032 Chronic diastolic (congestive) heart failure: Secondary | ICD-10-CM

## 2024-02-20 DIAGNOSIS — I442 Atrioventricular block, complete: Secondary | ICD-10-CM

## 2024-02-20 DIAGNOSIS — E785 Hyperlipidemia, unspecified: Secondary | ICD-10-CM

## 2024-02-20 DIAGNOSIS — Z01810 Encounter for preprocedural cardiovascular examination: Secondary | ICD-10-CM | POA: Diagnosis not present

## 2024-02-20 NOTE — Telephone Encounter (Signed)
 Alert remote transmission: exceeded AF burden  Several AT/AF events for 11% since 3/31.  Presents in an atrial arrhythmia with intermittent P wave undersensing and inappropriate atrial pacing.  Also observed on stored EGMs.   Increase in AF burden via trending.  Burden may be under reported by device.  Pacing mode is DDIR.  Known PAF, On OAC per EMR.    Patient is also due for follow up: appt made with Dr. Waddell for 7/21 at 915am (please make patient aware)  and may need device programming adjustments for improved sensitivity.   LM for patient to call back.  Assess symptoms and give appt info.

## 2024-02-20 NOTE — Telephone Encounter (Addendum)
 Spoke with patient.  She is doing okay but is aware that she is in and out of AF.  She has been having multiple knee injections to carry her over until she gets her knee replaced.    Patient aware that if symptoms progress to call us  for sooner appointment before her Julay 21st with Dr. Waddell.   See March OV note in Paceart regarding awareness and programming changes to address known atrial undersensing.

## 2024-02-20 NOTE — Patient Instructions (Addendum)
 Medication Instructions:    May take an extra dose of Torsemide  if you have day when your leg is swollen.   *If you need a refill on your cardiac medications before your next appointment, please call your pharmacy*  Other Instructions    From a cardiac standpoint - you are cleared  intermediated risk from  low risk surgery _ knee surgery You will need to contact Dr Rosemarie  or Dr Clydia   concerning stopping or continuing BRILINTA  for surgery  Will need  to  [facility] Device clinic  when surgery is set  for Pacemaker instructions.  Lab Work: Not needed    Testing/Procedures: Not needed  Follow-Up: At [facility], you and your health needs are our priority.  As part of our continuing mission to provide you with exceptional heart care, we have created designated Provider Care Teams.  These Care Teams include your primary Cardiologist (physician) and Advanced Practice Providers (APPs -  Physician Assistants and Nurse Practitioners) who all work together to provide you with the care you need, when you need it.     Your next appointment:   9 month(s)  The format for your next appointment:   In Person  Provider:   Alm Clay, MD   Other Instructions    From a cardiac standpoint - you are cleared  intermediated risk from  low risk surgery _ knee surgery You will need to contact Dr Rosemarie  or Dr Clydia   concerning stopping or continuing BRILINTA  for surgery  Will need  to  [facility] Device clinic  when surgery is set  for Pacemaker instructions.

## 2024-02-20 NOTE — Telephone Encounter (Signed)
 Pt requesting a c/b from the nurse. Please advise

## 2024-02-20 NOTE — Progress Notes (Unsigned)
 Cardiology Office Note:  .   Date:  02/21/2024  ID:  Mary [name], DOB September 04, 1940, MRN [id number] PCP: Esmeralda Morton Mary [name]  [facility] Providers Cardiologist:  Alm Clay, MD Electrophysiologist:  Danelle Birmingham, MD     Chief Complaint  Patient presents with   Follow-up    Patient requested change to cardiologist.  Here for preop CV assessment   Atrial Fibrillation    No breakthrough episodes    Patient Profile: .     Mary [name] is an obese 83 y.o. female  with a PMH notable for chronic HFpEF, paroxysmal atrial tachycardia/PAF with 2  AVB-3 AVB (s/p PPM 2015-replaced GEN change in 2023), DM-2 with neuropathy.  History of DVT, OSA, nonobstructive CAD by cath, history of GOP surgery who presents here for PreOperative EValuation at the request of Esmeralda Morton JONELLE, PA-C.  She is a former patient of Dr. Victory Sharps, who was seen once by Dr. Francyne but then asked to switch to me.    ESMERELDA FINNIGAN was last seen on 05/11/2023 by Dr. Jerel Francyne.  She had had an ER visit to [facility] for edema.  No evidence of DVT.  She was given a dose of Lasix  and symptoms improved.  Since then she denied any PND orthopnea or worsening edema.  No signs of A-fib.  No chest pain.  PPM showed 8% A-fib burden-noted that she was not routinely using CPAP for OSA.  Was having some issues with having her levothyroxine  dose adjusted.>  Noted worsening edema with minimal BMP evaluation.  No signs of CHF on chest x-ray.  Unfortunate had not used CPAP.  2D echo ordered to reassess pulmonary pressures as a previously shown severe pulmonary hypertension.  Recommend daily weights and prescribed PRN diuretic.  Strongly recommended sleep study evaluation.  Felt to have more prominent atrial tachycardia than A-fib.  Continue rate control.  -> Following this visit the patient requested a change of cardiologist.?    Subjective  Discussed the use of AI scribe software for clinical note  transcription with the patient, who gave verbal consent to proceed.  History of Present Illness History of Present Illness Mary [name] is an 83 year old female with carotid artery disease. ? HFpEF, s/p PPM (with PAF) and diabetes who presents for preoperative cardiac evaluation for knee surgery.  She is scheduled for knee surgery due to significant knee pain from osteoarthritis, which has worsened over the past year, severely limiting her mobility. She uses a brace, walker, and cane for assistance and struggles to walk more than ten feet. Previous injections provided only short-term relief.  She has a history of carotid artery disease and underwent stent-assisted angioplasty of the left subclavian artery in November 2024. Recent carotid dopplers showed 40-50% narrowing of the right internal carotid artery and minimal plaque on the left. Vertebral arteries and subclavians appeared normal. She has experienced stroke-like symptoms in the past but has no confirmed stroke diagnosis.  She has a pacemaker and is mostly paced, with no recent atrial fibrillation episodes. She is on Eliquis  for atrial fibrillation and Brilinta  following her carotid procedure. She takes torsemide  20 mg as needed for leg swelling, which she manages with compression stockings. Recently, she has experienced increased swelling and difficulty using her hands due to numbness after sleeping on her arms.  Her diabetes is managed with two types of insulin , one fast-acting and one slow-acting. Her last A1c was 7.8 in May 2025, showing improvement  from previous levels. She uses a sliding scale for insulin  dosing and reports significant weight loss recently.  She has a history of sleep apnea and uses a CPAP machine, although it has been misplaced recently. No current chest pain, pressure, or shortness of breath. She sleeps in a recliner for comfort rather than due to breathing issues.   Cardiovascular ROS: positive for - edema and she  really is not very active now because her knee pain.  Unable to fully assess her mobility and symptoms but denies any chest pain or pressure; she sleeps on a recliner but denies any orthopnea.  She says that this is more because of comfort. negative for - chest pain, irregular heartbeat, palpitations, paroxysmal nocturnal dyspnea, rapid heart rate, or syncope or near syncope, TIA or amaurosis fugax, claudication  ROS:  Review of Systems - Negative except her most major complaint being her knee pain.    Objective   Lives independently along with her husband but she is limited physically by her knee pain.  They were looking into assisted living.  Studies Reviewed: SABRA   EKG Interpretation Date/Time:  Monday February 20 2024 08:04:43 EDT Ventricular Rate:  75 PR Interval:  174 QRS Duration:  188 QT Interval:  464 QTC Calculation: 518 R Axis:   -67  Text Interpretation: AV dual-paced rhythm When compared with ECG of 30-May-2023 14:43, PREVIOUS ECG IS PRESENT Confirmed by Anner Lenis ([id number]) on 02/20/2024 8:23:08 AM    ECHO: Normal LV size and function with no RWMA.  Indeterminate diastolic parameters.  EF 55%.  Mild biatrial enlargement.  PAP mildly elevated at 37 mmHg.  Trivial MR.  Moderate-severe MAC.  AV calcification with no stenosis.  Mildly elevated PAP at 8 mm record.  (05/27/2023) Echo: EF 40 to 45% with diffuse HK.  Mild LA dilation.  PAP estimated at 74 mmHg.  Mild AoV stenosis CATH 05/29/2018: Proximal-ostial 25% LM.  Proximal-mid LAD 20 to 30% and distal--apical LAD 50 to 70%.  Stable if not improved from previous.  OM stenoses of 40 to 50%.  Diffuse irregularities in the RCA.  LV dyssynergy due to RV pacing.  EF estimated 50%.  Normal LVEDP. => Recommended optimization of medical therapy.   Status post L4 with cerebral arteriogram. 06/21/2023    Left CFA approach.   Findings.   1.  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 2.   High-grade stenosis of the proximal right ICA (80%) just distal to the right carotid bulb. 3.  Severe focal stenosis of the nondominant left VBJ proximal to the origin of the left posterior inferior cerebral artery fills retrogradely from the right vertebral artery. 4.  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   S Deveshwar MD.  Staged L Subclavian PTA = 07/06/2023 => 7 mm x 24 mm Palmaz balloon mounted stent just proximal to origin of left subclavian artery. Placed on Brilinta  90 mg twice daily in addition to DOAC  Carotid Doppler: Right internal carotid artery 40-50% narrowing, left internal carotid artery minimal plaque, right vertebral artery normal, left vertebral artery retrograde flow, subclavian arteries normal (12/2023)   Lab Results  Component Value Date   NA 135 07/23/2023   K 3.6 07/23/2023   CREATININE 1.38 (H) 07/23/2023   GFRNONAA 38 (L) 07/23/2023   GLUCOSE 152 (H) 07/23/2023   Lab Results  Component Value Date   CHOL 138 06/01/2023  HDL 40 06/01/2023   LDLCALC 72 06/01/2023   LDLDIRECT 152.0 09/27/2022   TRIG 151 (H) 06/01/2023   CHOLHDL 3.5 06/01/2023   Lab Results  Component Value Date   HGBA1C 8.8 (H) 06/01/2023    Risk Assessment/Calculations:    CHA2DS2-VASc Score = 7   This indicates a 11.2% annual risk of stroke. The patient's score is based upon: CHF History: 1 HTN History: 1 Diabetes History: 1 Stroke History: 0 Vascular Disease History: 1 Age Score: 2 Gender Score: 1            Ms. Sunderland's perioperative risk of a major cardiac event is 6.6% according to the Revised Cardiac Risk Index (RCRI).  Therefore, she is at high risk for perioperative complications based upon insulin -dependent diabetes, and cerebrovascular disease.  Does not have active CAD or CHF symptoms.    Her functional capacity is limited by her knee pain and not cardiac status. at 3.63 METs  according to the Duke Activity Status Index (DASI). Recommendations: The patient is at high risk for perioperative cardiac complications and is at a low functional capacity.  However, further testing will not change how her cardiac status is managed.  Proceed with surgery at high risk if there are no other options for treatment. Antiplatelet and/or Anticoagulation Recommendations: Is currently on Brilinta -will need to be discussed with vascular radiology- Dr. Dolphus Eliquis  (Apixaban ) can be held for 2-3 days prior to surgery.  Please resume post op when felt to be safe.         Physical Exam:   VS:  BP 118/60 (BP Location: Left Arm, Patient Position: Sitting, Cuff Size: Normal)   Pulse 75   Ht 5' 7 (1.702 m)   Wt 190 lb 3.2 oz (86.3 kg)   SpO2 93%   BMI 29.79 kg/m    Wt Readings from Last 3 Encounters:  02/20/24 190 lb 3.2 oz (86.3 kg)  07/06/23 198 lb (89.8 kg)  06/21/23 199 lb (90.3 kg)    GEN: Well nourished, well groomed in no acute distress; borderline obese, chronically ill-appearing. NECK: No JVD; soft right-sided carotid bruit. CARDIAC:  RRR.  Normal S1, S2;, 1/6 SEM at RUSB.  No, rubs, gallops RESPIRATORY:  Clear to auscultation without rales or rhonchi ; mild late expiratory wheeze.  Nonlabored, good air movement. ABDOMEN: Soft, non-tender, non-distended EXTREMITIES: Trace to 1+ bilateral LE edema; No deformity      ASSESSMENT AND PLAN: .    Problem List Items Addressed This Visit       Cardiology Problems   Chronic diastolic congestive heart failure, NYHA class 2 (HCC) (Chronic)   Overall improved EF on follow-up echocardiogram with EF now roughly 55%.  Pulmonary artery pressures also improved.  Without true orthopnea or PND, is difficult to say that her edema is completely cardiac in nature.  I suspect there is a component of venous stasis as well.  This is probably exacerbated by OSA not on CPAP. - Recommend taking additional doses of torsemide  PRN additional  swelling or weight gain more than 3 pounds in 1 day or 5 pounds in a week. - Recommend that she tries to wear support hose which she has not been able to wear because of paresthesias in her left hand making it difficult to wear now. - She is also on empagliflozin  10 mg daily for combination of diabetes and heart failure - With preserved EF and A-fib she is on Lopressor  25 mg twice daily along with spironolactone  12.5 mg daily.  Coronary artery disease involving native coronary artery of native heart without angina pectoris (Chronic)   Nonobstructive CAD with no active anginal symptoms.  She is appropriately on beta-blocker and high-dose statin.  Not on aspirin  because she is on Eliquis  but she is also now on Brilinta  for her recent subclavian stent.  - In the absence of active anginal symptoms, no additional testing required for preop assessment.      Relevant Orders   VAS US  PAD ABI   Hyperlipidemia associated with type 2 diabetes mellitus (HCC) (Chronic)   Labs from October were relatively well-controlled with an LDL of 72.  Would like to see LDL less than 55, but with multiple ongoing issues, we will hold off on additional therapy for now.  Continue Lipitor  80 mg daily.  Diabetes managed by PCP.  Currently on Lantus  plus Humalog insulin  along with Jardiance .      Hypertension (Chronic)   BP pretty much stable on Lopressor  25 mg twice daily.  Not currently on ARB which would be the next option if BP were to be elevated.      PAF (paroxysmal atrial fibrillation) (HCC) (Chronic)   Relative low burden on PPM evaluation in the past.  She is not aware of any symptoms.  He is on Lopressor  25 mg twice daily for rate control and Eliquis  for anticoagulation. -Okay to hold Eliquis  for procedures 2 to 3 days preop      Subclavian artery stenosis, left (HCC) (Chronic)   Status post PTA-stent placement in November 2024 by Dr. Dolphus -> was placed on Brilinta  90 mg twice daily. -Would  defer duration of therapy to Dr. Dolphus => he would also be the one who we decide the safety of holding Brilinta  for potential knee surgery.  Traditionally, high intensity Thienopyridine's are not used in the setting of DOAC, would potentially recommend switching to Plavix if long-term antiplatelet agent is warranted..  Would still need his approval for holding preop.        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  From EP - Dr. Waddell for last procedure: Have magnet available. Provide continuous ECG monitoring when magnet is used or reprogramming is to be performed.  Procedure will likely interfere with device function.  Device should be programmed:  Asynchronous pacing during procedure and returned to normal programming after procedure      Chronic anticoagulation   She is on Eliquis  for PAF.  Previous evaluation has not indicated significant A-fib burden, but she has an elevated CHA2DS2-VASc score and therefore is on long-term Eliquis .  Concern being on Eliquis  plus Brilinta .  Defer to vascular radiology.  Okay to hold Eliquis  2 to 3 days preop for surgeries or procedures.      CKD (chronic kidney disease) stage 3, GFR 30-59 ml/min (HCC) (Chronic)   CKD noted but creatinine at 1.38 would not place her at higher risk for surgery.      OSA (obstructive sleep apnea) (Chronic)   Sleep apnea previously managed with CPAP, non-use may contribute to symptoms. - Re-evaluate for CPAP use, consider updating equipment. - Consult with sleep medicine or pulmonary specialist for CPAP management.  Will defer to the patient if she would want to see our sleep study physician or be followed up elsewhere.  Will be happy to put in referral upon follow-up visit.      Other Visit Diagnoses  Preoperative cardiovascular examination    -  Primary   Relevant Orders   EKG 12-Lead (Completed)             Follow-Up: Return in about 9 months (around 11/19/2024) for Routine follow up with me, [employer].  Total time spent: 39 min spent with patient + 35 min spent charting = 76 min I spent 74 minutes in the care of Mary [name] today including reviewing labs (1 minute), reviewing studies (10 minutes reviewing prior cath results-films and reports, echocardiograms as well as cerebrovascular procedural notes and Doppler results.), face to face time discussing treatment options (39), reviewing records from recent visit with Dr. Francyne as well as prior visits with Dr. Claudene, and Dr. Waddell (11 minutes), 15 minutes dictating, medical decision making, and documenting in the encounter.     Signed, Alm MICAEL Clay, MD, MS Alm Clay, M.D., M.S. Interventional Chartered certified accountant  Pager # [phone]

## 2024-02-21 ENCOUNTER — Encounter: Payer: Self-pay | Admitting: Cardiology

## 2024-02-21 DIAGNOSIS — Z01810 Encounter for preprocedural cardiovascular examination: Secondary | ICD-10-CM | POA: Insufficient documentation

## 2024-02-21 NOTE — Assessment & Plan Note (Signed)
 Chronic osteoarthritis with severe pain and mobility impairment. Knee replacement necessary to prevent further deterioration.  Knee arthroplasty is a low risk noncardiac surgery. No active angina or heart failure symptoms.  CKD noted but creatinine is<2.  She does have diabetes on insulin  and does have cerebrovascular disease..  Mary [name]'s perioperative risk of a major cardiac event is 6.6% according to the Revised Cardiac Risk Index (RCRI).  Therefore, she is at high/intermediate risk for perioperative complications based upon insulin -dependent diabetes, and cerebrovascular disease.  Does not have active CAD or CHF symptoms.    Her functional capacity is limited by her knee pain and not cardiac status. at 3.63 METs according to the Duke Activity Status Index (DASI). Recommendations: The patient is at high (intermediate) risk for perioperative cardiac complications and is at a low functional capacity.  However, further testing will not change how her cardiac status is managed.  Proceed with surgery at high risk if there are no other options for treatment. ->  Provided her A1c is well-controlled, and with recent carotid Doppler results, the relative risks for these 2 risk factors have been reduced.  She is also on beta-blocker which mitigates the risk from 6% to roughly 3%. Antiplatelet and/or Anticoagulation Recommendations: Is currently on Brilinta -will need to be discussed with vascular radiology- Dr. Dolphus Eliquis  (Apixaban ) can be held for 2-3 days prior to surgery.  Please resume post op when felt to be safe.    - Proceed with knee replacement after preoperative clearance from vascular radiology. - Coordinate with orthopedic surgeon for scheduling and preoperative instructions. - Per orthopedic surgery, suggestion is to wait until A1c below 7.5 for optimal wound healing. - Discuss pain management options with primary care or orthopedic surgeon.

## 2024-02-21 NOTE — Assessment & Plan Note (Addendum)
 Labs from October were relatively well-controlled with an LDL of 72.  Would like to see LDL less than 55, but with multiple ongoing issues, we will hold off on additional therapy for now.  Continue Lipitor  80 mg daily.  Diabetes managed by PCP.  Currently on Lantus  plus Humalog insulin  along with Jardiance .

## 2024-02-21 NOTE — Assessment & Plan Note (Addendum)
-   Coordinate with electrophysiology team for pacemaker management during surgery.  From EP - Dr. Waddell for last procedure: Have magnet available. Provide continuous ECG monitoring when magnet is used or reprogramming is to be performed.  Procedure will likely interfere with device function.  Device should be programmed:  Asynchronous pacing during procedure and returned to normal programming after procedure

## 2024-02-21 NOTE — Assessment & Plan Note (Signed)
 Relative low burden on PPM evaluation in the past.  She is not aware of any symptoms.  He is on Lopressor  25 mg twice daily for rate control and Eliquis  for anticoagulation. -Okay to hold Eliquis  for procedures 2 to 3 days preop

## 2024-02-21 NOTE — Assessment & Plan Note (Signed)
 Status post PTA-stent placement in November 2024 by Dr. Dolphus -> was placed on Brilinta  90 mg twice daily. -Would defer duration of therapy to Dr. Dolphus => he would also be the one who we decide the safety of holding Brilinta  for potential knee surgery.  Traditionally, high intensity Thienopyridine's are not used in the setting of DOAC, would potentially recommend switching to Plavix if long-term antiplatelet agent is warranted..  Would still need his approval for holding preop.

## 2024-02-21 NOTE — Assessment & Plan Note (Signed)
 Sleep apnea previously managed with CPAP, non-use may contribute to symptoms. - Re-evaluate for CPAP use, consider updating equipment. - Consult with sleep medicine or pulmonary specialist for CPAP management.  Will defer to the patient if she would want to see our sleep study physician or be followed up elsewhere.  Will be happy to put in referral upon follow-up visit.

## 2024-02-21 NOTE — Assessment & Plan Note (Signed)
 She is on Eliquis  for PAF.  Previous evaluation has not indicated significant A-fib burden, but she has an elevated CHA2DS2-VASc score and therefore is on long-term Eliquis .  Concern being on Eliquis  plus Brilinta .  Defer to vascular radiology.  Okay to hold Eliquis  2 to 3 days preop for surgeries or procedures.

## 2024-02-21 NOTE — Assessment & Plan Note (Signed)
 Nonobstructive CAD with no active anginal symptoms.  She is appropriately on beta-blocker and high-dose statin.  Not on aspirin  because she is on Eliquis  but she is also now on Brilinta  for her recent subclavian stent.  - In the absence of active anginal symptoms, no additional testing required for preop assessment.

## 2024-02-21 NOTE — Assessment & Plan Note (Signed)
 CKD noted but creatinine at 1.38 would not place her at higher risk for surgery.

## 2024-02-21 NOTE — Assessment & Plan Note (Signed)
 BP pretty much stable on Lopressor  25 mg twice daily.  Not currently on ARB which would be the next option if BP were to be elevated.

## 2024-02-21 NOTE — Assessment & Plan Note (Signed)
 Overall improved EF on follow-up echocardiogram with EF now roughly 55%.  Pulmonary artery pressures also improved.  Without true orthopnea or PND, is difficult to say that her edema is completely cardiac in nature.  I suspect there is a component of venous stasis as well.  This is probably exacerbated by OSA not on CPAP. - Recommend taking additional doses of torsemide  PRN additional swelling or weight gain more than 3 pounds in 1 day or 5 pounds in a week. - Recommend that she tries to wear support hose which she has not been able to wear because of paresthesias in her left hand making it difficult to wear now. - She is also on empagliflozin  10 mg daily for combination of diabetes and heart failure - With preserved EF and A-fib she is on Lopressor  25 mg twice daily along with spironolactone  12.5 mg daily.

## 2024-02-25 ENCOUNTER — Encounter (HOSPITAL_COMMUNITY): Payer: Self-pay | Admitting: Interventional Radiology

## 2024-02-28 ENCOUNTER — Telehealth: Payer: Self-pay | Admitting: Cardiology

## 2024-02-28 DIAGNOSIS — I771 Stricture of artery: Secondary | ICD-10-CM

## 2024-02-28 DIAGNOSIS — R200 Anesthesia of skin: Secondary | ICD-10-CM

## 2024-02-28 NOTE — Telephone Encounter (Signed)
Attempted to call pt, unable to reach. LMTCB.

## 2024-02-28 NOTE — Telephone Encounter (Signed)
  Patient is stating that she has had left arm numbness for about a month. States that started before she saw Dr Anner back on 02/20/24. She also states that she feels tired, dizziness, and headaches that comes and goes. She states she is unsteady when she walks. Pt denies any chest pain or shortness of breath.

## 2024-02-29 NOTE — Telephone Encounter (Signed)
   Patient Name: Mary [name]  DOB: Feb 05, 1941 MRN: [id number]  Primary Cardiologist: Alm Clay, MD  Chart reviewed as part of pre-operative protocol coverage. Given past medical history and time since last visit, based on ACC/AHA guidelines, [name] is at acceptable risk for the planned procedure without further cardiovascular testing.   Per Dr. 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   From EP - Dr. Waddell for last procedure: Have magnet available. Provide continuous ECG monitoring when magnet is used or reprogramming is to be performed.  Procedure will likely interfere with device function.  Device should be programmed:  Asynchronous pacing during procedure and returned to normal programming after procedure     Okay to hold Eliquis  2 to 3 days preop for surgeries or procedures.   The patient was advised that if she develops new symptoms prior to surgery to contact our office to arrange for a follow-up visit, and she verbalized understanding.  I will route this recommendation to the requesting party via Epic fax function and remove from pre-op pool.  Please call with questions.  Lamarr Satterfield, NP 02/29/2024, 1:32 PM

## 2024-02-29 NOTE — Telephone Encounter (Signed)
 Resent clearance note below to Dr. Lauralee office with pacemaker recommendations.

## 2024-02-29 NOTE — Telephone Encounter (Signed)
 Requesting office sent a duplicate stating 2nd request. Original received 02/06/24. Pt was seen by Dr. Anner 02/20/24. I have read notes that indicate the pt can hold blood thinner, though do not see that is noted pt has been cleared.   I will forward this the preop APP to review.

## 2024-03-05 ENCOUNTER — Telehealth: Payer: Self-pay | Admitting: Neurology

## 2024-03-05 NOTE — Telephone Encounter (Signed)
 Pt called asking if her f/u can be sooner than later since she has been waiting for months to be seen, Augustin, Rn gave the ok for phone rep to offer 09-02 9:00 appointment, 8:30 check in, pt accepted with wait list

## 2024-03-06 NOTE — Telephone Encounter (Signed)
 Patient reports her left arm is numb from shoulder all the way to hand and has been since 2-3 weeks before he saw Dr. Anner.  Dr. Bernadine did surgery on several arteries and left arm was one of them.  This was for subclavian artery stenosis.  Most recently was 07/2023.  Numbness is new since mid-June.   Extremity is cooler to touch than right arm.  No pain.  She was having strokes like symptoms prior to that.   Not having these episodes currently.  At first couldn't lift anything up w her hand and now she can lift it but will not try to carry anything w it like a cup of coffee because she would surely drop it.  PCP recommended neurology for this issue.  That appointment is with Dr. Rosemarie on 9/2 and is on wait list.  I asked her to call Dr. Jammie office and discuss symptoms w their office.  She is seeing Dr. Waddell in about 2 weeks.      I know Dr. Claudene would not have let this go and I need some help.

## 2024-03-07 NOTE — Addendum Note (Signed)
 Addended by: GLADIS REENA GAILS on: 03/07/2024 04:58 PM   Modules accepted: Orders

## 2024-03-07 NOTE — Telephone Encounter (Signed)
 Rn called patient is aware of appt will be schedule for upper extremity doppler  due to subclavian artery stenosis  Dr Francyne initial order test but  discussing with patient . Patient had ask for provider switch( 11/30/23) which was granted.  Dr Anner is now her primary cardiologist. RN discussed with Dr Anner, the test will be placed under Dr Anner as ordering provider

## 2024-03-07 NOTE — Telephone Encounter (Signed)
 Can we please do a subclavian artery Doppler US  for subclavian artery stenosis

## 2024-03-19 ENCOUNTER — Ambulatory Visit: Admitting: Internal Medicine

## 2024-03-26 ENCOUNTER — Encounter (HOSPITAL_COMMUNITY): Payer: Self-pay

## 2024-03-28 ENCOUNTER — Encounter (HOSPITAL_COMMUNITY): Payer: Self-pay

## 2024-04-24 ENCOUNTER — Encounter: Payer: Self-pay | Admitting: *Deleted

## 2024-04-26 NOTE — Progress Notes (Signed)
 Remote pacemaker transmission.

## 2024-05-01 ENCOUNTER — Telehealth: Payer: Self-pay | Admitting: Neurology

## 2024-05-01 ENCOUNTER — Ambulatory Visit: Admitting: Neurology

## 2024-05-01 NOTE — Telephone Encounter (Signed)
  Pt called to cancel appt today with DR. Sethi at 9:00, due to not being able to walk this morning ,

## 2024-05-08 ENCOUNTER — Other Ambulatory Visit: Payer: Self-pay | Admitting: Internal Medicine

## 2024-05-14 ENCOUNTER — Encounter (HOSPITAL_BASED_OUTPATIENT_CLINIC_OR_DEPARTMENT_OTHER)

## 2024-05-14 ENCOUNTER — Encounter (HOSPITAL_BASED_OUTPATIENT_CLINIC_OR_DEPARTMENT_OTHER): Payer: Self-pay

## 2024-05-15 ENCOUNTER — Ambulatory Visit: Payer: Medicare Other | Attending: Cardiovascular Disease

## 2024-05-15 DIAGNOSIS — I480 Paroxysmal atrial fibrillation: Secondary | ICD-10-CM | POA: Diagnosis not present

## 2024-05-16 ENCOUNTER — Telehealth: Payer: Self-pay | Admitting: Cardiology

## 2024-05-16 LAB — CUP PACEART REMOTE DEVICE CHECK
Battery Remaining Longevity: 63 mo
Battery Remaining Percentage: 77 %
Battery Voltage: 2.99 V
Brady Statistic AP VP Percent: 82 %
Brady Statistic AP VS Percent: 1 %
Brady Statistic AS VP Percent: 17 %
Brady Statistic AS VS Percent: 1 %
Brady Statistic RA Percent Paced: 83 %
Brady Statistic RV Percent Paced: 99 %
Date Time Interrogation Session: 20250916020019
Implantable Lead Connection Status: 753985
Implantable Lead Connection Status: 753985
Implantable Lead Implant Date: 20150506
Implantable Lead Implant Date: 20150506
Implantable Lead Location: 753859
Implantable Lead Location: 753860
Implantable Pulse Generator Implant Date: 20231218
Lead Channel Impedance Value: 350 Ohm
Lead Channel Impedance Value: 410 Ohm
Lead Channel Pacing Threshold Amplitude: 1 V
Lead Channel Pacing Threshold Amplitude: 1 V
Lead Channel Pacing Threshold Pulse Width: 0.5 ms
Lead Channel Pacing Threshold Pulse Width: 0.5 ms
Lead Channel Sensing Intrinsic Amplitude: 0.3 mV
Lead Channel Sensing Intrinsic Amplitude: 12 mV
Lead Channel Setting Pacing Amplitude: 2.5 V
Lead Channel Setting Pacing Amplitude: 2.5 V
Lead Channel Setting Pacing Pulse Width: 0.5 ms
Lead Channel Setting Sensing Sensitivity: 4 mV
Pulse Gen Model: 2272
Pulse Gen Serial Number: 8128617

## 2024-05-16 MED ORDER — NITROGLYCERIN 0.4 MG SL SUBL: 0.4000 mg | SUBLINGUAL_TABLET | SUBLINGUAL | 2 refills | Status: AC | PRN

## 2024-05-16 NOTE — Telephone Encounter (Signed)
 Medication refill request completed and sent to [facility] pharmacy per patients request.

## 2024-05-16 NOTE — Telephone Encounter (Signed)
*  STAT* If patient is at the pharmacy, call can be transferred to refill team.   1. Which medications need to be refilled? (please list name of each medication and dose if known)   nitroGLYCERIN  (NITROSTAT ) 0.4 MG SL tablet     2. Would you like to learn more about the convenience, safety, & potential cost savings by using the [facility]? No   3. Are you open to using the [facility] (Type [facility]. No   4. Which pharmacy/location (including street and city if local pharmacy) is medication to be sent to? [facility] [id number] - [place], [place] - [address]     5. Do they need a 30 day or 90 day supply? 30 day

## 2024-05-18 ENCOUNTER — Ambulatory Visit: Payer: Self-pay | Admitting: Internal Medicine

## 2024-05-21 NOTE — Progress Notes (Deleted)
 [facility] [address] [place]. [place] [address] [phone]       OFFICE CONSULT NOTE  [name] Date of Birth:  05-16-1941 Medical Record Number:  [id number]    Primary neurologist: Dr. Rosemarie Reason for visit: Strokelike episodes   No chief complaint on file.     HPI:   Update 05/22/2024 JM: Patient returns for follow-up visit.  Unable to complete MRI brain due to pacemaker.  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  She underwent uncomplicated revascularization of symptomatic preocclusive stenosis of proximal left subclavian artery with stent assisted angioplasty in 07/2023 by Dr. Dolphus.  She was placed on Brilinta  post stent in addition to Eliquis , follow up ultrasound 12/2023 showed right ICA 40 to 59% stenosis, near normal right ICA, R VA antegrade flow, L VA retrograde flow and normal subclavian arteries.      Consult visit 06/01/2023 Dr. Rosemarie: [name] is a pleasant 83 year old Caucasian lady seen today for initial office consultation visit.  She is accompanied by her husband today.  History is obtained from them and review of electronic medical records and I personally reviewed pertinent available imaging films in PACS.  She has past medical history of diabetes, hypertension, hyperlipidemia, coronary artery disease, paroxysmal A-fib, status post pacemaker, arthritis, glaucoma, DVT and diabetic peripheral neuropathy.  She states she has had 4 episodes of recurrence severe headaches accompanied by blurred vision and confusion.  The first episode occurred in 2016.  She describes sudden onset of severe headaches involving her vertex along with significant blurred vision  difficulty to read.  She also had trouble figuring out how to use her phone.  She had trouble thinking as well.  Episode lasted about 30 minutes or so she was seen in the ER and had a CT scan done on 01/27/2015 which showed no acute abnormality.  She did well for couple of years but had a second episode 2 years ago when she developed sudden onset of headache, confusion, difficulty seeing and inability to use her blood glucose machine.  There is no accompanying nausea vomiting light sensitivity, vertigo, focal extremity weakness numbness or speech difficulties.  This episode lasted less than 30 minutes and resolved and she did not seek medical help.  The third episode occurred 5 days ago when she woke up in the morning felt okay but when she went to the kitchen she developed a blinding headache which was quite severe and she had trouble seeing and had trouble thinking.  Also lasted less than 30 minutes and recovered.  She denied any slurred speech, vertigo, double vision or extremity weakness or numbness.  Last episode occurred on September 30 prompting her to go to the emergency room.  She had had a thorough evaluation in the and MRI scan scan could not be done because she has a pacemaker.  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  There is 70% stenosis of proximal right ICA and 50% stenosis of distal left ICA.  Patient does have severe arthritis and gets steroid injections in her knees.  She is a fall risk and has had several falls and ER  visits.  She is on long-term Eliquis  for his atrial fibrillation and history of DVT.  She denies any prior history of strokes TIAs or significant neurological problems.  She has mild neuropathic symptoms in the legs but they have been stable.  She does ambulate with a cane and uses a wheelchair for long distances.  She has no prior  history of migraine headaches or similar headaches.  ROS:   14 system review of systems is positive for arthritis, knee pain, difficulty walking, headache, blurred vision, confusion, disorientation, short-term memory difficulties and all other systems negative  PMH:  Past Medical History:  Diagnosis Date   Arthritis    Atrial tachycardia (HCC) 04/23/2017   CAD (coronary artery disease)    non obstructive   Cardiac pacemaker in situ - [employer] Jude 05/13/2014   Permanent pacemaker for second-degree heart block. Procedure complicated by an atrial lead dislodgment and repeat procedure, 12/2013    Carotid stenosis    Carotid US  5/16:  Bilateral ICA 1-39%; L vertebral retrograde; L BP 126/49, R BP 140/57   Chronic combined systolic and diastolic CHF (congestive heart failure) (HCC)    Chronic lower back pain    Colon cancer (HCC)    a. s/p chemo   Colon polyps    Diabetic peripheral neuropathy (HCC)    DVT (deep venous thrombosis) (HCC) 2013   twice behind knee on left side (06/25/2013)   Glaucoma    History of chicken pox    Hyperlipidemia    Hypertension    Hypothyroidism    Iron deficiency anemia    Multinodular goiter    Osteoporosis    PAF (paroxysmal atrial fibrillation) (HCC)    Sleep apnea    Type II diabetes mellitus (HCC)     Social History:  Social History   Socioeconomic History   Marital status: Married    Spouse name: Elsie   Number of children: 0   Years of education: Not on file   Highest education level: Not on file  Occupational History   Occupation: Retired    Comment: office work  Tobacco Use   Smoking status: Every Day    Current packs/day: 0.00    Average packs/day: 1 pack/day for 30.0 years (30.0 ttl pk-yrs)    Types: Cigarettes    Start date: 02/1987    Last attempt to quit: 02/2017    Years since quitting: 7.2   Smokeless tobacco: Never   Tobacco comments:    started in her [age] until 2009   Vaping Use   Vaping status: Never Used   Substance and Sexual Activity   Alcohol use: Not Currently    Alcohol/week: 0.0 standard drinks of alcohol   Drug use: No   Sexual activity: Not on file  Other Topics Concern   Not on file  Social History Narrative   Married to husband, Elsie   No children   Retired Hospital doctor to Electrical engineer   Recently moved back here from [place]     Social Drivers of Corporate investment banker Strain: Low Risk  (12/01/2017)   Overall Financial Resource Strain (CARDIA)    Difficulty of Paying Living Expenses: Not very hard  Food Insecurity: Low Risk  (04/06/2024)   Received from [facility]   Hunger Vital Sign    Within the past 12 months, you worried that your food would run out before you got money to buy more: Never true    Within the  past 12 months, the food you bought just didn't last and you didn't have money to get more. : Never true  Transportation Needs: No Transportation Needs (04/06/2024)   Received from [facility]    In the past 12 months, has lack of reliable transportation kept you from medical appointments, meetings, work or from getting things needed for daily living? : No  Physical Activity: Inactive (12/01/2017)   Exercise Vital Sign    Days of Exercise per Week: 0 days    Minutes of Exercise per Session: 0 min  Stress: No Stress Concern Present (05/23/2019)   [employer] of Occupational Health - Occupational Stress Questionnaire    Feeling of Stress : Not at all  Social Connections: Unknown (08/25/2022)   Received from [facility]   Social Network    Social Network: Not on file  Intimate Partner Violence: Not At Risk (12/18/2023)   Received from [facility]   HITS    Over the last 12 months how often did your partner physically hurt you?: Never    Over the last 12 months how often did your partner insult you or talk down to you?: Never    Over the last 12 months how often did your partner threaten you with physical  harm?: Never    Over the last 12 months how often did your partner scream or curse at you?: Never    Medications:   Current Outpatient Medications on File Prior to Visit  Medication Sig Dispense Refill   albuterol  (VENTOLIN  HFA) 108 (90 Base) MCG/ACT inhaler Inhale 1-2 puffs into the lungs every 6 (six) hours as needed for wheezing or shortness of breath. (Patient not taking: Reported on 02/20/2024) 18 g 0   apixaban  (ELIQUIS ) 5 MG TABS tablet Take 1 tablet (5 mg total) by mouth 2 (two) times daily. 180 tablet 1   atorvastatin  (LIPITOR ) 80 MG tablet TAKE 1 TABLET(80 MG) BY MOUTH DAILY 90 tablet 0   baclofen  (LIORESAL ) 10 MG tablet Take 1 tablet (10 mg total) by mouth daily as needed for muscle spasms. (Patient not taking: Reported on 02/20/2024) 30 each 0   cephALEXin  (KEFLEX ) 500 MG capsule Take 2 capsules (1,000 mg total) by mouth 2 (two) times daily. (Patient not taking: Reported on 02/20/2024) 20 capsule 0   Cyanocobalamin  (VITAMIN B-12) 5000 MCG SUBL Place 5,000 mcg under the tongue daily.     empagliflozin  (JARDIANCE ) 10 MG TABS tablet Take 1 tablet (10 mg total) by mouth daily before breakfast.     HYDROcodone -acetaminophen  (NORCO/VICODIN) 5-325 MG tablet Take 1 tablet by mouth every 6 (six) hours as needed. (Patient not taking: Reported on 02/20/2024) 30 tablet 0   insulin  glargine (LANTUS ) 100 UNIT/ML injection Inject 20 Units into the skin in the morning. Sliding Scale     insulin  lispro (HUMALOG) 100 UNIT/ML injection Inject 4 Units into the skin 4 (four) times daily as needed for high blood sugar. If blood sugar over 200.     levothyroxine  (SYNTHROID ) 137 MCG tablet Take 1 tablet (137 mcg total) by mouth daily. 90 tablet 3   magnesium  gluconate (MAGONATE) 500 MG tablet Take 500 mg by mouth daily.     metoprolol  tartrate (LOPRESSOR ) 25 MG tablet TAKE 1 TABLET BY MOUTH 2 TIMES A DAY 60 tablet 11   nitroGLYCERIN  (NITROSTAT ) 0.4 MG SL tablet Place 1 tablet (0.4 mg total) under the tongue  every 5 (five) minutes as needed for chest pain. 25 tablet 2  Potassium Chloride  ER 20 MEQ TBCR Take 1 tablet (20 mEq total) by mouth 2 (two) times daily. 360 tablet 3   spironolactone  (ALDACTONE ) 25 MG tablet TAKE 1/2 TABLET BY MOUTH DAILY **MUST KEEP APRIL APPOINTMENT FOR FURTHER REFILLS. FINAL ATTEMPT** 45 tablet 3   ticagrelor  (BRILINTA ) 90 MG TABS tablet Take 90 mg by mouth daily.     torsemide  (DEMADEX ) 20 MG tablet If you weigh 200 pounds or less, take 20 mg twice a day. If you weigh over 200 pounds, take 40 mg in the morning and 20 mg in the afternoon 90 tablet 11   Vitamin D , Ergocalciferol , (DRISDOL ) 1.25 MG (50000 UNIT) CAPS capsule TAKE 1 CAPSULE BY MOUTH EVERY 7 DAYS 12 capsule 0   No current facility-administered medications on file prior to visit.    Allergies:   Allergies  Allergen Reactions   Other Other (See Comments)    NO Blind Scopes with Naso Gastric tube.  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 2009 Also, a (name not recalled) chemo med caused PERMANENT NEUROPATHY and affected sense of taste   Adhesive [Tape]     Tape - burns - pls use paper tape or elastic bandage (Glue)   Aspirin  Other (See Comments)    S/P gastric bypass surgery, states her MD told her to not take Aspirin .   Latex     Tape= burns   Nsaids Other (See Comments)    S/P gastric bypass-told not to take. GI bleeds with NSAIDS    Physical Exam General: Mildly obese pleasant elderly Caucasian lady seated, in no evident distress Head: head normocephalic and atraumatic.   Neck: supple with no carotid or supraclavicular bruits Cardiovascular: regular rate and rhythm, no murmurs Musculoskeletal: no deformity except kyphoscoliosis Skin:  no rash/petichiae Vascular:  Normal pulses all extremities  Neurologic Exam Mental Status: Awake and fully alert. Oriented to place and time. Recent and remote memory intact. Attention span, concentration and fund of knowledge appropriate. Mood and affect appropriate.  Cranial  Nerves: Fundoscopic exam reveals sharp disc margins. Pupils equal, briskly reactive to light. Extraocular movements full without nystagmus. Visual fields full to confrontation. Hearing intact. Facial sensation intact. Face, tongue, palate moves normally and symmetrically.  Motor: Normal bulk and tone. Normal strength in all tested extremity muscles. Sensory.: intact to touch , pinprick , position and vibratory sensation.  Coordination: Rapid alternating movements normal in all extremities. Finger-to-nose and heel-to-shin performed accurately bilaterally. Gait and Station: Arises from chair with  difficulty. Stance is stooped.  Uses a cane and walks with a slow cautious gait with favoring left knee.  Unable to test tandem walking Reflexes: 1+ and symmetric. Toes downgoing.   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  Vascular risk factors of diabetes, hypertension, hyperlipidemia, mild obesity, coronary artery disease and paroxysmal A-fib     PLAN:  - Continue Eliquis , aspirin  81 mg daily and atorvastatin  for secondary stroke prevention manage/prescribed by PCP/cardiology - Continue close PCP and cardiology follow-up for aggressive stroke risk factor management including BP goal<130/90, HLD with LDL goal<70 and DM with A1c.<7     I had a long d/w patient and her husband about recurrent episodes of headaches with transient vision loss and confusion possibly representing vertebral basilar TIAs given significant subclavian and vertebral artery stenosis,, risk for recurrent stroke/TIAs, personally independently reviewed imaging studies  and stroke evaluation results and answered questions.Continue Eliquis  (apixaban ) daily and add aspirin  80 mg daily for secondary stroke prevention and  maintain strict control of hypertension with blood pressure goal below 130/90, diabetes with hemoglobin A1c goal below 6.5% and lipids with LDL cholesterol goal below 70 mg/dL. I also advised the patient to eat a healthy diet with plenty of whole grains, cereals, fruits and vegetables, exercise regularly and maintain ideal body weight .check lipid profile, hemoglobin A1c, MRI scan of the brain with and without contrast and diagnostic catheter cerebral angiogram to look if she has stentable lesion to treat.  Followup in the future with me in 3 to 4 months or call earlier if necessary.   I personally spent a total of *** minutes in the care of the patient today including {Time Based Coding:210964241}.  Harlene Bogaert, AGNP-BC  [facility] [address] [place], [place] [address]  Phone [phone] Fax [phone] Note: This document was prepared with digital dictation and possible smart phrase technology. Any transcriptional errors that result from this process are unintentional.

## 2024-05-21 NOTE — Progress Notes (Signed)
 Remote PPM Transmission

## 2024-05-22 ENCOUNTER — Encounter: Payer: Self-pay | Admitting: Adult Health

## 2024-05-22 ENCOUNTER — Ambulatory Visit: Admitting: Adult Health

## 2024-05-30 ENCOUNTER — Ambulatory Visit (HOSPITAL_COMMUNITY)
Admission: RE | Admit: 2024-05-30 | Discharge: 2024-05-30 | Disposition: A | Discharge status: Home / Self Care | Source: Ambulatory Visit | Attending: Cardiology | Admitting: Cardiology

## 2024-05-30 DIAGNOSIS — R202 Paresthesia of skin: Secondary | ICD-10-CM | POA: Insufficient documentation

## 2024-05-30 DIAGNOSIS — R200 Anesthesia of skin: Secondary | ICD-10-CM | POA: Diagnosis present

## 2024-05-30 DIAGNOSIS — I771 Stricture of artery: Secondary | ICD-10-CM | POA: Insufficient documentation

## 2024-06-04 ENCOUNTER — Ambulatory Visit: Payer: Self-pay | Admitting: Cardiology

## 2024-06-05 ENCOUNTER — Encounter: Payer: Self-pay | Admitting: Cardiology

## 2024-06-15 ENCOUNTER — Ambulatory Visit: Admitting: Internal Medicine

## 2024-06-20 ENCOUNTER — Encounter: Payer: Self-pay | Admitting: Cardiology

## 2024-06-21 ENCOUNTER — Other Ambulatory Visit: Payer: Self-pay | Admitting: Cardiovascular Disease

## 2024-07-20 ENCOUNTER — Telehealth (HOSPITAL_COMMUNITY): Payer: Self-pay | Admitting: Student

## 2024-07-20 ENCOUNTER — Other Ambulatory Visit (HOSPITAL_COMMUNITY): Payer: Self-pay | Admitting: Radiology

## 2024-07-20 DIAGNOSIS — Z9889 Other specified postprocedural states: Secondary | ICD-10-CM

## 2024-07-20 NOTE — Telephone Encounter (Signed)
 Patient with a history of left subclavian artery stenosis s/p stent-assisted angioplasty with Dr. Dolphus 07/06/23. The patient has not followed up with our team since her procedure and Dr. Dolphus is no longer with this practice. Our team has been receiving refill requests for Brilinta .   I called the patient today and left her a voice message notifying her that Dr. Dolphus is no longer with our practice but I have initiated a referral to one of the new Neuro Interventional Radiologists with [facility]. She has been referred to see Dr. Lester.   There is also conflicting information in the chart about her Brilinta  dose - once daily versus twice daily. I asked her to call us  back and let us  know what her current dose is so that we can send in a Brilinta  refill to get her through until she meets with Dr. Lester.   The number to the IR APP office at [facility] was left on the voice message and she was asked to please call our team back.   Warren Dais, AGACNP-BC 07/20/2024, 1:47 PM

## 2024-07-31 NOTE — Progress Notes (Signed)
 HOSPITAL MEDICINE -  PROGRESS NOTE  Hospital Day: #1   Admission Date:  07/29/2024   PCP:  Morton Benjamen Esmeralda PONCE, PA-C   Extended Emergency Contact Information Primary Emergency Contact: Balzarini,william Home Phone: [phone] Mobile Phone: [phone] Relation: Spouse   Hospital Course: Mary [name] is a 83 y.o. female with sPMH complete heart block s/p PPM, CAD, DM, HLD, HTN, hypothyroidism, pAF, CHF, COPD, MDD, OSA, OA, tobacco use who presented to the ED for fall. Per report, the patient was walking at home on 11/21 when she fell and hit her chest and knees on the ground. She was found to have sternal fracture on CT imaging. 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 She was started on Zosyn  while urine culture was pending. Trauma consulted, no indication for acute surgical intervention.   Discharge Readiness:  Timeframe: 24H Dispo to: SNF Barriers to Discharge: Urine culture pending, abx plan  Subjective   Patient seen and examined at bedside, husband is present. She reports sternal pain which has worsened. States after lidocaine  patch was taken off yesterday it started to get worse, lidocaine  patch just recently re-applied. Denies shortness of breath, abdominal pain, vomiting, leg swelling.  Patient reports last BM was 3-4 days ago, also states she is not passing gas. She denies abdominal pain. Has some nausea due to sternal pain, no vomiting. Will give lactulose today.  Assessment/Plan   Assessment & Plan Emphysematous cystitis Patient presents for mechanical fall found to have c/f emphysematous cystitis on CTAP. UA consistent with infection. Urology placed foley in ED and signed off. Will c/w broad spectrum abx's tonight given emphysematous nature. --PLAN:  - C/w zosyn  - F/u U cx, blood cultures - de-escalate antibiotics accordingly - Per urology, Recommend Foley catheter to drainage. Pending clinical improvement,  catheter should be removed prior to discharge with trial of void.  - Daily CBC, BMP Closed fracture of sternum Mechanical Fall Patient had mechanic fall at home with subsequent fracture of sternum. Trauma will follow, no surgical interventions planned. Wiill work on pain control, PT OT, and dietician eval in setting of likely malnutrition and weakness  -Lidocaine  patches, low dose oxycodone   CT Head:  No acute intracranial abnormality.    CT Cervical Spine:  No evidence of acute fracture or traumatic malalignment of the cervical spine.    CT Chest Abdomen Pelvis:  1.  No acute traumatic visceral findings within the chest, abdomen, or pelvis. 2.  Acute, minimally displaced buckle fracture of the upper sternal body. No retrosternal hematoma or evidence of acute aortic injury. 3.  Intraluminal and possibly intramural bladder gas raising concern for emphysematous cystitis. 4.  Numerous scattered solid and part solid nodules throughout the lungs favored infectious/inflammatory in etiology. Short interval follow-up chest CT can be considered. 5.  Moderate rectal stool burden with presacral stranding which can be seen with stercoral colitis. 6.  Diffuse body wall edema. 7.  Spine evaluated separately on same-day CT reformats.   CT Thoracic Lumbar Spine:  1.  No evidence of acute fracture or traumatic malalignment of the thoracic or lumbar spine. 2.  Please reference the CT of the chest, abdomen, and pelvis for characterization of all structures outside the thoracic and lumbar spine. Plan: Incentive spirometry Trauma to follow PT consulted and recommends rehab OT consulted and recommends rehab Malnutrition (CMD) Patient reports unintentional weight loss over the past 6 months RD consulted: [facility] Stercoral colitis Plan: Bowel regimen with miralax  and docusenna No BM  yet, trial lactulose today Consider dulcolax suppository  Incidental pulmonary nodules CT chest abd pelvis showed:   Numerous scattered solid and part solid nodules throughout the lungs favored infectious/inflammatory in etiology.  Plan: Short interval follow-up chest CT can be considered. Type 2 diabetes mellitus (CMD) -Home insulin : Lantus  20u, Humalog sliding scale -Lantus  dose decreased to 10u, continue for now -SSI Chronic heart failure with preserved ejection fraction (HFpEF) (CMD) The patient has evidence of chronic heart failure without evidence of an acute exacerbation. Will monitor intake and output closely and treat as indicated. -Continue metoprolol  -Home Jardiance , spironolactone  and torsemide  held as patient appeared hypovolemic on admission     DVT Prophylaxis: Fully Anticoagulated  Active Medications: acetaminophen , 1,000 mg, oral, Q8H apixaban , 5 mg, oral, BID flu vaccine, 0.5 mL, intramuscular, During hospitalization insulin  glargine, 10 Units, subcutaneous, QAM insulin  aspart (NovoLOG )/insulin  lispro (HumaLOG) injection, 0-10 Units, subcutaneous, With meals & at bedtime levothyroxine , 137 mcg, oral, QAM lidocaine , 2 patch, topical, Daily metoprolol  tartrate, 25 mg, oral, BID nicotine , 1 patch, transdermal, Daily piperacillin -tazobactam, 3.375 g, intravenous, Q8H polyethylene glycol, 17 g, oral, BID sennosides-docusate sodium, 1 tablet, oral, At Bedtime [Held by provider] spironolactone , 12.5 mg, oral, Daily ticagrelor , 90 mg, oral, BID [Held by provider] torsemide , 20 mg, oral, BID   Infusions:   Objective   Temp:  [97.2 F (36.2 C)-97.8 F (36.6 C)] 97.3 F (36.3 C) Heart Rate:  [71-80] 74 Resp:  [17-20] 17 BP: (102-125)/(78-83) 102/79    Physical Exam Constitutional:      General: She is not in acute distress.    Appearance: She is not ill-appearing or toxic-appearing.  Cardiovascular:     Rate and Rhythm: Normal rate and regular rhythm.     Pulses: Normal pulses.     Heart sounds: No murmur heard. Pulmonary:     Effort: Pulmonary effort is normal. No  respiratory distress.     Breath sounds: Normal breath sounds. No wheezing, rhonchi or rales.  Abdominal:     General: Abdomen is flat. Bowel sounds are normal. There is no distension.     Palpations: Abdomen is soft.     Tenderness: There is abdominal tenderness (epigastric area).  Musculoskeletal:     Right lower leg: No edema.     Left lower leg: No edema.  Skin:    General: Skin is warm and dry.     Coloration: Skin is not jaundiced or pale.  Neurological:     Mental Status: She is alert.  Psychiatric:        Mood and Affect: Mood normal.        Behavior: Behavior normal.      Labs   Results from last 2 days  Lab Units 07/31/24 0332 07/30/24 0326  WHITE BLOOD CELL COUNT 10*3/uL 5.70 5.00  HEMOGLOBIN g/dL 89.9* 89.1*  HEMATOCRIT % 31.2* 34.2*  PLATELET COUNT 10*3/uL 420 466*    Results from last 2 days  Lab Units 07/31/24 0332 07/30/24 0326  SODIUM mmol/L 131* 133*  POTASSIUM mmol/L 4.3 4.1  CHLORIDE mmol/L 96* 96*  CO2 mmol/L 27 28  BUN mg/dL 22 19  CREATININE mg/dL 8.81 8.93  GLUCOSE mg/dL 50* 885*  CALCIUM  mg/dL 8.4* 8.4*    Results from last 2 days  Lab Units 07/31/24 0332 07/30/24 0326  MAGNESIUM  mg/dL 1.8* 1.9      Lynda Glade Lauth, PA-C

## 2024-08-01 NOTE — Consults (Signed)
 Nutrition Note   PATIENT NAME: Mary [name] DATE: 08/01/2024  TIME: 1:35 PM  Note Type: Follow-up (high f/u brief note) Referral Reason: Nutrition consult  Assessment  83 y.o. female with sPMH complete heart block s/p PPM, CAD, DM, HLD, HTN, hypothyroidism, pAF, CHF, COPD, MDD, OSA, OA, tobacco use presenting to the ED for fall. Overall pt feeling well this afternoon. Tolerating PO. Recent intake 100% o meals. However pt reports that she will sometimes forget to order meals. Would like to have meals automatically sent up to room. RD to adjust order. Denies N/V. Denies abdominal pain. Recent BG 50 - 136 mg/dl.   Interventions  Nutrition Intervention: General healthful diet, Medical food supplements, Modify diet Continue current diet order Glucerna once daily RD adjusting Nutrition Screen to have pt receive automatic trays per pt request     Estimated Needs  Nutrition Needs Based On: IBW (61.4 kg)    Calories (kcal/day): 8157-7850  Calories based on: 30-35 kcal/kg  Protein (g/day): 74-92  Protein based on: 1.2-1.5g/kg, IBW  Fluid (mL/day):   1-1.1 mL/kcal    Follow-up Information  Follow-Up Date: 08/08/24 Follow-Up Reason: Reassessment   Contact RD on treatment team. After hours or weekends, use secure chat group: [facility]  [facility] [place]  [facility]  [facility] Dietitians [facility]  [facility] Dietitians [facility]  [facility]   Fairy Katz [place], [place] 08/01/2024 1:35 PM

## 2024-08-20 ENCOUNTER — Telehealth: Payer: Self-pay | Admitting: Internal Medicine

## 2024-08-20 NOTE — Telephone Encounter (Signed)
 Returned call to Pt's husband.  Advised that they both need to get their remote monitors and plug them in where they are sleeping.  All questions answered.

## 2024-08-20 NOTE — Telephone Encounter (Signed)
 Patient husband states that his wife is in rehab right now after a recent hospitalization. Her pacemaker is not being monitored while in rehab and wants to make sure that we can get transmissions while she is there in case anything alarming happens. He is also in the hospital and will be going to rehab as well and will need to make sure his defib is being monitored - I will send another note on him.

## 2024-08-27 ENCOUNTER — Ambulatory Visit

## 2024-08-27 DIAGNOSIS — I480 Paroxysmal atrial fibrillation: Secondary | ICD-10-CM | POA: Diagnosis not present

## 2024-08-28 ENCOUNTER — Other Ambulatory Visit: Payer: Self-pay

## 2024-08-28 ENCOUNTER — Encounter (HOSPITAL_COMMUNITY): Payer: Self-pay

## 2024-08-28 ENCOUNTER — Emergency Department (HOSPITAL_COMMUNITY)
Admission: EM | Admit: 2024-08-28 | Discharge: 2024-09-03 | Disposition: A | Discharge status: Home / Self Care | Attending: Emergency Medicine | Admitting: Emergency Medicine

## 2024-08-28 ENCOUNTER — Ambulatory Visit: Payer: Self-pay | Admitting: Internal Medicine

## 2024-08-28 DIAGNOSIS — I509 Heart failure, unspecified: Secondary | ICD-10-CM | POA: Insufficient documentation

## 2024-08-28 DIAGNOSIS — Z91040 Latex allergy status: Secondary | ICD-10-CM | POA: Insufficient documentation

## 2024-08-28 DIAGNOSIS — I110 Hypertensive heart disease with heart failure: Secondary | ICD-10-CM | POA: Diagnosis not present

## 2024-08-28 DIAGNOSIS — Z7901 Long term (current) use of anticoagulants: Secondary | ICD-10-CM | POA: Diagnosis not present

## 2024-08-28 DIAGNOSIS — Z79899 Other long term (current) drug therapy: Secondary | ICD-10-CM | POA: Insufficient documentation

## 2024-08-28 DIAGNOSIS — D649 Anemia, unspecified: Secondary | ICD-10-CM | POA: Insufficient documentation

## 2024-08-28 DIAGNOSIS — Z85038 Personal history of other malignant neoplasm of large intestine: Secondary | ICD-10-CM | POA: Insufficient documentation

## 2024-08-28 DIAGNOSIS — E039 Hypothyroidism, unspecified: Secondary | ICD-10-CM | POA: Insufficient documentation

## 2024-08-28 DIAGNOSIS — R627 Adult failure to thrive: Secondary | ICD-10-CM | POA: Diagnosis present

## 2024-08-28 DIAGNOSIS — J449 Chronic obstructive pulmonary disease, unspecified: Secondary | ICD-10-CM | POA: Insufficient documentation

## 2024-08-28 DIAGNOSIS — E871 Hypo-osmolality and hyponatremia: Secondary | ICD-10-CM | POA: Diagnosis not present

## 2024-08-28 LAB — URINALYSIS, ROUTINE W REFLEX MICROSCOPIC
Bilirubin Urine: NEGATIVE
Glucose, UA: NEGATIVE mg/dL
Hgb urine dipstick: NEGATIVE
Ketones, ur: NEGATIVE mg/dL
Nitrite: NEGATIVE
Protein, ur: 100 mg/dL — AB
Specific Gravity, Urine: 1.015 (ref 1.005–1.030)
pH: 6 (ref 5.0–8.0)

## 2024-08-28 LAB — CUP PACEART REMOTE DEVICE CHECK
Battery Remaining Longevity: 59 mo
Battery Remaining Percentage: 73 %
Battery Voltage: 2.98 V
Brady Statistic AP VP Percent: 80 %
Brady Statistic AP VS Percent: 1 %
Brady Statistic AS VP Percent: 20 %
Brady Statistic AS VS Percent: 1 %
Brady Statistic RA Percent Paced: 81 %
Brady Statistic RV Percent Paced: 99 %
Date Time Interrogation Session: 20251229175921
Implantable Lead Connection Status: 753985
Implantable Lead Connection Status: 753985
Implantable Lead Implant Date: 20150506
Implantable Lead Implant Date: 20150506
Implantable Lead Location: 753859
Implantable Lead Location: 753860
Implantable Pulse Generator Implant Date: 20231218
Lead Channel Impedance Value: 350 Ohm
Lead Channel Impedance Value: 400 Ohm
Lead Channel Pacing Threshold Amplitude: 1 V
Lead Channel Pacing Threshold Amplitude: 1 V
Lead Channel Pacing Threshold Pulse Width: 0.5 ms
Lead Channel Pacing Threshold Pulse Width: 0.5 ms
Lead Channel Sensing Intrinsic Amplitude: 0.3 mV
Lead Channel Sensing Intrinsic Amplitude: 9.2 mV
Lead Channel Setting Pacing Amplitude: 2.5 V
Lead Channel Setting Pacing Amplitude: 2.5 V
Lead Channel Setting Pacing Pulse Width: 0.5 ms
Lead Channel Setting Sensing Sensitivity: 4 mV
Pulse Gen Model: 2272
Pulse Gen Serial Number: 8128617

## 2024-08-28 LAB — BASIC METABOLIC PANEL WITH GFR
Anion gap: 8 (ref 5–15)
BUN: 28 mg/dL — ABNORMAL HIGH (ref 8–23)
CO2: 29 mmol/L (ref 22–32)
Calcium: 7.3 mg/dL — ABNORMAL LOW (ref 8.9–10.3)
Chloride: 96 mmol/L — ABNORMAL LOW (ref 98–111)
Creatinine, Ser: 1.2 mg/dL — ABNORMAL HIGH (ref 0.44–1.00)
GFR, Estimated: 45 mL/min — ABNORMAL LOW
Glucose, Bld: 126 mg/dL — ABNORMAL HIGH (ref 70–99)
Potassium: 3.8 mmol/L (ref 3.5–5.1)
Sodium: 132 mmol/L — ABNORMAL LOW (ref 135–145)

## 2024-08-28 LAB — CBC WITH DIFFERENTIAL/PLATELET
Abs Immature Granulocytes: 0.01 K/uL (ref 0.00–0.07)
Basophils Absolute: 0 K/uL (ref 0.0–0.1)
Basophils Relative: 1 %
Eosinophils Absolute: 0 K/uL (ref 0.0–0.5)
Eosinophils Relative: 0 %
HCT: 26.3 % — ABNORMAL LOW (ref 36.0–46.0)
Hemoglobin: 8.5 g/dL — ABNORMAL LOW (ref 12.0–15.0)
Immature Granulocytes: 0 %
Lymphocytes Relative: 18 %
Lymphs Abs: 0.8 K/uL (ref 0.7–4.0)
MCH: 27.1 pg (ref 26.0–34.0)
MCHC: 32.3 g/dL (ref 30.0–36.0)
MCV: 83.8 fL (ref 80.0–100.0)
Monocytes Absolute: 0.4 K/uL (ref 0.1–1.0)
Monocytes Relative: 9 %
Neutro Abs: 3.3 K/uL (ref 1.7–7.7)
Neutrophils Relative %: 72 %
Platelets: 285 K/uL (ref 150–400)
RBC: 3.14 MIL/uL — ABNORMAL LOW (ref 3.87–5.11)
RDW: 22.6 % — ABNORMAL HIGH (ref 11.5–15.5)
WBC: 4.6 K/uL (ref 4.0–10.5)
nRBC: 0 % (ref 0.0–0.2)

## 2024-08-28 LAB — URINALYSIS, MICROSCOPIC (REFLEX)

## 2024-08-28 MED ORDER — BACLOFEN 10 MG PO TABS: 10.0000 mg | ORAL_TABLET | Freq: Every day | ORAL | Status: DC | PRN

## 2024-08-28 MED ORDER — ALBUTEROL SULFATE HFA 108 (90 BASE) MCG/ACT IN AERS: 1.0000 | INHALATION_SPRAY | Freq: Four times a day (QID) | RESPIRATORY_TRACT | Status: DC | PRN

## 2024-08-28 MED ORDER — EMPAGLIFLOZIN 25 MG PO TABS
25.0000 mg | ORAL_TABLET | Freq: Every day | ORAL | Status: DC
  Administered 2024-08-29 – 2024-09-02 (×5): 25 mg via ORAL
  Filled 2024-08-28 (×8): qty 1

## 2024-08-28 MED ORDER — ATORVASTATIN CALCIUM 80 MG PO TABS
80.0000 mg | ORAL_TABLET | Freq: Every day | ORAL | Status: DC
  Administered 2024-08-29 – 2024-09-03 (×6): 80 mg via ORAL
  Filled 2024-08-28: qty 2
  Filled 2024-08-28: qty 1
  Filled 2024-08-28: qty 2
  Filled 2024-08-28: qty 1
  Filled 2024-08-28: qty 2
  Filled 2024-08-28: qty 1

## 2024-08-28 MED ORDER — SPIRONOLACTONE 12.5 MG HALF TABLET
12.5000 mg | ORAL_TABLET | Freq: Every day | ORAL | Status: DC
  Administered 2024-08-29 – 2024-09-03 (×6): 12.5 mg via ORAL
  Filled 2024-08-28 (×6): qty 1

## 2024-08-28 MED ORDER — TICAGRELOR 90 MG PO TABS
90.0000 mg | ORAL_TABLET | Freq: Every day | ORAL | Status: DC
  Administered 2024-08-29 – 2024-08-31 (×3): 90 mg via ORAL
  Filled 2024-08-28 (×3): qty 1

## 2024-08-28 MED ORDER — MAGNESIUM GLUCONATE 500 (27 MG) MG PO TABS
500.0000 mg | ORAL_TABLET | Freq: Every day | ORAL | Status: DC
  Administered 2024-08-29 – 2024-09-02 (×5): 500 mg via ORAL
  Filled 2024-08-28 (×8): qty 1

## 2024-08-28 MED ORDER — PANTOPRAZOLE SODIUM 20 MG PO TBEC
20.0000 mg | DELAYED_RELEASE_TABLET | Freq: Every day | ORAL | Status: DC
  Administered 2024-08-30 – 2024-09-03 (×5): 20 mg via ORAL
  Filled 2024-08-28 (×6): qty 1

## 2024-08-28 MED ORDER — APIXABAN 2.5 MG PO TABS
2.5000 mg | ORAL_TABLET | Freq: Two times a day (BID) | ORAL | Status: DC
  Administered 2024-08-28 – 2024-09-03 (×11): 2.5 mg via ORAL
  Filled 2024-08-28 (×12): qty 1

## 2024-08-28 MED ORDER — METOPROLOL TARTRATE 25 MG PO TABS
25.0000 mg | ORAL_TABLET | Freq: Two times a day (BID) | ORAL | Status: DC
  Administered 2024-08-28 – 2024-09-03 (×10): 25 mg via ORAL
  Filled 2024-08-28 (×12): qty 1

## 2024-08-28 MED ORDER — SUCRALFATE 1 G PO TABS
1.0000 g | ORAL_TABLET | Freq: Four times a day (QID) | ORAL | Status: DC
  Administered 2024-08-28 – 2024-09-03 (×15): 1 g via ORAL
  Filled 2024-08-28 (×17): qty 1

## 2024-08-28 MED ORDER — POTASSIUM CHLORIDE CRYS ER 20 MEQ PO TBCR
20.0000 meq | EXTENDED_RELEASE_TABLET | Freq: Two times a day (BID) | ORAL | Status: DC
  Administered 2024-08-28 – 2024-09-03 (×11): 20 meq via ORAL
  Filled 2024-08-28 (×11): qty 1
  Filled 2024-08-28: qty 2

## 2024-08-28 MED ORDER — INSULIN GLARGINE 100 UNIT/ML ~~LOC~~ SOLN: 20.0000 [IU] | Freq: Every morning | SUBCUTANEOUS | Status: DC

## 2024-08-28 MED ORDER — TORSEMIDE 20 MG PO TABS
20.0000 mg | ORAL_TABLET | Freq: Every day | ORAL | Status: DC
  Administered 2024-08-29 – 2024-09-03 (×6): 20 mg via ORAL
  Filled 2024-08-28 (×6): qty 1

## 2024-08-28 MED ORDER — INSULIN ASPART 100 UNIT/ML IJ SOLN
0.0000 [IU] | Freq: Three times a day (TID) | INTRAMUSCULAR | Status: DC
  Administered 2024-09-02: 2 [IU] via SUBCUTANEOUS
  Filled 2024-08-28: qty 2

## 2024-08-28 MED ORDER — POLYETHYLENE GLYCOL 3350 17 G PO PACK: 17.0000 g | PACK | Freq: Every day | ORAL | Status: DC | PRN

## 2024-08-28 MED ORDER — LEVOTHYROXINE SODIUM 25 MCG PO TABS
137.0000 ug | ORAL_TABLET | Freq: Every day | ORAL | Status: DC
  Administered 2024-08-29 – 2024-09-03 (×6): 137 ug via ORAL
  Filled 2024-08-28 (×6): qty 1

## 2024-08-28 NOTE — ED Notes (Signed)
 KENDALL [phone]

## 2024-08-28 NOTE — [place] FL2 (Signed)
 " [place]  MEDICAID FL2 LEVEL OF CARE FORM     IDENTIFICATION  Patient Name: Mary [name] Birthdate: 1940-11-14 Sex: female Admission Date (Current Location): 08/28/2024  [place] and [place] Number:  [employer] and Address:  [facility] [place] [employer] [facility], [address], [place], [place] [address]      Provider Number: [id number]  Attending Physician Name and Address:  Pamella Ozell LABOR, DO  Relative Name and Phone Number:  Joury Allcorn (spouse) [phone]    Current Level of Care: Hospital Recommended Level of Care: Skilled Nursing Facility Prior Approval Number:    Date Approved/Denied:   PASRR Number: [id number]  Discharge Plan: SNF    Current Diagnoses: Patient Active Problem List   Diagnosis Date Noted   Preop cardiovascular exam 02/21/2024   S/P endovascular aneurysm repair 07/06/2023   Subclavian artery stenosis, left 07/06/2023   Pacemaker-mediated tachycardia 12/05/2022   Weakness 12/05/2022   Chemotherapy-induced neuropathy 09/28/2022   CKD (chronic kidney disease) stage 3, GFR 30-59 ml/min (HCC) 09/28/2022   Mixed stress and urge urinary incontinence 09/28/2022   Hx of malignant neoplasm of colon 02/15/2022   Iron deficiency anemia, unspecified 12/09/2021   B12 deficiency 07/03/2021   Right hand pain 10/05/2019   OSA (obstructive sleep apnea) 06/07/2018   COPD (chronic obstructive pulmonary disease) (HCC) 05/24/2018   Chronic diastolic congestive heart failure, NYHA class 2 (HCC) 05/24/2018   Osteoarthritis 05/25/2017   Atrial tachycardia 04/23/2017   Second degree AV block, Mobitz type II    Depression 08/02/2016   Tobacco abuse 08/02/2016   Right lumbar radiculitis 06/09/2016   Tear of medial meniscus of right knee, current 04/22/2016   Chronic venous insufficiency 02/21/2015   Hx of gastric bypass 02/20/2015   Cardiac pacemaker in situ - St Jude 05/13/2014   Atrioventricular block, complete (HCC) 01/02/2014   Chronic  anticoagulation 10/09/2013   PAF (paroxysmal atrial fibrillation) (HCC) 08/29/2013   Obesity 05/05/2013   Coronary artery disease involving native coronary artery of native heart without angina pectoris    Diabetes mellitus with complication (HCC)    Hypothyroidism    Hypertension    Hyperlipidemia associated with type 2 diabetes mellitus (HCC)     Orientation RESPIRATION BLADDER Height & Weight     Self, Time, Situation, Place  Normal  (Depends) Weight:   Height:     BEHAVIORAL SYMPTOMS/MOOD NEUROLOGICAL BOWEL NUTRITION STATUS      Continent Diet (See D/C Summary)  AMBULATORY STATUS COMMUNICATION OF NEEDS Skin   Extensive Assist Verbally Normal                       Personal Care Assistance Level of Assistance  Bathing, Feeding, Dressing Bathing Assistance: Maximum assistance Feeding assistance: Independent Dressing Assistance: Maximum assistance     Functional Limitations Info  Sight, Hearing, Speech Sight Info: Adequate Hearing Info: Adequate Speech Info: Adequate    SPECIAL CARE FACTORS FREQUENCY  PT (By licensed PT), OT (By licensed OT)     PT Frequency: 5x/week OT Frequency: 5x/week            Contractures Contractures Info: Not present    Additional Factors Info  Code Status, Allergies Code Status Info: Prior Allergies Info: Other, Adhesive (Tape), Aspirin , Latex, Nsaids           Current Medications (08/28/2024):  This is the current hospital active medication list No current facility-administered medications for this encounter.   Current Outpatient Medications  Medication  Sig Dispense Refill   albuterol  (VENTOLIN  HFA) 108 (90 Base) MCG/ACT inhaler Inhale 1-2 puffs into the lungs every 6 (six) hours as needed for wheezing or shortness of breath. (Patient not taking: Reported on 02/20/2024) 18 g 0   apixaban  (ELIQUIS ) 5 MG TABS tablet Take 1 tablet (5 mg total) by mouth 2 (two) times daily. 180 tablet 1   atorvastatin  (LIPITOR ) 80 MG tablet  TAKE 1 TABLET(80 MG) BY MOUTH DAILY 90 tablet 0   baclofen  (LIORESAL ) 10 MG tablet Take 1 tablet (10 mg total) by mouth daily as needed for muscle spasms. (Patient not taking: Reported on 02/20/2024) 30 each 0   cephALEXin  (KEFLEX ) 500 MG capsule Take 2 capsules (1,000 mg total) by mouth 2 (two) times daily. (Patient not taking: Reported on 02/20/2024) 20 capsule 0   Cyanocobalamin  (VITAMIN B-12) 5000 MCG SUBL Place 5,000 mcg under the tongue daily.     empagliflozin  (JARDIANCE ) 10 MG TABS tablet Take 1 tablet (10 mg total) by mouth daily before breakfast.     HYDROcodone -acetaminophen  (NORCO/VICODIN) 5-325 MG tablet Take 1 tablet by mouth every 6 (six) hours as needed. (Patient not taking: Reported on 02/20/2024) 30 tablet 0   insulin  glargine (LANTUS ) 100 UNIT/ML injection Inject 20 Units into the skin in the morning. Sliding Scale     insulin  lispro (HUMALOG) 100 UNIT/ML injection Inject 4 Units into the skin 4 (four) times daily as needed for high blood sugar. If blood sugar over 200.     levothyroxine  (SYNTHROID ) 137 MCG tablet Take 1 tablet (137 mcg total) by mouth daily. 90 tablet 3   magnesium  gluconate (MAGONATE) 500 MG tablet Take 500 mg by mouth daily.     metoprolol  tartrate (LOPRESSOR ) 25 MG tablet TAKE 1 TABLET BY MOUTH 2 TIMES A DAY 60 tablet 11   nitroGLYCERIN  (NITROSTAT ) 0.4 MG SL tablet Place 1 tablet (0.4 mg total) under the tongue every 5 (five) minutes as needed for chest pain. 25 tablet 2   Potassium Chloride  ER 20 MEQ TBCR Take 1 tablet (20 mEq total) by mouth 2 (two) times daily. 360 tablet 3   spironolactone  (ALDACTONE ) 25 MG tablet TAKE 1/2 TABLET BY MOUTH DAILY 45 tablet 2   ticagrelor  (BRILINTA ) 90 MG TABS tablet Take 90 mg by mouth daily.     torsemide  (DEMADEX ) 20 MG tablet If you weigh 200 pounds or less, take 20 mg twice a day. If you weigh over 200 pounds, take 40 mg in the morning and 20 mg in the afternoon 90 tablet 11   Vitamin D , Ergocalciferol , (DRISDOL ) 1.25 MG  (50000 UNIT) CAPS capsule TAKE 1 CAPSULE BY MOUTH EVERY 7 DAYS 12 capsule 0     Discharge Medications: Please see discharge summary for a list of discharge medications.  Relevant Imaging Results:  Relevant Lab Results:   Additional Information SSN#: [id number]; spouse also hospitalized, will need placed together  Bernardino Dean, LCSWA     "

## 2024-08-28 NOTE — Progress Notes (Signed)
 Mary [name] is a 83 yo f who presents to the ED from home for placement. TOC consult received. Patient known to this SW from working with patient's spouse who is also hospitalized. Patient was at home alone and spouse was concerned for safety. She was ultimately brought to the ED.   CSW met with patient at bedside in hallway here in the ED. She shares she has had numerous hospitalizations and SNF admissions since falling around a month ago. She states she was discharged home from [facility] SNF yesterday. Her spouse, Gerlene, recently discharged from the hospital and declined a SNF stay for himself to support her when she got home. Unfortunately, he had functional deficits and she was not as ambulatory as they and hoped. She was unable to get out of recliner or have depends changed. Reports issues with knees buckling. Spouse is fairly independent but has had limiting health issues. Limited family support, sister-in-law is local but not available for care giving role. Spouse working with [facility] and [facility] care agency on more long-term and intermediate supports, but both patient and spouse are in agreement that they will both need SNF placement upon discharge from the hospital. Hope to be placed together. Spouse, Phill, is admitted. Patient will likely remain TOC boarder.   Brief education on SNF process in ED setting provided. Briefly discussed barriers, including recent SNF admission and obtaining an insurance authorization. FL2 was completed. Will await PT/OT prior to sending referrals.

## 2024-08-28 NOTE — ED Notes (Signed)
 Pt stated she could not provide a urine sample at the moment.

## 2024-08-28 NOTE — ED Provider Notes (Signed)
 "  EMERGENCY DEPARTMENT AT [facility] Provider Note   CSN: [id number] Arrival date & time: 08/28/24  1736     Patient presents with: Failure To Thrive   Mary [name] is a 83 y.o. female.   Patient is an 83 year old female with past medical history of hypertension, hyperlipidemia, CHF, paroxysmal A-fib, COPD, colon cancer, hypothyroidism, etc.  Patient was brought to emergency department for failure to thrive.  Her husband is here in the emergency department due to being unable to take care of himself or the patient, his wife.  He was recently discharged from the hospital and having cardiac problems.  He is unable to ambulate to clean, get dressed, cook, or even hold his phone at this time.  He requested a social care work consult for SNF placement here in the ED who contacted his wife's family.  They are unable to get to the house to take care of her.  She is wheelchair-bound and has dementia.  She was brought to the emergency department by EMS for SNF placement.  She denies any pain or concerning signs or symptoms at this time.  The history is provided by the patient. No language interpreter was used.       Prior to Admission medications  Medication Sig Start Date End Date Taking? Authorizing Provider  albuterol  (VENTOLIN  HFA) 108 (90 Base) MCG/ACT inhaler Inhale 1-2 puffs into the lungs every 6 (six) hours as needed for wheezing or shortness of breath. Patient not taking: Reported on 02/20/2024 10/22/21   Maranda Jamee Jacob, MD  apixaban  (ELIQUIS ) 5 MG TABS tablet Take 1 tablet (5 mg total) by mouth 2 (two) times daily. 04/18/23   Waddell Danelle ORN, MD  atorvastatin  (LIPITOR ) 80 MG tablet TAKE 1 TABLET(80 MG) BY MOUTH DAILY 09/17/19   Rollene Almarie LABOR, MD  baclofen  (LIORESAL ) 10 MG tablet Take 1 tablet (10 mg total) by mouth daily as needed for muscle spasms. Patient not taking: Reported on 02/20/2024 02/25/22   Rollene Almarie LABOR, MD  cephALEXin  (KEFLEX ) 500 MG  capsule Take 2 capsules (1,000 mg total) by mouth 2 (two) times daily. Patient not taking: Reported on 02/20/2024 07/23/23   Robinson, John K, PA-C  Cyanocobalamin  (VITAMIN B-12) 5000 MCG SUBL Place 5,000 mcg under the tongue daily.    [provider]  empagliflozin  (JARDIANCE ) 10 MG TABS tablet Take 1 tablet (10 mg total) by mouth daily before breakfast. 04/05/22   Claudene Victory ORN, MD  HYDROcodone -acetaminophen  (NORCO/VICODIN) 5-325 MG tablet Take 1 tablet by mouth every 6 (six) hours as needed. Patient not taking: Reported on 02/20/2024 09/27/22   Rollene Almarie LABOR, MD  insulin  glargine (LANTUS ) 100 UNIT/ML injection Inject 20 Units into the skin in the morning. Sliding Scale    [provider]  insulin  lispro (HUMALOG) 100 UNIT/ML injection Inject 4 Units into the skin 4 (four) times daily as needed for high blood sugar. If blood sugar over 200.    [provider]  levothyroxine  (SYNTHROID ) 137 MCG tablet Take 1 tablet (137 mcg total) by mouth daily. 02/18/22   Rollene Almarie LABOR, MD  magnesium  gluconate (MAGONATE) 500 MG tablet Take 500 mg by mouth daily.    [provider]  metoprolol  tartrate (LOPRESSOR ) 25 MG tablet TAKE 1 TABLET BY MOUTH 2 TIMES A DAY 05/09/24   Waddell Danelle ORN, MD  nitroGLYCERIN  (NITROSTAT ) 0.4 MG SL tablet Place 1 tablet (0.4 mg total) under the tongue every 5 (five) minutes as needed for  chest pain. 05/16/24   Anner Alm ORN, MD  Potassium Chloride  ER 20 MEQ TBCR Take 1 tablet (20 mEq total) by mouth 2 (two) times daily. 07/14/23   Croitoru, Mihai, MD  spironolactone  (ALDACTONE ) 25 MG tablet TAKE 1/2 TABLET BY MOUTH DAILY 06/25/24   Anner Alm ORN, MD  ticagrelor  (BRILINTA ) 90 MG TABS tablet Take 90 mg by mouth daily. 10/27/23   [provider]  torsemide  (DEMADEX ) 20 MG tablet If you weigh 200 pounds or less, take 20 mg twice a day. If you weigh over 200 pounds, take 40 mg in the morning and 20 mg in the afternoon 05/11/23    Croitoru, Mihai, MD  Vitamin D , Ergocalciferol , (DRISDOL ) 1.25 MG (50000 UNIT) CAPS capsule TAKE 1 CAPSULE BY MOUTH EVERY 7 DAYS 04/12/22   Rollene Almarie LABOR, MD    Allergies: Other, Adhesive [tape], Aspirin , Latex, and Nsaids    Review of Systems  Constitutional:  Negative for chills and fever.  HENT:  Negative for ear pain and sore throat.   Eyes:  Negative for pain and visual disturbance.  Respiratory:  Negative for cough and shortness of breath.   Cardiovascular:  Negative for chest pain and palpitations.  Gastrointestinal:  Negative for abdominal pain and vomiting.  Genitourinary:  Negative for dysuria and hematuria.  Musculoskeletal:  Negative for arthralgias and back pain.  Skin:  Negative for color change and rash.  Neurological:  Negative for seizures and syncope.  All other systems reviewed and are negative.   Updated Vital Signs BP 99/70 (BP Location: Left Arm)   Pulse 73   Temp 98.1 F (36.7 C)   Resp 16   SpO2 100%   Physical Exam Vitals and nursing note reviewed.  Constitutional:      General: She is not in acute distress.    Appearance: She is well-developed.  HENT:     Head: Normocephalic and atraumatic.  Eyes:     Conjunctiva/sclera: Conjunctivae normal.  Cardiovascular:     Rate and Rhythm: Normal rate and regular rhythm.     Heart sounds: No murmur heard. Pulmonary:     Effort: Pulmonary effort is normal. No respiratory distress.     Breath sounds: Normal breath sounds.  Abdominal:     Palpations: Abdomen is soft.     Tenderness: There is no abdominal tenderness.  Musculoskeletal:        General: No swelling.     Cervical back: Neck supple.  Skin:    General: Skin is warm and dry.     Capillary Refill: Capillary refill takes less than 2 seconds.  Neurological:     Mental Status: She is alert.  Psychiatric:        Mood and Affect: Mood normal.     (all labs ordered are listed, but only abnormal results are displayed) Labs Reviewed  CBC  WITH DIFFERENTIAL/PLATELET - Abnormal; Notable for the following components:      Result Value   RBC 3.14 (*)    Hemoglobin 8.5 (*)    HCT 26.3 (*)    RDW 22.6 (*)    All other components within normal limits  BASIC METABOLIC PANEL WITH GFR - Abnormal; Notable for the following components:   Sodium 132 (*)    Chloride 96 (*)    Glucose, Bld 126 (*)    BUN 28 (*)    Creatinine, Ser 1.20 (*)    Calcium  7.3 (*)    GFR, Estimated 45 (*)    All  other components within normal limits  URINALYSIS, ROUTINE W REFLEX MICROSCOPIC - Abnormal; Notable for the following components:   Protein, ur 100 (*)    Leukocytes,Ua SMALL (*)    All other components within normal limits  URINALYSIS, MICROSCOPIC (REFLEX) - Abnormal; Notable for the following components:   Bacteria, UA MANY (*)    All other components within normal limits    EKG: Dual paced rhythm.  Radiology: CUP PACEART REMOTE DEVICE CHECK Result Date: 08/28/2024 PPM Scheduled remote reviewed. Normal device function.  Presenting rhythm: AP/VP, undersensing of AF/AFL Known AF, controlled rates, Eliquis  per PA report, undersensing has been address by clinic and industry Next remote transmission per protocol. LA, CVRS    Procedures   Medications Ordered in the ED - No data to display                                  Medical Decision Making Amount and/or Complexity of Data Reviewed Labs: ordered.   Patient brought into the ED after her primary caretaker, her husband, was recommended for SNF placement.  Her family was contacted by her social care worker and no one was able to take care of her.  She was brought to the ED by EMS for SNF placement.  She denies any signs or symptoms at this time.  Basic labs are stable.  She appears to have chronic hyponatremia.  Some anemia possibly secondary to chronic kidney disease.  Patient smelled of urine-had a depends on.  UA collected to rule out urinary tract infection.  UA negative for  UTI.  Stable for SNF placement.  Social work consult placed and PT OT eval placed.     Final diagnoses:  Failure to thrive in adult  Chronic hyponatremia  Anemia, unspecified type    ED Discharge Orders     None          Elnor Bernarda SQUIBB, DO 08/28/24 2111  "

## 2024-08-28 NOTE — ED Triage Notes (Signed)
 Pt arrives via EMS from home. EMS States she is living in unsafe living conditions. EMS reports Law enforcement is currently at patient's home doing an APS report. Pt arrives AxOx4. She states she is too weak to get around the house, otherwise, she denies any complaints. Pt does have strong odor to her urine. BP 95/64 during triage.

## 2024-08-28 NOTE — ED Provider Triage Note (Signed)
 Emergency Medicine Provider Triage Evaluation Note  Mary [name] , a 83 y.o. female  was evaluated in triage.  Pt complains of failure to thrive. Patient was brought in by EMS after her husband was brought to ED for failure to thrive, failure to ambulate/take care of himself. We attempted to reach out to family to see if anyone could come take care of the patient, Mary [name] who is bedbound-wheel chair bound and has dementia. Her husband is unable to walk now due to deconditioning after recent hospitalization and seeking SNF placement for both of them.   Pt has no current complaints.  Review of Systems  Positive: Fatigue x months Negative:   Physical Exam  BP 95/64   Pulse 72   Temp 97.8 F (36.6 C)   Resp 18   SpO2 98%  Gen:   Awake, no distress   Resp:  Normal effort  MSK:   Moves extremities without difficulty  Other:    Medical Decision Making  Medically screening exam initiated at 6:14 PM.  Appropriate orders placed.  Mary [name] was informed that the remainder of the evaluation will be completed by another provider, this initial triage assessment does not replace that evaluation, and the importance of remaining in the ED until their evaluation is complete.     Elnor Bernarda SQUIBB, DO 08/28/24 [phone]

## 2024-08-29 ENCOUNTER — Other Ambulatory Visit (HOSPITAL_COMMUNITY): Payer: Self-pay

## 2024-08-29 ENCOUNTER — Telehealth (HOSPITAL_COMMUNITY): Payer: Self-pay

## 2024-08-29 LAB — CBG MONITORING, ED
Glucose-Capillary: 103 mg/dL — ABNORMAL HIGH (ref 70–99)
Glucose-Capillary: 96 mg/dL (ref 70–99)
Glucose-Capillary: 136 mg/dL — ABNORMAL HIGH (ref 70–99)

## 2024-08-29 NOTE — ED Notes (Signed)
 Pt refused nightly meds d/t this RN told pt that I couldn't just sit the meds on her table and leave them.

## 2024-08-29 NOTE — Telephone Encounter (Signed)
 Pharmacy Patient Advocate Encounter  Insurance verification completed.    The patient is insured through [facility]. Patient has Medicare and is not eligible for a copay card, but may be able to apply for patient assistance or Medicare RX Payment Plan (Patient Must reach out to their plan, if eligible for payment plan), if available.    Ran test claim for Eliquis  5mg  tablet and the current 30 day co-pay is $176.39.  Ran test claim for Brilinta  90mg  tablet and the current 30 day co-pay is $38.13.  This test claim was processed through [facility]- copay amounts may vary at other pharmacies due to pharmacy/plan contracts, or as the patient moves through the different stages of their insurance plan.

## 2024-08-29 NOTE — Progress Notes (Signed)
 Received update from nursing that the patient's spouse does NOT want to go to the same SNF. Met with spouse at bedside upstairs. After thinking  and talking it over, he wants to focus on his own rehab. Feels he will be very stressed if they share the same space. Offered to place in same facility but in different rooms, he declined. The patient does not yet know. Discussed with spouse that he should be the one to tell her. They both have their phones with them and have been talking frequently.   Met with patient at bedside in ED. Reviewed accepting facilities on the HUB. She selects [facility], having just been there a few days ago. However, upon returning about an hour later, patient did not remember our conversation. Discussed SNF decision with spouse at his bedside, he was also agreeable for patient to go to [facility]. Unfortunately, by this point in the night, admissions for [facility] was gone and unable to confirm admission for 1/2.   CSW attempted to start auth via [employer] for [facility] 1/2. Received error message: This date is outside the patient's eligibility. Hoping that patient's eligibility will restart tomorrow with the new year. Will sign out to AM coverage to coordinate admission and attempt auth.

## 2024-08-29 NOTE — Progress Notes (Signed)
 CSW spoke with Donnamarie at [facility], who confirmed the facility can accommodate pt and husband in the same room. SNF referral faxed. Awaiting bed offers.

## 2024-08-29 NOTE — ED Notes (Signed)
 Pt cleaned of urinary incontinence. Pt moved onto hospital bed for comfort. New linens. Pt refusing hospital gown.

## 2024-08-29 NOTE — ED Notes (Addendum)
 Pt refusing glucose check, meds and food at this time. Says trying to get in touch with her husband is more important. Pt keeps trying to call her husband.

## 2024-08-29 NOTE — Progress Notes (Signed)
 Remote PPM Transmission

## 2024-08-29 NOTE — ED Provider Notes (Signed)
 Emergency Medicine Observation Re-evaluation Note  Mary [name] is a 83 y.o. female, seen on rounds today.  Pt initially presented to the ED for complaints of general weakness. Pt too weak to be at home (and spouse in hospital/SNF), not able to perform ADLs. No new c/o this AM.   Physical Exam  BP 99/70 (BP Location: Left Arm)   Pulse 73   Temp 98.1 F (36.7 C)   Resp 16   SpO2 100%  Physical Exam General: calm, resting.  Cardiac: regular rate. Lungs: breathing comfortably.   ED Course / MDM    I have reviewed the labs performed to date as well as medications administered while in observation.  Recent changes in the last 24 hours include ed obs, reassessment.  Plan  Current plan is for [facility] placement to SNF.    Ellia Knowlton, MD 08/29/24 [phone]

## 2024-08-29 NOTE — Evaluation (Signed)
 Physical Therapy Evaluation Patient Details Name: Mary [name] MRN: [id number] DOB: July 05, 1941 Today's Date: 08/29/2024  History of Present Illness  Pt is 83 year old presented to [facility] on  08/28/24 for weakness and FTT. Pt just home from SNF stay and husband unable to care for and he is in hospital at this time as well. Pt with fall with sternal fx in early December and likely GI bleed in mid December.  PMH - dementia, pacer, DM 2, hyperlipidemia, COPD, CKD stage III, MDD, HFrEF, and history of colon cancer  Clinical Impression  Pt admitted with above diagnosis and presents to PT with functional limitations due to deficits listed below (See PT problem list). Pt needs skilled PT to maximize independence and safety. Pt was in SNF most of December after a fall with sternal fx. Had just returned home but unable to get out of chair. Pt reports she hasn't been able to stand and walk due to bil knee pain due to arthritis and also hit knees when she fell. Husband unable to care for pt as he is also in hospital and is too weak to manage on his own. Believe pt could progress with possible scoot transfers to become more independent as well as work on standing as per PT notes from mid December pt was standing with assist. Patient will benefit from continued inpatient follow up therapy, <3 hours/day.           If plan is discharge home, recommend the following: Two people to help with walking and/or transfers;A lot of help with bathing/dressing/bathroom   Can travel by private vehicle   No    Equipment Recommendations None recommended by PT  Recommendations for Other Services       Functional Status Assessment Patient has had a recent decline in their functional status and/or demonstrates limited ability to make significant improvements in function in a reasonable and predictable amount of time     Precautions / Restrictions Precautions Precautions: Fall Recall of Precautions/Restrictions:  Impaired Restrictions Weight Bearing Restrictions Per Provider Order: No      Mobility  Bed Mobility Overal bed mobility: Needs Assistance Bed Mobility: Rolling, Sidelying to Sit, Sit to Sidelying Rolling: Min assist, Used rails Sidelying to sit: Mod assist, Used rails, HOB elevated     Sit to sidelying: Mod assist, Used rails General bed mobility comments: Assist to elevate trunk into sitting and bring hips to EOB, Assist to bring legs back up into bed    Transfers                   General transfer comment: Pt declined to attempt with 1 person assist. Scooted laterally on EOB with min assist    Ambulation/Gait                  Stairs            Wheelchair Mobility     Tilt Bed    Modified Rankin (Stroke Patients Only)       Balance Overall balance assessment: Needs assistance Sitting-balance support: No upper extremity supported, Feet supported Sitting balance-Leahy Scale: Fair                                       Pertinent Vitals/Pain Pain Assessment Pain Assessment: Faces Faces Pain Scale: Hurts little more Pain Location: sternum with lying flat, Bil knees Pain Descriptors /  Indicators: Grimacing, Guarding, Moaning Pain Intervention(s): Limited activity within patient's tolerance, Monitored during session, Repositioned    Home Living Family/patient expects to be discharged to:: Skilled nursing facility Living Arrangements: Spouse/significant other Available Help at Discharge: Family;Available 24 hours/day Type of Home: Apartment           Home Equipment: Rolling Walker (2 wheels);Rollator (4 wheels);Cane - single point;Toilet riser      Prior Function Prior Level of Function : Needs assist       Physical Assist : Mobility (physical);ADLs (physical) Mobility (physical): Bed mobility;Transfers   Mobility Comments: Pt reports she has been nonambulatory since fall in early Dec. Assist for transfers to w/c at  SNF ADLs Comments: Assist for bathing, dressing, toileting     Extremity/Trunk Assessment   Upper Extremity Assessment Upper Extremity Assessment: Generalized weakness    Lower Extremity Assessment Lower Extremity Assessment: Generalized weakness       Communication   Communication Communication: No apparent difficulties    Cognition Arousal: Alert Behavior During Therapy: WFL for tasks assessed/performed   PT - Cognitive impairments: No family/caregiver present to determine baseline, Memory, Problem solving                       PT - Cognition Comments: Decr short term memory Following commands: Impaired       Cueing Cueing Techniques: Verbal cues, Tactile cues     General Comments      Exercises     Assessment/Plan    PT Assessment Patient needs continued PT services  PT Problem List Decreased strength;Decreased activity tolerance;Decreased balance;Decreased mobility;Pain       PT Treatment Interventions DME instruction;Functional mobility training;Therapeutic activities;Therapeutic exercise;Balance training;Patient/family education    PT Goals (Current goals can be found in the Care Plan section)  Acute Rehab PT Goals Patient Stated Goal: go to facility PT Goal Formulation: With patient Time For Goal Achievement: 09/12/24 Potential to Achieve Goals: Good    Frequency Min 1X/week     Co-evaluation               AM-PAC PT 6 Clicks Mobility  Outcome Measure Help needed turning from your back to your side while in a flat bed without using bedrails?: A Little Help needed moving from lying on your back to sitting on the side of a flat bed without using bedrails?: A Lot Help needed moving to and from a bed to a chair (including a wheelchair)?: Total Help needed standing up from a chair using your arms (e.g., wheelchair or bedside chair)?: Total Help needed to walk in hospital room?: Total Help needed climbing 3-5 steps with a railing? :  Total 6 Click Score: 9    End of Session   Activity Tolerance: Patient tolerated treatment well Patient left: in bed (bed in hallway) Nurse Communication: Mobility status PT Visit Diagnosis: Other abnormalities of gait and mobility (R26.89);Muscle weakness (generalized) (M62.81);History of falling (Z91.81);Difficulty in walking, not elsewhere classified (R26.2);Pain Pain - part of body: Knee (bil knees and sternum)    Time: 9074-9049 PT Time Calculation (min) (ACUTE ONLY): 25 min   Charges:   PT Evaluation $PT Eval Moderate Complexity: 1 Mod PT Treatments $Therapeutic Activity: 8-22 mins PT General Charges $$ ACUTE PT VISIT: 1 Visit         [facility] PT Acute Rehabilitation Services Office [phone]   Rodgers ORN [facility] 08/29/2024, 11:00 AM

## 2024-08-29 NOTE — ED Notes (Signed)
 Help get patient cleaned up changed brief moved patient over to a hospital bed

## 2024-08-30 LAB — CBG MONITORING, ED
Glucose-Capillary: 87 mg/dL (ref 70–99)
Glucose-Capillary: 99 mg/dL (ref 70–99)
Glucose-Capillary: 134 mg/dL — ABNORMAL HIGH (ref 70–99)

## 2024-08-30 NOTE — ED Notes (Signed)
 Pt awake and asking for milk, given milk. Denies any other needs.

## 2024-08-30 NOTE — ED Notes (Signed)
 Pt cleaned of moderate amount of stool and wet brief.  Clean dry brief and chux placed.  Pt repositioned in bed, blankets provided for c/o being cold.  Pt voice no other c/o at this time.

## 2024-08-30 NOTE — ED Notes (Signed)
 The patient's husband, Manus, is requesting that she call him at [phone]. RN made aware of request.

## 2024-08-30 NOTE — ED Provider Notes (Signed)
 Emergency Medicine Observation Re-evaluation Note  Mary [name] is a 84 y.o. female, seen on rounds today.  Pt initially presented to the ED for complaints of Failure To Thrive Currently, the patient is resting with no new complaints.  Physical Exam  BP 122/67 (BP Location: Right Arm)   Pulse 78   Temp 97.8 F (36.6 C) (Oral)   Resp 16   SpO2 98%  Physical Exam General: Resting Cardiac: Regular rate and rhythm Lungs: No respiratory distress oxygen saturations 98%   ED Course / MDM  EKG:EKG Interpretation Date/Time:  Tuesday August 28 2024 18:57:23 EST Ventricular Rate:  72 PR Interval:  216 QRS Duration:  156 QT Interval:  490 QTC Calculation: 536 R Axis:   266  Text Interpretation: Poor data quality, interpretation may be adversely affected AV dual-paced rhythm with prolonged AV conduction with occasional Premature ventricular complexes Abnormal ECG When compared with ECG of 20-Feb-2024 08:04, PREVIOUS ECG IS PRESENT Confirmed by Elnor Hila ([id number]) on 08/28/2024 9:10:36 PM  I have reviewed the labs performed to date as well as medications administered while in observation.  Recent changes in the last 24 hours include patient continues under etiologies and has had reassessments with no significant changes.  Plan  Current plan is for [facility] placement to SNF.    Levander Houston, MD 08/30/24 [phone]

## 2024-08-30 NOTE — ED Provider Notes (Signed)
 Emergency Medicine Observation Re-evaluation Note  Mary [name] is a 84 y.o. female, seen on rounds today.  Pt initially presented to the ED for complaints of general weakness and needing SNF placement. No new c/o this AM  Physical Exam  BP 122/67 (BP Location: Right Arm)   Pulse 78   Temp 97.8 F (36.6 C) (Oral)   Resp 16   SpO2 98%  Physical Exam General: resting. Cardiac: regular rate. Lungs: breathing comfortably   ED Course / MDM    I have reviewed the labs performed to date as well as medications administered while in observation.  Recent changes in the last 24 hours include ED obs, reassessment.  Plan  Current plan is for [facility] placement to SNF.    Bernard Drivers, MD 08/30/24 0700

## 2024-08-30 NOTE — Progress Notes (Signed)
 CSW unable to start ins auth. Pending [employer] ins update, in order to start auth. CSW to start ins auth for [facility] on 1/2.

## 2024-08-31 LAB — CBG MONITORING, ED
Glucose-Capillary: 144 mg/dL — ABNORMAL HIGH (ref 70–99)
Glucose-Capillary: 113 mg/dL — ABNORMAL HIGH (ref 70–99)
Glucose-Capillary: 107 mg/dL — ABNORMAL HIGH (ref 70–99)

## 2024-08-31 NOTE — Progress Notes (Addendum)
 Ins auth started, [facility] notified.   Update- Pt is in co-pay days, pt and spouse notified. Spouse is trying to gather funds.

## 2024-08-31 NOTE — ED Notes (Signed)
 Changed pt linen and clothes. Washed up and new linens provided. Placed new purewick.

## 2024-08-31 NOTE — Progress Notes (Signed)
 Per request, met with patient's spouse Phill at his bedside upstairs. A long discussion was held. We reviewed the ongoing efforts to place patient at SNF and make arrangements for more long-term accommodations. He states that his nephew has a call scheduled with the business office at [facility] to address copay barrier. He shares he is working with a acupuncturist to further explore possibilities of both in-home supports and assisted living. Is exploring payor sources through the [place] (unsure how service connected he is, did serve in [place]) and through [place]. Talked to the advisor earlier today. Phill shares significant experiences of stress and anxiety. Shares he feels overwhelmed by everything, especially in the context of his own medical situation. Encouragement, active listening, empathy, and support provided. TOC following.

## 2024-08-31 NOTE — ED Notes (Signed)
 Changed brief and chux, pt repositioned for comfort. Denies any current needs.

## 2024-08-31 NOTE — ED Notes (Signed)
 To room to change wet linens and brief and replace pur wick.  Pt states I can't change it right now, you will have to wait.

## 2024-08-31 NOTE — ED Provider Notes (Signed)
 Emergency Medicine Observation Re-evaluation Note  Mary [name] is a 84 y.o. female, seen on rounds today.  Pt initially presented to the ED for complaints of Failure To Thrive Currently, the patient is resting.  Physical Exam  BP 113/76   Pulse 78   Temp 98.1 F (36.7 C) (Oral)   Resp 16   SpO2 96%  Physical Exam General: nad  ED Course / MDM  EKG:EKG Interpretation Date/Time:  Tuesday August 28 2024 18:57:23 EST Ventricular Rate:  72 PR Interval:  216 QRS Duration:  156 QT Interval:  490 QTC Calculation: 536 R Axis:   266  Text Interpretation:  Poor data quality, interpretation may be adversely affected AV dual-paced rhythm with prolonged AV conduction with occasional Premature ventricular complexes Abnormal ECG When compared with ECG of 20-Feb-2024 08:04, PREVIOUS ECG IS PRESENT Confirmed by Elnor Hila ([id number]) on 08/28/2024 9:10:36 PM  I have reviewed the labs performed to date as well as medications administered while in observation.  Recent changes in the last 24 hours include no significant change overnight.  Plan  Current plan is for placement to SNF.    Elnor Jayson LABOR, DO 08/31/24 [phone]

## 2024-09-01 LAB — CBG MONITORING, ED
Glucose-Capillary: 96 mg/dL (ref 70–99)
Glucose-Capillary: 100 mg/dL — ABNORMAL HIGH (ref 70–99)
Glucose-Capillary: 103 mg/dL — ABNORMAL HIGH (ref 70–99)

## 2024-09-01 NOTE — ED Notes (Signed)
 PT did not like dinner tray and asked for crackers, peanut butter and a drink. Once these things were give, PT was appreciative

## 2024-09-01 NOTE — ED Provider Notes (Signed)
 Emergency Medicine Observation Re-evaluation Note  Mary [name] is a 84 y.o. female, seen on rounds today.  Pt initially presented to the ED for complaints of Failure To Thrive Currently, the patient is sleeping.  Physical Exam  BP 113/76   Pulse 78   Temp 98.1 F (36.7 C) (Oral)   Resp 16   SpO2 96%  Physical Exam General: Sleeping Cardiac: Extremities well-perfused Lungs: Breathing is unlabored Psych: Deferred  ED Course / MDM  EKG:  I have reviewed the labs performed to date as well as medications administered while in observation.  Recent changes in the last 24 hours include social work met with spouse yesterday.  Plan  Current plan is for placement.    Melvenia Motto, MD 09/01/24 [id number]

## 2024-09-02 LAB — CBG MONITORING, ED
Glucose-Capillary: 93 mg/dL (ref 70–99)
Glucose-Capillary: 152 mg/dL — ABNORMAL HIGH (ref 70–99)
Glucose-Capillary: 115 mg/dL — ABNORMAL HIGH (ref 70–99)
Glucose-Capillary: 139 mg/dL — ABNORMAL HIGH (ref 70–99)

## 2024-09-02 NOTE — Progress Notes (Addendum)
 INS AUTH has been approved CSW has reached out to [facility] N/A left HIPAA VM. ICM will continue to follow.   Addend @ 10:48 AM   CSW also tried the business office # [phone] N/A left HIPAA VM     [place] Pierre Dellarocco LCSW  09/02/2024 10:41 AM

## 2024-09-02 NOTE — ED Provider Notes (Signed)
 Emergency Medicine Observation Re-evaluation Note  Mary [name] is a 84 y.o. female, seen on rounds today.  Pt initially presented to the ED for complaints of Failure To Thrive Currently, the patient is resting  Physical Exam  BP 122/74   Pulse 72   Temp 98 F (36.7 C) (Oral)   Resp 17   SpO2 96%  Physical Exam General: NAD Cardiac: RR Lungs: non labored Psych: calm   ED Course / MDM  EKG:EKG Interpretation Date/Time:  Tuesday August 28 2024 18:57:23 EST Ventricular Rate:  72 PR Interval:  216 QRS Duration:  156 QT Interval:  490 QTC Calculation: 536 R Axis:   266  Text Interpretation: Poor data quality, interpretation may be adversely affected AV dual-paced rhythm with prolonged AV conduction with occasional Premature ventricular complexes Abnormal ECG When compared with ECG of 20-Feb-2024 08:04, PREVIOUS ECG IS PRESENT Confirmed by Elnor Hila ([id number]) on 08/28/2024 9:10:36 PM  I have reviewed the labs performed to date as well as medications administered while in observation.  Recent changes in the last 24 hours include none  Plan  Current plan is for placement    Neysa Caron PARAS, DO 09/02/24 [id number]

## 2024-09-02 NOTE — Progress Notes (Signed)
 CSW spoke to patient husband in regards to SNF. Husband wants HH. CSW made RNCM aware. ICM will continue to follow for next steps. [facility] never answered so patient still has bed offer through facility.   [place] Jerney Baksh LCSW  09/02/2024 3:57 PM

## 2024-09-02 NOTE — ED Notes (Signed)
 This RN and tech changed pt brief and linens.  Peri care performed.  RN provided more warm blankets per pt request.  Pt states she is warm and comfortable at this time.

## 2024-09-02 NOTE — ED Notes (Signed)
 Patient soiled brief and chuck pads. Patient cleaned and brief and pads replaced with clean.  New purewick placed on patient at this time also.

## 2024-09-02 NOTE — ED Notes (Signed)
 Pt woken up to eat breakfast tray. Pt shook head no and laid down

## 2024-09-02 NOTE — TOC Progression Note (Addendum)
 Transition of Care [facility]) - Progression Note    Patient Details  Name: Mary [name] MRN: [id number] Date of Birth: Aug 11, 1941  [facility] CM/SW Contact  Corean JAYSON Canary, RN Phone Number: 09/02/2024, 2:59 PM  Clinical Narrative:     Beatris to Mr Fleener via phone explained Home health care. He states he is in the hospital and stressing out about what to do. Discussed private pay option with aides as he states the patient needs 24/7 care.  He mentioned maximizing HH, will send out in HUB Will send him a list of private pay aide agency from internet, he states that would assist him greatly. He states all the stress is causing some memory retention. There is no-one at the apartment that could take care of her. He states she does not have long term insurance. They have UHC medicare, no medicaid or other insurance.   The patient has not received authorization for [facility] yet ( which is in progress) he is aware though that she is in Co-pay days. He is trying to figure out financially what they could do HH vs SNF. Message sent to [facility] of [facility] for [facility] to assist. Information sent to husbands email per request [email or web address] Will continue to follow                     Expected Discharge Plan and Services                                               Social Drivers of Health (SDOH) Interventions SDOH Screenings   Food Insecurity: Low Risk (08/14/2024)   Received from [facility]  Housing: Low Risk (08/14/2024)   Received from [facility]  Transportation Needs: No Transportation Needs (08/14/2024)   Received from [facility]  Utilities: Low Risk (08/14/2024)   Received from [facility]  Depression (PHQ2-9): Low Risk (09/27/2022)  Financial Resource [name]: Low Risk (08/14/2024)   Received from [facility]  Social Connections: Moderately Isolated (08/14/2024)   Received from [facility]  Tobacco Use: High Risk  (08/28/2024)    Readmission Risk Interventions     No data to display

## 2024-09-03 LAB — CBG MONITORING, ED: Glucose-Capillary: 98 mg/dL (ref 70–99)

## 2024-09-03 NOTE — ED Notes (Signed)
 PTAR called, eta 30 min; informed RN

## 2024-09-03 NOTE — Progress Notes (Signed)
 Bed is ready at [facility], call report [phone] room 131A

## 2024-09-03 NOTE — Discharge Instructions (Addendum)
 Follow-up with your primary care provider.  Return to the ER for any new or worsening symptoms.

## 2024-09-03 NOTE — ED Provider Notes (Addendum)
 Emergency Medicine Observation Re-evaluation Note  Mary [name] is a 84 y.o. female, seen on rounds today.  Pt initially presented to the ED for complaints of Failure To Thrive Currently, the patient is asleep.  Physical Exam  BP 116/71 (BP Location: Right Arm)   Pulse 73   Temp 97.7 F (36.5 C) (Axillary)   Resp 16   SpO2 100%  Physical Exam General: asleep Cardiac: asleep Lungs: asleep Psych: asleep  ED Course / MDM  EKG:EKG Interpretation Date/Time:  Tuesday August 28 2024 18:57:23 EST Ventricular Rate:  72 PR Interval:  216 QRS Duration:  156 QT Interval:  490 QTC Calculation: 536 R Axis:   266  Text Interpretation:  Poor data quality, interpretation may be adversely affected AV dual-paced rhythm with prolonged AV conduction with occasional Premature ventricular complexes Abnormal ECG When compared with ECG of 20-Feb-2024 08:04, PREVIOUS ECG IS PRESENT Confirmed by Elnor Hila ([id number]) on 08/28/2024 9:10:36 PM  I have reviewed the labs performed to date as well as medications administered while in observation.  No recent changes in the last 24 hours.  Plan  Current plan is for placement.    Freddi Hamilton, MD 09/03/24 [phone]  Patient able to be placed by TOC this morning.  Will discharge.   Freddi Hamilton, MD 09/03/24 [id number]

## 2024-10-03 NOTE — H&P (Signed)
 [facility]  ASSESSMENT/PLAN   Active Hospital problems  Patient awaiting placement from the ED, admitted with inability to self-care at home following her rehabilitation discharge following a sternal fracture.  Case management on board.  Self-care deficits/generalized weakness/age related cognitive decline Ambulatory dysfunction Failure to thrive in adult Suspicion for acute superior endplate of vertebral body fractures at T2 and T3. No retropulsion.   - Case management, PT OT on consult for discharge planning >> ongoing >> awaiting placement - Pain management as needed >> currently pain controlled, denies significant pain     UTI/acute cystitis   - Urine culture Klebsiella, patient denies any urinary symptoms - Continue Keflex  to complete a course X 5 days     Hypokalemia >> resolved with supplementation, continue to monitor and replace electrolytes as needed, recheck electrolytes in a.m.     Normocytic anemia >> Keep hemoglobin above 7, monitor H&H, remains at 8.3 with no reports of obvious bleeding, consider holding Eliquis  if persistent drop or any episodes of bleeding, FOBT uncollected, reports of bowel movement noted on chart, continue to monitor, soapsuds as needed ordered for constipation     CKD stage IIIa >> monitor renal function     Lower extremity edema, history of CAD/CHF, anasarca, elevated BNP but does not look grossly volume overloaded except mild lower extremity edema which has improved >>  Diuresis Home diuretic regimen Torsemide /Aldactone  continued, last echocardiogram 9/24 reported EF 55% with concentric left ventricular hypertrophy > BNP remains elevated, likely secondary to underlying CKD, no signs and symptoms of gross volume overload at this time, reports bilateral lower extremity pain >> bilateral lower extremity ultrasound negative for DVT     Constipation/stercoral colitis >> continue bowel regimen and monitor bowel  movements, consider enema as needed     T2DM >> insulin  sliding scale for hyperglycemia management, monitor blood sugar and adjust regimen as needed >>> inpatient blood sugar goal 140-180 >> with blood sugars in the 190s to 200s, Lantus  8 units added at bedtime, continue to monitor blood sugar   Hyperlipidemia >> continue Lipitor    Hypothyroidism >> continue Synthroid    A-fib >> rate controlled, continue Eliquis  for anticoagulation, continue Lopressor  >> need to monitor blood counts and concern for any bleeding with anemia/need to hold Eliquis  with any concerns    Fluid and electrolyte disorder: Euvolemic, present on admission  Plan: Repeat BMP in AM  Abnormal blood counts: Anemia, present on admission Plan: Repeat CBC in AM  Nutritional status: Body mass index is 21.93 kg/m. Failure to thrive Plan: Nutrition consult Consistent Carbohydrate  Coagulation defect: N/A, present on admission Plan: N/A  Elevated LFTs/transaminitis due to: N/A, present on admission Plan: N/A  Debility: Impaired mobility due to medical condition, present on admission Plan: Consult PT  CODE STATUS: Full code  VTE prophylaxis: On Eliquis   Discussed plan of care with consulting physician I have discussed the diagnoses and care plan with the patient and spouse.   Time spent on this admission: 90 minutes    HISTORY  CC: Inability to self-care at home  HPI:   84 year old lady with past medical history of coronary artery disease, heart block status post pacemaker, A-fib on anticoagulation, CHF, T2DM, hypertension, hyperlipidemia, COPD, remote colon cancer presented to ED for generalized fatigue, inability to care for self and perirectal pain initially.  She has been awaiting placement in ED and and as its already been 7 days since she is waiting placement ED team has reached out to us   for admitting the patient and patient will be admitted today on 2/4.   Prior to her ED arrival she was recently  discharged from rehabilitation after being hospitalized for sternal fracture.  She was sent home but was not able to take care of herself and has been waiting for placement with case management on board.  Patient was evaluated this morning, declines any acute issues, denies chest pain, shortness of breath or palpitations, denies any nausea, vomiting. Is having bowel movements now as she reports to me. Did require enema initially.  Hemodynamically stable.   I have reviewed the ED provider's documentation and imaging findings as reported, as below >>   84 year old female with history of atrial fibrillation, CHF, DM presents to the emergency department for generalized fatigue, inability to care for self, and perirectal pain that she attributes to possible bedsores.  She states that she was hospitalized for a sternal fracture, and subsequently discharged to rehab where she was until about a week ago.  She states that she has been home for about 1 week, however has not been able to do much at all.  Mostly sitting in the recliner.  States that she did have a fall after being at home, denies hitting head or passing out.  She states that because of this fall she is scared to walk and has been mostly sitting in her recliner.  She endorses generalized fatigue.  Denies chest pain or shortness of breath.  She endorses anterior chest wall pain that she attributes to her known sternal fracture.  Her main complaint today is pain around her bottom that she attributes to sitting or in her recliner all week.  States that she has been using a bedpan and sitting in diapers.  Notably, she is significantly soiled on exam with stool.  She states that she takes a fluid pill as needed, has not noticed any leg swelling, however she has 2+ bilateral pitting edema on exam. Has been having some dysuria over the past week, no frequency or urgency.       Obtained from chart review >> Mary [name] is an 84 year old female with a  history of coronary artery disease, complete heart block status post pacemaker, paroxysmal atrial fibrillation on anticoagulation, chronic heart failure with preserved ejection fraction, type 2 diabetes mellitus, hypertension, hyperlipidemia, hypothyroidism, chronic obstructive pulmonary disease, obesity, osteoarthritis, and remote stage IIIB colon cancer status post resection and chemotherapy     CT head without contrast on 1/30 reported as follows >> 1. No acute findings. 2. Cerebral atrophy with moderate leukoariaosis. 3. Old LEFT basal ganglia lacunar infarct.   CT C-spine without contrast obtained in 1/30 reported as follows >>   IMPRESSION: 1. No evidence of fracture. 2. Significant lower cervical degenerative changes as above.   CT thoracic lumbar reconstructions obtained in 1/30 reported as follows >> IMPRESSION:  1. Suspicion for acute superior endplate of vertebral body fractures at T2 and T3. No retropulsion. 2. No evidence of fracture throughout the lumbar spine. 3. Moderate degenerative changes across the thoracic mid to lower lumbar spine.   CT chest abdomen and pelvis with IV contrast obtained on 1/30 reported as follows   IMPRESSION: 1.  Small LEFT pleural effusion. 2.  Significant edema involving the stomach with operative changes of gastric bypass. There is moderate edema at the gastrojejunostomy. No free air or free fluid present. Moderate esophagitis. 3.  LEFT renal atrophy. Suspected significant renal artery stenosis. 4.  Constipation and significant rectal impaction. Inflammatory  wall thickening raises suspicion for possible stercoral colitis. 5.  Uterine fibroids. 6.  Urinary bladder wall thickening. Correlate for cystitis. 7.  Anasarca.      Case management on board and patient is currently awaiting placement since 7 days.   Past Medical History:  Diagnosis Date   Atrial fibrillation (*)    AV block    CHF (congestive heart failure) (*)    Colon  cancer (*)    DM (diabetes mellitus) (*)    Past Surgical History:  Procedure Laterality Date   Colon surgery     Colonoscopy     Roux-en-y procedure      Prior to Admission medications  Medication Sig Start Date End Date Taking? Authorizing Provider  acetaminophen  (TYLENOL ) 500 mg tablet Take two tablets (1,000 mg dose) by mouth every 8 (eight) hours as needed. 08/02/24  Yes Historical Provider, MD  apixaban  (ELIQUIS ) 5 mg tablet Take one tablet (5 mg dose) by mouth 2 (two) times daily. 12/18/21   Historical Provider, MD  atorvastatin  (LIPITOR ) 80 mg tablet Take one tablet (80 mg dose) by mouth at bedtime.   Yes Historical Provider, MD  cyanocobalamin  (VITAMIN B-12) 1000 mcg/mL injection  11/04/21   Historical Provider, MD  empagliflozin  (JARDIANCE ) 10 mg TABS tablet Take one tablet (10 mg dose) by mouth. 04/05/22  Yes Historical Provider, MD  ergocalciferol  (VITAMIN D2) 50,000 units CAPS capsule TAKE 1 CAPSULE BY MOUTH EVERY 7 DAYS 04/12/22   Historical Provider, MD  Insulin  Glargine (LANTUS  SOLOSTAR) 100 UNIT/ML SOPN  05/12/22   Historical Provider, MD  insulin  glargine (LANTUS ) 100 Unit/mL injection Inject into the skin daily.    Historical Provider, MD  insulin  lispro (HUMALOG) 100 Units/mL injection Inject into the skin. 12/09/21   Historical Provider, MD  insulin  lispro, 1 Unit Dial, (HUMALOG KWIKPEN) 100 Units/mL SOPN injection PEN Inject zero Units to ten Units into the skin with meals, HS & 0200. Inject 0-10 Units into the skin 4 (four) times daily - before meals and at bedtime. Inject 0-10 Units under the skin 4 (four) times a day with meals and nightly. Units to give if BG < 70: Follow hypoglycemia orders Units to give if BG 70-140: 0 Units to give if BG 141-170: 1 Units to give if BG 171-200: 2 Units to give if BG 201-230: 3 Units to give if BG 231-260: 4 Units to give if BG 261-290: 5 Units to give if BG 291-320: 6 Units to give if BG 321-350: 7 Units to give if BG 351-380: 8 Units to  give if BG >380: 10 Pt NPO: DO NOT HOLD WHILE PT IS NPO 08/08/24   Historical Provider, MD  Lactulose 10 g/15 mL solution  12/20/23   Historical Provider, MD  LAGEVRIO 200 MG capsule SMARTSIG:4 Capsule(s) By Mouth Every 12 Hours 07/26/22   Historical Provider, MD  levothyroxine  sodium (SYNTHROID ,LEVOTHROID,LEVOXYL ) 137 mcg tablet Take by mouth daily. 02/18/22  Yes Historical Provider, MD  magnesium  gluconate 500 mg TABS tablet Take one tablet (500 mg dose) by mouth daily.    Historical Provider, MD  metoprolol  succinate (TOPROL -XL) 25 mg 24 hr tablet TAKE 1/2 TABLETS AT BEDTIME 10/12/22   Historical Provider, MD  metoprolol  tartrate (LOPRESSOR ) 25 mg tablet Take one tablet (25 mg dose) by mouth 2 (two) times daily.    Historical Provider, MD  nicotine  (HABITROL ,NICODERM CQ ) 14 mg/24 hours Place 14 mg onto the skin daily.   Yes Historical Provider, MD  nitroGLYCERIN  (NITROSTAT ) 0.4 mg SL  tablet Place one tablet (0.4 mg dose) under the tongue every 5 (five) minutes as needed. 05/16/24   Historical Provider, MD  omeprazole (PRILOSEC) 20 mg capsule Take one capsule (20 mg dose) by mouth every 12 (twelve) hours. 08/18/24 10/13/24 Yes Historical Provider, MD  potassium chloride  (K-DUR,KLOR-CON ) 20 mEq CR tablet Take two tablets (40 mEq dose) by mouth 2 (two) times daily. 12/21/22   Historical Provider, MD  Potassium Chloride  ER 20 MEQ TBCR Take by mouth. 10/12/22   Historical Provider, MD  spironolactone  (ALDACTONE ) 25 MG tablet Take one half tablet (12.5 mg dose) by mouth daily. 10/12/22  Yes Historical Provider, MD  torsemide  (DEMADEX ) 20 mg tablet Take one tablet (20 mg dose) by mouth 2 (two) times daily. 09/16/22  Yes Historical Provider, MD     Allergies: I reviewed patient allergy list.  Allergies[1]   Short Social History[2] Full Code  Health Care POA/Medical Decision Maker:  Family History[3]     ROS: Positive per HPI otherwise all other systems performed and negative     EXAM  Temp:  [97 F  (36.1 C)-97.9 F (36.6 C)] 97.9 F (36.6 C) Heart Rate:  [70-78] 70 Resp:  [16-18] 18 BP: (110-137)/(62-85) 110/64 SpO2:  [94 %-99 %] 98 %  O2 Device: None (Room air)  Physical Exam  Awake, alert, not in acute distress, bilateral diminished breath sounds, nonlabored respirations, soft abdomen, no significant tenderness, moves all extremities spontaneously, bilateral lower extremity mild edema/tenderness.     RESULTS   Recent Results (from the past 24 hours)  If new NPO order, complete POCT Glucose every 4 hours   Collection Time: 10/02/24 12:09 PM  Result Value Ref Range   Glucose, POC 200 (H) 70 - 99 mg/dL   OPERATOR ID [id number]    INSTRUMENT ID [id number]   POCT Glucose 30  minutes before meals and at bedtime   Collection Time: 10/02/24  4:39 PM  Result Value Ref Range   Glucose, POC 143 (H) 70 - 99 mg/dL   OPERATOR ID [id number]    INSTRUMENT ID [id number]   POCT Glucose 30  minutes before meals and at bedtime   Collection Time: 10/02/24  8:37 PM  Result Value Ref Range   Glucose, POC 194 (H) 70 - 99 mg/dL   OPERATOR ID [id number]    INSTRUMENT ID [id number]     Imaging:  No results found.        [1] Allergies Allergen Reactions   Aspirin  Other    .   Latex Other    Burns   Nsaids Other        Skin Adhesives Redness    Burns   Tolmetin Other       [2] Social History Tobacco Use   Smoking status: Some Days    Types: Cigarettes   Smokeless tobacco: Never  Vaping Use   Vaping status: Never Used  Substance Use Topics   Alcohol use: Not Currently   Drug use: Never  [3] Family History Problem Relation Name Age of Onset   Colon cancer Neg Hx     Colon polyps Neg Hx     Rectal cancer Neg Hx     Stomach cancer Neg Hx

## 2024-11-13 ENCOUNTER — Ambulatory Visit

## 2024-11-26 ENCOUNTER — Ambulatory Visit: Admitting: Neurology

## 2024-11-26 ENCOUNTER — Ambulatory Visit

## 2025-02-12 ENCOUNTER — Ambulatory Visit

## 2025-02-25 ENCOUNTER — Ambulatory Visit

## 2025-05-14 ENCOUNTER — Ambulatory Visit

## 2025-05-27 ENCOUNTER — Ambulatory Visit

## 2025-08-13 ENCOUNTER — Ambulatory Visit

## 2025-08-26 ENCOUNTER — Ambulatory Visit

## 2025-11-12 ENCOUNTER — Ambulatory Visit
